# Patient Record
Sex: Male | Born: 1955 | Race: White | Hispanic: No | Marital: Married | Smoking: Former smoker
Health system: Southern US, Community
[De-identification: ages and names within clinical notes are randomized; demographics above are authoritative.]

## PROBLEM LIST (undated history)

## (undated) DIAGNOSIS — I2510 Atherosclerotic heart disease of native coronary artery without angina pectoris: Secondary | ICD-10-CM

## (undated) DIAGNOSIS — R569 Unspecified convulsions: Secondary | ICD-10-CM

## (undated) DIAGNOSIS — I509 Heart failure, unspecified: Secondary | ICD-10-CM

## (undated) HISTORY — DX: Unspecified convulsions: R56.9

## (undated) HISTORY — PX: ELBOW FRACTURE SURGERY: SHX616

## (undated) HISTORY — PX: OTHER SURGICAL HISTORY: SHX169

---
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
## 1997-11-04 ENCOUNTER — Emergency Department (HOSPITAL_COMMUNITY)
Admission: EM | Admit: 1997-11-04 | Discharge: 1997-11-04 | Payer: PRIVATE HEALTH INSURANCE | Admitting: Emergency Medicine

## 2004-01-19 DIAGNOSIS — I219 Acute myocardial infarction, unspecified: Secondary | ICD-10-CM

## 2004-01-19 HISTORY — DX: Acute myocardial infarction, unspecified: I21.9

## 2004-01-19 HISTORY — PX: CORONARY STENT INTERVENTION: CATH118234

## 2004-05-18 ENCOUNTER — Encounter: Payer: Self-pay | Admitting: Emergency Medicine

## 2004-05-18 ENCOUNTER — Inpatient Hospital Stay (HOSPITAL_COMMUNITY): Admission: EM | Admit: 2004-05-18 | Discharge: 2004-05-22 | Payer: Self-pay | Admitting: Cardiology

## 2004-05-18 ENCOUNTER — Ambulatory Visit: Admission: RE | Admit: 2004-05-18 | Discharge: 2004-05-18 | Payer: Self-pay | Admitting: Cardiology

## 2015-01-19 DIAGNOSIS — G40109 Localization-related (focal) (partial) symptomatic epilepsy and epileptic syndromes with simple partial seizures, not intractable, without status epilepticus: Secondary | ICD-10-CM

## 2015-01-19 HISTORY — DX: Localization-related (focal) (partial) symptomatic epilepsy and epileptic syndromes with simple partial seizures, not intractable, without status epilepticus: G40.109

## 2015-05-05 ENCOUNTER — Telehealth: Payer: Self-pay | Admitting: Diagnostic Neuroimaging

## 2015-05-05 NOTE — Telephone Encounter (Signed)
Dr. Ronnald Ramp called in requesting new consult for patient; 2-3 months memory loss and tremors. I agreed to see patient this week (Friday) or next week. Dr. Ronnald Ramp will send in referral.  Penni Bombard, MD [id number], 0000000 PM Certified in Neurology, Neurophysiology and Neuroimaging  [facility] [address], [place], [place] [address] [phone]

## 2015-05-13 ENCOUNTER — Encounter: Payer: Self-pay | Admitting: *Deleted

## 2015-05-14 ENCOUNTER — Encounter: Payer: Self-pay | Admitting: Diagnostic Neuroimaging

## 2015-05-14 ENCOUNTER — Ambulatory Visit (INDEPENDENT_AMBULATORY_CARE_PROVIDER_SITE_OTHER): Payer: No Typology Code available for payment source | Admitting: Diagnostic Neuroimaging

## 2015-05-14 VITALS — BP 146/94 | HR 78 | Resp 16 | Ht 72.0 in | Wt 201.0 lb

## 2015-05-14 DIAGNOSIS — R413 Other amnesia: Secondary | ICD-10-CM

## 2015-05-14 DIAGNOSIS — R404 Transient alteration of awareness: Secondary | ICD-10-CM | POA: Diagnosis not present

## 2015-05-14 DIAGNOSIS — G250 Essential tremor: Secondary | ICD-10-CM

## 2015-05-14 NOTE — Patient Instructions (Signed)
Thank you for coming to see [place] at [facility]. I hope we have been able to provide you high quality care today.  You may receive a patient satisfaction survey over the next few weeks. We would appreciate your feedback and comments so that we may continue to improve ourselves and the health of our patients.  - I will check MRI brain and EEG  - No driving until further testing and clearance  - Please maintain seizure precautions. Do not participate in activities where a loss of awareness could harm you or someone else. No swimming alone, no tub bathing, no hot tubs, no driving, no operating motorized vehicles (cars, ATVs, motocycles, etc), lawnmowers or power tools. No standing at heights, such as rooftops, ladders or stairs. Avoid hot objects such as stoves, heaters, open fires. Wear a helmet when riding a bicycle, scooter, skateboard, etc. and avoid areas of traffic. Set your water heater to 120 degrees or less.    ~~~~~~~~~~~~~~~~~~~~~~~~~~~~~~~~~~~~~~~~~~~~~~~~~~~~~~~~~~~~~~~~~  DR. PENUMALLI'S GUIDE TO HAPPY AND HEALTHY LIVING These are some of my general health and wellness recommendations. Some of them may apply to you better than others. Please use common sense as you try these suggestions and feel free to ask me any questions.   ACTIVITY/FITNESS Mental, social, emotional and physical stimulation are very important for brain and body health. Try learning a new activity (arts, music, language, sports, games).  Keep moving your body to the best of your abilities. You can do this at home, inside or outside, the park, community center, gym or anywhere you like. Consider a physical therapist or personal trainer to get started. Consider the app Sworkit. Fitness trackers such as smart-watches, smart-phones or Fitbits can help as well.   NUTRITION Eat more plants: colorful vegetables, nuts, seeds and berries.  Eat less sugar, salt, preservatives and processed  foods.  Avoid toxins such as cigarettes and alcohol.  Drink water when you are thirsty. Warm water with a slice of lemon is an excellent morning drink to start the day.  Consider these websites for more information The Nutrition Source (https://www.henry-hernandez.biz/) Precision Nutrition (WindowBlog.ch)   RELAXATION Consider practicing mindfulness meditation or other relaxation techniques such as deep breathing, prayer, yoga, tai chi, massage. See website mindful.org or the apps Headspace or Calm to help get started.   SLEEP Try to get at least 7-8+ hours sleep per day. Regular exercise and reduced caffeine will help you sleep better. Practice good sleep hygeine techniques. See website sleep.org for more information.   PLANNING Prepare estate planning, living will, healthcare POA documents. Sometimes this is best planned with the help of an attorney. Theconversationproject.org and agingwithdignity.org are excellent resources.
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
## 2015-05-28 ENCOUNTER — Ambulatory Visit
Admission: RE | Admit: 2015-05-28 | Discharge: 2015-05-28 | Disposition: A | Discharge status: Home / Self Care | Payer: Self-pay | Source: Ambulatory Visit | Attending: Diagnostic Neuroimaging | Admitting: Diagnostic Neuroimaging

## 2015-05-28 DIAGNOSIS — R413 Other amnesia: Secondary | ICD-10-CM | POA: Diagnosis not present

## 2015-05-28 DIAGNOSIS — G250 Essential tremor: Secondary | ICD-10-CM | POA: Diagnosis not present

## 2015-05-28 DIAGNOSIS — R404 Transient alteration of awareness: Secondary | ICD-10-CM

## 2015-05-28 MED ORDER — GADOBENATE DIMEGLUMINE 529 MG/ML IV SOLN
19.0000 mL | Freq: Once | INTRAVENOUS | Status: AC | PRN
  Administered 2015-05-28: 19 mL via INTRAVENOUS

## 2015-06-10 ENCOUNTER — Other Ambulatory Visit: Payer: No Typology Code available for payment source

## 2015-06-11 ENCOUNTER — Encounter: Payer: Self-pay | Admitting: Diagnostic Neuroimaging

## 2015-06-12 ENCOUNTER — Telehealth: Payer: Self-pay | Admitting: Diagnostic Neuroimaging

## 2015-06-12 NOTE — Telephone Encounter (Signed)
Pt was scheduled to have his EEG on 5/23 but the machine was down.  The first available appt is not until 6/19.  Pt has a f/u appt with you 5/31.  His wife wants to know if the order for the EEG needs to be sent to another facility or if the 6/19 appt is okay.  Also do they need to r/s the 5/31 appt until after the EEG has been completed.  The patient had another seizure yesterday per his wife and she is concerned.  Please advise.

## 2015-06-12 NOTE — Telephone Encounter (Signed)
We have machine now. Ask lorraine if it can be done here sooner. -VRP

## 2015-06-13 NOTE — Telephone Encounter (Signed)
EEG scheduled for 06/20/15.  VM reminder left for patient.

## 2015-06-18 ENCOUNTER — Ambulatory Visit: Payer: No Typology Code available for payment source | Admitting: Diagnostic Neuroimaging

## 2015-06-20 ENCOUNTER — Ambulatory Visit (INDEPENDENT_AMBULATORY_CARE_PROVIDER_SITE_OTHER): Payer: No Typology Code available for payment source | Admitting: Diagnostic Neuroimaging

## 2015-06-20 DIAGNOSIS — R410 Disorientation, unspecified: Secondary | ICD-10-CM | POA: Diagnosis not present

## 2015-06-20 DIAGNOSIS — R404 Transient alteration of awareness: Secondary | ICD-10-CM

## 2015-06-20 DIAGNOSIS — R413 Other amnesia: Secondary | ICD-10-CM

## 2015-06-20 DIAGNOSIS — G250 Essential tremor: Secondary | ICD-10-CM

## 2015-06-25 ENCOUNTER — Encounter: Payer: Self-pay | Admitting: Diagnostic Neuroimaging

## 2015-06-25 ENCOUNTER — Ambulatory Visit (INDEPENDENT_AMBULATORY_CARE_PROVIDER_SITE_OTHER): Payer: No Typology Code available for payment source | Admitting: Diagnostic Neuroimaging

## 2015-06-25 VITALS — BP 146/91 | HR 109 | Ht 72.0 in | Wt 192.2 lb

## 2015-06-25 DIAGNOSIS — G40109 Localization-related (focal) (partial) symptomatic epilepsy and epileptic syndromes with simple partial seizures, not intractable, without status epilepticus: Secondary | ICD-10-CM

## 2015-06-25 DIAGNOSIS — G40209 Localization-related (focal) (partial) symptomatic epilepsy and epileptic syndromes with complex partial seizures, not intractable, without status epilepticus: Secondary | ICD-10-CM

## 2015-06-25 MED ORDER — LEVETIRACETAM 500 MG PO TABS: 500.0000 mg | ORAL_TABLET | Freq: Two times a day (BID) | ORAL | Status: DC

## 2015-06-25 NOTE — Procedures (Signed)
   [facility]  EEG (ELECTROENCEPHALOGRAM) REPORT   STUDY DATE: 06/20/15 PATIENT NAME: Tony Saunders DOB: December 26, 1955 MRN: [id number]  ORDERING CLINICIAN: Andrey Spearman, MD   TECHNOLOGIST: Laretta Alstrom  TECHNIQUE: Electroencephalogram was recorded utilizing standard 10-20 system of lead placement and reformatted into average and bipolar montages.  RECORDING TIME: 30 minutes  ACTIVATION: hyperventilation  CLINICAL INFORMATION: 60 year old male with memory lapses, tremors, staring spells.  FINDINGS: Background rhythms of 9-10 hertz and 30-40 microvolts. Intermittent right temporal sharp waves (F8, T4, T6) with phase reversals at F8 are noted. No other focal, lateralizing, epileptiform activity or seizures are seen. Patient recorded in the awake and drowsy state. EKG channel shows regular rhythm of 90-100 beats per minute.   IMPRESSION:  Abnormal EEG in the awake and drowsy states demonstrating: - Intermittent right temporal sharp waves (F8, T4, T6) with phase reversals at F8 are noted. These are potentially epileptiform discharges and can be associated with increased tendency for seizures.       INTERPRETING PHYSICIAN:  Penni Bombard, MD Certified in Neurology, Neurophysiology and Neuroimaging  [facility] [address], [place], [place] [address] [phone]
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
## 2015-06-25 NOTE — Patient Instructions (Signed)
-   start levetiracetam 500mg  twice a day - no driving until event free x 6 months

## 2015-07-07 ENCOUNTER — Other Ambulatory Visit: Payer: No Typology Code available for payment source

## 2015-07-15 ENCOUNTER — Telehealth: Payer: Self-pay | Admitting: Diagnostic Neuroimaging

## 2015-07-15 NOTE — Telephone Encounter (Signed)
Error

## 2015-07-15 NOTE — Telephone Encounter (Signed)
Per Dr Leta Baptist, spoke with wife and informed her that patient should increase Keppra to 100 mg twice a day. She stated they are driving to ED, but stated her husband didn't want to go. Inquired as to type of seizures he had. She stated "they are exactly the same as the others." Advised her that he may increase medication and not go to ED unless seizures continue. Also informed her Dr Leta Baptist wants to see him in FU within a week. She stated that he was "much happier with that plan". She stated they were going back home, not to ED and he would take Keppra 1000 mg twice a day, every day. She stated he does not need refill at this time. Informed her this RN will call her tomorrow with his FU appointment. She stated if he continued to have seizures, she would take him to the ED. She verbalized understanding, agreement, appreciation of call.

## 2015-07-15 NOTE — Telephone Encounter (Addendum)
Wife [name] [phone] called to advise husband had seizure Sunday, yesterday and 2 seizures today. Has been taking Keppra for 3 weeks now. Skyped nurse Leilani Able., advised spouse per nurse, to proceed to ED. Wife will have Son take patient to [facility], she is at work and can't leave right now.

## 2015-07-16 ENCOUNTER — Telehealth: Payer: Self-pay | Admitting: *Deleted

## 2015-07-16 MED ORDER — LEVETIRACETAM 500 MG PO TABS: 1000.0000 mg | ORAL_TABLET | Freq: Two times a day (BID) | ORAL | Status: DC

## 2015-07-16 NOTE — Telephone Encounter (Signed)
error 

## 2015-07-16 NOTE — Telephone Encounter (Signed)
Spoke with patient's wife and gave her follow up appointment date/time on 07/23/15. She requested refill on medication due to Dr Leta Baptist increasing his dose yesterday evening. Confirmed pharmacy and advised her it will be sent. She verbalized understanding, appreciation of all above conversation.

## 2015-07-23 ENCOUNTER — Ambulatory Visit (INDEPENDENT_AMBULATORY_CARE_PROVIDER_SITE_OTHER): Payer: No Typology Code available for payment source | Admitting: Diagnostic Neuroimaging

## 2015-07-23 ENCOUNTER — Encounter: Payer: Self-pay | Admitting: Diagnostic Neuroimaging

## 2015-07-23 VITALS — BP 143/88 | HR 100 | Ht 72.0 in | Wt 187.0 lb

## 2015-07-23 DIAGNOSIS — G40209 Localization-related (focal) (partial) symptomatic epilepsy and epileptic syndromes with complex partial seizures, not intractable, without status epilepticus: Secondary | ICD-10-CM

## 2015-07-23 DIAGNOSIS — R413 Other amnesia: Secondary | ICD-10-CM | POA: Diagnosis not present

## 2015-07-23 DIAGNOSIS — G40109 Localization-related (focal) (partial) symptomatic epilepsy and epileptic syndromes with simple partial seizures, not intractable, without status epilepticus: Secondary | ICD-10-CM

## 2015-07-23 DIAGNOSIS — G250 Essential tremor: Secondary | ICD-10-CM

## 2015-07-23 MED ORDER — LEVETIRACETAM 750 MG PO TABS: 1500.0000 mg | ORAL_TABLET | Freq: Two times a day (BID) | ORAL | Status: DC
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
## 2015-07-23 NOTE — Patient Instructions (Signed)
-   increase levetiracetam to 1500mg  twice a day

## 2015-07-25 ENCOUNTER — Encounter: Payer: Self-pay | Admitting: Diagnostic Neuroimaging

## 2015-07-28 ENCOUNTER — Telehealth: Payer: Self-pay | Admitting: *Deleted

## 2015-07-28 NOTE — Telephone Encounter (Signed)
Continue current dose of LEV. -VRP

## 2015-07-28 NOTE — Telephone Encounter (Signed)
Spoke with patient's wife re: my chart message, pt had 2 seizures, the same as the ones before,  on 07/25/15 just 2 days after increasing levetiracetam to 1500 mg twice daily, per Dr Leta Baptist. She stated to her knowledge he has not had any more seizures since 07/25/15. Advised that it may have been too soon after increasing medication for his blood level to reach therapeutic dose, however this RN stated she will notify Dr Leta Baptist. Wife stated Dr Leta Baptist discussed another seizure medication, and she also questioned if neurontin would be helpful. She stated a friend of hers who has seizures suggested neurontin. She stated if her husband has 2 seizures in a day she will contact office again. This RN did notify her Dr Leta Baptist will be out of office x 2 weeks as of 07/31/15, however their will be provider to care for her husband's medication needs if necessary.  She verbalized understanding, appreciation.

## 2015-07-28 NOTE — Telephone Encounter (Signed)
Spoke with wife and informed her that Dr Leta Baptist stated for her husband to continue current dose of LEV. She verbalized understanding, appreciation for call back.

## 2015-09-26 ENCOUNTER — Encounter: Payer: Self-pay | Admitting: Diagnostic Neuroimaging

## 2015-09-26 ENCOUNTER — Ambulatory Visit (INDEPENDENT_AMBULATORY_CARE_PROVIDER_SITE_OTHER): Payer: No Typology Code available for payment source | Admitting: Diagnostic Neuroimaging

## 2015-09-26 VITALS — BP 133/88 | HR 96

## 2015-09-26 DIAGNOSIS — R413 Other amnesia: Secondary | ICD-10-CM

## 2015-09-26 DIAGNOSIS — F411 Generalized anxiety disorder: Secondary | ICD-10-CM | POA: Diagnosis not present

## 2015-09-26 DIAGNOSIS — G40209 Localization-related (focal) (partial) symptomatic epilepsy and epileptic syndromes with complex partial seizures, not intractable, without status epilepticus: Secondary | ICD-10-CM

## 2015-09-26 DIAGNOSIS — G250 Essential tremor: Secondary | ICD-10-CM | POA: Diagnosis not present

## 2015-09-26 DIAGNOSIS — G40109 Localization-related (focal) (partial) symptomatic epilepsy and epileptic syndromes with simple partial seizures, not intractable, without status epilepticus: Secondary | ICD-10-CM

## 2015-09-26 MED ORDER — LAMOTRIGINE 25 MG PO TABS: ORAL_TABLET | ORAL | 4 refills | Status: DC

## 2015-09-26 MED ORDER — LEVETIRACETAM 750 MG PO TABS: 1500.0000 mg | ORAL_TABLET | Freq: Two times a day (BID) | ORAL | 12 refills | Status: DC
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
## 2015-09-26 NOTE — Patient Instructions (Addendum)
-   add lamotrigine 25mg  twice a day; gradually increase to 100mg  twice a day  - continue LEV 1500mg  twice a day for seizure control; may need to reduce in future if mood is not improving

## 2015-11-12 ENCOUNTER — Telehealth: Payer: Self-pay | Admitting: Diagnostic Neuroimaging

## 2015-11-12 NOTE — Telephone Encounter (Signed)
Wife called to request note for jury duty, states husband is not supposed to drive, she drives him everywhere and has been called for jury duty October 30th. Please call to advise [phone].

## 2015-11-12 NOTE — Telephone Encounter (Signed)
Spoke with wife and advised her to contact the courthouse for proper documentation needed to release patient from jury duty. She stated she would look into it, call back if needed. She verbalized understanding, appreciation for call.

## 2015-12-22 ENCOUNTER — Telehealth: Payer: Self-pay | Admitting: Diagnostic Neuroimaging

## 2015-12-22 NOTE — Telephone Encounter (Signed)
Patient's wife is calling. The patient has an appointment on 12-26-15 and is wondering if lab work will be done that day. If lab work is not done the patient would like to cancel. Please call and discuss.

## 2015-12-22 NOTE — Telephone Encounter (Signed)
Spoke with wife who stated patient has just recently increased lamotrogine to 100 mg twice a day. She stated his mood changes often; he says things to her he would have never said in the past.  She stated Dr Leta Baptist advised them that this new medication will require regular labs to monitor. She is asking if Dr Leta Baptist is going to order labs. Otherwise she wanted to know if she can just communicate with him via My Chart. This RN advised that Dr Leta Baptist will want to see patient regardless of labs because he ordered a new medication, and patient is having worsening symptoms.  Wife stated they would come in; this RN stated she would route this note to Dr Leta Baptist as Juluis Rainier. She verbalized understanding, appreciation.

## 2015-12-26 ENCOUNTER — Encounter: Payer: Self-pay | Admitting: Diagnostic Neuroimaging

## 2015-12-26 ENCOUNTER — Ambulatory Visit (INDEPENDENT_AMBULATORY_CARE_PROVIDER_SITE_OTHER): Payer: No Typology Code available for payment source | Admitting: Diagnostic Neuroimaging

## 2015-12-26 VITALS — BP 132/84 | HR 90 | Wt 166.2 lb

## 2015-12-26 DIAGNOSIS — G40909 Epilepsy, unspecified, not intractable, without status epilepticus: Secondary | ICD-10-CM

## 2015-12-26 DIAGNOSIS — G250 Essential tremor: Secondary | ICD-10-CM | POA: Diagnosis not present

## 2015-12-26 DIAGNOSIS — F329 Major depressive disorder, single episode, unspecified: Secondary | ICD-10-CM | POA: Diagnosis not present

## 2015-12-26 DIAGNOSIS — F32A Depression, unspecified: Secondary | ICD-10-CM

## 2015-12-26 DIAGNOSIS — F411 Generalized anxiety disorder: Secondary | ICD-10-CM

## 2015-12-26 MED ORDER — LEVETIRACETAM 750 MG PO TABS: 750.0000 mg | ORAL_TABLET | Freq: Two times a day (BID) | ORAL | 12 refills | Status: DC

## 2015-12-26 MED ORDER — LAMOTRIGINE 100 MG PO TABS: 100.0000 mg | ORAL_TABLET | Freq: Two times a day (BID) | ORAL | 12 refills | Status: DC

## 2015-12-26 NOTE — Patient Instructions (Signed)
-   check lab testing today  - reduce levetiracetam to 750mg  twice a day (due to mood changes)  - continue lamotrigine 100mg  twice a day  - consider psychiatry/psychology evaluation for depression and mood swings  - no driving until event free x 6 months (last seizure ~11/26/15)
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
## 2015-12-30 LAB — CBC WITH DIFFERENTIAL/PLATELET
BASOS ABS: 0 10*3/uL (ref 0.0–0.2)
Basos: 0 %
EOS (ABSOLUTE): 0.1 10*3/uL (ref 0.0–0.4)
Eos: 1 %
HEMATOCRIT: 45.2 % (ref 37.5–51.0)
Hemoglobin: 15.4 g/dL (ref 13.0–17.7)
Immature Grans (Abs): 0 10*3/uL (ref 0.0–0.1)
Immature Granulocytes: 0 %
LYMPHS ABS: 1.5 10*3/uL (ref 0.7–3.1)
Lymphs: 23 %
MCH: 30.9 pg (ref 26.6–33.0)
MCHC: 34.1 g/dL (ref 31.5–35.7)
MCV: 91 fL (ref 79–97)
MONOS ABS: 0.6 10*3/uL (ref 0.1–0.9)
Monocytes: 9 %
Neutrophils Absolute: 4.3 10*3/uL (ref 1.4–7.0)
Neutrophils: 67 %
PLATELETS: 215 10*3/uL (ref 150–379)
RBC: 4.99 x10E6/uL (ref 4.14–5.80)
RDW: 13.2 % (ref 12.3–15.4)
WBC: 6.4 10*3/uL (ref 3.4–10.8)

## 2015-12-30 LAB — COMPREHENSIVE METABOLIC PANEL
ALK PHOS: 98 IU/L (ref 39–117)
ALT: 23 IU/L (ref 0–44)
AST: 30 IU/L (ref 0–40)
Albumin/Globulin Ratio: 2.1 (ref 1.2–2.2)
Albumin: 4.5 g/dL (ref 3.5–5.5)
BUN/Creatinine Ratio: 10 (ref 9–20)
BUN: 9 mg/dL (ref 6–24)
Bilirubin Total: 0.5 mg/dL (ref 0.0–1.2)
CO2: 24 mmol/L (ref 18–29)
CREATININE: 0.9 mg/dL (ref 0.76–1.27)
Calcium: 9.2 mg/dL (ref 8.7–10.2)
Chloride: 103 mmol/L (ref 96–106)
GFR calc Af Amer: 108 mL/min/{1.73_m2} (ref >59)
GFR calc non Af Amer: 93 mL/min/{1.73_m2} (ref >59)
GLOBULIN, TOTAL: 2.1 g/dL (ref 1.5–4.5)
GLUCOSE: 90 mg/dL (ref 65–99)
Potassium: 5.4 mmol/L — ABNORMAL HIGH (ref 3.5–5.2)
SODIUM: 140 mmol/L (ref 134–144)
Total Protein: 6.6 g/dL (ref 6.0–8.5)

## 2015-12-30 LAB — LAMOTRIGINE LEVEL: LAMOTRIGINE LVL: 6.4 ug/mL (ref 2.0–20.0)

## 2016-01-05 ENCOUNTER — Telehealth: Payer: Self-pay | Admitting: *Deleted

## 2016-01-05 NOTE — Telephone Encounter (Signed)
Per Dr Leta Baptist, LVM informing patient his lab results are unremarkable. Dr Leta Baptist will continue with his current treatment plan. Reviewed Dr Gladstone Lighter plan per last office visit note. Left number for any questions.

## 2016-03-26 ENCOUNTER — Ambulatory Visit: Payer: No Typology Code available for payment source | Admitting: Diagnostic Neuroimaging

## 2016-03-30 ENCOUNTER — Ambulatory Visit (INDEPENDENT_AMBULATORY_CARE_PROVIDER_SITE_OTHER): Payer: No Typology Code available for payment source | Admitting: Diagnostic Neuroimaging

## 2016-03-30 ENCOUNTER — Encounter: Payer: Self-pay | Admitting: Diagnostic Neuroimaging

## 2016-03-30 VITALS — BP 141/86 | HR 75 | Ht 72.0 in | Wt 166.6 lb

## 2016-03-30 DIAGNOSIS — F411 Generalized anxiety disorder: Secondary | ICD-10-CM

## 2016-03-30 DIAGNOSIS — R413 Other amnesia: Secondary | ICD-10-CM | POA: Diagnosis not present

## 2016-03-30 DIAGNOSIS — G250 Essential tremor: Secondary | ICD-10-CM | POA: Diagnosis not present

## 2016-03-30 DIAGNOSIS — G40909 Epilepsy, unspecified, not intractable, without status epilepticus: Secondary | ICD-10-CM

## 2016-03-30 MED ORDER — LEVETIRACETAM 750 MG PO TABS: 750.0000 mg | ORAL_TABLET | Freq: Two times a day (BID) | ORAL | 4 refills | Status: DC

## 2016-03-30 MED ORDER — LAMOTRIGINE 100 MG PO TABS: 100.0000 mg | ORAL_TABLET | Freq: Two times a day (BID) | ORAL | 4 refills | Status: DC

## 2016-03-30 NOTE — Patient Instructions (Addendum)
-   continue lamotrigine 100mg  twice a day  - continue levetiracetam 750mg  twice a day  - in future may consider trial of essential tremor medication; would consider propranolol (which can help with BP and tremor)  - no driving until event free x 6 months (last seizure ~11/26/15)  - maintain seizure precautions. Do not participate in activities where a loss of awareness could harm you or someone else. No swimming alone, no tub bathing, no hot tubs, no driving, no operating motorized vehicles (cars, ATVs, motocycles, etc), lawnmowers or power tools. No standing at heights, such as rooftops, ladders or stairs. Avoid hot objects such as stoves, heaters, open fires. Wear a helmet when riding a bicycle, scooter, skateboard, etc. and avoid areas of traffic. Set your water heater to 120 degrees or less.
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
## 2016-10-04 ENCOUNTER — Encounter: Payer: Self-pay | Admitting: Diagnostic Neuroimaging

## 2016-10-04 ENCOUNTER — Ambulatory Visit (INDEPENDENT_AMBULATORY_CARE_PROVIDER_SITE_OTHER): Payer: No Typology Code available for payment source | Admitting: Diagnostic Neuroimaging

## 2016-10-04 VITALS — BP 117/76 | HR 80 | Ht 72.0 in | Wt 160.4 lb

## 2016-10-04 DIAGNOSIS — F411 Generalized anxiety disorder: Secondary | ICD-10-CM

## 2016-10-04 DIAGNOSIS — F32A Depression, unspecified: Secondary | ICD-10-CM

## 2016-10-04 DIAGNOSIS — F329 Major depressive disorder, single episode, unspecified: Secondary | ICD-10-CM | POA: Diagnosis not present

## 2016-10-04 DIAGNOSIS — G40909 Epilepsy, unspecified, not intractable, without status epilepticus: Secondary | ICD-10-CM | POA: Diagnosis not present

## 2016-10-04 MED ORDER — LEVETIRACETAM 750 MG PO TABS: 750.0000 mg | ORAL_TABLET | Freq: Two times a day (BID) | ORAL | 4 refills | Status: DC

## 2016-10-04 MED ORDER — LAMOTRIGINE 100 MG PO TABS: 100.0000 mg | ORAL_TABLET | Freq: Two times a day (BID) | ORAL | 4 refills | Status: DC

## 2016-10-04 MED ORDER — SERTRALINE HCL 25 MG PO TABS: 25.0000 mg | ORAL_TABLET | Freq: Every day | ORAL | 12 refills | Status: DC
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
## 2016-10-04 NOTE — Patient Instructions (Signed)
-   start sertraline 25mg  daily for mood stabilization  - continue levetiracetam and lamotrigine  - start exercise program (2-3 times per week; 30-60 minutes)

## 2017-02-22 ENCOUNTER — Encounter: Payer: Self-pay | Admitting: Diagnostic Neuroimaging

## 2017-02-28 ENCOUNTER — Telehealth: Payer: Self-pay | Admitting: Diagnostic Neuroimaging

## 2017-02-28 MED ORDER — SERTRALINE HCL 25 MG PO TABS: 25.0000 mg | ORAL_TABLET | Freq: Every day | ORAL | 1 refills | Status: DC

## 2017-02-28 NOTE — Addendum Note (Signed)
Addended by: Minna Antis on: 02/28/2017 09:18 AM   Modules accepted: Orders

## 2017-02-28 NOTE — Telephone Encounter (Signed)
Wife requested 90 day supply of Sertraline. Refill sent for 90 days with 1 additional refill. Patient has FU in July.

## 2017-02-28 NOTE — Telephone Encounter (Signed)
Patient's wife requesting 3 month supply of sertraline (ZOLOFT) 25 MG tablet called to [facility] [address].

## 2017-07-04 ENCOUNTER — Ambulatory Visit: Payer: No Typology Code available for payment source | Admitting: Diagnostic Neuroimaging

## 2017-08-15 ENCOUNTER — Ambulatory Visit: Payer: No Typology Code available for payment source | Admitting: Diagnostic Neuroimaging

## 2017-10-12 ENCOUNTER — Other Ambulatory Visit: Payer: Self-pay | Admitting: Diagnostic Neuroimaging

## 2017-10-12 NOTE — Telephone Encounter (Signed)
Refilled x 3 months with note to pharmacy: patient must schedule Follow up. Last seen 10/05/16.

## 2017-12-11 ENCOUNTER — Other Ambulatory Visit: Payer: Self-pay | Admitting: Diagnostic Neuroimaging

## 2018-01-29 ENCOUNTER — Other Ambulatory Visit: Payer: Self-pay | Admitting: Diagnostic Neuroimaging

## 2018-02-03 ENCOUNTER — Other Ambulatory Visit: Payer: Self-pay | Admitting: Diagnostic Neuroimaging

## 2018-02-03 ENCOUNTER — Telehealth: Payer: Self-pay | Admitting: Diagnostic Neuroimaging

## 2018-02-03 NOTE — Telephone Encounter (Signed)
Pt. not seen since 10/04/16. Did not come to sched. f/u in July 2019.No pending appt.  I explained to wife that he needs to be seen before meds can be r/f. Appt. scheduled and she will check with pt's pcp for r/f of meds until then/fim

## 2018-02-03 NOTE — Telephone Encounter (Signed)
Pts wife Tony Saunders (on [place]) states that [facility] on [address] has been trying to get in touch with [place] regarding pts sertraline (ZOLOFT) 25 MG tablet. Wife states that the pharmacy is waiting to hear from [place] to be able to refill this medication. Please advise.

## 2018-03-04 ENCOUNTER — Other Ambulatory Visit: Payer: Self-pay | Admitting: Diagnostic Neuroimaging

## 2018-05-15 ENCOUNTER — Telehealth: Payer: Self-pay | Admitting: *Deleted

## 2018-05-15 NOTE — Telephone Encounter (Signed)
Called patient, spoke with wife who took phone to patient. I advised him due to current COVID 19 pandemic, our office is severely reducing in person visits in order to minimize the risk to our patients and healthcare providers. We recommend to convert your appointment to a video visit. We'll take all precautions to reduce any security or privacy concerns. This will be treated like an office visit, and we will file with your insurance. He consented to video visit and moving appt sooner. Pt's email is [email or web address]. He then gave phone to wife. She understands that the [employer] webex software must be downloaded and operational on the device pt plans to use for the visit.  I advised her to call for any problems, questions prior to video visit. We updated  EMR. She then stated he has been out of his seizure medications for about 1 month.  She is unaware of any seizure activity but stated he "wouldn't tell her anyway".  She stated it was refused when he last requested a refill because he hadn't been seen in > 1 year. I apologized to her, advised her they can be refilled. She  verbalized understanding, appreciation.

## 2018-05-16 MED ORDER — LEVETIRACETAM 750 MG PO TABS: 750.0000 mg | ORAL_TABLET | Freq: Two times a day (BID) | ORAL | 3 refills | Status: DC

## 2018-05-16 MED ORDER — SERTRALINE HCL 25 MG PO TABS: 25.0000 mg | ORAL_TABLET | Freq: Every day | ORAL | 3 refills | Status: DC

## 2018-05-16 MED ORDER — LAMOTRIGINE 100 MG PO TABS: 100.0000 mg | ORAL_TABLET | Freq: Two times a day (BID) | ORAL | 1 refills | Status: DC

## 2018-05-16 NOTE — Telephone Encounter (Signed)
Patient's medications: levetiracetam, Sertraline, lamotrigine refilled per Dr Gladstone Lighter verbal order. Patient has video visit on Wed to follow  Up.

## 2018-05-16 NOTE — Telephone Encounter (Signed)
Spoke with wife and informed her Dr Leta Baptist is transitioning to easier web site for video visit. She agreed, advised her she will get a new e mail with instructions. She  verbalized understanding, appreciation. New e mail sent.

## 2018-05-17 ENCOUNTER — Other Ambulatory Visit: Payer: Self-pay

## 2018-05-17 ENCOUNTER — Encounter: Payer: Self-pay | Admitting: Diagnostic Neuroimaging

## 2018-05-17 ENCOUNTER — Ambulatory Visit (INDEPENDENT_AMBULATORY_CARE_PROVIDER_SITE_OTHER): Payer: No Typology Code available for payment source | Admitting: Diagnostic Neuroimaging

## 2018-05-17 DIAGNOSIS — F411 Generalized anxiety disorder: Secondary | ICD-10-CM

## 2018-05-17 DIAGNOSIS — F32A Depression, unspecified: Secondary | ICD-10-CM

## 2018-05-17 DIAGNOSIS — G40909 Epilepsy, unspecified, not intractable, without status epilepticus: Secondary | ICD-10-CM | POA: Diagnosis not present

## 2018-05-17 DIAGNOSIS — F329 Major depressive disorder, single episode, unspecified: Secondary | ICD-10-CM

## 2018-05-17 MED ORDER — LEVETIRACETAM 750 MG PO TABS: 750.0000 mg | ORAL_TABLET | Freq: Two times a day (BID) | ORAL | 3 refills | Status: DC

## 2018-05-17 MED ORDER — SERTRALINE HCL 25 MG PO TABS: 25.0000 mg | ORAL_TABLET | Freq: Every day | ORAL | 3 refills | Status: DC

## 2018-05-17 MED ORDER — LAMOTRIGINE 100 MG PO TABS: 100.0000 mg | ORAL_TABLET | Freq: Two times a day (BID) | ORAL | 3 refills | Status: DC

## 2018-05-17 NOTE — Progress Notes (Signed)
    Virtual Visit via Video Note  I connected with Myrle Sheng on 05/17/18 at 11:00 AM EDT by a video enabled telemedicine application and verified that I am speaking with the correct person using two identifiers.   I discussed the limitations of evaluation and management by telemedicine and the availability of in person appointments. The patient expressed understanding and agreed to proceed.  Patient is at their home. I am at the office.    History of Present Illness:  - patient is doing well - no seizures; tolerating meds - tremor is improved - mood is stable    Observations/Objective:  - awake and alert - face symm - no dysarthria - no tremor    Assessment and Plan:  SEIZURE DISORDER - continue lamotrigine 100mg  twice a day - continue levetiracetam 750mg  twice a day - check CBC, CMP with PCP annually  AGITATION / MOOD DISTURBANCE - continue sertraline 25mg  daily  ESSENTIAL TREMOR - in future may consider trial of essential tremor medication; would consider propranolol (which can help with BP and tremor)   Follow Up Instructions:  - Return in about 1 year (around 05/17/2019).    I discussed the assessment and treatment plan with the patient. The patient was provided an opportunity to ask questions and all were answered. The patient agreed with the plan and demonstrated an understanding of the instructions.   The patient was advised to call back or seek an in-person evaluation if the symptoms worsen or if the condition fails to improve as anticipated.  I provided 15 minutes of non-face-to-face time during this encounter.   Penni Bombard, MD 9/93/5701, 77:93 AM Certified in Neurology, Neurophysiology and Neuroimaging  [facility] [address] [place], [place] [address] [phone]

## 2018-06-07 ENCOUNTER — Ambulatory Visit: Payer: Self-pay | Admitting: Diagnostic Neuroimaging

## 2019-03-27 ENCOUNTER — Emergency Department (HOSPITAL_COMMUNITY): Payer: PRIVATE HEALTH INSURANCE

## 2019-03-27 ENCOUNTER — Encounter (HOSPITAL_COMMUNITY)
Admission: EM | Disposition: A | Discharge status: Home / Self Care | Payer: Self-pay | Source: Home / Self Care | Attending: Cardiology

## 2019-03-27 ENCOUNTER — Other Ambulatory Visit (HOSPITAL_COMMUNITY): Payer: PRIVATE HEALTH INSURANCE

## 2019-03-27 ENCOUNTER — Encounter (HOSPITAL_COMMUNITY): Payer: Self-pay | Admitting: Emergency Medicine

## 2019-03-27 ENCOUNTER — Other Ambulatory Visit: Payer: Self-pay

## 2019-03-27 ENCOUNTER — Inpatient Hospital Stay (HOSPITAL_COMMUNITY)
Admission: EM | Admit: 2019-03-27 | Discharge: 2019-03-31 | DRG: 246 | Disposition: A | Discharge status: Home / Self Care | Payer: PRIVATE HEALTH INSURANCE | Attending: Cardiology | Admitting: Cardiology

## 2019-03-27 DIAGNOSIS — D649 Anemia, unspecified: Secondary | ICD-10-CM | POA: Diagnosis present

## 2019-03-27 DIAGNOSIS — I2119 ST elevation (STEMI) myocardial infarction involving other coronary artery of inferior wall: Secondary | ICD-10-CM

## 2019-03-27 DIAGNOSIS — G40909 Epilepsy, unspecified, not intractable, without status epilepticus: Secondary | ICD-10-CM | POA: Diagnosis present

## 2019-03-27 DIAGNOSIS — Z79899 Other long term (current) drug therapy: Secondary | ICD-10-CM

## 2019-03-27 DIAGNOSIS — Z8249 Family history of ischemic heart disease and other diseases of the circulatory system: Secondary | ICD-10-CM | POA: Diagnosis not present

## 2019-03-27 DIAGNOSIS — I219 Acute myocardial infarction, unspecified: Secondary | ICD-10-CM | POA: Diagnosis present

## 2019-03-27 DIAGNOSIS — I2510 Atherosclerotic heart disease of native coronary artery without angina pectoris: Secondary | ICD-10-CM

## 2019-03-27 DIAGNOSIS — R21 Rash and other nonspecific skin eruption: Secondary | ICD-10-CM | POA: Diagnosis present

## 2019-03-27 DIAGNOSIS — I2111 ST elevation (STEMI) myocardial infarction involving right coronary artery: Secondary | ICD-10-CM

## 2019-03-27 DIAGNOSIS — E785 Hyperlipidemia, unspecified: Secondary | ICD-10-CM | POA: Diagnosis present

## 2019-03-27 DIAGNOSIS — I25110 Atherosclerotic heart disease of native coronary artery with unstable angina pectoris: Secondary | ICD-10-CM | POA: Diagnosis present

## 2019-03-27 DIAGNOSIS — I213 ST elevation (STEMI) myocardial infarction of unspecified site: Secondary | ICD-10-CM

## 2019-03-27 DIAGNOSIS — I5042 Chronic combined systolic (congestive) and diastolic (congestive) heart failure: Secondary | ICD-10-CM

## 2019-03-27 DIAGNOSIS — I5023 Acute on chronic systolic (congestive) heart failure: Secondary | ICD-10-CM | POA: Diagnosis not present

## 2019-03-27 DIAGNOSIS — R000 Tachycardia, unspecified: Secondary | ICD-10-CM | POA: Diagnosis not present

## 2019-03-27 DIAGNOSIS — I2584 Coronary atherosclerosis due to calcified coronary lesion: Secondary | ICD-10-CM | POA: Diagnosis present

## 2019-03-27 DIAGNOSIS — I959 Hypotension, unspecified: Secondary | ICD-10-CM | POA: Diagnosis not present

## 2019-03-27 DIAGNOSIS — T82855A Stenosis of coronary artery stent, initial encounter: Secondary | ICD-10-CM | POA: Diagnosis present

## 2019-03-27 DIAGNOSIS — I255 Ischemic cardiomyopathy: Secondary | ICD-10-CM | POA: Diagnosis present

## 2019-03-27 DIAGNOSIS — R7401 Elevation of levels of liver transaminase levels: Secondary | ICD-10-CM | POA: Diagnosis present

## 2019-03-27 DIAGNOSIS — I480 Paroxysmal atrial fibrillation: Secondary | ICD-10-CM | POA: Diagnosis not present

## 2019-03-27 DIAGNOSIS — Z20822 Contact with and (suspected) exposure to covid-19: Secondary | ICD-10-CM | POA: Diagnosis present

## 2019-03-27 DIAGNOSIS — Z9861 Coronary angioplasty status: Secondary | ICD-10-CM

## 2019-03-27 DIAGNOSIS — Z955 Presence of coronary angioplasty implant and graft: Secondary | ICD-10-CM

## 2019-03-27 DIAGNOSIS — I340 Nonrheumatic mitral (valve) insufficiency: Secondary | ICD-10-CM | POA: Diagnosis present

## 2019-03-27 HISTORY — DX: Atherosclerotic heart disease of native coronary artery without angina pectoris: Z98.61

## 2019-03-27 HISTORY — DX: Atherosclerotic heart disease of native coronary artery without angina pectoris: I25.10

## 2019-03-27 HISTORY — DX: Chronic combined systolic (congestive) and diastolic (congestive) heart failure: I50.42

## 2019-03-27 HISTORY — PX: CORONARY STENT INTERVENTION: CATH118234

## 2019-03-27 HISTORY — PX: CORONARY/GRAFT ACUTE MI REVASCULARIZATION: CATH118305

## 2019-03-27 HISTORY — DX: ST elevation (STEMI) myocardial infarction involving other coronary artery of inferior wall: I21.19

## 2019-03-27 HISTORY — PX: LEFT HEART CATH AND CORONARY ANGIOGRAPHY: CATH118249

## 2019-03-27 LAB — LIPID PANEL
Cholesterol: 145 mg/dL (ref 0–200)
HDL: 26 mg/dL — ABNORMAL LOW (ref >40)
LDL Cholesterol: 92 mg/dL (ref 0–99)
Total CHOL/HDL Ratio: 5.6 RATIO
Triglycerides: 134 mg/dL (ref <150)
VLDL: 27 mg/dL (ref 0–40)

## 2019-03-27 LAB — CBC WITH DIFFERENTIAL/PLATELET
Abs Immature Granulocytes: 0.1 10*3/uL — ABNORMAL HIGH (ref 0.00–0.07)
Basophils Absolute: 0 10*3/uL (ref 0.0–0.1)
Basophils Relative: 0 %
Eosinophils Absolute: 0 10*3/uL (ref 0.0–0.5)
Eosinophils Relative: 0 %
HCT: 39.8 % (ref 39.0–52.0)
Hemoglobin: 12.7 g/dL — ABNORMAL LOW (ref 13.0–17.0)
Immature Granulocytes: 1 %
Lymphocytes Relative: 15 %
Lymphs Abs: 1.5 10*3/uL (ref 0.7–4.0)
MCH: 28.1 pg (ref 26.0–34.0)
MCHC: 31.9 g/dL (ref 30.0–36.0)
MCV: 88.1 fL (ref 80.0–100.0)
Monocytes Absolute: 0.9 10*3/uL (ref 0.1–1.0)
Monocytes Relative: 9 %
Neutro Abs: 7.5 10*3/uL (ref 1.7–7.7)
Neutrophils Relative %: 75 %
Platelets: 340 10*3/uL (ref 150–400)
RBC: 4.52 MIL/uL (ref 4.22–5.81)
RDW: 13.7 % (ref 11.5–15.5)
WBC: 10 10*3/uL (ref 4.0–10.5)
nRBC: 0 % (ref 0.0–0.2)

## 2019-03-27 LAB — POCT I-STAT, CHEM 8
BUN: 14 mg/dL (ref 8–23)
Calcium, Ion: 1.13 mmol/L — ABNORMAL LOW (ref 1.15–1.40)
Chloride: 104 mmol/L (ref 98–111)
Creatinine, Ser: 0.8 mg/dL (ref 0.61–1.24)
Glucose, Bld: 121 mg/dL — ABNORMAL HIGH (ref 70–99)
HCT: 33 % — ABNORMAL LOW (ref 39.0–52.0)
Hemoglobin: 11.2 g/dL — ABNORMAL LOW (ref 13.0–17.0)
Potassium: 3.9 mmol/L (ref 3.5–5.1)
Sodium: 137 mmol/L (ref 135–145)
TCO2: 23 mmol/L (ref 22–32)

## 2019-03-27 LAB — PROTIME-INR
INR: 1.1 (ref 0.8–1.2)
Prothrombin Time: 14.4 seconds (ref 11.4–15.2)

## 2019-03-27 LAB — RESPIRATORY PANEL BY RT PCR (FLU A&B, COVID)
Influenza A by PCR: NEGATIVE
Influenza B by PCR: NEGATIVE
SARS Coronavirus 2 by RT PCR: NEGATIVE

## 2019-03-27 LAB — COMPREHENSIVE METABOLIC PANEL
ALT: 99 U/L — ABNORMAL HIGH (ref 0–44)
AST: 133 U/L — ABNORMAL HIGH (ref 15–41)
Albumin: 3 g/dL — ABNORMAL LOW (ref 3.5–5.0)
Alkaline Phosphatase: 81 U/L (ref 38–126)
Anion gap: 14 (ref 5–15)
BUN: 13 mg/dL (ref 8–23)
CO2: 22 mmol/L (ref 22–32)
Calcium: 8.9 mg/dL (ref 8.9–10.3)
Chloride: 101 mmol/L (ref 98–111)
Creatinine, Ser: 1.12 mg/dL (ref 0.61–1.24)
GFR calc Af Amer: >60 mL/min (ref >60)
GFR calc non Af Amer: >60 mL/min (ref >60)
Glucose, Bld: 127 mg/dL — ABNORMAL HIGH (ref 70–99)
Potassium: 4.4 mmol/L (ref 3.5–5.1)
Sodium: 137 mmol/L (ref 135–145)
Total Bilirubin: 0.5 mg/dL (ref 0.3–1.2)
Total Protein: 6.1 g/dL — ABNORMAL LOW (ref 6.5–8.1)

## 2019-03-27 LAB — TROPONIN I (HIGH SENSITIVITY)
Troponin I (High Sensitivity): 1560 ng/L (ref <18)
Troponin I (High Sensitivity): >27000 ng/L (ref <18)
Troponin I (High Sensitivity): >27000 ng/L (ref <18)
Troponin I (High Sensitivity): >27000 ng/L (ref <18)

## 2019-03-27 LAB — MRSA PCR SCREENING: MRSA by PCR: NEGATIVE

## 2019-03-27 LAB — HEMOGLOBIN A1C
Hgb A1c MFr Bld: 5.3 % (ref 4.8–5.6)
Mean Plasma Glucose: 105.41 mg/dL

## 2019-03-27 LAB — APTT: aPTT: 30 seconds (ref 24–36)

## 2019-03-27 LAB — POCT ACTIVATED CLOTTING TIME
Activated Clotting Time: 219 seconds
Activated Clotting Time: 296 seconds

## 2019-03-27 LAB — POC SARS CORONAVIRUS 2 AG: SARS Coronavirus 2 Ag: NEGATIVE

## 2019-03-27 SURGERY — LEFT HEART CATH AND CORONARY ANGIOGRAPHY
Anesthesia: LOCAL

## 2019-03-27 MED ORDER — MIDAZOLAM HCL 2 MG/2ML IJ SOLN
INTRAMUSCULAR | Status: DC | PRN
  Administered 2019-03-27: 2 mg via INTRAVENOUS

## 2019-03-27 MED ORDER — SODIUM CHLORIDE 0.9% FLUSH: 3.0000 mL | INTRAVENOUS | Status: DC | PRN

## 2019-03-27 MED ORDER — CHLORHEXIDINE GLUCONATE CLOTH 2 % EX PADS
6.0000 | MEDICATED_PAD | Freq: Every day | CUTANEOUS | Status: DC
  Administered 2019-03-28: 6 via TOPICAL

## 2019-03-27 MED ORDER — TICAGRELOR 90 MG PO TABS
ORAL_TABLET | ORAL | Status: AC
  Filled 2019-03-27: qty 1

## 2019-03-27 MED ORDER — HEPARIN SODIUM (PORCINE) 5000 UNIT/ML IJ SOLN: 60.0000 [IU]/kg | Freq: Once | INTRAMUSCULAR | Status: DC

## 2019-03-27 MED ORDER — TIROFIBAN HCL IN NACL 5-0.9 MG/100ML-% IV SOLN
INTRAVENOUS | Status: AC | PRN
  Administered 2019-03-27: 0.15 ug/kg/min via INTRAVENOUS

## 2019-03-27 MED ORDER — SODIUM CHLORIDE 0.9 % IV SOLN
INTRAVENOUS | Status: AC | PRN
  Administered 2019-03-27: 10 mL/h via INTRAVENOUS

## 2019-03-27 MED ORDER — ASPIRIN 81 MG PO CHEW
81.0000 mg | CHEWABLE_TABLET | Freq: Every day | ORAL | Status: DC
  Administered 2019-03-28 – 2019-03-31 (×4): 81 mg via ORAL
  Filled 2019-03-27 (×4): qty 1

## 2019-03-27 MED ORDER — HYDRALAZINE HCL 20 MG/ML IJ SOLN: 10.0000 mg | INTRAMUSCULAR | Status: AC | PRN

## 2019-03-27 MED ORDER — ONDANSETRON HCL 4 MG/2ML IJ SOLN: 4.0000 mg | Freq: Four times a day (QID) | INTRAMUSCULAR | Status: DC | PRN

## 2019-03-27 MED ORDER — IOHEXOL 350 MG/ML SOLN
INTRAVENOUS | Status: AC
  Filled 2019-03-27: qty 1

## 2019-03-27 MED ORDER — HEPARIN (PORCINE) IN NACL 1000-0.9 UT/500ML-% IV SOLN
INTRAVENOUS | Status: DC | PRN
  Administered 2019-03-27: 500 mL

## 2019-03-27 MED ORDER — SODIUM CHLORIDE 0.9% FLUSH
3.0000 mL | Freq: Two times a day (BID) | INTRAVENOUS | Status: DC
  Administered 2019-03-27 – 2019-03-30 (×5): 3 mL via INTRAVENOUS

## 2019-03-27 MED ORDER — ACETAMINOPHEN 325 MG PO TABS
650.0000 mg | ORAL_TABLET | ORAL | Status: DC | PRN
  Administered 2019-03-30: 650 mg via ORAL
  Filled 2019-03-27: qty 2

## 2019-03-27 MED ORDER — TICAGRELOR 90 MG PO TABS
ORAL_TABLET | ORAL | Status: DC | PRN
  Administered 2019-03-27: 180 mg via ORAL

## 2019-03-27 MED ORDER — HEPARIN (PORCINE) 25000 UT/250ML-% IV SOLN
1750.0000 [IU]/h | INTRAVENOUS | Status: DC
  Administered 2019-03-27: 1200 [IU]/h via INTRAVENOUS
  Filled 2019-03-27: qty 250

## 2019-03-27 MED ORDER — HEPARIN SODIUM (PORCINE) 5000 UNIT/ML IJ SOLN: 4000.0000 [IU] | Freq: Once | INTRAMUSCULAR | Status: DC

## 2019-03-27 MED ORDER — TICAGRELOR 90 MG PO TABS
ORAL_TABLET | ORAL | Status: AC
  Filled 2019-03-27: qty 2

## 2019-03-27 MED ORDER — TIROFIBAN (AGGRASTAT) BOLUS VIA INFUSION
INTRAVENOUS | Status: DC | PRN
  Administered 2019-03-27: 2155 ug via INTRAVENOUS

## 2019-03-27 MED ORDER — SODIUM CHLORIDE 0.9 % IV SOLN: INTRAVENOUS | Status: AC

## 2019-03-27 MED ORDER — SODIUM CHLORIDE 0.9 % IV SOLN
INTRAVENOUS | Status: AC | PRN
  Administered 2019-03-27: 200 mL/h via INTRAVENOUS

## 2019-03-27 MED ORDER — LAMOTRIGINE 100 MG PO TABS
100.0000 mg | ORAL_TABLET | Freq: Two times a day (BID) | ORAL | Status: DC
  Administered 2019-03-27 – 2019-03-31 (×8): 100 mg via ORAL
  Filled 2019-03-27 (×12): qty 1

## 2019-03-27 MED ORDER — MORPHINE SULFATE (PF) 4 MG/ML IV SOLN
4.0000 mg | Freq: Once | INTRAVENOUS | Status: AC
  Administered 2019-03-27: 4 mg via INTRAVENOUS
  Filled 2019-03-27: qty 1

## 2019-03-27 MED ORDER — HEPARIN SODIUM (PORCINE) 1000 UNIT/ML IJ SOLN
INTRAMUSCULAR | Status: DC | PRN
  Administered 2019-03-27 (×2): 4000 [IU] via INTRAVENOUS

## 2019-03-27 MED ORDER — LIDOCAINE HCL (PF) 1 % IJ SOLN
INTRAMUSCULAR | Status: AC
  Filled 2019-03-27: qty 30

## 2019-03-27 MED ORDER — HEPARIN SODIUM (PORCINE) 1000 UNIT/ML IJ SOLN
INTRAMUSCULAR | Status: AC
  Filled 2019-03-27: qty 1

## 2019-03-27 MED ORDER — ATORVASTATIN CALCIUM 80 MG PO TABS
80.0000 mg | ORAL_TABLET | Freq: Every day | ORAL | Status: DC
  Administered 2019-03-27: 80 mg via ORAL
  Filled 2019-03-27: qty 1

## 2019-03-27 MED ORDER — SODIUM CHLORIDE 0.9 % IV SOLN: INTRAVENOUS | Status: DC

## 2019-03-27 MED ORDER — HEPARIN (PORCINE) IN NACL 1000-0.9 UT/500ML-% IV SOLN
INTRAVENOUS | Status: AC
  Filled 2019-03-27: qty 1000

## 2019-03-27 MED ORDER — ASPIRIN 81 MG PO CHEW
324.0000 mg | CHEWABLE_TABLET | Freq: Once | ORAL | Status: AC
  Administered 2019-03-27: 243 mg via ORAL
  Filled 2019-03-27: qty 4

## 2019-03-27 MED ORDER — LIDOCAINE HCL (PF) 1 % IJ SOLN
INTRAMUSCULAR | Status: DC | PRN
  Administered 2019-03-27: 2 mL

## 2019-03-27 MED ORDER — LEVETIRACETAM 750 MG PO TABS
750.0000 mg | ORAL_TABLET | Freq: Two times a day (BID) | ORAL | Status: DC
  Administered 2019-03-27 – 2019-03-31 (×9): 750 mg via ORAL
  Filled 2019-03-27: qty 3
  Filled 2019-03-27 (×2): qty 1
  Filled 2019-03-27 (×2): qty 3
  Filled 2019-03-27 (×4): qty 1
  Filled 2019-03-27: qty 3
  Filled 2019-03-27: qty 1
  Filled 2019-03-27 (×2): qty 3
  Filled 2019-03-27: qty 1

## 2019-03-27 MED ORDER — TIROFIBAN HCL IN NACL 5-0.9 MG/100ML-% IV SOLN
INTRAVENOUS | Status: AC
  Filled 2019-03-27: qty 100

## 2019-03-27 MED ORDER — SODIUM CHLORIDE 0.9 % IV SOLN: 250.0000 mL | INTRAVENOUS | Status: DC | PRN

## 2019-03-27 MED ORDER — NITROGLYCERIN 1 MG/10 ML FOR IR/CATH LAB
INTRA_ARTERIAL | Status: AC
  Filled 2019-03-27: qty 10

## 2019-03-27 MED ORDER — MORPHINE SULFATE (PF) 2 MG/ML IV SOLN: 1.0000 mg | INTRAVENOUS | Status: DC | PRN

## 2019-03-27 MED ORDER — MIDAZOLAM HCL 2 MG/2ML IJ SOLN
INTRAMUSCULAR | Status: AC
  Filled 2019-03-27: qty 2

## 2019-03-27 MED ORDER — IOHEXOL 350 MG/ML SOLN
INTRAVENOUS | Status: DC | PRN
  Administered 2019-03-27: 195 mL

## 2019-03-27 MED ORDER — HEPARIN SODIUM (PORCINE) 1000 UNIT/ML IJ SOLN
4000.0000 [IU] | Freq: Once | INTRAMUSCULAR | Status: DC
  Filled 2019-03-27: qty 4

## 2019-03-27 MED ORDER — LABETALOL HCL 5 MG/ML IV SOLN: 10.0000 mg | INTRAVENOUS | Status: AC | PRN

## 2019-03-27 MED ORDER — SERTRALINE HCL 50 MG PO TABS
25.0000 mg | ORAL_TABLET | Freq: Every day | ORAL | Status: DC
  Administered 2019-03-27 – 2019-03-31 (×5): 25 mg via ORAL
  Filled 2019-03-27 (×6): qty 1

## 2019-03-27 MED ORDER — HEPARIN SODIUM (PORCINE) 5000 UNIT/ML IJ SOLN
INTRAMUSCULAR | Status: AC
  Administered 2019-03-27: 4000 [IU]
  Filled 2019-03-27: qty 1

## 2019-03-27 MED ORDER — TICAGRELOR 90 MG PO TABS
90.0000 mg | ORAL_TABLET | Freq: Two times a day (BID) | ORAL | Status: DC
  Administered 2019-03-27 – 2019-03-31 (×8): 90 mg via ORAL
  Filled 2019-03-27 (×8): qty 1

## 2019-03-27 SURGICAL SUPPLY — 24 items
BALLN SAPPHIRE 2.0X20 (BALLOONS) ×2
BALLN SAPPHIRE 2.5X12 (BALLOONS) ×2
BALLN SAPPHIRE ~~LOC~~ 3.5X15 (BALLOONS) ×1 IMPLANT
BALLOON SAPPHIRE 2.0X20 (BALLOONS) IMPLANT
BALLOON SAPPHIRE 2.5X12 (BALLOONS) IMPLANT
CATH INFINITI 5FR ANG PIGTAIL (CATHETERS) ×1 IMPLANT
CATH OPTITORQUE TIG 4.0 5F (CATHETERS) ×1 IMPLANT
CATH VISTA GUIDE 6FR JR4 (CATHETERS) ×1 IMPLANT
DEVICE RAD COMP TR BAND LRG (VASCULAR PRODUCTS) ×1 IMPLANT
GLIDESHEATH SLEND SS 6F .021 (SHEATH) ×1 IMPLANT
GUIDEWIRE INQWIRE 1.5J.035X260 (WIRE) IMPLANT
INQWIRE 1.5J .035X260CM (WIRE) ×2
KIT ENCORE 26 ADVANTAGE (KITS) ×1 IMPLANT
KIT HEART LEFT (KITS) ×2 IMPLANT
PACK CARDIAC CATHETERIZATION (CUSTOM PROCEDURE TRAY) ×2 IMPLANT
STENT SYNERGY XD 3.0X38 (Permanent Stent) IMPLANT
STENT SYNERGY XD 3.50X20 (Permanent Stent) IMPLANT
SYNERGY XD 3.0X38 (Permanent Stent) ×2 IMPLANT
SYNERGY XD 3.50X20 (Permanent Stent) ×2 IMPLANT
SYR MEDRAD MARK 7 150ML (SYRINGE) ×2 IMPLANT
TRANSDUCER W/STOPCOCK (MISCELLANEOUS) ×2 IMPLANT
TUBING CIL FLEX 10 FLL-RA (TUBING) ×2 IMPLANT
WIRE ASAHI PROWATER 180CM (WIRE) ×1 IMPLANT
WIRE HI TORQ BMW 190CM (WIRE) ×1 IMPLANT

## 2019-03-27 NOTE — Progress Notes (Signed)
Benefits check submitted for [facility]. 

## 2019-03-27 NOTE — ED Triage Notes (Signed)
Per pt, he was woken early this morning w/ left sided non-radiating chest pressure.  Hx of 2 stents.  Called code stemi in triage

## 2019-03-27 NOTE — TOC Benefit Eligibility Note (Signed)
Transition of Care [facility]) Benefit Eligibility Note    Patient Details  Name: Jaizon Graffeo MRN: [id number] Date of Birth: Sep 12, 1955      Covered?: No        Spoke with Person/Company/Phone Number:: Dominica Severin / [employer]/ [phone]           Additional Notes: Patient doesn't have coverage he has a discount card with [employer] who helps with the drug cost and he has used [facility] in the past Per [facility] Phone Number: 03/27/2019, 12:57 PM

## 2019-03-27 NOTE — ED Notes (Signed)
Pt's wife in waiting room- gave her all pt's belongings. Pt taken to the cath lab
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
## 2019-03-27 NOTE — Brief Op Note (Signed)
03/27/2019  9:41 AM  PATIENT:  Tony Saunders  64 y.o. male presented with inferior STEMI.  He has known stents in the RCA  PRE-OPERATIVE DIAGNOSIS: Inferior STEMI  POST-OPERATIVE DIAGNOSIS:    CULPRIT LESION: 1% thrombotic distal RCA distal to previous stents with 40 to 60% stenosis leading up to it from stents.  There is post PTCA noted 70% lesions in the RP AV.  Successful DES PCI of distal RCA into PDA with stent overlapping back to previous stents: Synergy DES 3.0 mm x 38 mm distal, overlapped proximally with a Synergy DES 3.5 mm x 20 mm (postdilated in taper fashion from 3.6 to 3.2 mm)  Otherwise multivessel disease with 80% tapered to 60% proximal to mid LAD at takeoff of major diagonal branch and septal perforator.  80% proximal and 70% mid circumflex involving essentially trifurcation of OM 1, OM 2 and AV groove circumflex.  Severely reduced LVEF of roughly 30 to 35% with global hypokinesis-anterior as well as inferior (will need echo).  Normal EDP.  Borderline hypotension, but stable.   PROCEDURE:  Procedure(s): LEFT HEART CATH AND CORONARY ANGIOGRAPHY (N/A) CORONARY STENT INTERVENTION (N/A) Coronary/Graft Acute MI Revascularization (N/A)  SURGEON:  Surgeon(s) and Role:    * Leonie Man, MD - Primary  PHYSICIAN ASSISTANT:   ASSISTANTS: none   ANESTHESIA:   IV Versed and fentanyl.  SQ lidocaine  EBL: Less than 20 mL  PROCEDURE: 6 French right radial sheath, take 4.0 catheter for diagnostic left and right coronary cineangiography; following PCI, angled pigtail for LV gram.  PCI: 6 [place] JR4 guide, Prowater wire for PDA and BMW wire for RPA V  2.5 mm balloon for occlusion site,  2.0 mm balloon PTCA of RPA V  Overlapping Synergy DES stents: 3 mm x 38 mm from proximal PDA to distal RCA, 3,5 mm x 20 mm mid to distal overlapping from previous stents to new stent -> taper post dilation from 3.6 to 3.2 mm (2.5 mm [place] balloon)  MEDICATIONS USED: Oral Brilinta 100  mg, 500 mL NS bolus, IV Aggrastat bolus and drip, will continue drip until current bag complete. -Contrast-195 mL  COUNTS:  YES  TR band placed with radial sheath pulled  DICTATION: .Note written in EPIC   PLAN OF CARE: Admit to inpatient  -CCU.  Run Aggrastat for roughly 2 hours until current bottle completed --Restart IV heparin 8 hours after sheath removal.  Will need 2D echocardiogram checked  Likely will require diuresis based on reduced EF  Will need to consider staged PCI of LAD and Circumflex-tentatively scheduled for Thursday with Dr. Tamala Julian  Current blood pressure too low to initiate GDMT with beta-blocker, ACE/ARB or spironolactone.  DAPT for minimum 1 year  High-dose high intensity statin-start beta-blocker when able  Restart antiseizure medications  PATIENT DISPOSITION:  PACU - hemodynamically stable.     Glenetta Hew, M.D., M.S. Interventional Cardiologist   Pager # [phone] Phone # [phone] [address] [place], [place] [address]

## 2019-03-27 NOTE — Progress Notes (Signed)
ANTICOAGULATION CONSULT NOTE - Initial Consult  Pharmacy Consult for heparin Indication: chest pain/ACS  No Known Allergies  Patient Measurements: Height: 6' (182.9 cm) Weight: 190 lb (86.2 kg) IBW/kg (Calculated) : 77.6 Heparin Dosing Weight: 86kg  Vital Signs: Temp: 98.6 F (37 C) (03/09 0718) Temp Source: Oral (03/09 0718) BP: 107/73 (03/09 0908) Pulse Rate: 98 (03/09 0908)  Labs: Recent Labs    03/27/19 0706  HGB 12.7*  HCT 39.8  PLT 340  APTT 30  LABPROT 14.4  INR 1.1  CREATININE 1.12  TROPONINIHS 1,560*    Estimated Creatinine Clearance: 75.1 mL/min (by C-G formula based on SCr of 1.12 mg/dL).   Medical History: Past Medical History:  Diagnosis Date  . Seizures ([place])    most recent today, 07/23/15   Assessment: 64 year old male presents to [facility] as code stemi. Patient now s/p PCI to RCA, residual left sided disease noted and planned PCI in 48-72 hours. Orders to finish current bag of tirofiban and restart heparin tonight.   CBC and BMET wnl, no bleeding complications noted during cath.   Goal of Therapy:  Heparin level 0.3-0.7 units/ml Monitor platelets by anticoagulation protocol: Yes   Plan:  Start heparin infusion at 1200 units/hr Check anti-Xa level in 6 hours and daily while on heparin Continue to monitor H&H and platelets  Erin Hearing PharmD., BCPS Clinical Pharmacist 03/27/2019 10:31 AM
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
## 2019-03-28 ENCOUNTER — Encounter (HOSPITAL_COMMUNITY): Payer: Self-pay | Admitting: Cardiology

## 2019-03-28 ENCOUNTER — Inpatient Hospital Stay (HOSPITAL_COMMUNITY): Payer: PRIVATE HEALTH INSURANCE

## 2019-03-28 DIAGNOSIS — I2119 ST elevation (STEMI) myocardial infarction involving other coronary artery of inferior wall: Principal | ICD-10-CM

## 2019-03-28 DIAGNOSIS — I340 Nonrheumatic mitral (valve) insufficiency: Secondary | ICD-10-CM

## 2019-03-28 HISTORY — PX: TRANSTHORACIC ECHOCARDIOGRAM: SHX275

## 2019-03-28 LAB — CBC
HCT: 32.7 % — ABNORMAL LOW (ref 39.0–52.0)
HCT: 35.5 % — ABNORMAL LOW (ref 39.0–52.0)
Hemoglobin: 10.6 g/dL — ABNORMAL LOW (ref 13.0–17.0)
Hemoglobin: 11.5 g/dL — ABNORMAL LOW (ref 13.0–17.0)
MCH: 28.1 pg (ref 26.0–34.0)
MCH: 28.4 pg (ref 26.0–34.0)
MCHC: 32.4 g/dL (ref 30.0–36.0)
MCHC: 32.4 g/dL (ref 30.0–36.0)
MCV: 86.7 fL (ref 80.0–100.0)
MCV: 87.7 fL (ref 80.0–100.0)
Platelets: 275 10*3/uL (ref 150–400)
Platelets: 285 10*3/uL (ref 150–400)
RBC: 3.77 MIL/uL — ABNORMAL LOW (ref 4.22–5.81)
RBC: 4.05 MIL/uL — ABNORMAL LOW (ref 4.22–5.81)
RDW: 13.6 % (ref 11.5–15.5)
RDW: 13.8 % (ref 11.5–15.5)
WBC: 7.8 10*3/uL (ref 4.0–10.5)
WBC: 9.8 10*3/uL (ref 4.0–10.5)
nRBC: 0 % (ref 0.0–0.2)
nRBC: 0 % (ref 0.0–0.2)

## 2019-03-28 LAB — BASIC METABOLIC PANEL
Anion gap: 9 (ref 5–15)
BUN: 12 mg/dL (ref 8–23)
CO2: 23 mmol/L (ref 22–32)
Calcium: 8.5 mg/dL — ABNORMAL LOW (ref 8.9–10.3)
Chloride: 106 mmol/L (ref 98–111)
Creatinine, Ser: 0.91 mg/dL (ref 0.61–1.24)
GFR calc Af Amer: >60 mL/min (ref >60)
GFR calc non Af Amer: >60 mL/min (ref >60)
Glucose, Bld: 104 mg/dL — ABNORMAL HIGH (ref 70–99)
Potassium: 5 mmol/L (ref 3.5–5.1)
Sodium: 138 mmol/L (ref 135–145)

## 2019-03-28 LAB — LIPID PANEL
Cholesterol: 124 mg/dL (ref 0–200)
HDL: 22 mg/dL — ABNORMAL LOW (ref >40)
LDL Cholesterol: 77 mg/dL (ref 0–99)
Total CHOL/HDL Ratio: 5.6 RATIO
Triglycerides: 125 mg/dL (ref <150)
VLDL: 25 mg/dL (ref 0–40)

## 2019-03-28 LAB — ECHOCARDIOGRAM COMPLETE
Height: 72 in
Weight: 3040 oz

## 2019-03-28 LAB — HEPATIC FUNCTION PANEL
ALT: 95 U/L — ABNORMAL HIGH (ref 0–44)
AST: 238 U/L — ABNORMAL HIGH (ref 15–41)
Albumin: 2.6 g/dL — ABNORMAL LOW (ref 3.5–5.0)
Alkaline Phosphatase: 69 U/L (ref 38–126)
Bilirubin, Direct: 0.2 mg/dL (ref 0.0–0.2)
Indirect Bilirubin: 0.4 mg/dL (ref 0.3–0.9)
Total Bilirubin: 0.6 mg/dL (ref 0.3–1.2)
Total Protein: 5.6 g/dL — ABNORMAL LOW (ref 6.5–8.1)

## 2019-03-28 LAB — HEPARIN LEVEL (UNFRACTIONATED)
Heparin Unfractionated: 0.1 IU/mL — ABNORMAL LOW (ref 0.30–0.70)
Heparin Unfractionated: 0.15 IU/mL — ABNORMAL LOW (ref 0.30–0.70)
Heparin Unfractionated: 0.18 IU/mL — ABNORMAL LOW (ref 0.30–0.70)

## 2019-03-28 LAB — GLUCOSE, CAPILLARY: Glucose-Capillary: 103 mg/dL — ABNORMAL HIGH (ref 70–99)

## 2019-03-28 LAB — TSH: TSH: 3.297 u[IU]/mL (ref 0.350–4.500)

## 2019-03-28 MED ORDER — HEPARIN BOLUS VIA INFUSION
2000.0000 [IU] | Freq: Once | INTRAVENOUS | Status: DC
  Filled 2019-03-28: qty 2000

## 2019-03-28 MED ORDER — SODIUM CHLORIDE 0.9 % IV SOLN: INTRAVENOUS | Status: DC

## 2019-03-28 MED ORDER — ATORVASTATIN CALCIUM 40 MG PO TABS
40.0000 mg | ORAL_TABLET | Freq: Every day | ORAL | Status: DC
  Administered 2019-03-28 – 2019-03-30 (×3): 40 mg via ORAL
  Filled 2019-03-28 (×3): qty 1

## 2019-03-28 MED ORDER — CARVEDILOL 3.125 MG PO TABS
3.1250 mg | ORAL_TABLET | Freq: Two times a day (BID) | ORAL | Status: DC
  Administered 2019-03-28 (×2): 3.125 mg via ORAL
  Filled 2019-03-28 (×2): qty 1

## 2019-03-28 MED ORDER — HEPARIN (PORCINE) 25000 UT/250ML-% IV SOLN
1800.0000 [IU]/h | INTRAVENOUS | Status: DC
  Administered 2019-03-28 (×2): 1600 [IU]/h via INTRAVENOUS
  Filled 2019-03-28 (×2): qty 250

## 2019-03-28 MED FILL — Nitroglycerin IV Soln 100 MCG/ML in D5W: INTRA_ARTERIAL | Qty: 10 | Status: AC

## 2019-03-28 NOTE — Plan of Care (Signed)
  Problem: Education: Goal: Knowledge of General Education information will improve Description: Including pain rating scale, medication(s)/side effects and non-pharmacologic comfort measures Outcome: Progressing   Problem: Health Behavior/Discharge Planning: Goal: Ability to manage health-related needs will improve Outcome: Progressing   Problem: Clinical Measurements: Goal: Ability to maintain clinical measurements within normal limits will improve Outcome: Progressing Goal: Will remain free from infection Outcome: Progressing Goal: Diagnostic test results will improve Outcome: Progressing Goal: Respiratory complications will improve Outcome: Progressing Goal: Cardiovascular complication will be avoided Outcome: Completed/Met

## 2019-03-28 NOTE — Progress Notes (Signed)
[place] for heparin Indication: chest pain/ACS   Assessment: 64 year old male presents to [facility] as code stemi. Patient now s/p PCI to RCA, residual left sided disease noted and planned PCI in 48-72 hours.  Heparin level still below goal this morning (0.15) on 1450 units/hr. No bleeding issues noted. Hgb slowly trending down but no overt bleeding noted.  Goal of Therapy:  Heparin level 0.3-0.7 units/ml Monitor platelets by anticoagulation protocol: Yes   Plan:  Increase heparin to 1750 units/hr Check heparin level 6 hours after rate change Thanks for allowing pharmacy to be a part of this patient's care.  Erin Hearing PharmD., BCPS Clinical Pharmacist 03/28/2019 8:31 AM

## 2019-03-28 NOTE — Progress Notes (Signed)
  Echocardiogram 2D Echocardiogram has been performed.  Tony Saunders 03/28/2019, 11:06 AM

## 2019-03-28 NOTE — Progress Notes (Addendum)
    Called by RN with patient noted to be in new onset afib. Review of telemetry shows several bursts of afib with elevated rates in the 130s, with underlying SR. Currently ST with rates in the 100s. Noted heparin was stopped this morning with mild drop in H/H. Will resume IV heparin for now and trend H/H this afternoon. Blood pressures remains soft, therefore unable to increase coreg at this time. If recurrence of afib and maintains would plan to start amiodarone.   SignedReino Bellis, NP-C 03/28/2019, 3:18 PM Pager: [phone]

## 2019-03-28 NOTE — Progress Notes (Signed)
CARDIAC REHAB PHASE I   PRE:  Rate/Rhythm: 103 ST    BP: sitting 100/69    SaO2: 98 RA  MODE:  Ambulation: 290 ft   POST:  Rate/Rhythm: 138 ? afib    BP: sitting 116/70     SaO2: 100  RA  Pt hadn't been up yet therefore got to recliner initially. Tolerated well. Discussed MI, stent, Brilinta, and restrictions with pt and wife. Receptive. After discussion, pt wanted to walk. Steady, no c/o but did have increased rate to 130s, ? Afib. Upon sitting back in recliner, HR decreased but no longer has p waves. Appears to be in Afib. RN aware. Pt denies any c/o, feels well. Sts he has been fatigued and feeling poorly for 1 month but can tell a difference walking, less tired. [place], ACSM 03/28/2019 2:44 PM

## 2019-03-28 NOTE — Progress Notes (Signed)
[place] for heparin Indication: chest pain/ACS   Assessment: 64 year old male presents to [facility] as code stemi. Patient now s/p PCI to RCA, residual left sided disease noted and planned PCI in 48-72 hours. Orders to finish current bag of tirofiban and restart heparin tonight.   CBC and BMET wnl, no bleeding complications noted during cath.  Heparin level 0.1 units/ml  Goal of Therapy:  Heparin level 0.3-0.7 units/ml Monitor platelets by anticoagulation protocol: Yes   Plan:  Increase heparin to 1450 units/hr Check heparin level 6 hours after rate change Thanks for allowing pharmacy to be a part of this patient's care.  Excell Seltzer, PharmD Clinical Pharmacist  03/28/2019 12:18 AM

## 2019-03-28 NOTE — Progress Notes (Addendum)
Progress Note  Patient Name: Tony Saunders Date of Encounter: 03/28/2019  Primary Cardiologist: New, Dr. Ellyn Hack  Subjective   Recurrent chest pain  Inpatient Medications    Scheduled Meds: . aspirin  81 mg Oral Daily  . atorvastatin  80 mg Oral q1800  . Chlorhexidine Gluconate Cloth  6 each Topical Daily  . lamoTRIgine  100 mg Oral BID  . levETIRAcetam  750 mg Oral BID  . sertraline  25 mg Oral Daily  . sodium chloride flush  3 mL Intravenous Q12H  . ticagrelor  90 mg Oral BID   Continuous Infusions: . sodium chloride 20 mL/hr at 03/27/19 0733  . sodium chloride    . heparin 1,450 Units/hr (03/28/19 0017)   PRN Meds: sodium chloride, acetaminophen, morphine injection, ondansetron (ZOFRAN) IV, sodium chloride flush   Vital Signs    Vitals:   03/28/19 0500 03/28/19 0600 03/28/19 0700 03/28/19 0800  BP: 100/67 98/69 112/75 99/64  Pulse: 100 (!) 102 (!) 104 (!) 107  Resp: 20 (!) 27 19 (!) 25  Temp:      TempSrc:      SpO2: 96% 95% 100% 98%  Weight:      Height:        Intake/Output Summary (Last 24 hours) at 03/28/2019 0812 Last data filed at 03/28/2019 0600 Gross per 24 hour  Intake 781.8 ml  Output 805 ml  Net -23.2 ml    I/O since admission: -23  Filed Weights   03/27/19 0657  Weight: 86.2 kg    Telemetry    Sinus Tachycardia at 105 - Personally Reviewed  ECG    03/28/2019 ECG (independently read by me): Sinus tachycardia at 101; residual 1 mm ST elevation 2 3 and F with resolution anterolaterally.  QTc interval 456 ms  3/9 2021 ECG (independently read by me): Sinus tachycardia at 103, 1 mm inferolateral ST elevation; QTc interval 49 ms  Physical Exam   BP 99/64   Pulse (!) 107   Temp 99.1 F (37.3 C)   Resp (!) 25   Ht 6' (1.829 m)   Wt 86.2 kg   SpO2 98%   BMI 25.77 kg/m  General: Alert, oriented, no distress.  Skin: normal turgor, no rashes, warm and dry HEENT: Normocephalic, atraumatic. Pupils equal round and reactive to light;  sclera anicteric; extraocular muscles intact;  Nose without nasal septal hypertrophy Mouth/Parynx benign;  Neck: No JVD, no carotid bruits; normal carotid upstroke Lungs: clear to ausculatation and percussion; no wheezing or rales Chest wall: without tenderness to palpitation Heart: PMI not displaced, RRR, s1 s2 normal, 1/6 systolic murmur, no diastolic murmur, no rubs, gallops, thrills, or heaves Abdomen: soft, nontender; no hepatosplenomehaly, BS+; abdominal aorta nontender and not dilated by palpation. Back: no CVA tenderness Pulses 2+; right radial cath site stable Musculoskeletal: full range of motion, normal strength, no joint deformities Extremities: no clubbing cyanosis or edema, Homan's sign negative  Neurologic: grossly nonfocal; Cranial nerves grossly wnl Psychologic: Normal mood and affect   Labs    Chemistry Recent Labs  Lab 03/27/19 0706 03/27/19 0801 03/27/19 2252  NA 137 137 138  K 4.4 3.9 5.0  CL 101 104 106  CO2 22  --  23  GLUCOSE 127* 121* 104*  BUN 13 14 12   CREATININE 1.12 0.80 0.91  CALCIUM 8.9  --  8.5*  PROT 6.1*  --  5.6*  ALBUMIN 3.0*  --  2.6*  AST 133*  --  238*  ALT 99*  --  95*  ALKPHOS 81  --  69  BILITOT 0.5  --  0.6  GFRNONAA >60  --  >60  GFRAA >60  --  >60  ANIONGAP 14  --  9     Hematology Recent Labs  Lab 03/27/19 0706 03/27/19 0801 03/27/19 2252  WBC 10.0  --  7.8  RBC 4.52  --  3.77*  HGB 12.7* 11.2* 10.6*  HCT 39.8 33.0* 32.7*  MCV 88.1  --  86.7  MCH 28.1  --  28.1  MCHC 31.9  --  32.4  RDW 13.7  --  13.6  PLT 340  --  275          Cardiac EnzymesNo results for input(s): TROPONINI in the last 168 hours. No results for input(s): TROPIPOC in the last 168 hours.   BNPNo results for input(s): BNP, PROBNP in the last 168 hours.   DDimer No results for input(s): DDIMER in the last 168 hours.   Lipid Panel     Component Value Date/Time   CHOL 124 03/27/2019 2252   TRIG 125 03/27/2019 2252   HDL 22 (L)  03/27/2019 2252   CHOLHDL 5.6 03/27/2019 2252   VLDL 25 03/27/2019 2252   LDLCALC 77 03/27/2019 2252     Radiology    CARDIAC CATHETERIZATION  Result Date: 03/27/2019  Prox RCA to Mid RCA stented segment is 15% stenosed.  CULPRIT LESION: Mid RCA to Dist RCA lesion is 100% stenosed with 80% stenosed side branch in RPDA. RPDA lesion is 60% stenosed.  A drug-eluting stent was successfully placed crossing the lesion and across the RPA V into the proximal PDA using a SYNERGY XD 3.0X38. This is postdilated from 3.6 to 3.2 mm.  A second drug-eluting stent was successfully placed overlapping the new stent and the old stent, using a SYNERGY XD 3.50X20. The entire segment was then postdilated to 3.7 mm.  Post intervention, there is a 0% residual stenosis in the entire stented segment.  -  RPAV lesion is 80% stenosed with 80% stenosed side branch in 2nd RPL.  Balloon angioplasty was performed from the ostium of the RPA V into the RPL 2, using a BALLOON SAPPHIRE 2.0X20.  Post intervention, there is a 0% residual stenosis from ostial PAV up to RPL 2.  Post intervention, the RPL 2 side branch was reduced to 10% residual stenosis.  ===============  Prox LAD to Mid LAD lesion is 85% stenosed. Mid LAD lesion is 65% stenosed with 0% stenosed side branch in 1st Sept.  Prox Cx lesion is 80% stenosed. Prox Cx to Mid Cx lesion is 70% stenosed.  ---- Hemodynamics------  There is severe left ventricular systolic dysfunction. The left ventricular ejection fraction is 25-35% by visual estimate.  LV end diastolic pressure is moderately elevated.  There is mild (2+) mitral regurgitation.  RPDA lesion is 60% stenosed.  SUMMARY  CULPRIT LESION: 100% thrombotic distal RCA distal to previous stents with 40 to 60% stenosis leading up to it from stents.  There is post PTCA noted 80% lesions in the RP AV. ? Successful DES PCI of distal RCA into PDA with stent overlapping back to previous stents: Synergy DES 3.0 mm x 38 mm  distal, overlapped proximally with a Synergy DES 3.5 mm x 20 mm (postdilated in taper fashion from 3.6 to 3.2 mm)  Otherwise multivessel disease with 80% tapered to 60% proximal to mid LAD at takeoff of major diagonal branch and septal perforator.  80% proximal and 70% mid  circumflex involving essentially trifurcation of OM 1, OM 2 and AV groove circumflex.  Severely reduced LVEF of roughly 30 to 35% with global hypokinesis-anterior as well as inferior (will need echo).  Normal EDP.  Borderline hypotension, but stable. RECOMMENDATIONS  Admit to inpatient  -CCU.  Run Aggrastat for roughly 2 hours until current bottle completed  Restart IV heparin 8 hours after sheath removal.  Will need 2D echocardiogram checked  Likely will require diuresis based on reduced EF  Will need to consider staged PCI of LAD and Circumflex-tentatively scheduled for Thursday with Dr. Tamala Julian  Current blood pressure too low to initiate GDMT with beta-blocker, ACE/ARB or spironolactone.  DAPT for minimum 1 year  High-dose high intensity statin-start beta-blocker when able  Restart antiseizure medications Glenetta Hew, M.D., M.S. Interventional Cardiologist [place]. [address] [place], [place] [address]  DG Chest Port 1 View  Result Date: 03/27/2019 CLINICAL DATA:  Chest pain EXAM: PORTABLE CHEST 1 VIEW COMPARISON:  05/18/2004 FINDINGS: Artifact from EKG leads. Interstitial coarsening, question emphysema. Normal heart size and mediastinal contours. There is no edema, consolidation, effusion, or pneumothorax. IMPRESSION: No evidence of acute disease. Electronically Signed   By: Monte Fantasia M.D.   On: 03/27/2019 07:50    Cardiac Studies    Prox RCA to Mid RCA stented segment is 15% stenosed.  CULPRIT LESION: Mid RCA to Dist RCA lesion is 100% stenosed with 80% stenosed side branch in RPDA. RPDA lesion is 60% stenosed.  A drug-eluting stent was successfully placed crossing the lesion and across the RPA V into the  proximal PDA using a SYNERGY XD 3.0X38. This is postdilated from 3.6 to 3.2 mm.  A second drug-eluting stent was successfully placed overlapping the new stent and the old stent, using a SYNERGY XD 3.50X20. The entire segment was then postdilated to 3.7 mm.  Post intervention, there is a 0% residual stenosis in the entire stented segment.  -  RPAV lesion is 80% stenosed with 80% stenosed side branch in 2nd RPL.  Balloon angioplasty was performed from the ostium of the RPA V into the RPL 2, using a BALLOON SAPPHIRE 2.0X20.  Post intervention, there is a 0% residual stenosis from ostial PAV up to RPL 2.  Post intervention, the RPL 2 side branch was reduced to 10% residual stenosis.  ===============  Prox LAD to Mid LAD lesion is 85% stenosed. Mid LAD lesion is 65% stenosed with 0% stenosed side branch in 1st Sept.  Prox Cx lesion is 80% stenosed. Prox Cx to Mid Cx lesion is 70% stenosed.  ---- Hemodynamics------  There is severe left ventricular systolic dysfunction. The left ventricular ejection fraction is 25-35% by visual estimate.  LV end diastolic pressure is moderately elevated.  There is mild (2+) mitral regurgitation.  RPDA lesion is 60% stenosed.   SUMMARY  CULPRIT LESION: 100% thrombotic distal RCA distal to previous stents with 40 to 60% stenosis leading up to it from stents. There is post PTCA noted 80% lesions in the RP AV. ? Successful DES PCI of distal RCA into PDA with stent overlapping back to previous stents: Synergy DES 3.0 mm x 38 mm distal, overlapped proximally with a Synergy DES 3.5 mm x 20 mm (postdilated in taper fashion from 3.6 to 3.2 mm)  Otherwise multivessel disease with 80% tapered to 60% proximal to mid LAD at takeoff of major diagonal branch and septal perforator.  80% proximal and 70% mid circumflex involving essentially trifurcation of OM 1, OM 2 and AV  groove circumflex.  Severely reduced LVEF of roughly 30 to 35% with global  hypokinesis-anterior as well as inferior (will need echo).  Normal EDP. Borderline hypotension, but stable.   RECOMMENDATIONS  Admit to inpatient-CCU. Run Aggrastat for roughly 2 hours until current bottle completed  Restart IV heparin 8 hours after sheath removal.  Will need 2D echocardiogram checked  Likely will require diuresis based on reduced EF  Will need to consider staged PCI of LAD and Circumflex-tentatively scheduled for Thursday with Dr. Tamala Julian  Current blood pressure too low to initiate GDMT with beta-blocker, ACE/ARB or spironolactone.  DAPT for minimum 1 year  High-dose high intensity statin-start beta-blocker when able  Restart antiseizure medications     Patient Profile     Tony Saunders is a 64 y.o. male with history of epilepsy/seizure disorder and distant history of CAD with stents in the RCA who presents with sudden onset chest pain at 3 AM on 03/27/2019.  EKG upon arrival to ER at roughly 050 indicated inferior STEMI.  Code STEMI was called at 7:23 AM.  Assessment & Plan    1. Day 1 s/p Inferior STEMI: Patient history of prior RCA stenting.  Presented yesterday being awakened from sleep at approximately 3 or 4 AM with severe chest pain and diaphoresis.  Intragraphic studies personally reviewed.  Patient underwent successful stenting x2 of his distal RCA extending into the ostium of the PDA.  On DAPT with aspirin/Brilinta.  He was not started on beta-blocker therapy due to low blood pressure yesterday.  BP this morning approximately [id number] systolic.  We reinitiate IV fluids at 75 cc an hour and with resting tachycardia consider low-dose carvedilol 3.125 mg twice a day.  2.  Concomitant CAD: Tentatively scheduled for staged PCI to LAD and left circumflex tomorrow.  3.  Transaminase elevation with AST/ ALT 238/95.  May be secondary to MI.  However will reduce atorvastatin to 40 mg for now with plans to increase LFTs improved.  4.  Hyperlipidemia: Target   LDL less than 70; on atorvastatin.  5.  Post procedure anemia: Hb 12.7->10.6/ HCT 39.8 - >32.7.  Will DC heparin; CBC later today.  6.  History of seizures on Lamictal and Keppra  7.  Consider evaluation for obstructive sleep apnea since patient was awakened from sleep in the early morning most likely during REM sleep raising concern for significant REM sleep-related oxygen desaturation potentially in the setting of sleep apnea.  Signed, Troy Sine, MD, [facility] 03/28/2019, 8:12 AM

## 2019-03-28 NOTE — Progress Notes (Signed)
ANTICOAGULATION CONSULT NOTE   Pharmacy Consult for Heparin Indication: atrial fibrillation  No Known Allergies  Patient Measurements: Height: 6' (182.9 cm) Weight: 190 lb (86.2 kg) IBW/kg (Calculated) : 77.6 Heparin Dosing Weight: 86.2 kg   Vital Signs: Temp: 98.7 F (37.1 C) (03/10 2000) Temp Source: Oral (03/10 2000) BP: 105/72 (03/10 2100) Pulse Rate: 106 (03/10 2100)  Labs: Recent Labs    03/27/19 0706 03/27/19 0706 03/27/19 0801 03/27/19 0801 03/27/19 1008 03/27/19 1315 03/27/19 1448 03/27/19 2252 03/28/19 0714 03/28/19 1443 03/28/19 2214  HGB 12.7*   < > 11.2*   < >  --   --   --  10.6*  --  11.5*  --   HCT 39.8   < > 33.0*  --   --   --   --  32.7*  --  35.5*  --   PLT 340  --   --   --   --   --   --  275  --  285  --   APTT 30  --   --   --   --   --   --   --   --   --   --   LABPROT 14.4  --   --   --   --   --   --   --   --   --   --   INR 1.1  --   --   --   --   --   --   --   --   --   --   HEPARINUNFRC  --   --   --   --   --   --   --  0.10* 0.15*  --  0.18*  CREATININE 1.12  --  0.80  --   --   --   --  0.91  --   --   --   TROPONINIHS 1,560*   < >  --   --  >27,000* >27,000* >27,000*  --   --   --   --    < > = values in this interval not displayed.    Estimated Creatinine Clearance: 92.4 mL/min (by C-G formula based on SCr of 0.91 mg/dL).   Medical History: Past Medical History:  Diagnosis Date  . Seizures ([place])    most recent today, 07/23/15    Assessment: 65 year old male presented on 03/27/2019 to [facility] as Code STEMI. Patient now s/p PCI to RCA, residual left sided disease noted and planned staged PCI tomorrow of LAD and left circumflex. Patient previously on heparin for ACS now after discussion with Reino Bellis noted patient to be in Afib and desired to resume heparin.  Hgb 10.6. Plt 275. No reported bleeding.   3/10 PM update:  Heparin level sub-therapeutic No issues per RN  Goal of Therapy:  Heparin level 0.3-0.7  units/ml Monitor platelets by anticoagulation protocol: Yes   Plan:  Inc heparin to 1800 units/hr Re-check heparin level in 8 hours   Narda Bonds, PharmD, [place] Pharmacist Phone: [phone]

## 2019-03-28 NOTE — Progress Notes (Signed)
ANTICOAGULATION CONSULT NOTE - Initial Consult  Pharmacy Consult for Heparin Indication: atrial fibrillation  No Known Allergies  Patient Measurements: Height: 6' (182.9 cm) Weight: 190 lb (86.2 kg) IBW/kg (Calculated) : 77.6 Heparin Dosing Weight: 86.2 kg   Vital Signs: Temp: 99.3 F (37.4 C) (03/10 0834) Temp Source: Oral (03/10 0834) BP: 110/73 (03/10 1400) Pulse Rate: 105 (03/10 1400)  Labs: Recent Labs    03/27/19 0706 03/27/19 0706 03/27/19 0801 03/27/19 0801 03/27/19 1008 03/27/19 1315 03/27/19 1448 03/27/19 2252 03/28/19 0714 03/28/19 1443  HGB 12.7*   < > 11.2*   < >  --   --   --  10.6*  --  11.5*  HCT 39.8   < > 33.0*  --   --   --   --  32.7*  --  35.5*  PLT 340  --   --   --   --   --   --  275  --  285  APTT 30  --   --   --   --   --   --   --   --   --   LABPROT 14.4  --   --   --   --   --   --   --   --   --   INR 1.1  --   --   --   --   --   --   --   --   --   HEPARINUNFRC  --   --   --   --   --   --   --  0.10* 0.15*  --   CREATININE 1.12  --  0.80  --   --   --   --  0.91  --   --   TROPONINIHS 1,560*   < >  --   --  >27,000* >27,000* >27,000*  --   --   --    < > = values in this interval not displayed.    Estimated Creatinine Clearance: 92.4 mL/min (by C-G formula based on SCr of 0.91 mg/dL).   Medical History: Past Medical History:  Diagnosis Date  . Seizures ([place])    most recent today, 07/23/15    Assessment: 64 year old male presented on 03/27/2019 to [facility] as Code STEMI. Patient now s/p PCI to RCA, residual left sided disease noted and planned staged PCI tomorrow of LAD and left circumflex. Patient previously on heparin for ACS now after discussion with Reino Bellis noted patient to be in Afib and desired to resume heparin.  Hgb 10.6. Plt 275. No reported bleeding.   Goal of Therapy:  Heparin level 0.3-0.7 units/ml Monitor platelets by anticoagulation protocol: Yes   Plan:  Start heparin at 1600 units/hr  Check heparin level  2200 Monitor heparin level, CBC, and S/S of bleeding daily Follow up plans after cardiac cath   Cristela Felt, PharmD PGY1 Pharmacy Resident [employer]: [phone]   03/28/2019,3:21 PM

## 2019-03-29 ENCOUNTER — Inpatient Hospital Stay (HOSPITAL_COMMUNITY)
Admission: EM | Disposition: A | Discharge status: Home / Self Care | Payer: PRIVATE HEALTH INSURANCE | Source: Home / Self Care | Attending: Cardiology

## 2019-03-29 ENCOUNTER — Ambulatory Visit (HOSPITAL_COMMUNITY)
Admission: RE | Admit: 2019-03-29 | Payer: No Typology Code available for payment source | Source: Ambulatory Visit | Admitting: Interventional Cardiology

## 2019-03-29 DIAGNOSIS — I480 Paroxysmal atrial fibrillation: Secondary | ICD-10-CM

## 2019-03-29 HISTORY — PX: RIGHT HEART CATH: CATH118263

## 2019-03-29 HISTORY — PX: CORONARY STENT INTERVENTION: CATH118234

## 2019-03-29 LAB — POCT I-STAT EG7
Acid-base deficit: 3 mmol/L — ABNORMAL HIGH (ref 0.0–2.0)
Acid-base deficit: 5 mmol/L — ABNORMAL HIGH (ref 0.0–2.0)
Acid-base deficit: 2 mmol/L (ref 0.0–2.0)
Bicarbonate: 19.2 mmol/L — ABNORMAL LOW (ref 20.0–28.0)
Bicarbonate: 21.9 mmol/L (ref 20.0–28.0)
Bicarbonate: 22 mmol/L (ref 20.0–28.0)
Calcium, Ion: 1.11 mmol/L — ABNORMAL LOW (ref 1.15–1.40)
Calcium, Ion: 1.19 mmol/L (ref 1.15–1.40)
Calcium, Ion: 1.16 mmol/L (ref 1.15–1.40)
HCT: 29 % — ABNORMAL LOW (ref 39.0–52.0)
HCT: 29 % — ABNORMAL LOW (ref 39.0–52.0)
HCT: 29 % — ABNORMAL LOW (ref 39.0–52.0)
Hemoglobin: 9.9 g/dL — ABNORMAL LOW (ref 13.0–17.0)
Hemoglobin: 9.9 g/dL — ABNORMAL LOW (ref 13.0–17.0)
Hemoglobin: 9.9 g/dL — ABNORMAL LOW (ref 13.0–17.0)
O2 Saturation: 63 %
O2 Saturation: 98 %
O2 Saturation: 66 %
Potassium: 3.8 mmol/L (ref 3.5–5.1)
Potassium: 4.1 mmol/L (ref 3.5–5.1)
Potassium: 4.1 mmol/L (ref 3.5–5.1)
Sodium: 125 mmol/L — ABNORMAL LOW (ref 135–145)
Sodium: 134 mmol/L — ABNORMAL LOW (ref 135–145)
Sodium: 135 mmol/L (ref 135–145)
TCO2: 20 mmol/L — ABNORMAL LOW (ref 22–32)
TCO2: 23 mmol/L (ref 22–32)
TCO2: 23 mmol/L (ref 22–32)
pCO2, Ven: 33.2 mmHg — ABNORMAL LOW (ref 44.0–60.0)
pCO2, Ven: 35.5 mmHg — ABNORMAL LOW (ref 44.0–60.0)
pCO2, Ven: 34.1 mmHg — ABNORMAL LOW (ref 44.0–60.0)
pH, Ven: 7.37 (ref 7.250–7.430)
pH, Ven: 7.399 (ref 7.250–7.430)
pH, Ven: 7.418 (ref 7.250–7.430)
pO2, Ven: 116 mmHg — ABNORMAL HIGH (ref 32.0–45.0)
pO2, Ven: 32 mmHg (ref 32.0–45.0)
pO2, Ven: 33 mmHg (ref 32.0–45.0)

## 2019-03-29 LAB — COMPREHENSIVE METABOLIC PANEL
ALT: 80 U/L — ABNORMAL HIGH (ref 0–44)
AST: 118 U/L — ABNORMAL HIGH (ref 15–41)
Albumin: 2.3 g/dL — ABNORMAL LOW (ref 3.5–5.0)
Alkaline Phosphatase: 66 U/L (ref 38–126)
Anion gap: 9 (ref 5–15)
BUN: 11 mg/dL (ref 8–23)
CO2: 21 mmol/L — ABNORMAL LOW (ref 22–32)
Calcium: 8.2 mg/dL — ABNORMAL LOW (ref 8.9–10.3)
Chloride: 105 mmol/L (ref 98–111)
Creatinine, Ser: 1 mg/dL (ref 0.61–1.24)
GFR calc Af Amer: >60 mL/min (ref >60)
GFR calc non Af Amer: >60 mL/min (ref >60)
Glucose, Bld: 100 mg/dL — ABNORMAL HIGH (ref 70–99)
Potassium: 4.1 mmol/L (ref 3.5–5.1)
Sodium: 135 mmol/L (ref 135–145)
Total Bilirubin: 0.5 mg/dL (ref 0.3–1.2)
Total Protein: 4.9 g/dL — ABNORMAL LOW (ref 6.5–8.1)

## 2019-03-29 LAB — POCT ACTIVATED CLOTTING TIME
Activated Clotting Time: 279 seconds
Activated Clotting Time: 307 seconds
Activated Clotting Time: 290 seconds
Activated Clotting Time: 296 seconds

## 2019-03-29 LAB — CBC
HCT: 31.6 % — ABNORMAL LOW (ref 39.0–52.0)
Hemoglobin: 10.2 g/dL — ABNORMAL LOW (ref 13.0–17.0)
MCH: 27.8 pg (ref 26.0–34.0)
MCHC: 32.3 g/dL (ref 30.0–36.0)
MCV: 86.1 fL (ref 80.0–100.0)
Platelets: 255 10*3/uL (ref 150–400)
RBC: 3.67 MIL/uL — ABNORMAL LOW (ref 4.22–5.81)
RDW: 13.9 % (ref 11.5–15.5)
WBC: 8.6 10*3/uL (ref 4.0–10.5)
nRBC: 0 % (ref 0.0–0.2)

## 2019-03-29 LAB — HEPARIN LEVEL (UNFRACTIONATED): Heparin Unfractionated: 0.34 IU/mL (ref 0.30–0.70)

## 2019-03-29 SURGERY — CORONARY STENT INTERVENTION
Anesthesia: LOCAL

## 2019-03-29 MED ORDER — LIDOCAINE HCL (PF) 1 % IJ SOLN
INTRAMUSCULAR | Status: DC | PRN
  Administered 2019-03-29 (×2): 2 mL

## 2019-03-29 MED ORDER — SODIUM CHLORIDE 0.9 % IV SOLN: 250.0000 mL | INTRAVENOUS | Status: DC | PRN

## 2019-03-29 MED ORDER — SODIUM CHLORIDE 0.9% FLUSH
3.0000 mL | Freq: Two times a day (BID) | INTRAVENOUS | Status: DC
  Administered 2019-03-29 – 2019-03-30 (×2): 3 mL via INTRAVENOUS

## 2019-03-29 MED ORDER — IOHEXOL 350 MG/ML SOLN
INTRAVENOUS | Status: DC | PRN
  Administered 2019-03-29: 11:00:00 210 mL

## 2019-03-29 MED ORDER — IOHEXOL 350 MG/ML SOLN
INTRAVENOUS | Status: AC
  Filled 2019-03-29: qty 1

## 2019-03-29 MED ORDER — HEPARIN (PORCINE) IN NACL 1000-0.9 UT/500ML-% IV SOLN
INTRAVENOUS | Status: AC
  Filled 2019-03-29: qty 1000

## 2019-03-29 MED ORDER — SODIUM CHLORIDE 0.9 % WEIGHT BASED INFUSION: 3.0000 mL/kg/h | INTRAVENOUS | Status: DC

## 2019-03-29 MED ORDER — SODIUM CHLORIDE 0.9 % WEIGHT BASED INFUSION: 1.0000 mL/kg/h | INTRAVENOUS | Status: DC

## 2019-03-29 MED ORDER — OXYCODONE HCL 5 MG PO TABS: 5.0000 mg | ORAL_TABLET | ORAL | Status: DC | PRN

## 2019-03-29 MED ORDER — MIDAZOLAM HCL 2 MG/2ML IJ SOLN
INTRAMUSCULAR | Status: DC | PRN
  Administered 2019-03-29: 1 mg via INTRAVENOUS

## 2019-03-29 MED ORDER — CARVEDILOL 3.125 MG PO TABS
3.1250 mg | ORAL_TABLET | Freq: Two times a day (BID) | ORAL | Status: DC
  Administered 2019-03-29 – 2019-03-30 (×2): 3.125 mg via ORAL
  Filled 2019-03-29 (×2): qty 1

## 2019-03-29 MED ORDER — VERAPAMIL HCL 2.5 MG/ML IV SOLN
INTRAVENOUS | Status: DC | PRN
  Administered 2019-03-29: 10:00:00 10 mL via INTRA_ARTERIAL

## 2019-03-29 MED ORDER — ASPIRIN 81 MG PO CHEW: 81.0000 mg | CHEWABLE_TABLET | Freq: Every day | ORAL | Status: DC

## 2019-03-29 MED ORDER — TICAGRELOR 90 MG PO TABS: 90.0000 mg | ORAL_TABLET | Freq: Two times a day (BID) | ORAL | Status: DC

## 2019-03-29 MED ORDER — SODIUM CHLORIDE 0.9% FLUSH: 3.0000 mL | Freq: Two times a day (BID) | INTRAVENOUS | Status: DC

## 2019-03-29 MED ORDER — CARVEDILOL 6.25 MG PO TABS
6.2500 mg | ORAL_TABLET | Freq: Two times a day (BID) | ORAL | Status: DC
  Administered 2019-03-29: 6.25 mg via ORAL
  Filled 2019-03-29: qty 1

## 2019-03-29 MED ORDER — HEPARIN (PORCINE) IN NACL 1000-0.9 UT/500ML-% IV SOLN
INTRAVENOUS | Status: AC
  Filled 2019-03-29: qty 500

## 2019-03-29 MED ORDER — SODIUM CHLORIDE 0.9% FLUSH: 3.0000 mL | INTRAVENOUS | Status: DC | PRN

## 2019-03-29 MED ORDER — SODIUM CHLORIDE 0.9% FLUSH
3.0000 mL | Freq: Two times a day (BID) | INTRAVENOUS | Status: DC
  Administered 2019-03-30: 3 mL via INTRAVENOUS

## 2019-03-29 MED ORDER — FENTANYL CITRATE (PF) 100 MCG/2ML IJ SOLN
INTRAMUSCULAR | Status: DC | PRN
  Administered 2019-03-29 (×2): 25 ug via INTRAVENOUS

## 2019-03-29 MED ORDER — NITROGLYCERIN 1 MG/10 ML FOR IR/CATH LAB
INTRA_ARTERIAL | Status: AC
  Filled 2019-03-29: qty 10

## 2019-03-29 MED ORDER — ACETAMINOPHEN 325 MG PO TABS: 650.0000 mg | ORAL_TABLET | ORAL | Status: DC | PRN

## 2019-03-29 MED ORDER — HEPARIN SODIUM (PORCINE) 1000 UNIT/ML IJ SOLN
INTRAMUSCULAR | Status: DC | PRN
  Administered 2019-03-29: 3000 [IU] via INTRAVENOUS
  Administered 2019-03-29: 2500 [IU] via INTRAVENOUS
  Administered 2019-03-29: 10000 [IU] via INTRAVENOUS
  Administered 2019-03-29: 2500 [IU] via INTRAVENOUS

## 2019-03-29 MED ORDER — HEPARIN (PORCINE) IN NACL 1000-0.9 UT/500ML-% IV SOLN
INTRAVENOUS | Status: DC | PRN
  Administered 2019-03-29 (×3): 500 mL

## 2019-03-29 MED ORDER — ASPIRIN 81 MG PO CHEW: 81.0000 mg | CHEWABLE_TABLET | ORAL | Status: DC

## 2019-03-29 MED ORDER — MIDAZOLAM HCL 2 MG/2ML IJ SOLN
INTRAMUSCULAR | Status: AC
  Filled 2019-03-29: qty 2

## 2019-03-29 MED ORDER — VERAPAMIL HCL 2.5 MG/ML IV SOLN
INTRAVENOUS | Status: AC
  Filled 2019-03-29: qty 2

## 2019-03-29 MED ORDER — HYDRALAZINE HCL 20 MG/ML IJ SOLN: 10.0000 mg | INTRAMUSCULAR | Status: AC | PRN

## 2019-03-29 MED ORDER — HEPARIN SODIUM (PORCINE) 5000 UNIT/ML IJ SOLN
5000.0000 [IU] | Freq: Three times a day (TID) | INTRAMUSCULAR | Status: DC
  Administered 2019-03-29 – 2019-03-31 (×5): 5000 [IU] via SUBCUTANEOUS
  Filled 2019-03-29 (×5): qty 1

## 2019-03-29 MED ORDER — FENTANYL CITRATE (PF) 100 MCG/2ML IJ SOLN
INTRAMUSCULAR | Status: AC
  Filled 2019-03-29: qty 2

## 2019-03-29 MED ORDER — HEPARIN SODIUM (PORCINE) 1000 UNIT/ML IJ SOLN
INTRAMUSCULAR | Status: AC
  Filled 2019-03-29: qty 1

## 2019-03-29 MED ORDER — NOREPINEPHRINE 4 MG/250ML-% IV SOLN
INTRAVENOUS | Status: AC
  Filled 2019-03-29: qty 250

## 2019-03-29 MED ORDER — LIDOCAINE HCL (PF) 1 % IJ SOLN
INTRAMUSCULAR | Status: AC
  Filled 2019-03-29: qty 30

## 2019-03-29 MED ORDER — NITROGLYCERIN 1 MG/10 ML FOR IR/CATH LAB
INTRA_ARTERIAL | Status: DC | PRN
  Administered 2019-03-29: 100 ug via INTRACORONARY

## 2019-03-29 MED ORDER — SODIUM CHLORIDE 0.9 % IV SOLN: INTRAVENOUS | Status: DC

## 2019-03-29 MED ORDER — ONDANSETRON HCL 4 MG/2ML IJ SOLN: 4.0000 mg | Freq: Four times a day (QID) | INTRAMUSCULAR | Status: DC | PRN

## 2019-03-29 MED ORDER — SODIUM CHLORIDE 0.9 % IV SOLN: INTRAVENOUS | Status: AC

## 2019-03-29 MED ORDER — LABETALOL HCL 5 MG/ML IV SOLN: 10.0000 mg | INTRAVENOUS | Status: AC | PRN

## 2019-03-29 SURGICAL SUPPLY — 27 items
BALLN SAPPHIRE 2.0X12 (BALLOONS) ×2
BALLN SAPPHIRE 2.5X12 (BALLOONS) ×2
BALLN ~~LOC~~ EMERGE MR 3.25X8 (BALLOONS) ×2
BALLOON SAPPHIRE 2.0X12 (BALLOONS) IMPLANT
BALLOON SAPPHIRE 2.5X12 (BALLOONS) IMPLANT
BALLOON ~~LOC~~ EMERGE MR 3.25X8 (BALLOONS) IMPLANT
CATH BALLN WEDGE 5F 110CM (CATHETERS) ×1 IMPLANT
CATH INFINITI JR4 5F (CATHETERS) ×1 IMPLANT
CATH VISTA GUIDE 6FR XBLAD3.5 (CATHETERS) ×1 IMPLANT
DEVICE RAD COMP TR BAND LRG (VASCULAR PRODUCTS) ×1 IMPLANT
ELECT DEFIB PAD ADLT CADENCE (PAD) ×1 IMPLANT
GLIDESHEATH SLEND A-KIT 6F 22G (SHEATH) ×2 IMPLANT
GUIDEWIRE .025 260CM (WIRE) ×1 IMPLANT
GUIDEWIRE INQWIRE 1.5J.035X260 (WIRE) IMPLANT
INQWIRE 1.5J .035X260CM (WIRE) ×2
KIT ENCORE 26 ADVANTAGE (KITS) ×1 IMPLANT
KIT HEART LEFT (KITS) ×2 IMPLANT
PACK CARDIAC CATHETERIZATION (CUSTOM PROCEDURE TRAY) ×2 IMPLANT
SHEATH GLIDE SLENDER 4/5FR (SHEATH) ×1 IMPLANT
STENT RESOLUTE ONYX 2.5X22 (Permanent Stent) ×1 IMPLANT
STENT RESOLUTE ONYX 2.75X15 (Permanent Stent) ×1 IMPLANT
STENT SYNERGY XD 2.75X12 (Permanent Stent) IMPLANT
SYNERGY XD 2.75X12 (Permanent Stent) ×2 IMPLANT
TRANSDUCER W/STOPCOCK (MISCELLANEOUS) ×2 IMPLANT
TUBING CIL FLEX 10 FLL-RA (TUBING) ×2 IMPLANT
WIRE ASAHI PROWATER 180CM (WIRE) ×2 IMPLANT
WIRE MICROINTRODUCER 60CM (WIRE) ×1 IMPLANT

## 2019-03-29 NOTE — Progress Notes (Signed)
Tony Saunders for Heparin Indication: atrial fibrillation  No Known Allergies  Patient Measurements: Height: 6' (182.9 cm) Weight: 195 lb 8 oz (88.7 kg) IBW/kg (Calculated) : 77.6 Heparin Dosing Weight: 86.2 kg   Vital Signs: Temp: 98.7 F (37.1 C) (03/11 0356) Temp Source: Oral (03/11 0356) BP: 105/69 (03/11 0700) Pulse Rate: 100 (03/11 0700)  Labs: Recent Labs    03/27/19 0706 03/27/19 0706 03/27/19 0801 03/27/19 1008 03/27/19 1315 03/27/19 1448 03/27/19 2252 03/27/19 2252 03/28/19 0714 03/28/19 1443 03/28/19 2214 03/29/19 0251  HGB 12.7*   < > 11.2*  --   --   --  10.6*   < >  --  11.5*  --  10.2*  HCT 39.8   < > 33.0*  --   --   --  32.7*  --   --  35.5*  --  31.6*  PLT 340   < >  --   --   --   --  275  --   --  285  --  255  APTT 30  --   --   --   --   --   --   --   --   --   --   --   LABPROT 14.4  --   --   --   --   --   --   --   --   --   --   --   INR 1.1  --   --   --   --   --   --   --   --   --   --   --   HEPARINUNFRC  --   --   --   --   --   --  0.10*  --  0.15*  --  0.18*  --   CREATININE 1.12   < > 0.80  --   --   --  0.91  --   --   --   --  1.00  TROPONINIHS 1,560*   < >  --  >27,000* >27,000* >27,000*  --   --   --   --   --   --    < > = values in this interval not displayed.    Estimated Creatinine Clearance: 84.1 mL/min (by C-G formula based on SCr of 1 mg/dL).   Medical History: Past Medical History:  Diagnosis Date  . Seizures ([place])    most recent today, 07/23/15    Assessment: 64 year old male presented on 03/27/2019 to [facility] as Code STEMI. Patient now s/p PCI to RCA, residual left sided disease noted and planned staged PCI tomorrow of LAD and left circumflex.   Heparin at goal, no bleeding issues noted. Cath today  Goal of Therapy:  Heparin level 0.3-0.7 units/ml Monitor platelets by anticoagulation protocol: Yes   Plan:  Heparin stopped post cath   Erin Hearing PharmD., BCPS Clinical  Pharmacist 03/29/2019 7:41 AM

## 2019-03-29 NOTE — H&P (View-Only) (Signed)
Progress Note  Patient Name: Tony Saunders Date of Encounter: 03/29/2019  Primary Cardiologist: New, Dr. Ellyn Hack  Subjective   No recurrent chest pain  Inpatient Medications    Scheduled Meds: . aspirin  81 mg Oral Daily  . atorvastatin  40 mg Oral q1800  . carvedilol  3.125 mg Oral BID WC  . Chlorhexidine Gluconate Cloth  6 each Topical Daily  . lamoTRIgine  100 mg Oral BID  . levETIRAcetam  750 mg Oral BID  . sertraline  25 mg Oral Daily  . sodium chloride flush  3 mL Intravenous Q12H  . sodium chloride flush  3 mL Intravenous Q12H  . ticagrelor  90 mg Oral BID   Continuous Infusions: . sodium chloride 20 mL/hr at 03/27/19 0733  . sodium chloride    . sodium chloride 75 mL/hr at 03/29/19 0600  . sodium chloride    . sodium chloride     Followed by  . sodium chloride    . heparin 1,800 Units/hr (03/29/19 0600)   PRN Meds: sodium chloride, sodium chloride, acetaminophen, morphine injection, ondansetron (ZOFRAN) IV, sodium chloride flush, sodium chloride flush   Vital Signs    Vitals:   03/29/19 0400 03/29/19 0500 03/29/19 0600 03/29/19 0700  BP: 106/77 101/64 107/69 105/69  Pulse: 96 (!) 105 (!) 107 100  Resp: (!) 29 (!) 26 (!) 31 (!) 29  Temp:      TempSrc:      SpO2: 96% 95% 95% 96%  Weight:   88.7 kg   Height:        Intake/Output Summary (Last 24 hours) at 03/29/2019 0757 Last data filed at 03/29/2019 0600 Gross per 24 hour  Intake 2269.07 ml  Output 700 ml  Net 1569.07 ml    I/O since admission: -23  Filed Weights   03/27/19 0657 03/29/19 0600  Weight: 86.2 kg 88.7 kg    Telemetry    Had AF yesterday; currently sinus rhythm - Personally Reviewed  ECG    03/29/2019 ECG (independently read by me): NSR 99; resolution of residual STE inferiorly with T wave inversion 2,3,aVF.  03/28/2019 ECG (independently read by me): Sinus tachycardia at 101; residual 1 mm ST elevation 2 3 and F with resolution anterolaterally.  QTc interval 456 ms  3/9  2021 ECG (independently read by me): Sinus tachycardia at 103, 1 mm inferolateral ST elevation; QTc interval 49 ms  Physical Exam    BP 105/69   Pulse 100   Temp 98.7 F (37.1 C) (Oral)   Resp (!) 29   Ht 6' (1.829 m)   Wt 88.7 kg   SpO2 96%   BMI 26.51 kg/m  General: Alert, oriented, no distress.  Skin: normal turgor, no rashes, warm and dry HEENT: Normocephalic, atraumatic. Pupils equal round and reactive to light; sclera anicteric; extraocular muscles intact;  Nose without nasal septal hypertrophy Mouth/Parynx benign;  Neck: No JVD, no carotid bruits; normal carotid upstroke Lungs: clear to ausculatation and percussion; no wheezing or rales Chest wall: without tenderness to palpitation Heart: PMI not displaced, RRR, s1 s2 normal, 1/6 systolic murmur, no diastolic murmur, no rubs, gallops, thrills, or heaves Abdomen: soft, nontender; no hepatosplenomehaly, BS+; abdominal aorta nontender and not dilated by palpation. Back: no CVA tenderness Pulses 2+ Musculoskeletal: full range of motion, normal strength, no joint deformities Extremities: no clubbing cyanosis or edema, Homan's sign negative  Neurologic: grossly nonfocal; Cranial nerves grossly wnl Psychologic: Normal mood and affect   Labs  Chemistry Recent Labs  Lab 03/27/19 0706 03/27/19 0706 03/27/19 0801 03/27/19 2252 03/29/19 0251  NA 137   < > 137 138 135  K 4.4   < > 3.9 5.0 4.1  CL 101   < > 104 106 105  CO2 22  --   --  23 21*  GLUCOSE 127*   < > 121* 104* 100*  BUN 13   < > 14 12 11   CREATININE 1.12   < > 0.80 0.91 1.00  CALCIUM 8.9  --   --  8.5* 8.2*  PROT 6.1*  --   --  5.6* 4.9*  ALBUMIN 3.0*  --   --  2.6* 2.3*  AST 133*  --   --  238* 118*  ALT 99*  --   --  95* 80*  ALKPHOS 81  --   --  69 66  BILITOT 0.5  --   --  0.6 0.5  GFRNONAA >60  --   --  >60 >60  GFRAA >60  --   --  >60 >60  ANIONGAP 14  --   --  9 9   < > = values in this interval not displayed.     Hematology Recent Labs    Lab 03/27/19 2252 03/28/19 1443 03/29/19 0251  WBC 7.8 9.8 8.6  RBC 3.77* 4.05* 3.67*  HGB 10.6* 11.5* 10.2*  HCT 32.7* 35.5* 31.6*  MCV 86.7 87.7 86.1  MCH 28.1 28.4 27.8  MCHC 32.4 32.4 32.3  RDW 13.6 13.8 13.9  PLT 275 285 255          Cardiac EnzymesNo results for input(s): TROPONINI in the last 168 hours. No results for input(s): TROPIPOC in the last 168 hours.   BNPNo results for input(s): BNP, PROBNP in the last 168 hours.   DDimer No results for input(s): DDIMER in the last 168 hours.   Lipid Panel     Component Value Date/Time   CHOL 124 03/27/2019 2252   TRIG 125 03/27/2019 2252   HDL 22 (L) 03/27/2019 2252   CHOLHDL 5.6 03/27/2019 2252   VLDL 25 03/27/2019 2252   LDLCALC 77 03/27/2019 2252     Radiology    ECHOCARDIOGRAM COMPLETE  Result Date: 03/28/2019    ECHOCARDIOGRAM REPORT   Patient Name:   Tony Saunders Date of Exam: 03/28/2019 Medical Rec #:  [id number]     Height:       72.0 in Accession #:    [id number]    Weight:       190.0 lb Date of Birth:  19-Apr-1955     BSA:          2.085 m Patient Age:    64 years      BP:           112/75 mmHg Patient Gender: M             HR:           104 bpm. Exam Location:  Inpatient Procedure: 2D Echo, Cardiac Doppler and Color Doppler Indications:    R07.89 Other chest pain  History:        Patient has no prior history of Echocardiogram examinations. CAD                 and Acute MI; Risk Factors:Dyslipidemia.  Sonographer:    Roseanna Rainbow RDCS Referring Phys: [id number] [facility]  Sonographer Comments: Technically difficult study due to poor echo windows. IMPRESSIONS  1. Left ventricular ejection fraction, by estimation, is 30 to 35%. The left ventricle has moderately decreased function. The left ventricle demonstrates regional wall motion abnormalities (see scoring diagram/findings for description). There is moderate left ventricular hypertrophy. Left ventricular diastolic parameters are consistent with Grade I diastolic  dysfunction (impaired relaxation). Elevated left ventricular end-diastolic pressure. There is severe hypokinesis of the left ventricular, entire inferior wall, inferoseptal wall, apical segment and anteroapical.  2. Right ventricular systolic function is normal. The right ventricular size is normal.  3. There is likely ischemic tethering of the posterior leaflet leading to the eccentric (posteriorly directed) MR jet. The mitral valve is abnormal. Moderate to severe mitral valve regurgitation.  4. The aortic valve is tricuspid. Aortic valve regurgitation is not visualized.  5. The inferior vena cava is dilated in size with <50% respiratory variability, suggesting right atrial pressure of 15 mmHg. FINDINGS  Left Ventricle: Left ventricular ejection fraction, by estimation, is 30 to 35%. The left ventricle has moderately decreased function. The left ventricle demonstrates regional wall motion abnormalities. Severe hypokinesis of the left ventricular, entire  inferior wall, inferoseptal wall, apical segment and anteroapical. The left ventricular internal cavity size was small. There is moderate left ventricular hypertrophy. Left ventricular diastolic parameters are consistent with Grade I diastolic dysfunction (impaired relaxation). Elevated left ventricular end-diastolic pressure. Right Ventricle: The right ventricular size is normal. No increase in right ventricular wall thickness. Right ventricular systolic function is normal. Left Atrium: Left atrial size was normal in size. Right Atrium: Right atrial size was normal in size. Pericardium: There is no evidence of pericardial effusion. Mitral Valve: There is likely ischemic tethering of the posterior leaflet leading to the eccentric (posteriorly directed) MR jet. The mitral valve is abnormal. There is mild thickening of the mitral valve leaflet(s). Mildly decreased mobility of the mitral valve leaflets. Moderate to severe mitral valve regurgitation, with  posteriorly-directed jet. Tricuspid Valve: The tricuspid valve is grossly normal. Tricuspid valve regurgitation is trivial. Aortic Valve: The aortic valve is tricuspid. Aortic valve regurgitation is not visualized. Pulmonic Valve: The pulmonic valve was grossly normal. Pulmonic valve regurgitation is not visualized. Aorta: The aortic root, ascending aorta, aortic arch and descending aorta are all structurally normal, with no evidence of dilitation or obstruction. Venous: The inferior vena cava is dilated in size with less than 50% respiratory variability, suggesting right atrial pressure of 15 mmHg. IAS/Shunts: No atrial level shunt detected by color flow Doppler.  LEFT VENTRICLE PLAX 2D LVIDd:         5.23 cm      Diastology LVIDs:         4.47 cm      LV e' lateral:   9.46 cm/s LV PW:         1.30 cm      LV E/e' lateral: 12.6 LV IVS:        1.30 cm      LV e' medial:    8.16 cm/s LVOT diam:     2.10 cm      LV E/e' medial:  14.6 LV SV:         52 LV SV Index:   25 LVOT Area:     3.46 cm  LV Volumes (MOD) LV vol d, MOD A2C: 165.0 ml LV vol d, MOD A4C: 157.0 ml LV vol s, MOD A2C: 117.0 ml LV vol s, MOD A4C: 99.8 ml LV SV MOD A2C:     48.0 ml LV SV MOD A4C:  157.0 ml LV SV MOD BP:      55.1 ml RIGHT VENTRICLE            IVC RV S prime:     8.86 cm/s  IVC diam: 3.23 cm TAPSE (M-mode): 2.2 cm LEFT ATRIUM             Index       RIGHT ATRIUM           Index LA diam:        3.80 cm 1.82 cm/m  RA Area:     13.80 cm LA Vol (A2C):   53.0 ml 25.42 ml/m RA Volume:   27.20 ml  13.05 ml/m LA Vol (A4C):   51.0 ml 24.46 ml/m LA Biplane Vol: 56.0 ml 26.86 ml/m  AORTIC VALVE LVOT Vmax:   82.00 cm/s LVOT Vmean:  58.700 cm/s LVOT VTI:    0.149 m  AORTA Ao Root diam: 3.30 cm Ao Asc diam:  3.20 cm MITRAL VALVE MV Area (PHT): 5.66 cm      SHUNTS MV Decel Time: 134 msec      Systemic VTI:  0.15 m MR Peak grad:    86.3 mmHg   Systemic Diam: 2.10 cm MR Mean grad:    47.5 mmHg MR Vmax:         464.50 cm/s MR Vmean:        302.5  cm/s MR PISA:         1.79 cm MR PISA Eff ROA: 13 mm MR PISA Radius:  0.53 cm MV E velocity: 119.00 cm/s Lyman Bishop MD Electronically signed by Lyman Bishop MD Signature Date/Time: 03/28/2019/12:25:35 PM    Final     Cardiac Studies    Prox RCA to Mid RCA stented segment is 15% stenosed.  CULPRIT LESION: Mid RCA to Dist RCA lesion is 100% stenosed with 80% stenosed side branch in RPDA. RPDA lesion is 60% stenosed.  A drug-eluting stent was successfully placed crossing the lesion and across the RPA V into the proximal PDA using a SYNERGY XD 3.0X38. This is postdilated from 3.6 to 3.2 mm.  A second drug-eluting stent was successfully placed overlapping the new stent and the old stent, using a SYNERGY XD 3.50X20. The entire segment was then postdilated to 3.7 mm.  Post intervention, there is a 0% residual stenosis in the entire stented segment.  -  RPAV lesion is 80% stenosed with 80% stenosed side branch in 2nd RPL.  Balloon angioplasty was performed from the ostium of the RPA V into the RPL 2, using a BALLOON SAPPHIRE 2.0X20.  Post intervention, there is a 0% residual stenosis from ostial PAV up to RPL 2.  Post intervention, the RPL 2 side branch was reduced to 10% residual stenosis.  ===============  Prox LAD to Mid LAD lesion is 85% stenosed. Mid LAD lesion is 65% stenosed with 0% stenosed side branch in 1st Sept.  Prox Cx lesion is 80% stenosed. Prox Cx to Mid Cx lesion is 70% stenosed.  ---- Hemodynamics------  There is severe left ventricular systolic dysfunction. The left ventricular ejection fraction is 25-35% by visual estimate.  LV end diastolic pressure is moderately elevated.  There is mild (2+) mitral regurgitation.  RPDA lesion is 60% stenosed.   SUMMARY  CULPRIT LESION: 100% thrombotic distal RCA distal to previous stents with 40 to 60% stenosis leading up to it from stents. There is post PTCA noted 80% lesions in the RP AV. ? Successful DES PCI of  distal RCA into PDA with stent overlapping back to previous stents: Synergy DES 3.0 mm x 38 mm distal, overlapped proximally with a Synergy DES 3.5 mm x 20 mm (postdilated in taper fashion from 3.6 to 3.2 mm)  Otherwise multivessel disease with 80% tapered to 60% proximal to mid LAD at takeoff of major diagonal branch and septal perforator.  80% proximal and 70% mid circumflex involving essentially trifurcation of OM 1, OM 2 and AV groove circumflex.  Severely reduced LVEF of roughly 30 to 35% with global hypokinesis-anterior as well as inferior (will need echo).  Normal EDP. Borderline hypotension, but stable.   RECOMMENDATIONS  Admit to inpatient-CCU. Run Aggrastat for roughly 2 hours until current bottle completed  Restart IV heparin 8 hours after sheath removal.  Will need 2D echocardiogram checked  Likely will require diuresis based on reduced EF  Will need to consider staged PCI of LAD and Circumflex-tentatively scheduled for Thursday with Dr. Tamala Julian  Current blood pressure too low to initiate GDMT with beta-blocker, ACE/ARB or spironolactone.  DAPT for minimum 1 year  High-dose high intensity statin-start beta-blocker when able  Restart antiseizure medications   ECHO 03/28/2019 IMPRESSIONS  1. Left ventricular ejection fraction, by estimation, is 30 to 35%. The  left ventricle has moderately decreased function. The left ventricle  demonstrates regional wall motion abnormalities (see scoring  diagram/findings for description). There is  moderate left ventricular hypertrophy. Left ventricular diastolic  parameters are consistent with Grade I diastolic dysfunction (impaired  relaxation). Elevated left ventricular end-diastolic pressure. There is  severe hypokinesis of the left ventricular,  entire inferior wall, inferoseptal wall, apical segment and anteroapical.  2. Right ventricular systolic function is normal. The right ventricular  size is normal.  3.  There is likely ischemic tethering of the posterior leaflet leading to  the eccentric (posteriorly directed) MR jet. The mitral valve is abnormal.  Moderate to severe mitral valve regurgitation.  4. The aortic valve is tricuspid. Aortic valve regurgitation is not  visualized.  5. The inferior vena cava is dilated in size with <50% respiratory  variability, suggesting right atrial pressure of 15 mmHg.   Patient Profile     Tony Saunders is a 64 y.o. male with history of epilepsy/seizure disorder and distant history of CAD with stents in the RCA who presents with sudden onset chest pain at 3 AM on 03/27/2019.  EKG upon arrival to ER at roughly 050 indicated inferior STEMI.  Code STEMI was called at 7:23 AM.  Assessment & Plan    1. Day 2 s/p Inferior STEMI: Patient history of prior RCA stenting.  Presented after being awakened from sleep at approximately 3 or 4 AM with severe chest pain and diaphoresis.  Angiographic studies personally reviewed.  Patient underwent successful stenting x2 of his distal RCA extending into the ostium of the PDA.  On DAPT with aspirin/Brilinta.  He was not started on beta-blocker therapy initiallydue to low blood pressure yesterday. Carvedilol started yesterday. On DAPT.  2.  Concomitant CAD: Tentatively scheduled for staged PCI to LAD and left circumflex today with Dr. Tamala Julian.  3. PAF: went into AF 3/10 in afternoon.  Currently sinus tachycardia at 100; will increase coreg as BP allows to 6.25 mg bid. Heparin resumed with AF; hold when called to cath lab today.  4. Ischemic cardiomyopathy: EF 30 -35%; GR I DD; moderately severe MR;  Post procedure GDMT as BP allows  5.  Transaminase elevation with AST/ ALT 238/95.  May be secondary  to MI.   Atorvastatin reduced to 40 mg. AST/ALT improved today at 118/80  6.  Hyperlipidemia: Target  LDL less than 70; on atorvastatin.  7.  Post procedure anemia: Hb 12.7->10.6/ HCT 39.8 - >32.7.  Heparin was dc'd yesterday but  re-started when he went into AF,  8.  History of seizures on Lamictal and Keppra  9.  Consider evaluation for obstructive sleep apnea since patient was awakened from sleep in the early morning most likely during REM sleep raising concern for significant REM sleep-related oxygen desaturation potentially in the setting of sleep apnea.  Signed, Troy Sine, MD, [facility] 03/29/2019, 7:57 AM

## 2019-03-29 NOTE — Interval H&P Note (Signed)
History and Physical Interval Note:  03/29/2019 9:29 AM  The patient presented with inferior STEMI on 03/27/2019.  Since that time he has had episodes of atrial fibrillation and heparin has been resumed.  Echocardiography after the STEMI demonstrated an EF of 30 to 35% and moderate to severe mitral regurgitation.  He has residual severe LAD disease and diffuse disease in the circumflex.  He was set up to have staged PCI of the LAD and circumflex.  Discussion with Dr. Claiborne Billings this morning including concern about mitral regurgitation and reduced LV function led to the decision to proceed with PCI.  The mitral regurgitation is possibly ischemia related and should improve with revascularization.  If it does not, he may need to have mitral valve repair possibly with MitraClip in the future if appropriate.  In discussion with the patient this morning, he is aware that PCI on top of his substrate is a higher risk procedure.  He prefers PCI over surgery as most people would.  We will plan to proceed with right radial.  Femoral access would be used if support is needed during the procedure.  I will measure LVEDP initially.   President Scurti  has presented today for surgery, with the diagnosis of cad.  The various methods of treatment have been discussed with the patient and family. After consideration of risks, benefits and other options for treatment, the patient has consented to  Procedure(s): CORONARY STENT INTERVENTION (N/A) as a surgical intervention.  The patient's history has been reviewed, patient examined, no change in status, stable for surgery.  I have reviewed the patient's chart and labs.  Questions were answered to the patient's satisfaction.     Belva Crome III

## 2019-03-29 NOTE — Progress Notes (Addendum)
Progress Note  Patient Name: Tony Saunders Date of Encounter: 03/29/2019  Primary Cardiologist: New, Dr. Ellyn Hack  Subjective   No recurrent chest pain  Inpatient Medications    Scheduled Meds: . aspirin  81 mg Oral Daily  . atorvastatin  40 mg Oral q1800  . carvedilol  3.125 mg Oral BID WC  . Chlorhexidine Gluconate Cloth  6 each Topical Daily  . lamoTRIgine  100 mg Oral BID  . levETIRAcetam  750 mg Oral BID  . sertraline  25 mg Oral Daily  . sodium chloride flush  3 mL Intravenous Q12H  . sodium chloride flush  3 mL Intravenous Q12H  . ticagrelor  90 mg Oral BID   Continuous Infusions: . sodium chloride 20 mL/hr at 03/27/19 0733  . sodium chloride    . sodium chloride 75 mL/hr at 03/29/19 0600  . sodium chloride    . sodium chloride     Followed by  . sodium chloride    . heparin 1,800 Units/hr (03/29/19 0600)   PRN Meds: sodium chloride, sodium chloride, acetaminophen, morphine injection, ondansetron (ZOFRAN) IV, sodium chloride flush, sodium chloride flush   Vital Signs    Vitals:   03/29/19 0400 03/29/19 0500 03/29/19 0600 03/29/19 0700  BP: 106/77 101/64 107/69 105/69  Pulse: 96 (!) 105 (!) 107 100  Resp: (!) 29 (!) 26 (!) 31 (!) 29  Temp:      TempSrc:      SpO2: 96% 95% 95% 96%  Weight:   88.7 kg   Height:        Intake/Output Summary (Last 24 hours) at 03/29/2019 0757 Last data filed at 03/29/2019 0600 Gross per 24 hour  Intake 2269.07 ml  Output 700 ml  Net 1569.07 ml    I/O since admission: -23  Filed Weights   03/27/19 0657 03/29/19 0600  Weight: 86.2 kg 88.7 kg    Telemetry    Had AF yesterday; currently sinus rhythm - Personally Reviewed  ECG    03/29/2019 ECG (independently read by me): NSR 99; resolution of residual STE inferiorly with T wave inversion 2,3,aVF.  03/28/2019 ECG (independently read by me): Sinus tachycardia at 101; residual 1 mm ST elevation 2 3 and F with resolution anterolaterally.  QTc interval 456 ms  3/9  2021 ECG (independently read by me): Sinus tachycardia at 103, 1 mm inferolateral ST elevation; QTc interval 49 ms  Physical Exam    BP 105/69   Pulse 100   Temp 98.7 F (37.1 C) (Oral)   Resp (!) 29   Ht 6' (1.829 m)   Wt 88.7 kg   SpO2 96%   BMI 26.51 kg/m  General: Alert, oriented, no distress.  Skin: normal turgor, no rashes, warm and dry HEENT: Normocephalic, atraumatic. Pupils equal round and reactive to light; sclera anicteric; extraocular muscles intact;  Nose without nasal septal hypertrophy Mouth/Parynx benign;  Neck: No JVD, no carotid bruits; normal carotid upstroke Lungs: clear to ausculatation and percussion; no wheezing or rales Chest wall: without tenderness to palpitation Heart: PMI not displaced, RRR, s1 s2 normal, 1/6 systolic murmur, no diastolic murmur, no rubs, gallops, thrills, or heaves Abdomen: soft, nontender; no hepatosplenomehaly, BS+; abdominal aorta nontender and not dilated by palpation. Back: no CVA tenderness Pulses 2+ Musculoskeletal: full range of motion, normal strength, no joint deformities Extremities: no clubbing cyanosis or edema, Homan's sign negative  Neurologic: grossly nonfocal; Cranial nerves grossly wnl Psychologic: Normal mood and affect   Labs  Chemistry Recent Labs  Lab 03/27/19 0706 03/27/19 0706 03/27/19 0801 03/27/19 2252 03/29/19 0251  NA 137   < > 137 138 135  K 4.4   < > 3.9 5.0 4.1  CL 101   < > 104 106 105  CO2 22  --   --  23 21*  GLUCOSE 127*   < > 121* 104* 100*  BUN 13   < > 14 12 11   CREATININE 1.12   < > 0.80 0.91 1.00  CALCIUM 8.9  --   --  8.5* 8.2*  PROT 6.1*  --   --  5.6* 4.9*  ALBUMIN 3.0*  --   --  2.6* 2.3*  AST 133*  --   --  238* 118*  ALT 99*  --   --  95* 80*  ALKPHOS 81  --   --  69 66  BILITOT 0.5  --   --  0.6 0.5  GFRNONAA >60  --   --  >60 >60  GFRAA >60  --   --  >60 >60  ANIONGAP 14  --   --  9 9   < > = values in this interval not displayed.     Hematology Recent Labs    Lab 03/27/19 2252 03/28/19 1443 03/29/19 0251  WBC 7.8 9.8 8.6  RBC 3.77* 4.05* 3.67*  HGB 10.6* 11.5* 10.2*  HCT 32.7* 35.5* 31.6*  MCV 86.7 87.7 86.1  MCH 28.1 28.4 27.8  MCHC 32.4 32.4 32.3  RDW 13.6 13.8 13.9  PLT 275 285 255          Cardiac EnzymesNo results for input(s): TROPONINI in the last 168 hours. No results for input(s): TROPIPOC in the last 168 hours.   BNPNo results for input(s): BNP, PROBNP in the last 168 hours.   DDimer No results for input(s): DDIMER in the last 168 hours.   Lipid Panel     Component Value Date/Time   CHOL 124 03/27/2019 2252   TRIG 125 03/27/2019 2252   HDL 22 (L) 03/27/2019 2252   CHOLHDL 5.6 03/27/2019 2252   VLDL 25 03/27/2019 2252   LDLCALC 77 03/27/2019 2252     Radiology    ECHOCARDIOGRAM COMPLETE  Result Date: 03/28/2019    ECHOCARDIOGRAM REPORT   Patient Name:   Tony Saunders Date of Exam: 03/28/2019 Medical Rec #:  [id number]     Height:       72.0 in Accession #:    [id number]    Weight:       190.0 lb Date of Birth:  1955/06/29     BSA:          2.085 m Patient Age:    64 years      BP:           112/75 mmHg Patient Gender: M             HR:           104 bpm. Exam Location:  Inpatient Procedure: 2D Echo, Cardiac Doppler and Color Doppler Indications:    R07.89 Other chest pain  History:        Patient has no prior history of Echocardiogram examinations. CAD                 and Acute MI; Risk Factors:Dyslipidemia.  Sonographer:    Roseanna Rainbow RDCS Referring Phys: [phone] [facility]  Sonographer Comments: Technically difficult study due to poor echo windows. IMPRESSIONS  1. Left ventricular ejection fraction, by estimation, is 30 to 35%. The left ventricle has moderately decreased function. The left ventricle demonstrates regional wall motion abnormalities (see scoring diagram/findings for description). There is moderate left ventricular hypertrophy. Left ventricular diastolic parameters are consistent with Grade I diastolic  dysfunction (impaired relaxation). Elevated left ventricular end-diastolic pressure. There is severe hypokinesis of the left ventricular, entire inferior wall, inferoseptal wall, apical segment and anteroapical.  2. Right ventricular systolic function is normal. The right ventricular size is normal.  3. There is likely ischemic tethering of the posterior leaflet leading to the eccentric (posteriorly directed) MR jet. The mitral valve is abnormal. Moderate to severe mitral valve regurgitation.  4. The aortic valve is tricuspid. Aortic valve regurgitation is not visualized.  5. The inferior vena cava is dilated in size with <50% respiratory variability, suggesting right atrial pressure of 15 mmHg. FINDINGS  Left Ventricle: Left ventricular ejection fraction, by estimation, is 30 to 35%. The left ventricle has moderately decreased function. The left ventricle demonstrates regional wall motion abnormalities. Severe hypokinesis of the left ventricular, entire  inferior wall, inferoseptal wall, apical segment and anteroapical. The left ventricular internal cavity size was small. There is moderate left ventricular hypertrophy. Left ventricular diastolic parameters are consistent with Grade I diastolic dysfunction (impaired relaxation). Elevated left ventricular end-diastolic pressure. Right Ventricle: The right ventricular size is normal. No increase in right ventricular wall thickness. Right ventricular systolic function is normal. Left Atrium: Left atrial size was normal in size. Right Atrium: Right atrial size was normal in size. Pericardium: There is no evidence of pericardial effusion. Mitral Valve: There is likely ischemic tethering of the posterior leaflet leading to the eccentric (posteriorly directed) MR jet. The mitral valve is abnormal. There is mild thickening of the mitral valve leaflet(s). Mildly decreased mobility of the mitral valve leaflets. Moderate to severe mitral valve regurgitation, with  posteriorly-directed jet. Tricuspid Valve: The tricuspid valve is grossly normal. Tricuspid valve regurgitation is trivial. Aortic Valve: The aortic valve is tricuspid. Aortic valve regurgitation is not visualized. Pulmonic Valve: The pulmonic valve was grossly normal. Pulmonic valve regurgitation is not visualized. Aorta: The aortic root, ascending aorta, aortic arch and descending aorta are all structurally normal, with no evidence of dilitation or obstruction. Venous: The inferior vena cava is dilated in size with less than 50% respiratory variability, suggesting right atrial pressure of 15 mmHg. IAS/Shunts: No atrial level shunt detected by color flow Doppler.  LEFT VENTRICLE PLAX 2D LVIDd:         5.23 cm      Diastology LVIDs:         4.47 cm      LV e' lateral:   9.46 cm/s LV PW:         1.30 cm      LV E/e' lateral: 12.6 LV IVS:        1.30 cm      LV e' medial:    8.16 cm/s LVOT diam:     2.10 cm      LV E/e' medial:  14.6 LV SV:         52 LV SV Index:   25 LVOT Area:     3.46 cm  LV Volumes (MOD) LV vol d, MOD A2C: 165.0 ml LV vol d, MOD A4C: 157.0 ml LV vol s, MOD A2C: 117.0 ml LV vol s, MOD A4C: 99.8 ml LV SV MOD A2C:     48.0 ml LV SV MOD A4C:  157.0 ml LV SV MOD BP:      55.1 ml RIGHT VENTRICLE            IVC RV S prime:     8.86 cm/s  IVC diam: 3.23 cm TAPSE (M-mode): 2.2 cm LEFT ATRIUM             Index       RIGHT ATRIUM           Index LA diam:        3.80 cm 1.82 cm/m  RA Area:     13.80 cm LA Vol (A2C):   53.0 ml 25.42 ml/m RA Volume:   27.20 ml  13.05 ml/m LA Vol (A4C):   51.0 ml 24.46 ml/m LA Biplane Vol: 56.0 ml 26.86 ml/m  AORTIC VALVE LVOT Vmax:   82.00 cm/s LVOT Vmean:  58.700 cm/s LVOT VTI:    0.149 m  AORTA Ao Root diam: 3.30 cm Ao Asc diam:  3.20 cm MITRAL VALVE MV Area (PHT): 5.66 cm      SHUNTS MV Decel Time: 134 msec      Systemic VTI:  0.15 m MR Peak grad:    86.3 mmHg   Systemic Diam: 2.10 cm MR Mean grad:    47.5 mmHg MR Vmax:         464.50 cm/s MR Vmean:        302.5  cm/s MR PISA:         1.79 cm MR PISA Eff ROA: 13 mm MR PISA Radius:  0.53 cm MV E velocity: 119.00 cm/s Lyman Bishop MD Electronically signed by Lyman Bishop MD Signature Date/Time: 03/28/2019/12:25:35 PM    Final     Cardiac Studies    Prox RCA to Mid RCA stented segment is 15% stenosed.  CULPRIT LESION: Mid RCA to Dist RCA lesion is 100% stenosed with 80% stenosed side branch in RPDA. RPDA lesion is 60% stenosed.  A drug-eluting stent was successfully placed crossing the lesion and across the RPA V into the proximal PDA using a SYNERGY XD 3.0X38. This is postdilated from 3.6 to 3.2 mm.  A second drug-eluting stent was successfully placed overlapping the new stent and the old stent, using a SYNERGY XD 3.50X20. The entire segment was then postdilated to 3.7 mm.  Post intervention, there is a 0% residual stenosis in the entire stented segment.  -  RPAV lesion is 80% stenosed with 80% stenosed side branch in 2nd RPL.  Balloon angioplasty was performed from the ostium of the RPA V into the RPL 2, using a BALLOON SAPPHIRE 2.0X20.  Post intervention, there is a 0% residual stenosis from ostial PAV up to RPL 2.  Post intervention, the RPL 2 side branch was reduced to 10% residual stenosis.  ===============  Prox LAD to Mid LAD lesion is 85% stenosed. Mid LAD lesion is 65% stenosed with 0% stenosed side branch in 1st Sept.  Prox Cx lesion is 80% stenosed. Prox Cx to Mid Cx lesion is 70% stenosed.  ---- Hemodynamics------  There is severe left ventricular systolic dysfunction. The left ventricular ejection fraction is 25-35% by visual estimate.  LV end diastolic pressure is moderately elevated.  There is mild (2+) mitral regurgitation.  RPDA lesion is 60% stenosed.   SUMMARY  CULPRIT LESION: 100% thrombotic distal RCA distal to previous stents with 40 to 60% stenosis leading up to it from stents. There is post PTCA noted 80% lesions in the RP AV. ? Successful DES PCI of  distal RCA into PDA with stent overlapping back to previous stents: Synergy DES 3.0 mm x 38 mm distal, overlapped proximally with a Synergy DES 3.5 mm x 20 mm (postdilated in taper fashion from 3.6 to 3.2 mm)  Otherwise multivessel disease with 80% tapered to 60% proximal to mid LAD at takeoff of major diagonal branch and septal perforator.  80% proximal and 70% mid circumflex involving essentially trifurcation of OM 1, OM 2 and AV groove circumflex.  Severely reduced LVEF of roughly 30 to 35% with global hypokinesis-anterior as well as inferior (will need echo).  Normal EDP. Borderline hypotension, but stable.   RECOMMENDATIONS  Admit to inpatient-CCU. Run Aggrastat for roughly 2 hours until current bottle completed  Restart IV heparin 8 hours after sheath removal.  Will need 2D echocardiogram checked  Likely will require diuresis based on reduced EF  Will need to consider staged PCI of LAD and Circumflex-tentatively scheduled for Thursday with Dr. Tamala Julian  Current blood pressure too low to initiate GDMT with beta-blocker, ACE/ARB or spironolactone.  DAPT for minimum 1 year  High-dose high intensity statin-start beta-blocker when able  Restart antiseizure medications   ECHO 03/28/2019 IMPRESSIONS  1. Left ventricular ejection fraction, by estimation, is 30 to 35%. The  left ventricle has moderately decreased function. The left ventricle  demonstrates regional wall motion abnormalities (see scoring  diagram/findings for description). There is  moderate left ventricular hypertrophy. Left ventricular diastolic  parameters are consistent with Grade I diastolic dysfunction (impaired  relaxation). Elevated left ventricular end-diastolic pressure. There is  severe hypokinesis of the left ventricular,  entire inferior wall, inferoseptal wall, apical segment and anteroapical.  2. Right ventricular systolic function is normal. The right ventricular  size is normal.  3.  There is likely ischemic tethering of the posterior leaflet leading to  the eccentric (posteriorly directed) MR jet. The mitral valve is abnormal.  Moderate to severe mitral valve regurgitation.  4. The aortic valve is tricuspid. Aortic valve regurgitation is not  visualized.  5. The inferior vena cava is dilated in size with <50% respiratory  variability, suggesting right atrial pressure of 15 mmHg.   Patient Profile     Tony Saunders is a 64 y.o. male with history of epilepsy/seizure disorder and distant history of CAD with stents in the RCA who presents with sudden onset chest pain at 3 AM on 03/27/2019.  EKG upon arrival to ER at roughly 050 indicated inferior STEMI.  Code STEMI was called at 7:23 AM.  Assessment & Plan    1. Day 2 s/p Inferior STEMI: Patient history of prior RCA stenting.  Presented after being awakened from sleep at approximately 3 or 4 AM with severe chest pain and diaphoresis.  Angiographic studies personally reviewed.  Patient underwent successful stenting x2 of his distal RCA extending into the ostium of the PDA.  On DAPT with aspirin/Brilinta.  He was not started on beta-blocker therapy initiallydue to low blood pressure yesterday. Carvedilol started yesterday. On DAPT.  2.  Concomitant CAD: Tentatively scheduled for staged PCI to LAD and left circumflex today with Dr. Tamala Julian.  3. PAF: went into AF 3/10 in afternoon.  Currently sinus tachycardia at 100; will increase coreg as BP allows to 6.25 mg bid. Heparin resumed with AF; hold when called to cath lab today.  4. Ischemic cardiomyopathy: EF 30 -35%; GR I DD; moderately severe MR;  Post procedure GDMT as BP allows  5.  Transaminase elevation with AST/ ALT 238/95.  May be secondary  to MI.   Atorvastatin reduced to 40 mg. AST/ALT improved today at 118/80  6.  Hyperlipidemia: Target  LDL less than 70; on atorvastatin.  7.  Post procedure anemia: Hb 12.7->10.6/ HCT 39.8 - >32.7.  Heparin was dc'd yesterday but  re-started when he went into AF,  8.  History of seizures on Lamictal and Keppra  9.  Consider evaluation for obstructive sleep apnea since patient was awakened from sleep in the early morning most likely during REM sleep raising concern for significant REM sleep-related oxygen desaturation potentially in the setting of sleep apnea.  Signed, Troy Sine, MD, [facility] 03/29/2019, 7:57 AM

## 2019-03-29 NOTE — Progress Notes (Signed)
CARDIAC REHAB PHASE I   PRE:  Rate/Rhythm: 102 STG    BP: sitting 105/69    SaO2: 97 RA  MODE:  Ambulation: 370 ft   POST:  Rate/Rhythm: 115 ST with PACs    BP: sitting 112/76     SaO2: 97 RA  Ambulated unit at slow pace (my request to avoid tachy/afib). No major c/o but does st he feels fatigue after walk, like "I ran a mile". VSS. Will f/u in am for the rest of education. [place], ACSM 03/29/2019 8:37 AM

## 2019-03-29 NOTE — Interval H&P Note (Signed)
Cath Lab Visit (complete for each Cath Lab visit)  Clinical Evaluation Leading to the Procedure:   ACS: No.  Non-ACS:    Anginal Classification: CCS III  Anti-ischemic medical therapy: Minimal Therapy (1 class of medications)  Non-Invasive Test Results: No non-invasive testing performed  Prior CABG: No previous CABG      History and Physical Interval Note:  03/29/2019 9:28 AM  Myrle Sheng  has presented today for surgery, with the diagnosis of cad.  The various methods of treatment have been discussed with the patient and family. After consideration of risks, benefits and other options for treatment, the patient has consented to  Procedure(s): CORONARY STENT INTERVENTION (N/A) as a surgical intervention.  The patient's history has been reviewed, patient examined, no change in status, stable for surgery.  I have reviewed the patient's chart and labs.  Questions were answered to the patient's satisfaction.     Belva Crome III

## 2019-03-29 NOTE — CV Procedure (Signed)
   Multivessel PCI, LAD and diffuse circumflex.  Right radial approach via real-time vascular ultrasound for access.  Hypotension and elevated LVEDP lead right heart cath via right antecubital vein using real-time vascular ultrasound.  Mixed venous O2 saturation was 63%.  Cardiac output was felt to be adequate.  V wave to 35 mmHg.  After consultation with Dr. Claiborne Billings and Dr. Haroldine Laws decided to proceed with double vessel PCI and no hemodynamic support.  An 6 [place] XB LAD 3.5 cm guide catheter was used to advance a wire down the LAD.  Predilatation was performed with a 2.5 x 12 mm balloon.  A 12 mm x 2.75 Synergy was positioned proximal to the distal branch point and deployed at 14 atm x 2.  Proximal postdilatation using a 3.25 x 8 mm balloon to 14 atm was performed x1.  Final angiographic result was felt to be very acceptable.  TIMI grade III flow was noted.  The more distal bifurcation was not disrupted.  Next the circumflex was wired and predilated with a 2.0 balloon.  2 separate stents were placed.  A 22 x 2.5 mm Onyx was positioned distally and extended proximally to the distal border of a small fusiform region of ectasia and deployed at 12 atm.  Further deployment to 14 atm was performed.  TIMI grade III flow was noted.  A second stent was placed in a nonoverlapping fashion in the proximal to mid circumflex using a 2.75 x 15 mm Onyx deployed at 14 atm x 2.  Proximal postdilatation was performed with an 8 mm x 3.25 mm balloon to 12 atm.  A nice angiographic result was obtained.  In both LAD and circumflex territory, balloon inflations led to quick development of hypotension with systolic pressures dropping less than 60 mmHg in absence of chest pain or complaints.  Balloon inflations were kept brief.

## 2019-03-30 LAB — CBC
HCT: 32.5 % — ABNORMAL LOW (ref 39.0–52.0)
Hemoglobin: 10.6 g/dL — ABNORMAL LOW (ref 13.0–17.0)
MCH: 28 pg (ref 26.0–34.0)
MCHC: 32.6 g/dL (ref 30.0–36.0)
MCV: 86 fL (ref 80.0–100.0)
Platelets: 281 10*3/uL (ref 150–400)
RBC: 3.78 MIL/uL — ABNORMAL LOW (ref 4.22–5.81)
RDW: 14.1 % (ref 11.5–15.5)
WBC: 8.9 10*3/uL (ref 4.0–10.5)
nRBC: 0 % (ref 0.0–0.2)

## 2019-03-30 LAB — BASIC METABOLIC PANEL
Anion gap: 11 (ref 5–15)
BUN: 11 mg/dL (ref 8–23)
CO2: 21 mmol/L — ABNORMAL LOW (ref 22–32)
Calcium: 8.4 mg/dL — ABNORMAL LOW (ref 8.9–10.3)
Chloride: 103 mmol/L (ref 98–111)
Creatinine, Ser: 1.11 mg/dL (ref 0.61–1.24)
GFR calc Af Amer: >60 mL/min (ref >60)
GFR calc non Af Amer: >60 mL/min (ref >60)
Glucose, Bld: 89 mg/dL (ref 70–99)
Potassium: 4.3 mmol/L (ref 3.5–5.1)
Sodium: 135 mmol/L (ref 135–145)

## 2019-03-30 MED ORDER — CARVEDILOL 3.125 MG PO TABS
3.1250 mg | ORAL_TABLET | Freq: Once | ORAL | Status: AC
  Administered 2019-03-30: 3.125 mg via ORAL
  Filled 2019-03-30: qty 1

## 2019-03-30 MED ORDER — LOSARTAN POTASSIUM 25 MG PO TABS
12.5000 mg | ORAL_TABLET | Freq: Every day | ORAL | Status: DC
  Administered 2019-03-30 – 2019-03-31 (×2): 12.5 mg via ORAL
  Filled 2019-03-30 (×2): qty 1

## 2019-03-30 MED ORDER — CARVEDILOL 6.25 MG PO TABS
6.2500 mg | ORAL_TABLET | Freq: Two times a day (BID) | ORAL | Status: DC
  Administered 2019-03-30 – 2019-03-31 (×2): 6.25 mg via ORAL
  Filled 2019-03-30 (×2): qty 1

## 2019-03-30 MED FILL — Norepinephrine-Dextrose IV Solution 4 MG/250ML-5%: INTRAVENOUS | Qty: 250 | Status: AC

## 2019-03-30 NOTE — Progress Notes (Signed)
CARDIAC REHAB PHASE I   PRE:  Rate/Rhythm: 100 ST with PVCs    BP: sitting 103/67    SaO2:   MODE:  Ambulation: 600 ft   POST:  Rate/Rhythm: 110 ST    BP: sitting 105/67     SaO2: 99 RA  Tolerated well. No major c/o, just feels tired after walking. HR and BP stable. Encouraged more walking with staff or wife. [place], ACSM 03/30/2019 1:27 PM

## 2019-03-30 NOTE — Progress Notes (Signed)
Pt tired right now and declined ambulation. Sts he walked well this am with RN. Ed completed with pt and wife. Very receptive. Discussed MI, stents, Brilinta, restrictions, daily wts, low sodium and HH diet, exercise, NTG and CRPII. Gave HF booklet and other materials. Will refer to [place]. Interested in virtual and [place]. Pt is interested in participating in Virtual Cardiac and Pulmonary Rehab. Pt advised that Virtual Cardiac and Pulmonary Rehab is provided at no cost to the patient.  Checklist:  1. Pt has smart device  ie smartphone and/or ipad for downloading an app  Yes 2. Reliable internet/wifi service    Yes 3. Understands how to use their smartphone and navigate within an app.  Yes Pt verbalized understanding and is in agreement.  I will f/u after lunch for ambulation and to watch rhythm. [place], ACSM 11:21 AM 03/30/2019

## 2019-03-30 NOTE — Plan of Care (Signed)
  Problem: Education: Goal: Knowledge of General Education information will improve Description: Including pain rating scale, medication(s)/side effects and non-pharmacologic comfort measures Outcome: Progressing   Problem: Health Behavior/Discharge Planning: Goal: Ability to manage health-related needs will improve Outcome: Progressing   Problem: Clinical Measurements: Goal: Ability to maintain clinical measurements within normal limits will improve Outcome: Progressing Goal: Will remain free from infection Outcome: Progressing Goal: Diagnostic test results will improve Outcome: Progressing Goal: Respiratory complications will improve Outcome: Progressing   Problem: Activity: Goal: Risk for activity intolerance will decrease Outcome: Progressing   Problem: Nutrition: Goal: Adequate nutrition will be maintained Outcome: Progressing   Problem: Coping: Goal: Level of anxiety will decrease Outcome: Progressing   Problem: Elimination: Goal: Will not experience complications related to bowel motility Outcome: Progressing Goal: Will not experience complications related to urinary retention Outcome: Progressing   Problem: Safety: Goal: Ability to remain free from injury will improve Outcome: Progressing   Problem: Skin Integrity: Goal: Risk for impaired skin integrity will decrease Outcome: Progressing   Problem: Education: Goal: Understanding of CV disease, CV risk reduction, and recovery process will improve Outcome: Progressing Goal: Individualized Educational Video(s) Outcome: Progressing   Problem: Activity: Goal: Ability to return to baseline activity level will improve Outcome: Progressing   Problem: Cardiovascular: Goal: Ability to achieve and maintain adequate cardiovascular perfusion will improve Outcome: Progressing Goal: Vascular access site(s) Level 0-1 will be maintained Outcome: Progressing   Problem: Health Behavior/Discharge Planning: Goal:  Ability to safely manage health-related needs after discharge will improve Outcome: Progressing   Problem: Health Behavior/Discharge Planning: Goal: Ability to safely manage health-related needs after discharge will improve Outcome: Progressing

## 2019-03-30 NOTE — Progress Notes (Signed)
Progress Note  Patient Name: Tony Saunders Date of Encounter: 03/30/2019  Primary Cardiologist: New, Dr. Ellyn Hack  Subjective   Feels much improved following two-vessel complex coronary intervention yesterday.  No recurrent chest pain  Inpatient Medications    Scheduled Meds: . aspirin  81 mg Oral Daily  . atorvastatin  40 mg Oral q1800  . carvedilol  3.125 mg Oral BID WC  . Chlorhexidine Gluconate Cloth  6 each Topical Daily  . heparin  5,000 Units Subcutaneous Q8H  . lamoTRIgine  100 mg Oral BID  . levETIRAcetam  750 mg Oral BID  . sertraline  25 mg Oral Daily  . sodium chloride flush  3 mL Intravenous Q12H  . sodium chloride flush  3 mL Intravenous Q12H  . sodium chloride flush  3 mL Intravenous Q12H  . ticagrelor  90 mg Oral BID   Continuous Infusions: . sodium chloride 20 mL/hr at 03/27/19 0733  . sodium chloride    . sodium chloride 75 mL/hr at 03/29/19 0853  . sodium chloride     PRN Meds: sodium chloride, sodium chloride, acetaminophen, morphine injection, ondansetron (ZOFRAN) IV, oxyCODONE, sodium chloride flush, sodium chloride flush   Vital Signs    Vitals:   03/30/19 0500 03/30/19 0600 03/30/19 0700 03/30/19 0804  BP: 104/65 120/74 110/75 120/67  Pulse: 100 (!) 106 (!) 107 (!) 104  Resp: (!) 27 (!) 22 20   Temp:      TempSrc:      SpO2: 94% 97% 91%   Weight:  88.7 kg    Height:        Intake/Output Summary (Last 24 hours) at 03/30/2019 0808 Last data filed at 03/30/2019 0700 Gross per 24 hour  Intake 356.9 ml  Output 950 ml  Net -593.1 ml    I/O since admission: +1002  Filed Weights   03/27/19 0657 03/29/19 0600 03/30/19 0600  Weight: 86.2 kg 88.7 kg 88.7 kg    Telemetry    No recurrent AF, maintaining sinus - Personally Reviewed  ECG    03/30/2019 ECG (independently read by me): Sinus rhythm at 100 bpm, small inferior Q waves.  Mild T wave inversion inferiorly.  03/29/2019 ECG (independently read by me): NSR 99; resolution of  residual STE inferiorly with T wave inversion 2,3,aVF.  03/28/2019 ECG (independently read by me): Sinus tachycardia at 101; residual 1 mm ST elevation 2 3 and F with resolution anterolaterally.  QTc interval 456 ms  3/9 2021 ECG (independently read by me): Sinus tachycardia at 103, 1 mm inferolateral ST elevation; QTc interval 49 ms  Physical Exam   BP 120/67   Pulse (!) 104   Temp 98 F (36.7 C) (Oral)   Resp 20   Ht 6' (1.829 m)   Wt 88.7 kg   SpO2 91%   BMI 26.53 kg/m  General: Alert, oriented, no distress.  Skin: normal turgor, no rashes, warm and dry; multiple tattoos HEENT: Normocephalic, atraumatic. Pupils equal round and reactive to light; sclera anicteric; extraocular muscles intact;  Nose without nasal septal hypertrophy Mouth/Parynx benign;  Neck: No JVD, no carotid bruits; normal carotid upstroke Lungs: clear to ausculatation and percussion; no wheezing or rales Chest wall: without tenderness to palpitation Heart: PMI not displaced, RRR, s1 s2 normal, 1/6 systolic murmur, no diastolic murmur, no rubs, gallops, thrills, or heaves Abdomen: soft, nontender; no hepatosplenomehaly, BS+; abdominal aorta nontender and not dilated by palpation. Back: no CVA tenderness Pulses 2+ right radial cath site stable Musculoskeletal: full  range of motion, normal strength, no joint deformities Extremities: no clubbing cyanosis or edema, Homan's sign negative  Neurologic: grossly nonfocal; Cranial nerves grossly wnl Psychologic: Normal mood and affect    Labs    Chemistry Recent Labs  Lab 03/27/19 0706 03/27/19 0801 03/27/19 2252 03/27/19 2252 03/29/19 0251 03/29/19 1005 03/29/19 1008 03/29/19 1012 03/30/19 0305  NA 137   < > 138   < > 135   < > 134* 135 135  K 4.4   < > 5.0   < > 4.1   < > 4.1 4.1 4.3  CL 101   < > 106  --  105  --   --   --  103  CO2 22   < > 23  --  21*  --   --   --  21*  GLUCOSE 127*   < > 104*  --  100*  --   --   --  89  BUN 13   < > 12  --  11   --   --   --  11  CREATININE 1.12   < > 0.91  --  1.00  --   --   --  1.11  CALCIUM 8.9   < > 8.5*  --  8.2*  --   --   --  8.4*  PROT 6.1*  --  5.6*  --  4.9*  --   --   --   --   ALBUMIN 3.0*  --  2.6*  --  2.3*  --   --   --   --   AST 133*  --  238*  --  118*  --   --   --   --   ALT 99*  --  95*  --  80*  --   --   --   --   ALKPHOS 81  --  69  --  66  --   --   --   --   BILITOT 0.5  --  0.6  --  0.5  --   --   --   --   GFRNONAA >60   < > >60  --  >60  --   --   --  >60  GFRAA >60   < > >60  --  >60  --   --   --  >60  ANIONGAP 14   < > 9  --  9  --   --   --  11   < > = values in this interval not displayed.     Hematology Recent Labs  Lab 03/28/19 1443 03/28/19 1443 03/29/19 0251 03/29/19 1005 03/29/19 1008 03/29/19 1012 03/30/19 0305  WBC 9.8  --  8.6  --   --   --  8.9  RBC 4.05*  --  3.67*  --   --   --  3.78*  HGB 11.5*   < > 10.2*   < > 9.9* 9.9* 10.6*  HCT 35.5*   < > 31.6*   < > 29.0* 29.0* 32.5*  MCV 87.7  --  86.1  --   --   --  86.0  MCH 28.4  --  27.8  --   --   --  28.0  MCHC 32.4  --  32.3  --   --   --  32.6  RDW 13.8  --  13.9  --   --   --  14.1  PLT 285  --  255  --   --   --  281   < > = values in this interval not displayed.          Cardiac EnzymesNo results for input(s): TROPONINI in the last 168 hours. No results for input(s): TROPIPOC in the last 168 hours.   BNPNo results for input(s): BNP, PROBNP in the last 168 hours.   DDimer No results for input(s): DDIMER in the last 168 hours.   Lipid Panel     Component Value Date/Time   CHOL 124 03/27/2019 2252   TRIG 125 03/27/2019 2252   HDL 22 (L) 03/27/2019 2252   CHOLHDL 5.6 03/27/2019 2252   VLDL 25 03/27/2019 2252   LDLCALC 77 03/27/2019 2252     Radiology    CARDIAC CATHETERIZATION  Result Date: 03/29/2019  A stent was successfully placed.  A stent was successfully placed.  A stent was successfully placed.  A stent was successfully placed.   Hypotension with elevated  filling pressures.  Mildly elevated pulmonary artery pressures.  Mixed venous O2 saturation 66%.  Cardiac output 5.8 L/min.  Mean capillary wedge pressure 20 mmHg with V wave to 30 mmHg.  LVEDP 23 mmHg.  Successful LAD stent using a 12 mm 2.5 Synergy postdilated to 3.25 mm proximally and reducing the 85% stenosis to 0% with TIMI grade III flow.  Successful stenting of the circumflex using 2 nonoverlapping stents.  2.5 x 22 mm Onyx placed mid to distal extending into a large third obtuse marginal branch resulting in reduction of 70% stenosis to 0% with TIMI grade III flow.  The proximal lesion was stented using a second 15 x 2.75 mm Onyx deployed at 14 atm and not requiring postdilatation, reducing an 85% stenosis to 0% with TIMI grade III flow. RECOMMENDATIONS:  Aspirin and Brilinta for at least 12 months.  Would recommend de-escalation of aspirin to Brilinta low-dose or Plavix at the end of 12 months.  Consideration of MitraClip or surgical alternative therapy for the mitral valve if significant regurgitation remains after revascularization in several months.  Aggressive risk factor modification.  We will leave to the primary team the question of systemic anticoagulation given an episode of atrial fibrillation which occurred last night.   ECHOCARDIOGRAM COMPLETE  Result Date: 03/28/2019    ECHOCARDIOGRAM REPORT   Patient Name:   LADARRELL CORNWALL Date of Exam: 03/28/2019 Medical Rec #:  [id number]     Height:       72.0 in Accession #:    [id number]    Weight:       190.0 lb Date of Birth:  11-28-1955     BSA:          2.085 m Patient Age:    64 years      BP:           112/75 mmHg Patient Gender: M             HR:           104 bpm. Exam Location:  Inpatient Procedure: 2D Echo, Cardiac Doppler and Color Doppler Indications:    R07.89 Other chest pain  History:        Patient has no prior history of Echocardiogram examinations. CAD                 and Acute MI; Risk Factors:Dyslipidemia.  Sonographer:     [place] Referring Phys: [place]  Sonographer Comments: Technically difficult study due to poor echo windows. IMPRESSIONS  1. Left ventricular ejection fraction, by estimation, is 30 to 35%. The left ventricle has moderately decreased function. The left ventricle demonstrates regional wall motion abnormalities (see scoring diagram/findings for description). There is moderate left ventricular hypertrophy. Left ventricular diastolic parameters are consistent with Grade I diastolic dysfunction (impaired relaxation). Elevated left ventricular end-diastolic pressure. There is severe hypokinesis of the left ventricular, entire inferior wall, inferoseptal wall, apical segment and anteroapical.  2. Right ventricular systolic function is normal. The right ventricular size is normal.  3. There is likely ischemic tethering of the posterior leaflet leading to the eccentric (posteriorly directed) MR jet. The mitral valve is abnormal. Moderate to severe mitral valve regurgitation.  4. The aortic valve is tricuspid. Aortic valve regurgitation is not visualized.  5. The inferior vena cava is dilated in size with <50% respiratory variability, suggesting right atrial pressure of 15 mmHg. FINDINGS  Left Ventricle: Left ventricular ejection fraction, by estimation, is 30 to 35%. The left ventricle has moderately decreased function. The left ventricle demonstrates regional wall motion abnormalities. Severe hypokinesis of the left ventricular, entire  inferior wall, inferoseptal wall, apical segment and anteroapical. The left ventricular internal cavity size was small. There is moderate left ventricular hypertrophy. Left ventricular diastolic parameters are consistent with Grade I diastolic dysfunction (impaired relaxation). Elevated left ventricular end-diastolic pressure. Right Ventricle: The right ventricular size is normal. No increase in right ventricular wall thickness. Right ventricular systolic function is  normal. Left Atrium: Left atrial size was normal in size. Right Atrium: Right atrial size was normal in size. Pericardium: There is no evidence of pericardial effusion. Mitral Valve: There is likely ischemic tethering of the posterior leaflet leading to the eccentric (posteriorly directed) MR jet. The mitral valve is abnormal. There is mild thickening of the mitral valve leaflet(s). Mildly decreased mobility of the mitral valve leaflets. Moderate to severe mitral valve regurgitation, with posteriorly-directed jet. Tricuspid Valve: The tricuspid valve is grossly normal. Tricuspid valve regurgitation is trivial. Aortic Valve: The aortic valve is tricuspid. Aortic valve regurgitation is not visualized. Pulmonic Valve: The pulmonic valve was grossly normal. Pulmonic valve regurgitation is not visualized. Aorta: The aortic root, ascending aorta, aortic arch and descending aorta are all structurally normal, with no evidence of dilitation or obstruction. Venous: The inferior vena cava is dilated in size with less than 50% respiratory variability, suggesting right atrial pressure of 15 mmHg. IAS/Shunts: No atrial level shunt detected by color flow Doppler.  LEFT VENTRICLE PLAX 2D LVIDd:         5.23 cm      Diastology LVIDs:         4.47 cm      LV e' lateral:   9.46 cm/s LV PW:         1.30 cm      LV E/e' lateral: 12.6 LV IVS:        1.30 cm      LV e' medial:    8.16 cm/s LVOT diam:     2.10 cm      LV E/e' medial:  14.6 LV SV:         52 LV SV Index:   25 LVOT Area:     3.46 cm  LV Volumes (MOD) LV vol d, MOD A2C: 165.0 ml LV vol d, MOD A4C: 157.0 ml LV vol s, MOD A2C: 117.0 ml LV vol s, MOD A4C: 99.8 ml LV SV MOD A2C:  48.0 ml LV SV MOD A4C:     157.0 ml LV SV MOD BP:      55.1 ml RIGHT VENTRICLE            IVC RV S prime:     8.86 cm/s  IVC diam: 3.23 cm TAPSE (M-mode): 2.2 cm LEFT ATRIUM             Index       RIGHT ATRIUM           Index LA diam:        3.80 cm 1.82 cm/m  RA Area:     13.80 cm LA Vol (A2C):    53.0 ml 25.42 ml/m RA Volume:   27.20 ml  13.05 ml/m LA Vol (A4C):   51.0 ml 24.46 ml/m LA Biplane Vol: 56.0 ml 26.86 ml/m  AORTIC VALVE LVOT Vmax:   82.00 cm/s LVOT Vmean:  58.700 cm/s LVOT VTI:    0.149 m  AORTA Ao Root diam: 3.30 cm Ao Asc diam:  3.20 cm MITRAL VALVE MV Area (PHT): 5.66 cm      SHUNTS MV Decel Time: 134 msec      Systemic VTI:  0.15 m MR Peak grad:    86.3 mmHg   Systemic Diam: 2.10 cm MR Mean grad:    47.5 mmHg MR Vmax:         464.50 cm/s MR Vmean:        302.5 cm/s MR PISA:         1.79 cm MR PISA Eff ROA: 13 mm MR PISA Radius:  0.53 cm MV E velocity: 119.00 cm/s Lyman Bishop MD Electronically signed by Lyman Bishop MD Signature Date/Time: 03/28/2019/12:25:35 PM    Final     Cardiac Studies    Prox RCA to Mid RCA stented segment is 15% stenosed.  CULPRIT LESION: Mid RCA to Dist RCA lesion is 100% stenosed with 80% stenosed side branch in RPDA. RPDA lesion is 60% stenosed.  A drug-eluting stent was successfully placed crossing the lesion and across the RPA V into the proximal PDA using a SYNERGY XD 3.0X38. This is postdilated from 3.6 to 3.2 mm.  A second drug-eluting stent was successfully placed overlapping the new stent and the old stent, using a SYNERGY XD 3.50X20. The entire segment was then postdilated to 3.7 mm.  Post intervention, there is a 0% residual stenosis in the entire stented segment.  -  RPAV lesion is 80% stenosed with 80% stenosed side branch in 2nd RPL.  Balloon angioplasty was performed from the ostium of the RPA V into the RPL 2, using a BALLOON SAPPHIRE 2.0X20.  Post intervention, there is a 0% residual stenosis from ostial PAV up to RPL 2.  Post intervention, the RPL 2 side branch was reduced to 10% residual stenosis.  ===============  Prox LAD to Mid LAD lesion is 85% stenosed. Mid LAD lesion is 65% stenosed with 0% stenosed side branch in 1st Sept.  Prox Cx lesion is 80% stenosed. Prox Cx to Mid Cx lesion is 70% stenosed.  ----  Hemodynamics------  There is severe left ventricular systolic dysfunction. The left ventricular ejection fraction is 25-35% by visual estimate.  LV end diastolic pressure is moderately elevated.  There is mild (2+) mitral regurgitation.  RPDA lesion is 60% stenosed.   SUMMARY  CULPRIT LESION: 100% thrombotic distal RCA distal to previous stents with 40 to 60% stenosis leading up to it from stents. There is post PTCA noted  80% lesions in the RP AV. ? Successful DES PCI of distal RCA into PDA with stent overlapping back to previous stents: Synergy DES 3.0 mm x 38 mm distal, overlapped proximally with a Synergy DES 3.5 mm x 20 mm (postdilated in taper fashion from 3.6 to 3.2 mm)  Otherwise multivessel disease with 80% tapered to 60% proximal to mid LAD at takeoff of major diagonal branch and septal perforator.  80% proximal and 70% mid circumflex involving essentially trifurcation of OM 1, OM 2 and AV groove circumflex.  Severely reduced LVEF of roughly 30 to 35% with global hypokinesis-anterior as well as inferior (will need echo).  Normal EDP. Borderline hypotension, but stable.   RECOMMENDATIONS  Admit to inpatient-CCU. Run Aggrastat for roughly 2 hours until current bottle completed  Restart IV heparin 8 hours after sheath removal.  Will need 2D echocardiogram checked  Likely will require diuresis based on reduced EF  Will need to consider staged PCI of LAD and Circumflex-tentatively scheduled for Thursday with Dr. Tamala Julian  Current blood pressure too low to initiate GDMT with beta-blocker, ACE/ARB or spironolactone.  DAPT for minimum 1 year  High-dose high intensity statin-start beta-blocker when able  Restart antiseizure medications   ECHO 03/28/2019 IMPRESSIONS  1. Left ventricular ejection fraction, by estimation, is 30 to 35%. The  left ventricle has moderately decreased function. The left ventricle  demonstrates regional wall motion abnormalities (see  scoring  diagram/findings for description). There is  moderate left ventricular hypertrophy. Left ventricular diastolic  parameters are consistent with Grade I diastolic dysfunction (impaired  relaxation). Elevated left ventricular end-diastolic pressure. There is  severe hypokinesis of the left ventricular,  entire inferior wall, inferoseptal wall, apical segment and anteroapical.  2. Right ventricular systolic function is normal. The right ventricular  size is normal.  3. There is likely ischemic tethering of the posterior leaflet leading to  the eccentric (posteriorly directed) MR jet. The mitral valve is abnormal.  Moderate to severe mitral valve regurgitation.  4. The aortic valve is tricuspid. Aortic valve regurgitation is not  visualized.  5. The inferior vena cava is dilated in size with <50% respiratory  variability, suggesting right atrial pressure of 15 mmHg.     Right heart cath and two-vessel PCI March 29, 2019  A stent was successfully placed.  A stent was successfully placed.  A stent was successfully placed.  A stent was successfully placed.    Hypotension with elevated filling pressures.  Mildly elevated pulmonary artery pressures.  Mixed venous O2 saturation 66%.  Cardiac output 5.8 L/min.  Mean capillary wedge pressure 20 mmHg with V wave to 30 mmHg.  LVEDP 23 mmHg.  Successful LAD stent using a 12 mm 2.5 Synergy postdilated to 3.25 mm proximally and reducing the 85% stenosis to 0% with TIMI grade III flow.  Successful stenting of the circumflex using 2 nonoverlapping stents.  2.5 x 22 mm Onyx placed mid to distal extending into a large third obtuse marginal branch resulting in reduction of 70% stenosis to 0% with TIMI grade III flow.  The proximal lesion was stented using a second 15 x 2.75 mm Onyx deployed at 14 atm and not requiring postdilatation, reducing an 85% stenosis to 0% with TIMI grade III flow.  RECOMMENDATIONS:   Aspirin and  Brilinta for at least 12 months.  Would recommend de-escalation of aspirin to Brilinta low-dose or Plavix at the end of 12 months.  Consideration of MitraClip or surgical alternative therapy for the mitral valve if  significant regurgitation remains after revascularization in several months.  Aggressive risk factor modification.  We will leave to the primary team the question of systemic anticoagulation given an episode of atrial fibrillation which occurred last night.  Intervention     Patient Profile     Yadriel Kerrigan is a 64 y.o. male with history of epilepsy/seizure disorder and distant history of CAD with stents in the RCA who presents with sudden onset chest pain at 3 AM on 03/27/2019.  EKG upon arrival to ER at roughly 050 indicated inferior STEMI.  Code STEMI was called at 7:23 AM.  Assessment & Plan    1. Day 3 s/p Inferior STEMI: Patient history of prior RCA stenting.  Presented after being awakened from sleep at approximately 3 or 4 AM with severe chest pain and diaphoresis.  Angiographic studies personally reviewed.  Patient underwent successful stenting x2 of his distal RCA extending into the ostium of the PDA.  On DAPT with aspirin/Brilinta.  He was not started on beta-blocker therapy initially due to low blood pressure initially with subsequent initiation of low-dose carvedilol.  2.  Concomitant CAD: Underwent complex two-vessel PCI to the LAD and proximal and mid left circumflex vessels yesterday.  Right heart cath done prior due to the patient's low blood pressure and concern for potential borderline cardiogenic shock.  Mixed venous O2 saturation 63%.  V wave increased to 35 mmHg.  Successful stenting.  3. PAF: went into AF 3/10 in afternoon.  Patient has been started on heparin with his AF.  However this most likely occurred in the setting of post infarction as well as potential concomitant ischemia involving the LAD and circumflex due to significant stenoses.  At present  continue with aspirin/Brilinta and we will not initiate triple drug therapy with anticoagulation.  However patient developed recurrent AF will need to institute anticoagulation and transition Brilinta to Plavix.  4. Ischemic cardiomyopathy: EF 30 -35%; GR I DD; moderately severe MR; following initial culprit artery infarct intervention, done also in the setting of concomitant high-grade disease in the LAD and circumflex.  We will need to redo echo Doppler study following successful revascularization of the LAD and circumflex.  Suspect the moderately severe MR may also have been contributed by papillary muscle dysfunction in the setting of multivessel ischemia.  Consider repeat echo over the next 2 to 4 weeks to allow time for previous "stunned myocardium" to improve and following concomitant CAD intervention.  BP today improved at 244 systolic.  Will initiate very low-dose losartan 12.5 mg.  Recommend transition to [facility] as outpatient.  Resting pulse is still approximately 100.  Since blood pressure is stable will titrate carvedilol to 6.25 mg twice daily today  5.  Transaminase elevation with AST/ ALT:  238/95.  May be secondary to MI.   Atorvastatin reduced to 40 mg.  AST/ALT continues to improve.  We will recheck LFTs in a.m.  Ultimate plan is to reinstitute atorvastatin 80 mg if LFTs normalize with ideal LDL in the 50s or below if  possible.  6.  Hyperlipidemia: Target  LDL less than 70; on atorvastatin.  7.  Post procedure anemia: Hb 12.7->10.6/ HCT 39.8 - >32.7.  Follow-up laboratory today hemoglobin 10.6 and hematocrit 32.5.  8.  History of seizures on Lamictal and Keppra  9.  Consider evaluation for obstructive sleep apnea since patient was awakened from sleep in the early morning most likely during REM sleep raising concern for significant REM sleep-related oxygen desaturation potentially in the setting  of sleep apnea.  We will transfer out of CVICU today up to cardiac telemetry.  Cardiac  rehab.  Possible DC tomorrow if remains stable.  Signed, Troy Sine, MD, [facility] 03/30/2019, 8:08 AM

## 2019-03-31 ENCOUNTER — Other Ambulatory Visit: Payer: Self-pay | Admitting: Physician Assistant

## 2019-03-31 DIAGNOSIS — I340 Nonrheumatic mitral (valve) insufficiency: Secondary | ICD-10-CM

## 2019-03-31 DIAGNOSIS — Z79899 Other long term (current) drug therapy: Secondary | ICD-10-CM

## 2019-03-31 DIAGNOSIS — I2510 Atherosclerotic heart disease of native coronary artery without angina pectoris: Secondary | ICD-10-CM

## 2019-03-31 DIAGNOSIS — R7401 Elevation of levels of liver transaminase levels: Secondary | ICD-10-CM

## 2019-03-31 HISTORY — DX: Nonrheumatic mitral (valve) insufficiency: I34.0

## 2019-03-31 LAB — COMPREHENSIVE METABOLIC PANEL
ALT: 91 U/L — ABNORMAL HIGH (ref 0–44)
AST: 129 U/L — ABNORMAL HIGH (ref 15–41)
Albumin: 2.3 g/dL — ABNORMAL LOW (ref 3.5–5.0)
Alkaline Phosphatase: 66 U/L (ref 38–126)
Anion gap: 13 (ref 5–15)
BUN: 13 mg/dL (ref 8–23)
CO2: 18 mmol/L — ABNORMAL LOW (ref 22–32)
Calcium: 8.3 mg/dL — ABNORMAL LOW (ref 8.9–10.3)
Chloride: 103 mmol/L (ref 98–111)
Creatinine, Ser: 1.08 mg/dL (ref 0.61–1.24)
GFR calc Af Amer: >60 mL/min (ref >60)
GFR calc non Af Amer: >60 mL/min (ref >60)
Glucose, Bld: 95 mg/dL (ref 70–99)
Potassium: 4.5 mmol/L (ref 3.5–5.1)
Sodium: 134 mmol/L — ABNORMAL LOW (ref 135–145)
Total Bilirubin: 0.6 mg/dL (ref 0.3–1.2)
Total Protein: 4.9 g/dL — ABNORMAL LOW (ref 6.5–8.1)

## 2019-03-31 LAB — CBC
HCT: 32 % — ABNORMAL LOW (ref 39.0–52.0)
Hemoglobin: 10.4 g/dL — ABNORMAL LOW (ref 13.0–17.0)
MCH: 27.7 pg (ref 26.0–34.0)
MCHC: 32.5 g/dL (ref 30.0–36.0)
MCV: 85.3 fL (ref 80.0–100.0)
Platelets: 250 10*3/uL (ref 150–400)
RBC: 3.75 MIL/uL — ABNORMAL LOW (ref 4.22–5.81)
RDW: 14.1 % (ref 11.5–15.5)
WBC: 8.7 10*3/uL (ref 4.0–10.5)
nRBC: 0 % (ref 0.0–0.2)

## 2019-03-31 MED ORDER — LOSARTAN POTASSIUM 25 MG PO TABS: 12.5000 mg | ORAL_TABLET | Freq: Every day | ORAL | 2 refills | Status: DC

## 2019-03-31 MED ORDER — ATORVASTATIN CALCIUM 40 MG PO TABS: 40.0000 mg | ORAL_TABLET | Freq: Every day | ORAL | 3 refills | Status: DC

## 2019-03-31 MED ORDER — CARVEDILOL 6.25 MG PO TABS: 6.2500 mg | ORAL_TABLET | Freq: Two times a day (BID) | ORAL | 11 refills | Status: DC

## 2019-03-31 MED ORDER — TICAGRELOR 90 MG PO TABS: 90.0000 mg | ORAL_TABLET | Freq: Two times a day (BID) | ORAL | 3 refills | Status: DC

## 2019-03-31 MED ORDER — TICAGRELOR 90 MG PO TABS: 90.0000 mg | ORAL_TABLET | Freq: Two times a day (BID) | ORAL | 0 refills | Status: DC

## 2019-03-31 NOTE — Progress Notes (Signed)
Patient resting comfortably during shift report. Denies complaints.  

## 2019-03-31 NOTE — Progress Notes (Signed)
IV access removed, CCMD notified of pt discharge, wife at bedside for discharge eduation. Pt escorted off 3E via wheelchair.

## 2019-03-31 NOTE — Discharge Summary (Addendum)
Discharge Summary    Patient ID: Tony Saunders MRN: [id number]; DOB: [birth year]  Admit date: 03/27/2019 Discharge date: 03/31/2019  Primary Care Provider: Kristie Cowman, MD  Primary Cardiologist: Glenetta Hew, MD  Primary Electrophysiologist:  None   Discharge Diagnoses    Principal Problem:   Acute ST elevation myocardial infarction (STEMI) of inferior wall [facility]) Active Problems:   Coronary artery disease involving native coronary artery of native heart with unstable angina pectoris [facility])   Seizure disorder ([place])   Hyperlipidemia with target LDL less than 70   Acute myocardial infarction ([place])   Moderate to severe mitral regurgitation   Elevated transaminase level    Diagnostic Studies/Procedures    Cath 03/27/2019  Prox RCA to Mid RCA stented segment is 15% stenosed.  CULPRIT LESION: Mid RCA to Dist RCA lesion is 100% stenosed with 80% stenosed side branch in RPDA. RPDA lesion is 60% stenosed.  A drug-eluting stent was successfully placed crossing the lesion and across the RPA V into the proximal PDA using a SYNERGY XD 3.0X38. This is postdilated from 3.6 to 3.2 mm.  A second drug-eluting stent was successfully placed overlapping the new stent and the old stent, using a SYNERGY XD 3.50X20. The entire segment was then postdilated to 3.7 mm.  Post intervention, there is a 0% residual stenosis in the entire stented segment.  -  RPAV lesion is 80% stenosed with 80% stenosed side branch in 2nd RPL.  Balloon angioplasty was performed from the ostium of the RPA V into the RPL 2, using a BALLOON SAPPHIRE 2.0X20.  Post intervention, there is a 0% residual stenosis from ostial PAV up to RPL 2.  Post intervention, the RPL 2 side branch was reduced to 10% residual stenosis.  ===============  Prox LAD to Mid LAD lesion is 85% stenosed. Mid LAD lesion is 65% stenosed with 0% stenosed side branch in 1st Sept.  Prox Cx lesion is 80% stenosed. Prox Cx to Mid Cx lesion is 70%  stenosed.  ---- Hemodynamics------  There is severe left ventricular systolic dysfunction. The left ventricular ejection fraction is 25-35% by visual estimate.  LV end diastolic pressure is moderately elevated.  There is mild (2+) mitral regurgitation.  RPDA lesion is 60% stenosed.   SUMMARY  CULPRIT LESION: 100% thrombotic distal RCA distal to previous stents with 40 to 60% stenosis leading up to it from stents. There is post PTCA noted 80% lesions in the RP AV. ? Successful DES PCI of distal RCA into PDA with stent overlapping back to previous stents: Synergy DES 3.0 mm x 38 mm distal, overlapped proximally with a Synergy DES 3.5 mm x 20 mm (postdilated in taper fashion from 3.6 to 3.2 mm)  Otherwise multivessel disease with 80% tapered to 60% proximal to mid LAD at takeoff of major diagonal branch and septal perforator.  80% proximal and 70% mid circumflex involving essentially trifurcation of OM 1, OM 2 and AV groove circumflex.  Severely reduced LVEF of roughly 30 to 35% with global hypokinesis-anterior as well as inferior (will need echo).  Normal EDP. Borderline hypotension, but stable.   RECOMMENDATIONS  Admit to inpatient-CCU. Run Aggrastat for roughly 2 hours until current bottle completed  Restart IV heparin 8 hours after sheath removal.  Will need 2D echocardiogram checked  Likely will require diuresis based on reduced EF  Will need to consider staged PCI of LAD and Circumflex-tentatively scheduled for Thursday with Dr. Tamala Julian  Current blood pressure too low to initiate GDMT with beta-blocker, ACE/ARB  or spironolactone.  DAPT for minimum 1 year  High-dose high intensity statin-start beta-blocker when able  Restart antiseizure medications    Echo 03/28/2019 1. Left ventricular ejection fraction, by estimation, is 30 to 35%. The  left ventricle has moderately decreased function. The left ventricle  demonstrates regional wall motion abnormalities  (see scoring  diagram/findings for description). There is  moderate left ventricular hypertrophy. Left ventricular diastolic  parameters are consistent with Grade I diastolic dysfunction (impaired  relaxation). Elevated left ventricular end-diastolic pressure. There is  severe hypokinesis of the left ventricular,  entire inferior wall, inferoseptal wall, apical segment and anteroapical.  2. Right ventricular systolic function is normal. The right ventricular  size is normal.  3. There is likely ischemic tethering of the posterior leaflet leading to  the eccentric (posteriorly directed) MR jet. The mitral valve is abnormal.  Moderate to severe mitral valve regurgitation.  4. The aortic valve is tricuspid. Aortic valve regurgitation is not  visualized.  5. The inferior vena cava is dilated in size with <50% respiratory  variability, suggesting right atrial pressure of 15 mmHg.    Cath 03/29/2019  A stent was successfully placed.  A stent was successfully placed.  A stent was successfully placed.  A stent was successfully placed.    Hypotension with elevated filling pressures.  Mildly elevated pulmonary artery pressures.  Mixed venous O2 saturation 66%.  Cardiac output 5.8 L/min.  Mean capillary wedge pressure 20 mmHg with V wave to 30 mmHg.  LVEDP 23 mmHg.  Successful LAD stent using a 12 mm 2.5 Synergy postdilated to 3.25 mm proximally and reducing the 85% stenosis to 0% with TIMI grade III flow.  Successful stenting of the circumflex using 2 nonoverlapping stents.  2.5 x 22 mm Onyx placed mid to distal extending into a large third obtuse marginal branch resulting in reduction of 70% stenosis to 0% with TIMI grade III flow.  The proximal lesion was stented using a second 15 x 2.75 mm Onyx deployed at 14 atm and not requiring postdilatation, reducing an 85% stenosis to 0% with TIMI grade III flow.  RECOMMENDATIONS:   Aspirin and Brilinta for at least 12 months.  Would  recommend de-escalation of aspirin to Brilinta low-dose or Plavix at the end of 12 months.  Consideration of MitraClip or surgical alternative therapy for the mitral valve if significant regurgitation remains after revascularization in several months.  Aggressive risk factor modification.  We will leave to the primary team the question of systemic anticoagulation given an episode of atrial fibrillation which occurred last night.  _____________   History of Present Illness     Tony Saunders is a 64 y.o. male with history of epilepsy/seizure disorder and distant history of CAD with stents in the RCA who presents with sudden onset chest pain at 3 AM.  EKG upon arrival to ER at roughly 050 indicated inferior STEMI.  Code STEMI was called at 7:23 AM.   Mr. Hubbert has been feeling somewhat poor for the last week or so with fatigue coughing some congestion.  He has been evaluated with both flu and Covid tests just yesterday.  He has had mild chills but no real fevers.  Has had nausea, but otherwise no real dyspnea, PND orthopnea. This morning at roughly 3 AM he awoke with substernal chest pressure and tightness that was 8 out of 10 and then eased off a little bit.  It was not to the same extent that it had back in 2006, however  when it did not go away, he chose to come to the ER at roughly 6:40 AM.  Upon arrival to ER he was still noted to be in 6-8/10 chest pain, was pale and diaphoretic.  Code STEMI was called.  He was seen in the ER after Covid swabbing and labs checked.  Was brought directly to cardiac catheterization lab once the room was available (patient is already on the table had to be removed.)  He has noted his heart rate is fast, but denies any rapid heartbeat/palpitations.  No syncope or near syncope.  No TIA or amaurosis fugax.  Hospital Course     Consultants: N/A   Patient was taken to the Cath Lab emergently for inferior STEMI.  Cardiac catheterization showed 100% thrombotic  distal RCA lesion distal to the previous stent, there is also a 80% RPDA lesion, these lesions were treated with Synergy 3.0 x 38 mm DES overlapped proximally with a Synergy 3.5 x 20 mm DES.  Patient also has a 80% RPDA V lesion treated with balloon angioplasty, proximal LAD had 85% lesion, 65% mid LAD lesion, 80% proximal left circumflex lesion, severe LV dysfunction with EF 25 to 35% with mild 2+ MR.  Postprocedure, high-sensitivity troponin trended up to greater than 27,000.  Echocardiogram obtained on 02/28/2019 showed EF 30 to 35%, moderate LVH, grade 1 DD, severe hypokinesis of the left ventricular inferior wall, inferoseptal wall, apical segment and anteroapical wall, there was what appears to be ischemic tethering of the posterior leaflet of mitral valve with moderate to severe MR.  Patient returned to the Cath Lab on 03/29/2019 for staged intervention by Dr. Tamala Julian.  He underwent 1 drug-eluting stent to LAD, 2 non-overlapping stent to left circumflex and 1 more drug-eluting stent to OM 3.  During the hospitalization, patient went into new onset of atrial fibrillation in the afternoon of 03/28/2019.  He quickly self converted to sinus rhythm.  Since atrial fibrillation was felt to be related to ischemic heart disease, he has not been placed on systemic anticoagulation therapy.  He also had elevated transaminases which was felt to be statin could relate to the MI.  Lipitor was reduced to 40 mg daily.  In the future, once liver function normalized, may consider increase Lipitor back up to 80 mg daily.  As for his moderate to severe MR which likely related to ischemia, the plan is to repeat echocardiogram in 4 to 6 weeks, if still severe, may need to consider mitraclip or sugery.  Patient was seen in the morning of 03/31/2019 at which time he was doing well without any exertional chest pain or shortness of breath.  He is deemed stable for discharge from cardiology perspective.  Emphasis has been placed on compliance  with dual antiplatelet therapy.  He will need a CBC and CMP in 3 to 5 days.  TOC visit will be arranged in 1 to 2 weeks.  Repeat echocardiogram in 4 to 6 weeks.  During the hospitalization, patient also was awakened from sleep in the early morning most likely during REM sleep, this raises concern for significant REM sleep related oxygen desaturation potentially in the setting of sleep apnea.  Additional testing will be considered as outpatient. During this admission, coreg and losartan were added given LV dysfunction, HF regimen should continue to be uptitrated if possible as outpatient.  Did the patient have an acute coronary syndrome (MI, NSTEMI, STEMI, etc) this admission?:  Yes  AHA/ACC Clinical Performance & Quality Measures: 2. Aspirin prescribed? - Yes 3. ADP Receptor Inhibitor (Plavix/Clopidogrel, Brilinta/Ticagrelor or Effient/Prasugrel) prescribed (includes medically managed patients)? - Yes 4. Beta Blocker prescribed? - Yes 5. High Intensity Statin (Lipitor 40-1m or Crestor 20-416m prescribed? - Yes 6. EF assessed during THIS hospitalization? - Yes 7. For EF <40%, was ACEI/ARB prescribed? - Yes 8. For EF <40%, Aldosterone Antagonist (Spironolactone or Eplerenone) prescribed? - No - Reason:  BP did not tolerate 9. Cardiac Rehab Phase II ordered (Included Medically managed Patients)? - Yes   _____________  Discharge Vitals Blood pressure 110/75, pulse 100, temperature 98.5 F (36.9 C), temperature source Oral, resp. rate 20, height 6' (1.829 m), weight 87.6 kg, SpO2 98 %.  Filed Weights   03/29/19 0600 03/30/19 0600 03/31/19 0432  Weight: 88.7 kg 88.7 kg 87.6 kg    Labs & Radiologic Studies    CBC Recent Labs    03/30/19 0305 03/31/19 0305  WBC 8.9 8.7  HGB 10.6* 10.4*  HCT 32.5* 32.0*  MCV 86.0 85.3  PLT 281 25408 Basic Metabolic Panel Recent Labs    03/30/19 0305 03/31/19 0305  NA 135 134*  K 4.3 4.5  CL 103 103  CO2 21* 18*    GLUCOSE 89 95  BUN 11 13  CREATININE 1.11 1.08  CALCIUM 8.4* 8.3*   Liver Function Tests Recent Labs    03/29/19 0251 03/31/19 0305  AST 118* 129*  ALT 80* 91*  ALKPHOS 66 66  BILITOT 0.5 0.6  PROT 4.9* 4.9*  ALBUMIN 2.3* 2.3*   No results for input(s): LIPASE, AMYLASE in the last 72 hours. High Sensitivity Troponin:   Recent Labs  Lab 03/27/19 0706 03/27/19 1008 03/27/19 1315 03/27/19 1448  TROPONINIHS 1,560* >27,000* >27,000* >27,000*    BNP Invalid input(s): POCBNP D-Dimer No results for input(s): DDIMER in the last 72 hours. Hemoglobin A1C No results for input(s): HGBA1C in the last 72 hours. Fasting Lipid Panel No results for input(s): CHOL, HDL, LDLCALC, TRIG, CHOLHDL, LDLDIRECT in the last 72 hours. Thyroid Function Tests No results for input(s): TSH, T4TOTAL, T3FREE, THYROIDAB in the last 72 hours.  Invalid input(s): FREET3 _____________  CARDIAC CATHETERIZATION  Result Date: 03/29/2019  A stent was successfully placed.  A stent was successfully placed.  A stent was successfully placed.  A stent was successfully placed.   Hypotension with elevated filling pressures.  Mildly elevated pulmonary artery pressures.  Mixed venous O2 saturation 66%.  Cardiac output 5.8 L/min.  Mean capillary wedge pressure 20 mmHg with V wave to 30 mmHg.  LVEDP 23 mmHg.  Successful LAD stent using a 12 mm 2.5 Synergy postdilated to 3.25 mm proximally and reducing the 85% stenosis to 0% with TIMI grade III flow.  Successful stenting of the circumflex using 2 nonoverlapping stents.  2.5 x 22 mm Onyx placed mid to distal extending into a large third obtuse marginal branch resulting in reduction of 70% stenosis to 0% with TIMI grade III flow.  The proximal lesion was stented using a second 15 x 2.75 mm Onyx deployed at 14 atm and not requiring postdilatation, reducing an 85% stenosis to 0% with TIMI grade III flow. RECOMMENDATIONS:  Aspirin and Brilinta for at least 12 months.   Would recommend de-escalation of aspirin to Brilinta low-dose or Plavix at the end of 12 months.  Consideration of MitraClip or surgical alternative therapy for the mitral valve if significant regurgitation remains after revascularization in several months.  Aggressive risk factor  modification.  We will leave to the primary team the question of systemic anticoagulation given an episode of atrial fibrillation which occurred last night.   CARDIAC CATHETERIZATION  Result Date: 03/27/2019  Prox RCA to Mid RCA stented segment is 15% stenosed.  CULPRIT LESION: Mid RCA to Dist RCA lesion is 100% stenosed with 80% stenosed side branch in RPDA. RPDA lesion is 60% stenosed.  A drug-eluting stent was successfully placed crossing the lesion and across the RPA V into the proximal PDA using a SYNERGY XD 3.0X38. This is postdilated from 3.6 to 3.2 mm.  A second drug-eluting stent was successfully placed overlapping the new stent and the old stent, using a SYNERGY XD 3.50X20. The entire segment was then postdilated to 3.7 mm.  Post intervention, there is a 0% residual stenosis in the entire stented segment.  -  RPAV lesion is 80% stenosed with 80% stenosed side branch in 2nd RPL.  Balloon angioplasty was performed from the ostium of the RPA V into the RPL 2, using a BALLOON SAPPHIRE 2.0X20.  Post intervention, there is a 0% residual stenosis from ostial PAV up to RPL 2.  Post intervention, the RPL 2 side branch was reduced to 10% residual stenosis.  ===============  Prox LAD to Mid LAD lesion is 85% stenosed. Mid LAD lesion is 65% stenosed with 0% stenosed side branch in 1st Sept.  Prox Cx lesion is 80% stenosed. Prox Cx to Mid Cx lesion is 70% stenosed.  ---- Hemodynamics------  There is severe left ventricular systolic dysfunction. The left ventricular ejection fraction is 25-35% by visual estimate.  LV end diastolic pressure is moderately elevated.  There is mild (2+) mitral regurgitation.  RPDA lesion is  60% stenosed.  SUMMARY  CULPRIT LESION: 100% thrombotic distal RCA distal to previous stents with 40 to 60% stenosis leading up to it from stents.  There is post PTCA noted 80% lesions in the RP AV. ? Successful DES PCI of distal RCA into PDA with stent overlapping back to previous stents: Synergy DES 3.0 mm x 38 mm distal, overlapped proximally with a Synergy DES 3.5 mm x 20 mm (postdilated in taper fashion from 3.6 to 3.2 mm)  Otherwise multivessel disease with 80% tapered to 60% proximal to mid LAD at takeoff of major diagonal branch and septal perforator.  80% proximal and 70% mid circumflex involving essentially trifurcation of OM 1, OM 2 and AV groove circumflex.  Severely reduced LVEF of roughly 30 to 35% with global hypokinesis-anterior as well as inferior (will need echo).  Normal EDP.  Borderline hypotension, but stable. RECOMMENDATIONS  Admit to inpatient  -CCU.  Run Aggrastat for roughly 2 hours until current bottle completed  Restart IV heparin 8 hours after sheath removal.  Will need 2D echocardiogram checked  Likely will require diuresis based on reduced EF  Will need to consider staged PCI of LAD and Circumflex-tentatively scheduled for Thursday with Dr. Tamala Julian  Current blood pressure too low to initiate GDMT with beta-blocker, ACE/ARB or spironolactone.  DAPT for minimum 1 year  High-dose high intensity statin-start beta-blocker when able  Restart antiseizure medications Glenetta Hew, M.D., M.S. Interventional Cardiologist [address] [place], [place] [address]  DG Chest Port 1 View  Result Date: 03/27/2019 CLINICAL DATA:  Chest pain EXAM: PORTABLE CHEST 1 VIEW COMPARISON:  05/18/2004 FINDINGS: Artifact from EKG leads. Interstitial coarsening, question emphysema. Normal heart size and mediastinal contours. There is no edema, consolidation, effusion, or pneumothorax. IMPRESSION: No evidence of acute disease.  Electronically Signed   By: Monte Fantasia M.D.   On:  03/27/2019 07:50   ECHOCARDIOGRAM COMPLETE  Result Date: 03/28/2019    ECHOCARDIOGRAM REPORT   Patient Name:   AMIRR ACHORD Date of Exam: 03/28/2019 Medical Rec #:  [id number]     Height:       72.0 in Accession #:    [id number]    Weight:       190.0 lb Date of Birth:  [birth year]     BSA:          2.085 m Patient Age:    32 years      BP:           112/75 mmHg Patient Gender: M             HR:           104 bpm. Exam Location:  Inpatient Procedure: 2D Echo, Cardiac Doppler and Color Doppler Indications:    R07.89 Other chest pain  History:        Patient has no prior history of Echocardiogram examinations. CAD                 and Acute MI; Risk Factors:Dyslipidemia.  Sonographer:    Roseanna Rainbow RDCS Referring Phys: [phone] [facility] B [facility]ERT[facility]  Sonographer Comments: Technically difficult study due to poor echo windows. IMPRESSIONS  1. Left ventricular ejection fraction, by estimation, is 30 to 35%. The left ventricle has moderately decreased function. The left ventricle demonstrates regional wall motion abnormalities (see scoring diagram/findings for description). There is moderate left ventricular hypertrophy. Left ventricular diastolic parameters are consistent with Grade I diastolic dysfunction (impaired relaxation). Elevated left ventricular end-diastolic pressure. There is severe hypokinesis of the left ventricular, entire inferior wall, inferoseptal wall, apical segment and anteroapical.  2. Right ventricular systolic function is normal. The right ventricular size is normal.  3. There is likely ischemic tethering of the posterior leaflet leading to the eccentric (posteriorly directed) MR jet. The mitral valve is abnormal. Moderate to severe mitral valve regurgitation.  4. The aortic valve is tricuspid. Aortic valve regurgitation is not visualized.  5. The inferior vena cava is dilated in size with <50% respiratory variability, suggesting right atrial pressure of 15 mmHg. FINDINGS  Left Ventricle: Left ventricular  ejection fraction, by estimation, is 30 to 35%. The left ventricle has moderately decreased function. The left ventricle demonstrates regional wall motion abnormalities. Severe hypokinesis of the left ventricular, entire  inferior wall, inferoseptal wall, apical segment and anteroapical. The left ventricular internal cavity size was small. There is moderate left ventricular hypertrophy. Left ventricular diastolic parameters are consistent with Grade I diastolic dysfunction (impaired relaxation). Elevated left ventricular end-diastolic pressure. Right Ventricle: The right ventricular size is normal. No increase in right ventricular wall thickness. Right ventricular systolic function is normal. Left Atrium: Left atrial size was normal in size. Right Atrium: Right atrial size was normal in size. Pericardium: There is no evidence of pericardial effusion. Mitral Valve: There is likely ischemic tethering of the posterior leaflet leading to the eccentric (posteriorly directed) MR jet. The mitral valve is abnormal. There is mild thickening of the mitral valve leaflet(s). Mildly decreased mobility of the mitral valve leaflets. Moderate to severe mitral valve regurgitation, with posteriorly-directed jet. Tricuspid Valve: The tricuspid valve is grossly normal. Tricuspid valve regurgitation is trivial. Aortic Valve: The aortic valve is tricuspid. Aortic valve regurgitation is not visualized. Pulmonic Valve: The pulmonic valve was grossly normal. Pulmonic valve regurgitation is not  visualized. Aorta: The aortic root, ascending aorta, aortic arch and descending aorta are all structurally normal, with no evidence of dilitation or obstruction. Venous: The inferior vena cava is dilated in size with less than 50% respiratory variability, suggesting right atrial pressure of 15 mmHg. IAS/Shunts: No atrial level shunt detected by color flow Doppler.  LEFT VENTRICLE PLAX 2D LVIDd:         5.23 cm      Diastology LVIDs:         4.47 cm       LV e' lateral:   9.46 cm/s LV PW:         1.30 cm      LV E/e' lateral: 12.6 LV IVS:        1.30 cm      LV e' medial:    8.16 cm/s LVOT diam:     2.10 cm      LV E/e' medial:  14.6 LV SV:         52 LV SV Index:   25 LVOT Area:     3.46 cm  LV Volumes (MOD) LV vol d, MOD A2C: 165.0 ml LV vol d, MOD A4C: 157.0 ml LV vol s, MOD A2C: 117.0 ml LV vol s, MOD A4C: 99.8 ml LV SV MOD A2C:     48.0 ml LV SV MOD A4C:     157.0 ml LV SV MOD BP:      55.1 ml RIGHT VENTRICLE            IVC RV S prime:     8.86 cm/s  IVC diam: 3.23 cm TAPSE (M-mode): 2.2 cm LEFT ATRIUM             Index       RIGHT ATRIUM           Index LA diam:        3.80 cm 1.82 cm/m  RA Area:     13.80 cm LA Vol (A2C):   53.0 ml 25.42 ml/m RA Volume:   27.20 ml  13.05 ml/m LA Vol (A4C):   51.0 ml 24.46 ml/m LA Biplane Vol: 56.0 ml 26.86 ml/m  AORTIC VALVE LVOT Vmax:   82.00 cm/s LVOT Vmean:  58.700 cm/s LVOT VTI:    0.149 m  AORTA Ao Root diam: 3.30 cm Ao Asc diam:  3.20 cm MITRAL VALVE MV Area (PHT): 5.66 cm      SHUNTS MV Decel Time: 134 msec      Systemic VTI:  0.15 m MR Peak grad:    86.3 mmHg   Systemic Diam: 2.10 cm MR Mean grad:    47.5 mmHg MR Vmax:         464.50 cm/s MR Vmean:        302.5 cm/s MR PISA:         1.79 cm MR PISA Eff ROA: 13 mm MR PISA Radius:  0.53 cm MV E velocity: 119.00 cm/s Lyman Bishop MD Electronically signed by Lyman Bishop MD Signature Date/Time: 03/28/2019/12:25:35 PM    Final    Disposition   Pt is being discharged home today in good condition.  Follow-up Plans & Appointments    Follow-up Information    [facility] Follow up.   Specialty: Cardiology Why: obtain lab work in 5 days after discharge, no appointment needed. Come to the front desk, tell the reception you are there for labwork. Contact information: [address] [place]  [phone]       Leonie Man, MD Follow up.   Specialty: Cardiology Why: cardiology scheduler will  contact you for 1-2 followup, please call us if you do not hear from our scheduler in 3 business days Contact information: [place] [address] [phone]        [facility] [address] [facility] Follow up.   Specialty: Cardiology Why: scheduler will contact you to arrange 4-6 weeks outpatient echocardiogram to re-evaluate the mitral valve leakage. This will be done in our [address] office Contact information: [address] [place]       Kristie Cowman, MD. Schedule an appointment as soon as possible for a visit.   Specialty: Family Medicine Contact information: [place] [address] [phone]          Discharge Instructions    Amb Referral to Cardiac Rehabilitation   Complete by: As directed    Diagnosis:  Coronary Stents STEMI PTCA     After initial evaluation and assessments completed: Virtual Based Care may be provided alone or in conjunction with Phase 2 Cardiac Rehab based on patient barriers.: Yes   Diet - low sodium heart healthy   Complete by: As directed    Discharge instructions   Complete by: As directed    No driving for 48 hours. No lifting over 5 lbs for 1 week. No sexual activity for 1 week. Keep procedure site clean & dry. If you notice increased pain, swelling, bleeding or pus, call/return!  You may shower, but no soaking baths/hot tubs/pools for 1 week.   Increase activity slowly   Complete by: As directed       Discharge Medications   Allergies as of 03/31/2019   No Known Allergies     Medication List    TAKE these medications   aspirin EC 81 MG tablet Take 81 mg by mouth daily.   atorvastatin 40 MG tablet Commonly known as: LIPITOR Take 1 tablet (40 mg total) by mouth daily at 6 PM.   carvedilol 6.25 MG tablet Commonly known as: COREG Take 1 tablet (6.25 mg total) by mouth 2 (two) times daily with a meal.   lamoTRIgine 100 MG  tablet Commonly known as: LAMICTAL Take 1 tablet (100 mg total) by mouth 2 (two) times daily.   levETIRAcetam 750 MG tablet Commonly known as: KEPPRA Take 1 tablet (750 mg total) by mouth 2 (two) times daily.   losartan 25 MG tablet Commonly known as: COZAAR Take 0.5 tablets (12.5 mg total) by mouth daily. Start taking on: April 01, 2019   sertraline 25 MG tablet Commonly known as: ZOLOFT Take 1 tablet (25 mg total) by mouth daily.   ticagrelor 90 MG Tabs tablet Commonly known as: BRILINTA Take 1 tablet (90 mg total) by mouth 2 (two) times daily.          Outstanding Labs/Studies   CBC and CMP in 5 days Echocardiogram in 4-6 weeks  Duration of Discharge Encounter   Greater than 30 minutes including physician time.  Hilbert Corrigan, [place] 03/31/2019, 11:02 AM

## 2019-03-31 NOTE — Care Management (Signed)
Patient provided with Brilinta cards and verbalized understanding for use.

## 2019-03-31 NOTE — Progress Notes (Signed)
Progress Note  Patient Name: Tony Saunders Date of Encounter: 03/31/2019  Primary Cardiologist: [facility], Dr. Ellyn Hack  Subjective   Feels well ready to go home.  Inpatient Medications    Scheduled Meds:  aspirin  81 mg Oral Daily   atorvastatin  40 mg Oral q1800   carvedilol  6.25 mg Oral BID WC   Chlorhexidine Gluconate Cloth  6 each Topical Daily   heparin  5,000 Units Subcutaneous Q8H   lamoTRIgine  100 mg Oral BID   levETIRAcetam  750 mg Oral BID   losartan  12.5 mg Oral Daily   sertraline  25 mg Oral Daily   sodium chloride flush  3 mL Intravenous Q12H   sodium chloride flush  3 mL Intravenous Q12H   sodium chloride flush  3 mL Intravenous Q12H   ticagrelor  90 mg Oral BID   Continuous Infusions:  sodium chloride 20 mL/hr at 03/27/19 0733   sodium chloride     sodium chloride 75 mL/hr at 03/29/19 0853   sodium chloride     PRN Meds: sodium chloride, sodium chloride, acetaminophen, morphine injection, ondansetron (ZOFRAN) IV, oxyCODONE, sodium chloride flush, sodium chloride flush   Vital Signs    Vitals:   03/30/19 0950 03/30/19 1022 03/30/19 1950 03/31/19 0432  BP: 110/63 103/68 (!) 101/58 (!) 102/50  Pulse: 99 97 (!) 107 96  Resp:  _0 Temp:  98.3 F (36.8 C) 100.3 F (37.9 C) 98.5 F (36.9 C)  TempSrc:  Oral Oral Oral  SpO2:  97% 93% 98%  Weight:    87.6 kg  Height:        Intake/Output Summary (Last 24 hours) at 03/31/2019 0921 Last data filed at 03/31/2019 0715 Gross per 24 hour  Intake 720 ml  Output 300 ml  Net 420 ml    I/O since admission: +1002  Filed Weights   03/29/19 0600 03/30/19 0600 03/31/19 0432  Weight: 88.7 kg 88.7 kg 87.6 kg    Telemetry    Irregular heart rhythm reviewed - Frequent PACs with sinus rhythm - Personally Reviewed  ECG  SR, inferior infarct - personally reviewed  Physical Exam   BP (!) 102/50 (BP Location: Left Arm)    Pulse 96    Temp 98.5 F (36.9 C) (Oral)    Resp 20    Ht 6'  (1.829 m)    Wt 87.6 kg Comment: scale a   SpO2 98%    BMI 26.19 kg/m  Constitutional: No acute distress Eyes: sclera non-icteric, normal conjunctiva and lids ENMT: normal dentition, moist mucous membranes Cardiovascular: regular rhythm, normal rate, no murmurs. S1 and S2 normal. Radial pulses normal bilaterally, right radial cath site x 2 c/d/i. No jugular venous distention.  Respiratory: clear to auscultation bilaterally GI : normal bowel sounds, soft and nontender. No distention.   MSK: extremities warm, well perfused. No edema.  NEURO: grossly nonfocal exam, moves all extremities. PSYCH: alert and oriented x 3, normal mood and affect.   Labs    Chemistry Recent Labs  Lab 03/27/19 2252 03/27/19 2252 03/29/19 0251 03/29/19 1005 03/29/19 1012 03/30/19 0305 03/31/19 0305  NA 138   < > 135   < > 135 135 134*  K 5.0   < > 4.1   < > 4.1 4.3 4.5  CL 106   < > 105  --   --  103 103  CO2 23   < > 21*  --   --  21*  18*  GLUCOSE 104*   < > 100*  --   --  89 95  BUN 12   < > 11  --   --  11 13  CREATININE 0.91   < > 1.00  --   --  1.11 1.08  CALCIUM 8.5*   < > 8.2*  --   --  8.4* 8.3*  PROT 5.6*  --  4.9*  --   --   --  4.9*  ALBUMIN 2.6*  --  2.3*  --   --   --  2.3*  AST 238*  --  118*  --   --   --  129*  ALT 95*  --  80*  --   --   --  91*  ALKPHOS 69  --  66  --   --   --  66  BILITOT 0.6  --  0.5  --   --   --  0.6  GFRNONAA >60   < > >60  --   --  >60 >60  GFRAA >60   < > >60  --   --  >60 >60  ANIONGAP 9   < > 9  --   --  11 13   < > = values in this interval not displayed.     Hematology Recent Labs  Lab 03/29/19 0251 03/29/19 1005 03/29/19 1012 03/30/19 0305 03/31/19 0305  WBC 8.6  --   --  8.9 8.7  RBC 3.67*  --   --  3.78* 3.75*  HGB 10.2*   < > 9.9* 10.6* 10.4*  HCT 31.6*   < > 29.0* 32.5* 32.0*  MCV 86.1  --   --  86.0 85.3  MCH 27.8  --   --  28.0 27.7  MCHC 32.3  --   --  32.6 32.5  RDW 13.9  --   --  14.1 14.1  PLT 255  --   --  281 250   < > =  values in this interval not displayed.          Cardiac EnzymesNo results for input(s): TROPONINI in the last 168 hours. No results for input(s): TROPIPOC in the last 168 hours.   BNPNo results for input(s): BNP, PROBNP in the last 168 hours.   DDimer No results for input(s): DDIMER in the last 168 hours.   Lipid Panel     Component Value Date/Time   CHOL 124 03/27/2019 2252   TRIG 125 03/27/2019 2252   HDL 22 (L) 03/27/2019 2252   CHOLHDL 5.6 03/27/2019 2252   VLDL 25 03/27/2019 2252   LDLCALC 77 03/27/2019 2252     Radiology    CARDIAC CATHETERIZATION  Result Date: 03/29/2019  A stent was successfully placed.  A stent was successfully placed.  A stent was successfully placed.  A stent was successfully placed.   Hypotension with elevated filling pressures.  Mildly elevated pulmonary artery pressures.  Mixed venous O2 saturation 66%.  Cardiac output 5.8 L/min.  Mean capillary wedge pressure 20 mmHg with V wave to 30 mmHg.  LVEDP 23 mmHg.  Successful LAD stent using a 12 mm 2.5 Synergy postdilated to 3.25 mm proximally and reducing the 85% stenosis to 0% with TIMI grade III flow.  Successful stenting of the circumflex using 2 nonoverlapping stents.  2.5 x 22 mm Onyx placed mid to distal extending into a large third obtuse marginal branch resulting in reduction of 70% stenosis to 0%  with TIMI grade III flow.  The proximal lesion was stented using a second 15 x 2.75 mm Onyx deployed at 14 atm and not requiring postdilatation, reducing an 85% stenosis to 0% with TIMI grade III flow. RECOMMENDATIONS:  Aspirin and Brilinta for at least 12 months.  Would recommend de-escalation of aspirin to Brilinta low-dose or Plavix at the end of 12 months.  Consideration of MitraClip or surgical alternative therapy for the mitral valve if significant regurgitation remains after revascularization in several months.  Aggressive risk factor modification.  We will leave to the primary team the  question of systemic anticoagulation given an episode of atrial fibrillation which occurred last night.    Cardiac Studies    Prox RCA to Mid RCA stented segment is 15% stenosed.  CULPRIT LESION: Mid RCA to Dist RCA lesion is 100% stenosed with 80% stenosed side branch in RPDA. RPDA lesion is 60% stenosed.  A drug-eluting stent was successfully placed crossing the lesion and across the RPA V into the proximal PDA using a SYNERGY XD 3.0X38. This is postdilated from 3.6 to 3.2 mm.  A second drug-eluting stent was successfully placed overlapping the new stent and the old stent, using a SYNERGY XD 3.50X20. The entire segment was then postdilated to 3.7 mm.  Post intervention, there is a 0% residual stenosis in the entire stented segment.  -  RPAV lesion is 80% stenosed with 80% stenosed side branch in 2nd RPL.  Balloon angioplasty was performed from the ostium of the RPA V into the RPL 2, using a BALLOON SAPPHIRE 2.0X20.  Post intervention, there is a 0% residual stenosis from ostial PAV up to RPL 2.  Post intervention, the RPL 2 side branch was reduced to 10% residual stenosis.  ===============  Prox LAD to Mid LAD lesion is 85% stenosed. Mid LAD lesion is 65% stenosed with 0% stenosed side branch in 1st Sept.  Prox Cx lesion is 80% stenosed. Prox Cx to Mid Cx lesion is 70% stenosed.  ---- Hemodynamics------  There is severe left ventricular systolic dysfunction. The left ventricular ejection fraction is 25-35% by visual estimate.  LV end diastolic pressure is moderately elevated.  There is mild (2+) mitral regurgitation.  RPDA lesion is 60% stenosed.   SUMMARY  CULPRIT LESION: 100% thrombotic distal RCA distal to previous stents with 40 to 60% stenosis leading up to it from stents. There is post PTCA noted 80% lesions in the RP AV. ? Successful DES PCI of distal RCA into PDA with stent overlapping back to previous stents: Synergy DES 3.0 mm x 38 mm distal, overlapped  proximally with a Synergy DES 3.5 mm x 20 mm (postdilated in taper fashion from 3.6 to 3.2 mm)  Otherwise multivessel disease with 80% tapered to 60% proximal to mid LAD at takeoff of major diagonal branch and septal perforator.  80% proximal and 70% mid circumflex involving essentially trifurcation of OM 1, OM 2 and AV groove circumflex.  Severely reduced LVEF of roughly 30 to 35% with global hypokinesis-anterior as well as inferior (will need echo).  Normal EDP. Borderline hypotension, but stable.   RECOMMENDATIONS  Admit to inpatient-CCU. Run Aggrastat for roughly 2 hours until current bottle completed  Restart IV heparin 8 hours after sheath removal.  Will need 2D echocardiogram checked  Likely will require diuresis based on reduced EF  Will need to consider staged PCI of LAD and Circumflex-tentatively scheduled for Thursday with Dr. Tamala Julian  Current blood pressure too low to initiate GDMT with  beta-blocker, ACE/ARB or spironolactone.  DAPT for minimum 1 year  High-dose high intensity statin-start beta-blocker when able  Restart antiseizure medications   ECHO 03/28/2019 IMPRESSIONS  1. Left ventricular ejection fraction, by estimation, is 30 to 35%. The  left ventricle has moderately decreased function. The left ventricle  demonstrates regional wall motion abnormalities (see scoring  diagram/findings for description). There is  moderate left ventricular hypertrophy. Left ventricular diastolic  parameters are consistent with Grade I diastolic dysfunction (impaired  relaxation). Elevated left ventricular end-diastolic pressure. There is  severe hypokinesis of the left ventricular,  entire inferior wall, inferoseptal wall, apical segment and anteroapical.  2. Right ventricular systolic function is normal. The right ventricular  size is normal.  3. There is likely ischemic tethering of the posterior leaflet leading to  the eccentric (posteriorly directed) MR jet.  The mitral valve is abnormal.  Moderate to severe mitral valve regurgitation.  4. The aortic valve is tricuspid. Aortic valve regurgitation is not  visualized.  5. The inferior vena cava is dilated in size with <50% respiratory  variability, suggesting right atrial pressure of 15 mmHg.     Right heart cath and two-vessel PCI March 29, 2019  A stent was successfully placed.  A stent was successfully placed.  A stent was successfully placed.  A stent was successfully placed.    Hypotension with elevated filling pressures.  Mildly elevated pulmonary artery pressures.  Mixed venous O2 saturation 66%.  Cardiac output 5.8 L/min.  Mean capillary wedge pressure 20 mmHg with V wave to 30 mmHg.  LVEDP 23 mmHg.  Successful LAD stent using a 12 mm 2.5 Synergy postdilated to 3.25 mm proximally and reducing the 85% stenosis to 0% with TIMI grade III flow.  Successful stenting of the circumflex using 2 nonoverlapping stents.  2.5 x 22 mm Onyx placed mid to distal extending into a large third obtuse marginal branch resulting in reduction of 70% stenosis to 0% with TIMI grade III flow.  The proximal lesion was stented using a second 15 x 2.75 mm Onyx deployed at 14 atm and not requiring postdilatation, reducing an 85% stenosis to 0% with TIMI grade III flow.  RECOMMENDATIONS:   Aspirin and Brilinta for at least 12 months.  Would recommend de-escalation of aspirin to Brilinta low-dose or Plavix at the end of 12 months.  Consideration of MitraClip or surgical alternative therapy for the mitral valve if significant regurgitation remains after revascularization in several months.  Aggressive risk factor modification.  We will leave to the primary team the question of systemic anticoagulation given an episode of atrial fibrillation which occurred last night.  Intervention     Patient Profile     Tony Saunders is a 64 y.o. male with history of epilepsy/seizure disorder and distant  history of CAD with stents in the RCA who presents with sudden onset chest pain at 3 AM on 03/27/2019.  EKG upon arrival to ER at roughly 050 indicated inferior STEMI.  Code STEMI was called at 7:23 AM.  Assessment & Plan    1. s/p Inferior STEMI: Patient history of prior RCA stenting.  Presented after being awakened from sleep at approximately 3 or 4 AM with severe chest pain and diaphoresis.  Patient underwent successful stenting x2 of his distal RCA extending into the ostium of the PDA.  On DAPT with aspirin/Brilinta.  -continue carvedilol 6.25 mg BID -continue asa 81 mg daily, brilinta 90 mg BID, atorva 40 mg daily.   2.  Concomitant CAD:  Underwent complex two-vessel PCI to the LAD and proximal and mid left circumflex vessels. Successful stenting.  3. PAF: went into AF 3/10 in afternoon.  Patient has been started on heparin with his AF.  However this most likely occurred in the setting of post infarction as well as potential concomitant ischemia involving the LAD and circumflex due to significant stenoses.  No recurrence of afib on telemetry, telemetry strips documenting irregular rhythm personally reviewed, appears to be sinus rhythm with frequent PACs in bigeminy.   4. Ischemic cardiomyopathy: EF 30 -35%; GR I DD; moderately severe MR; following initial culprit artery infarct intervention, done also in the setting of concomitant high-grade disease in the LAD and circumflex.  - continue low-dose losartan 12.5 mg.  Recommend transition to [facility] as outpatient.  - continue carvedilol to 6.25 mg twice daily. - repeat echo in 4-6 weeks to reassess moderate-severe MR which may have been contributed to by multivessel CAD.  5.  Transaminase elevation with AST/ ALT:  238/95 peak.  May be secondary to MI.   Atorvastatin reduced to 40 mg.  AST/ALT continues to improve, 129 and 91 today respectively. - needs repeat CMP in 3-5 days after hospital discharge, if LFTs normalize consider increasing  atoravastatin to 80 mg daily.   6.  Hyperlipidemia: Target  LDL less than 70; on atorvastatin.  7.  Post procedure anemia: Hb and HCT remain low but stable, 10.4 and 32. Recheck CBC in 3-5 days.   8.  History of seizures on Lamictal and Keppra  9.  Consider evaluation for obstructive sleep apnea since patient was awakened from sleep in the early morning most likely during REM sleep raising concern for significant REM sleep-related oxygen desaturation potentially in the setting of sleep apnea.  Will plan for hospital dismissal today.   Follow up recommendations: CBC, CMP 3-5 days TOC follow up in cardiology 1-2 weeks.  Repeat echo 4-6 weeks.    Medications and plan reviewed in detail with patient, answered all questions to the best of my ability.   Total time of encounter: 60 minutes total time of encounter, including 35 minutes spent in face-to-face patient care. This time includes coordination of care and counseling regarding above mentioned problem list. Remainder of non-face-to-face time involved reviewing chart documents/testing relevant to the patient encounter and documentation in the medical record.  Cherlynn Kaiser, MD [place]  9:34 AM 03/31/19

## 2019-04-05 ENCOUNTER — Telehealth (HOSPITAL_COMMUNITY): Payer: Self-pay

## 2019-04-05 ENCOUNTER — Other Ambulatory Visit: Payer: Self-pay

## 2019-04-05 DIAGNOSIS — Z79899 Other long term (current) drug therapy: Secondary | ICD-10-CM

## 2019-04-05 DIAGNOSIS — I2510 Atherosclerotic heart disease of native coronary artery without angina pectoris: Secondary | ICD-10-CM

## 2019-04-05 LAB — COMPREHENSIVE METABOLIC PANEL
ALT: 102 IU/L — ABNORMAL HIGH (ref 0–44)
AST: 103 IU/L — ABNORMAL HIGH (ref 0–40)
Albumin/Globulin Ratio: 1.4 (ref 1.2–2.2)
Albumin: 3.3 g/dL — ABNORMAL LOW (ref 3.8–4.8)
Alkaline Phosphatase: 102 IU/L (ref 39–117)
BUN/Creatinine Ratio: 9 — ABNORMAL LOW (ref 10–24)
BUN: 11 mg/dL (ref 8–27)
Bilirubin Total: 0.6 mg/dL (ref 0.0–1.2)
CO2: 22 mmol/L (ref 20–29)
Calcium: 8.8 mg/dL (ref 8.6–10.2)
Chloride: 99 mmol/L (ref 96–106)
Creatinine, Ser: 1.16 mg/dL (ref 0.76–1.27)
GFR calc Af Amer: 78 mL/min/{1.73_m2} (ref >59)
GFR calc non Af Amer: 67 mL/min/{1.73_m2} (ref >59)
Globulin, Total: 2.3 g/dL (ref 1.5–4.5)
Glucose: 108 mg/dL — ABNORMAL HIGH (ref 65–99)
Potassium: 4.5 mmol/L (ref 3.5–5.2)
Sodium: 136 mmol/L (ref 134–144)
Total Protein: 5.6 g/dL — ABNORMAL LOW (ref 6.0–8.5)

## 2019-04-05 LAB — CBC
Hematocrit: 35.9 % — ABNORMAL LOW (ref 37.5–51.0)
Hemoglobin: 11.9 g/dL — ABNORMAL LOW (ref 13.0–17.7)
MCH: 27.3 pg (ref 26.6–33.0)
MCHC: 33.1 g/dL (ref 31.5–35.7)
MCV: 82 fL (ref 79–97)
Platelets: 369 10*3/uL (ref 150–450)
RBC: 4.36 x10E6/uL (ref 4.14–5.80)
RDW: 13.7 % (ref 11.6–15.4)
WBC: 9.5 10*3/uL (ref 3.4–10.8)

## 2019-04-05 NOTE — Telephone Encounter (Signed)
Pt insurance is active and benefits verified through [employer]. Co-pay $35.00, DED $6,000.00/$454.06 met, out of pocket $0.00/$0.00 met, co-insurance 0%. No pre-authorization required. Mandi/[employer], 04/05/19 @ 901AM, REF#[id number]  Will contact patient to see if he is interested in the Cardiac Rehab Program. If interested, patient will need to complete follow up appt. Once completed, patient will be contacted for scheduling upon review by the RN Navigator. 

## 2019-04-05 NOTE — Telephone Encounter (Signed)
Called patient to see if he is interested in the Cardiac Rehab Program. Patient expressed interest. Explained scheduling process and went over insurance, patient verbalized understanding. Will contact patient for scheduling once f/u has been completed.  °

## 2019-04-11 ENCOUNTER — Telehealth: Payer: Self-pay | Admitting: Cardiology

## 2019-04-11 DIAGNOSIS — R7989 Other specified abnormal findings of blood chemistry: Secondary | ICD-10-CM

## 2019-04-11 NOTE — Telephone Encounter (Signed)
Patient's wife calling in to get lab work done. She states that he went to his PCP to get it done but they no longer accept his insurance. Per discharge instructions,   "Follow up with [facility] (Cardiology); obtain lab work in 5 days after discharge, no appointment needed. Come to the front desk, tell the reception you are there for labwork."   Please contact patient to make sure lab orders are in, I did not see any in the active request. They are willing to come to the office to get them done this week before his appt with Kerin Ransom on 04/16/19.

## 2019-04-11 NOTE — Telephone Encounter (Signed)
Spoke with patients wife and order placed  Has appointment with new PCP but not until June Will keep follow up with Lurena Joiner K PA Monday as scheduled

## 2019-04-12 LAB — HEPATIC FUNCTION PANEL
ALT: 52 IU/L — ABNORMAL HIGH (ref 0–44)
AST: 42 IU/L — ABNORMAL HIGH (ref 0–40)
Albumin: 3.7 g/dL — ABNORMAL LOW (ref 3.8–4.8)
Alkaline Phosphatase: 103 IU/L (ref 39–117)
Bilirubin Total: 0.6 mg/dL (ref 0.0–1.2)
Bilirubin, Direct: 0.23 mg/dL (ref 0.00–0.40)
Total Protein: 5.7 g/dL — ABNORMAL LOW (ref 6.0–8.5)

## 2019-04-16 ENCOUNTER — Encounter: Payer: Self-pay | Admitting: Cardiology

## 2019-04-16 ENCOUNTER — Telehealth: Payer: Self-pay | Admitting: Cardiology

## 2019-04-16 ENCOUNTER — Other Ambulatory Visit: Payer: Self-pay

## 2019-04-16 ENCOUNTER — Ambulatory Visit (INDEPENDENT_AMBULATORY_CARE_PROVIDER_SITE_OTHER): Payer: PRIVATE HEALTH INSURANCE | Admitting: Cardiology

## 2019-04-16 VITALS — BP 134/78 | HR 97 | Ht 72.0 in | Wt 189.2 lb

## 2019-04-16 DIAGNOSIS — G40909 Epilepsy, unspecified, not intractable, without status epilepticus: Secondary | ICD-10-CM

## 2019-04-16 DIAGNOSIS — G40109 Localization-related (focal) (partial) symptomatic epilepsy and epileptic syndromes with simple partial seizures, not intractable, without status epilepticus: Secondary | ICD-10-CM

## 2019-04-16 DIAGNOSIS — E785 Hyperlipidemia, unspecified: Secondary | ICD-10-CM

## 2019-04-16 DIAGNOSIS — I255 Ischemic cardiomyopathy: Secondary | ICD-10-CM | POA: Insufficient documentation

## 2019-04-16 DIAGNOSIS — I2510 Atherosclerotic heart disease of native coronary artery without angina pectoris: Secondary | ICD-10-CM

## 2019-04-16 DIAGNOSIS — I480 Paroxysmal atrial fibrillation: Secondary | ICD-10-CM | POA: Diagnosis not present

## 2019-04-16 DIAGNOSIS — I340 Nonrheumatic mitral (valve) insufficiency: Secondary | ICD-10-CM

## 2019-04-16 DIAGNOSIS — R7401 Elevation of levels of liver transaminase levels: Secondary | ICD-10-CM

## 2019-04-16 DIAGNOSIS — I2119 ST elevation (STEMI) myocardial infarction involving other coronary artery of inferior wall: Secondary | ICD-10-CM

## 2019-04-16 DIAGNOSIS — Z9861 Coronary angioplasty status: Secondary | ICD-10-CM

## 2019-04-16 HISTORY — DX: Paroxysmal atrial fibrillation: I48.0

## 2019-04-16 MED ORDER — ROSUVASTATIN CALCIUM 5 MG PO TABS: 5.0000 mg | ORAL_TABLET | Freq: Every day | ORAL | 6 refills | Status: AC

## 2019-04-16 MED ORDER — APIXABAN 5 MG PO TABS: 5.0000 mg | ORAL_TABLET | Freq: Two times a day (BID) | ORAL | 6 refills | Status: DC

## 2019-04-16 MED ORDER — PANTOPRAZOLE SODIUM 40 MG PO TBEC: 40.0000 mg | DELAYED_RELEASE_TABLET | Freq: Every day | ORAL | 0 refills | Status: DC

## 2019-04-16 MED ORDER — NITROGLYCERIN 0.4 MG SL SUBL: 0.4000 mg | SUBLINGUAL_TABLET | SUBLINGUAL | 4 refills | Status: DC | PRN

## 2019-04-16 NOTE — Telephone Encounter (Signed)
New Message:    Wife wants to know if she can in with pt for his appt today at 11:15 with Lurena Joiner? Pt have short term memory problems and can not remember.

## 2019-04-16 NOTE — Telephone Encounter (Signed)
Pt c/o medication issue:  1. Name of Medication: apixaban (ELIQUIS) 5 MG TABS tablet  2. How are you currently taking this medication (dosage and times per day)? n/a  3. Are you having a reaction (difficulty breathing--STAT)? no  4. What is your medication issue? Wife states that this medication is too expensive ($500/month) she would like to know what they can do to get a cheaper price or possibly a generic brand.

## 2019-04-16 NOTE — Assessment & Plan Note (Signed)
Remote pRCA PCI-stenting Acute MI 03/27/2019 treated with urgent m-d RCA PCI and stent (x 2) followed by staged PCI DES to CFX (x3) and LAD (x2) on 03/29/2019
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
## 2019-04-16 NOTE — Assessment & Plan Note (Signed)
Repeat echo 4-6 weeks post MI

## 2019-04-16 NOTE — Telephone Encounter (Signed)
Spoke with wife .  She was concerned about medication. She researched on online about medication. And very apprehensive about her husband taking  Medication   RN explained to  Wife the difference with  Medication and taking aspirin.  wife will contact insurance to see what is on formulary- RN informed wife there are saving cards for both Xarelto and Eliquis  . The office will be glad to have her pick the cards or go online to  website and download coupon.   wife will call tomorrow  With more information - what medication needs to be  Prescribed.

## 2019-04-16 NOTE — Assessment & Plan Note (Signed)
?   Secondary to MI- ? Secondary to under reported ETOH use LFTs have improved and his LDL was only 77.  I added Crestor 5 mg daily today.

## 2019-04-16 NOTE — Patient Instructions (Signed)
Medication Instructions:   START Crestor (Rosuvastatin) 5 mg daily.  START Eliquis (Apixaban) 5 mg twice daily.  Nitroglycerin 0.4 mg has been sent to your pharmacy. Take 1 tablet every five minutes as needed for chest pain. Maximum 3 doses.  IN 4 WEEKS:  STOP Aspirin  Continue on Brilinta and Eliquis  *If you need a refill on your cardiac medications before your next appointment, please call your pharmacy*   Lab Work: Your physician recommends that you return for lab work today: CBC, CMET, TSH  If you have labs (blood work) drawn today and your tests are completely normal, you will receive your results only by: Marland Kitchen MyChart Message (if you have MyChart) OR . A paper copy in the mail If you have any lab test that is abnormal or we need to change your treatment, we will call you to review the results.   Testing/Procedures: Your physician has requested that you have an echocardiogram in 4 weeks. Echocardiography is a painless test that uses sound waves to create images of your heart. It provides your doctor with information about the size and shape of your heart and how well your heart's chambers and valves are working. This procedure takes approximately one hour. There are no restrictions for this procedure.   Follow-Up: At [facility], you and your health needs are our priority.  As part of our continuing mission to provide you with exceptional heart care, we have created designated Provider Care Teams.  These Care Teams include your primary Cardiologist (physician) and Advanced Practice Providers (APPs -  Physician Assistants and Nurse Practitioners) who all work together to provide you with the care you need, when you need it.  We recommend signing up for the patient portal called "MyChart".  Sign up information is provided on this After Visit Summary.  MyChart is used to connect with patients for Virtual Visits (Telemedicine).  Patients are able to view lab/test results, encounter  notes, upcoming appointments, etc.  Non-urgent messages can be sent to your provider as well.   To learn more about what you can do with MyChart, go to [email or web address].    Your next appointment:   6 week(s)  The format for your next appointment:   In Person  Provider:   Glenetta Hew, MD   Other Instructions Please call our office if your symptoms worsen. Our office number is [phone].

## 2019-04-16 NOTE — Assessment & Plan Note (Signed)
EF 30-35% at time of his MI- repeat echo in 4-6 weeks

## 2019-04-16 NOTE — Assessment & Plan Note (Signed)
H/O seizure disorder on Keppra and Lamictal

## 2019-04-16 NOTE — Assessment & Plan Note (Signed)
Brief- post MI- recurrent today in the office with symptoms of fatigue

## 2019-04-16 NOTE — Assessment & Plan Note (Signed)
Elevated LFTs at the time of his MI- discharged on Lipitor 40mg - consider increasing to full dose if his LFTs normalize in 6-8 weeks.

## 2019-04-16 NOTE — Telephone Encounter (Signed)
Contacted wife- advised okay to come due to memory issues and concerns due to his epilepsy.  Patient wife verbalized understanding.

## 2019-04-16 NOTE — Assessment & Plan Note (Signed)
Pt presented 03/27/2019 with an acute inferior MI- Cath 3/9 showed thrombotic occlusion of mid-distal RCA with a patent previously placed pRCA stent. He also had concomitant high garde CFX and LAD disease with an EF of 30-35%

## 2019-04-16 NOTE — Telephone Encounter (Signed)
Called pharmacy pt INSUR $489, CASH PRICE IS [phone]. So this means that we cannot do a PA for this. Can we rx something else?

## 2019-04-16 NOTE — Telephone Encounter (Signed)
2 points: Let us see what his insurance cost is for Xarelto (or Pradaxa)-> we could switch to 1 of those.  Otherwise the other option would be warfarin. If he does stay on either Eliquis, Xarelto or Pradaxa, I would like to switch him from Brilinta at the end of the month to Plavix 75 mg daily with first dose being 300 mg.  Have CC'd Pharm D team.   Glenetta Hew, MD

## 2019-04-17 ENCOUNTER — Telehealth: Payer: Self-pay | Admitting: Cardiology

## 2019-04-17 ENCOUNTER — Telehealth (HOSPITAL_COMMUNITY): Payer: Self-pay

## 2019-04-17 LAB — COMPREHENSIVE METABOLIC PANEL
ALT: 34 IU/L (ref 0–44)
AST: 42 IU/L — ABNORMAL HIGH (ref 0–40)
Albumin/Globulin Ratio: 1.5 (ref 1.2–2.2)
Albumin: 3.5 g/dL — ABNORMAL LOW (ref 3.8–4.8)
Alkaline Phosphatase: 95 IU/L (ref 39–117)
BUN/Creatinine Ratio: 14 (ref 10–24)
BUN: 14 mg/dL (ref 8–27)
Bilirubin Total: 0.8 mg/dL (ref 0.0–1.2)
CO2: 20 mmol/L (ref 20–29)
Calcium: 9 mg/dL (ref 8.6–10.2)
Chloride: 95 mmol/L — ABNORMAL LOW (ref 96–106)
Creatinine, Ser: 1.02 mg/dL (ref 0.76–1.27)
GFR calc Af Amer: 91 mL/min/{1.73_m2} (ref >59)
GFR calc non Af Amer: 78 mL/min/{1.73_m2} (ref >59)
Globulin, Total: 2.4 g/dL (ref 1.5–4.5)
Glucose: 87 mg/dL (ref 65–99)
Potassium: 4.8 mmol/L (ref 3.5–5.2)
Sodium: 134 mmol/L (ref 134–144)
Total Protein: 5.9 g/dL — ABNORMAL LOW (ref 6.0–8.5)

## 2019-04-17 LAB — CBC
Hematocrit: 35.5 % — ABNORMAL LOW (ref 37.5–51.0)
Hemoglobin: 11.6 g/dL — ABNORMAL LOW (ref 13.0–17.7)
MCH: 26.8 pg (ref 26.6–33.0)
MCHC: 32.7 g/dL (ref 31.5–35.7)
MCV: 82 fL (ref 79–97)
Platelets: 311 10*3/uL (ref 150–450)
RBC: 4.33 x10E6/uL (ref 4.14–5.80)
RDW: 14.7 % (ref 11.6–15.4)
WBC: 17.1 10*3/uL — ABNORMAL HIGH (ref 3.4–10.8)

## 2019-04-17 LAB — TSH: TSH: 2.88 u[IU]/mL (ref 0.450–4.500)

## 2019-04-17 MED ORDER — RIVAROXABAN 20 MG PO TABS: 20.0000 mg | ORAL_TABLET | Freq: Every day | ORAL | 11 refills | Status: DC

## 2019-04-17 NOTE — Telephone Encounter (Signed)
SPOKE TO WIFE . SHE states she has not had time to check on medication. She states called early that patient is not doing well today - needing assistance to basic things when last week he was able tod this things. She states he is fatigue and lethargic  She states it takes him  A few minutes to respond to a question which is new . "I think his medication is not agreeing with him"  She states she was informed to take patient to ER for evaluation  From the office earlier today . She states she is at work.but she is leaving now.  RN  Agreed with  Earlier instruction. Informed wife to take patient to ER today and not tomorrow as patient requested   Wife said "I will deal will this later." RN agreed .

## 2019-04-17 NOTE — Telephone Encounter (Signed)
Spoke to patient's wife she stated she was returning a call to Trixie Dredge RN.Advised I will send message to her.

## 2019-04-17 NOTE — Telephone Encounter (Signed)
Spoke  With wife. She wanted to let RN know when she went home to check on patient he was able to get around without difficult. Paper wife he was abe to follow commands ,  Smile and talk without problems  - he went to bathroom by himself as well as fixed him something to eat.  wife states she will not be taking him to th ER at present time. RN informed wife if patient develop any symptoms like this in the future please have patient evaluated at  ER. Wife verbalized understanding.   RN asked if the patient has started Eliquis at all since leaving the hospital . She states know since pharmacy did not have medication available.  Wife states that both she and patient would prefer to switch to Xarelto from Eliquis . Wife states she has  researched on the Internet - the company is being sued.   RN informed wife that will discuss and review with L. Kilroy PA about switching medication and change prescription. RN informed wife the patient needs to on this medication or something similar. Asked if she maybe able to pick medication samples from office today .  Wife states she is  Unable for today , but  will be able to pick up samples and saving card tomorrow.   RN discussed with Lurena Joiner.  okay'd per Lurena Joiner to discontinue Eliquis and switch to Xarelto 20 mg one table a day.  instruction and samples left for wife to pick up.  Wife verbalized understanding

## 2019-04-17 NOTE — Telephone Encounter (Signed)
Called pt to see if he was interested in the cardiac rehab program, pt wife stated that pt was placed on new meds and that he is "zombie like" he is spaced out and she is having to help him go to the bathroom and different things and she stated that she will be calling his doctor to see if they can adjust his medications. Advised pt wife to give [place] a call back when he is ready. She understood.

## 2019-04-17 NOTE — Telephone Encounter (Signed)
Pt c/o medication issue:  1. Name of Medication: rosuvastatin (CRESTOR) 5 MG tablet  2. How are you currently taking this medication (dosage and times per day)? As directed  3. Are you having a reaction (difficulty breathing--STAT)? yes  4. What is your medication issue? Patient's wife states that her husband took this new medication for the first time yesterday. She states that today he looks "borderline zombie". She says that he can barely move, he cant get off the couch and he has a glaze in his eyes. Please advise.

## 2019-04-17 NOTE — Telephone Encounter (Signed)
New Messages  I have the patient's wife returning phone call from Trixie Dredge.   Please call back

## 2019-04-17 NOTE — Telephone Encounter (Signed)
Spoke with patient's spouse. She reports patient took crestor last night and today he is "zombie like." Patient is unable to get up off of his couch or walk around. Patient has delayed responses and a funny look on his face. Patient has had some nausea/vomiting and a decreased appetite for the last few days. No vital signs available. Spouse advised to take patient to the nearest emergency room for evaluation. Spouse verbalized understanding.

## 2019-04-25 ENCOUNTER — Telehealth: Payer: Self-pay | Admitting: Cardiology

## 2019-04-25 NOTE — Telephone Encounter (Signed)
New message  Patient's wife wants to know what the patient can take for hiccups. Please advise.

## 2019-04-26 NOTE — Telephone Encounter (Signed)
Pts wife advised and will call his PCP.

## 2019-04-26 NOTE — Telephone Encounter (Signed)
I would defer to his PCP for how to manage hiccups.  Has been a longtime since I have tried to treat those.  Glenetta Hew, MD

## 2019-05-01 ENCOUNTER — Other Ambulatory Visit (HOSPITAL_COMMUNITY): Payer: PRIVATE HEALTH INSURANCE

## 2019-05-12 ENCOUNTER — Other Ambulatory Visit: Payer: Self-pay | Admitting: Cardiology

## 2019-05-14 ENCOUNTER — Ambulatory Visit (HOSPITAL_COMMUNITY): Payer: PRIVATE HEALTH INSURANCE | Attending: Internal Medicine

## 2019-05-14 ENCOUNTER — Telehealth: Payer: Self-pay | Admitting: Cardiology

## 2019-05-14 ENCOUNTER — Other Ambulatory Visit: Payer: Self-pay

## 2019-05-14 DIAGNOSIS — I255 Ischemic cardiomyopathy: Secondary | ICD-10-CM

## 2019-05-14 DIAGNOSIS — I340 Nonrheumatic mitral (valve) insufficiency: Secondary | ICD-10-CM | POA: Diagnosis not present

## 2019-05-14 HISTORY — PX: TRANSTHORACIC ECHOCARDIOGRAM: SHX275

## 2019-05-14 MED ORDER — CARVEDILOL 6.25 MG PO TABS: 3.1250 mg | ORAL_TABLET | Freq: Two times a day (BID) | ORAL | 11 refills | Status: DC

## 2019-05-14 MED ORDER — LOSARTAN POTASSIUM 25 MG PO TABS: 12.5000 mg | ORAL_TABLET | Freq: Every day | ORAL | 2 refills | Status: DC

## 2019-05-14 NOTE — Telephone Encounter (Signed)
Spoke with patients wife regarding blood pressures Today during his Echo patients SBP was 91, DOD said no changes SBP has been running in the 90's since out of the hospital He does get dizzy when he stand, has no energy, and gets confused/lethargic. He does have short term memory loss secondary to his seizure disorder.  Difficulty catching his breath at times.  Meds:  Carvedilol 6.25 mg twice a day Brilinta 90 mg twice a day Losartan 25 mg 1/2 tablet daily  Will forward to Dr Ellyn Hack for review

## 2019-05-14 NOTE — Telephone Encounter (Signed)
I would be in favor of him cutting his carvedilol dose to 1/2 tablet twice daily and for now hold losartan.  I should be seeing him back after his echo when we can reassess.  Glenetta Hew, MD

## 2019-05-14 NOTE — Telephone Encounter (Signed)
Spoke to patient's wife . Instruction given for medication change. Medication list changed as well. Patient had echo done today  F/U appointment 06/07/19  Patient's wife verbalized understanding.

## 2019-05-14 NOTE — Progress Notes (Signed)
Patient ID: Tony Saunders, male   DOB: 1955-02-12, 64 y.o.   MRN: [id number]  Consulted Dr. Johnsie Cancel (DOD), of patient's blood pressure reading 92/66. Per Dr. Johnsie Cancel, instruct patient to follow-up with Kerin Ransom, patient ok to be discharged.

## 2019-05-14 NOTE — Telephone Encounter (Signed)
Pt c/o BP issue: STAT if pt c/o blurred vision, one-sided weakness or slurred speech  1. What are your last 5 BP readings?  Patient's wife states she does not have any readings.  2. Are you having any other symptoms (ex. Dizziness, headache, blurred vision, passed out)? Dizziness 3. What is your BP issue? Patient's wife states the patient's BP has been extremely low and she is calling to inquire about whether or not the patient needs to schedule an appointment.   STAT if patient feels like he/she is going to faint   1) Are you dizzy now? Not sure. Patient's wife states she is not with the patient right now. 2) Do you feel faint or have you passed out? Yes  3) Do you have any other symptoms? Np  4) Have you checked your HR and BP (record if available)? No BP/HR readings available.

## 2019-05-18 ENCOUNTER — Telehealth: Payer: Self-pay | Admitting: Cardiology

## 2019-05-18 NOTE — Telephone Encounter (Signed)
I called the patient to discuss his echo results.  His wife asked to take the information as the patient has had some memory issues. I explained that his echo findings had not changed.  Dr Ellyn Hack has reviewed with Dr Haroldine Laws and I told them to expect a call from the CHF clinic for an appointment with Dr Haroldine Laws for further evaluation.   Kerin Ransom PA-C 05/18/2019 11:04 AM

## 2019-05-20 ENCOUNTER — Telehealth: Payer: Self-pay | Admitting: Student

## 2019-05-20 NOTE — Telephone Encounter (Signed)
    The patient's wife called the after-hours line reporting he has been experiencing worsening swelling along his left leg. I did get her to check for pitting edema and she reports this did not leave an indention. He does have baseline dyspnea on exertion but this has overall been stable. No recent orthopnea or PND. They do not have scales at the house.  I recommended that they obtain scales and continue to follow daily weights. He has been consuming a significant amount of fluids and I recommended limiting this to less than 2 L daily. He is following a low-sodium diet. Given his issues with hypotension and dizziness, I recommended they try to utilize compression stockings initially and to elevate his lower extremities as much as possible. He may require diuretic therapy in the future given his reduced EF.  She voiced understanding of this and plans to purchase scales and compression stockings along with following weights. Will forward to Dr. Ellyn Hack as an Juluis Rainier.   Signed, Erma Heritage, PA-C 05/20/2019, 12:28 PM Pager: [phone]

## 2019-05-20 NOTE — Telephone Encounter (Signed)
We are trying to get this gentleman into the heart failure clinic.  He looks like he is in the be pretty difficult to manage ischemic cardiomyopathy patient.  Will need close follow-up. Would be nice if he can be seen this week.  Glenetta Hew, MD

## 2019-05-22 ENCOUNTER — Inpatient Hospital Stay (HOSPITAL_COMMUNITY)
Admission: EM | Admit: 2019-05-22 | Discharge: 2019-05-25 | DRG: 823 | Disposition: A | Discharge status: Home / Self Care | Payer: No Typology Code available for payment source | Attending: Internal Medicine | Admitting: Internal Medicine

## 2019-05-22 ENCOUNTER — Emergency Department (HOSPITAL_COMMUNITY): Payer: No Typology Code available for payment source

## 2019-05-22 ENCOUNTER — Encounter (HOSPITAL_COMMUNITY): Payer: Self-pay | Admitting: Emergency Medicine

## 2019-05-22 ENCOUNTER — Other Ambulatory Visit: Payer: Self-pay

## 2019-05-22 DIAGNOSIS — I5033 Acute on chronic diastolic (congestive) heart failure: Secondary | ICD-10-CM | POA: Diagnosis not present

## 2019-05-22 DIAGNOSIS — I483 Typical atrial flutter: Secondary | ICD-10-CM

## 2019-05-22 DIAGNOSIS — R7401 Elevation of levels of liver transaminase levels: Secondary | ICD-10-CM | POA: Diagnosis present

## 2019-05-22 DIAGNOSIS — M7989 Other specified soft tissue disorders: Secondary | ICD-10-CM

## 2019-05-22 DIAGNOSIS — R54 Age-related physical debility: Secondary | ICD-10-CM | POA: Diagnosis present

## 2019-05-22 DIAGNOSIS — I4891 Unspecified atrial fibrillation: Secondary | ICD-10-CM | POA: Diagnosis not present

## 2019-05-22 DIAGNOSIS — I255 Ischemic cardiomyopathy: Secondary | ICD-10-CM | POA: Diagnosis present

## 2019-05-22 DIAGNOSIS — I700 Atherosclerosis of aorta: Secondary | ICD-10-CM | POA: Diagnosis present

## 2019-05-22 DIAGNOSIS — I4892 Unspecified atrial flutter: Secondary | ICD-10-CM | POA: Diagnosis not present

## 2019-05-22 DIAGNOSIS — I480 Paroxysmal atrial fibrillation: Secondary | ICD-10-CM | POA: Diagnosis present

## 2019-05-22 DIAGNOSIS — R1900 Intra-abdominal and pelvic swelling, mass and lump, unspecified site: Secondary | ICD-10-CM

## 2019-05-22 DIAGNOSIS — G40909 Epilepsy, unspecified, not intractable, without status epilepticus: Secondary | ICD-10-CM | POA: Diagnosis present

## 2019-05-22 DIAGNOSIS — J189 Pneumonia, unspecified organism: Secondary | ICD-10-CM | POA: Diagnosis present

## 2019-05-22 DIAGNOSIS — C8590 Non-Hodgkin lymphoma, unspecified, unspecified site: Secondary | ICD-10-CM

## 2019-05-22 DIAGNOSIS — Z20822 Contact with and (suspected) exposure to covid-19: Secondary | ICD-10-CM | POA: Diagnosis present

## 2019-05-22 DIAGNOSIS — F1010 Alcohol abuse, uncomplicated: Secondary | ICD-10-CM | POA: Diagnosis present

## 2019-05-22 DIAGNOSIS — M79609 Pain in unspecified limb: Secondary | ICD-10-CM

## 2019-05-22 DIAGNOSIS — R591 Generalized enlarged lymph nodes: Secondary | ICD-10-CM | POA: Diagnosis not present

## 2019-05-22 DIAGNOSIS — I5042 Chronic combined systolic (congestive) and diastolic (congestive) heart failure: Secondary | ICD-10-CM | POA: Diagnosis present

## 2019-05-22 DIAGNOSIS — Z8249 Family history of ischemic heart disease and other diseases of the circulatory system: Secondary | ICD-10-CM

## 2019-05-22 DIAGNOSIS — I509 Heart failure, unspecified: Secondary | ICD-10-CM

## 2019-05-22 DIAGNOSIS — Z87891 Personal history of nicotine dependence: Secondary | ICD-10-CM

## 2019-05-22 DIAGNOSIS — E785 Hyperlipidemia, unspecified: Secondary | ICD-10-CM | POA: Diagnosis present

## 2019-05-22 DIAGNOSIS — D649 Anemia, unspecified: Secondary | ICD-10-CM | POA: Diagnosis present

## 2019-05-22 DIAGNOSIS — I2510 Atherosclerotic heart disease of native coronary artery without angina pectoris: Secondary | ICD-10-CM

## 2019-05-22 DIAGNOSIS — I252 Old myocardial infarction: Secondary | ICD-10-CM | POA: Diagnosis not present

## 2019-05-22 DIAGNOSIS — Z955 Presence of coronary angioplasty implant and graft: Secondary | ICD-10-CM

## 2019-05-22 DIAGNOSIS — R42 Dizziness and giddiness: Secondary | ICD-10-CM | POA: Diagnosis present

## 2019-05-22 DIAGNOSIS — I340 Nonrheumatic mitral (valve) insufficiency: Secondary | ICD-10-CM | POA: Diagnosis present

## 2019-05-22 DIAGNOSIS — Z9181 History of falling: Secondary | ICD-10-CM | POA: Diagnosis not present

## 2019-05-22 DIAGNOSIS — R600 Localized edema: Secondary | ICD-10-CM

## 2019-05-22 DIAGNOSIS — Z7982 Long term (current) use of aspirin: Secondary | ICD-10-CM

## 2019-05-22 DIAGNOSIS — M898X8 Other specified disorders of bone, other site: Secondary | ICD-10-CM | POA: Diagnosis present

## 2019-05-22 DIAGNOSIS — Z7901 Long term (current) use of anticoagulants: Secondary | ICD-10-CM | POA: Diagnosis not present

## 2019-05-22 DIAGNOSIS — M25552 Pain in left hip: Secondary | ICD-10-CM | POA: Diagnosis present

## 2019-05-22 DIAGNOSIS — Z9861 Coronary angioplasty status: Secondary | ICD-10-CM

## 2019-05-22 DIAGNOSIS — Z79899 Other long term (current) drug therapy: Secondary | ICD-10-CM

## 2019-05-22 DIAGNOSIS — C8593 Non-Hodgkin lymphoma, unspecified, intra-abdominal lymph nodes: Secondary | ICD-10-CM | POA: Diagnosis present

## 2019-05-22 DIAGNOSIS — G40109 Localization-related (focal) (partial) symptomatic epilepsy and epileptic syndromes with simple partial seizures, not intractable, without status epilepticus: Secondary | ICD-10-CM

## 2019-05-22 DIAGNOSIS — I823 Embolism and thrombosis of renal vein: Secondary | ICD-10-CM | POA: Diagnosis present

## 2019-05-22 DIAGNOSIS — Z7902 Long term (current) use of antithrombotics/antiplatelets: Secondary | ICD-10-CM

## 2019-05-22 LAB — CBC
HCT: 36.4 % — ABNORMAL LOW (ref 39.0–52.0)
Hemoglobin: 10.9 g/dL — ABNORMAL LOW (ref 13.0–17.0)
MCH: 25.3 pg — ABNORMAL LOW (ref 26.0–34.0)
MCHC: 29.9 g/dL — ABNORMAL LOW (ref 30.0–36.0)
MCV: 84.5 fL (ref 80.0–100.0)
Platelets: 426 10*3/uL — ABNORMAL HIGH (ref 150–400)
RBC: 4.31 MIL/uL (ref 4.22–5.81)
RDW: 17.9 % — ABNORMAL HIGH (ref 11.5–15.5)
WBC: 18.2 10*3/uL — ABNORMAL HIGH (ref 4.0–10.5)
nRBC: 0 % (ref 0.0–0.2)

## 2019-05-22 LAB — BASIC METABOLIC PANEL
Anion gap: 14 (ref 5–15)
BUN: 15 mg/dL (ref 8–23)
CO2: 23 mmol/L (ref 22–32)
Calcium: 8.5 mg/dL — ABNORMAL LOW (ref 8.9–10.3)
Chloride: 98 mmol/L (ref 98–111)
Creatinine, Ser: 1.15 mg/dL (ref 0.61–1.24)
GFR calc Af Amer: >60 mL/min (ref >60)
GFR calc non Af Amer: >60 mL/min (ref >60)
Glucose, Bld: 100 mg/dL — ABNORMAL HIGH (ref 70–99)
Potassium: 4.2 mmol/L (ref 3.5–5.1)
Sodium: 135 mmol/L (ref 135–145)

## 2019-05-22 LAB — RESPIRATORY PANEL BY RT PCR (FLU A&B, COVID)
Influenza A by PCR: NEGATIVE
Influenza B by PCR: NEGATIVE
SARS Coronavirus 2 by RT PCR: NEGATIVE

## 2019-05-22 LAB — TROPONIN I (HIGH SENSITIVITY): Troponin I (High Sensitivity): 13 ng/L (ref <18)

## 2019-05-22 MED ORDER — FUROSEMIDE 10 MG/ML IJ SOLN
40.0000 mg | INTRAMUSCULAR | Status: AC
  Administered 2019-05-22: 40 mg via INTRAVENOUS
  Filled 2019-05-22: qty 4

## 2019-05-22 MED ORDER — SODIUM CHLORIDE 0.9% FLUSH
3.0000 mL | Freq: Once | INTRAVENOUS | Status: AC
  Administered 2019-05-22: 3 mL via INTRAVENOUS

## 2019-05-22 MED ORDER — IOHEXOL 300 MG/ML  SOLN
100.0000 mL | Freq: Once | INTRAMUSCULAR | Status: AC | PRN
  Administered 2019-05-22: 100 mL via INTRAVENOUS

## 2019-05-22 MED ORDER — TICAGRELOR 90 MG PO TABS
90.0000 mg | ORAL_TABLET | Freq: Two times a day (BID) | ORAL | Status: DC
  Administered 2019-05-22 – 2019-05-25 (×4): 90 mg via ORAL
  Filled 2019-05-22 (×5): qty 1

## 2019-05-22 MED ORDER — ETOMIDATE 2 MG/ML IV SOLN
12.0000 mg | Freq: Once | INTRAVENOUS | Status: AC
  Administered 2019-05-22: 12 mg via INTRAVENOUS
  Filled 2019-05-22: qty 10

## 2019-05-22 MED ORDER — RIVAROXABAN 20 MG PO TABS
20.0000 mg | ORAL_TABLET | Freq: Every day | ORAL | Status: DC
  Administered 2019-05-22: 20 mg via ORAL
  Filled 2019-05-22 (×2): qty 1
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
## 2019-05-22 NOTE — Telephone Encounter (Signed)
Spoke with pt's wife who report since speaking with Bernerd Pho, PA on 5/2, pt's swelling has worsened. She report left leg is 3 times it's normal size and swelling is on the left side of abdomen. She report pt's usually have baseline DOE but can tell it's worsened.   Nurse consulted with Dr. Ellyn Hack who recommend pt seek emergent. Wife verbalized understanding.
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
## 2019-05-22 NOTE — ED Notes (Signed)
Admitting MD at bedside.

## 2019-05-22 NOTE — Sedation Documentation (Signed)
Patient denies pain and is resting comfortably.  

## 2019-05-22 NOTE — ED Notes (Signed)
Cards at bedside

## 2019-05-22 NOTE — ED Triage Notes (Signed)
Pt arrives to ED with wife with c/o of left leg swelling starts in thigh and into lower leg. Pt noted to have heart rate in 150's in triage. Pt reports exertional sob

## 2019-05-22 NOTE — Telephone Encounter (Signed)
Patient's wife calling back stating the patient's thigh is now swelling. She states his thigh is 3 times it's normal size. She also states the left side of his stomach is also swollen. She says his ankle and foot are still swollen, but they get better when he elevates his leg. She states he also has an injured hip and she is concerned the fluid is making it difficult to heal. She states he is not SOB and does not have any other symptoms. She states she does not know if he has gained any weight.

## 2019-05-22 NOTE — Sedation Documentation (Signed)
Shock delivered at this time by MD Little.

## 2019-05-22 NOTE — Progress Notes (Signed)
Left lower extremity venous duplex complete.  Preliminary results given to Dr. Rex Kras @ 17:30. Please see CV proc tab for preliminary results. Lita Mains- RDMS, RVT 5:49 PM  05/22/2019

## 2019-05-22 NOTE — ED Notes (Signed)
Vascular at bedside

## 2019-05-22 NOTE — ED Notes (Signed)
Consent completed at bedside for procedural/deep sedation and electrical cardioversion performed by MD Little.

## 2019-05-22 NOTE — Sedation Documentation (Signed)
EKG completed. Pt in sinus tach.

## 2019-05-22 NOTE — ED Notes (Signed)
Pt transported to CT ?

## 2019-05-23 ENCOUNTER — Encounter (HOSPITAL_COMMUNITY): Payer: Self-pay | Admitting: Internal Medicine

## 2019-05-23 ENCOUNTER — Other Ambulatory Visit: Payer: Self-pay

## 2019-05-23 DIAGNOSIS — I4891 Unspecified atrial fibrillation: Secondary | ICD-10-CM | POA: Diagnosis present

## 2019-05-23 DIAGNOSIS — I4892 Unspecified atrial flutter: Secondary | ICD-10-CM

## 2019-05-23 DIAGNOSIS — M7989 Other specified soft tissue disorders: Secondary | ICD-10-CM | POA: Insufficient documentation

## 2019-05-23 DIAGNOSIS — R591 Generalized enlarged lymph nodes: Secondary | ICD-10-CM | POA: Diagnosis not present

## 2019-05-23 LAB — HEPATIC FUNCTION PANEL
ALT: 23 U/L (ref 0–44)
AST: 29 U/L (ref 15–41)
Albumin: 2 g/dL — ABNORMAL LOW (ref 3.5–5.0)
Alkaline Phosphatase: 66 U/L (ref 38–126)
Bilirubin, Direct: 0.3 mg/dL — ABNORMAL HIGH (ref 0.0–0.2)
Indirect Bilirubin: 0.3 mg/dL (ref 0.3–0.9)
Total Bilirubin: 0.6 mg/dL (ref 0.3–1.2)
Total Protein: 4.6 g/dL — ABNORMAL LOW (ref 6.5–8.1)

## 2019-05-23 LAB — CBC
HCT: 32 % — ABNORMAL LOW (ref 39.0–52.0)
Hemoglobin: 9.7 g/dL — ABNORMAL LOW (ref 13.0–17.0)
MCH: 25.4 pg — ABNORMAL LOW (ref 26.0–34.0)
MCHC: 30.3 g/dL (ref 30.0–36.0)
MCV: 83.8 fL (ref 80.0–100.0)
Platelets: 308 10*3/uL (ref 150–400)
RBC: 3.82 MIL/uL — ABNORMAL LOW (ref 4.22–5.81)
RDW: 18 % — ABNORMAL HIGH (ref 11.5–15.5)
WBC: 13.6 10*3/uL — ABNORMAL HIGH (ref 4.0–10.5)
nRBC: 0 % (ref 0.0–0.2)

## 2019-05-23 LAB — BASIC METABOLIC PANEL
Anion gap: 14 (ref 5–15)
BUN: 15 mg/dL (ref 8–23)
CO2: 23 mmol/L (ref 22–32)
Calcium: 8 mg/dL — ABNORMAL LOW (ref 8.9–10.3)
Chloride: 98 mmol/L (ref 98–111)
Creatinine, Ser: 1.2 mg/dL (ref 0.61–1.24)
GFR calc Af Amer: >60 mL/min (ref >60)
GFR calc non Af Amer: >60 mL/min (ref >60)
Glucose, Bld: 89 mg/dL (ref 70–99)
Potassium: 3.7 mmol/L (ref 3.5–5.1)
Sodium: 135 mmol/L (ref 135–145)

## 2019-05-23 LAB — HEPARIN LEVEL (UNFRACTIONATED): Heparin Unfractionated: >2.2 IU/mL — ABNORMAL HIGH (ref 0.30–0.70)

## 2019-05-23 LAB — HIV ANTIBODY (ROUTINE TESTING W REFLEX): HIV Screen 4th Generation wRfx: NONREACTIVE

## 2019-05-23 LAB — APTT: aPTT: 37 seconds — ABNORMAL HIGH (ref 24–36)

## 2019-05-23 MED ORDER — ENSURE ENLIVE PO LIQD
237.0000 mL | Freq: Two times a day (BID) | ORAL | Status: DC
  Administered 2019-05-23 – 2019-05-24 (×3): 237 mL via ORAL

## 2019-05-23 MED ORDER — SODIUM CHLORIDE 0.9 % IV SOLN
2.0000 g | INTRAVENOUS | Status: DC
  Administered 2019-05-23 – 2019-05-25 (×3): 2 g via INTRAVENOUS
  Filled 2019-05-23 (×2): qty 20
  Filled 2019-05-23: qty 2

## 2019-05-23 MED ORDER — SERTRALINE HCL 50 MG PO TABS
25.0000 mg | ORAL_TABLET | Freq: Every day | ORAL | Status: DC
  Administered 2019-05-23 – 2019-05-25 (×3): 25 mg via ORAL
  Filled 2019-05-23 (×3): qty 1

## 2019-05-23 MED ORDER — SODIUM CHLORIDE 0.9 % IV SOLN
500.0000 mg | INTRAVENOUS | Status: DC
  Administered 2019-05-23 – 2019-05-25 (×2): 500 mg via INTRAVENOUS
  Filled 2019-05-23 (×2): qty 500

## 2019-05-23 MED ORDER — CARVEDILOL 3.125 MG PO TABS
3.1250 mg | ORAL_TABLET | Freq: Two times a day (BID) | ORAL | Status: DC
  Administered 2019-05-23 – 2019-05-25 (×5): 3.125 mg via ORAL
  Filled 2019-05-23 (×6): qty 1

## 2019-05-23 MED ORDER — LEVETIRACETAM 750 MG PO TABS
750.0000 mg | ORAL_TABLET | Freq: Two times a day (BID) | ORAL | Status: DC
  Administered 2019-05-23 – 2019-05-25 (×5): 750 mg via ORAL
  Filled 2019-05-23 (×5): qty 1

## 2019-05-23 MED ORDER — HEPARIN (PORCINE) 25000 UT/250ML-% IV SOLN
1600.0000 [IU]/h | INTRAVENOUS | Status: DC
  Administered 2019-05-23: 1600 [IU]/h via INTRAVENOUS
  Filled 2019-05-23: qty 250

## 2019-05-23 MED ORDER — ROSUVASTATIN CALCIUM 5 MG PO TABS
5.0000 mg | ORAL_TABLET | Freq: Every day | ORAL | Status: DC
  Administered 2019-05-23 – 2019-05-24 (×2): 5 mg via ORAL
  Filled 2019-05-23 (×2): qty 1

## 2019-05-23 MED ORDER — LAMOTRIGINE 100 MG PO TABS
100.0000 mg | ORAL_TABLET | Freq: Two times a day (BID) | ORAL | Status: DC
  Administered 2019-05-23 – 2019-05-25 (×5): 100 mg via ORAL
  Filled 2019-05-23 (×6): qty 1

## 2019-05-23 MED ORDER — ONDANSETRON HCL 4 MG PO TABS: 4.0000 mg | ORAL_TABLET | Freq: Four times a day (QID) | ORAL | Status: DC | PRN

## 2019-05-23 MED ORDER — NITROGLYCERIN 0.4 MG SL SUBL: 0.4000 mg | SUBLINGUAL_TABLET | SUBLINGUAL | Status: DC | PRN

## 2019-05-23 MED ORDER — ONDANSETRON HCL 4 MG/2ML IJ SOLN: 4.0000 mg | Freq: Four times a day (QID) | INTRAMUSCULAR | Status: DC | PRN

## 2019-05-23 NOTE — Progress Notes (Signed)
   05/23/19 1935  Vitals  Temp 98.3 F (36.8 C)  Temp Source Oral  BP 104/64  MAP (mmHg) 77  BP Location Left Arm  BP Method Automatic  Patient Position (if appropriate) Lying  Pulse Rate (!) 107  Pulse Rate Source Monitor  Resp 20  Oxygen Therapy  SpO2 100 %  O2 Device Room Air  Glasgow Coma Scale  Eye Opening 4  Best Verbal Response (NON-intubated) 5  Modified Verbal Response (INTUBATED) 5  Best Motor Response 6  Glasgow Coma Scale Score (!) 20  MEWS Score  MEWS Temp 0  MEWS Systolic 0  MEWS Pulse 1  MEWS RR 0  MEWS LOC 0  MEWS Score 1  MEWS Score Color Green  Provider Notification  Provider Name/Title Dr.Matthews Benjamine Mola  Date Provider Notified 05/23/19  Time Provider Notified 1945  Notification Type Page  Notification Reason Other (Comment) (triggered mews yellow now is green.)  Response No new orders  Date of Provider Response  (no new orders noted chronic for patient low bp in the 90's)  Time of Provider Response 1946 (no further orders noted.)  Note  Observations  (rapid response not notified due to vs have normalized. )

## 2019-05-23 NOTE — Plan of Care (Signed)
  Problem: Education: Goal: Knowledge of General Education information will improve Description: Including pain rating scale, medication(s)/side effects and non-pharmacologic comfort measures Outcome: Progressing   Problem: Education: Goal: Ability to demonstrate management of disease process will improve Outcome: Progressing Goal: Ability to verbalize understanding of medication therapies will improve Outcome: Progressing Goal: Individualized Educational Video(s) Outcome: Progressing   Problem: Activity: Goal: Capacity to carry out activities will improve Outcome: Progressing   Problem: Cardiac: Goal: Ability to achieve and maintain adequate cardiopulmonary perfusion will improve Outcome: Progressing   Problem: Education: Goal: Knowledge of disease or condition will improve Outcome: Progressing Goal: Understanding of medication regimen will improve Outcome: Progressing Goal: Individualized Educational Video(s) Outcome: Progressing   Problem: Activity: Goal: Ability to tolerate increased activity will improve Outcome: Progressing   Problem: Cardiac: Goal: Ability to achieve and maintain adequate cardiopulmonary perfusion will improve Outcome: Progressing   Problem: Health Behavior/Discharge Planning: Goal: Ability to safely manage health-related needs after discharge will improve Outcome: Progressing   Problem: Education: Goal: Expressions of having a comfortable level of knowledge regarding the disease process will increase Outcome: Progressing   Problem: Coping: Goal: Ability to adjust to condition or change in health will improve Outcome: Progressing Goal: Ability to identify appropriate support needs will improve Outcome: Progressing   Problem: Health Behavior/Discharge Planning: Goal: Compliance with prescribed medication regimen will improve Outcome: Progressing   Problem: Medication: Goal: Risk for medication side effects will decrease Outcome:  Progressing   Problem: Clinical Measurements: Goal: Complications related to the disease process, condition or treatment will be avoided or minimized Outcome: Progressing Goal: Diagnostic test results will improve Outcome: Progressing   Problem: Safety: Goal: Verbalization of understanding the information provided will improve Outcome: Progressing   Problem: Self-Concept: Goal: Level of anxiety will decrease Outcome: Progressing Goal: Ability to verbalize feelings about condition will improve Outcome: Progressing

## 2019-05-23 NOTE — Progress Notes (Signed)
Mews score normal now has turned green. Had 2 previous yellow scores due to soft bp and heart rate. MD informed no further orders noted as patient has chronic SBP in the 90's. No new orders noted Dr.Matthews aware.

## 2019-05-23 NOTE — Progress Notes (Signed)
ANTICOAGULATION CONSULT NOTE - Initial Consult  Pharmacy Consult for heparin Indication: atrial fibrillation  No Known Allergies  Patient Measurements: Height: 6' (182.9 cm) Weight: 85 kg (187 lb 6.3 oz) IBW/kg (Calculated) : 77.6  Vital Signs: Temp: 98.2 F (36.8 C) (05/04 1601) Temp Source: Oral (05/04 1601) BP: 114/73 (05/04 2300) Pulse Rate: 115 (05/04 2300)  Labs: Recent Labs    05/22/19 1603  HGB 10.9*  HCT 36.4*  PLT 426*  CREATININE 1.15  TROPONINIHS 13    Estimated Creatinine Clearance: 73.1 mL/min (by C-G formula based on SCr of 1.15 mg/dL).   Medical History: Past Medical History:  Diagnosis Date  . CHF (congestive heart failure) ([place])   . Coronary artery disease   . Seizures ([place])    most recent today, 07/23/15    Assessment: 64yo male c/o LLE swelling, no evidence of DVT on Doppler, found to be in Afib/flutter w/ RVR >> cardioverted in ED, to transition from Xarelto to heparin during admission; last dose of Xarelto given 5/4 2139 in ED.  Goal of Therapy:  Heparin level 0.3-0.7 units/ml aPTT 66-102 seconds Monitor platelets by anticoagulation protocol: Yes   Plan:  On 5/5 at 2130 will start heparin gtt at 1600 units/hr and monitor heparin levels, aPTT (while anti-Xa affected by Xarelto), and CBC; baseline coag labs in the am prior to starting infusion.  Wynona Neat, PharmD, BCPS  05/23/2019,12:20 AM

## 2019-05-23 NOTE — Progress Notes (Signed)
Progress Note  Patient Name: Tony Saunders Date of Encounter: 05/23/2019  Primary Cardiologist: Glenetta Hew, MD   Subjective   Feeling OK.  No chest pain or shortness of breath.  Inpatient Medications    Scheduled Meds: . carvedilol  3.125 mg Oral BID WC  . lamoTRIgine  100 mg Oral BID  . levETIRAcetam  750 mg Oral BID  . rosuvastatin  5 mg Oral QHS  . sertraline  25 mg Oral Daily  . ticagrelor  90 mg Oral BID   Continuous Infusions: . azithromycin Stopped (05/23/19 0651)  . cefTRIAXone (ROCEPHIN)  IV Stopped (05/23/19 0548)  . heparin     PRN Meds: nitroGLYCERIN, ondansetron **OR** ondansetron (ZOFRAN) IV   Vital Signs    Vitals:   05/23/19 0500 05/23/19 0600 05/23/19 0800 05/23/19 0900  BP: 104/65 104/68 106/65 107/66  Pulse: (!) 102 (!) 104 (!) 102 100  Resp: (!) 29 (!) 34 (!) 23 (!) 25  Temp: 98.3 F (36.8 C)     TempSrc:      SpO2: 95% 95% 98% 96%  Weight:      Height:        Intake/Output Summary (Last 24 hours) at 05/23/2019 0935 Last data filed at 05/23/2019 [id number] Gross per 24 hour  Intake 350.34 ml  Output --  Net 350.34 ml   Last 3 Weights 05/22/2019 04/16/2019 03/31/2019  Weight (lbs) 187 lb 6.3 oz 189 lb 3.2 oz 193 lb 1.6 oz  Weight (kg) 85 kg 85.821 kg 87.59 kg      Telemetry    Sinus rhythm/sinus tachycardia - Personally Reviewed  ECG    05/23/19: Sinus rhythm.  Rate 71 bpm.  LVH.   - Personally Reviewed  Physical Exam   VS:  BP 107/66   Pulse 100   Temp 98.3 F (36.8 C)   Resp (!) 25   Ht 6' (1.829 m)   Wt 85 kg   SpO2 96%   BMI 25.41 kg/m  , BMI Body mass index is 25.41 kg/m. GENERAL:  Well appearing HEENT: Pupils equal round and reactive, fundi not visualized, oral mucosa unremarkable NECK:  No jugular venous distention, waveform within normal limits, carotid upstroke brisk and symmetric, no bruits LUNGS:  Clear to auscultation bilaterally HEART:  RRR.  PMI not displaced or sustained,S1 and S2 within normal limits, no S3, no  S4, no clicks, no rubs, no murmurs ABD:  Flat, positive bowel sounds normal in frequency in pitch, no bruits, no rebound, no guarding, no midline pulsatile mass, no hepatomegaly, no splenomegaly EXT:  2 plus pulses throughout, +L LE edema, no cyanosis no clubbing SKIN:  No rashes no nodules NEURO:  Cranial nerves II through XII grossly intact, motor grossly intact throughout [facility]:  Cognitively intact, oriented to person place and time   Labs    High Sensitivity Troponin:   Recent Labs  Lab 05/22/19 1603  TROPONINIHS 13      Chemistry Recent Labs  Lab 05/22/19 1603 05/23/19 0418  NA 135 135  K 4.2 3.7  CL 98 98  CO2 23 23  GLUCOSE 100* 89  BUN 15 15  CREATININE 1.15 1.20  CALCIUM 8.5* 8.0*  PROT  --  4.6*  ALBUMIN  --  2.0*  AST  --  29  ALT  --  23  ALKPHOS  --  66  BILITOT  --  0.6  GFRNONAA >60 >60  GFRAA >60 >60  ANIONGAP 14 14     Hematology  Recent Labs  Lab 05/22/19 1603 05/23/19 0418  WBC 18.2* 13.6*  RBC 4.31 3.82*  HGB 10.9* 9.7*  HCT 36.4* 32.0*  MCV 84.5 83.8  MCH 25.3* 25.4*  MCHC 29.9* 30.3  RDW 17.9* 18.0*  PLT 426* 308    BNPNo results for input(s): BNP, PROBNP in the last 168 hours.   DDimer No results for input(s): DDIMER in the last 168 hours.   Radiology    CT Abdomen Pelvis W Contrast  Result Date: 05/22/2019 CLINICAL DATA:  Left groin adenopathy and left lower extremity swelling. EXAM: CT ABDOMEN AND PELVIS WITH CONTRAST TECHNIQUE: Multidetector CT imaging of the abdomen and pelvis was performed using the standard protocol following bolus administration of intravenous contrast. CONTRAST:  143mL OMNIPAQUE IOHEXOL 300 MG/ML  SOLN COMPARISON:  CT scan 05/20/2004 FINDINGS: Lower chest: Moderate-sized bilateral pleural effusions with overlying atelectasis. Coronary artery calcifications are noted. Hepatobiliary: No focal hepatic lesions or intrahepatic biliary dilatation. Small calcified gallstones are noted the gallbladder. No intra or  extrahepatic biliary dilatation. Pancreas: No mass or inflammation. No ductal dilatation. Spleen: Spleen is mildly enlarged and there are splenic lesions. Adrenals/Urinary Tract: The adrenal glands are unremarkable. The right kidney is infiltrated with tumor. 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 The right renal artery is patent but the right renal vein appears to be at least partially occluded. The left kidney is unremarkable. The bladder is grossly normal. Stomach/Bowel: The stomach, duodenum, small bowel and colon are grossly normal without oral contrast. No acute inflammatory changes, mass lesions or obstructive findings. The terminal ileum is normal. The appendix is normal. Vascular/Lymphatic: Moderate age advanced atherosclerotic calcifications involving the aorta iliac arteries. The major venous structures are patent. The right renal vein appears to be at least partially occluded. The portal and splenic veins are patent. Extensive retroperitoneal lymphadenopathy up lifting the aorta. Nodal mass on image 27/3 measures 5.8 x 4.2 cm. Bulk E pelvic adenopathy with cystic/degenerative type changes involving the left obturator lymph nodes. There is also extensive left inguinal adenopathy measuring approximately 8.7 x 7.2 cm. There is also right inguinal adenopathy. Reproductive: The prostate gland and seminal vesicles are unremarkable. Other: Mild presacral edema and small amount of free pelvic fluid. Musculoskeletal: Findings suspicious for infiltrating bone disease mainly involving the right iliac bone and the left pubic bone. IMPRESSION: 1. 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 Extensive retroperitoneal, pelvic and inguinal lymphadenopathy along with involvement of the spleen and near complete infiltration of the right kidney. 2. Probable occlusions/infiltration of the right renal vein. 3. Findings suspicious for infiltrating bone disease mainly involving the right iliac bone and the  left pubic bone. 4. Moderate-sized bilateral pleural effusions with overlying atelectasis. 5. Cholelithiasis. 6. Age advanced atherosclerotic calcifications involving the aorta and iliac arteries. Aortic Atherosclerosis (ICD10-I70.0). Electronically Signed   By: Marijo Sanes M.D.   On: 05/22/2019 21:01   DG Chest Portable 1 View  Result Date: 05/22/2019 CLINICAL DATA:  Atrial fibrillation, short of breath, lower extremity edema EXAM: PORTABLE CHEST 1 VIEW COMPARISON:  03/27/2019 FINDINGS: Single frontal view of the chest demonstrates external defibrillator pad overlying left hilum. Cardiac silhouette is enlarged but stable. Bibasilar veiling opacities, right greater than left, consistent with airspace disease and effusions. No pneumothorax. IMPRESSION: 1. Findings consistent with pulmonary edema, with bibasilar airspace disease and effusions. Electronically Signed   By: Randa Ngo M.D.   On: 05/22/2019 17:50   DG Hip Port Unilat W or Wo Pelvis 1 View Left  Result  Date: 05/22/2019 CLINICAL DATA:  AFib, shortness of breath EXAM: DG HIP (WITH OR WITHOUT PELVIS) 1V PORT LEFT COMPARISON:  None. FINDINGS: No displaced fracture or dislocation of the left hip or included pelvis. There is moderate joint space narrowing and osteophytosis. Nonobstructive pattern of overlying bowel gas. IMPRESSION: No displaced fracture or dislocation of the left hip. There is moderate joint space narrowing and osteophytosis. Electronically Signed   By: Eddie Candle M.D.   On: 05/22/2019 17:51   VAS [place] LOWER EXTREMITY VENOUS (DVT) (MC and WL 7a-7p)  Result Date: 05/22/2019  Lower Venous DVTStudy Indications: Pain, Swelling, and extreme unilateral swelling.  Limitations: Poor ultrasound/tissue interface. Performing Technologist: Antonieta Pert RDMS, RVT  Examination Guidelines: A complete evaluation includes B-mode imaging, spectral Doppler, color Doppler, and power Doppler as needed of all accessible portions of each vessel.  Bilateral testing is considered an integral part of a complete examination. Limited examinations for reoccurring indications may be performed as noted. The reflux portion of the exam is performed with the patient in reverse Trendelenburg.  +-----+---------------+---------+-----------+----------+--------------+ RIGHTCompressibilityPhasicitySpontaneityPropertiesThrombus Aging +-----+---------------+---------+-----------+----------+--------------+ CFV  Full           Yes      Yes                                 +-----+---------------+---------+-----------+----------+--------------+   +---------+---------------+---------+-----------+----------+-------------------+ LEFT     CompressibilityPhasicitySpontaneityPropertiesThrombus Aging      +---------+---------------+---------+-----------+----------+-------------------+ CFV                     Yes      Yes                  unable to compress                                                        due to adjecent                                                           enlarged lymph                                                            nodes               +---------+---------------+---------+-----------+----------+-------------------+ SFJ                     Yes      Yes                                      +---------+---------------+---------+-----------+----------+-------------------+ FV Prox  Full                                                             +---------+---------------+---------+-----------+----------+-------------------+  FV Mid   Full                                                             +---------+---------------+---------+-----------+----------+-------------------+ FV DistalFull                                                             +---------+---------------+---------+-----------+----------+-------------------+ PFV      Full                                                              +---------+---------------+---------+-----------+----------+-------------------+ POP      Full           Yes      Yes                                      +---------+---------------+---------+-----------+----------+-------------------+ PTV      Full                                                             +---------+---------------+---------+-----------+----------+-------------------+ PERO     Full                                                             +---------+---------------+---------+-----------+----------+-------------------+ GSV                     Yes      Yes                                      +---------+---------------+---------+-----------+----------+-------------------+     Summary: RIGHT: - No evidence of deep vein thrombosis in the lower extremity. No indirect evidence of obstruction proximal to the inguinal ligament. - prominent lymph nodes noted - Ultrasound characteristics of enlarged lymph nodes are noted in the groin.  LEFT: - There is no evidence of deep vein thrombosis in the lower extremity. However, portions of this examination were limited- see technologist comments above.  - multiple enlarged lymph nodes in left groin, largest measuring 5cm. - Ultrasound characteristics of enlarged lymph nodes noted in the groin.  *See table(s) above for measurements and observations.    Preliminary     Cardiac Studies   Echo 05/14/19:  1. Compared to echo in March 2021, no significant change in LVEF or MR.  Pt is now in atrial fibrillation.  2.  LVEF is severely depressed with severe hypokinesis/akinesis of the  base/mid inferior, inferosepal, infer walls and apical walls; hypokinesis  elsewhere . Left ventricular ejection fraction, by estimation, is 30%. The  left ventricle has severely  decreased function. The left ventricular internal cavity size was mildly  dilated. There is mild left ventricular hypertrophy. Left  ventricular  diastolic parameters are indeterminate.  3. Right ventricular systolic function is normal. The right ventricular  size is normal. There is normal pulmonary artery systolic pressure.  4. Left atrial size was mildly dilated.  5. MR is directed posteriorly into LA, likely due to tethering of  posterior leaflet. Severe mitral valve regurgitation.  6. The aortic valve is tricuspid. Aortic valve regurgitation is not  visualized. Mild aortic valve sclerosis is present, with no evidence of  aortic valve stenosis.  7. The inferior vena cava is normal in size with greater than 50%  respiratory variability, suggesting right atrial pressure of 3 mmHg.   Patient Profile     64 y.o. male with CAD status post recent PCI (03/2019 left circumflex and RCA), COPD, chronic systolic and diastolic heart failure (LVEF 30 to 35%) moderate to severe mitral regurgitation, and paroxysmal atrial fibrillation here with left lower extremity edema.  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  Assessment & Plan    # Paroxysmal atrial flutter: Patient was asymptomatic on admission.  He was noted to be in atrial flutter with rapid ventricular response.  He underwent cardioversion in the ED and is now maintaining sinus rhythm.  Continue carvedilol.  His Xarelto is on hold and he is currently on heparin.  He will likely need a biopsy.  Given that he underwent cardioversion he really should not be off anticoagulation for at least a month.  Certainly, risk/benefit will have to be reviewed if this needs to be held for hemostasis after biopsy.  #CAD: # Hyperlipidemia:  Mr. Koen has extensive stenting of the left circumflex into OM 3.  He also has an LAD stent.  These were both done just 2 months ago.  Continue ticagrelor, rosuvastatin, and beta-blocker.  # Severe MR: # Chronic systolic and diastolic heart failure:  Volume status stable.   LLE edema likely obstructive.  # Transaminitis: Resolved.  Unclear if it was due to his acute MI or alcohol abuse.  Currently denies alcohol use.  # Lymphoma: CT scan this admission was consistent with a high-grade/aggressive lymphoma with extensive retroperitoneal, pelvic, and inguinal lymphadenopathy.  There is also involvement of the spleen and near complete infiltration of the right kidney.  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  Suspicious for infiltrating disease of the right iliac and left pubic bones.  This is a very difficult situation.  Antiplatelets must be continued due to concern for stent thrombosis.  If anticoagulation must be held for biopsy that is OK but not ideal given his recent DCCV.        For questions or updates, please contact [place] Please consult [email or web address] for contact info under        Signed, Skeet Latch, MD  05/23/2019, 9:35 AM
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
## 2019-05-23 NOTE — Consult Note (Addendum)
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                                      +---------+---------------+---------+-----------+----------+-------------------+ PTV      Full                                                             +---------+---------------+---------+-----------+----------+-------------------+ PERO     Full                                                             +---------+---------------+---------+-----------+----------+-------------------+ GSV                     Yes      Yes                                      +---------+---------------+---------+-----------+----------+-------------------+     Summary: RIGHT: - No evidence of deep vein thrombosis in the lower extremity. No indirect evidence of obstruction proximal to the inguinal ligament. - prominent lymph nodes noted - Ultrasound characteristics of enlarged lymph nodes are noted in the groin.  LEFT: - There is no evidence of deep vein thrombosis in the lower extremity.  However, portions of this examination were limited- see technologist comments above.  - multiple enlarged lymph nodes in left groin, largest measuring 5cm. - Ultrasound characteristics of enlarged lymph nodes noted in the groin.  *See table(s) above for measurements and observations. Electronically signed by Deitra Mayo MD on 05/23/2019 at 11:03:50 AM.    Final      CT Abdomen Pelvis W Contrast  Result Date: 05/22/2019 CLINICAL DATA:  Left groin adenopathy and left lower extremity swelling. EXAM: CT ABDOMEN AND PELVIS WITH CONTRAST TECHNIQUE: Multidetector CT imaging of the abdomen and pelvis was performed using the standard protocol following bolus administration of intravenous contrast. CONTRAST:  140m OMNIPAQUE IOHEXOL 300 MG/ML  SOLN COMPARISON:  CT scan 05/20/2004 FINDINGS: Lower chest: Moderate-sized bilateral pleural effusions with overlying atelectasis. Coronary artery calcifications are noted. Hepatobiliary: No focal hepatic lesions or intrahepatic  biliary dilatation. Small calcified gallstones are noted the gallbladder. No intra or extrahepatic biliary dilatation. Pancreas: No mass or inflammation. No ductal dilatation. Spleen: Spleen is mildly enlarged and there are splenic lesions. Adrenals/Urinary Tract: The adrenal glands are unremarkable. The right kidney is infiltrated with tumor. 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 The right renal artery is patent but the right renal vein appears to be at least partially occluded. The left kidney is unremarkable. The bladder is grossly normal. Stomach/Bowel: The stomach, duodenum, small bowel and colon are grossly normal without oral contrast. No acute inflammatory changes, mass lesions or obstructive findings. The terminal ileum is normal. The appendix is normal. Vascular/Lymphatic: Moderate age advanced atherosclerotic calcifications involving the aorta iliac arteries. The major venous structures are patent. The right renal vein appears to be at  least partially occluded. The portal and splenic veins are patent. Extensive retroperitoneal lymphadenopathy up lifting the aorta. Nodal mass on image 27/3 measures 5.8 x 4.2 cm. Bulk E pelvic adenopathy with cystic/degenerative type changes involving the left obturator lymph nodes. There is also extensive left inguinal adenopathy measuring approximately 8.7 x 7.2 cm. There is also right inguinal adenopathy. Reproductive: The prostate gland and seminal vesicles are unremarkable. Other: Mild presacral edema and small amount of free pelvic fluid. Musculoskeletal: Findings suspicious for infiltrating bone disease mainly involving the right iliac bone and the left pubic bone. IMPRESSION: 1. 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 Extensive retroperitoneal, pelvic and inguinal lymphadenopathy along with involvement of the spleen and near complete infiltration of the right kidney. 2. Probable occlusions/infiltration of the right renal vein. 3. Findings suspicious for infiltrating bone disease mainly involving the right iliac bone and the left pubic bone. 4. Moderate-sized bilateral pleural effusions with overlying atelectasis. 5. Cholelithiasis. 6. Age advanced atherosclerotic calcifications involving the aorta and iliac arteries. Aortic Atherosclerosis (ICD10-I70.0). Electronically Signed   By: Marijo Sanes M.D.   On: 05/22/2019 21:01   DG Chest Portable 1 View  Result Date: 05/22/2019 CLINICAL DATA:  Atrial fibrillation, short of breath, lower extremity edema EXAM: PORTABLE CHEST 1 VIEW COMPARISON:  03/27/2019 FINDINGS: Single frontal view of the chest demonstrates external defibrillator pad overlying left hilum. Cardiac silhouette is enlarged but stable. Bibasilar veiling opacities, right greater than left, consistent with airspace disease and effusions. No pneumothorax. IMPRESSION: 1. Findings consistent with pulmonary edema, with bibasilar airspace disease and effusions. Electronically  Signed   By: Randa Ngo M.D.   On: 05/22/2019 17:50   ECHOCARDIOGRAM COMPLETE  Result Date: 05/14/2019    ECHOCARDIOGRAM REPORT   Patient Name:   ROLLYN SCIALDONE Date of Exam: 05/14/2019 Medical Rec #:  [id number]     Height:       72.0 in Accession #:    [id number]    Weight:       189.2 lb Date of Birth:  [birth year]     BSA:          2.081 m Patient Age:    64 years      BP:           92/66 mmHg Patient Gender: M             HR:           106 bpm. Exam Location:  [facility] Procedure: 2D Echo, Cardiac Doppler and Color Doppler Indications:    I25.5 Ischemic cardiomyopathy; I05.9 Mitral valve disorder  History:        Patient has prior history of Echocardiogram examinations, most  recent 03/28/2019. CAD and Previous Myocardial Infarction, COPD,                 Arrythmias:Atrial Fibrillation; Risk Factors:Dyslipidemia.                 Ischemic cardiomyopathy. Seizure disorder.  Sonographer:    Diamond Nickel RCS Referring Phys: [place]  Sonographer Comments: Notified Dr. Johnsie Cancel (DOD) of the patient's blood pressure reading 92/66. Per Dr. Johnsie Cancel, patient ok to be discharged. Instructed patient to follow-up with Kerin Ransom. IMPRESSIONS  1. Compared to echo in March 2021, no significant change in LVEF or MR. Pt is now in atrial fibrillation.  2. LVEF is severely depressed with severe hypokinesis/akinesis of the base/mid inferior, inferosepal, infer walls and apical walls; hypokinesis elsewhere . Left ventricular ejection fraction, by estimation, is 30%. The left ventricle has severely decreased function. The left ventricular internal cavity size was mildly dilated. There is mild left ventricular hypertrophy. Left ventricular diastolic parameters are indeterminate.  3. Right ventricular systolic function is normal. The right ventricular size is normal. There is normal pulmonary artery systolic pressure.  4. Left atrial size was mildly dilated.  5. MR is directed posteriorly into LA,  likely due to tethering of posterior leaflet. Severe mitral valve regurgitation.  6. The aortic valve is tricuspid. Aortic valve regurgitation is not visualized. Mild aortic valve sclerosis is present, with no evidence of aortic valve stenosis.  7. The inferior vena cava is normal in size with greater than 50% respiratory variability, suggesting right atrial pressure of 3 mmHg. FINDINGS  Left Ventricle: LVEF is severely depressed with severe hypokinesis/akinesis of the base/mid inferior, inferosepal, infer walls and apical walls; hypokinesis elsewhere. Left ventricular ejection fraction, by estimation, is 30%%. The left ventricle has severely decreased function. The left ventricle demonstrates regional wall motion abnormalities. The left ventricular internal cavity size was mildly dilated. There is mild left ventricular hypertrophy. Left ventricular diastolic parameters are indeterminate. Right Ventricle: The right ventricular size is normal. Right vetricular wall thickness was not assessed. Right ventricular systolic function is normal. There is normal pulmonary artery systolic pressure. The tricuspid regurgitant velocity is 2.41 m/s, and with an assumed right atrial pressure of 10 mmHg, the estimated right ventricular systolic pressure is 81.4 mmHg. Left Atrium: Left atrial size was mildly dilated. Right Atrium: Right atrial size was normal in size. Pericardium: Trivial pericardial effusion is present. Mitral Valve: MR is directed posteriorly into LA, likely due to tethering of posterior leaflet. The mitral valve is grossly normal. Severe mitral valve regurgitation, with posteriorly-directed jet. Tricuspid Valve: The tricuspid valve is normal in structure. Tricuspid valve regurgitation is mild. Aortic Valve: The aortic valve is tricuspid. Aortic valve regurgitation is not visualized. Mild aortic valve sclerosis is present, with no evidence of aortic valve stenosis. Pulmonic Valve: The pulmonic valve was normal in  structure. Pulmonic valve regurgitation is not visualized. Aorta: The aortic root and ascending aorta are structurally normal, with no evidence of dilitation. Venous: The inferior vena cava is normal in size with greater than 50% respiratory variability, suggesting right atrial pressure of 3 mmHg. IAS/Shunts: No atrial level shunt detected by color flow Doppler.  LEFT VENTRICLE PLAX 2D LVIDd:         5.50 cm LVIDs:         4.80 cm LV PW:         1.10 cm LV IVS:        1.20 cm LVOT diam:  2.35 cm LV SV:         75 LV SV Index:   36 LVOT Area:     4.34 cm  RIGHT VENTRICLE RV Basal diam:  2.10 cm TAPSE (M-mode): 1.7 cm RVSP:           26.2 mmHg LEFT ATRIUM             Index       RIGHT ATRIUM           Index LA diam:        4.60 cm 2.21 cm/m  RA Pressure: 3.00 mmHg LA Vol (A2C):   75.8 ml 36.43 ml/m RA Area:     17.90 cm LA Vol (A4C):   61.7 ml 29.65 ml/m RA Volume:   44.40 ml  21.34 ml/m LA Biplane Vol: 75.0 ml 36.04 ml/m  AORTIC VALVE LVOT Vmax:   98.93 cm/s LVOT Vmean:  62.767 cm/s LVOT VTI:    0.172 m  AORTA Ao Root diam: 3.10 cm MR Peak grad:    81.6 mmHg   TRICUSPID VALVE MR Mean grad:    51.8 mmHg   TR Peak grad:   23.2 mmHg MR Vmax:         451.80 cm/s TR Vmax:        241.00 cm/s MR Vmean:        336.4 cm/s  Estimated RAP:  3.00 mmHg MR PISA:         3.08 cm    RVSP:           26.2 mmHg MR PISA Eff ROA: 19 mm MR PISA Radius:  0.70 cm     SHUNTS                              Systemic VTI:  0.17 m                              Systemic Diam: 2.35 cm Dorris Carnes MD Electronically signed by Dorris Carnes MD Signature Date/Time: 05/14/2019/3:13:00 PM    Final    DG Hip Port Unilat W or Wo Pelvis 1 View Left  Result Date: 05/22/2019 CLINICAL DATA:  AFib, shortness of breath EXAM: DG HIP (WITH OR WITHOUT PELVIS) 1V PORT LEFT COMPARISON:  None. FINDINGS: No displaced fracture or dislocation of the left hip or included pelvis. There is moderate joint space narrowing and osteophytosis. Nonobstructive pattern of  overlying bowel gas. IMPRESSION: No displaced fracture or dislocation of the left hip. There is moderate joint space narrowing and osteophytosis. Electronically Signed   By: Eddie Candle M.D.   On: 05/22/2019 17:51   VAS [place] LOWER EXTREMITY VENOUS (DVT) ([facility] and [facility] 7a-7p)  Result Date: 05/23/2019  Lower Venous DVTStudy Indications: Pain, Swelling, and extreme unilateral swelling.  Limitations: Poor ultrasound/tissue interface. Performing Technologist: Antonieta Pert RDMS, RVT  Examination Guidelines: A complete evaluation includes B-mode imaging, spectral Doppler, color Doppler, and power Doppler as needed of all accessible portions of each vessel. Bilateral testing is considered an integral part of a complete examination. Limited examinations for reoccurring indications may be performed as noted. The reflux portion of the exam is performed with the patient in reverse Trendelenburg.  +-----+---------------+---------+-----------+----------+--------------+ RIGHTCompressibilityPhasicitySpontaneityPropertiesThrombus Aging +-----+---------------+---------+-----------+----------+--------------+ CFV  Full           Yes      Yes                                 +-----+---------------+---------+-----------+----------+--------------+   +---------+---------------+---------+-----------+----------+-------------------+  LEFT     CompressibilityPhasicitySpontaneityPropertiesThrombus Aging      +---------+---------------+---------+-----------+----------+-------------------+ CFV                     Yes      Yes                  unable to compress                                                        due to adjecent                                                           enlarged lymph                                                            nodes               +---------+---------------+---------+-----------+----------+-------------------+ SFJ                     Yes      Yes                                       +---------+---------------+---------+-----------+----------+-------------------+ FV Prox  Full                                                             +---------+---------------+---------+-----------+----------+-------------------+ FV Mid   Full                                                             +---------+---------------+---------+-----------+----------+-------------------+ FV DistalFull                                                             +---------+---------------+---------+-----------+----------+-------------------+ PFV      Full                                                             +---------+---------------+---------+-----------+----------+-------------------+ POP      Full  Yes      Yes                                      +---------+---------------+---------+-----------+----------+-------------------+ PTV      Full                                                             +---------+---------------+---------+-----------+----------+-------------------+ PERO     Full                                                             +---------+---------------+---------+-----------+----------+-------------------+ GSV                     Yes      Yes                                      +---------+---------------+---------+-----------+----------+-------------------+     Summary: RIGHT: - No evidence of deep vein thrombosis in the lower extremity. No indirect evidence of obstruction proximal to the inguinal ligament. - prominent lymph nodes noted - Ultrasound characteristics of enlarged lymph nodes are noted in the groin.  LEFT: - There is no evidence of deep vein thrombosis in the lower extremity. However, portions of this examination were limited- see technologist comments above.  - multiple enlarged lymph nodes in left groin, largest measuring 5cm. - Ultrasound characteristics of  enlarged lymph nodes noted in the groin.  *See table(s) above for measurements and observations. Electronically signed by Deitra Mayo MD on 05/23/2019 at 11:03:50 AM.    Final    Assessment and Plan: This is a 64 year old male with: 1.  Widespread lymphadenopathy concerning for lymphoma.  Exam and CT findings were discussed with the patient and his wife.  We discussed that findings are highly concerning for lymphoma.  We discussed that we need a biopsy to confirm the diagnosis.  Recommend CT-guided biopsy of one of his inguinal lymph nodes by IR to obtain tissue.  Once we have this information, we can discuss the diagnosis and treatment options.  We will also need to fully stage him including imaging of his chest and bone marrow biopsy.  He will be seen by Dr. Alen Blew to determine if he needs a CT of the chest versus an outpatient PET scan and the timing of bone marrow biopsy.  2.  Atrial flutter with RVR status post cardioversion.  Now in sinus rhythm.  He is on carvedilol and Xarelto will be held and he will be transitioned to heparin for procedures.  Continue management per cardiology.  3. Anemia.  Likely due to an underlying malignancy.  Hemoglobin has been stable for the past month.  Continue to monitor.  Would consider transfusion only if hemoglobin is less than 7.  4. Leukocytosis.  Reactive versus early pneumonia.  Monitor.  Antibiotics per hospitalist.  5.  Left lower extremity edema: likely due to obstruction from lymphadenopathy.  Will obtain biopsy and  further work-up for possible malignancy as noted above.  6.  CAD status post recent stenting following acute MI in March 2021: Management per cardiology  7.  Ischemic cardiomyopathy: Most recent LVEF is 30% on echocardiogram from 05/14/2019.  Management per cardiology.  8.  History of epilepsy: Maintained on Lamictal and Keppra.  He is followed by neurology as an outpatient.  No recent seizures reported.   Thank you for this  referral.   Mikey Bussing, DNP, AGPCNP-BC, AOCNP  Patient seen and examined personally.  Please see note above.  64 year old man with history of coronary disease presented with lower extremity edema and developed atrial flutter and required cardioversion.  CT scan of the abdomen and pelvis showed extensive lymphadenopathy and bone disease.  On exam today he was awake alert appeared without any distress.  Overall frail.  Heart was regular rate without any murmurs.  Lungs are clear to auscultation.  Abdomen is soft with pronounced left lower extremity edema.  Laboratory data and CT scan were personally reviewed and discussed with patient today.  His white cell count was elevated 18.2 with a hemoglobin of 10.9  Assessment and recommendation:  His imaging study as well as presentation is consistent with advanced malignancy although the primary is unknown at this time.  The differential diagnosis was discussed which could represents lymphoma versus metastatic malignancy from a solid tumor.  Management recommendation would include proceeding with a tissue biopsy at this time.  The risks of holding anticoagulation was discussed from a cardiac standpoint but he understands that without assessments of his malignancy and obtaining tissue biopsy, the risk of progressing from this cancer is just as lethal as cardiac event.  After obtaining tissue biopsy will arrange follow-up in outpatient for further management.  PET CT scan will likely be done at this time.  Bone marrow biopsy needed will be assessed at a later date.  All his questions were answered today to his satisfaction.  Prognosis as well as treatment options were reviewed today in detail but but remain hypothetical till we know the diagnosis.  He understands he has a serious diagnosis with a life-threatening condition likely require intensive treatment and possibly chemotherapy.  80  minutes were dedicated to this encounter.  More than 50% of the  time was dedicated to face-to-face discussion to include review of his CT scan, laboratory data, discussing differential diagnosis and management options as well as prognosis.

## 2019-05-23 NOTE — Progress Notes (Signed)
64 year old male with a history of CAD admitted this morning with A. fib RVR and left lower extremity edema. No DVT seen by ultrasound. 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 Extensive retroperitoneal, pelvic and inguinal lymphadenopathy along with involvement of the spleen and near complete infiltration of the right kidney. Probable occlusions/infiltration of the right renal vein.  Findings suspicious for infiltrating bone disease mainly involving the right iliac bone and the left pubic bone. Moderate-sized bilateral pleural effusions with overlying atelectasis. Cholelithiasis. Age advanced atherosclerotic calcifications involving the aorta and iliac arteries. Seen by cardiology and oncology Consulted IR for CT-guided biopsy of the lymph node
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
## 2019-05-24 ENCOUNTER — Inpatient Hospital Stay (HOSPITAL_COMMUNITY): Payer: No Typology Code available for payment source

## 2019-05-24 ENCOUNTER — Encounter (HOSPITAL_COMMUNITY): Payer: Self-pay | Admitting: Internal Medicine

## 2019-05-24 ENCOUNTER — Other Ambulatory Visit: Payer: Self-pay

## 2019-05-24 DIAGNOSIS — I4891 Unspecified atrial fibrillation: Secondary | ICD-10-CM | POA: Diagnosis not present

## 2019-05-24 DIAGNOSIS — R591 Generalized enlarged lymph nodes: Secondary | ICD-10-CM | POA: Diagnosis not present

## 2019-05-24 LAB — CBC
HCT: 32.2 % — ABNORMAL LOW (ref 39.0–52.0)
Hemoglobin: 9.6 g/dL — ABNORMAL LOW (ref 13.0–17.0)
MCH: 24.9 pg — ABNORMAL LOW (ref 26.0–34.0)
MCHC: 29.8 g/dL — ABNORMAL LOW (ref 30.0–36.0)
MCV: 83.6 fL (ref 80.0–100.0)
Platelets: 282 10*3/uL (ref 150–400)
RBC: 3.85 MIL/uL — ABNORMAL LOW (ref 4.22–5.81)
RDW: 17.8 % — ABNORMAL HIGH (ref 11.5–15.5)
WBC: 11.5 10*3/uL — ABNORMAL HIGH (ref 4.0–10.5)
nRBC: 0 % (ref 0.0–0.2)

## 2019-05-24 LAB — APTT: aPTT: 88 seconds — ABNORMAL HIGH (ref 24–36)

## 2019-05-24 MED ORDER — FENTANYL CITRATE (PF) 100 MCG/2ML IJ SOLN
INTRAMUSCULAR | Status: AC
  Filled 2019-05-24: qty 2

## 2019-05-24 MED ORDER — LIDOCAINE HCL (PF) 1 % IJ SOLN
INTRAMUSCULAR | Status: AC
  Filled 2019-05-24: qty 30

## 2019-05-24 MED ORDER — RIVAROXABAN 20 MG PO TABS: 20.0000 mg | ORAL_TABLET | Freq: Every day | ORAL | Status: DC

## 2019-05-24 MED ORDER — FENTANYL CITRATE (PF) 100 MCG/2ML IJ SOLN
INTRAMUSCULAR | Status: AC | PRN
  Administered 2019-05-24: 50 ug via INTRAVENOUS

## 2019-05-24 MED ORDER — HEPARIN (PORCINE) 25000 UT/250ML-% IV SOLN
1600.0000 [IU]/h | INTRAVENOUS | Status: DC
  Administered 2019-05-24: 1600 [IU]/h via INTRAVENOUS

## 2019-05-24 MED ORDER — MIDAZOLAM HCL 2 MG/2ML IJ SOLN
INTRAMUSCULAR | Status: AC
  Filled 2019-05-24: qty 2

## 2019-05-24 MED ORDER — ADULT MULTIVITAMIN W/MINERALS CH
1.0000 | ORAL_TABLET | Freq: Every day | ORAL | Status: DC
  Administered 2019-05-24 – 2019-05-25 (×2): 1 via ORAL
  Filled 2019-05-24 (×2): qty 1

## 2019-05-24 MED ORDER — MIDAZOLAM HCL 2 MG/2ML IJ SOLN
INTRAMUSCULAR | Status: AC | PRN
  Administered 2019-05-24: 1 mg via INTRAVENOUS

## 2019-05-24 MED ORDER — RIVAROXABAN 20 MG PO TABS
20.0000 mg | ORAL_TABLET | Freq: Every day | ORAL | Status: DC
  Administered 2019-05-24: 20 mg via ORAL
  Filled 2019-05-24: qty 1

## 2019-05-24 NOTE — Progress Notes (Addendum)
Pt given Brilinta at 1700 after it was on hold this morning. Pharmacy called, instructed to not give this evenings dose. Will continue with next dose in the morning.

## 2019-05-24 NOTE — Progress Notes (Signed)
   05/24/19 1642  Assess: MEWS Score  BP 114/68  Pulse Rate (!) 114  Assess: MEWS Score  MEWS Temp 0  MEWS Systolic 0  MEWS Pulse 2  MEWS RR 0  MEWS LOC 0  MEWS Score 2  MEWS Score Color Yellow  Assess: if the MEWS score is Yellow or Red  Were vital signs taken at a resting state? Yes  Early Detection of Sepsis Score *See Row Information* Low  MEWS guidelines implemented *See Row Information* No, other (Comment)  Treat  MEWS Interventions  (chronic tachycardia MD did not change tx plan)  Take Vital Signs  Increase Vital Sign Frequency  Yellow: Q 2hr X 2 then Q 4hr X 2, if remains yellow, continue Q 4hrs  Escalate  MEWS: Escalate Yellow: discuss with charge nurse/RN and consider discussing with provider and RRT  Notify: Charge Nurse/RN  Name of Charge Nurse/RN Notified Eun,Rn.  Date Charge Nurse/RN Notified 05/24/19  Time Charge Nurse/RN Notified 1700  Notify: Provider  Provider Name/Title Dr.Matthews,E.  Date Provider Notified 05/24/19  Time Provider Notified 1711  Notification Type Call  Notification Reason Other (Comment) (yellow mews/tachycardia chronic)  Response No new orders  Date of Provider Response 05/24/19  Time of Provider Response 1711  Notify: Rapid Response  Name of Rapid Response RN Notified  (not done tx plan has not changed.)  Document  Patient Outcome Other (Comment) (patient stable with this heart rate.)  Progress note created (see row info)  (no new orders patient is stable.)  Patient is stable no distress has chronic tachycardia is much improved from his admission heart rate MD states will continue to follow regimen laid out via cardiology. NO new orders noted.

## 2019-05-24 NOTE — Progress Notes (Signed)
[place] for Heparin Indication: atrial fibrillation  No Known Allergies  Patient Measurements: Height: 6' (182.9 cm) Weight: 80.3 kg (177 lb 0.5 oz) IBW/kg (Calculated) : 77.6  Vital Signs: Temp: 98.5 F (36.9 C) (05/06 1056) Temp Source: Oral (05/06 1056) BP: 108/74 (05/06 1056) Pulse Rate: 108 (05/06 1056)  Labs: Recent Labs    05/22/19 1603 05/22/19 1603 05/23/19 0418 05/24/19 0419  HGB 10.9*   < > 9.7* 9.6*  HCT 36.4*  --  32.0* 32.2*  PLT 426*  --  308 282  APTT  --   --  37* 88*  HEPARINUNFRC  --   --  >2.20*  --   CREATININE 1.15  --  1.20  --   TROPONINIHS 13  --   --   --    < > = values in this interval not displayed.    Estimated Creatinine Clearance: 70.1 mL/min (by C-G formula based on SCr of 1.2 mg/dL).   Medical History: Past Medical History:  Diagnosis Date  . CHF (congestive heart failure) ([place])   . Coronary artery disease   . Myocardial infarction ([place])   . Seizures ([place])    most recent today, 07/23/15    Assessment: 89 yoM on Xarelto PTA for hx AFib admitted with AFib RVR. Pt started on IV heparin with need for biopsy. Heparin held this am for procedure, per IR ok to resume 3h post (~1400).   Goal of Therapy:  Heparin level 0.3-0.7 units/ml aPTT 66-102 seconds Monitor platelets by anticoagulation protocol: Yes   Plan:  -Resume heparin 1600 units/h no bolus at 1400 -Recheck aPTT in 6h   Arrie Senate, PharmD, BCPS Clinical Pharmacist [phone] Please check AMION for all [place] numbers 05/24/2019

## 2019-05-24 NOTE — Progress Notes (Signed)
Progress Note  Patient Name: Tony Saunders Date of Encounter: 05/24/2019  Primary Cardiologist: Glenetta Hew, MD   Subjective   Feeling OK.  No chest pain or shortness of breath. Biopsy wasn't as bad as he imagined.   Inpatient Medications    Scheduled Meds: . carvedilol  3.125 mg Oral BID WC  . feeding supplement (ENSURE ENLIVE)  237 mL Oral BID BM  . fentaNYL      . lamoTRIgine  100 mg Oral BID  . levETIRAcetam  750 mg Oral BID  . lidocaine (PF)      . midazolam      . rosuvastatin  5 mg Oral QHS  . sertraline  25 mg Oral Daily  . ticagrelor  90 mg Oral BID   Continuous Infusions: . azithromycin Stopped (05/23/19 0651)  . cefTRIAXone (ROCEPHIN)  IV 2 g (05/24/19 1129)  . heparin     PRN Meds: nitroGLYCERIN, ondansetron **OR** ondansetron (ZOFRAN) IV   Vital Signs    Vitals:   05/24/19 1005 05/24/19 1028 05/24/19 1056 05/24/19 1143  BP: 111/68 112/69 108/74 98/78  Pulse: (!) 110 (!) 109 (!) 108 (!) 109  Resp: (!) 24 18 16 18   Temp:  98.2 F (36.8 C) 98.5 F (36.9 C) 98.3 F (36.8 C)  TempSrc:  Oral Oral Oral  SpO2: 96% 97% 99% 99%  Weight:      Height:        Intake/Output Summary (Last 24 hours) at 05/24/2019 1229 Last data filed at 05/23/2019 2034 Gross per 24 hour  Intake 1340 ml  Output 200 ml  Net 1140 ml   Last 3 Weights 05/24/2019 05/22/2019 04/16/2019  Weight (lbs) 177 lb 0.5 oz 187 lb 6.3 oz 189 lb 3.2 oz  Weight (kg) 80.3 kg 85 kg 85.821 kg      Telemetry    Sinus rhythm/sinus tachycardia - Personally Reviewed  ECG    05/23/19: Sinus rhythm.  Rate 71 bpm.  LVH.   - Personally Reviewed  Physical Exam   VS:  BP 98/78 (BP Location: Right Arm)   Pulse (!) 109   Temp 98.3 F (36.8 C) (Oral)   Resp 18   Ht 6' (1.829 m)   Wt 80.3 kg   SpO2 99%   BMI 24.01 kg/m  , BMI Body mass index is 24.01 kg/m. GENERAL:  Well appearing HEENT: Pupils equal round and reactive, fundi not visualized, oral mucosa unremarkable NECK:  No jugular venous  distention, waveform within normal limits, carotid upstroke brisk and symmetric, no bruits LUNGS:  Clear to auscultation bilaterally HEART:  Tachycardic.  Regular rhythm.  PMI not displaced or sustained,S1 and S2 within normal limits, no S3, no S4, no clicks, no rubs, no murmurs ABD:  Flat, positive bowel sounds normal in frequency in pitch, no bruits, no rebound, no guarding, no midline pulsatile mass, no hepatomegaly, no splenomegaly EXT:  2 plus pulses throughout, +L LE edema, no cyanosis no clubbing SKIN:  No rashes no nodules NEURO:  Cranial nerves II through XII grossly intact, motor grossly intact throughout PSYCH:  Cognitively intact, oriented to person place and time   Labs    High Sensitivity Troponin:   Recent Labs  Lab 05/22/19 1603  TROPONINIHS 13      Chemistry Recent Labs  Lab 05/22/19 1603 05/23/19 0418  NA 135 135  K 4.2 3.7  CL 98 98  CO2 23 23  GLUCOSE 100* 89  BUN 15 15  CREATININE 1.15 1.20  CALCIUM 8.5* 8.0*  PROT  --  4.6*  ALBUMIN  --  2.0*  AST  --  29  ALT  --  23  ALKPHOS  --  66  BILITOT  --  0.6  GFRNONAA >60 >60  GFRAA >60 >60  ANIONGAP 14 14     Hematology Recent Labs  Lab 05/22/19 1603 05/23/19 0418 05/24/19 0419  WBC 18.2* 13.6* 11.5*  RBC 4.31 3.82* 3.85*  HGB 10.9* 9.7* 9.6*  HCT 36.4* 32.0* 32.2*  MCV 84.5 83.8 83.6  MCH 25.3* 25.4* 24.9*  MCHC 29.9* 30.3 29.8*  RDW 17.9* 18.0* 17.8*  PLT 426* 308 282    BNPNo results for input(s): BNP, PROBNP in the last 168 hours.   DDimer No results for input(s): DDIMER in the last 168 hours.   Radiology    CT Abdomen Pelvis W Contrast  Result Date: 05/22/2019 CLINICAL DATA:  Left groin adenopathy and left lower extremity swelling. EXAM: CT ABDOMEN AND PELVIS WITH CONTRAST TECHNIQUE: Multidetector CT imaging of the abdomen and pelvis was performed using the standard protocol following bolus administration of intravenous contrast. CONTRAST:  175mL OMNIPAQUE IOHEXOL 300 MG/ML   SOLN COMPARISON:  CT scan 05/20/2004 FINDINGS: Lower chest: Moderate-sized bilateral pleural effusions with overlying atelectasis. Coronary artery calcifications are noted. Hepatobiliary: No focal hepatic lesions or intrahepatic biliary dilatation. Small calcified gallstones are noted the gallbladder. No intra or extrahepatic biliary dilatation. Pancreas: No mass or inflammation. No ductal dilatation. Spleen: Spleen is mildly enlarged and there are splenic lesions. Adrenals/Urinary Tract: The adrenal glands are unremarkable. The right kidney is infiltrated with tumor. 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 The right renal artery is patent but the right renal vein appears to be at least partially occluded. The left kidney is unremarkable. The bladder is grossly normal. Stomach/Bowel: The stomach, duodenum, small bowel and colon are grossly normal without oral contrast. No acute inflammatory changes, mass lesions or obstructive findings. The terminal ileum is normal. The appendix is normal. Vascular/Lymphatic: Moderate age advanced atherosclerotic calcifications involving the aorta iliac arteries. The major venous structures are patent. The right renal vein appears to be at least partially occluded. The portal and splenic veins are patent. Extensive retroperitoneal lymphadenopathy up lifting the aorta. Nodal mass on image 27/3 measures 5.8 x 4.2 cm. Bulk E pelvic adenopathy with cystic/degenerative type changes involving the left obturator lymph nodes. There is also extensive left inguinal adenopathy measuring approximately 8.7 x 7.2 cm. There is also right inguinal adenopathy. Reproductive: The prostate gland and seminal vesicles are unremarkable. Other: Mild presacral edema and small amount of free pelvic fluid. Musculoskeletal: Findings suspicious for infiltrating bone disease mainly involving the right iliac bone and the left pubic bone. IMPRESSION: 1. 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 Extensive retroperitoneal, pelvic and inguinal lymphadenopathy along with involvement of the spleen and near complete infiltration of the right kidney. 2. Probable occlusions/infiltration of the right renal vein. 3. Findings suspicious for infiltrating bone disease mainly involving the right iliac bone and the left pubic bone. 4. Moderate-sized bilateral pleural effusions with overlying atelectasis. 5. Cholelithiasis. 6. Age advanced atherosclerotic calcifications involving the aorta and iliac arteries. Aortic Atherosclerosis (ICD10-I70.0). Electronically Signed   By: Marijo Sanes M.D.   On: 05/22/2019 21:01   DG Chest Portable 1 View  Result Date: 05/22/2019 CLINICAL DATA:  Atrial fibrillation, short of breath, lower extremity edema EXAM: PORTABLE CHEST 1 VIEW COMPARISON:  03/27/2019 FINDINGS: Single frontal view of the chest demonstrates external defibrillator  pad overlying left hilum. Cardiac silhouette is enlarged but stable. Bibasilar veiling opacities, right greater than left, consistent with airspace disease and effusions. No pneumothorax. IMPRESSION: 1. Findings consistent with pulmonary edema, with bibasilar airspace disease and effusions. Electronically Signed   By: Randa Ngo M.D.   On: 05/22/2019 17:50   DG Hip Port Unilat W or Wo Pelvis 1 View Left  Result Date: 05/22/2019 CLINICAL DATA:  AFib, shortness of breath EXAM: DG HIP (WITH OR WITHOUT PELVIS) 1V PORT LEFT COMPARISON:  None. FINDINGS: No displaced fracture or dislocation of the left hip or included pelvis. There is moderate joint space narrowing and osteophytosis. Nonobstructive pattern of overlying bowel gas. IMPRESSION: No displaced fracture or dislocation of the left hip. There is moderate joint space narrowing and osteophytosis. Electronically Signed   By: Eddie Candle M.D.   On: 05/22/2019 17:51   VAS [place] LOWER EXTREMITY VENOUS (DVT) (MC and WL 7a-7p)  Result Date: 05/23/2019  Lower Venous DVTStudy Indications: Pain,  Swelling, and extreme unilateral swelling.  Limitations: Poor ultrasound/tissue interface. Performing Technologist: Antonieta Pert RDMS, RVT  Examination Guidelines: A complete evaluation includes B-mode imaging, spectral Doppler, color Doppler, and power Doppler as needed of all accessible portions of each vessel. Bilateral testing is considered an integral part of a complete examination. Limited examinations for reoccurring indications may be performed as noted. The reflux portion of the exam is performed with the patient in reverse Trendelenburg.  +-----+---------------+---------+-----------+----------+--------------+ RIGHTCompressibilityPhasicitySpontaneityPropertiesThrombus Aging +-----+---------------+---------+-----------+----------+--------------+ CFV  Full           Yes      Yes                                 +-----+---------------+---------+-----------+----------+--------------+   +---------+---------------+---------+-----------+----------+-------------------+ LEFT     CompressibilityPhasicitySpontaneityPropertiesThrombus Aging      +---------+---------------+---------+-----------+----------+-------------------+ CFV                     Yes      Yes                  unable to compress                                                        due to adjecent                                                           enlarged lymph                                                            nodes               +---------+---------------+---------+-----------+----------+-------------------+ SFJ                     Yes      Yes                                      +---------+---------------+---------+-----------+----------+-------------------+  FV Prox  Full                                                             +---------+---------------+---------+-----------+----------+-------------------+ FV Mid   Full                                                              +---------+---------------+---------+-----------+----------+-------------------+ FV DistalFull                                                             +---------+---------------+---------+-----------+----------+-------------------+ PFV      Full                                                             +---------+---------------+---------+-----------+----------+-------------------+ POP      Full           Yes      Yes                                      +---------+---------------+---------+-----------+----------+-------------------+ PTV      Full                                                             +---------+---------------+---------+-----------+----------+-------------------+ PERO     Full                                                             +---------+---------------+---------+-----------+----------+-------------------+ GSV                     Yes      Yes                                      +---------+---------------+---------+-----------+----------+-------------------+     Summary: RIGHT: - No evidence of deep vein thrombosis in the lower extremity. No indirect evidence of obstruction proximal to the inguinal ligament. - prominent lymph nodes noted - Ultrasound characteristics of enlarged lymph nodes are noted in the groin.  LEFT: - There is no evidence of deep vein thrombosis in the lower extremity. However, portions of this examination were limited- see technologist comments above.  - multiple enlarged  lymph nodes in left groin, largest measuring 5cm. - Ultrasound characteristics of enlarged lymph nodes noted in the groin.  *See table(s) above for measurements and observations. Electronically signed by Deitra Mayo MD on 05/23/2019 at 11:03:50 AM.    Final     Cardiac Studies   Echo 05/14/19:  1. Compared to echo in March 2021, no significant change in LVEF or MR.  Pt is now in atrial fibrillation.  2. LVEF  is severely depressed with severe hypokinesis/akinesis of the  base/mid inferior, inferosepal, infer walls and apical walls; hypokinesis  elsewhere . Left ventricular ejection fraction, by estimation, is 30%. The  left ventricle has severely  decreased function. The left ventricular internal cavity size was mildly  dilated. There is mild left ventricular hypertrophy. Left ventricular  diastolic parameters are indeterminate.  3. Right ventricular systolic function is normal. The right ventricular  size is normal. There is normal pulmonary artery systolic pressure.  4. Left atrial size was mildly dilated.  5. MR is directed posteriorly into LA, likely due to tethering of  posterior leaflet. Severe mitral valve regurgitation.  6. The aortic valve is tricuspid. Aortic valve regurgitation is not  visualized. Mild aortic valve sclerosis is present, with no evidence of  aortic valve stenosis.  7. The inferior vena cava is normal in size with greater than 50%  respiratory variability, suggesting right atrial pressure of 3 mmHg.   Patient Profile     64 y.o. male with CAD status post recent PCI (03/2019 left circumflex and RCA), COPD, chronic systolic and diastolic heart failure (LVEF 30 to 35%) moderate to severe mitral regurgitation, and paroxysmal atrial fibrillation here with left lower extremity edema.  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  Assessment & Plan    # Paroxysmal atrial flutter: Patient was asymptomatic on admission.  He was noted to be in atrial flutter with rapid ventricular response.  He underwent cardioversion in the ED and is now maintaining sinus rhythm.  Continue carvedilol.  His Xarelto is on hold and he is currently on heparin.  Resume when able.  #CAD: # Hyperlipidemia:  Mr. Crumpley has extensive stenting of the left circumflex into OM 3.  He also has an LAD stent.  These were both done just  2 months ago.  Continue ticagrelor, rosuvastatin, and beta-blocker.  # Severe MR: # Chronic systolic and diastolic heart failure:  Volume status stable.  LLE edema likely obstructive.  # Transaminitis: Resolved.  Unclear if it was due to his acute MI or alcohol abuse.  Currently denies alcohol use.  # Lymphoma: CT scan this admission was consistent with a high-grade/aggressive lymphoma with extensive retroperitoneal, pelvic, and inguinal lymphadenopathy.  There is also involvement of the spleen and near complete infiltration of the right kidney.  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  Suspicious for infiltrating disease of the right iliac and left pubic bones.  S/p biopsy  [facility] will sign off.   Medication Recommendations:  Resume Xarelto when stable post procedure Other recommendations (labs, testing, etc):  none Follow up as an outpatient:  We will arrange         For questions or updates, please contact [place] Please consult [email or web address] for contact info under        Signed, Skeet Latch, MD  05/24/2019, 12:29 PM

## 2019-05-24 NOTE — Progress Notes (Signed)
Initial Nutrition Assessment  DOCUMENTATION CODES:   Not applicable  INTERVENTION:   -Ensure Enlive po BID, each supplement provides 350 kcal and 20 grams of protein -Magic cup TID with meals, each supplement provides 290 kcal and 9 grams of protein -MVI with minerals daily  NUTRITION DIAGNOSIS:   Increased nutrient needs related to cancer and cancer related treatments as evidenced by estimated needs.  GOAL:   Patient will meet greater than or equal to 90% of their needs  MONITOR:   PO intake, Supplement acceptance, Labs, Weight trends, Skin, I & O's  REASON FOR ASSESSMENT:   Malnutrition Screening Tool    ASSESSMENT:   Tony Saunders is a 64 y.o. male with history of CAD status post recent acute MI status post stenting, ischemic cardiomyopathy last EF measured was 30% recently atrial fibrillation seizure disorder severe mitral regurgitation and recently found to have elevated LFTs presents to the ER because of worsening swelling of the left lower extremity over the last couple of days.  Patient states he was having some pain around the left lower extremity over the last few days the swelling suddenly appeared and started worsening over the last couple of days.  Denies any fever chills.  No chest pain or shortness of breath.  Patient states due to the swelling he has found it difficult to walk.  No muscle weakness.  No incontinence of urine or bowel.  Pt admitted with a-fib with RVR s/p cardioversion and intra-abdomina lymphadenopathy (concern for aggressive lymphoma).   Reviewed I/O's: +1.1 L x 24 hours and +1.5 L since admission  UOP: 200 ml x 24 hours  Pt receiving nursing care at time of visit. Also attempted to see pt earlier this AM, however, downstairs for lymph node biopsy. Unable to obtain further nutrition-related history or perform nutrition-focused physical exam at this time.   Pt advanced to regular diet s/p biopsy. Pt with fair intake. Noted meal completion  documented at 45%.  Reviewed wt hx; noted pt has experienced a 8.3# wt loss over the past 2 months. Unsure of accuracy of weight, which have fluctuated since admission (85 kg since 80 kg).   Pt at high risk for malnutrition, however, unable to identify at this time. Pt would greatly benefit from addition of oral nutrition supplements.   Labs reviewed.   Diet Order:   Diet Order            Diet regular Room service appropriate? Yes; Fluid consistency: Thin  Diet effective now              EDUCATION NEEDS:   No education needs have been identified at this time  Skin:  Skin Assessment: Reviewed RN Assessment  Last BM:  05/22/19  Height:   Ht Readings from Last 1 Encounters:  05/22/19 6' (1.829 m)    Weight:   Wt Readings from Last 1 Encounters:  05/24/19 80.3 kg    Ideal Body Weight:  80.9 kg  BMI:  Body mass index is 24.01 kg/m.  Estimated Nutritional Needs:   Kcal:  2200-2400  Protein:  120-135 grams  Fluid:  > 2.2 L    Loistine Chance, RD, LDN, [place] Registered Dietitian II Certified Diabetes Care and Education Specialist Please refer to [facility] for RD and/or RD on-call/weekend/after hours pager

## 2019-05-24 NOTE — Progress Notes (Signed)
Patient is off floor having biopsy procedure done this time noted he triggered Mews while off the floor due to Pulse rate and resp rate will defer to other procedural area.

## 2019-05-24 NOTE — Progress Notes (Signed)
   05/24/19 0023  Assess: MEWS Score  Temp 98.5 F (36.9 C)  BP 95/65  Pulse Rate (!) 102  Resp 20  SpO2 96 %  O2 Device Room Air  Assess: MEWS Score  MEWS Temp 0  MEWS Systolic 1  MEWS Pulse 1  MEWS RR 0  MEWS LOC 0  MEWS Score 2  MEWS Score Color Yellow  Assess: if the MEWS score is Yellow or Red  Were vital signs taken at a resting state? Yes  Focused Assessment Documented focused assessment  Early Detection of Sepsis Score *See Row Information* Medium  MEWS guidelines implemented *See Row Information* No, previously yellow, continue vital signs every 4 hours  Notify: Charge Nurse/RN  Name of Charge Nurse/RN Notified Chais Fehringer  Date Charge Nurse/RN Notified 05/24/19  Time Charge Nurse/RN Notified 0023  Notify: Provider  Provider Name/Title opyd md  Date Provider Notified 05/24/19  Time Provider Notified [phone]  Notification Type Page  Notification Reason Other (Comment)  Response No new orders  Document  Progress note created (see row info) Yes    Pt BP chronically low. HR has been in the low 100s since admission. MD paged and no new orders given.

## 2019-05-24 NOTE — Progress Notes (Signed)
[place] for Heparin Indication: atrial fibrillation  No Known Allergies  Patient Measurements: Height: 6' (182.9 cm) Weight: 80.3 kg (177 lb 0.5 oz) IBW/kg (Calculated) : 77.6  Vital Signs: Temp: 98.4 F (36.9 C) (05/06 0433) Temp Source: Oral (05/06 0433) BP: 105/58 (05/06 0433) Pulse Rate: 103 (05/06 0433)  Labs: Recent Labs    05/22/19 1603 05/22/19 1603 05/23/19 0418 05/24/19 0419  HGB 10.9*   < > 9.7* 9.6*  HCT 36.4*  --  32.0* 32.2*  PLT 426*  --  308 282  APTT  --   --  37* 88*  HEPARINUNFRC  --   --  >2.20*  --   CREATININE 1.15  --  1.20  --   TROPONINIHS 13  --   --   --    < > = values in this interval not displayed.    Estimated Creatinine Clearance: 70.1 mL/min (by C-G formula based on SCr of 1.2 mg/dL).   Medical History: Past Medical History:  Diagnosis Date  . CHF (congestive heart failure) ([place])   . Coronary artery disease   . Myocardial infarction ([place])   . Seizures ([place])    most recent today, 07/23/15    Assessment: 64yo male c/o LLE swelling, no evidence of DVT on Doppler, found to be in Afib/flutter w/ RVR >> cardioverted in ED, to transition from Xarelto to heparin during admission; last dose of Xarelto given 5/4 2139 in ED.  5/6 AM update:  APTT therapeutic   Goal of Therapy:  Heparin level 0.3-0.7 units/ml aPTT 66-102 seconds Monitor platelets by anticoagulation protocol: Yes   Plan:  Cont heparin at 1600 units/hr Confirmatory aPTT at [place], PharmD, [place] Pharmacist Phone: [phone]

## 2019-05-24 NOTE — Progress Notes (Signed)
   05/24/19 1711  Assess: MEWS Score  Temp 97.9 F (36.6 C)  BP 114/85  Pulse Rate (!) 120  Resp 20  SpO2 97 %  O2 Device Room Air  Assess: MEWS Score  MEWS Temp 0  MEWS Systolic 0  MEWS Pulse 2  MEWS RR 0  MEWS LOC 0  MEWS Score 2  MEWS Score Color Yellow  Assess: if the MEWS score is Yellow or Red  Were vital signs taken at a resting state? Yes  Early Detection of Sepsis Score *See Row Information* Low  MEWS guidelines implemented *See Row Information* No, other (Comment)  Treat  MEWS Interventions Other (Comment) (chronic tachycardia)  Take Vital Signs  Increase Vital Sign Frequency  Yellow: Q 2hr X 2 then Q 4hr X 2, if remains yellow, continue Q 4hrs  Escalate  MEWS: Escalate Yellow: discuss with charge nurse/RN and consider discussing with provider and RRT  Notify: Charge Nurse/RN  Name of Charge Nurse/RN Notified Eun,Rn. (no further change in treatment plan.)  Date Charge Nurse/RN Notified 05/24/19  Time Charge Nurse/RN Notified 58  Notify: Provider  Provider Name/Title Dr.Matthews,E   Date Provider Notified 05/24/19  Time Provider Notified 1711  Notification Type Call  Notification Reason Other (Comment) (yellow mews triggered due tachycardia)  Response No new orders  Date of Provider Response 05/24/19  Time of Provider Response 1711  Notify: Rapid Response  Name of Rapid Response RN Notified  (not done chronic condition MD aware.)  Document  Patient Outcome Other (Comment) (continue current treatment plan.)  Progress note created (see row info) Yes  No new orders noted from MD continue current treatment plan.

## 2019-05-24 NOTE — Progress Notes (Addendum)
[place] for Heparin >> Rivaroxaban Indication: atrial fibrillation  No Known Allergies  Patient Measurements: Height: 6' (182.9 cm) Weight: 80.3 kg (177 lb 0.5 oz) IBW/kg (Calculated) : 77.6  Vital Signs: Temp: 97.9 F (36.6 C) (05/06 1711) Temp Source: Oral (05/06 1711) BP: 114/85 (05/06 1711) Pulse Rate: 120 (05/06 1711)  Labs: Recent Labs    05/22/19 1603 05/22/19 1603 05/23/19 0418 05/24/19 0419  HGB 10.9*   < > 9.7* 9.6*  HCT 36.4*  --  32.0* 32.2*  PLT 426*  --  308 282  APTT  --   --  37* 88*  HEPARINUNFRC  --   --  >2.20*  --   CREATININE 1.15  --  1.20  --   TROPONINIHS 13  --   --   --    < > = values in this interval not displayed.    Estimated Creatinine Clearance: 70.1 mL/min (by C-G formula based on SCr of 1.2 mg/dL).   Medical History: Past Medical History:  Diagnosis Date  . CHF (congestive heart failure) ([place])   . Coronary artery disease   . Myocardial infarction ([place])   . Seizures ([place])    most recent today, 07/23/15    Assessment: 64 yr old male on Xarelto PTA for hx AFib was admitted with Afib with RVR. Pt was started on IV heparin, with need for biopsy.  Heparin was held this am for procedure; per IR ok to resume 3h post (~1400). IV heparin was resumed at 1600 units/hr at ~1600 this afternoon. Pharmacy is now consulted to transition pt from IV heparin back to Xarelto (per Dr. Laurence Ferrari of IR, okay to start Xarelto this evening). H/H 9.6/32.2, platelets 282, TBW CrCl ~72 ml/min. Per RN, no bleeding issues observed after the biopsy.  Goal of Therapy:  Heparin level 0.3-0.7 units/ml aPTT 66-102 seconds Monitor platelets by anticoagulation protocol: Yes   Plan:  Stop heparin infusion this evening, and start Xarelto 20 mg po daily (with supper) at the same time Monitor CBC Monitor for signs/symptoms of bleeding  Gillermina Hu, PharmD, BCPS, [facility] Clinical Pharmacist 05/24/2019

## 2019-05-24 NOTE — Progress Notes (Signed)
Patient with heart rate chronic tachycardia MD reports heart rate much improved then from when he arrived heart rate prior was 150-170's no new orders noted as patient is stable and Mews did not remain yellow.   05/24/19 1143  Assess: MEWS Score  Temp 98.3 F (36.8 C)  BP 98/78  Pulse Rate (!) 109  Resp 18  SpO2 99 %  O2 Device Room Air  Assess: MEWS Score  MEWS Temp 0  MEWS Systolic 1  MEWS Pulse 1  MEWS RR 0  MEWS LOC 0  MEWS Score 2  MEWS Score Color Yellow  Assess: if the MEWS score is Yellow or Red  Were vital signs taken at a resting state? Yes  Focused Assessment  (no chronic tachycardia MD aware.)  Early Detection of Sepsis Score *See Row Information* Low  MEWS guidelines implemented *See Row Information* No, other (Comment)  Treat  MEWS Interventions  (chronic tachycardia per MD hr much improved)  Take Vital Signs  Increase Vital Sign Frequency   (has not remained yellow not implemented.)  Escalate  MEWS: Escalate Yellow: discuss with charge nurse/RN and consider discussing with provider and RRT  Notify: Charge Nurse/RN  Name of Charge Nurse/RN Notified Eun  Date Charge Nurse/RN Notified 05/24/19  Time Charge Nurse/RN Notified 1200  Notify: Provider  Provider Name/Title Matthews,E MD  Date Provider Notified 05/24/19  Time Provider Notified 1200  Notification Type Page  Notification Reason Other (Comment) (patient heart rate chronic tachycardia)  Response No new orders  Date of Provider Response 05/24/19  Time of Provider Response 1200  Notify: Rapid Response  Name of Rapid Response RN Notified  (not done patient with chronic tachycardia.)  Document  Patient Outcome Other (Comment) (stable with tachycardia)  Progress note created (see row info)  (Patient admitted with Heart rate 150-170 MD states improved)

## 2019-05-24 NOTE — Progress Notes (Signed)
Patient back to room biopsy has been done await results.

## 2019-05-24 NOTE — Progress Notes (Addendum)
PROGRESS NOTE    Tony Saunders  [id number] DOB: Nov 13, 1955 DOA: 05/22/2019 PCP: [name], [name], MD   Chief Complaint  Patient presents with  . Tachycardia  . Leg Swelling    Brief Narrative: HPI per Dr. Hal Hope on 05/23/2019.   Tony Saunders is a 64 y.o. male with history of CAD status post recent acute MI status post stenting, ischemic cardiomyopathy last EF measured was 30% recently atrial fibrillation seizure disorder severe mitral regurgitation and recently found to have elevated LFTs presents to the ER because of worsening swelling of the left lower extremity over the last couple of days.  Patient states he was having some pain around the left lower extremity over the last few days the swelling suddenly appeared and started worsening over the last couple of days.  Denies any fever chills.  No chest pain or shortness of breath.  Patient states due to the swelling he has found it difficult to walk.  No muscle weakness.  No incontinence of urine or bowel.  ED Course: In the ER patient is found to be in atrial flutter with RVR and cardiology was consulted and patient had cardioversion done.  Since patient had left lower extremity swelling patient underwent Dopplers of the lower extremity which was negative for DVT but did show lymphadenopathy concerning.  CT abdomen pelvis was done which shows preoperative high-grade lymphoma and patient is admitted for further management.  Patient lab work showed leukocytosis of 18.2 with chest pressure and pulmonary when possible infiltrates.  High-sensitivity troponin was 13.  CBC shows hemoglobin of 10.9 dated 4/26 Covid test was negative.  05/24/2019-patient was seen by cardiology and oncology and is now status post inguinal lymph node biopsy on 05/24/2019.  Assessment & Plan:   Principal Problem:   Lymphadenopathy Active Problems:   Temporal lobe epilepsy (HCC)   CAD- S/P PCI   Moderate to severe mitral regurgitation   PAF (paroxysmal  atrial fibrillation) (HCC)   Ischemic cardiomyopathy   Atrial flutter with rapid ventricular response (HCC)   Atrial fibrillation with RVR (HCC)    #1 A. fib RVR status post cardioversion in ER.  he has been started on heparin and Xarelto being held for the biopsy.?  Restart Xarelto after the biopsy Blood pressure soft monitor closely on Coreg  #2? 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 Extensive retroperitoneal, pelvic and inguinal lymphadenopathy along with involvement of the spleen and near complete infiltration of the right kidney.  Probable occlusions/infiltration of the right renal vein. Findings suspicious for infiltrating bone disease mainly involving the right iliac bone and the left pubic bone. Moderate-sized bilateral pleural effusions with overlying atelectasis Cholelithiasis.  Status post inguinal lymph node biopsy 05/24/2019.  #3 left lower extremity swelling likely from intra-abdominal lymphadenopathy causing obstruction.  #4 history of CAD status post recent stenting and recent MI on Brilinta, Coreg and statins  #5 ischemic cardiomyopathy ejection fraction 30% echo on 05/14/2019.  On Coreg  #6 community-acquired pneumonia patient with productive cough shortness of breath with tachycardia leukocytosis and chest x-ray consistent with pneumonia.  #7 chronic anemia hemoglobin 9.6 stable  #8 history of epilepsy on Lamictal and Keppra  #9 severe mitral regurgitation follow-up with cardiology as an outpatient for further work-up.   DVT prophylaxis: IV heparin  code Status: Full code Family Communication: Discussed with patient Disposition: Inpatient status.  Patient being worked up for [place].  He has widespread intra-abdominal lymphadenopathy consistent with likely lymphoma which is new status post  biopsy of the lymph node 05/24/2019.  Followed by cardiology for A. fib RVR and oncology awaiting biopsy  results.  Dispo: The patient is from: Home              Anticipated d/c is to: Unknown              Anticipated d/c date is: More than 3 days              Patient currently is not medically stable to d/c.   Consultants:   Cardiology and oncology  Procedures: inguinal  lymph node biopsy 05/24/2019 Antimicrobials: none  Subjective:  Patient resting in bed anxious to drink something has been n.p.o. for the biopsy Objective: Vitals:   05/24/19 0955 05/24/19 1005 05/24/19 1028 05/24/19 1056  BP: 110/72 111/68 112/69 108/74  Pulse: (!) 107 (!) 110 (!) 109 (!) 108  Resp: (!) 26 (!) 24 18 16   Temp:   98.2 F (36.8 C) 98.5 F (36.9 C)  TempSrc:   Oral Oral  SpO2: 100% 96% 97% 99%  Weight:      Height:        Intake/Output Summary (Last 24 hours) at 05/24/2019 1115 Last data filed at 05/23/2019 2034 Gross per 24 hour  Intake 1340 ml  Output 200 ml  Net 1140 ml   Filed Weights   05/22/19 1645 05/24/19 0325  Weight: 85 kg 80.3 kg    Examination:  General exam: Appears calm and comfortable  Respiratory system: Clear to auscultation. Respiratory effort normal. Cardiovascular system: S1 & S2 heard, RRR. No JVD, murmurs, rubs, gallops or clicks. No pedal edema. Gastrointestinal system: Abdomen is nondistended, soft and nontender. No organomegaly or masses felt. Normal bowel sounds heard. Central nervous system: Alert and oriented. No focal neurological deficits. Extremities: Symmetric 5 x 5 power. Skin: No rashes, lesions or ulcers Psychiatry: Judgement and insight appear normal. Mood & affect appropriate.     Data Reviewed: I have personally reviewed following labs and imaging studies  CBC: Recent Labs  Lab 05/22/19 1603 05/23/19 0418 05/24/19 0419  WBC 18.2* 13.6* 11.5*  HGB 10.9* 9.7* 9.6*  HCT 36.4* 32.0* 32.2*  MCV 84.5 83.8 83.6  PLT 426* 308 [id number]    Basic Metabolic Panel: Recent Labs  Lab 05/22/19 1603 05/23/19 0418  NA 135 135  K 4.2 3.7  CL 98 98  CO2  23 23  GLUCOSE 100* 89  BUN 15 15  CREATININE 1.15 1.20  CALCIUM 8.5* 8.0*    GFR: Estimated Creatinine Clearance: 70.1 mL/min (by C-G formula based on SCr of 1.2 mg/dL).  Liver Function Tests: Recent Labs  Lab 05/23/19 0418  AST 29  ALT 23  ALKPHOS 66  BILITOT 0.6  PROT 4.6*  ALBUMIN 2.0*    CBG: No results for input(s): GLUCAP in the last 168 hours.   Recent Results (from the past 240 hour(s))  Respiratory Panel by RT PCR (Flu A&B, Covid) - Nasopharyngeal Swab     Status: None   Collection Time: 05/22/19  4:51 PM   Specimen: Nasopharyngeal Swab  Result Value Ref Range Status   SARS Coronavirus 2 by RT PCR NEGATIVE NEGATIVE Final    Comment: (NOTE) SARS-CoV-2 target nucleic acids are NOT DETECTED. The SARS-CoV-2 RNA is generally detectable in upper respiratoy specimens during the acute phase of infection. The lowest concentration of SARS-CoV-2 viral copies this assay can detect is 131 copies/mL. A negative result does not preclude SARS-Cov-2 infection and should not be used  as the sole basis for treatment or other patient management decisions. A negative result may occur with  improper specimen collection/handling, submission of specimen other than nasopharyngeal swab, presence of viral mutation(s) within the areas targeted by this assay, and inadequate number of viral copies (<131 copies/mL). A negative result must be combined with clinical observations, patient history, and epidemiological information. The expected result is Negative. Fact Sheet for Patients:  PinkCheek.be Fact Sheet for Healthcare Providers:  GravelBags.it This test is not yet ap proved or cleared by the [place] FDA and  has been authorized for detection and/or diagnosis of SARS-CoV-2 by FDA under an Emergency Use Authorization (EUA). This EUA will remain  in effect (meaning this test can be used) for the duration of the COVID-19  declaration under Section 564(b)(1) of the Act, 21 U.S.C. section 360bbb-3(b)(1), unless the authorization is terminated or revoked sooner.    Influenza A by PCR NEGATIVE NEGATIVE Final   Influenza B by PCR NEGATIVE NEGATIVE Final    Comment: (NOTE) The Xpert Xpress SARS-CoV-2/FLU/RSV assay is intended as an aid in  the diagnosis of influenza from Nasopharyngeal swab specimens and  should not be used as a sole basis for treatment. Nasal washings and  aspirates are unacceptable for Xpert Xpress SARS-CoV-2/FLU/RSV  testing. Fact Sheet for Patients: PinkCheek.be Fact Sheet for Healthcare Providers: GravelBags.it This test is not yet approved or cleared by the [place] FDA and  has been authorized for detection and/or diagnosis of SARS-CoV-2 by  FDA under an Emergency Use Authorization (EUA). This EUA will remain  in effect (meaning this test can be used) for the duration of the  Covid-19 declaration under Section 564(b)(1) of the Act, 21  U.S.C. section 360bbb-3(b)(1), unless the authorization is  terminated or revoked. Performed at [facility], [place] [address], [place],  [address]          Radiology Studies: CT Abdomen Pelvis W Contrast  Result Date: 05/22/2019 CLINICAL DATA:  Left groin adenopathy and left lower extremity swelling. EXAM: CT ABDOMEN AND PELVIS WITH CONTRAST TECHNIQUE: Multidetector CT imaging of the abdomen and pelvis was performed using the standard protocol following bolus administration of intravenous contrast. CONTRAST:  15mL OMNIPAQUE IOHEXOL 300 MG/ML  SOLN COMPARISON:  CT scan 05/20/2004 FINDINGS: Lower chest: Moderate-sized bilateral pleural effusions with overlying atelectasis. Coronary artery calcifications are noted. Hepatobiliary: No focal hepatic lesions or intrahepatic biliary dilatation. Small calcified gallstones are noted the gallbladder. No intra or extrahepatic biliary  dilatation. Pancreas: No mass or inflammation. No ductal dilatation. Spleen: Spleen is mildly enlarged and there are splenic lesions. Adrenals/Urinary Tract: The adrenal glands are unremarkable. The right kidney is infiltrated with tumor. 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 The right renal artery is patent but the right renal vein appears to be at least partially occluded. The left kidney is unremarkable. The bladder is grossly normal. Stomach/Bowel: The stomach, duodenum, small bowel and colon are grossly normal without oral contrast. No acute inflammatory changes, mass lesions or obstructive findings. The terminal ileum is normal. The appendix is normal. Vascular/Lymphatic: Moderate age advanced atherosclerotic calcifications involving the aorta iliac arteries. The major venous structures are patent. The right renal vein appears to be at least partially occluded. The portal and splenic veins are patent. Extensive retroperitoneal lymphadenopathy up lifting the aorta. Nodal mass on image 27/3 measures 5.8 x 4.2 cm. Bulk E pelvic adenopathy with cystic/degenerative type changes involving the left obturator lymph nodes. There is also extensive left inguinal adenopathy  measuring approximately 8.7 x 7.2 cm. There is also right inguinal adenopathy. Reproductive: The prostate gland and seminal vesicles are unremarkable. Other: Mild presacral edema and small amount of free pelvic fluid. Musculoskeletal: Findings suspicious for infiltrating bone disease mainly involving the right iliac bone and the left pubic bone. IMPRESSION: 1. 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 Extensive retroperitoneal, pelvic and inguinal lymphadenopathy along with involvement of the spleen and near complete infiltration of the right kidney. 2. Probable occlusions/infiltration of the right renal vein. 3. Findings suspicious for infiltrating bone disease mainly involving the right iliac bone and the left pubic bone. 4.  Moderate-sized bilateral pleural effusions with overlying atelectasis. 5. Cholelithiasis. 6. Age advanced atherosclerotic calcifications involving the aorta and iliac arteries. Aortic Atherosclerosis (ICD10-I70.0). Electronically Signed   By: Marijo Sanes M.D.   On: 05/22/2019 21:01   DG Chest Portable 1 View  Result Date: 05/22/2019 CLINICAL DATA:  Atrial fibrillation, short of breath, lower extremity edema EXAM: PORTABLE CHEST 1 VIEW COMPARISON:  03/27/2019 FINDINGS: Single frontal view of the chest demonstrates external defibrillator pad overlying left hilum. Cardiac silhouette is enlarged but stable. Bibasilar veiling opacities, right greater than left, consistent with airspace disease and effusions. No pneumothorax. IMPRESSION: 1. Findings consistent with pulmonary edema, with bibasilar airspace disease and effusions. Electronically Signed   By: Randa Ngo M.D.   On: 05/22/2019 17:50   DG Hip Port Unilat W or Wo Pelvis 1 View Left  Result Date: 05/22/2019 CLINICAL DATA:  AFib, shortness of breath EXAM: DG HIP (WITH OR WITHOUT PELVIS) 1V PORT LEFT COMPARISON:  None. FINDINGS: No displaced fracture or dislocation of the left hip or included pelvis. There is moderate joint space narrowing and osteophytosis. Nonobstructive pattern of overlying bowel gas. IMPRESSION: No displaced fracture or dislocation of the left hip. There is moderate joint space narrowing and osteophytosis. Electronically Signed   By: Eddie Candle M.D.   On: 05/22/2019 17:51   VAS [place] LOWER EXTREMITY VENOUS (DVT) ([facility] and [facility] 7a-7p)  Result Date: 05/23/2019  Lower Venous DVTStudy Indications: Pain, Swelling, and extreme unilateral swelling.  Limitations: Poor ultrasound/tissue interface. Performing Technologist: Antonieta Pert RDMS, RVT  Examination Guidelines: A complete evaluation includes B-mode imaging, spectral Doppler, color Doppler, and power Doppler as needed of all accessible portions of each vessel. Bilateral testing is  considered an integral part of a complete examination. Limited examinations for reoccurring indications may be performed as noted. The reflux portion of the exam is performed with the patient in reverse Trendelenburg.  +-----+---------------+---------+-----------+----------+--------------+ RIGHTCompressibilityPhasicitySpontaneityPropertiesThrombus Aging +-----+---------------+---------+-----------+----------+--------------+ CFV  Full           Yes      Yes                                 +-----+---------------+---------+-----------+----------+--------------+   +---------+---------------+---------+-----------+----------+-------------------+ LEFT     CompressibilityPhasicitySpontaneityPropertiesThrombus Aging      +---------+---------------+---------+-----------+----------+-------------------+ CFV                     Yes      Yes                  unable to compress  due to adjecent                                                           enlarged lymph                                                            nodes               +---------+---------------+---------+-----------+----------+-------------------+ SFJ                     Yes      Yes                                      +---------+---------------+---------+-----------+----------+-------------------+ FV Prox  Full                                                             +---------+---------------+---------+-----------+----------+-------------------+ FV Mid   Full                                                             +---------+---------------+---------+-----------+----------+-------------------+ FV DistalFull                                                             +---------+---------------+---------+-----------+----------+-------------------+ PFV      Full                                                              +---------+---------------+---------+-----------+----------+-------------------+ POP      Full           Yes      Yes                                      +---------+---------------+---------+-----------+----------+-------------------+ PTV      Full                                                             +---------+---------------+---------+-----------+----------+-------------------+ PERO  Full                                                             +---------+---------------+---------+-----------+----------+-------------------+ GSV                     Yes      Yes                                      +---------+---------------+---------+-----------+----------+-------------------+     Summary: RIGHT: - No evidence of deep vein thrombosis in the lower extremity. No indirect evidence of obstruction proximal to the inguinal ligament. - prominent lymph nodes noted - Ultrasound characteristics of enlarged lymph nodes are noted in the groin.  LEFT: - There is no evidence of deep vein thrombosis in the lower extremity. However, portions of this examination were limited- see technologist comments above.  - multiple enlarged lymph nodes in left groin, largest measuring 5cm. - Ultrasound characteristics of enlarged lymph nodes noted in the groin.  *See table(s) above for measurements and observations. Electronically signed by Deitra Mayo MD on 05/23/2019 at 11:03:50 AM.    Final         Scheduled Meds: . carvedilol  3.125 mg Oral BID WC  . feeding supplement (ENSURE ENLIVE)  237 mL Oral BID BM  . fentaNYL      . lamoTRIgine  100 mg Oral BID  . levETIRAcetam  750 mg Oral BID  . lidocaine (PF)      . midazolam      . rosuvastatin  5 mg Oral QHS  . sertraline  25 mg Oral Daily  . ticagrelor  90 mg Oral BID   Continuous Infusions: . azithromycin Stopped (05/23/19 0651)  . cefTRIAXone (ROCEPHIN)  IV Stopped (05/23/19 0548)  . heparin 1,600 Units/hr  (05/23/19 2139)     LOS: 2 days     Georgette Shell, MD [facility]   To contact the attending provider between 7A-7P or the covering provider during after hours 7P-7A, please log into the web site [email or web address] and access using universal [place] password for that web site. If you do not have the password, please call the hospital operator.  05/24/2019, 11:15 AM

## 2019-05-24 NOTE — Procedures (Signed)
Interventional Radiology Procedure Note  Procedure: US guided core biopsy of LEFT inguinal nodal mass.   Complications: None  Estimated Blood Loss: None  Recommendations: - Bedrest x 1 hr - Path sent  Signed,  Criselda Peaches, MD

## 2019-05-25 ENCOUNTER — Telehealth: Payer: Self-pay | Admitting: Oncology

## 2019-05-25 LAB — CBC
HCT: 28.9 % — ABNORMAL LOW (ref 39.0–52.0)
Hemoglobin: 8.9 g/dL — ABNORMAL LOW (ref 13.0–17.0)
MCH: 25.9 pg — ABNORMAL LOW (ref 26.0–34.0)
MCHC: 30.8 g/dL (ref 30.0–36.0)
MCV: 84.3 fL (ref 80.0–100.0)
Platelets: 263 10*3/uL (ref 150–400)
RBC: 3.43 MIL/uL — ABNORMAL LOW (ref 4.22–5.81)
RDW: 17.9 % — ABNORMAL HIGH (ref 11.5–15.5)
WBC: 11.5 10*3/uL — ABNORMAL HIGH (ref 4.0–10.5)
nRBC: 0 % (ref 0.0–0.2)

## 2019-05-25 MED ORDER — AMOXICILLIN-POT CLAVULANATE 875-125 MG PO TABS
1.0000 | ORAL_TABLET | Freq: Two times a day (BID) | ORAL | 0 refills | Status: AC
Start: 2019-05-25 — End: 2019-05-29

## 2019-05-25 MED ORDER — ONDANSETRON HCL 4 MG PO TABS: 4.0000 mg | ORAL_TABLET | Freq: Four times a day (QID) | ORAL | 0 refills | Status: AC | PRN

## 2019-05-25 NOTE — Plan of Care (Signed)
  Problem: Education: Goal: Knowledge of General Education information will improve Description: Including pain rating scale, medication(s)/side effects and non-pharmacologic comfort measures Outcome: Adequate for Discharge   Problem: Education: Goal: Ability to demonstrate management of disease process will improve Outcome: Adequate for Discharge Goal: Ability to verbalize understanding of medication therapies will improve Outcome: Adequate for Discharge Goal: Individualized Educational Video(s) Outcome: Adequate for Discharge   Problem: Activity: Goal: Capacity to carry out activities will improve Outcome: Adequate for Discharge   Problem: Cardiac: Goal: Ability to achieve and maintain adequate cardiopulmonary perfusion will improve Outcome: Adequate for Discharge   Problem: Education: Goal: Knowledge of disease or condition will improve Outcome: Adequate for Discharge Goal: Understanding of medication regimen will improve Outcome: Adequate for Discharge Goal: Individualized Educational Video(s) Outcome: Adequate for Discharge   Problem: Activity: Goal: Ability to tolerate increased activity will improve Outcome: Adequate for Discharge   Problem: Cardiac: Goal: Ability to achieve and maintain adequate cardiopulmonary perfusion will improve Outcome: Adequate for Discharge   Problem: Health Behavior/Discharge Planning: Goal: Ability to safely manage health-related needs after discharge will improve Outcome: Adequate for Discharge   Problem: Education: Goal: Expressions of having a comfortable level of knowledge regarding the disease process will increase Outcome: Adequate for Discharge   Problem: Coping: Goal: Ability to adjust to condition or change in health will improve Outcome: Adequate for Discharge Goal: Ability to identify appropriate support needs will improve Outcome: Adequate for Discharge   Problem: Health Behavior/Discharge Planning: Goal: Compliance  with prescribed medication regimen will improve Outcome: Adequate for Discharge   Problem: Medication: Goal: Risk for medication side effects will decrease Outcome: Adequate for Discharge   Problem: Clinical Measurements: Goal: Complications related to the disease process, condition or treatment will be avoided or minimized Outcome: Adequate for Discharge Goal: Diagnostic test results will improve Outcome: Adequate for Discharge   Problem: Safety: Goal: Verbalization of understanding the information provided will improve Outcome: Adequate for Discharge   Problem: Self-Concept: Goal: Level of anxiety will decrease Outcome: Adequate for Discharge Goal: Ability to verbalize feelings about condition will improve Outcome: Adequate for Discharge

## 2019-05-25 NOTE — Telephone Encounter (Signed)
Scheduled appt per 5/7 sch message - unable to reach pt . Left message with apt date and time

## 2019-05-25 NOTE — Progress Notes (Signed)
Events noted overnight.  Biopsy performed without any complications and results are currently pending.  No bleeding noted post biopsy.  I recommended outpatient follow-up if he is ready to be discharged and clinically stable.  Follow-up next week to discuss the results of the biopsy at the cancer center.  Will be scheduled on May 12 at 11:30 AM.  Patient will be notified with his appointment.

## 2019-05-25 NOTE — Discharge Summary (Signed)
Physician Discharge Summary  Tony Saunders [id number] DOB: 09/13/1955 DOA: 05/22/2019  PCP: Tony Saunders, Tony Halsted, MD  Admit date: 05/22/2019 Discharge date: 05/25/2019  Admitted From: Home Disposition: Home  Recommendations for Outpatient Follow-up:  1. Follow up with PCP in 1-2 weeks 2. Please obtain BMP/CBC in one week 3. Please follow up with Dr. Alen Blew next week appointment has already been made.  His office will call and confirm with the patient. 4. Follow-up with cardiologist 5. Please note that upon discharge I have stopped his Cozaar due to soft blood pressure.  Home Health none Equipment/Devices none  Discharge Condition stable and improved  CODE STATUS: Full code Diet recommendation: Cardiac diet  Brief/Interim Summary:HPI per Dr. Hal Saunders on 05/23/2019.   Tony Gibbonsis a 64 y.o.malewithhistory of CAD status post recent acute MI status post stenting, ischemic cardiomyopathy last EF measured was 30% recently atrial fibrillation seizure disorder severe mitral regurgitation and recently found to have elevated LFTs presents to the ER because of worsening swelling of the left lower extremity over the last couple of days. Patient states he was having some pain around the left lower extremity over the last few days the swelling suddenly appeared and started worsening over the last couple of days. Denies any fever chills. No chest pain or shortness of breath. Patient states due to the swelling he has found it difficult to walk. No muscle weakness. No incontinence of urine or bowel.  ED Course:In the ER patient is found to be in atrial flutter with RVR and cardiology was consulted and patient had cardioversion done. Since patient had left lower extremity swelling patient underwent Dopplers of the lower extremity which was negative for DVT but did show lymphadenopathy concerning. CT abdomen pelvis was done which shows preoperative high-grade lymphoma and patient is  admitted for further management. Patient lab work showed leukocytosis of 18.2 with chest pressure and pulmonary when possible infiltrates. High-sensitivity troponin was 13. CBC shows hemoglobin of 10.9 dated 4/26 Covid test was negative.   Discharge Diagnoses:  Principal Problem:   Lymphadenopathy Active Problems:   Temporal lobe epilepsy (HCC)   CAD- S/P PCI   Moderate to severe mitral regurgitation   PAF (paroxysmal atrial fibrillation) (HCC)   Ischemic cardiomyopathy   Atrial flutter with rapid ventricular response (HCC)   Atrial fibrillation with RVR ([place])  #1 A. fib RVR status post cardioversion in ER.  Now in sinus rhythm.  Patient was seen in consultation by cardiology.  They recommended to continue Coreg  and Xarelto.    #2? 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 Extensive retroperitoneal, pelvic and inguinal lymphadenopathy along with involvement of the spleen and near complete infiltration of the right kidney.  Probable occlusions/infiltration of the right renal vein. Findings suspicious for infiltrating bone disease mainly involving the right iliac bone and the left pubic bone. Moderate-sized bilateral pleural effusions with overlying atelectasis Cholelithiasis.  Status post inguinal lymph node biopsy 05/24/2019.  He will follow-up with Dr. Alen Blew next week.  #3 left lower extremity swelling likely from intra-abdominal lymphadenopathy causing obstruction.  #4 history of CAD status post recent stenting and recent MI on Brilinta, Coreg and  Crestor.  #5 ischemic cardiomyopathy ejection fraction 30% echo on 05/14/2019.  On Coreg.  He is not on any diuretics.  #6 community-acquired pneumonia-though his chest x-ray was officially read as pulmonary edema he had leukocytosis with productive cough which was treated with antibiotics.  He will be discharged on Augmentin for 4 more days.  His white count improved and  normalized prior to discharge.  He was not given diuretics during the hospital stay.  He also had very soft blood pressure.    #7 chronic anemia hemoglobin 9.6 stable  #8 history of epilepsy on Lamictal and Keppra  #9 severe mitral regurgitation follow-up with cardiology as an outpatient for further work-up.   Discharge Instructions  Discharge Instructions    Call MD for:  difficulty breathing, headache or visual disturbances   Complete by: As directed    Call MD for:  severe uncontrolled pain   Complete by: As directed    Diet - low sodium heart healthy   Complete by: As directed    Increase activity slowly   Complete by: As directed      Allergies as of 05/25/2019   No Known Allergies     Medication List    STOP taking these medications   aspirin EC 81 MG tablet   atorvastatin 40 MG tablet Commonly known as: LIPITOR   losartan 25 MG tablet Commonly known as: COZAAR   naproxen sodium 220 MG tablet Commonly known as: ALEVE     TAKE these medications   amoxicillin-clavulanate 875-125 MG tablet Commonly known as: Augmentin Take 1 tablet by mouth 2 (two) times daily for 4 days.   carvedilol 6.25 MG tablet Commonly known as: COREG Take 0.5 tablets (3.125 mg total) by mouth 2 (two) times daily with a meal.   lamoTRIgine 100 MG tablet Commonly known as: LAMICTAL Take 1 tablet (100 mg total) by mouth 2 (two) times daily.   levETIRAcetam 750 MG tablet Commonly known as: KEPPRA Take 1 tablet (750 mg total) by mouth 2 (two) times daily.   nitroGLYCERIN 0.4 MG SL tablet Commonly known as: NITROSTAT Place 1 tablet (0.4 mg total) under the tongue every 5 (five) minutes as needed for chest pain.   ondansetron 4 MG tablet Commonly known as: ZOFRAN Take 1 tablet (4 mg total) by mouth every 6 (six) hours as needed for nausea.   pantoprazole 40 MG tablet Commonly known as: PROTONIX Take 1 tablet (40 mg total) by mouth daily. What changed:   when to take  this  reasons to take this   rivaroxaban 20 MG Tabs tablet Commonly known as: Xarelto Take 1 tablet (20 mg total) by mouth daily with supper. What changed: when to take this   rosuvastatin 5 MG tablet Commonly known as: CRESTOR Take 1 tablet (5 mg total) by mouth daily. What changed: when to take this   sertraline 25 MG tablet Commonly known as: ZOLOFT Take 1 tablet (25 mg total) by mouth daily.   ticagrelor 90 MG Tabs tablet Commonly known as: BRILINTA Take 1 tablet (90 mg total) by mouth 2 (two) times daily.      Follow-up Information    Leonie Man, MD Follow up on 06/07/2019.   Specialty: Cardiology Why: @ 11AM  Contact information: [address] [place] [address] [phone]        Tony Saunders, Tony Halsted, MD Follow up.   Specialty: Internal Medicine Contact information: [place] [address] [phone]        Wyatt Portela, MD Follow up.   Specialty: Oncology Contact information: [address] [place] [address] [phone]          No Known Allergies  Consultations: Cardiology, oncology and interventional radiology Procedures/Studies: CT Abdomen Pelvis W Contrast  Result Date: 05/22/2019 CLINICAL DATA:  Left groin adenopathy and left lower extremity swelling. EXAM: CT ABDOMEN AND PELVIS WITH CONTRAST TECHNIQUE: Multidetector CT imaging of the abdomen and pelvis was performed using the standard protocol following bolus administration of intravenous contrast. CONTRAST:  115mL OMNIPAQUE IOHEXOL 300 MG/ML  SOLN COMPARISON:  CT scan 05/20/2004 FINDINGS: Lower chest: Moderate-sized bilateral pleural effusions with overlying atelectasis. Coronary artery calcifications are noted. Hepatobiliary: No focal hepatic lesions or intrahepatic biliary dilatation. Small calcified gallstones are noted the gallbladder. No intra or extrahepatic biliary dilatation. Pancreas: No mass or inflammation. No  ductal dilatation. Spleen: Spleen is mildly enlarged and there are splenic lesions. Adrenals/Urinary Tract: The adrenal glands are unremarkable. The right kidney is infiltrated with tumor. 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 The right renal artery is patent but the right renal vein appears to be at least partially occluded. The left kidney is unremarkable. The bladder is grossly normal. Stomach/Bowel: The stomach, duodenum, small bowel and colon are grossly normal without oral contrast. No acute inflammatory changes, mass lesions or obstructive findings. The terminal ileum is normal. The appendix is normal. Vascular/Lymphatic: Moderate age advanced atherosclerotic calcifications involving the aorta iliac arteries. The major venous structures are patent. The right renal vein appears to be at least partially occluded. The portal and splenic veins are patent. Extensive retroperitoneal lymphadenopathy up lifting the aorta. Nodal mass on image 27/3 measures 5.8 x 4.2 cm. Bulk E pelvic adenopathy with cystic/degenerative type changes involving the left obturator lymph nodes. There is also extensive left inguinal adenopathy measuring approximately 8.7 x 7.2 cm. There is also right inguinal adenopathy. Reproductive: The prostate gland and seminal vesicles are unremarkable. Other: Mild presacral edema and small amount of free pelvic fluid. Musculoskeletal: Findings suspicious for infiltrating bone disease mainly involving the right iliac bone and the left pubic bone. IMPRESSION: 1. 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 Extensive retroperitoneal, pelvic and inguinal lymphadenopathy along with involvement of the spleen and near complete infiltration of the right kidney. 2. Probable occlusions/infiltration of the right renal vein. 3. Findings suspicious for infiltrating bone disease mainly involving the right iliac bone and the left pubic bone. 4. Moderate-sized bilateral pleural effusions with  overlying atelectasis. 5. Cholelithiasis. 6. Age advanced atherosclerotic calcifications involving the aorta and iliac arteries. Aortic Atherosclerosis (ICD10-I70.0). Electronically Signed   By: Marijo Sanes M.D.   On: 05/22/2019 21:01   DG Chest Portable 1 View  Result Date: 05/22/2019 CLINICAL DATA:  Atrial fibrillation, short of breath, lower extremity edema EXAM: PORTABLE CHEST 1 VIEW COMPARISON:  03/27/2019 FINDINGS: Single frontal view of the chest demonstrates external defibrillator pad overlying left hilum. Cardiac silhouette is enlarged but stable. Bibasilar veiling opacities, right greater than left, consistent with airspace disease and effusions. No pneumothorax. IMPRESSION: 1. Findings consistent with pulmonary edema, with bibasilar airspace disease and effusions. Electronically Signed   By: Randa Ngo M.D.   On: 05/22/2019 17:50   ECHOCARDIOGRAM COMPLETE  Result Date: 05/14/2019    ECHOCARDIOGRAM REPORT   Patient Name:   Tony Saunders Date of Exam: 05/14/2019 Medical Rec #:  [id number]     Height:       72.0 in Accession #:    [id number]    Weight:       189.2 lb Date of Birth:  05-24-1955     BSA:          2.081 m Patient Age:    64 years      BP:  92/66 mmHg Patient Gender: M             HR:           106 bpm. Exam Location:  [facility] Procedure: 2D Echo, Cardiac Doppler and Color Doppler Indications:    I25.5 Ischemic cardiomyopathy; I05.9 Mitral valve disorder  History:        Patient has prior history of Echocardiogram examinations, most                 recent 03/28/2019. CAD and Previous Myocardial Infarction, COPD,                 Arrythmias:Atrial Fibrillation; Risk Factors:Dyslipidemia.                 Ischemic cardiomyopathy. Seizure disorder.  Sonographer:    Diamond Nickel RCS Referring Phys: [place]  Sonographer Comments: Notified Dr. Johnsie Cancel (DOD) of the patient's blood pressure reading 92/66. Per Dr. Johnsie Cancel, patient ok to be discharged. Instructed patient to  follow-up with Kerin Ransom. IMPRESSIONS  1. Compared to echo in March 2021, no significant change in LVEF or MR. Pt is now in atrial fibrillation.  2. LVEF is severely depressed with severe hypokinesis/akinesis of the base/mid inferior, inferosepal, infer walls and apical walls; hypokinesis elsewhere . Left ventricular ejection fraction, by estimation, is 30%. The left ventricle has severely decreased function. The left ventricular internal cavity size was mildly dilated. There is mild left ventricular hypertrophy. Left ventricular diastolic parameters are indeterminate.  3. Right ventricular systolic function is normal. The right ventricular size is normal. There is normal pulmonary artery systolic pressure.  4. Left atrial size was mildly dilated.  5. MR is directed posteriorly into LA, likely due to tethering of posterior leaflet. Severe mitral valve regurgitation.  6. The aortic valve is tricuspid. Aortic valve regurgitation is not visualized. Mild aortic valve sclerosis is present, with no evidence of aortic valve stenosis.  7. The inferior vena cava is normal in size with greater than 50% respiratory variability, suggesting right atrial pressure of 3 mmHg. FINDINGS  Left Ventricle: LVEF is severely depressed with severe hypokinesis/akinesis of the base/mid inferior, inferosepal, infer walls and apical walls; hypokinesis elsewhere. Left ventricular ejection fraction, by estimation, is 30%%. The left ventricle has severely decreased function. The left ventricle demonstrates regional wall motion abnormalities. The left ventricular internal cavity size was mildly dilated. There is mild left ventricular hypertrophy. Left ventricular diastolic parameters are indeterminate. Right Ventricle: The right ventricular size is normal. Right vetricular wall thickness was not assessed. Right ventricular systolic function is normal. There is normal pulmonary artery systolic pressure. The tricuspid regurgitant velocity is 2.41  m/s, and with an assumed right atrial pressure of 10 mmHg, the estimated right ventricular systolic pressure is [id number] mmHg. Left Atrium: Left atrial size was mildly dilated. Right Atrium: Right atrial size was normal in size. Pericardium: Trivial pericardial effusion is present. Mitral Valve: MR is directed posteriorly into LA, likely due to tethering of posterior leaflet. The mitral valve is grossly normal. Severe mitral valve regurgitation, with posteriorly-directed jet. Tricuspid Valve: The tricuspid valve is normal in structure. Tricuspid valve regurgitation is mild. Aortic Valve: The aortic valve is tricuspid. Aortic valve regurgitation is not visualized. Mild aortic valve sclerosis is present, with no evidence of aortic valve stenosis. Pulmonic Valve: The pulmonic valve was normal in structure. Pulmonic valve regurgitation is not visualized. Aorta: The aortic root and ascending aorta are structurally normal, with no evidence of dilitation. Venous:  The inferior vena cava is normal in size with greater than 50% respiratory variability, suggesting right atrial pressure of 3 mmHg. IAS/Shunts: No atrial level shunt detected by color flow Doppler.  LEFT VENTRICLE PLAX 2D LVIDd:         5.50 cm LVIDs:         4.80 cm LV PW:         1.10 cm LV IVS:        1.20 cm LVOT diam:     2.35 cm LV SV:         75 LV SV Index:   36 LVOT Area:     4.34 cm  RIGHT VENTRICLE RV Basal diam:  2.10 cm TAPSE (M-mode): 1.7 cm RVSP:           26.2 mmHg LEFT ATRIUM             Index       RIGHT ATRIUM           Index LA diam:        4.60 cm 2.21 cm/m  RA Pressure: 3.00 mmHg LA Vol (A2C):   75.8 ml 36.43 ml/m RA Area:     17.90 cm LA Vol (A4C):   61.7 ml 29.65 ml/m RA Volume:   44.40 ml  21.34 ml/m LA Biplane Vol: 75.0 ml 36.04 ml/m  AORTIC VALVE LVOT Vmax:   98.93 cm/s LVOT Vmean:  62.767 cm/s LVOT VTI:    0.172 m  AORTA Ao Root diam: 3.10 cm MR Peak grad:    81.6 mmHg   TRICUSPID VALVE MR Mean grad:    51.8 mmHg   TR Peak grad:    23.2 mmHg MR Vmax:         451.80 cm/s TR Vmax:        241.00 cm/s MR Vmean:        336.4 cm/s  Estimated RAP:  3.00 mmHg MR PISA:         3.08 cm    RVSP:           26.2 mmHg MR PISA Eff ROA: 19 mm MR PISA Radius:  0.70 cm     SHUNTS                              Systemic VTI:  0.17 m                              Systemic Diam: 2.35 cm Dorris Carnes MD Electronically signed by Dorris Carnes MD Signature Date/Time: 05/14/2019/3:13:00 PM    Final    [place] CORE BIOPSY (LYMPH NODES)  Result Date: 05/24/2019 INDICATION: 64 year old male with multifocal lymphadenopathy and infiltrative lesion within the right kidney. Findings are concerning for lymphoma. He presents for ultrasound-guided biopsy of a left superficial inguinal lymph node. EXAM: ULTRASOUND OF THE LYMPH NODES MEDICATIONS: None. ANESTHESIA/SEDATION: Moderate (conscious) sedation was employed during this procedure. A total of Versed 1 mg and Fentanyl 50 mcg was administered intravenously. Moderate Sedation Time: 12 minutes. The patient's level of consciousness and vital signs were monitored continuously by radiology nursing throughout the procedure under my direct supervision. FLUOROSCOPY TIME:  None. COMPLICATIONS: None immediate. PROCEDURE: Informed written consent was obtained from the patient after a thorough discussion of the procedural risks, benefits and alternatives. All questions were addressed. A timeout was performed prior to the initiation of the  procedure. Ultrasound was used to interrogate the left inguinal region. Multifocal enlarged hypoechoic lymph nodes are identified. A suitable skin entry site was selected and marked. The skin was sterilely prepped and draped in the standard fashion using chlorhexidine skin prep. Local anesthesia was attained by infiltration with 1% lidocaine. A small dermatotomy was made. Under real-time ultrasound guidance, multiple 16 gauge core biopsies were obtained using the Bard automated biopsy device. Specimens were  placed in saline and delivered to pathology for further analysis. Post biopsy imaging demonstrates no evidence of immediate complication. IMPRESSION: Successful ultrasound-guided core biopsy of left superficial inguinal lymphadenopathy. Signed, Criselda Peaches, MD, [facility] Electronically Signed   By: Jacqulynn Cadet M.D.   On: 05/24/2019 14:44   DG Hip Port Unilat W or Wo Pelvis 1 View Left  Result Date: 05/22/2019 CLINICAL DATA:  AFib, shortness of breath EXAM: DG HIP (WITH OR WITHOUT PELVIS) 1V PORT LEFT COMPARISON:  None. FINDINGS: No displaced fracture or dislocation of the left hip or included pelvis. There is moderate joint space narrowing and osteophytosis. Nonobstructive pattern of overlying bowel gas. IMPRESSION: No displaced fracture or dislocation of the left hip. There is moderate joint space narrowing and osteophytosis. Electronically Signed   By: Eddie Candle M.D.   On: 05/22/2019 17:51   VAS [place] LOWER EXTREMITY VENOUS (DVT) (MC and WL 7a-7p)  Result Date: 05/23/2019  Lower Venous DVTStudy Indications: Pain, Swelling, and extreme unilateral swelling.  Limitations: Poor ultrasound/tissue interface. Performing Technologist: Antonieta Pert RDMS, RVT  Examination Guidelines: A complete evaluation includes B-mode imaging, spectral Doppler, color Doppler, and power Doppler as needed of all accessible portions of each vessel. Bilateral testing is considered an integral part of a complete examination. Limited examinations for reoccurring indications may be performed as noted. The reflux portion of the exam is performed with the patient in reverse Trendelenburg.  +-----+---------------+---------+-----------+----------+--------------+ RIGHTCompressibilityPhasicitySpontaneityPropertiesThrombus Aging +-----+---------------+---------+-----------+----------+--------------+ CFV  Full           Yes      Yes                                  +-----+---------------+---------+-----------+----------+--------------+   +---------+---------------+---------+-----------+----------+-------------------+ LEFT     CompressibilityPhasicitySpontaneityPropertiesThrombus Aging      +---------+---------------+---------+-----------+----------+-------------------+ CFV                     Yes      Yes                  unable to compress                                                        due to adjecent                                                           enlarged lymph  nodes               +---------+---------------+---------+-----------+----------+-------------------+ SFJ                     Yes      Yes                                      +---------+---------------+---------+-----------+----------+-------------------+ FV Prox  Full                                                             +---------+---------------+---------+-----------+----------+-------------------+ FV Mid   Full                                                             +---------+---------------+---------+-----------+----------+-------------------+ FV DistalFull                                                             +---------+---------------+---------+-----------+----------+-------------------+ PFV      Full                                                             +---------+---------------+---------+-----------+----------+-------------------+ POP      Full           Yes      Yes                                      +---------+---------------+---------+-----------+----------+-------------------+ PTV      Full                                                             +---------+---------------+---------+-----------+----------+-------------------+ PERO     Full                                                              +---------+---------------+---------+-----------+----------+-------------------+ GSV                     Yes      Yes                                      +---------+---------------+---------+-----------+----------+-------------------+  Summary: RIGHT: - No evidence of deep vein thrombosis in the lower extremity. No indirect evidence of obstruction proximal to the inguinal ligament. - prominent lymph nodes noted - Ultrasound characteristics of enlarged lymph nodes are noted in the groin.  LEFT: - There is no evidence of deep vein thrombosis in the lower extremity. However, portions of this examination were limited- see technologist comments above.  - multiple enlarged lymph nodes in left groin, largest measuring 5cm. - Ultrasound characteristics of enlarged lymph nodes noted in the groin.  *See table(s) above for measurements and observations. Electronically signed by Deitra Mayo MD on 05/23/2019 at 11:03:50 AM.    Final     (Echo, Carotid, EGD, Colonoscopy, ERCP)    Subjective: Patient awake alert anxious to go home on room air saturating normal continues to have a productive cough  Discharge Exam: Vitals:   05/25/19 0742 05/25/19 0749  BP: 107/70 101/69  Pulse: (!) 119 (!) 118  Resp: (!) 21 20  Temp: 98.1 F (36.7 C) 99.2 F (37.3 C)  SpO2: 99% 93%   Vitals:   05/25/19 0041 05/25/19 0414 05/25/19 0742 05/25/19 0749  BP:  105/68 107/70 101/69  Pulse:  (!) 109 (!) 119 (!) 118  Resp:  20 (!) 21 20  Temp:  98.4 F (36.9 C) 98.1 F (36.7 C) 99.2 F (37.3 C)  TempSrc:  Oral Oral Oral  SpO2:  97% 99% 93%  Weight: 82.7 kg     Height:        General: Pt is alert, awake, not in acute distress Cardiovascular: RRR, S1/S2 +, no rubs, no gallops Respiratory: Coarse breath sounds bilaterally bilaterally, no wheezing, no rhonchi Abdominal: Soft, NT, ND, bowel sounds + Extremities: no edema, no cyanosis    The results of significant diagnostics from this hospitalization  (including imaging, microbiology, ancillary and laboratory) are listed below for reference.     Microbiology: Recent Results (from the past 240 hour(s))  Respiratory Panel by RT PCR (Flu A&B, Covid) - Nasopharyngeal Swab     Status: None   Collection Time: 05/22/19  4:51 PM   Specimen: Nasopharyngeal Swab  Result Value Ref Range Status   SARS Coronavirus 2 by RT PCR NEGATIVE NEGATIVE Final    Comment: (NOTE) SARS-CoV-2 target nucleic acids are NOT DETECTED. The SARS-CoV-2 RNA is generally detectable in upper respiratoy specimens during the acute phase of infection. The lowest concentration of SARS-CoV-2 viral copies this assay can detect is 131 copies/mL. A negative result does not preclude SARS-Cov-2 infection and should not be used as the sole basis for treatment or other patient management decisions. A negative result may occur with  improper specimen collection/handling, submission of specimen other than nasopharyngeal swab, presence of viral mutation(s) within the areas targeted by this assay, and inadequate number of viral copies (<131 copies/mL). A negative result must be combined with clinical observations, patient history, and epidemiological information. The expected result is Negative. Fact Sheet for Patients:  PinkCheek.be Fact Sheet for Healthcare Providers:  GravelBags.it This test is not yet ap proved or cleared by the [place] FDA and  has been authorized for detection and/or diagnosis of SARS-CoV-2 by FDA under an Emergency Use Authorization (EUA). This EUA will remain  in effect (meaning this test can be used) for the duration of the COVID-19 declaration under Section 564(b)(1) of the Act, 21 U.S.C. section 360bbb-3(b)(1), unless the authorization is terminated or revoked sooner.    Influenza A by PCR NEGATIVE NEGATIVE Final   Influenza  B by PCR NEGATIVE NEGATIVE Final    Comment: (NOTE) The Xpert  Xpress SARS-CoV-2/FLU/RSV assay is intended as an aid in  the diagnosis of influenza from Nasopharyngeal swab specimens and  should not be used as a sole basis for treatment. Nasal washings and  aspirates are unacceptable for Xpert Xpress SARS-CoV-2/FLU/RSV  testing. Fact Sheet for Patients: PinkCheek.be Fact Sheet for Healthcare Providers: GravelBags.it This test is not yet approved or cleared by the [place] FDA and  has been authorized for detection and/or diagnosis of SARS-CoV-2 by  FDA under an Emergency Use Authorization (EUA). This EUA will remain  in effect (meaning this test can be used) for the duration of the  Covid-19 declaration under Section 564(b)(1) of the Act, 21  U.S.C. section 360bbb-3(b)(1), unless the authorization is  terminated or revoked. Performed at [facility], [place] [address], [place], [place] [address]      Labs: BNP (last 3 results) No results for input(s): BNP in the last 8760 hours. Basic Metabolic Panel: Recent Labs  Lab 05/22/19 1603 05/23/19 0418  NA 135 135  K 4.2 3.7  CL 98 98  CO2 23 23  GLUCOSE 100* 89  BUN 15 15  CREATININE 1.15 1.20  CALCIUM 8.5* 8.0*   Liver Function Tests: Recent Labs  Lab 05/23/19 0418  AST 29  ALT 23  ALKPHOS 66  BILITOT 0.6  PROT 4.6*  ALBUMIN 2.0*   No results for input(s): LIPASE, AMYLASE in the last 168 hours. No results for input(s): AMMONIA in the last 168 hours. CBC: Recent Labs  Lab 05/22/19 1603 05/23/19 0418 05/24/19 0419 05/25/19 0530  WBC 18.2* 13.6* 11.5* 11.5*  HGB 10.9* 9.7* 9.6* 8.9*  HCT 36.4* 32.0* 32.2* 28.9*  MCV 84.5 83.8 83.6 84.3  PLT 426* 308 282 263   Cardiac Enzymes: No results for input(s): CKTOTAL, CKMB, CKMBINDEX, TROPONINI in the last 168 hours. BNP: Invalid input(s): POCBNP CBG: No results for input(s): GLUCAP in the last 168 hours. D-Dimer No results for input(s): DDIMER in the last 72  hours. Hgb A1c No results for input(s): HGBA1C in the last 72 hours. Lipid Profile No results for input(s): CHOL, HDL, LDLCALC, TRIG, CHOLHDL, LDLDIRECT in the last 72 hours. Thyroid function studies No results for input(s): TSH, T4TOTAL, T3FREE, THYROIDAB in the last 72 hours.  Invalid input(s): FREET3 Anemia work up No results for input(s): VITAMINB12, FOLATE, FERRITIN, TIBC, IRON, RETICCTPCT in the last 72 hours. Urinalysis No results found for: COLORURINE, APPEARANCEUR, [place], [place], [place], [place], [place], [place], PROTEINUR, UROBILINOGEN, NITRITE, LEUKOCYTESUR Sepsis Labs Invalid input(s): PROCALCITONIN,  WBC,  LACTICIDVEN Microbiology Recent Results (from the past 240 hour(s))  Respiratory Panel by RT PCR (Flu A&B, Covid) - Nasopharyngeal Swab     Status: None   Collection Time: 05/22/19  4:51 PM   Specimen: Nasopharyngeal Swab  Result Value Ref Range Status   SARS Coronavirus 2 by RT PCR NEGATIVE NEGATIVE Final    Comment: (NOTE) SARS-CoV-2 target nucleic acids are NOT DETECTED. The SARS-CoV-2 RNA is generally detectable in upper respiratoy specimens during the acute phase of infection. The lowest concentration of SARS-CoV-2 viral copies this assay can detect is 131 copies/mL. A negative result does not preclude SARS-Cov-2 infection and should not be used as the sole basis for treatment or other patient management decisions. A negative result may occur with  improper specimen collection/handling, submission of specimen other than nasopharyngeal swab, presence of viral mutation(s) within the areas targeted by this assay, and  inadequate number of viral copies (<131 copies/mL). A negative result must be combined with clinical observations, patient history, and epidemiological information. The expected result is Negative. Fact Sheet for Patients:  PinkCheek.be Fact Sheet for Healthcare Providers:   GravelBags.it This test is not yet ap proved or cleared by the [place] FDA and  has been authorized for detection and/or diagnosis of SARS-CoV-2 by FDA under an Emergency Use Authorization (EUA). This EUA will remain  in effect (meaning this test can be used) for the duration of the COVID-19 declaration under Section 564(b)(1) of the Act, 21 U.S.C. section 360bbb-3(b)(1), unless the authorization is terminated or revoked sooner.    Influenza A by PCR NEGATIVE NEGATIVE Final   Influenza B by PCR NEGATIVE NEGATIVE Final    Comment: (NOTE) The Xpert Xpress SARS-CoV-2/FLU/RSV assay is intended as an aid in  the diagnosis of influenza from Nasopharyngeal swab specimens and  should not be used as a sole basis for treatment. Nasal washings and  aspirates are unacceptable for Xpert Xpress SARS-CoV-2/FLU/RSV  testing. Fact Sheet for Patients: PinkCheek.be Fact Sheet for Healthcare Providers: GravelBags.it This test is not yet approved or cleared by the [place] FDA and  has been authorized for detection and/or diagnosis of SARS-CoV-2 by  FDA under an Emergency Use Authorization (EUA). This EUA will remain  in effect (meaning this test can be used) for the duration of the  Covid-19 declaration under Section 564(b)(1) of the Act, 21  U.S.C. section 360bbb-3(b)(1), unless the authorization is  terminated or revoked. Performed at [facility], [place] [address], [place], [place] [address]      Time coordinating discharge: 39 minutes  SIGNED:   Georgette Shell, MD  [facility] 05/25/2019, 9:43 AM Pager   If 7PM-7AM, please contact night-coverage [email or web address] Password [email or web address]

## 2019-05-28 LAB — SURGICAL PATHOLOGY

## 2019-05-29 ENCOUNTER — Other Ambulatory Visit: Payer: Self-pay

## 2019-05-29 ENCOUNTER — Ambulatory Visit (HOSPITAL_COMMUNITY)
Admission: RE | Admit: 2019-05-29 | Discharge: 2019-05-29 | Disposition: A | Discharge status: Home / Self Care | Payer: No Typology Code available for payment source | Source: Ambulatory Visit | Attending: Internal Medicine | Admitting: Internal Medicine

## 2019-05-29 ENCOUNTER — Encounter (HOSPITAL_COMMUNITY): Payer: Self-pay | Admitting: Internal Medicine

## 2019-05-29 VITALS — BP 102/60 | HR 111

## 2019-05-29 DIAGNOSIS — I480 Paroxysmal atrial fibrillation: Secondary | ICD-10-CM | POA: Diagnosis not present

## 2019-05-29 DIAGNOSIS — Z7901 Long term (current) use of anticoagulants: Secondary | ICD-10-CM | POA: Insufficient documentation

## 2019-05-29 DIAGNOSIS — I2510 Atherosclerotic heart disease of native coronary artery without angina pectoris: Secondary | ICD-10-CM | POA: Diagnosis not present

## 2019-05-29 DIAGNOSIS — Z8249 Family history of ischemic heart disease and other diseases of the circulatory system: Secondary | ICD-10-CM | POA: Insufficient documentation

## 2019-05-29 DIAGNOSIS — G40909 Epilepsy, unspecified, not intractable, without status epilepticus: Secondary | ICD-10-CM | POA: Diagnosis not present

## 2019-05-29 DIAGNOSIS — M79606 Pain in leg, unspecified: Secondary | ICD-10-CM | POA: Insufficient documentation

## 2019-05-29 DIAGNOSIS — Z955 Presence of coronary angioplasty implant and graft: Secondary | ICD-10-CM | POA: Insufficient documentation

## 2019-05-29 DIAGNOSIS — C8593 Non-Hodgkin lymphoma, unspecified, intra-abdominal lymph nodes: Secondary | ICD-10-CM

## 2019-05-29 DIAGNOSIS — J449 Chronic obstructive pulmonary disease, unspecified: Secondary | ICD-10-CM | POA: Diagnosis not present

## 2019-05-29 DIAGNOSIS — R590 Localized enlarged lymph nodes: Secondary | ICD-10-CM | POA: Diagnosis not present

## 2019-05-29 DIAGNOSIS — R531 Weakness: Secondary | ICD-10-CM | POA: Diagnosis present

## 2019-05-29 DIAGNOSIS — R634 Abnormal weight loss: Secondary | ICD-10-CM | POA: Diagnosis not present

## 2019-05-29 DIAGNOSIS — I340 Nonrheumatic mitral (valve) insufficiency: Secondary | ICD-10-CM | POA: Diagnosis not present

## 2019-05-29 DIAGNOSIS — I255 Ischemic cardiomyopathy: Secondary | ICD-10-CM | POA: Insufficient documentation

## 2019-05-29 DIAGNOSIS — I4892 Unspecified atrial flutter: Secondary | ICD-10-CM | POA: Insufficient documentation

## 2019-05-29 DIAGNOSIS — R748 Abnormal levels of other serum enzymes: Secondary | ICD-10-CM | POA: Diagnosis not present

## 2019-05-29 DIAGNOSIS — Z87891 Personal history of nicotine dependence: Secondary | ICD-10-CM | POA: Diagnosis not present

## 2019-05-29 DIAGNOSIS — I252 Old myocardial infarction: Secondary | ICD-10-CM | POA: Insufficient documentation

## 2019-05-29 DIAGNOSIS — Z79899 Other long term (current) drug therapy: Secondary | ICD-10-CM | POA: Diagnosis not present

## 2019-05-29 DIAGNOSIS — Z7902 Long term (current) use of antithrombotics/antiplatelets: Secondary | ICD-10-CM | POA: Diagnosis not present

## 2019-05-29 DIAGNOSIS — I5022 Chronic systolic (congestive) heart failure: Secondary | ICD-10-CM | POA: Diagnosis not present

## 2019-05-29 LAB — SURGICAL PATHOLOGY

## 2019-05-29 MED ORDER — FUROSEMIDE 40 MG PO TABS
40.0000 mg | ORAL_TABLET | Freq: Every day | ORAL | 3 refills | Status: DC | PRN
Start: 2019-05-29 — End: 2019-06-30

## 2019-05-29 MED ORDER — POTASSIUM CHLORIDE CRYS ER 20 MEQ PO TBCR
20.0000 meq | EXTENDED_RELEASE_TABLET | Freq: Every day | ORAL | 3 refills | Status: DC | PRN
Start: 2019-05-29 — End: 2019-07-05

## 2019-05-29 MED ORDER — MIDODRINE HCL 2.5 MG PO TABS: 2.5000 mg | ORAL_TABLET | Freq: Three times a day (TID) | ORAL | 3 refills | Status: DC

## 2019-05-29 MED ORDER — AMIODARONE HCL 200 MG PO TABS
200.0000 mg | ORAL_TABLET | Freq: Two times a day (BID) | ORAL | 3 refills | Status: DC
Start: 2019-05-29 — End: 2019-06-30

## 2019-05-29 NOTE — Patient Instructions (Signed)
Start Furosemide 40 mg AS NEEDED  Take Potassium 20 meq WHEN YOU TAKE FUROSEMIDE  Start Midodrine 2.5 mg Three times a day   Start Amiodarone 200 mg Twice daily   Your physician recommends that you schedule a follow-up appointment in: 4 weeks  If you have any questions or concerns before your next appointment please send [place] a message through [place] or call our office at [phone].  At the [facility], you and your health needs are our priority. As part of our continuing mission to provide you with exceptional heart care, we have created designated Provider Care Teams. These Care Teams include your primary Cardiologist (physician) and Advanced Practice Providers (APPs- Physician Assistants and Nurse Practitioners) who all work together to provide you with the care you need, when you need it.   You may see any of the following providers on your designated Care Team at your next follow up: Marland Kitchen Dr Glori Bickers . Dr Loralie Champagne . Darrick Grinder, NP . Lyda Jester, PA . Audry Riles, PharmD   Please be sure to bring in all your medications bottles to every appointment.

## 2019-05-29 NOTE — Progress Notes (Signed)
ADVANCED HF CLINIC CONSULT NOTE  Referring Physician: Primary Care: Primary Cardiologist:  HPI:  Tony Saunders is a 64 y.o. male with a hx of CAD, COPD, seizure disorder, ischemic cardiomyopathy/chronic systolic heart failure and paroxysmal atrial fibrillation who is being seen today for the evaluation of HF at the request of Dr. Ellyn Hack.   Patient has history of CAD s/p RCA PCI x2 in 2006.  Most recently patient admitted 03/2019 with acute inferior STEMI.Catheterization revealed patent proximal stents in the RCA with distal occlusion of the RCA which was felt to be the culprit vessel. He also had high-grade stenosis in the circumflex and LAD. His EF was 30 to 35% with moderate to severe MR. Patient underwent urgent intervention to the RCA with multiple stents. He essentially now has a full metal jacket in the RCA. He was brought back on 03/29/2019 for staged intervention of the circumflex which received 3 stents and the LAD which received a stent. His hospital course was complicated by PAF which was brief and converted spontaneously. He did had elevated LFTs in the hospital. There is a history of some alcohol use although he says he is not drinking anymore. Because of his elevated LFTs he was sent home on low-dose Lipitor, this was later stopped altogether 1 follow-up LFTs were still elevated. LFTs have subsequently normalized.   Patient was seen by Kerin Ransom April 16, 2019 for hospital follow-up.  He was noted in atrial fibrillation and anticoagulation started with Eliquis later changed to Xarelto.  He has been compliant with his anticoagulation. He was hypotensive and subsequently required discontinuation of losartan and reduction of Coreg.  Most recent echocardiogram May 14, 2019 showed persistent low EF at 35-40% (read as 30%) with severe MR   He was readmitted 5/4-05/25/19 for LLE pain and swelling. On arrival to ER was in atrial flutter with RVR and was cardioverted.   Since  patient had left lower extremity swelling patient underwent Dopplers of the lower extremity which was negative for DVT but did show lymphadenopathy concerning. CT abdomen pelvis was done which showed high-grade/aggressive lymphoma with extensive retroperitoneal, pelvic, and inguinal lymphadenopathy.  There is also involvement of the spleen and near complete infiltration of the right kidney.  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  Suspicious for infiltrating disease of the right iliac and left pubic bones.   He is here with his family for further evaluation. Feels very weak. Unable to stand due to leg pain/weakness. Has episodes where he feels like he can't catch his breath but no PND or orthopnea. Swelling only in left leg and scrotum. Losing weight. Denies palpitations. No bleeding on Xarelto and Brilinta.  SBP in 90s.   Review of Systems: [y] = yes, [ ]  = no   General: Weight gain [ ] ; Weight loss [ y]; Anorexia [ y]; Fatigue Blue.Reese ]; Fever [ ] ; Chills [ ] ; Weakness Blue.Reese ]  Cardiac: Chest pain/pressure [ ] ; Resting SOB Blue.Reese ]; Exertional SOB [ y]; Orthopnea [ ] ; Pedal Edema [ y]; Palpitations [ ] ; Syncope [ ] ; Presyncope [ ] ; Paroxysmal nocturnal dyspnea[ ]   Pulmonary: Cough [ ] ; Wheezing[ ] ; Hemoptysis[ ] ; Sputum [ ] ; Snoring [ ]   GI: Vomiting[ ] ; Dysphagia[ ] ; Melena[ ] ; Hematochezia [ ] ; Heartburn[ ] ; Abdominal pain [ ] ; Constipation [ ] ; Diarrhea [ ] ; BRBPR [ ]   GU: Hematuria[ ] ; Dysuria [ ] ; Nocturia[ ]   Vascular: Pain in legs with walking Blue.Reese ]; Pain in feet with lying flat [ ] ;  Non-healing sores [ ] ; Stroke [ ] ; TIA [ ] ; Slurred speech [ ] ;  Neuro: Headaches[ ] ; Vertigo[ ] ; Seizures[ ] ; Paresthesias[ ] ;Blurred vision [ ] ; Diplopia [ ] ; Vision changes [ ]   Ortho/Skin: Arthritis Blue.Reese ]; Joint pain [ y]; Muscle pain [ ] ; Joint swelling [ ] ; Back Pain [ ] ; Rash [ ]   Psych: Depression[y ]; Anxiety[ ]   Heme: Bleeding problems [ ] ; Clotting disorders [ ] ; Anemia [ ]   Endocrine: Diabetes [ ] ; Thyroid  dysfunction[ ]    Past Medical History:  Diagnosis Date  . CHF (congestive heart failure) ([place])   . Coronary artery disease   . Myocardial infarction ([place])   . Seizures ([place])    most recent today, 07/23/15    Current Outpatient Medications  Medication Sig Dispense Refill  . amoxicillin-clavulanate (AUGMENTIN) 875-125 MG tablet Take 1 tablet by mouth 2 (two) times daily for 4 days. 8 tablet 0  . carvedilol (COREG) 6.25 MG tablet Take 0.5 tablets (3.125 mg total) by mouth 2 (two) times daily with a meal. 60 tablet 11  . lamoTRIgine (LAMICTAL) 100 MG tablet Take 1 tablet (100 mg total) by mouth 2 (two) times daily. 180 tablet 3  . levETIRAcetam (KEPPRA) 750 MG tablet Take 1 tablet (750 mg total) by mouth 2 (two) times daily. 180 tablet 3  . ondansetron (ZOFRAN) 4 MG tablet Take 1 tablet (4 mg total) by mouth every 6 (six) hours as needed for nausea. 20 tablet 0  . pantoprazole (PROTONIX) 40 MG tablet Take 1 tablet (40 mg total) by mouth daily. (Patient taking differently: Take 40 mg by mouth daily as needed (for severe nausea). ) 30 tablet 3  . rivaroxaban (XARELTO) 20 MG TABS tablet Take 1 tablet (20 mg total) by mouth daily with supper. (Patient taking differently: Take 20 mg by mouth daily at 8 pm. ) 30 tablet 11  . rosuvastatin (CRESTOR) 5 MG tablet Take 1 tablet (5 mg total) by mouth daily. (Patient taking differently: Take 5 mg by mouth at bedtime. ) 30 tablet 6  . sertraline (ZOLOFT) 25 MG tablet Take 1 tablet (25 mg total) by mouth daily. 90 tablet 3  . ticagrelor (BRILINTA) 90 MG TABS tablet Take 1 tablet (90 mg total) by mouth 2 (two) times daily. 180 tablet 3  . nitroGLYCERIN (NITROSTAT) 0.4 MG SL tablet Place 1 tablet (0.4 mg total) under the tongue every 5 (five) minutes as needed for chest pain. (Patient not taking: Reported on 05/29/2019) 25 tablet 4   No current facility-administered medications for this encounter.    No Known Allergies    Social History   Socioeconomic  History  . Marital status: Married    Spouse name: [name]  . Number of children: 3  . Years of education: 58, [employer]  . 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 Smoking status: Former Smoker    Types: Cigarettes    Quit date: 01/22/2018    Years since quitting: 1.3  . Smokeless tobacco: Never Used  . Tobacco comment: 20  Substance and Sexual Activity  . Alcohol use: Yes    Alcohol/week: 0.0 standard drinks    Comment: 2-5 beers daily  . Drug use: No  . Sexual activity: Yes  Other Topics Concern  . Not on file  Social History Narrative   Lives with wife   Right-handed   Caffeine: 3-4 cups per day   Social Determinants  of Health   Financial Resource Strain:   . Difficulty of Paying Living Expenses:   Food Insecurity:   . Worried About Charity fundraiser in the Last Year:   . Arboriculturist in the Last Year:   Transportation Needs:   . Film/video editor (Medical):   Marland Kitchen Lack of Transportation (Non-Medical):   Physical Activity:   . Days of Exercise per Week:   . Minutes of Exercise per Session:   Stress:   . Feeling of Stress :   Social Connections:   . Frequency of Communication with Friends and Family:   . Frequency of Social Gatherings with Friends and Family:   . Attends Religious Services:   . Active Member of Clubs or Organizations:   . Attends Archivist Meetings:   Marland Kitchen Marital Status:   Intimate Partner Violence:   . Fear of Current or Ex-Partner:   . Emotionally Abused:   Marland Kitchen Physically Abused:   . Sexually Abused:       Family History  Problem Relation Age of Onset  . Heart disease Father   . Healthy Mother   . Heart disease Paternal Grandfather     Vitals:   05/29/19 1141  BP: 102/60  Pulse: (!) 111  SpO2: 98%    PHYSICAL EXAM: General:  Weak and cachetic appearing. No respiratory difficulty HEENT: normal Neck: supple. JVP 6-7 Carotids 2+ bilat; no bruits.  No lymphadenopathy or thryomegaly appreciated. Cor: PMI nondisplaced. Irreg tachy 2/6 MR. Lungs: clear Abdomen: soft, nontender, +distended. No hepatosplenomegaly. No bruits or masses. Good bowel sounds. Extremities: no cyanosis, clubbing, rash, 3-4+ lymphedema in left leg up to pelvis. + scrotal edema. No edema in RLE Neuro: alert & oriented x 3, cranial nerves grossly intact. moves all 4 extremities w/o difficulty. Affect pleasant.  ECG: AF 108 Personally reviewed    ASSESSMENT & PLAN:  1. Atrial flutter/fibrillation  with rapid ventricular rate - failed DC-CV earlier this month - now with recurrent AF with RVR despite low-dose b-blockade. BP too low to effectively increase b-blocker - add amio 200 bid - currently on Xarelto and Brilinta. At high risk of bleeding. Ideally would switch to Eliquis and Plavix. Will d/w interventional team to see if they prefer Brilinta due to the amount of stents that were recently placed  2. CAD s/p recent MI with multiple stents in 3/21 (RCA x 2, LCX x 3, LAD x 1) - No anginal symptoms currently - antiplatelet discussion as above. Off statin for now with elevated LFTs. Can restart in future  3.  Ischemic cardiomyopathy/chronic systolic heart failure - Most recent echocardiogram May 14, 2019 read as EF at 30% (I suspect closer to 35-40%0 with severe ischemic MR - Not on guideline directed medical therapy secondary to soft blood pressure - volume status ok. Will give him lasix to use as needed - started on midodrine for BP support  - Continue low-dose carvedilol as tolerated  4.  Severe mitral regurgitation -Will need further evaluation. May be possible MitraClip candidate depending on lymphoma course  5.  Recently diagnosed high-grade lymphoma - extensive retroperitoneal, pelvic, and inguinal lymphadenopathy.  There is also involvement of the spleen and near complete infiltration of the right kidney.  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   Suspicious for infiltrating disease of the right iliac and left pubic bones.  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 Alen Blew tomorrow  - this is major issue currently and driving most of his symptoms. I spoke with Dr. Alen Blew today and we will touch base after he sees him tomorrow to help coordinate his treatment plan. I think his heart disease is very treatable and would not let this get in the way of his lymphoma treatmenet  6.  Seizure disorder -Per admitting team  7.  Elevated liver enzyme -resolved  Glori Bickers, MD  1:40 PM

## 2019-05-30 ENCOUNTER — Telehealth: Payer: Self-pay

## 2019-05-30 ENCOUNTER — Other Ambulatory Visit: Payer: Self-pay

## 2019-05-30 ENCOUNTER — Telehealth: Payer: Self-pay | Admitting: Oncology

## 2019-05-30 ENCOUNTER — Inpatient Hospital Stay: Payer: No Typology Code available for payment source | Attending: Oncology | Admitting: Oncology

## 2019-05-30 VITALS — BP 87/62 | HR 103 | Temp 98.2°F | Resp 20

## 2019-05-30 DIAGNOSIS — C8299 Follicular lymphoma, unspecified, extranodal and solid organ sites: Secondary | ICD-10-CM | POA: Diagnosis not present

## 2019-05-30 DIAGNOSIS — R590 Localized enlarged lymph nodes: Secondary | ICD-10-CM | POA: Diagnosis not present

## 2019-05-30 DIAGNOSIS — I4891 Unspecified atrial fibrillation: Secondary | ICD-10-CM | POA: Diagnosis not present

## 2019-05-30 DIAGNOSIS — Z5112 Encounter for antineoplastic immunotherapy: Secondary | ICD-10-CM | POA: Insufficient documentation

## 2019-05-30 DIAGNOSIS — I509 Heart failure, unspecified: Secondary | ICD-10-CM | POA: Insufficient documentation

## 2019-05-30 DIAGNOSIS — E883 Tumor lysis syndrome: Secondary | ICD-10-CM | POA: Diagnosis not present

## 2019-05-30 DIAGNOSIS — Z79899 Other long term (current) drug therapy: Secondary | ICD-10-CM | POA: Insufficient documentation

## 2019-05-30 DIAGNOSIS — K8020 Calculus of gallbladder without cholecystitis without obstruction: Secondary | ICD-10-CM | POA: Diagnosis not present

## 2019-05-30 DIAGNOSIS — Z5111 Encounter for antineoplastic chemotherapy: Secondary | ICD-10-CM | POA: Insufficient documentation

## 2019-05-30 DIAGNOSIS — R5381 Other malaise: Secondary | ICD-10-CM | POA: Diagnosis not present

## 2019-05-30 DIAGNOSIS — Z7901 Long term (current) use of anticoagulants: Secondary | ICD-10-CM | POA: Insufficient documentation

## 2019-05-30 DIAGNOSIS — C8330 Diffuse large B-cell lymphoma, unspecified site: Secondary | ICD-10-CM | POA: Insufficient documentation

## 2019-05-30 DIAGNOSIS — J90 Pleural effusion, not elsewhere classified: Secondary | ICD-10-CM | POA: Insufficient documentation

## 2019-05-30 MED ORDER — PROCHLORPERAZINE MALEATE 10 MG PO TABS: 10.0000 mg | ORAL_TABLET | Freq: Four times a day (QID) | ORAL | 0 refills | Status: AC | PRN

## 2019-05-30 MED ORDER — LIDOCAINE-PRILOCAINE 2.5-2.5 % EX CREA
1.0000 | TOPICAL_CREAM | CUTANEOUS | 0 refills | Status: AC | PRN
Start: 2019-05-30 — End: ?

## 2019-05-30 MED ORDER — PREDNISONE 50 MG PO TABS: ORAL_TABLET | ORAL | 1 refills | Status: AC

## 2019-05-30 NOTE — Telephone Encounter (Signed)
Spoke to patient's wife and made her aware that patient is scheduled for PICC line placement on Tuesday 06/05/19 at 8:00 am. She verbalized understanding.

## 2019-05-30 NOTE — Progress Notes (Signed)
START ON PATHWAY REGIMEN - Lymphoma and CLL     A cycle is every 21 days:     Prednisone      Rituximab-xxxx      Cyclophosphamide      Doxorubicin      Vincristine   **Always confirm dose/schedule in your pharmacy ordering system**  Patient Characteristics: Diffuse Large B-Cell Lymphoma or Follicular Lymphoma, Grade 3B, First Line, Stage III and IV Disease Type: Not Applicable Disease Type: Diffuse Large B-Cell Lymphoma Disease Type: Not Applicable Line of therapy: First Line [place] Stage: IV Intent of Therapy: Curative Intent, Discussed with Patient 

## 2019-05-30 NOTE — Progress Notes (Signed)
Hematology and Oncology Follow Up Visit  Tony Saunders [id number] Mar 09, 1955 64 y.o. 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  He presented diffuse lymphadenopathy and lower extremity edema.   Prior Therapy:  He is status post ultrasound guided core biopsy of the left inguinal nodal mass completed on May 24, 2019.   Current therapy: Under evaluation to start therapy.  Interim History: [name] presents today for a follow-up from his recent hospitalization.  He is a pleasant 64 year old man with coronary artery disease hospitalized on May 4 of 2021 after presenting with pain in his lower swe extremity lling and and found to have atrial flutter with rapid ventricular response.  He did not have a deep vein thrombosis but CT scan of the abdomen and pelvis showed diffuse adenopathy with involvement and spleen indicating high-grade lymphoma.  Core biopsy obtained on May 6 of 2021 which confirmed the presence of diffuse large cell lymphoma.  He was evaluated by Dr. Haroldine Laws on May 29, 2019 for his heart failure and atrial fibrillation.  He was started on amiodarone and remains on Xarelto.   Clinically, he reports overall quite fatigue and debilitation with mobility.  Continues to have left leg edema as well as scrotal edema.  Denies any nausea, vomiting or abdominal pain.  Denies any chest pain or palpitation.  He denies any difficulty breathing.  He is able to swallow and eat reasonably well.lost some weight.      Medications: I have reviewed the patient's current medications.  Current Outpatient Medications  Medication Sig Dispense Refill  . amiodarone (PACERONE) 200 MG tablet Take 1 tablet (200 mg total) by mouth 2 (two) times daily. 60 tablet 3  . carvedilol (COREG) 6.25 MG tablet Take 0.5 tablets (3.125 mg total) by mouth 2 (two) times daily with a meal. 60  tablet 11  . furosemide (LASIX) 40 MG tablet Take 1 tablet (40 mg total) by mouth daily as needed for fluid. 30 tablet 3  . lamoTRIgine (LAMICTAL) 100 MG tablet Take 1 tablet (100 mg total) by mouth 2 (two) times daily. 180 tablet 3  . levETIRAcetam (KEPPRA) 750 MG tablet Take 1 tablet (750 mg total) by mouth 2 (two) times daily. 180 tablet 3  . midodrine (PROAMATINE) 2.5 MG tablet Take 1 tablet (2.5 mg total) by mouth 3 (three) times daily with meals. 90 tablet 3  . nitroGLYCERIN (NITROSTAT) 0.4 MG SL tablet Place 1 tablet (0.4 mg total) under the tongue every 5 (five) minutes as needed for chest pain. (Patient not taking: Reported on 05/29/2019) 25 tablet 4  . ondansetron (ZOFRAN) 4 MG tablet Take 1 tablet (4 mg total) by mouth every 6 (six) hours as needed for nausea. 20 tablet 0  . pantoprazole (PROTONIX) 40 MG tablet Take 1 tablet (40 mg total) by mouth daily. (Patient taking differently: Take 40 mg by mouth daily as needed (for severe nausea). ) 30 tablet 3  . potassium chloride SA (KLOR-CON) 20 MEQ tablet Take 1 tablet (20 mEq total) by mouth daily as needed (when you take Furosemide). 30 tablet 3  . rivaroxaban (XARELTO) 20 MG TABS tablet Take 1 tablet (20 mg total) by mouth daily with supper. (Patient taking differently: Take 20 mg by mouth daily at 8 pm. ) 30 tablet 11  . rosuvastatin (CRESTOR) 5 MG tablet Take 1 tablet (5 mg total) by mouth daily. (Patient taking differently: Take  5 mg by mouth at bedtime. ) 30 tablet 6  . sertraline (ZOLOFT) 25 MG tablet Take 1 tablet (25 mg total) by mouth daily. 90 tablet 3  . ticagrelor (BRILINTA) 90 MG TABS tablet Take 1 tablet (90 mg total) by mouth 2 (two) times daily. 180 tablet 3   No current facility-administered medications for this visit.     Allergies: No Known Allergies    Physical Exam: Blood pressure (!) 87/62, pulse (!) 103, temperature 98.2 F (36.8 C), temperature source Temporal, resp. rate 20, SpO2 98 %.   ECOG:  2 General  appearance: alert and cooperative Head: Normocephalic, without obvious abnormality Oropharynx: No oral thrush or ulcers. Eyes: No scleral icterus.  Pupils are equal and round reactive to light. Lymph nodes: Cervical, supraclavicular, and axillary nodes normal. Heart: irregular.  Lower extremity edema 2+ on the left. Lung:chest clear, no wheezing, rales, normal symmetric air entry Abdomin: soft, non-tender, without masses or organomegaly. Neurological: No motor, sensory deficits.  Intact deep tendon reflexes. Skin: No rashes or lesions.  No ecchymosis or petechiae. Musculoskeletal: No joint deformity or effusion.    Lab Results: Lab Results  Component Value Date   WBC 11.5 (H) 05/25/2019   HGB 8.9 (L) 05/25/2019   HCT 28.9 (L) 05/25/2019   MCV 84.3 05/25/2019   PLT 263 05/25/2019     Chemistry      Component Value Date/Time   NA 135 05/23/2019 0418   NA 134 04/16/2019 1215   K 3.7 05/23/2019 0418   CL 98 05/23/2019 0418   CO2 23 05/23/2019 0418   BUN 15 05/23/2019 0418   BUN 14 04/16/2019 1215   CREATININE 1.20 05/23/2019 0418      Component Value Date/Time   CALCIUM 8.0 (L) 05/23/2019 0418   ALKPHOS 66 05/23/2019 0418   AST 29 05/23/2019 0418   ALT 23 05/23/2019 0418   BILITOT 0.6 05/23/2019 0418   BILITOT 0.8 04/16/2019 1215      IMPRESSION: 1. 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 Extensive retroperitoneal, pelvic and inguinal lymphadenopathy along with involvement of the spleen and near complete infiltration of the right kidney. 2. Probable occlusions/infiltration of the right renal vein. 3. Findings suspicious for infiltrating bone disease mainly involving the right iliac bone and the left pubic bone. 4. Moderate-sized bilateral pleural effusions with overlying atelectasis. 5. Cholelithiasis. 6. Age advanced atherosclerotic    Impression and Plan:   64 year old with:  1.  Diffuse large cell lymphoma presented with  extensive left inguinal adenopathy measuring 8.7 x 7.2 cm in addition to involvement of the spleen and infiltration of the right kidney.  This was a biopsy proven to confirm the diagnosis on May 24, 2019.  The natural course of this disease was reviewed today with the patient and his wife.  His daughter was also available via FaceTime.  This represents a aggressive form of lymphoma that will require aggressive treatment and attempt to treat his disease that is potentially curable.  This will require multi agent systemic chemotherapy utilizing Adriamycin, cyclophosphamide, vincristine, prednisone and rituximab (CHOP-R).   Complication associated with discharge include nausea, vomiting, myelosuppression, neutropenia, neutropenic sepsis and infusion related complications rituximab.  Complication associated with prednisone clinic hyperglycemia palpitation and Zaidi.  Associated complication with Adriamycin clinic cardiac complications were reviewed specifically.  Cardiomyopathy and worsening arrhythmia could be a possibility.  After discussion today he is agreeable to proceed.  He understands that although this disease is curable it would require aggressive measures and  potential complication are high.  He understands he might not achieve remission or cure from his disease and might succumb to this disease or complications related to it.  I will arrange for education class in the immediate future to start chemotherapy as soon as possible.  2.  IV access: Risks and benefits of a Port-A-Cath insertion versus a PICC line were reviewed.  To expedite his chemotherapy infusion and to not to hold his anticoagulation will proceed with a PICC line insertion in the immediate future.  3.  Antiemetics: Zofran is available to him and I will add Compazine as well.  4.  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  We will start on allopurinol prophylactically.  5.  Cardiac considerations: Does  have coronary disease and currently in A. fib with cardiomyopathy.  This was discussed with Dr. Haroldine Laws and felt that anthracycline would be reasonable with his EF likely above 40% time.  6.  Left lower extremity edema: Related to her left inguinal adenopathy.  I will make an urgent referral for radiation for possible palliative radiation to chemotherapy has commenced and taken full effect.  7.  Prognosis: This disease is treatable and potentially curable.  He has a guarded prognosis because of his underlying other comorbid condition as well as the impact of the cancer on his body at this time.  Aggressive measures are warranted however and he is ready to proceed.  8.  Follow-up: Will be in the immediate future to start therapy.   40  minutes were dedicated to this visit. The time was spent on reviewing laboratory data, imaging studies, discussing treatment options, discussing differential diagnosis and answering questions regarding future plan.    Zola Button, MD 5/12/202111:39 AM

## 2019-05-30 NOTE — Telephone Encounter (Signed)
Scheduled appt per 5/12 sch message - pt wife is aware of appt date and time

## 2019-05-31 NOTE — Progress Notes (Signed)
Pharmacist Chemotherapy Monitoring - Initial Assessment    Anticipated start date: 06/06/2019   Regimen:  . Are orders appropriate based on the patient's diagnosis, regimen, and cycle? Yes . Does the plan date match the patient's scheduled date? Yes . Is the sequencing of drugs appropriate? Yes . Are the premedications appropriate for the patient's regimen? Yes . Prior Authorization for treatment is: Not Started o If applicable, is the correct biosimilar selected based on the patient's insurance? yes  Organ Function and Labs: Marland Kitchen Are dose adjustments needed based on the patient's renal function, hepatic function, or hematologic function? Yes . Are appropriate labs ordered prior to the start of patient's treatment? Yes . Other organ system assessment, if indicated: doxorubicin liposomal: Echo/ MUGA . The following baseline labs, if indicated, have been ordered: N/A  Dose Assessment: . Are the drug doses appropriate? Yes . Are the following correct: o Drug concentrations Yes o IV fluid compatible with drug Yes o Administration routes Yes o Timing of therapy Yes . If applicable, does the patient have documented access for treatment and/or plans for port-a-cath placement? yes . If applicable, have lifetime cumulative doses been properly documented and assessed? no Lifetime Dose Tracking  No doses have been documented on this patient for the following tracked chemicals: Doxorubicin, Epirubicin, Idarubicin, Daunorubicin, Mitoxantrone, Bleomycin, Oxaliplatin, Carboplatin, Liposomal Doxorubicin  o   Toxicity Monitoring/Prevention: . The patient has the following take home antiemetics prescribed: Ondansetron . The patient has the following take home medications prescribed: N/A . Medication allergies and previous infusion related reactions, if applicable, have been reviewed and addressed. No . The patient's current medication list has been assessed for drug-drug interactions with their  chemotherapy regimen. no significant drug-drug interactions were identified on review.  Order Review: . Are the treatment plan orders signed? Yes . Is the patient scheduled to see a provider prior to their treatment? Yes  I verify that I have reviewed each item in the above checklist and answered each question accordingly.  Nishant,Radford Pease D 05/31/2019 11:36 AM

## 2019-06-01 ENCOUNTER — Inpatient Hospital Stay: Payer: No Typology Code available for payment source

## 2019-06-01 ENCOUNTER — Other Ambulatory Visit: Payer: Self-pay | Admitting: Oncology

## 2019-06-01 ENCOUNTER — Encounter: Payer: Self-pay | Admitting: Oncology

## 2019-06-01 ENCOUNTER — Other Ambulatory Visit: Payer: Self-pay

## 2019-06-01 MED ORDER — ALLOPURINOL 300 MG PO TABS
300.0000 mg | ORAL_TABLET | Freq: Every day | ORAL | 0 refills | Status: DC
Start: 2019-06-01 — End: 2019-07-27

## 2019-06-01 NOTE — Progress Notes (Signed)
Met with patient/family member to introduce myself as Arboriculturist and to offer available resources.  Discussed one-time $1000 [employer] grant to assist with personal expenses while going through treatment.  Gave my card if interested in applying and for any additional financial questions or concerns. Based on maximum gross income, she states they do not qualify.

## 2019-06-05 ENCOUNTER — Other Ambulatory Visit: Payer: Self-pay

## 2019-06-05 ENCOUNTER — Other Ambulatory Visit: Payer: Self-pay | Admitting: Oncology

## 2019-06-05 ENCOUNTER — Ambulatory Visit (HOSPITAL_COMMUNITY)
Admission: RE | Admit: 2019-06-05 | Discharge: 2019-06-05 | Disposition: A | Discharge status: Home / Self Care | Payer: No Typology Code available for payment source | Source: Ambulatory Visit | Attending: Oncology | Admitting: Oncology

## 2019-06-05 DIAGNOSIS — C8299 Follicular lymphoma, unspecified, extranodal and solid organ sites: Secondary | ICD-10-CM | POA: Diagnosis not present

## 2019-06-05 HISTORY — PX: IR FLUORO GUIDE CV LINE RIGHT: IMG2283

## 2019-06-05 MED ORDER — LIDOCAINE HCL 1 % IJ SOLN
INTRAMUSCULAR | Status: AC
  Filled 2019-06-05: qty 20

## 2019-06-05 MED ORDER — HEPARIN SOD (PORK) LOCK FLUSH 100 UNIT/ML IV SOLN
INTRAVENOUS | Status: AC
  Filled 2019-06-05: qty 5

## 2019-06-05 NOTE — Procedures (Signed)
Pre procedural Diagnosis: Poor venous access Post Procedural Diagnosis: Same  Successful placement of right brachial vein approach 40 cm dual lumen PICC line with tip at the superior caval-atrial junction.    EBL: None No immediate post procedural complication.  The PICC line is ready for immediate use.  Jay Zyheir Daft, MD Pager #: [phone]   

## 2019-06-06 ENCOUNTER — Inpatient Hospital Stay: Payer: No Typology Code available for payment source

## 2019-06-06 ENCOUNTER — Other Ambulatory Visit: Payer: Self-pay

## 2019-06-06 VITALS — BP 84/63 | HR 94 | Temp 97.7°F | Resp 19

## 2019-06-06 DIAGNOSIS — Z95828 Presence of other vascular implants and grafts: Secondary | ICD-10-CM

## 2019-06-06 DIAGNOSIS — E883 Tumor lysis syndrome: Secondary | ICD-10-CM | POA: Diagnosis not present

## 2019-06-06 DIAGNOSIS — K8020 Calculus of gallbladder without cholecystitis without obstruction: Secondary | ICD-10-CM | POA: Diagnosis not present

## 2019-06-06 DIAGNOSIS — Z79899 Other long term (current) drug therapy: Secondary | ICD-10-CM | POA: Diagnosis not present

## 2019-06-06 DIAGNOSIS — C8330 Diffuse large B-cell lymphoma, unspecified site: Secondary | ICD-10-CM | POA: Diagnosis present

## 2019-06-06 DIAGNOSIS — Z5112 Encounter for antineoplastic immunotherapy: Secondary | ICD-10-CM | POA: Diagnosis not present

## 2019-06-06 DIAGNOSIS — Z5111 Encounter for antineoplastic chemotherapy: Secondary | ICD-10-CM | POA: Diagnosis not present

## 2019-06-06 DIAGNOSIS — J90 Pleural effusion, not elsewhere classified: Secondary | ICD-10-CM | POA: Diagnosis not present

## 2019-06-06 DIAGNOSIS — C8299 Follicular lymphoma, unspecified, extranodal and solid organ sites: Secondary | ICD-10-CM

## 2019-06-06 DIAGNOSIS — I4891 Unspecified atrial fibrillation: Secondary | ICD-10-CM | POA: Diagnosis not present

## 2019-06-06 DIAGNOSIS — R5381 Other malaise: Secondary | ICD-10-CM | POA: Diagnosis not present

## 2019-06-06 DIAGNOSIS — I509 Heart failure, unspecified: Secondary | ICD-10-CM | POA: Diagnosis not present

## 2019-06-06 DIAGNOSIS — Z7901 Long term (current) use of anticoagulants: Secondary | ICD-10-CM | POA: Diagnosis not present

## 2019-06-06 DIAGNOSIS — R590 Localized enlarged lymph nodes: Secondary | ICD-10-CM | POA: Diagnosis not present

## 2019-06-06 LAB — CBC WITH DIFFERENTIAL (CANCER CENTER ONLY)
Abs Immature Granulocytes: 0.35 10*3/uL — ABNORMAL HIGH (ref 0.00–0.07)
Basophils Absolute: 0.1 10*3/uL (ref 0.0–0.1)
Basophils Relative: 1 %
Eosinophils Absolute: 0.2 10*3/uL (ref 0.0–0.5)
Eosinophils Relative: 1 %
HCT: 34.1 % — ABNORMAL LOW (ref 39.0–52.0)
Hemoglobin: 10.3 g/dL — ABNORMAL LOW (ref 13.0–17.0)
Immature Granulocytes: 2 %
Lymphocytes Relative: 7 %
Lymphs Abs: 1.1 10*3/uL (ref 0.7–4.0)
MCH: 24.8 pg — ABNORMAL LOW (ref 26.0–34.0)
MCHC: 30.2 g/dL (ref 30.0–36.0)
MCV: 82 fL (ref 80.0–100.0)
Monocytes Absolute: 0.9 10*3/uL (ref 0.1–1.0)
Monocytes Relative: 6 %
Neutro Abs: 12.7 10*3/uL — ABNORMAL HIGH (ref 1.7–7.7)
Neutrophils Relative %: 83 %
Platelet Count: 389 10*3/uL (ref 150–400)
RBC: 4.16 MIL/uL — ABNORMAL LOW (ref 4.22–5.81)
RDW: 18.1 % — ABNORMAL HIGH (ref 11.5–15.5)
WBC Count: 15.3 10*3/uL — ABNORMAL HIGH (ref 4.0–10.5)
nRBC: 0 % (ref 0.0–0.2)

## 2019-06-06 LAB — HEPATITIS B SURFACE ANTIGEN: Hepatitis B Surface Ag: NONREACTIVE

## 2019-06-06 LAB — CMP (CANCER CENTER ONLY)
ALT: 15 U/L (ref 0–44)
AST: 26 U/L (ref 15–41)
Albumin: 1.9 g/dL — ABNORMAL LOW (ref 3.5–5.0)
Alkaline Phosphatase: 86 U/L (ref 38–126)
Anion gap: 12 (ref 5–15)
BUN: 19 mg/dL (ref 8–23)
CO2: 26 mmol/L (ref 22–32)
Calcium: 8.3 mg/dL — ABNORMAL LOW (ref 8.9–10.3)
Chloride: 97 mmol/L — ABNORMAL LOW (ref 98–111)
Creatinine: 1.18 mg/dL (ref 0.61–1.24)
GFR, Est AFR Am: >60 mL/min (ref >60)
GFR, Estimated: >60 mL/min (ref >60)
Glucose, Bld: 96 mg/dL (ref 70–99)
Potassium: 4.6 mmol/L (ref 3.5–5.1)
Sodium: 135 mmol/L (ref 135–145)
Total Bilirubin: 0.6 mg/dL (ref 0.3–1.2)
Total Protein: 4.9 g/dL — ABNORMAL LOW (ref 6.5–8.1)

## 2019-06-06 LAB — HEPATITIS B CORE ANTIBODY, TOTAL: Hep B Core Total Ab: NONREACTIVE

## 2019-06-06 MED ORDER — ACETAMINOPHEN 325 MG PO TABS
ORAL_TABLET | ORAL | Status: AC
  Filled 2019-06-06: qty 2

## 2019-06-06 MED ORDER — SODIUM CHLORIDE 0.9% FLUSH
3.0000 mL | INTRAVENOUS | Status: DC | PRN
  Administered 2019-06-06: 3 mL
  Filled 2019-06-06: qty 10

## 2019-06-06 MED ORDER — SODIUM CHLORIDE 0.9 % IV SOLN
375.0000 mg/m2 | Freq: Once | INTRAVENOUS | Status: AC
  Administered 2019-06-06: 800 mg via INTRAVENOUS
  Filled 2019-06-06: qty 50

## 2019-06-06 MED ORDER — ACETAMINOPHEN 325 MG PO TABS
650.0000 mg | ORAL_TABLET | Freq: Once | ORAL | Status: AC
  Administered 2019-06-06: 650 mg via ORAL

## 2019-06-06 MED ORDER — VINCRISTINE SULFATE CHEMO INJECTION 1 MG/ML
2.0000 mg | Freq: Once | INTRAVENOUS | Status: AC
  Administered 2019-06-06: 2 mg via INTRAVENOUS
  Filled 2019-06-06: qty 2

## 2019-06-06 MED ORDER — DIPHENHYDRAMINE HCL 25 MG PO CAPS
ORAL_CAPSULE | ORAL | Status: AC
  Filled 2019-06-06: qty 2

## 2019-06-06 MED ORDER — SODIUM CHLORIDE 0.9 % IV SOLN
750.0000 mg/m2 | Freq: Once | INTRAVENOUS | Status: AC
  Administered 2019-06-06: 1540 mg via INTRAVENOUS
  Filled 2019-06-06: qty 77

## 2019-06-06 MED ORDER — PALONOSETRON HCL INJECTION 0.25 MG/5ML
0.2500 mg | Freq: Once | INTRAVENOUS | Status: AC
  Administered 2019-06-06: 0.25 mg via INTRAVENOUS

## 2019-06-06 MED ORDER — SODIUM CHLORIDE 0.9 % IV SOLN
10.0000 mg | Freq: Once | INTRAVENOUS | Status: AC
  Administered 2019-06-06: 10 mg via INTRAVENOUS
  Filled 2019-06-06: qty 10

## 2019-06-06 MED ORDER — SODIUM CHLORIDE 0.9 % IV SOLN
150.0000 mg | Freq: Once | INTRAVENOUS | Status: AC
  Administered 2019-06-06: 150 mg via INTRAVENOUS
  Filled 2019-06-06: qty 150

## 2019-06-06 MED ORDER — DIPHENHYDRAMINE HCL 25 MG PO CAPS
50.0000 mg | ORAL_CAPSULE | Freq: Once | ORAL | Status: AC
  Administered 2019-06-06: 50 mg via ORAL

## 2019-06-06 MED ORDER — SODIUM CHLORIDE 0.9 % IV SOLN
Freq: Once | INTRAVENOUS | Status: AC
  Filled 2019-06-06: qty 250

## 2019-06-06 MED ORDER — DOXORUBICIN HCL CHEMO IV INJECTION 2 MG/ML
50.0000 mg/m2 | Freq: Once | INTRAVENOUS | Status: AC
  Administered 2019-06-06: 102 mg via INTRAVENOUS
  Filled 2019-06-06: qty 51

## 2019-06-06 MED ORDER — HEPARIN SOD (PORK) LOCK FLUSH 100 UNIT/ML IV SOLN
250.0000 [IU] | Freq: Once | INTRAVENOUS | Status: AC | PRN
  Administered 2019-06-06: 250 [IU]
  Filled 2019-06-06: qty 5

## 2019-06-06 MED ORDER — SODIUM CHLORIDE 0.9% FLUSH
10.0000 mL | INTRAVENOUS | Status: DC | PRN
  Administered 2019-06-06: 10 mL via INTRAVENOUS
  Filled 2019-06-06: qty 10

## 2019-06-06 MED ORDER — PALONOSETRON HCL INJECTION 0.25 MG/5ML
INTRAVENOUS | Status: AC
  Filled 2019-06-06: qty 5

## 2019-06-06 NOTE — Progress Notes (Signed)
Dr. Alen Blew ok'd proceeding w/ Ruxience w/ Hep B panel pending.  He clarified he would like full dose Adriamycin given communication w/ cardiology.  Discussed w/ Dr. Alen Blew possible interaction w/ Amiodarone & Cytoxan & he will monitor closely for pulmonary sxs (e.g. nonproductive cough, dyspnea, pleuritic pain, hemoptysis, wheezing, malaise)   Kennith Center, Pharm.D., CPP 06/06/2019@9 :48 AM

## 2019-06-06 NOTE — Patient Instructions (Signed)
[facility] Discharge Instructions for Patients Receiving Chemotherapy  Today you received the following chemotherapy agents: Adriamycin, Vincristine, Cytoxan, Rituxan  To help prevent nausea and vomiting after your treatment, we encourage you to take your nausea medication as directed.   If you develop nausea and vomiting that is not controlled by your nausea medication, call the clinic.   BELOW ARE SYMPTOMS THAT SHOULD BE REPORTED IMMEDIATELY:  *FEVER GREATER THAN 100.5 F  *CHILLS WITH OR WITHOUT FEVER  NAUSEA AND VOMITING THAT IS NOT CONTROLLED WITH YOUR NAUSEA MEDICATION  *UNUSUAL SHORTNESS OF BREATH  *UNUSUAL BRUISING OR BLEEDING  TENDERNESS IN MOUTH AND THROAT WITH OR WITHOUT PRESENCE OF ULCERS  *URINARY PROBLEMS  *BOWEL PROBLEMS  UNUSUAL RASH Items with * indicate a potential emergency and should be followed up as soon as possible.  Feel free to call the clinic should you have any questions or concerns. The clinic phone number is [phone].  Please show the [place] at check-in to the Emergency Department and triage nurse.  Doxorubicin injection What is this medicine? DOXORUBICIN (dox oh ROO bi sin) is a chemotherapy drug. It is used to treat many kinds of cancer like leukemia, lymphoma, neuroblastoma, sarcoma, and Wilms' tumor. It is also used to treat bladder cancer, breast cancer, lung cancer, ovarian cancer, stomach cancer, and thyroid cancer. This medicine may be used for other purposes; ask your health care provider or pharmacist if you have questions. COMMON BRAND NAME(S): Adriamycin, Adriamycin PFS, Adriamycin RDF, Rubex What should I tell my health care provider before I take this medicine? They need to know if you have any of these conditions:  heart disease  history of low blood counts caused by a medicine  liver disease  recent or ongoing radiation therapy  an unusual or allergic reaction to doxorubicin, other  chemotherapy agents, other medicines, foods, dyes, or preservatives  pregnant or trying to get pregnant  breast-feeding How should I use this medicine? This drug is given as an infusion into a vein. It is administered in a hospital or clinic by a specially trained health care professional. If you have pain, swelling, burning or any unusual feeling around the site of your injection, tell your health care professional right away. Talk to your pediatrician regarding the use of this medicine in children. Special care may be needed. Overdosage: If you think you have taken too much of this medicine contact a poison control center or emergency room at once. NOTE: This medicine is only for you. Do not share this medicine with others. What if I miss a dose? It is important not to miss your dose. Call your doctor or health care professional if you are unable to keep an appointment. What may interact with this medicine? This medicine may interact with the following medications:  6-mercaptopurine  paclitaxel  phenytoin  St. John's Wort  trastuzumab  verapamil This list may not describe all possible interactions. Give your health care provider a list of all the medicines, herbs, non-prescription drugs, or dietary supplements you use. Also tell them if you smoke, drink alcohol, or use illegal drugs. Some items may interact with your medicine. What should I watch for while using this medicine? This drug may make you feel generally unwell. This is not uncommon, as chemotherapy can affect healthy cells as well as cancer cells. Report any side effects. Continue your course of treatment even though you feel ill unless your doctor tells you to stop. There is a maximum  amount of this medicine you should receive throughout your life. The amount depends on the medical condition being treated and your overall health. Your doctor will watch how much of this medicine you receive in your lifetime. Tell your doctor  if you have taken this medicine before. You may need blood work done while you are taking this medicine. Your urine may turn red for a few days after your dose. This is not blood. If your urine is dark or brown, call your doctor. In some cases, you may be given additional medicines to help with side effects. Follow all directions for their use. Call your doctor or health care professional for advice if you get a fever, chills or sore throat, or other symptoms of a cold or flu. Do not treat yourself. This drug decreases your body's ability to fight infections. Try to avoid being around people who are sick. This medicine may increase your risk to bruise or bleed. Call your doctor or health care professional if you notice any unusual bleeding. Talk to your doctor about your risk of cancer. You may be more at risk for certain types of cancers if you take this medicine. Do not become pregnant while taking this medicine or for 6 months after stopping it. Women should inform their doctor if they wish to become pregnant or think they might be pregnant. Men should not father a child while taking this medicine and for 6 months after stopping it. There is a potential for serious side effects to an unborn child. Talk to your health care professional or pharmacist for more information. Do not breast-feed an infant while taking this medicine. This medicine has caused ovarian failure in some women and reduced sperm counts in some men This medicine may interfere with the ability to have a child. Talk with your doctor or health care professional if you are concerned about your fertility. This medicine may cause a decrease in Co-Enzyme Q-10. You should make sure that you get enough Co-Enzyme Q-10 while you are taking this medicine. Discuss the foods you eat and the vitamins you take with your health care professional. What side effects may I notice from receiving this medicine? Side effects that you should report to your  doctor or health care professional as soon as possible:  allergic reactions like skin rash, itching or hives, swelling of the face, lips, or tongue  breathing problems  chest pain  fast or irregular heartbeat  low blood counts - this medicine may decrease the number of white blood cells, red blood cells and platelets. You may be at increased risk for infections and bleeding.  pain, redness, or irritation at site where injected  signs of infection - fever or chills, cough, sore throat, pain or difficulty passing urine  signs of decreased platelets or bleeding - bruising, pinpoint red spots on the skin, black, tarry stools, blood in the urine  swelling of the ankles, feet, hands  tiredness  weakness Side effects that usually do not require medical attention (report to your doctor or health care professional if they continue or are bothersome):  diarrhea  hair loss  mouth sores  nail discoloration or damage  nausea  red colored urine  vomiting This list may not describe all possible side effects. Call your doctor for medical advice about side effects. You may report side effects to FDA at 1-800-FDA-1088. Where should I keep my medicine? This drug is given in a hospital or clinic and will not be stored at  home. NOTE: This sheet is a summary. It may not cover all possible information. If you have questions about this medicine, talk to your doctor, pharmacist, or health care provider.  2020 Elsevier/Gold Standard (2016-08-18 11:01:26)  Vincristine injection What is this medicine? VINCRISTINE (vin KRIS teen) is a chemotherapy drug. It slows the growth of cancer cells. This medicine is used to treat many types of cancer like Hodgkin's disease, leukemia, non-Hodgkin's lymphoma, neuroblastoma (brain cancer), rhabdomyosarcoma, and Wilms' tumor. This medicine may be used for other purposes; ask your health care provider or pharmacist if you have questions. COMMON BRAND NAME(S):  Oncovin, Vincasar PFS What should I tell my health care provider before I take this medicine? They need to know if you have any of these conditions:  blood disorders  gout  infection (especially chickenpox, cold sores, or herpes)  kidney disease  liver disease  lung disease  nervous system disease like Charcot-Marie-Tooth (CMT)  recent or ongoing radiation therapy  an unusual or allergic reaction to vincristine, other chemotherapy agents, other medicines, foods, dyes, or preservatives  pregnant or trying to get pregnant  breast-feeding How should I use this medicine? This drug is given as an infusion into a vein. It is administered in a hospital or clinic by a specially trained health care professional. If you have pain, swelling, burning, or any unusual feeling around the site of your injection, tell your health care professional right away. Talk to your pediatrician regarding the use of this medicine in children. While this drug may be prescribed for selected conditions, precautions do apply. Overdosage: If you think you have taken too much of this medicine contact a poison control center or emergency room at once. NOTE: This medicine is only for you. Do not share this medicine with others. What if I miss a dose? It is important not to miss your dose. Call your doctor or health care professional if you are unable to keep an appointment. What may interact with this medicine? Do not take this medicine with any of the following medications:  itraconazole  mibefradil  voriconazole This medicine may also interact with the following medications:  cyclosporine  erythromycin  fluconazole  ketoconazole  medicines for HIV like delavirdine, efavirenz, nevirapine  medicines for seizures like ethotoin, fosphenotoin, phenytoin  medicines to increase blood counts like filgrastim, pegfilgrastim, sargramostim  other chemotherapy drugs like cisplatin, L-asparaginase,  methotrexate, mitomycin, paclitaxel  pegaspargase  vaccines  zalcitabine, ddC Talk to your doctor or health care professional before taking any of these medicines:  acetaminophen  aspirin  ibuprofen  ketoprofen  naproxen This list may not describe all possible interactions. Give your health care provider a list of all the medicines, herbs, non-prescription drugs, or dietary supplements you use. Also tell them if you smoke, drink alcohol, or use illegal drugs. Some items may interact with your medicine. What should I watch for while using this medicine? This drug may make you feel generally unwell. This is not uncommon, as chemotherapy can affect healthy cells as well as cancer cells. Report any side effects. Continue your course of treatment even though you feel ill unless your doctor tells you to stop. You may need blood work done while you are taking this medicine. This medicine will cause constipation. Try to have a bowel movement at least every 2 to 3 days. If you do not have a bowel movement for 3 days, call your doctor or health care professional. In some cases, you may be given additional medicines to  help with side effects. Follow all directions for their use. Do not become pregnant while taking this medicine. Women should inform their doctor if they wish to become pregnant or think they might be pregnant. There is a potential for serious side effects to an unborn child. Talk to your health care professional or pharmacist for more information. Do not breast-feed an infant while taking this medicine. This medicine may make it more difficult to get pregnant or to father a child. Talk to your healthcare professional if you are concerned about your fertility. What side effects may I notice from receiving this medicine? Side effects that you should report to your doctor or health care professional as soon as possible:  allergic reactions like skin rash, itching or hives, swelling of  the face, lips, or tongue  breathing problems  confusion or changes in emotions or moods  constipation  cough  mouth sores  muscle weakness  nausea and vomiting  pain, swelling, redness or irritation at the injection site  pain, tingling, numbness in the hands or feet  problems with balance, talking, walking  seizures  stomach pain  trouble passing urine or change in the amount of urine Side effects that usually do not require medical attention (report to your doctor or health care professional if they continue or are bothersome):  diarrhea  hair loss  jaw pain  loss of appetite This list may not describe all possible side effects. Call your doctor for medical advice about side effects. You may report side effects to FDA at 1-800-FDA-1088. Where should I keep my medicine? This drug is given in a hospital or clinic and will not be stored at home. NOTE: This sheet is a summary. It may not cover all possible information. If you have questions about this medicine, talk to your doctor, pharmacist, or health care provider.  2020 Elsevier/Gold Standard (2017-10-07 08:55:02)  Cyclophosphamide Injection What is this medicine? CYCLOPHOSPHAMIDE (sye kloe FOSS fa mide) is a chemotherapy drug. It slows the growth of cancer cells. This medicine is used to treat many types of cancer like lymphoma, myeloma, leukemia, breast cancer, and ovarian cancer, to name a few. This medicine may be used for other purposes; ask your health care provider or pharmacist if you have questions. COMMON BRAND NAME(S): Cytoxan, Neosar What should I tell my health care provider before I take this medicine? They need to know if you have any of these conditions:  heart disease  history of irregular heartbeat  infection  kidney disease  liver disease  low blood counts, like white cells, platelets, or red blood cells  on hemodialysis  recent or ongoing radiation therapy  scarring or  thickening of the lungs  trouble passing urine  an unusual or allergic reaction to cyclophosphamide, other medicines, foods, dyes, or preservatives  pregnant or trying to get pregnant  breast-feeding How should I use this medicine? This drug is usually given as an injection into a vein or muscle or by infusion into a vein. It is administered in a hospital or clinic by a specially trained health care professional. Talk to your pediatrician regarding the use of this medicine in children. Special care may be needed. Overdosage: If you think you have taken too much of this medicine contact a poison control center or emergency room at once. NOTE: This medicine is only for you. Do not share this medicine with others. What if I miss a dose? It is important not to miss your dose. Call your doctor  or health care professional if you are unable to keep an appointment. What may interact with this medicine?  amphotericin B  azathioprine  certain antivirals for HIV or hepatitis  certain medicines for blood pressure, heart disease, irregular heart beat  certain medicines that treat or prevent blood clots like warfarin  certain other medicines for cancer  cyclosporine  etanercept  indomethacin  medicines that relax muscles for surgery  medicines to increase blood counts  metronidazole This list may not describe all possible interactions. Give your health care provider a list of all the medicines, herbs, non-prescription drugs, or dietary supplements you use. Also tell them if you smoke, drink alcohol, or use illegal drugs. Some items may interact with your medicine. What should I watch for while using this medicine? Your condition will be monitored carefully while you are receiving this medicine. You may need blood work done while you are taking this medicine. Drink water or other fluids as directed. Urinate often, even at night. Some products may contain alcohol. Ask your health care  professional if this medicine contains alcohol. Be sure to tell all health care professionals you are taking this medicine. Certain medicines, like metronidazole and disulfiram, can cause an unpleasant reaction when taken with alcohol. The reaction includes flushing, headache, nausea, vomiting, sweating, and increased thirst. The reaction can last from 30 minutes to several hours. Do not become pregnant while taking this medicine or for 1 year after stopping it. Women should inform their health care professional if they wish to become pregnant or think they might be pregnant. Men should not father a child while taking this medicine and for 4 months after stopping it. There is potential for serious side effects to an unborn child. Talk to your health care professional for more information. Do not breast-feed an infant while taking this medicine or for 1 week after stopping it. This medicine has caused ovarian failure in some women. This medicine may make it more difficult to get pregnant. Talk to your health care professional if you are concerned about your fertility. This medicine has caused decreased sperm counts in some men. This may make it more difficult to father a child. Talk to your health care professional if you are concerned about your fertility. Call your health care professional for advice if you get a fever, chills, or sore throat, or other symptoms of a cold or flu. Do not treat yourself. This medicine decreases your body's ability to fight infections. Try to avoid being around people who are sick. Avoid taking medicines that contain aspirin, acetaminophen, ibuprofen, naproxen, or ketoprofen unless instructed by your health care professional. These medicines may hide a fever. Talk to your health care professional about your risk of cancer. You may be more at risk for certain types of cancer if you take this medicine. If you are going to need surgery or other procedure, tell your health care  professional that you are using this medicine. Be careful brushing or flossing your teeth or using a toothpick because you may get an infection or bleed more easily. If you have any dental work done, tell your dentist you are receiving this medicine. What side effects may I notice from receiving this medicine? Side effects that you should report to your doctor or health care professional as soon as possible:  allergic reactions like skin rash, itching or hives, swelling of the face, lips, or tongue  breathing problems  nausea, vomiting  signs and symptoms of bleeding such as bloody  or black, tarry stools; red or dark brown urine; spitting up blood or brown material that looks like coffee grounds; red spots on the skin; unusual bruising or bleeding from the eyes, gums, or nose  signs and symptoms of heart failure like fast, irregular heartbeat, sudden weight gain; swelling of the ankles, feet, hands  signs and symptoms of infection like fever; chills; cough; sore throat; pain or trouble passing urine  signs and symptoms of kidney injury like trouble passing urine or change in the amount of urine  signs and symptoms of liver injury like dark yellow or brown urine; general ill feeling or flu-like symptoms; light-colored stools; loss of appetite; nausea; right upper belly pain; unusually weak or tired; yellowing of the eyes or skin Side effects that usually do not require medical attention (report to your doctor or health care professional if they continue or are bothersome):  confusion  decreased hearing  diarrhea  facial flushing  hair loss  headache  loss of appetite  missed menstrual periods  signs and symptoms of low red blood cells or anemia such as unusually weak or tired; feeling faint or lightheaded; falls  skin discoloration This list may not describe all possible side effects. Call your doctor for medical advice about side effects. You may report side effects to FDA at  1-800-FDA-1088. Where should I keep my medicine? This drug is given in a hospital or clinic and will not be stored at home. NOTE: This sheet is a summary. It may not cover all possible information. If you have questions about this medicine, talk to your doctor, pharmacist, or health care provider.  2020 Elsevier/Gold Standard (2018-10-09 09:53:29)  Rituximab injection What is this medicine? RITUXIMAB (ri TUX i mab) is a monoclonal antibody. It is used to treat certain types of cancer like non-Hodgkin lymphoma and chronic lymphocytic leukemia. It is also used to treat rheumatoid arthritis, granulomatosis with polyangiitis (or Wegener's granulomatosis), microscopic polyangiitis, and pemphigus vulgaris. This medicine may be used for other purposes; ask your health care provider or pharmacist if you have questions. COMMON BRAND NAME(S): Rituxan, RUXIENCE What should I tell my health care provider before I take this medicine? They need to know if you have any of these conditions:  heart disease  infection (especially a virus infection such as hepatitis B, chickenpox, cold sores, or herpes)  immune system problems  irregular heartbeat  kidney disease  low blood counts, like low white cell, platelet, or red cell counts  lung or breathing disease, like asthma  recently received or scheduled to receive a vaccine  an unusual or allergic reaction to rituximab, other medicines, foods, dyes, or preservatives  pregnant or trying to get pregnant  breast-feeding How should I use this medicine? This medicine is for infusion into a vein. It is administered in a hospital or clinic by a specially trained health care professional. A special MedGuide will be given to you by the pharmacist with each prescription and refill. Be sure to read this information carefully each time. Talk to your pediatrician regarding the use of this medicine in children. This medicine is not approved for use in  children. Overdosage: If you think you have taken too much of this medicine contact a poison control center or emergency room at once. NOTE: This medicine is only for you. Do not share this medicine with others. What if I miss a dose? It is important not to miss a dose. Call your doctor or health care professional if you are  unable to keep an appointment. What may interact with this medicine?  cisplatin  live virus vaccines This list may not describe all possible interactions. Give your health care provider a list of all the medicines, herbs, non-prescription drugs, or dietary supplements you use. Also tell them if you smoke, drink alcohol, or use illegal drugs. Some items may interact with your medicine. What should I watch for while using this medicine? Your condition will be monitored carefully while you are receiving this medicine. You may need blood work done while you are taking this medicine. This medicine can cause serious allergic reactions. To reduce your risk you may need to take medicine before treatment with this medicine. Take your medicine as directed. In some patients, this medicine may cause a serious brain infection that may cause death. If you have any problems seeing, thinking, speaking, walking, or standing, tell your healthcare professional right away. If you cannot reach your healthcare professional, urgently seek other source of medical care. Call your doctor or health care professional for advice if you get a fever, chills or sore throat, or other symptoms of a cold or flu. Do not treat yourself. This drug decreases your body's ability to fight infections. Try to avoid being around people who are sick. Do not become pregnant while taking this medicine or for at least 12 months after stopping it. Women should inform their doctor if they wish to become pregnant or think they might be pregnant. There is a potential for serious side effects to an unborn child. Talk to your health  care professional or pharmacist for more information. Do not breast-feed an infant while taking this medicine or for at least 6 months after stopping it. What side effects may I notice from receiving this medicine? Side effects that you should report to your doctor or health care professional as soon as possible:  allergic reactions like skin rash, itching or hives; swelling of the face, lips, or tongue  breathing problems  chest pain  changes in vision  diarrhea  headache with fever, neck stiffness, sensitivity to light, nausea, or confusion  fast, irregular heartbeat  loss of memory  low blood counts - this medicine may decrease the number of white blood cells, red blood cells and platelets. You may be at increased risk for infections and bleeding.  mouth sores  problems with balance, talking, or walking  redness, blistering, peeling or loosening of the skin, including inside the mouth  signs of infection - fever or chills, cough, sore throat, pain or difficulty passing urine  signs and symptoms of kidney injury like trouble passing urine or change in the amount of urine  signs and symptoms of liver injury like dark yellow or brown urine; general ill feeling or flu-like symptoms; light-colored stools; loss of appetite; nausea; right upper belly pain; unusually weak or tired; yellowing of the eyes or skin  signs and symptoms of low blood pressure like dizziness; feeling faint or lightheaded, falls; unusually weak or tired  stomach pain  swelling of the ankles, feet, hands  unusual bleeding or bruising  vomiting Side effects that usually do not require medical attention (report to your doctor or health care professional if they continue or are bothersome):  headache  joint pain  muscle cramps or muscle pain  nausea  tiredness This list may not describe all possible side effects. Call your doctor for medical advice about side effects. You may report side effects  to FDA at 1-800-FDA-1088. Where should I keep  my medicine? This drug is given in a hospital or clinic and will not be stored at home. NOTE: This sheet is a summary. It may not cover all possible information. If you have questions about this medicine, talk to your doctor, pharmacist, or health care provider.  2020 [employer] Standard (2018-02-15 22:01:36)

## 2019-06-07 ENCOUNTER — Telehealth: Payer: Self-pay | Admitting: *Deleted

## 2019-06-07 ENCOUNTER — Encounter: Payer: Self-pay | Admitting: Cardiology

## 2019-06-07 ENCOUNTER — Ambulatory Visit (INDEPENDENT_AMBULATORY_CARE_PROVIDER_SITE_OTHER): Payer: No Typology Code available for payment source | Admitting: Cardiology

## 2019-06-07 VITALS — BP 92/54 | HR 98 | Ht 72.0 in

## 2019-06-07 DIAGNOSIS — I2119 ST elevation (STEMI) myocardial infarction involving other coronary artery of inferior wall: Secondary | ICD-10-CM | POA: Diagnosis not present

## 2019-06-07 DIAGNOSIS — I480 Paroxysmal atrial fibrillation: Secondary | ICD-10-CM

## 2019-06-07 DIAGNOSIS — I2510 Atherosclerotic heart disease of native coronary artery without angina pectoris: Secondary | ICD-10-CM

## 2019-06-07 DIAGNOSIS — I5042 Chronic combined systolic (congestive) and diastolic (congestive) heart failure: Secondary | ICD-10-CM | POA: Diagnosis not present

## 2019-06-07 DIAGNOSIS — E785 Hyperlipidemia, unspecified: Secondary | ICD-10-CM

## 2019-06-07 DIAGNOSIS — I255 Ischemic cardiomyopathy: Secondary | ICD-10-CM

## 2019-06-07 DIAGNOSIS — C8335 Diffuse large B-cell lymphoma, lymph nodes of inguinal region and lower limb: Secondary | ICD-10-CM

## 2019-06-07 DIAGNOSIS — Z9861 Coronary angioplasty status: Secondary | ICD-10-CM

## 2019-06-07 DIAGNOSIS — I340 Nonrheumatic mitral (valve) insufficiency: Secondary | ICD-10-CM

## 2019-06-07 MED ORDER — CLOPIDOGREL BISULFATE 75 MG PO TABS
75.0000 mg | ORAL_TABLET | Freq: Every day | ORAL | 3 refills | Status: DC
Start: 2019-06-07 — End: 2019-09-09

## 2019-06-07 NOTE — Progress Notes (Signed)
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 He did not have a deep vein thrombosis but CT scan of the abdomen and pelvis showed diffuse adenopathy with involvement and spleen indicating high-grade lymphoma. Core biopsy obtained on May 6 of 2021 which confirmed the presence of diffuse large cell lymphoma  Biopsies revealed:  05/24/2019 FINAL MICROSCOPIC DIAGNOSIS:  A. LYMPH NODE, LEFT INGUINAL, BIOPSY:  - Non-Hodgkin B cell lymphoma, favor large B-cell lymphoma  - See comment  COMMENT:  The biopsy material consists of multiple cores of nodal tissue composed  of medium to large lymphocytes with irregular nuclear contours. By  immunohistochemistry, the large cells are positive for CD20, CD10, BCL6,  BCL2 and Mum-1 but negative for CD5 and CD30. There is no significant  plasma cell population by CD138 or kappa and lambda by in situ  hybridization. CD21 highlights patchy, retained follicular dendritic  meshworks. The proliferative rate by Ki-67 is up to 30% in some areas.  Flow cytometry performed on the sample identified a kappa restricted,  CD10 positive B-cell population that comprised 65% of all lymphocytes  (See [id number]).  Overall, the findings are consistent with a non-Hodgkin B-cell lymphoma  and a large B-cell lymphoma is favored. A high-grade/large B-cell  lymphoma FISH panel is pending and will be reported in an addendum  Past/Anticipated interventions by medical oncology, if any:  Under care of Dr. Zola Button 05/30/2019 This will require multi agent systemic chemotherapy utilizing Adriamycin, cyclophosphamide, vincristine, prednisone and rituximab (CHOP-R)--received first cycle 06/06/2019  Weight changes, if any, over the past 6  months: Losing weight without trying, but swelling in his left leg is so prominent that his body weight hasn't fluctuated much.   Recurrent fevers, or drenching night sweats, if any: None  SAFETY ISSUES:  Prior radiation? No  Pacemaker/ICD? No  Possible current pregnancy? N/A  Is the patient on methotrexate? No  Current Complaints / other details:  Patient's wife reports that the swelling in patient's leg is so cumbersome that patient is not able to bare weight/ambulate easily. He denies any pain, but wife reports that swollen area is very sensitive/tender to palpation. Clinically, overall patient reports feeling quite fatigued. He was evaluated by Dr. Haroldine Laws on May 29, 2019 for his heart failure and atrial fibrillation.  He was started on amiodarone and remains on Xarelto. Per wife patient is to finish Brilinta prescription and then switch to Plavix.

## 2019-06-07 NOTE — Progress Notes (Signed)
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 next appointment:   Virtual Visit   Provider:   Glenetta Hew, MD       Studies Ordered:   Orders Placed This Encounter  Procedures  . EKG 12-Lead     Tony Saunders, M.D., M.S. Interventional Cardiologist   Pager # [phone] Phone # [phone] [address] [place], [place] [address]   Thank you for choosing [facility] at [facility]!!

## 2019-06-07 NOTE — Patient Instructions (Addendum)
Medication Instructions:   CLOPIDOGREL 75 MG DAILY  COMPLETE YOUR BOTTLE OF [place], ONCE COMPLETED -START TAKING CLOPIDOGREL 75 MG - THE FIRST DAY TAKE 4 TABLETS ( EQUAL 300 MG ) , THEREAFTER TAKE 75 MG - ONE TABLET DAILY     IF YOUR BLOOD PRESSURE IS LESS THAN [id number]  SYSTOLIC ( TOP NUMBER) . DO NOT TAKE YOUR CARVEDILOL THAT DAY . IT IS OKAY IF YOU DO NOT TAKE IF BLOOD PRESSURE IS ELOW THE ABOVE NUMBER.  *If you need a refill on your cardiac medications before your next appointment, please call your pharmacy*   Lab Work: NOT NEEDED  Testing/Procedures: NOT NEEDED   Follow-Up: At [facility], you and your health needs are our priority.  As part of our continuing mission to provide you with exceptional heart care, we have created designated Provider Care Teams.  These Care Teams include your primary Cardiologist (physician) and Advanced Practice Providers (APPs -  Physician Assistants and Nurse Practitioners) who all work together to provide you with the care you need, when you need it.    Your next appointment:   6 month(s) NOV 2021  The format for your next appointment:   Virtual Visit   Provider:   Glenetta Hew, MD

## 2019-06-08 ENCOUNTER — Inpatient Hospital Stay: Payer: No Typology Code available for payment source

## 2019-06-08 ENCOUNTER — Ambulatory Visit
Admission: RE | Admit: 2019-06-08 | Discharge: 2019-06-08 | Disposition: A | Discharge status: Home / Self Care | Payer: No Typology Code available for payment source | Source: Ambulatory Visit | Attending: Radiation Oncology | Admitting: Radiation Oncology

## 2019-06-08 ENCOUNTER — Encounter: Payer: Self-pay | Admitting: Radiation Oncology

## 2019-06-08 ENCOUNTER — Telehealth: Payer: Self-pay | Admitting: *Deleted

## 2019-06-08 ENCOUNTER — Other Ambulatory Visit: Payer: Self-pay

## 2019-06-08 VITALS — BP 114/64 | HR 89 | Temp 97.9°F | Resp 18

## 2019-06-08 DIAGNOSIS — C8335 Diffuse large B-cell lymphoma, lymph nodes of inguinal region and lower limb: Secondary | ICD-10-CM

## 2019-06-08 DIAGNOSIS — Z5112 Encounter for antineoplastic immunotherapy: Secondary | ICD-10-CM | POA: Diagnosis not present

## 2019-06-08 DIAGNOSIS — C8299 Follicular lymphoma, unspecified, extranodal and solid organ sites: Secondary | ICD-10-CM

## 2019-06-08 MED ORDER — PEGFILGRASTIM-JMDB 6 MG/0.6ML ~~LOC~~ SOSY
6.0000 mg | PREFILLED_SYRINGE | Freq: Once | SUBCUTANEOUS | Status: AC
  Administered 2019-06-08: 6 mg via SUBCUTANEOUS

## 2019-06-08 MED ORDER — PEGFILGRASTIM-JMDB 6 MG/0.6ML ~~LOC~~ SOSY
PREFILLED_SYRINGE | SUBCUTANEOUS | Status: AC
  Filled 2019-06-08: qty 0.6

## 2019-06-08 NOTE — Telephone Encounter (Signed)
Patient wife called after hours line. Patient wife reports s husband needs a new PCP and has a 1st appt with Dr. Deniece Ree in June 23. 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 His movement is limited and he just started chemo. He is too weak to move and needs the handicap tag. She needs help getting orders for home care.

## 2019-06-08 NOTE — Telephone Encounter (Signed)
We can do handicap tag

## 2019-06-08 NOTE — Telephone Encounter (Signed)
Spoke with wife and handicap placard is ready to pick up.  A virtual appointment scheduled.  Wife will confirmed date and time.

## 2019-06-08 NOTE — Telephone Encounter (Signed)
Would you like an appointment before his new patient appointment?

## 2019-06-08 NOTE — Patient Instructions (Signed)

## 2019-06-11 ENCOUNTER — Inpatient Hospital Stay: Payer: No Typology Code available for payment source

## 2019-06-11 ENCOUNTER — Other Ambulatory Visit: Payer: Self-pay | Admitting: Radiation Oncology

## 2019-06-11 ENCOUNTER — Encounter: Payer: Self-pay | Admitting: Radiation Oncology

## 2019-06-11 ENCOUNTER — Telehealth: Payer: Self-pay

## 2019-06-11 ENCOUNTER — Other Ambulatory Visit: Payer: Self-pay

## 2019-06-11 DIAGNOSIS — C8335 Diffuse large B-cell lymphoma, lymph nodes of inguinal region and lower limb: Secondary | ICD-10-CM

## 2019-06-11 DIAGNOSIS — Z5112 Encounter for antineoplastic immunotherapy: Secondary | ICD-10-CM | POA: Diagnosis not present

## 2019-06-11 DIAGNOSIS — C8299 Follicular lymphoma, unspecified, extranodal and solid organ sites: Secondary | ICD-10-CM

## 2019-06-11 HISTORY — DX: Diffuse large b-cell lymphoma, lymph nodes of inguinal region and lower limb: C83.35

## 2019-06-11 MED ORDER — HEPARIN SOD (PORK) LOCK FLUSH 100 UNIT/ML IV SOLN
500.0000 [IU] | Freq: Once | INTRAVENOUS | Status: AC | PRN
  Administered 2019-06-11: 500 [IU]
  Filled 2019-06-11: qty 5

## 2019-06-11 MED ORDER — SODIUM CHLORIDE 0.9% FLUSH
10.0000 mL | INTRAVENOUS | Status: DC | PRN
  Administered 2019-06-11: 10 mL
  Filled 2019-06-11: qty 10

## 2019-06-11 NOTE — Progress Notes (Signed)
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                                                             +---------+---------------+---------+-----------+----------+-------------------+ PFV      Full                                                             +---------+---------------+---------+-----------+----------+-------------------+ POP      Full           Yes      Yes                                      +---------+---------------+---------+-----------+----------+-------------------+ PTV      Full                                                             +---------+---------------+---------+-----------+----------+-------------------+ PERO     Full                                                             +---------+---------------+---------+-----------+----------+-------------------+  GSV                     Yes      Yes                                      +---------+---------------+---------+-----------+----------+-------------------+      Summary: RIGHT: - No evidence of deep vein thrombosis in the lower extremity. No indirect evidence of obstruction proximal to the inguinal ligament. - prominent lymph nodes noted - Ultrasound characteristics of enlarged lymph nodes are noted in the groin.  LEFT: - There is no evidence of deep vein thrombosis in the lower extremity. However, portions of this examination were limited- see technologist comments above.  - multiple enlarged lymph nodes in left groin, largest measuring 5cm. - Ultrasound characteristics of enlarged lymph nodes noted in the groin.  *See table(s) above for measurements and observations. Electronically signed by Deitra Mayo MD on 05/23/2019 at 11:03:50 AM.    Final       IMPRESSION/PLAN: This is a very pleasant 64 year old gentleman with stage IV non-Hodgkin's lymphoma.  He is most symptomatic in his left leg, which demonstrates significant lymphedema related to the left inguinal bulky adenopathy.  He started chemotherapy this week.  I had a discussion with the patient, his wife, and later with Dr. Alen Blew.  Dr. Alen Blew and I concur that it would be reasonable for the patient to have at least a week of space from his first infusion of chemotherapy to determine if he has a rapid response to systemic therapy and subsequent improvement in his symptoms.  If he does not show a satisfactory response or improvement in his symptoms, we can simulate him for radiation at the end of next week.  I shared these impressions with the patient and his wife.  They concur with this plan.  I will ask our schedulers to arrange a CT simulation to take place on May 28 and we will start treatment soon thereafter if needed.    The patient and his wife understand that radiotherapy will be palliative with the goal of shrinking down the inguinal mass on the left and restoring lymphatic flow in his leg.  All questions were answered and the risks benefits and side effects of radiation therapy were discussed in  detail.  A consent form will be signed if and when he comes in for treatment planning.  I look forward to participating in his care, if needed.  This encounter was provided by telemedicine platform MyChart video.  The patient has given verbal consent for this type of encounter and has been advised to only accept a meeting of this type in a secure network environment. On date of service, in total, I spent 50 minutes on this encounter. The attendants for this meeting include Eppie Gibson  and Jairo Ben.  During the encounter, Eppie Gibson was located at [facility].  Aws Shere was located at home.    __________________________________________   Eppie Gibson, MD

## 2019-06-11 NOTE — Telephone Encounter (Signed)
Pt. Came in today for port flush and asked to speak with Dr. Alen Blew about his bilateral leg edema. Pt. Stated he also has a rash near his groin. Asked Pt if he wanted to see Sandi Mealy PA today because Dr. Alen Blew was gone for the day. Pt. Declined and stated he would like to speak with Dr. Alen Blew. In basket sent to Dr. Alen Blew

## 2019-06-12 ENCOUNTER — Telehealth (INDEPENDENT_AMBULATORY_CARE_PROVIDER_SITE_OTHER): Payer: No Typology Code available for payment source | Admitting: Internal Medicine

## 2019-06-12 ENCOUNTER — Encounter: Payer: Self-pay | Admitting: Internal Medicine

## 2019-06-12 VITALS — Wt 170.0 lb

## 2019-06-12 DIAGNOSIS — I480 Paroxysmal atrial fibrillation: Secondary | ICD-10-CM | POA: Diagnosis not present

## 2019-06-12 DIAGNOSIS — I340 Nonrheumatic mitral (valve) insufficiency: Secondary | ICD-10-CM | POA: Diagnosis not present

## 2019-06-12 DIAGNOSIS — G40909 Epilepsy, unspecified, not intractable, without status epilepticus: Secondary | ICD-10-CM

## 2019-06-12 DIAGNOSIS — I255 Ischemic cardiomyopathy: Secondary | ICD-10-CM

## 2019-06-12 DIAGNOSIS — C8335 Diffuse large B-cell lymphoma, lymph nodes of inguinal region and lower limb: Secondary | ICD-10-CM
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
## 2019-06-13 ENCOUNTER — Other Ambulatory Visit: Payer: Self-pay

## 2019-06-13 ENCOUNTER — Encounter (HOSPITAL_COMMUNITY): Payer: Self-pay

## 2019-06-13 ENCOUNTER — Encounter: Payer: Self-pay | Admitting: Cardiology

## 2019-06-13 ENCOUNTER — Inpatient Hospital Stay: Payer: No Typology Code available for payment source

## 2019-06-13 DIAGNOSIS — D61818 Other pancytopenia: Secondary | ICD-10-CM | POA: Diagnosis not present

## 2019-06-13 DIAGNOSIS — I5023 Acute on chronic systolic (congestive) heart failure: Secondary | ICD-10-CM | POA: Insufficient documentation

## 2019-06-13 DIAGNOSIS — D61810 Antineoplastic chemotherapy induced pancytopenia: Secondary | ICD-10-CM | POA: Diagnosis not present

## 2019-06-13 DIAGNOSIS — Z95828 Presence of other vascular implants and grafts: Secondary | ICD-10-CM

## 2019-06-13 MED ORDER — HEPARIN SOD (PORK) LOCK FLUSH 100 UNIT/ML IV SOLN
500.0000 [IU] | Freq: Once | INTRAVENOUS | Status: AC
  Administered 2019-06-13: 250 [IU] via INTRAVENOUS
  Filled 2019-06-13: qty 5

## 2019-06-13 MED ORDER — SODIUM CHLORIDE 0.9% FLUSH
10.0000 mL | INTRAVENOUS | Status: DC | PRN
  Administered 2019-06-13: 10 mL via INTRAVENOUS
  Filled 2019-06-13: qty 10

## 2019-06-13 NOTE — Assessment & Plan Note (Signed)
Despite having significantly reduced EF, he has unilateral swelling which is probably more related to edema.  Really hard to tell if his dyspnea is related to deconditioning, fatigue or from true HF.  Seems relatively euvolemic from a PND/orthopnea standpoint.   Not currently on standing diuretic.  He is using Lasix as needed. On carvedilol 3.125 mg twice daily with hold parameters. Actually has midodrine tablets for hypotension.

## 2019-06-13 NOTE — Assessment & Plan Note (Signed)
Initially had brief episode of A. fib post MI, recurred and now seems to be relatively persistent. Is on Xarelto.  Dr. Haroldine Laws started amiodarone.  Likely plan would be to allow for full loading with amiodarone and consider cardioversion.  Continue Xarelto, but will convert from Brilinta to clopidogrel to avoid excess bleeding risk.

## 2019-06-13 NOTE — Assessment & Plan Note (Signed)
I suspect elevated LFTs were probably related to his MI and lymphoma.  No signs of hepatic disease to begin with.  He is now on low-dose rosuvastatin.  Lipid levels look pretty good at the time of his MI.  We will need to reassess in 3 to 4 months.  I suspect that he now will have pretty significant malnutrition with aggressive weight loss in the setting of cancer.

## 2019-06-13 NOTE — Assessment & Plan Note (Signed)
He took a pretty significant hit with existing LAD and circumflex disease in the setting of occluded distal RCA.  Had extensive PCI PTCA but now has almost akinesis in the inferior wall. Follow-up echocardiogram was probably a little bit too early post MI to know for sure how much will recover.  Concerning feature is likely ischemic MR.  Was referred to Dr. Haroldine Laws from [facility] to assist with management given the complicated issue with his ongoing lymphoma care, A. fib and ischemic MR.

## 2019-06-13 NOTE — Assessment & Plan Note (Signed)
This is somewhat concerning especially with the A. fib and reduced EF.  May eventually need to consider mitral clip.  Plan for now is to monitor during his XRT-CHOP-R chemotherapy for lymphoma.  Low threshold to consider referral for mitral clip, but would like to see how he progresses following treatment which would allow [place] to then potentially titrate heart failure medications.  Being followed by Dr. Haroldine Laws.

## 2019-06-13 NOTE — Assessment & Plan Note (Addendum)
EF estimated 30 to 40% by echo with severe MR.  Unfortunate not able to titrate medications further.  We had to back down on carvedilol up to 3.125 mg twice daily and he is using midodrine for hypotension. Unable to tolerate low-dose losartan which had to be stopped because of hypotension.  Obviously not able to use spironolactone.  Seems euvolemic and therefore not on diuretic. Would like to better restore sinus rhythm-is on amiodarone. Also complicated by ischemic MR -> continue to follow along during treatment of lymphoma.  May require mitral clip.  Would defer acute management to Dr. Haroldine Laws in the [place].  Plan will be to coordinate treatment of A. fib, CHF (he is euvolemic) and ischemic MR.

## 2019-06-13 NOTE — Assessment & Plan Note (Signed)
Currently undergoing CHOP-R chemotherapy and plans for radiation.  Clearly there is concern with Adriamycin and reduced EF, however baseline EF thought to be more consistent with 40% per Dr. Haroldine Laws.  Will need close monitoring.

## 2019-06-13 NOTE — Assessment & Plan Note (Signed)
Now has multiple stents in the RCA as well as 1 in the LAD into the circumflex.  Despite having significant stents, I am concerned about increased risk of bleeding with Xarelto and Brilinta.  We will convert from Brilinta to Plavix and continue lifelong Plavix.  He was started on low-dose rosuvastatin by Dr. Haroldine Laws.  Lipids actually did not look all that bad at the time of his MI.  We will need to reassess in the next 3 to 4 months.  I suspect that with all of his weight loss, lipid levels will be relatively low.

## 2019-06-15 ENCOUNTER — Telehealth: Payer: Self-pay | Admitting: Emergency Medicine

## 2019-06-15 ENCOUNTER — Inpatient Hospital Stay: Payer: No Typology Code available for payment source

## 2019-06-15 ENCOUNTER — Emergency Department (HOSPITAL_COMMUNITY): Payer: No Typology Code available for payment source

## 2019-06-15 ENCOUNTER — Inpatient Hospital Stay (HOSPITAL_COMMUNITY)
Admission: EM | Admit: 2019-06-15 | Discharge: 2019-06-30 | DRG: 808 | Disposition: A | Discharge status: Home / Self Care | Payer: No Typology Code available for payment source | Attending: Internal Medicine | Admitting: Internal Medicine

## 2019-06-15 ENCOUNTER — Other Ambulatory Visit: Payer: Self-pay

## 2019-06-15 ENCOUNTER — Inpatient Hospital Stay (HOSPITAL_COMMUNITY): Payer: No Typology Code available for payment source

## 2019-06-15 ENCOUNTER — Ambulatory Visit
Admission: RE | Admit: 2019-06-15 | Discharge: 2019-06-15 | Disposition: A | Discharge status: Home / Self Care | Payer: No Typology Code available for payment source | Source: Ambulatory Visit | Attending: Radiation Oncology | Admitting: Radiation Oncology

## 2019-06-15 ENCOUNTER — Inpatient Hospital Stay (HOSPITAL_BASED_OUTPATIENT_CLINIC_OR_DEPARTMENT_OTHER): Payer: No Typology Code available for payment source | Admitting: Medical

## 2019-06-15 DIAGNOSIS — R64 Cachexia: Secondary | ICD-10-CM | POA: Diagnosis present

## 2019-06-15 DIAGNOSIS — I472 Ventricular tachycardia: Secondary | ICD-10-CM | POA: Diagnosis not present

## 2019-06-15 DIAGNOSIS — Z8249 Family history of ischemic heart disease and other diseases of the circulatory system: Secondary | ICD-10-CM | POA: Diagnosis not present

## 2019-06-15 DIAGNOSIS — Z5189 Encounter for other specified aftercare: Secondary | ICD-10-CM | POA: Diagnosis not present

## 2019-06-15 DIAGNOSIS — K567 Ileus, unspecified: Secondary | ICD-10-CM | POA: Diagnosis present

## 2019-06-15 DIAGNOSIS — I340 Nonrheumatic mitral (valve) insufficiency: Secondary | ICD-10-CM | POA: Diagnosis not present

## 2019-06-15 DIAGNOSIS — Z7901 Long term (current) use of anticoagulants: Secondary | ICD-10-CM

## 2019-06-15 DIAGNOSIS — D61818 Other pancytopenia: Secondary | ICD-10-CM | POA: Diagnosis present

## 2019-06-15 DIAGNOSIS — D630 Anemia in neoplastic disease: Secondary | ICD-10-CM | POA: Diagnosis present

## 2019-06-15 DIAGNOSIS — R140 Abdominal distension (gaseous): Secondary | ICD-10-CM

## 2019-06-15 DIAGNOSIS — I2510 Atherosclerotic heart disease of native coronary artery without angina pectoris: Secondary | ICD-10-CM | POA: Diagnosis present

## 2019-06-15 DIAGNOSIS — Z87891 Personal history of nicotine dependence: Secondary | ICD-10-CM

## 2019-06-15 DIAGNOSIS — K521 Toxic gastroenteritis and colitis: Secondary | ICD-10-CM | POA: Diagnosis present

## 2019-06-15 DIAGNOSIS — I252 Old myocardial infarction: Secondary | ICD-10-CM | POA: Diagnosis not present

## 2019-06-15 DIAGNOSIS — Z993 Dependence on wheelchair: Secondary | ICD-10-CM

## 2019-06-15 DIAGNOSIS — D61810 Antineoplastic chemotherapy induced pancytopenia: Principal | ICD-10-CM | POA: Diagnosis present

## 2019-06-15 DIAGNOSIS — E43 Unspecified severe protein-calorie malnutrition: Secondary | ICD-10-CM | POA: Diagnosis present

## 2019-06-15 DIAGNOSIS — D649 Anemia, unspecified: Secondary | ICD-10-CM

## 2019-06-15 DIAGNOSIS — I255 Ischemic cardiomyopathy: Secondary | ICD-10-CM | POA: Diagnosis present

## 2019-06-15 DIAGNOSIS — X58XXXA Exposure to other specified factors, initial encounter: Secondary | ICD-10-CM | POA: Diagnosis present

## 2019-06-15 DIAGNOSIS — I5043 Acute on chronic combined systolic (congestive) and diastolic (congestive) heart failure: Secondary | ICD-10-CM | POA: Diagnosis present

## 2019-06-15 DIAGNOSIS — Y929 Unspecified place or not applicable: Secondary | ICD-10-CM | POA: Diagnosis not present

## 2019-06-15 DIAGNOSIS — E785 Hyperlipidemia, unspecified: Secondary | ICD-10-CM | POA: Diagnosis present

## 2019-06-15 DIAGNOSIS — T451X5A Adverse effect of antineoplastic and immunosuppressive drugs, initial encounter: Secondary | ICD-10-CM | POA: Diagnosis present

## 2019-06-15 DIAGNOSIS — G40109 Localization-related (focal) (partial) symptomatic epilepsy and epileptic syndromes with simple partial seizures, not intractable, without status epilepticus: Secondary | ICD-10-CM

## 2019-06-15 DIAGNOSIS — I959 Hypotension, unspecified: Secondary | ICD-10-CM | POA: Diagnosis not present

## 2019-06-15 DIAGNOSIS — G40909 Epilepsy, unspecified, not intractable, without status epilepticus: Secondary | ICD-10-CM | POA: Diagnosis present

## 2019-06-15 DIAGNOSIS — Z955 Presence of coronary angioplasty implant and graft: Secondary | ICD-10-CM

## 2019-06-15 DIAGNOSIS — I5042 Chronic combined systolic (congestive) and diastolic (congestive) heart failure: Secondary | ICD-10-CM

## 2019-06-15 DIAGNOSIS — S80212A Abrasion, left knee, initial encounter: Secondary | ICD-10-CM | POA: Diagnosis present

## 2019-06-15 DIAGNOSIS — E860 Dehydration: Secondary | ICD-10-CM

## 2019-06-15 DIAGNOSIS — I080 Rheumatic disorders of both mitral and aortic valves: Secondary | ICD-10-CM | POA: Diagnosis present

## 2019-06-15 DIAGNOSIS — Z6824 Body mass index (BMI) 24.0-24.9, adult: Secondary | ICD-10-CM

## 2019-06-15 DIAGNOSIS — Z20822 Contact with and (suspected) exposure to covid-19: Secondary | ICD-10-CM | POA: Diagnosis present

## 2019-06-15 DIAGNOSIS — Z79899 Other long term (current) drug therapy: Secondary | ICD-10-CM | POA: Diagnosis not present

## 2019-06-15 DIAGNOSIS — J90 Pleural effusion, not elsewhere classified: Secondary | ICD-10-CM | POA: Diagnosis not present

## 2019-06-15 DIAGNOSIS — R5381 Other malaise: Secondary | ICD-10-CM | POA: Diagnosis not present

## 2019-06-15 DIAGNOSIS — C8330 Diffuse large B-cell lymphoma, unspecified site: Secondary | ICD-10-CM | POA: Diagnosis present

## 2019-06-15 DIAGNOSIS — C8335 Diffuse large B-cell lymphoma, lymph nodes of inguinal region and lower limb: Secondary | ICD-10-CM | POA: Diagnosis present

## 2019-06-15 DIAGNOSIS — E876 Hypokalemia: Secondary | ICD-10-CM | POA: Diagnosis present

## 2019-06-15 DIAGNOSIS — Z7982 Long term (current) use of aspirin: Secondary | ICD-10-CM

## 2019-06-15 DIAGNOSIS — I480 Paroxysmal atrial fibrillation: Secondary | ICD-10-CM | POA: Diagnosis present

## 2019-06-15 DIAGNOSIS — I441 Atrioventricular block, second degree: Secondary | ICD-10-CM | POA: Diagnosis present

## 2019-06-15 DIAGNOSIS — I950 Idiopathic hypotension: Secondary | ICD-10-CM

## 2019-06-15 DIAGNOSIS — Z5111 Encounter for antineoplastic chemotherapy: Secondary | ICD-10-CM | POA: Diagnosis present

## 2019-06-15 DIAGNOSIS — B370 Candidal stomatitis: Secondary | ICD-10-CM | POA: Diagnosis present

## 2019-06-15 DIAGNOSIS — L8990 Pressure ulcer of unspecified site, unspecified stage: Secondary | ICD-10-CM | POA: Insufficient documentation

## 2019-06-15 DIAGNOSIS — D701 Agranulocytosis secondary to cancer chemotherapy: Secondary | ICD-10-CM

## 2019-06-15 DIAGNOSIS — C8299 Follicular lymphoma, unspecified, extranodal and solid organ sites: Secondary | ICD-10-CM

## 2019-06-15 DIAGNOSIS — R42 Dizziness and giddiness: Secondary | ICD-10-CM | POA: Diagnosis not present

## 2019-06-15 DIAGNOSIS — R0600 Dyspnea, unspecified: Secondary | ICD-10-CM

## 2019-06-15 DIAGNOSIS — I509 Heart failure, unspecified: Secondary | ICD-10-CM | POA: Diagnosis not present

## 2019-06-15 DIAGNOSIS — I4891 Unspecified atrial fibrillation: Secondary | ICD-10-CM | POA: Diagnosis not present

## 2019-06-15 DIAGNOSIS — I361 Nonrheumatic tricuspid (valve) insufficiency: Secondary | ICD-10-CM | POA: Diagnosis not present

## 2019-06-15 DIAGNOSIS — I5023 Acute on chronic systolic (congestive) heart failure: Secondary | ICD-10-CM

## 2019-06-15 DIAGNOSIS — I4892 Unspecified atrial flutter: Secondary | ICD-10-CM | POA: Diagnosis present

## 2019-06-15 DIAGNOSIS — M79605 Pain in left leg: Secondary | ICD-10-CM | POA: Diagnosis not present

## 2019-06-15 DIAGNOSIS — R590 Localized enlarged lymph nodes: Secondary | ICD-10-CM | POA: Diagnosis not present

## 2019-06-15 DIAGNOSIS — E883 Tumor lysis syndrome: Secondary | ICD-10-CM | POA: Diagnosis not present

## 2019-06-15 DIAGNOSIS — K8020 Calculus of gallbladder without cholecystitis without obstruction: Secondary | ICD-10-CM | POA: Diagnosis not present

## 2019-06-15 DIAGNOSIS — L89151 Pressure ulcer of sacral region, stage 1: Secondary | ICD-10-CM | POA: Diagnosis present

## 2019-06-15 DIAGNOSIS — J449 Chronic obstructive pulmonary disease, unspecified: Secondary | ICD-10-CM | POA: Diagnosis present

## 2019-06-15 DIAGNOSIS — D72829 Elevated white blood cell count, unspecified: Secondary | ICD-10-CM | POA: Diagnosis present

## 2019-06-15 DIAGNOSIS — Z7902 Long term (current) use of antithrombotics/antiplatelets: Secondary | ICD-10-CM

## 2019-06-15 LAB — CBC WITH DIFFERENTIAL (CANCER CENTER ONLY)
Abs Immature Granulocytes: 0 10*3/uL (ref 0.00–0.07)
Basophils Absolute: 0 10*3/uL (ref 0.0–0.1)
Basophils Relative: 0 %
Eosinophils Absolute: 0 10*3/uL (ref 0.0–0.5)
Eosinophils Relative: 17 %
HCT: 22 % — ABNORMAL LOW (ref 39.0–52.0)
Hemoglobin: 6.9 g/dL — CL (ref 13.0–17.0)
Immature Granulocytes: 0 %
Lymphocytes Relative: 58 %
Lymphs Abs: 0.1 10*3/uL — ABNORMAL LOW (ref 0.7–4.0)
MCH: 25.4 pg — ABNORMAL LOW (ref 26.0–34.0)
MCHC: 31.4 g/dL (ref 30.0–36.0)
MCV: 80.9 fL (ref 80.0–100.0)
Monocytes Absolute: 0 10*3/uL — ABNORMAL LOW (ref 0.1–1.0)
Monocytes Relative: 8 %
Neutro Abs: 0 10*3/uL — CL (ref 1.7–7.7)
Neutrophils Relative %: 17 %
Platelet Count: 44 10*3/uL — ABNORMAL LOW (ref 150–400)
RBC: 2.72 MIL/uL — ABNORMAL LOW (ref 4.22–5.81)
RDW: 17.5 % — ABNORMAL HIGH (ref 11.5–15.5)
WBC Count: 0.1 10*3/uL — CL (ref 4.0–10.5)
nRBC: 0 % (ref 0.0–0.2)

## 2019-06-15 LAB — CMP (CANCER CENTER ONLY)
ALT: 23 U/L (ref 0–44)
AST: 18 U/L (ref 15–41)
Albumin: 2.3 g/dL — ABNORMAL LOW (ref 3.5–5.0)
Alkaline Phosphatase: 67 U/L (ref 38–126)
Anion gap: 10 (ref 5–15)
BUN: 13 mg/dL (ref 8–23)
CO2: 24 mmol/L (ref 22–32)
Calcium: 7.9 mg/dL — ABNORMAL LOW (ref 8.9–10.3)
Chloride: 103 mmol/L (ref 98–111)
Creatinine: 0.8 mg/dL (ref 0.61–1.24)
GFR, Est AFR Am: >60 mL/min (ref >60)
GFR, Estimated: >60 mL/min (ref >60)
Glucose, Bld: 91 mg/dL (ref 70–99)
Potassium: 3.1 mmol/L — ABNORMAL LOW (ref 3.5–5.1)
Sodium: 137 mmol/L (ref 135–145)
Total Bilirubin: 0.8 mg/dL (ref 0.3–1.2)
Total Protein: 4.5 g/dL — ABNORMAL LOW (ref 6.5–8.1)

## 2019-06-15 LAB — SARS CORONAVIRUS 2 BY RT PCR (HOSPITAL ORDER, PERFORMED IN ~~LOC~~ HOSPITAL LAB): SARS Coronavirus 2: NEGATIVE

## 2019-06-15 LAB — PREPARE RBC (CROSSMATCH)

## 2019-06-15 LAB — PROTIME-INR
INR: 1.9 — ABNORMAL HIGH (ref 0.8–1.2)
Prothrombin Time: 21.1 seconds — ABNORMAL HIGH (ref 11.4–15.2)

## 2019-06-15 LAB — APTT: aPTT: 48 seconds — ABNORMAL HIGH (ref 24–36)

## 2019-06-15 MED ORDER — SODIUM CHLORIDE 0.9% FLUSH
3.0000 mL | Freq: Two times a day (BID) | INTRAVENOUS | Status: DC
  Administered 2019-06-16 – 2019-06-30 (×18): 3 mL via INTRAVENOUS

## 2019-06-15 MED ORDER — SODIUM CHLORIDE 0.9 % IV SOLN: 250.0000 mL | INTRAVENOUS | Status: DC | PRN

## 2019-06-15 MED ORDER — SODIUM CHLORIDE 0.9 % IV SOLN
10.0000 mL/h | Freq: Once | INTRAVENOUS | Status: AC
  Administered 2019-06-15: 10 mL/h via INTRAVENOUS

## 2019-06-15 MED ORDER — NITROGLYCERIN 0.4 MG SL SUBL: 0.4000 mg | SUBLINGUAL_TABLET | SUBLINGUAL | Status: DC | PRN

## 2019-06-15 MED ORDER — FUROSEMIDE 40 MG PO TABS: 40.0000 mg | ORAL_TABLET | Freq: Every day | ORAL | Status: DC | PRN

## 2019-06-15 MED ORDER — MIDODRINE HCL 5 MG PO TABS
2.5000 mg | ORAL_TABLET | Freq: Three times a day (TID) | ORAL | Status: DC
  Administered 2019-06-16 – 2019-06-30 (×43): 2.5 mg via ORAL
  Filled 2019-06-15 (×44): qty 1

## 2019-06-15 MED ORDER — TICAGRELOR 90 MG PO TABS
90.0000 mg | ORAL_TABLET | Freq: Two times a day (BID) | ORAL | Status: DC
  Administered 2019-06-15 – 2019-06-30 (×30): 90 mg via ORAL
  Filled 2019-06-15 (×31): qty 1

## 2019-06-15 MED ORDER — ALLOPURINOL 100 MG PO TABS
300.0000 mg | ORAL_TABLET | Freq: Every day | ORAL | Status: DC
  Administered 2019-06-15 – 2019-06-30 (×16): 300 mg via ORAL
  Filled 2019-06-15 (×5): qty 3
  Filled 2019-06-15: qty 1
  Filled 2019-06-15: qty 3
  Filled 2019-06-15: qty 1
  Filled 2019-06-15 (×2): qty 3
  Filled 2019-06-15: qty 1
  Filled 2019-06-15 (×2): qty 3
  Filled 2019-06-15: qty 1
  Filled 2019-06-15 (×2): qty 3

## 2019-06-15 MED ORDER — RIVAROXABAN 20 MG PO TABS
20.0000 mg | ORAL_TABLET | Freq: Every day | ORAL | Status: DC
  Administered 2019-06-15 – 2019-06-30 (×16): 20 mg via ORAL
  Filled 2019-06-15 (×16): qty 1

## 2019-06-15 MED ORDER — CARVEDILOL 3.125 MG PO TABS
3.1250 mg | ORAL_TABLET | Freq: Two times a day (BID) | ORAL | Status: DC
  Administered 2019-06-16 – 2019-06-18 (×3): 3.125 mg via ORAL
  Filled 2019-06-15 (×7): qty 1

## 2019-06-15 MED ORDER — ROSUVASTATIN CALCIUM 10 MG PO TABS
5.0000 mg | ORAL_TABLET | Freq: Every day | ORAL | Status: DC
  Administered 2019-06-15 – 2019-06-29 (×15): 5 mg via ORAL
  Filled 2019-06-15 (×15): qty 1

## 2019-06-15 MED ORDER — FUROSEMIDE 10 MG/ML IJ SOLN
20.0000 mg | Freq: Once | INTRAMUSCULAR | Status: AC
  Administered 2019-06-16: 20 mg via INTRAVENOUS
  Filled 2019-06-15: qty 2

## 2019-06-15 MED ORDER — ONDANSETRON HCL 4 MG PO TABS: 4.0000 mg | ORAL_TABLET | Freq: Four times a day (QID) | ORAL | Status: DC | PRN

## 2019-06-15 MED ORDER — SERTRALINE HCL 50 MG PO TABS
25.0000 mg | ORAL_TABLET | Freq: Every day | ORAL | Status: DC
  Administered 2019-06-16 – 2019-06-23 (×8): 25 mg via ORAL
  Filled 2019-06-15 (×8): qty 1

## 2019-06-15 MED ORDER — PANTOPRAZOLE SODIUM 40 MG PO TBEC: 40.0000 mg | DELAYED_RELEASE_TABLET | Freq: Every day | ORAL | Status: DC | PRN

## 2019-06-15 MED ORDER — AMIODARONE HCL 200 MG PO TABS
200.0000 mg | ORAL_TABLET | Freq: Two times a day (BID) | ORAL | Status: DC
  Administered 2019-06-16 – 2019-06-18 (×6): 200 mg via ORAL
  Filled 2019-06-15 (×6): qty 1

## 2019-06-15 MED ORDER — LEVETIRACETAM 500 MG PO TABS
750.0000 mg | ORAL_TABLET | Freq: Two times a day (BID) | ORAL | Status: DC
  Administered 2019-06-15 – 2019-06-30 (×30): 750 mg via ORAL
  Filled 2019-06-15 (×31): qty 1

## 2019-06-15 MED ORDER — ACETAMINOPHEN 325 MG PO TABS
650.0000 mg | ORAL_TABLET | Freq: Four times a day (QID) | ORAL | Status: DC | PRN
  Administered 2019-06-17 (×2): 650 mg via ORAL
  Filled 2019-06-15 (×2): qty 2

## 2019-06-15 MED ORDER — LAMOTRIGINE 100 MG PO TABS
100.0000 mg | ORAL_TABLET | Freq: Two times a day (BID) | ORAL | Status: DC
  Administered 2019-06-15 – 2019-06-30 (×30): 100 mg via ORAL
  Filled 2019-06-15 (×30): qty 1

## 2019-06-15 MED ORDER — ONDANSETRON HCL 4 MG/2ML IJ SOLN: 4.0000 mg | Freq: Four times a day (QID) | INTRAMUSCULAR | Status: DC | PRN

## 2019-06-15 MED ORDER — ACETAMINOPHEN 650 MG RE SUPP: 650.0000 mg | Freq: Four times a day (QID) | RECTAL | Status: DC | PRN

## 2019-06-15 MED ORDER — SODIUM CHLORIDE 0.9% FLUSH
3.0000 mL | Freq: Two times a day (BID) | INTRAVENOUS | Status: DC
  Administered 2019-06-18 – 2019-06-30 (×15): 3 mL via INTRAVENOUS

## 2019-06-15 MED ORDER — SODIUM CHLORIDE 0.9% FLUSH: 3.0000 mL | INTRAVENOUS | Status: DC | PRN
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
## 2019-06-15 NOTE — ED Notes (Signed)
Per oncology-states patient's WBC .0-low hgb-needs admission

## 2019-06-15 NOTE — Telephone Encounter (Signed)
Critical lab received 1413: WBC 0.1, Hgb 6.9, and ANC 0.0 PA Lucianne Lei made aware.

## 2019-06-15 NOTE — Progress Notes (Signed)
Per [place] Client: hospitalist will not direct admit patient d/t hypotension and neutropenia.   Called ED and spoke with charge nurse Erline Levine who said they would have a bed available for patient in about 10 min. Will escort patient and wife to ED and provide bedside report.

## 2019-06-15 NOTE — ED Triage Notes (Signed)
Pt from [facility]-  Per CC RN- Pt here for radiation simulation, scheduled to start next week, lab work = WBC 0.1, hgB 6.9, absolute neutrophil = 0.0.   Hx of afib, chf, b-cell lymphoma in inguinal region.   AOx4, non ambulatory d/t inguinal swelling wife at bedside.    1st cycle of rchop started 5/19. Growth stimulator injection 5/21.    Double lumen PICC in right arm. Flushed this AM.   CT showed RLQ bowel distension. Denies constipation.

## 2019-06-15 NOTE — H&P (Addendum)
History and Physical  Tony Saunders [id number] DOB: 1955/07/23 DOA: 06/15/2019  PCP: [name], [name], MD   Chief Complaint: Weakness  HPI:  64 year old man, diagnosed with diffuse B-cell lymphoma May 2021, started chemotherapy 5/19, presented for an outpatient port flush and radiation radiation simulation, found to be hypotensive and pancytopenic and referred for admission.  Patient reports at least one episode of diarrhea per day since chemotherapy started.  No nausea or vomiting.  Appetite has been quite poor for some time now, food does not taste good, he has been losing weight for some time now.  Bilateral lower extremity edema left greater than right has been attributed primarily to compression from lymphoma rather than CHF.  Generalized weakness since heart attack in March, essentially wheelchair-bound and unable to walk.  Has continued to try to drink fluids although this is following off in the last few days.  Presented to clinic today for routine port flush and radiation simulation.  Does report her blood pressure has been low as of late which is confirmed by review of the record.  Chart review: . Blood pressure 92/54 May 20, 87/67 5/12  ED Course: Plans made for type and cross PRBC  Review of Systems:  No fever, new visual changes, rash, new muscle aches, chest pain, shortness of breath, dysuria, nausea or vomiting.  No chest pain or shortness of breath.  Positive for sore throat.  Some bleeding from knee after a fall.  PMH . Three-vessel CAD . Systolic and diastolic CHF . Diffuse large B-cell lymphoma . PAF . Remainder reviewed in Epic  [place] . Heart catheterization stent placement . Remainder reviewed in Epic  Family history includes: . Father with heart disease . Remainder reviewed in [place] History . Former smoker, drinks several drinks per day  Allergies . None  Meds include: Current Meds  Medication Sig  . allopurinol  (ZYLOPRIM) 300 MG tablet Take 1 tablet (300 mg total) by mouth daily.  Marland Kitchen amiodarone (PACERONE) 200 MG tablet Take 1 tablet (200 mg total) by mouth 2 (two) times daily.  . carvedilol (COREG) 6.25 MG tablet Take 0.5 tablets (3.125 mg total) by mouth 2 (two) times daily with a meal.  . furosemide (LASIX) 40 MG tablet Take 1 tablet (40 mg total) by mouth daily as needed for fluid.  Marland Kitchen lamoTRIgine (LAMICTAL) 100 MG tablet Take 1 tablet (100 mg total) by mouth 2 (two) times daily.  Marland Kitchen levETIRAcetam (KEPPRA) 750 MG tablet Take 1 tablet (750 mg total) by mouth 2 (two) times daily.  Marland Kitchen lidocaine-prilocaine (EMLA) cream Apply 1 application topically as needed.  . midodrine (PROAMATINE) 2.5 MG tablet Take 1 tablet (2.5 mg total) by mouth 3 (three) times daily with meals.  . nitroGLYCERIN (NITROSTAT) 0.4 MG SL tablet Place 1 tablet (0.4 mg total) under the tongue every 5 (five) minutes as needed for chest pain.  Marland Kitchen ondansetron (ZOFRAN) 4 MG tablet Take 1 tablet (4 mg total) by mouth every 6 (six) hours as needed for nausea.  . pantoprazole (PROTONIX) 40 MG tablet Take 1 tablet (40 mg total) by mouth daily. (Patient taking differently: Take 40 mg by mouth daily as needed (for severe nausea). )  . potassium chloride SA (KLOR-CON) 20 MEQ tablet Take 1 tablet (20 mEq total) by mouth daily as needed (when you take Furosemide).  . prochlorperazine (COMPAZINE) 10 MG tablet Take 1 tablet (10 mg total) by mouth every 6 (six) hours as needed for nausea or vomiting.  Marland Kitchen  rivaroxaban (XARELTO) 20 MG TABS tablet Take 1 tablet (20 mg total) by mouth daily with supper. (Patient taking differently: Take 20 mg by mouth daily at 8 pm. )  . rosuvastatin (CRESTOR) 5 MG tablet Take 1 tablet (5 mg total) by mouth daily. (Patient taking differently: Take 5 mg by mouth at bedtime. )  . sertraline (ZOLOFT) 25 MG tablet Take 1 tablet (25 mg total) by mouth daily.  . ticagrelor (BRILINTA) 90 MG TABS tablet Take 90 mg by mouth 2 (two) times  daily. Finish prescription and then switch to Plavix    Physicial Exam   Vitals:  . 8.1, 20, 82, 95/58, 100% on room air  Constitutional:   . Appears calm and comfortable, cachectic, weak Eyes:  . pupils and irises appear normal . Normal lids ENMT:  . grossly normal hearing  . Lips appear normal . Few red spots on soft palate, no definite petechiae seen. Neck:  . neck appears normal Respiratory:  . CTA bilaterally, no w/r/r.  . Respiratory effort appears normal.  Cardiovascular:  . RRR, no m/r/g . 2+ left greater than right LE extremity edema   Abdomen:  . Soft, nontender, mildly distended . No hernias noted Musculoskeletal:  . Digits/nails BUE: no clubbing, cyanosis, petechiae, infection .  RUE, LUE, RLE, LLE   o strength generally weak and tone normal, no atrophy, no abnormal movements o No tenderness Skin:  . No rashes, lesions, ulcers seen . palpation of skin: no induration or nodules Psychiatric:  . Mental status o Mood, affect appropriate . judgment and insight appear intact   I have personally reviewed following labs and imaging studies  Labs:  Marland Kitchen Potassium 3.1, remainder CMP unremarkable . WBC 8.1, hemoglobin 6.9, platelets 44  Imaging studies:   Mild to moderate right-sided pleural effusion  Medical tests:     Significant Hospital Events   . 5/28 admitted for pancytopenia   Consults:  . Oncology   Procedures:  .   Significant Diagnostic Tests:  Marland Kitchen    Micro Data:  .    Antimicrobials:  .   ASSESSMENT/PLAN  Pancytopenia secondary to chemotherapy, symptomatic anemia. --Transfuse PRBCs, administer Lasix in between units. --No petechiae seen, follow-up platelets daily.  No indication for platelet transfusion at this point.  No bleeding noted. --Oncology consultation placed Dr. Burr Medico for recommendations in regard to pancytopenia especially leukopenia  Diffuse B-cell lymphoma --Per oncology  Borderline hypotension over the last several  weeks --Systolic blood pressure in the 90s.  Not a new finding.  No evidence of sepsis or even infection.  Expect will improve somewhat with administration of blood. --Hold Coreg for SBP less than 100  Diarrhea --Not voluminous, probably related to chemotherapy.  No signs or symptoms to suggest c diff.  Ischemic cardiomyopathy, combined systolic, diastolic CHF, LVEF [id number], severe mitral regurgitation, PAF. --Lungs sound clear although he does have a small pleural effusion on chest x-ray.  No respiratory symptoms.  Bilateral lower extremity edema probably more related to compression phenomenon from lymphoma than CHF. --Continue carvedilol, Brilinta, Xarelto, benefit outweighs risk.  No evidence of bleeding. Appropriate home as above  Seizure disorder --Asymptomatic.  Continue Lamictal and Keppra.  DVT prophylaxis: Xarelto Code Status: Full per patient Family Communication: wife at bedside Consults called: oncology    Time spent: 55 minutes  Murray Hodgkins, MD  [facility] Direct contact: see [email or web address]  7PM-7AM contact night coverage as below   1. Check the care team in [facility] and look for a)  attending/consulting [facility] provider listed and b) the [facility] team listed 2. Log into [email or web address] and use [facility]'s universal password to access. If you do not have the password, please contact the hospital operator. 3. Locate the [facility] provider you are looking for under [facility] and page to a number that you can be directly reached. 4. If you still have difficulty reaching the provider, please page the [facility] (Director on Call) for the Hospitalists listed on amion for assistance.  Severity of Illness: The appropriate patient status for this patient is INPATIENT. Inpatient status is judged to be reasonable and necessary in order to provide the required intensity of service to ensure the patient's safety. The patient's presenting symptoms, physical exam findings, and initial  radiographic and laboratory data in the context of their chronic comorbidities is felt to place them at high risk for further clinical deterioration. Furthermore, it is not anticipated that the patient will be medically stable for discharge from the hospital within 2 midnights of admission. The following factors support the patient status of inpatient.   " The patient's presenting symptoms include generalized weakness. " The worrisome physical exam findings include bilateral lower extremity edema, borderline hypotension. " The initial radiographic and laboratory data are worrisome because of severe pancytopenia. " The chronic co-morbidities include diffuse B-cell lymphoma on chemotherapy, combined systolic and diastolic CHF, ischemic cardiomyopathy.   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

## 2019-06-15 NOTE — Progress Notes (Signed)
Has armband been applied?  Yes.    Does patient have an allergy to IV contrast dye?: No.   Has patient ever received premedication for IV contrast dye?: No.   Does patient take metformin?: No.  Date of lab work: Jun 15, 2019 BUN: 13 CR: 0.80  IV site: upper arm right PICC, condition patent and no redness  Has IV site been added to flowsheet?  Yes.    BP (!) 85/67   Pulse 95   Temp 97.9 F (36.6 C)   Resp 17   Ht 6' (1.829 m)   SpO2 98%   BMI 23.06 kg/m

## 2019-06-15 NOTE — Progress Notes (Signed)
Pt arrived today for PICC line flush. Patients wife was concerned he is not eating enough and is getting week. Drew labs and made app for symptoms management clinic.

## 2019-06-15 NOTE — Progress Notes (Signed)
The patient presented for a port flush and labs today.  His CBC returned with a WBC of 0.1, hemoglobin 6.9, and ANC of 0.  He was seen next in radiation oncology for his simulation appointment.  His blood pressure there was 85/67.  The staff of radiation oncology was directed to take the patient to the emergency room for evaluation and management.  They expressed understanding and agreement with this and are planning to take the patient to the emergency room as soon as his simulation study is completed.  Sandi Mealy, MHS, PA-C Physician Assistant

## 2019-06-15 NOTE — Progress Notes (Signed)
Morell Brace   DOB:19-Jul-1955   [id number]   [id number]  Oncology follow up note   Subjective: Patient is known to our service, under my partner Dr. Hazeline Junker care for his recently diagnosed diffuse large B-cell lymphoma.  He started chemotherapy R-CHOP on Jun 06, 2019, and [place] on Jun 08, 2019.  He came in to our cancer center for lab, and radiation oncology simulation.  He was found to be pancytopenic, and hypotensive.  Patient was sent to ED for further evaluation.  He denies fever or chills, has been very fatigued lately.  He has loose bowel movements once daily, denies hematochezia.  No other bleeding.   Objective:  Vitals:   06/15/19 1750 06/15/19 1816  BP: (!) 95/58 90/60  Pulse:  91  Resp: 20 16  Temp:    SpO2: 100% 93%    Body mass index is 23.06 kg/m. No intake or output data in the 24 hours ending 06/15/19 1823   Sclerae unicteric  Oropharynx clear  Lungs clear -- no rales or rhonchi  Heart regular rate and rhythm  Abdomen benign  MSK no focal spinal tenderness, (+) significant left lower extremity edema, with mild bleeding below left knee from recent fall.  Neuro nonfocal   CBG (last 3)  No results for input(s): GLUCAP in the last 72 hours.   Labs:  Lab Results  Component Value Date   WBC 0.1 (LL) 06/15/2019   HGB 6.9 (LL) 06/15/2019   HCT 22.0 (L) 06/15/2019   MCV 80.9 06/15/2019   PLT 44 (L) 06/15/2019   NEUTROABS 0.0 (LL) 06/15/2019    Urine Studies No results for input(s): UHGB, CRYS in the last 72 hours.  Invalid input(s): UACOL, UAPR, USPG, UPH, UTP, UGL, UKET, UBIL, UNIT, UROB, ULEU, UEPI, UWBC, URBC, UBAC, CAST, UCOM, BILUA  Basic Metabolic Panel: Recent Labs  Lab 06/15/19 1400  NA 137  K 3.1*  CL 103  CO2 24  GLUCOSE 91  BUN 13  CREATININE 0.80  CALCIUM 7.9*   GFR Estimated Creatinine Clearance: 104.4 mL/min (by C-G formula based on SCr of 0.8 mg/dL). Liver Function Tests: Recent Labs  Lab 06/15/19 1400  AST 18   ALT 23  ALKPHOS 67  BILITOT 0.8  PROT 4.5*  ALBUMIN 2.3*   No results for input(s): LIPASE, AMYLASE in the last 168 hours. No results for input(s): AMMONIA in the last 168 hours. Coagulation profile Recent Labs  Lab 06/15/19 1630  INR 1.9*    CBC: Recent Labs  Lab 06/15/19 1400  WBC 0.1*  NEUTROABS 0.0*  HGB 6.9*  HCT 22.0*  MCV 80.9  PLT 44*   Cardiac Enzymes: No results for input(s): CKTOTAL, CKMB, CKMBINDEX, TROPONINI in the last 168 hours. BNP: Invalid input(s): POCBNP CBG: No results for input(s): GLUCAP in the last 168 hours. D-Dimer No results for input(s): DDIMER in the last 72 hours. Hgb A1c No results for input(s): HGBA1C in the last 72 hours. Lipid Profile No results for input(s): CHOL, HDL, LDLCALC, TRIG, CHOLHDL, LDLDIRECT in the last 72 hours. Thyroid function studies No results for input(s): TSH, T4TOTAL, T3FREE, THYROIDAB in the last 72 hours.  Invalid input(s): FREET3 Anemia work up No results for input(s): VITAMINB12, FOLATE, FERRITIN, TIBC, IRON, RETICCTPCT in the last 72 hours. Microbiology Recent Results (from the past 240 hour(s))  SARS Coronavirus 2 by RT PCR (hospital order, performed in [facility] hospital lab) Nasopharyngeal Nasopharyngeal Swab     Status: None   Collection Time: 06/15/19  4:47 PM   Specimen: Nasopharyngeal Swab  Result Value Ref Range Status   SARS Coronavirus 2 NEGATIVE NEGATIVE Final    Comment: (NOTE) SARS-CoV-2 target nucleic acids are NOT DETECTED. The SARS-CoV-2 RNA is generally detectable in upper and lower respiratory specimens during the acute phase of infection. The lowest concentration of SARS-CoV-2 viral copies this assay can detect is 250 copies / mL. A negative result does not preclude SARS-CoV-2 infection and should not be used as the sole basis for treatment or other patient management decisions.  A negative result may occur with improper specimen collection / handling, submission of specimen  other than nasopharyngeal swab, presence of viral mutation(s) within the areas targeted by this assay, and inadequate number of viral copies (<250 copies / mL). A negative result must be combined with clinical observations, patient history, and epidemiological information. Fact Sheet for Patients:   StrictlyIdeas.no Fact Sheet for Healthcare Providers: BankingDealers.co.za This test is not yet approved or cleared  by the [place] FDA and has been authorized for detection and/or diagnosis of SARS-CoV-2 by FDA under an Emergency Use Authorization (EUA).  This EUA will remain in effect (meaning this test can be used) for the duration of the COVID-19 declaration under Section 564(b)(1) of the Act, 21 U.S.C. section 360bbb-3(b)(1), unless the authorization is terminated or revoked sooner. Performed at [facility] [address], [place], [place] [address]       Studies:  DG Chest Port 1 View  Result Date: 06/15/2019 CLINICAL DATA:  Shortness of breath. Weakness. Status post chemotherapy and radiation therapy for diffuse B cell lymphoma. History of CHF. EXAM: PORTABLE CHEST 1 VIEW COMPARISON:  05/22/2019 FINDINGS: Cardiac silhouette near the upper limit of normal in size with an interval mild decrease in size. Small to moderate-sized right pleural effusion, mildly increased. Clear left lung. No left pleural fluid. Unremarkable bones. IMPRESSION: Small to moderate-sized right pleural effusion, mildly increased. Electronically Signed   By: Claudie Revering M.D.   On: 06/15/2019 17:44    Assessment: 64 y.o. male with recently diagnosed diffuse large B-cell lymphoma, received first cycle chemotherapy R-CHOP on Jun 06, 2019  1.  Severe pancytopenia secondary to chemotherapy 2.  Left lower extremity edema from inguinal adenopathy 3.  Borderline hypotension, improved 4.  Coronary artery disease, CHF, severe mitral  regurgitation 5.  Seizure disorder    Plan:  -I agree with blood transfusion, he will receive 3 units.  Please repeat CBC in the morning -No need G-CSF, he has received Udenyca on May 21.  But he is at very high risk for infection due to the severe neutropenia, low threshold for ID work-up and antibiotics if there is any clinical concern for infection. -His hypotension was probably related to hydration, and blood pressure medication, proved now.  No clinical suspicion for sepsis -Consider platelet transfusion if platelet less than 15K  -if he is clinically stable, blood counts improved in the next few days, OK to discharge over the weekend -Please call us over the weekend if needed.   Truitt Merle, MD 06/15/2019  6:23 PM

## 2019-06-16 DIAGNOSIS — E876 Hypokalemia: Secondary | ICD-10-CM

## 2019-06-16 DIAGNOSIS — B370 Candidal stomatitis: Secondary | ICD-10-CM

## 2019-06-16 DIAGNOSIS — K567 Ileus, unspecified: Secondary | ICD-10-CM

## 2019-06-16 DIAGNOSIS — L8990 Pressure ulcer of unspecified site, unspecified stage: Secondary | ICD-10-CM | POA: Insufficient documentation

## 2019-06-16 LAB — BASIC METABOLIC PANEL
Anion gap: 9 (ref 5–15)
Anion gap: 8 (ref 5–15)
BUN: 14 mg/dL (ref 8–23)
BUN: 15 mg/dL (ref 8–23)
CO2: 25 mmol/L (ref 22–32)
CO2: 25 mmol/L (ref 22–32)
Calcium: 7.5 mg/dL — ABNORMAL LOW (ref 8.9–10.3)
Calcium: 7.4 mg/dL — ABNORMAL LOW (ref 8.9–10.3)
Chloride: 99 mmol/L (ref 98–111)
Chloride: 99 mmol/L (ref 98–111)
Creatinine, Ser: 0.99 mg/dL (ref 0.61–1.24)
Creatinine, Ser: 0.88 mg/dL (ref 0.61–1.24)
GFR calc Af Amer: >60 mL/min (ref >60)
GFR calc Af Amer: >60 mL/min (ref >60)
GFR calc non Af Amer: >60 mL/min (ref >60)
GFR calc non Af Amer: >60 mL/min (ref >60)
Glucose, Bld: 79 mg/dL (ref 70–99)
Glucose, Bld: 82 mg/dL (ref 70–99)
Potassium: 2.3 mmol/L — CL (ref 3.5–5.1)
Potassium: 2.8 mmol/L — ABNORMAL LOW (ref 3.5–5.1)
Sodium: 133 mmol/L — ABNORMAL LOW (ref 135–145)
Sodium: 132 mmol/L — ABNORMAL LOW (ref 135–145)

## 2019-06-16 LAB — CBC WITH DIFFERENTIAL/PLATELET
Abs Immature Granulocytes: 0 10*3/uL (ref 0.00–0.07)
Basophils Absolute: 0 10*3/uL (ref 0.0–0.1)
Basophils Relative: 0 %
Eosinophils Absolute: 0 10*3/uL (ref 0.0–0.5)
Eosinophils Relative: 6 %
HCT: 25.3 % — ABNORMAL LOW (ref 39.0–52.0)
Hemoglobin: 8.1 g/dL — ABNORMAL LOW (ref 13.0–17.0)
Immature Granulocytes: 0 %
Lymphocytes Relative: 44 %
Lymphs Abs: 0.1 10*3/uL — ABNORMAL LOW (ref 0.7–4.0)
MCH: 25.9 pg — ABNORMAL LOW (ref 26.0–34.0)
MCHC: 32 g/dL (ref 30.0–36.0)
MCV: 80.8 fL (ref 80.0–100.0)
Monocytes Absolute: 0 10*3/uL — ABNORMAL LOW (ref 0.1–1.0)
Monocytes Relative: 25 %
Neutro Abs: 0 10*3/uL — ABNORMAL LOW (ref 1.7–7.7)
Neutrophils Relative %: 25 %
Platelets: 36 10*3/uL — ABNORMAL LOW (ref 150–400)
RBC: 3.13 MIL/uL — ABNORMAL LOW (ref 4.22–5.81)
RDW: 16.6 % — ABNORMAL HIGH (ref 11.5–15.5)
WBC: 0.2 10*3/uL — CL (ref 4.0–10.5)
nRBC: 0 % (ref 0.0–0.2)

## 2019-06-16 LAB — ABO/RH: ABO/RH(D): O POS

## 2019-06-16 LAB — MAGNESIUM: Magnesium: 1.6 mg/dL — ABNORMAL LOW (ref 1.7–2.4)

## 2019-06-16 LAB — URINALYSIS, ROUTINE W REFLEX MICROSCOPIC
Bacteria, UA: NONE SEEN
Bilirubin Urine: NEGATIVE
Glucose, UA: NEGATIVE mg/dL
Hgb urine dipstick: NEGATIVE
Ketones, ur: 5 mg/dL — AB
Leukocytes,Ua: NEGATIVE
Nitrite: NEGATIVE
Protein, ur: 30 mg/dL — AB
Specific Gravity, Urine: 1.019 (ref 1.005–1.030)
pH: 6 (ref 5.0–8.0)

## 2019-06-16 MED ORDER — OCUVITE-LUTEIN PO CAPS: 1.0000 | ORAL_CAPSULE | Freq: Every day | ORAL | Status: DC

## 2019-06-16 MED ORDER — SODIUM CHLORIDE 0.9% FLUSH
10.0000 mL | INTRAVENOUS | Status: DC | PRN
  Administered 2019-06-26: 10 mL

## 2019-06-16 MED ORDER — POTASSIUM CHLORIDE 10 MEQ/100ML IV SOLN: 10.0000 meq | INTRAVENOUS | Status: DC

## 2019-06-16 MED ORDER — ENSURE ENLIVE PO LIQD
237.0000 mL | Freq: Two times a day (BID) | ORAL | Status: DC
  Administered 2019-06-17 – 2019-06-30 (×13): 237 mL via ORAL

## 2019-06-16 MED ORDER — POTASSIUM CHLORIDE 10 MEQ/100ML IV SOLN
10.0000 meq | INTRAVENOUS | Status: AC
  Administered 2019-06-16 – 2019-06-17 (×4): 10 meq via INTRAVENOUS
  Filled 2019-06-16 (×4): qty 100

## 2019-06-16 MED ORDER — MAGNESIUM SULFATE 2 GM/50ML IV SOLN
2.0000 g | Freq: Once | INTRAVENOUS | Status: AC
  Administered 2019-06-16: 2 g via INTRAVENOUS
  Filled 2019-06-16: qty 50

## 2019-06-16 MED ORDER — SODIUM CHLORIDE 0.9 % IV SOLN: INTRAVENOUS | Status: AC

## 2019-06-16 MED ORDER — CHLORHEXIDINE GLUCONATE CLOTH 2 % EX PADS
6.0000 | MEDICATED_PAD | Freq: Every day | CUTANEOUS | Status: DC
  Administered 2019-06-16 – 2019-06-21 (×6): 6 via TOPICAL

## 2019-06-16 MED ORDER — UNJURY CHICKEN SOUP POWDER
1.0000 | Freq: Two times a day (BID) | ORAL | Status: DC
  Administered 2019-06-16 – 2019-06-17 (×2): 1 via ORAL
  Filled 2019-06-16 (×10): qty 27

## 2019-06-16 MED ORDER — FLUCONAZOLE IN SODIUM CHLORIDE 200-0.9 MG/100ML-% IV SOLN
200.0000 mg | Freq: Once | INTRAVENOUS | Status: AC
  Administered 2019-06-16: 200 mg via INTRAVENOUS
  Filled 2019-06-16: qty 100

## 2019-06-16 MED ORDER — POTASSIUM CHLORIDE CRYS ER 20 MEQ PO TBCR
40.0000 meq | EXTENDED_RELEASE_TABLET | Freq: Once | ORAL | Status: AC
  Administered 2019-06-16: 40 meq via ORAL
  Filled 2019-06-16: qty 2

## 2019-06-16 MED ORDER — FLUCONAZOLE 100 MG PO TABS
100.0000 mg | ORAL_TABLET | Freq: Every day | ORAL | Status: DC
  Administered 2019-06-17 – 2019-06-24 (×8): 100 mg via ORAL
  Filled 2019-06-16 (×8): qty 1

## 2019-06-16 MED ORDER — POTASSIUM CHLORIDE 10 MEQ/100ML IV SOLN
10.0000 meq | INTRAVENOUS | Status: AC
  Administered 2019-06-16 (×4): 10 meq via INTRAVENOUS
  Filled 2019-06-16 (×4): qty 100

## 2019-06-16 MED ORDER — BOOST / RESOURCE BREEZE PO LIQD CUSTOM
1.0000 | Freq: Every day | ORAL | Status: DC
  Administered 2019-06-17 – 2019-06-30 (×5): 1 via ORAL

## 2019-06-16 MED ORDER — NYSTATIN 100000 UNIT/ML MT SUSP
5.0000 mL | Freq: Four times a day (QID) | OROMUCOSAL | Status: DC
  Administered 2019-06-16 – 2019-06-25 (×32): 500000 [IU] via ORAL
  Filled 2019-06-16 (×35): qty 5

## 2019-06-16 NOTE — Plan of Care (Signed)
  Problem: Clinical Measurements: Goal: Diagnostic test results will improve Outcome: Progressing   Problem: Coping: Goal: Level of anxiety will decrease Outcome: Progressing   

## 2019-06-16 NOTE — Progress Notes (Signed)
Pharmacy Antibiotic Note  Tony Saunders is a 64 y.o. male with B cell lymphoma on chemotherapy admitted on 06/15/2019 with weakness and pancytopenia.  Pharmacy has been consulted for fluconazole dosing for esophageal candidiasis.  Plan:  Fluconazole 200mg  IV x 1, then 100mg  PO daily (can change to IV if patient cannot swallow.  Recommended duration is 7-14 days  No further dose adjustments needed, Pharmacy will sign off  Nystatin swish and swallow QID per MD  Height: 6' (182.9 cm) Weight: 77.1 kg (170 lb) IBW/kg (Calculated) : 77.6  Temp (24hrs), Avg:98.4 F (36.9 C), Min:97.9 F (36.6 C), Max:99.2 F (37.3 C)  Recent Labs  Lab 06/15/19 1400  WBC 0.1*  CREATININE 0.80    Estimated Creatinine Clearance: 104.4 mL/min (by C-G formula based on SCr of 0.8 mg/dL).    No Known Allergies  Antimicrobials this admission: 5/29 Fluconazole >> 5/29 Nystatin >>  Dose adjustments this admission:  Microbiology results: None this admit  Thank you for allowing pharmacy to be a part of this patient's care.  Peggyann Juba, PharmD, BCPS Pharmacy: [phone] 06/16/2019 9:16 AM

## 2019-06-16 NOTE — Progress Notes (Signed)
Initial Nutrition Assessment  DOCUMENTATION CODES:   Not applicable  INTERVENTION:  Boost Breeze po daily with breakfast, each supplement provides 250 kcal and 9 grams of protein Ensure Enlive po BID, each supplement provides 350 kcal and 20 grams of protein Unjury chicken soup po BID, each packet provides 100 kcal and 21 grams of protein daily MVI with minerals daily  Monitor magnesium, potassium, and phosphorus daily for at least 3 days, MD to replete as needed, as pt is at high risk for refeeding syndrome given ongoing poor po intake, hypokalemia, hypomagnesemia noted per labs.    NUTRITION DIAGNOSIS:   Increased nutrient needs related to cancer and cancer related treatments as evidenced by estimated needs.    GOAL:   Patient will meet greater than or equal to 90% of their needs   MONITOR:   PO intake, Supplement acceptance, Labs, I & O's, Weight trends, Skin  REASON FOR ASSESSMENT:   Malnutrition Screening Tool    ASSESSMENT:  RD working remotely.  64 year old male referred for admission from outpatient clinic for hypotension and pancytopenia. Patient reports episodes of diarrhea, poor appetite, and weight loss since starting chemotherapy on 5/19 for newly diagnosed B-cell lymphoma. 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  Patient is on a regular diet, no documented intake for review at this time. Will continue to monitor po intakes and provide nutrition supplements to aid with meeting needs. Hypokalemia, hypomagnesemia noted, patient is at high risk for refeeding, monitor magnesium, potassium, and phosphorus daily for at least 3 days, MD to replete as needed.  Current wt 169.62 lb Per history, on 03/31/19 pt weighed 192.72 lb, on 04/16/19 he weighed 188.76 lb, on 05/25/19 pt weighed 181.94 lb. This indicates a 23 lb (12%) wt  loss over the past 3.5 months and 12.32 lb (6.8%) wt loss in 2.5 weeks which is significant. Patient noted with +4 BLE edema per RN assessment, expect wt loss is greater given fluid status. Given wt trends, history of present illness, and presence of sacral pressure injury present on admission, highly suspect malnutrition however unable to identify at this time.   Per notes: -Hgb improved s/p 2 PRBC -transfuse platelet if drop below 15 -oropharyngeal candidiasis, start diflucan, nystatin -aggressive repletion, check K/Mg/P in am -abdominal xray with colonic ileus; supportive care  I/Os: -1135 ml since admit UOP: 1800 ml since admit Medications reviewed and include: Keppra IVF: NaCl   Labs: Na 133 (L), K 2.3 (L), Mg 1.6 (L), WBC 0.2 (L), Hgb 8.1 (L)  NUTRITION - FOCUSED PHYSICAL EXAM: Unable to complete at this time, RD working remotely.    Diet Order:   Diet Order            Diet regular Room service appropriate? Yes; Fluid consistency: Thin  Diet effective now              EDUCATION NEEDS:   No education needs have been identified at this time  Skin:  Skin Assessment: Skin Integrity Issues: Skin Integrity Issues:: Stage I Stage I: sacrum  Last BM:  5/29 type 7  Height:   Ht Readings from Last 1 Encounters:  06/15/19 6' (1.829 m)    Weight:   Wt Readings from Last 1 Encounters:  06/15/19 77.1 kg   BMI:  Body mass index is 23.06 kg/m.  Estimated Nutritional Needs:   Kcal:  [id number]  Protein:  120-135  Fluid:  >/= 2.3 L/day   Lajuan Lines, RD, LDN Clinical Nutrition After Hours/Weekend Pager # in [place]

## 2019-06-16 NOTE — Progress Notes (Signed)
PROGRESS NOTE  Tony Saunders [id number] DOB: Oct 19, 1955 DOA: 06/15/2019 PCP: Erline Hau, MD  Brief History   64 year old man, diagnosed with diffuse B-cell lymphoma May 2021, started chemotherapy 5/19, presented for an outpatient port flush and radiation radiation simulation, found to be hypotensive and pancytopenic and referred for admission.  A & P  Pancytopenia secondary to chemotherapy, symptomatic anemia. --Hemoglobin improved appropriately with transfusion with 2 units PRBC  --Platelets slightly lower but no evidence of bleeding other than oozing from superficial left knee wound. --Seen by oncology, no need for G-CSF.  Received Udenyca 5/21. --High risk for infection given severe neutropenia.  If develop any signs or symptoms of infection, initiate work-up and empiric antibiotics. --Consider platelet transfusion if platelets drop below 15.  Oropharyngeal candidiasis secondary to chemotherapy --Impairing ability to eat.  Start Diflucan and nystatin.  Profound hypokalemia, hypomagnesemia --Replete aggressively.  Secondary to poor oral intake. --BMP this evening to assess response and need for further potassium. --Check potassium, magnesium and phosphorus in a.m.  Colonic ileus --Supportive care.  Replete potassium.  Diffuse B-cell lymphoma --Follow-up with oncology as an outpatient  Borderline hypotension over the last several weeks --Systolic blood pressure in the 90-100s.  Not a new finding.  No evidence of sepsis or even infection.  Somewhat improved with blood transfusion. --Hold Coreg for SBP less than 100 --Continue to monitor.  Diarrhea --None today.  Probably related to chemotherapy.    No signs or symptoms to suggest c diff.  Ischemic cardiomyopathy, combined systolic, diastolic CHF, LVEF [id number], severe mitral regurgitation, PAF. --Bilateral lower extremity edema -- more related to compression phenomenon from lymphoma than  CHF. --Continue carvedilol, Brilinta, Xarelto, benefit outweighs risk.  No evidence of bleeding other than oozing from superficial abrasion left knee.  Seizure disorder --Asymptomatic.    Continue Lamictal and Keppra.  Disposition Plan:  Discussion: Continue supportive care for pancytopenia, follow CBC closely, treat oropharyngeal candidiasis, hypokalemia and hypomagnesemia, supportive care for ileus.  Status is: Inpatient  Remains inpatient appropriate because:Persistent severe electrolyte disturbances   Dispo: The patient is from: Home              Anticipated d/c is to: Home              Anticipated d/c date is: 2 days              Patient currently is not medically stable to d/c.  DVT prophylaxis: rivaroxaban Code Status: Full Family Communication: none present    Murray Hodgkins, MD  [facility] Direct contact: see [email or web address] (further directions at bottom of note if needed) 7PM-7AM contact night coverage as at bottom of note 06/16/2019, 2:27 PM  LOS: 1 day   Significant Hospital Events    5/28 admitted for pancytopenia  Consults:   Oncology   Procedures:  .   Significant Diagnostic Tests:  Marland Kitchen    Micro Data:  .    Antimicrobials:  .   Interval History/Subjective  Feels okay today.  Main complaint is sore throat.  No abdominal pain.  No diarrhea today.  Objective   Vitals:  Vitals:   06/16/19 0558 06/16/19 0916  BP: (!) 94/53 (!) 101/57  Pulse: 100 96  Resp: (!) 22   Temp: 99.2 F (37.3 C)   SpO2: 94%     Exam:  Constitutional.  Appears calm, comfortable, weak, malnourished. ENT.  Hearing grossly normal.  Lips appear unremarkable.  There is now white exudate on the soft palate and  tongue. Respiratory.  Clear to auscultation bilaterally.  No wheezes, rales or rhonchi.  Normal respiratory effort. Cardiovascular.  Regular rate and rhythm.  No murmur, rub or gallop.  Slightly improved bilateral lower extremity edema left greater than right,  2+. Abdomen.  Soft, distended, nontender. Psychiatric.  Grossly normal mood and affect.  Speech fluent and appropriate.  I have personally reviewed the following:   Today's Data  . Potassium 3.3, sodium 133, magnesium 1.6 . WBC 8.2, hemoglobin 8.8 status post 2 units PRBC, platelets slightly low at 36 . Abdominal x-ray showed ileus  Scheduled Meds: . allopurinol  300 mg Oral Daily  . amiodarone  200 mg Oral BID  . carvedilol  3.125 mg Oral BID  . Chlorhexidine Gluconate Cloth  6 each Topical Daily  . [START ON 06/17/2019] fluconazole  100 mg Oral Daily  . lamoTRIgine  100 mg Oral BID  . levETIRAcetam  750 mg Oral BID  . midodrine  2.5 mg Oral TID WC  . nystatin  5 mL Oral QID  . rivaroxaban  20 mg Oral Q supper  . rosuvastatin  5 mg Oral QHS  . sertraline  25 mg Oral Daily  . sodium chloride flush  3 mL Intravenous Q12H  . sodium chloride flush  3 mL Intravenous Q12H  . ticagrelor  90 mg Oral BID   Continuous Infusions: . sodium chloride    . sodium chloride 50 mL/hr at 06/16/19 1020  . 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 Attending or Consulting provider 7A - 7P or covering provider during after hours [place], for this patient?  1. Check the care team in [facility] and look for a) attending/consulting [facility] provider listed and b) the [facility] team listed 2. Log into [email or web address] and use [place]'s universal password to access. If you do not have the password, please contact the  hospital operator. 3. Locate the [facility] provider you are looking for under [facility] and page to a number that you can be directly reached. 4. If you still have difficulty reaching the provider, please page the [facility] (Director on Call) for the Hospitalists listed on amion for assistance.

## 2019-06-16 NOTE — Progress Notes (Signed)
CRITICAL VALUE ALERT  Critical Value:  WBC 0.2, Potassium 2.3  Date & Time Notied: [id number]  06/16/19 via page  Provider Notified: Sarajane Jews  Orders Received/Actions taken: pending

## 2019-06-17 LAB — BASIC METABOLIC PANEL
Anion gap: 8 (ref 5–15)
Anion gap: 10 (ref 5–15)
BUN: 15 mg/dL (ref 8–23)
BUN: 15 mg/dL (ref 8–23)
CO2: 24 mmol/L (ref 22–32)
CO2: 24 mmol/L (ref 22–32)
Calcium: 7.3 mg/dL — ABNORMAL LOW (ref 8.9–10.3)
Calcium: 7.2 mg/dL — ABNORMAL LOW (ref 8.9–10.3)
Chloride: 103 mmol/L (ref 98–111)
Chloride: 102 mmol/L (ref 98–111)
Creatinine, Ser: 0.88 mg/dL (ref 0.61–1.24)
Creatinine, Ser: 0.89 mg/dL (ref 0.61–1.24)
GFR calc Af Amer: >60 mL/min (ref >60)
GFR calc Af Amer: >60 mL/min (ref >60)
GFR calc non Af Amer: >60 mL/min (ref >60)
GFR calc non Af Amer: >60 mL/min (ref >60)
Glucose, Bld: 79 mg/dL (ref 70–99)
Glucose, Bld: 110 mg/dL — ABNORMAL HIGH (ref 70–99)
Potassium: 3 mmol/L — ABNORMAL LOW (ref 3.5–5.1)
Potassium: 2.8 mmol/L — ABNORMAL LOW (ref 3.5–5.1)
Sodium: 135 mmol/L (ref 135–145)
Sodium: 136 mmol/L (ref 135–145)

## 2019-06-17 LAB — TYPE AND SCREEN
ABO/RH(D): O POS
Antibody Screen: NEGATIVE
Unit division: 0
Unit division: 0

## 2019-06-17 LAB — CBC WITH DIFFERENTIAL/PLATELET
Abs Immature Granulocytes: 0 10*3/uL (ref 0.00–0.07)
Basophils Absolute: 0 10*3/uL (ref 0.0–0.1)
Basophils Relative: 2 %
Eosinophils Absolute: 0 10*3/uL (ref 0.0–0.5)
Eosinophils Relative: 2 %
HCT: 25.8 % — ABNORMAL LOW (ref 39.0–52.0)
Hemoglobin: 8.2 g/dL — ABNORMAL LOW (ref 13.0–17.0)
Immature Granulocytes: 0 %
Lymphocytes Relative: 18 %
Lymphs Abs: 0.1 10*3/uL — ABNORMAL LOW (ref 0.7–4.0)
MCH: 26.1 pg (ref 26.0–34.0)
MCHC: 31.8 g/dL (ref 30.0–36.0)
MCV: 82.2 fL (ref 80.0–100.0)
Monocytes Absolute: 0.1 10*3/uL (ref 0.1–1.0)
Monocytes Relative: 11 %
Neutro Abs: 0.3 10*3/uL — ABNORMAL LOW (ref 1.7–7.7)
Neutrophils Relative %: 67 %
Platelets: 39 10*3/uL — ABNORMAL LOW (ref 150–400)
RBC: 3.14 MIL/uL — ABNORMAL LOW (ref 4.22–5.81)
RDW: 17.1 % — ABNORMAL HIGH (ref 11.5–15.5)
WBC: 0.4 10*3/uL — CL (ref 4.0–10.5)
nRBC: 0 % (ref 0.0–0.2)

## 2019-06-17 LAB — BPAM RBC
Blood Product Expiration Date: 202106302359
Blood Product Expiration Date: 202106302359
ISSUE DATE / TIME: 202105282236
ISSUE DATE / TIME: 202105290152
Unit Type and Rh: 5100
Unit Type and Rh: 5100

## 2019-06-17 LAB — MAGNESIUM: Magnesium: 1.9 mg/dL (ref 1.7–2.4)

## 2019-06-17 LAB — PHOSPHORUS: Phosphorus: 2.1 mg/dL — ABNORMAL LOW (ref 2.5–4.6)

## 2019-06-17 MED ORDER — FUROSEMIDE 10 MG/ML IJ SOLN
20.0000 mg | Freq: Two times a day (BID) | INTRAMUSCULAR | Status: AC
  Administered 2019-06-17: 20 mg via INTRAVENOUS
  Filled 2019-06-17: qty 2

## 2019-06-17 MED ORDER — SODIUM CHLORIDE 0.9 % IV BOLUS
500.0000 mL | Freq: Once | INTRAVENOUS | Status: AC
  Administered 2019-06-17: 500 mL via INTRAVENOUS

## 2019-06-17 MED ORDER — MORPHINE SULFATE (PF) 2 MG/ML IV SOLN
1.0000 mg | INTRAVENOUS | Status: DC | PRN
  Administered 2019-06-17 – 2019-06-18 (×5): 1 mg via INTRAVENOUS
  Filled 2019-06-17 (×6): qty 1

## 2019-06-17 MED ORDER — POTASSIUM CHLORIDE 10 MEQ/100ML IV SOLN
10.0000 meq | INTRAVENOUS | Status: AC
  Administered 2019-06-17 – 2019-06-18 (×6): 10 meq via INTRAVENOUS
  Filled 2019-06-17 (×6): qty 100

## 2019-06-17 MED ORDER — MAGNESIUM SULFATE 2 GM/50ML IV SOLN
2.0000 g | Freq: Once | INTRAVENOUS | Status: AC
  Administered 2019-06-17: 2 g via INTRAVENOUS
  Filled 2019-06-17: qty 50

## 2019-06-17 MED ORDER — POTASSIUM CHLORIDE 10 MEQ/100ML IV SOLN
10.0000 meq | INTRAVENOUS | Status: AC
  Administered 2019-06-17 (×4): 10 meq via INTRAVENOUS
  Filled 2019-06-17 (×4): qty 100

## 2019-06-17 MED ORDER — CALCIUM CARBONATE ANTACID 500 MG PO CHEW
1.0000 | CHEWABLE_TABLET | Freq: Three times a day (TID) | ORAL | Status: DC
  Administered 2019-06-17 – 2019-06-28 (×16): 200 mg via ORAL
  Filled 2019-06-17 (×27): qty 1

## 2019-06-17 MED ORDER — POTASSIUM PHOSPHATES 15 MMOLE/5ML IV SOLN
20.0000 mmol | Freq: Once | INTRAVENOUS | Status: AC
  Administered 2019-06-17: 20 mmol via INTRAVENOUS
  Filled 2019-06-17: qty 6.67

## 2019-06-17 NOTE — Progress Notes (Signed)
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  A & P  Pancytopenia secondary to chemotherapy, symptomatic anemia. Hemoglobin improved appropriately with transfusion with 2 units PRBC  --Hemoglobin stable today. --WBC improved today. Seen by oncology 5/28, no need for G-CSF.  Received Udenyca 5/21. --Platelets slightly improved.   --No evidence of infection at this time.  If develops fever or other signs of infection, initiate work-up and start empiric antibiotics. --Consider platelet transfusion if platelets drop below 15. --Continue carvedilol, Brilinta, Xarelto, benefit outweighs risk.  No evidence of bleeding other than oozing from superficial abrasion left knee.  Oropharyngeal candidiasis secondary to chemotherapy --Appears somewhat improved although still quite symptomatic.  Continue Diflucan, nystatin.  Hypokalemia, hypophosphatemia --Secondary to poor oral intake as an outpatient.  Replete aggressively, repeat BMP, magnesium and phosphorus in the morning.  Monitor for refeeding syndrome.  Colonic ileus --Asymptomatic yesterday when there was some right-sided pain.  Abdominal exam benign..  Aggressively replete potassium. --Repeat BMP this evening to assess for further need of potassium. --Goal potassium greater than 4, magnesium greater than 2  Diffuse B-cell lymphoma --Follow-up with oncology as an outpatient  Borderline hypotension over the last several weeks --Systolic blood pressure in the 90-100s.  Not a new finding.   --There remains no evidence of infection.  Blood pressure improved status post PRBC transfusion. --Hold Coreg for SBP less than  100 --Continue to monitor.  Diarrhea secondary to chemotherapy No signs or symptoms to suggest c diff.  Ischemic cardiomyopathy, combined systolic, diastolic CHF, LVEF [id number], severe mitral regurgitation, PAF. --Bilateral lower extremity edema in past thought more related to compressive phenomenon from lymphoma rather than CHF. --Seems to be worse now down, will trial Lasix.  Seizure disorder --Continue.    Continue Lamictal and Keppra.  Disposition Plan:  Discussion: We will continue monitoring of pancytopenia, aggressive repletion of electrolytes, monitoring of ileus, treatment for oropharyngeal candidiasis.  Status is: Inpatient  Remains inpatient appropriate because:Persistent severe electrolyte disturbances   Dispo: The patient is from: Home              Anticipated d/c is to: Home              Anticipated d/c date is: 2 days              Patient currently is not medically stable to d/c.  DVT prophylaxis: rivaroxaban Code Status: Full Family Communication: Updated wife at bedside  Murray Hodgkins, MD  [facility] Direct contact: see [email or web address] (further directions at bottom of note if needed) 7PM-7AM contact night coverage as at bottom of note 06/17/2019, 4:55 PM  LOS: 2 days   Significant Hospital Events    5/28 admitted for pancytopenia  Consults:   Oncology   Procedures:  .   Significant Diagnostic Tests:  Marland Kitchen    Micro Data:  .    Antimicrobials:  .   Interval History/Subjective  Throat feels a little bit better, has been able to tolerate a little bit of liquids.  Started to have some right-sided abdominal pain.  Continues to have some diarrhea.  Objective   Vitals:  Vitals:   06/17/19 0526 06/17/19 1512  BP: 108/65 100/63  Pulse: (!) 109 98  Resp: 18  20  Temp: 99.4 F (37.4 C) 98.3 F (36.8 C)  SpO2: 96% 98%    Exam:  Constitutional.  Appears calm and comfortable.  Appears chronically ill. ENT.  Hearing grossly normal.  Tongue  erythematous.  No exudate seen today. Respiratory.  Clear to auscultation bilaterally.  No wheezes, rales or rhonchi.  Normal respiratory effort. Cardiovascular.  Regular rate and rhythm.  No murmur, rub or gallop.  2-3+ bilateral lower extremity edema. Abdomen.  Soft, mild right sided abdominal pain without significant rebound or guarding.  Distended.  Tympanic to percussion. Skin.  Some bruising right dorsum of the foot.  Two bulla developing on the left dorsum of foot. Psychiatric.  Grossly normal mood and affect.  Speech fluent and appropriate.  I have personally reviewed the following:   Today's Data  . Potassium 3.0, remainder BMP unremarkable.  Phosphorus 2.1.  Magnesium 1.9. . WBC of 2.4.  Hemoglobin stable at 8.2.  Platelets stable at 39.  Scheduled Meds: . allopurinol  300 mg Oral Daily  . amiodarone  200 mg Oral BID  . carvedilol  3.125 mg Oral BID  . Chlorhexidine Gluconate Cloth  6 each Topical Daily  . feeding supplement  1 Container Oral Q breakfast  . feeding supplement (ENSURE ENLIVE)  237 mL Oral BID BM  . fluconazole  100 mg Oral Daily  . furosemide  20 mg Intravenous Q12H  . lamoTRIgine  100 mg Oral BID  . levETIRAcetam  750 mg Oral BID  . midodrine  2.5 mg Oral TID WC  . nystatin  5 mL Oral QID  . protein supplement  1 packet Oral BID  . rivaroxaban  20 mg Oral Q supper  . rosuvastatin  5 mg Oral QHS  . sertraline  25 mg Oral Daily  . sodium chloride flush  3 mL Intravenous Q12H  . sodium chloride flush  3 mL Intravenous Q12H  . ticagrelor  90 mg Oral BID   Continuous Infusions: . 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  1. Check the care team in [facility] and look for a) attending/consulting [facility] provider listed and b) the [facility] team listed 2. Log into [email or web address] and use 's universal password to access. If you do not have the password, please contact the hospital operator. 3. Locate the [facility] provider you are looking for under [facility] and page to a number that you can be directly reached. 4. If you still have difficulty reaching the provider, please page the [facility] (Director on Call) for the Hospitalists listed on amion for assistance.

## 2019-06-17 NOTE — Progress Notes (Signed)
CRITICAL VALUE ALERT  Critical Value:  WBC 0.4  Date & Time Notified:  06/17/19 [id number]  Provider Notified: Jonny Ruiz, NP  Orders Received/Actions taken:

## 2019-06-18 ENCOUNTER — Inpatient Hospital Stay (HOSPITAL_COMMUNITY): Payer: No Typology Code available for payment source

## 2019-06-18 DIAGNOSIS — I340 Nonrheumatic mitral (valve) insufficiency: Secondary | ICD-10-CM

## 2019-06-18 DIAGNOSIS — I5043 Acute on chronic combined systolic (congestive) and diastolic (congestive) heart failure: Secondary | ICD-10-CM

## 2019-06-18 DIAGNOSIS — I4892 Unspecified atrial flutter: Secondary | ICD-10-CM

## 2019-06-18 DIAGNOSIS — I4891 Unspecified atrial fibrillation: Secondary | ICD-10-CM

## 2019-06-18 LAB — CBC WITH DIFFERENTIAL/PLATELET
Abs Immature Granulocytes: 0.11 10*3/uL — ABNORMAL HIGH (ref 0.00–0.07)
Basophils Absolute: 0 10*3/uL (ref 0.0–0.1)
Basophils Relative: 1 %
Eosinophils Absolute: 0 10*3/uL (ref 0.0–0.5)
Eosinophils Relative: 1 %
HCT: 26.1 % — ABNORMAL LOW (ref 39.0–52.0)
Hemoglobin: 8.2 g/dL — ABNORMAL LOW (ref 13.0–17.0)
Immature Granulocytes: 8 %
Lymphocytes Relative: 8 %
Lymphs Abs: 0.1 10*3/uL — ABNORMAL LOW (ref 0.7–4.0)
MCH: 26.1 pg (ref 26.0–34.0)
MCHC: 31.4 g/dL (ref 30.0–36.0)
MCV: 83.1 fL (ref 80.0–100.0)
Monocytes Absolute: 0.2 10*3/uL (ref 0.1–1.0)
Monocytes Relative: 11 %
Neutro Abs: 1 10*3/uL — ABNORMAL LOW (ref 1.7–7.7)
Neutrophils Relative %: 71 %
Platelets: 54 10*3/uL — ABNORMAL LOW (ref 150–400)
RBC: 3.14 MIL/uL — ABNORMAL LOW (ref 4.22–5.81)
RDW: 17.2 % — ABNORMAL HIGH (ref 11.5–15.5)
WBC: 1.4 10*3/uL — CL (ref 4.0–10.5)
nRBC: 0 % (ref 0.0–0.2)

## 2019-06-18 LAB — BASIC METABOLIC PANEL
Anion gap: 8 (ref 5–15)
BUN: 14 mg/dL (ref 8–23)
CO2: 24 mmol/L (ref 22–32)
Calcium: 7.4 mg/dL — ABNORMAL LOW (ref 8.9–10.3)
Chloride: 102 mmol/L (ref 98–111)
Creatinine, Ser: 0.91 mg/dL (ref 0.61–1.24)
GFR calc Af Amer: >60 mL/min (ref >60)
GFR calc non Af Amer: >60 mL/min (ref >60)
Glucose, Bld: 97 mg/dL (ref 70–99)
Potassium: 3.4 mmol/L — ABNORMAL LOW (ref 3.5–5.1)
Sodium: 134 mmol/L — ABNORMAL LOW (ref 135–145)

## 2019-06-18 LAB — MRSA PCR SCREENING: MRSA by PCR: NEGATIVE

## 2019-06-18 LAB — MAGNESIUM: Magnesium: 2 mg/dL (ref 1.7–2.4)

## 2019-06-18 LAB — PHOSPHORUS: Phosphorus: 1.8 mg/dL — ABNORMAL LOW (ref 2.5–4.6)

## 2019-06-18 MED ORDER — POTASSIUM CHLORIDE CRYS ER 20 MEQ PO TBCR
40.0000 meq | EXTENDED_RELEASE_TABLET | Freq: Two times a day (BID) | ORAL | Status: DC
  Administered 2019-06-18 – 2019-06-22 (×9): 40 meq via ORAL
  Filled 2019-06-18 (×9): qty 2

## 2019-06-18 MED ORDER — AMIODARONE HCL IN DEXTROSE 360-4.14 MG/200ML-% IV SOLN
30.0000 mg/h | INTRAVENOUS | Status: DC
  Administered 2019-06-18 – 2019-06-22 (×7): 30 mg/h via INTRAVENOUS
  Filled 2019-06-18 (×7): qty 200

## 2019-06-18 MED ORDER — POTASSIUM CHLORIDE CRYS ER 20 MEQ PO TBCR
40.0000 meq | EXTENDED_RELEASE_TABLET | Freq: Once | ORAL | Status: AC
  Administered 2019-06-18: 40 meq via ORAL
  Filled 2019-06-18: qty 2

## 2019-06-18 MED ORDER — K PHOS MONO-SOD PHOS DI & MONO 155-852-130 MG PO TABS
250.0000 mg | ORAL_TABLET | Freq: Three times a day (TID) | ORAL | Status: DC
  Administered 2019-06-18 – 2019-06-19 (×5): 250 mg via ORAL
  Filled 2019-06-18 (×6): qty 1

## 2019-06-18 MED ORDER — MIRTAZAPINE 15 MG PO TBDP
15.0000 mg | ORAL_TABLET | Freq: Every day | ORAL | Status: DC
  Administered 2019-06-18 – 2019-06-29 (×12): 15 mg via ORAL
  Filled 2019-06-18 (×14): qty 1

## 2019-06-18 MED ORDER — AMIODARONE LOAD VIA INFUSION
150.0000 mg | Freq: Once | INTRAVENOUS | Status: AC
  Administered 2019-06-18: 150 mg via INTRAVENOUS
  Filled 2019-06-18: qty 83.34

## 2019-06-18 MED ORDER — ORAL CARE MOUTH RINSE
15.0000 mL | Freq: Two times a day (BID) | OROMUCOSAL | Status: DC
  Administered 2019-06-18 – 2019-06-30 (×21): 15 mL via OROMUCOSAL

## 2019-06-18 MED ORDER — METOPROLOL TARTRATE 5 MG/5ML IV SOLN: 2.5000 mg | INTRAVENOUS | Status: DC | PRN

## 2019-06-18 MED ORDER — FUROSEMIDE 10 MG/ML IJ SOLN
40.0000 mg | Freq: Two times a day (BID) | INTRAMUSCULAR | Status: DC
  Administered 2019-06-18 – 2019-06-19 (×2): 40 mg via INTRAVENOUS
  Filled 2019-06-18 (×2): qty 4

## 2019-06-18 MED ORDER — AMIODARONE HCL IN DEXTROSE 360-4.14 MG/200ML-% IV SOLN
60.0000 mg/h | INTRAVENOUS | Status: AC
  Administered 2019-06-18 (×2): 60 mg/h via INTRAVENOUS
  Filled 2019-06-18 (×2): qty 200

## 2019-06-18 MED ORDER — POTASSIUM PHOSPHATES 15 MMOLE/5ML IV SOLN
30.0000 mmol | Freq: Once | INTRAVENOUS | Status: AC
  Administered 2019-06-18: 30 mmol via INTRAVENOUS
  Filled 2019-06-18: qty 10

## 2019-06-18 NOTE — Progress Notes (Signed)
   06/18/19 1432  Assess: MEWS Score  Temp 98 F (36.7 C)  BP 102/79  Pulse Rate (!) 141  Resp 19  SpO2 98 %  O2 Device Room Air  Assess: MEWS Score  MEWS Temp 0  MEWS Systolic 0  MEWS Pulse 3  MEWS RR 0  MEWS LOC 0  MEWS Score 3  MEWS Score Color Yellow  Assess: if the MEWS score is Yellow or Red  Were vital signs taken at a resting state? Yes  Focused Assessment Documented focused assessment  Early Detection of Sepsis Score *See Row Information* Medium  MEWS guidelines implemented *See Row Information* No, previously yellow, continue vital signs every 4 hours  Treat  MEWS Interventions Other (Comment) (notified MD via page of elevated HR )  Notify: Provider  Provider Name/Title Pokhrel  Date Provider Notified 06/18/19  Time Provider Notified 1430  Notification Type Page  Notification Reason Other (Comment) (HR sustaining in 140s)
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
## 2019-06-18 NOTE — Progress Notes (Signed)
PROGRESS NOTE  Tony Saunders [id number] DOB: 1955/03/27 DOA: 06/15/2019 PCP: [name], [name], MD   LOS: 3 days   Brief narrative: As per HPI and previous provider,  64 year old male with diffuse B-cell lymphomaMay2021, started chemotherapy 5/19, presented for an outpatient port flushandradiation radiation simulation, found to be hypotensive and pancytopenic and was admitted to hospital for further evaluation and treatment.  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   Status post 3 units of packed RBC.  No need for G-CSF.  Oncology on board.  Hemoglobin stable today.  WBC of 1.4 from 0.5.--Platelets improved at 54 from 39.  No evidence of infection at this time.  On Coreg, Brilinta and Xarelto.  No evidence of active bleeding but mild petechiae and bruise on the leg.  On clear liquids.  Oropharyngeal candidiasis secondary to chemotherapy   Continue Diflucan, nystatin.  On clears.  Hypokalemia, hypophosphatemia Potassium at 3.4.  Magnesium 2.0.  Phosphorus is 1.8.  We will continue to replenish phosphate.  Secondary to poor oral intake.  Watch closely for refeeding syndrome.  Received potassium supplement potassium phosphate yesterday.  Repeat potassium phosphate supplements today.  Add Remeron for improving appetite  Colonic ileus Continue to replenish electrolytes.  Continue hydration. Goal potassium greater than 4, magnesium greater than 2.  Replacing K-Phos 30  mmol today.  Diffuse B-cell lymphoma Patient will need continued outpatient follow-up.  Patient was started on R-CHOP on Jun 06, 2019.  Plan for outpatient chemotherapy after discharge.  Borderline hypotension --On midodrine.  No evidence of infection.  Coreg with holding parameters.    Diarrhea secondary to chemotherapy No signs or symptoms to suggest c diff.  No diarrhea today.  Ischemic cardiomyopathy, combined systolic, diastolic CHF, LVEF [id number], severe mitral regurgitation, PAF. Received 1 dose of Lasix yesterday.  Will closely monitor electrolytes.  Seizure disorder --Continue.   Continue Lamictal and Keppra  VTE Prophylaxis: Xarelto  Code Status: Full code  Family Communication: Patient's spouse and daughters at bedside.  Status is: Inpatient  Remains inpatient appropriate because:Persistent severe electrolyte disturbances and Unsafe d/c plan   Dispo: The patient is from: Home              Anticipated d/c is to: Home              Anticipated d/c date is: 2 days              Patient currently is not medically stable to d/c.   Consultants:  Oncology  Procedures:  PRBC transfusion  Antibiotics/antifungals:  . Fluconazole  Anti-infectives (From admission, onward)   Start     Dose/Rate Route Frequency Ordered Stop   06/17/19 1000  fluconazole (DIFLUCAN) tablet 100 mg     100 mg Oral Daily 06/16/19 0915     06/16/19 1000  fluconazole (DIFLUCAN) IVPB 200 mg     200 mg 100 mL/hr over 60 Minutes Intravenous  Once 06/16/19 0912 06/16/19 1121       Subjective: Today, patient was seen and examined at bedside.  Complians  of pain over the lower abdominal area.  Has diminished appetite.  Complains of mild dry cough.  Objective: Vitals:   06/18/19 0314 06/18/19 0610  BP: (!) 83/56 (!) 85/60  Pulse: 90 (!) 105  Resp:  19  Temp:  97.9 F (36.6 C)  SpO2:  96%    Intake/Output Summary (Last 24 hours) at 06/18/2019 0754 Last data filed at 06/18/2019  0200 Gross per 24 hour  Intake 2261.59 ml  Output 275 ml  Net 1986.59 ml   Filed Weights   06/15/19 1559  Weight: 77.1 kg   Body mass index is 23.06 kg/m.   Physical Exam: GENERAL: Patient is alert awake and oriented. Not in obvious distress.  Chronically ill, thinly built HENT: Mild pallor noted pupils equally reactive to light. Oral mucosa is moist NECK: is supple, no gross swelling noted. CHEST  Diminished breath sounds bilaterally. CVS: S1 and S2 heard, no murmur. Regular rate and rhythm.  ABDOMEN: Soft, mildly tender over the lower abdomen, bowel sounds are present. EXTREMITIES: Bilateral lower extremity edema eating with some blisters. CNS: Cranial nerves are intact. No focal motor deficits. SKIN: warm and dry without rashes.  Right dorsal foot bruising and bulla on the left dorsum of the foot.  Data Review: I have personally reviewed the following laboratory data and studies,  CBC: Recent Labs  Lab 06/15/19 1400 06/16/19 0817 06/17/19 0300 06/18/19 0330  WBC 0.1* 0.2* 0.4* 1.4*  NEUTROABS 0.0* 0.0* 0.3* 1.0*  HGB 6.9* 8.1* 8.2* 8.2*  HCT 22.0* 25.3* 25.8* 26.1*  MCV 80.9 80.8 82.2 83.1  PLT 44* 36* 39* 54*   Basic Metabolic Panel: Recent Labs  Lab 06/16/19 0817 06/16/19 1053 06/16/19 1717 06/17/19 0300 06/17/19 1706 06/18/19 0330  NA 133*  --  132* 135 136 134*  K 2.3*  --  2.8* 3.0* 2.8* 3.4*  CL 99  --  99 103 102 102  CO2 25  --  25 24 24 24   GLUCOSE 79  --  82 79 110* 97  BUN 14  --  15 15 15 14   CREATININE 0.99  --  0.88 0.88 0.89 0.91  CALCIUM 7.5*  --  7.4* 7.3* 7.2* 7.4*  MG  --  1.6*  --  1.9  --  2.0  PHOS  --   --   --  2.1*  --  1.8*   Liver Function Tests: Recent Labs  Lab 06/15/19 1400  AST 18  ALT 23  ALKPHOS 67  BILITOT 0.8  PROT 4.5*  ALBUMIN 2.3*   No results for input(s): LIPASE, AMYLASE in the last 168 hours. No results for input(s): AMMONIA in the last 168 hours. Cardiac Enzymes: No results for input(s): CKTOTAL,  CKMB, CKMBINDEX, TROPONINI in the last 168 hours. BNP (last 3 results) No results for input(s): BNP in the last 8760 hours.  ProBNP (last 3 results) No results for input(s): PROBNP in the last 8760 hours.  CBG: No results for input(s): GLUCAP in the last 168 hours. Recent Results (from the past 240 hour(s))  SARS Coronavirus 2 by RT PCR (hospital order, performed in [facility] hospital lab) Nasopharyngeal Nasopharyngeal Swab     Status: None   Collection Time: 06/15/19  4:47 PM   Specimen: Nasopharyngeal Swab  Result Value Ref Range Status   SARS Coronavirus 2 NEGATIVE NEGATIVE Final    Comment: (NOTE) SARS-CoV-2 target nucleic acids are NOT DETECTED. The SARS-CoV-2 RNA is generally detectable in upper and lower respiratory specimens during  the acute phase of infection. The lowest concentration of SARS-CoV-2 viral copies this assay can detect is 250 copies / mL. A negative result does not preclude SARS-CoV-2 infection and should not be used as the sole basis for treatment or other patient management decisions.  A negative result may occur with improper specimen collection / handling, submission of specimen other than nasopharyngeal swab, presence of viral mutation(s) within the areas targeted by this assay, and inadequate number of viral copies (<250 copies / mL). A negative result must be combined with clinical observations, patient history, and epidemiological information. Fact Sheet for Patients:   StrictlyIdeas.no Fact Sheet for Healthcare Providers: BankingDealers.co.za This test is not yet approved or cleared  by the [place] FDA and has been authorized for detection and/or diagnosis of SARS-CoV-2 by FDA under an Emergency Use Authorization (EUA).  This EUA will remain in effect (meaning this test can be used) for the duration of the COVID-19 declaration under Section 564(b)(1) of the Act, 21 U.S.C. section 360bbb-3(b)(1),  unless the authorization is terminated or revoked sooner. Performed at [facility] [place] [address], [place], [place] [address]      Studies: No results found.    Flora Lipps, MD  [facility] 06/18/2019

## 2019-06-18 NOTE — Progress Notes (Signed)
   06/18/19 1507  Assess: MEWS Score  Temp (!) 97.4 F (36.3 C)  BP (!) 117/91  Pulse Rate (!) 140  Resp (!) 26  SpO2 96 %  O2 Device Room Air  Assess: MEWS Score  MEWS Temp 0  MEWS Systolic 0  MEWS Pulse 3  MEWS RR 2  MEWS LOC 0  MEWS Score 5  MEWS Score Color Red  Assess: if the MEWS score is Yellow or Red  Were vital signs taken at a resting state? Yes  Focused Assessment Documented focused assessment  Early Detection of Sepsis Score *See Row Information* Medium  MEWS guidelines implemented *See Row Information* Yes  Treat  MEWS Interventions Escalated (See documentation below)  Take Vital Signs  Increase Vital Sign Frequency  Red: Q 1hr X 4 then Q 4hr X 4, if remains red, continue Q 4hrs  Escalate  MEWS: Escalate Red: discuss with charge nurse/RN and provider, consider discussing with RRT  Notify: Charge Nurse/RN  Name of Charge Nurse/RN Notified Megan RN   Date Charge Nurse/RN Notified 06/18/19  Time Charge Nurse/RN Notified 24  Notify: Provider  Provider Name/Title Pokhrel  Date Provider Notified 06/18/19  Time Provider Notified 1520  Notification Type  (on unit to see patient )  Notification Reason Change in status;Other (Comment) (HR sustaining at 141)  Response See new orders

## 2019-06-18 NOTE — Progress Notes (Addendum)
Report given to [facility] RN in ICU, patient aware of transfer, notified pt's wife [name] of room number

## 2019-06-18 NOTE — Progress Notes (Signed)
CRITICAL VALUE ALERT  Critical Value:  WBC 1.4  Date & Time Notied:  06/18/19, [id number]  Provider Notified: X. Blount  Orders Received/Actions taken: waiting on order

## 2019-06-18 NOTE — Significant Event (Addendum)
Nursing staff reported that the patient had tachycardia with increased respiratory rate.  Attended the patient at bedside.  Patient denies any chest pain, dizziness lightheadedness but has mild dyspnea with coarse breathing.  Telemetry monitor showed sinus tachycardia.  EKG was obtained stat which showed sinus tachycardia at 140bpm.  Patient does have history of paroxysmal atrial fibrillation with significant cardiomyopathy from recent ischemia.  Follows up with Dr. Ellyn Hack cardiology as outpatient.  Patient is already on amiodarone, Coreg for atrial fibrillation.  He is also on midodrine due to borderline hypotension.  He was a started on amiodarone by Dr. Haroldine Laws heart failure team.    Due to dyspnea and congestion today, chest x-ray was obtained which showed increasing vascular congestion and enlarging pleural effusion.  Patient had received 1 dose of IV Lasix yesterday.  His electrolytes are impaired so will be getting more potassium and phosphate supplements today.  Spoke with the patient's wife at bedside the difficulty of diuresing with low electrolytes and borderline hypotension.  We will transfer the patient to stepdown unit for closer monitoring due to elevated MEW score.  Will consult cardiology.  Spoke with Dr. Debara Pickett cardiology.  Recommended changing amiodarone p.o. to IV amiodarone.  Will give 1 dose of IV Lasix.  We will continue midodrine for now.  Patient has multiple comorbidities and significant cardiac issues and I have requested the cardiology team to follow the patient today.

## 2019-06-18 NOTE — Progress Notes (Signed)
   06/17/19 2115  Vitals  Temp 97.8 F (36.6 C)  Temp Source Oral  BP (!) 88/55  MAP (mmHg) 65  BP Location Left Arm  BP Method Automatic  Patient Position (if appropriate) Lying  Pulse Rate 91  Pulse Rate Source Monitor  Resp 20  Oxygen Therapy  SpO2 97 %  O2 Device Room Air  Pain Assessment  Pain Scale 0-10  Pain Score 7  Pain Type Acute pain  Pain Location Flank  Pain Orientation Right  Pain Descriptors / Indicators Sharp  Pain Frequency Intermittent  Pain Onset On-going  Patients Stated Pain Goal 3  Pain Intervention(s) Medication (See eMAR)  MEWS Score  MEWS Temp 0  MEWS Systolic 1  MEWS Pulse 0  MEWS RR 0  MEWS LOC 0  MEWS Score 1  MEWS Score Color Green  Provider Notification  Provider Name/Title Jeannette Corpus  Date Provider Notified 06/17/19  Time Provider Notified 2118  Notification Type Page  Notification Reason  (low BP)

## 2019-06-19 ENCOUNTER — Inpatient Hospital Stay: Payer: No Typology Code available for payment source

## 2019-06-19 ENCOUNTER — Ambulatory Visit: Payer: No Typology Code available for payment source | Admitting: Radiation Oncology

## 2019-06-19 ENCOUNTER — Encounter (HOSPITAL_COMMUNITY): Payer: Self-pay | Admitting: Family Medicine

## 2019-06-19 ENCOUNTER — Inpatient Hospital Stay: Payer: Self-pay

## 2019-06-19 LAB — BASIC METABOLIC PANEL
Anion gap: 11 (ref 5–15)
BUN: 17 mg/dL (ref 8–23)
CO2: 24 mmol/L (ref 22–32)
Calcium: 7.7 mg/dL — ABNORMAL LOW (ref 8.9–10.3)
Chloride: 100 mmol/L (ref 98–111)
Creatinine, Ser: 0.98 mg/dL (ref 0.61–1.24)
GFR calc Af Amer: >60 mL/min (ref >60)
GFR calc non Af Amer: >60 mL/min (ref >60)
Glucose, Bld: 123 mg/dL — ABNORMAL HIGH (ref 70–99)
Potassium: 4.3 mmol/L (ref 3.5–5.1)
Sodium: 135 mmol/L (ref 135–145)

## 2019-06-19 LAB — CBC WITH DIFFERENTIAL/PLATELET
Abs Immature Granulocytes: 0.05 10*3/uL (ref 0.00–0.07)
Basophils Absolute: 0 10*3/uL (ref 0.0–0.1)
Basophils Relative: 1 %
Eosinophils Absolute: 0 10*3/uL (ref 0.0–0.5)
Eosinophils Relative: 0 %
HCT: 29.1 % — ABNORMAL LOW (ref 39.0–52.0)
Hemoglobin: 9 g/dL — ABNORMAL LOW (ref 13.0–17.0)
Immature Granulocytes: 1 %
Lymphocytes Relative: 7 %
Lymphs Abs: 0.2 10*3/uL — ABNORMAL LOW (ref 0.7–4.0)
MCH: 25.8 pg — ABNORMAL LOW (ref 26.0–34.0)
MCHC: 30.9 g/dL (ref 30.0–36.0)
MCV: 83.4 fL (ref 80.0–100.0)
Monocytes Absolute: 0.4 10*3/uL (ref 0.1–1.0)
Monocytes Relative: 11 %
Neutro Abs: 2.8 10*3/uL (ref 1.7–7.7)
Neutrophils Relative %: 80 %
Platelets: 99 10*3/uL — ABNORMAL LOW (ref 150–400)
RBC: 3.49 MIL/uL — ABNORMAL LOW (ref 4.22–5.81)
RDW: 17.6 % — ABNORMAL HIGH (ref 11.5–15.5)
WBC: 3.5 10*3/uL — ABNORMAL LOW (ref 4.0–10.5)
nRBC: 0 % (ref 0.0–0.2)

## 2019-06-19 LAB — MAGNESIUM: Magnesium: 1.9 mg/dL (ref 1.7–2.4)

## 2019-06-19 LAB — COOXEMETRY PANEL
Carboxyhemoglobin: 1.5 % (ref 0.5–1.5)
Methemoglobin: 0.6 % (ref 0.0–1.5)
O2 Saturation: 81.7 %
Total hemoglobin: 15.5 g/dL (ref 12.0–16.0)

## 2019-06-19 LAB — PHOSPHORUS: Phosphorus: 3.6 mg/dL (ref 2.5–4.6)

## 2019-06-19 LAB — LACTIC ACID, PLASMA: Lactic Acid, Venous: 1.8 mmol/L (ref 0.5–1.9)

## 2019-06-19 MED ORDER — METOPROLOL TARTRATE 25 MG PO TABS
25.0000 mg | ORAL_TABLET | Freq: Four times a day (QID) | ORAL | Status: DC
  Administered 2019-06-19: 25 mg via ORAL
  Filled 2019-06-19: qty 1

## 2019-06-19 MED ORDER — METOPROLOL SUCCINATE ER 25 MG PO TB24: 12.5000 mg | ORAL_TABLET | Freq: Every day | ORAL | Status: DC

## 2019-06-19 MED ORDER — FUROSEMIDE 10 MG/ML IJ SOLN: 40.0000 mg | Freq: Two times a day (BID) | INTRAMUSCULAR | Status: DC

## 2019-06-19 MED ORDER — METOPROLOL TARTRATE 12.5 MG HALF TABLET
12.5000 mg | ORAL_TABLET | Freq: Four times a day (QID) | ORAL | Status: DC
  Administered 2019-06-19 (×2): 12.5 mg via ORAL
  Filled 2019-06-19 (×2): qty 1

## 2019-06-19 NOTE — Progress Notes (Signed)
PROGRESS NOTE  Tony Saunders [id number] DOB: November 27, 1955 DOA: 06/15/2019 PCP: Tony Saunders, Tony Halsted, MD   LOS: 4 days   Brief narrative: As per HPI and previous provider,  64 year old male with diffuse B-cell lymphomaMay2021, started chemotherapy 5/19, presented for an outpatient port flushandradiation radiation simulation, found to be hypotensive and pancytopenic and was admitted to hospital for further evaluation and treatment.  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  Has been started on Lasix IV twice daily as per cardiology.  Spoke with cardiology.  Recommend PICC line placement for monitoring of right sided heart pressure and mixed venous oxygen saturation.  Cardiology recommend transfer to [facility] for further evaluation including collaboration with heart failure team but patient will likely need xrt as well. Spoke with cardiology about it.  On amiodarone drip and metoprolol.  Pancytopenia secondary to chemotherapy, symptomatic anemia.   Status post 3 units of packed RBC.  No need for G-CSF.  Oncology on board.  WBC, hemoglobin and platelets have improved.  H No evidence of infection at this time.  On Brilinta and Xarelto.  No evidence of active bleeding but mild petechiae and bruise on the leg.  Will need to closely monitor.  Oropharyngeal candidiasis secondary to chemotherapy   Continue Diflucan, nystatin.  On  clears.  Will advance to regular consistency possible due to impaired appetite..  Hypokalemia, hypophosphatemia Improved with aggressive replacement.  Check BMP phosphorus in a.m.  Colonic ileus Improved electrolytes.  Advance to regular diet.  Check lites in a.m.  Diffuse B-cell lymphoma Patient will need continued outpatient follow-up.  Patient was started on R-CHOP on Jun 06, 2019.  Plan for outpatient chemotherapy after discharge.  Borderline hypotension --On midodrine.  No evidence of infection.  He has been discontinued and started on metoprolol.  Diarrhea secondary to chemotherapy No signs or symptoms to suggest c diff.  No diarrhea reported.   Seizure disorder --Continue.   Continue Lamictal and Keppra  VTE Prophylaxis: Xarelto  Code Status: Full code  Family Communication: Spoke with the patient's wife [name] on the phone twice today and updated her about the clinical condition of the patient.  Status is: Inpatient  Remains inpatient appropriate because: cardiology evaluation and further treatment including advanced heart failure treatment.   Dispo: The patient is from: Home              Anticipated d/c is to: Home with home health              Anticipated d/c date is: 2 to 3 days.              Patient currently is not medically stable to d/c.   Consultants:  Oncology  Cardiology  Procedures:  PRBC transfusion  Antibiotics/antifungals:  . Fluconazole  Anti-infectives (From admission, onward)   Start     Dose/Rate Route Frequency Ordered Stop   06/17/19 1000  fluconazole (DIFLUCAN) tablet 100 mg     100 mg Oral Daily 06/16/19 0915  06/16/19 1000  fluconazole (DIFLUCAN) IVPB 200 mg     200 mg 100 mL/hr over 60 Minutes Intravenous  Once 06/16/19 0912 06/16/19 1121     Subjective: Today, patient was seen and examined at bedside.  Feels okay.  Had few bowel movements.  No chest pain.  Has a mild dry cough.  Denies any dizziness,  lightheadedness headache.  Objective: Vitals:   06/19/19 1045 06/19/19 1100  BP:  106/70  Pulse: (!) 120 (!) 119  Resp: (!) 29 (!) 26  Temp:    SpO2: 96% 97%    Intake/Output Summary (Last 24 hours) at 06/19/2019 1214 Last data filed at 06/19/2019 0600 Gross per 24 hour  Intake 860.24 ml  Output --  Net 860.24 ml   Filed Weights   06/15/19 1559 06/18/19 1700  Weight: 77.1 kg 83 kg   Body mass index is 24.82 kg/m.   Physical Exam: GENERAL: Patient is alert awake and oriented. Not in obvious distress.  Chronically ill, thinly built HENT: Mild pallor noted, pupils equally reactive to light. Oral mucosa is moist NECK: is supple, no gross swelling noted. CHEST  Diminished breath sounds bilaterally.  No obvious crackles noted. CVS: S1 and S2 heard, systolic murmur noted.  Irregular rhythm. ABDOMEN: Soft, mildly tender over the lower abdomen, bowel sounds are present. EXTREMITIES: Bilateral lower extremity edema 3+, some blisters. CNS: Cranial nerves are intact. No focal motor deficits. SKIN: warm and dry without rashes.  Right dorsal foot bruising and bulla on the left dorsum of the foot.  Petechiae noted.  Data Review: I have personally reviewed the following laboratory data and studies,  CBC: Recent Labs  Lab 06/15/19 1400 06/16/19 0817 06/17/19 0300 06/18/19 0330 06/19/19 0528  WBC 0.1* 0.2* 0.4* 1.4* 3.5*  NEUTROABS 0.0* 0.0* 0.3* 1.0* 2.8  HGB 6.9* 8.1* 8.2* 8.2* 9.0*  HCT 22.0* 25.3* 25.8* 26.1* 29.1*  MCV 80.9 80.8 82.2 83.1 83.4  PLT 44* 36* 39* 54* 99*   Basic Metabolic Panel: Recent Labs  Lab 06/16/19 0817 06/16/19 1053 06/16/19 1717 06/17/19 0300 06/17/19 1706 06/18/19 0330 06/19/19 0528  NA   < >  --  132* 135 136 134* 135  K   < >  --  2.8* 3.0* 2.8* 3.4* 4.3  CL   < >  --  99 103 102 102 100  CO2   < >  --  25 24 24 24 24   GLUCOSE   < >  --  82 79 110* 97 123*  BUN   < >  --  15 15 15 14 17   CREATININE   < >  --  0.88 0.88 0.89 0.91 0.98   CALCIUM   < >  --  7.4* 7.3* 7.2* 7.4* 7.7*  MG  --  1.6*  --  1.9  --  2.0 1.9  PHOS  --   --   --  2.1*  --  1.8* 3.6   < > = values in this interval not displayed.   Liver Function Tests: Recent Labs  Lab 06/15/19 1400  AST 18  ALT 23  ALKPHOS 67  BILITOT 0.8  PROT 4.5*  ALBUMIN 2.3*   No results for input(s): LIPASE, AMYLASE in the last 168 hours. No results for input(s): AMMONIA in the last 168 hours. Cardiac Enzymes: No results for input(s): CKTOTAL, CKMB, CKMBINDEX, TROPONINI in the last 168 hours. BNP (last 3 results) No results for input(s): BNP in the last 8760 hours.  ProBNP (  last 3 results) No results for input(s): PROBNP in the last 8760 hours.  CBG: No results for input(s): GLUCAP in the last 168 hours. Recent Results (from the past 240 hour(s))  SARS Coronavirus 2 by RT PCR (hospital order, performed in [facility] hospital lab) Nasopharyngeal Nasopharyngeal Swab     Status: None   Collection Time: 06/15/19  4:47 PM   Specimen: Nasopharyngeal Swab  Result Value Ref Range Status   SARS Coronavirus 2 NEGATIVE NEGATIVE Final    Comment: (NOTE) SARS-CoV-2 target nucleic acids are NOT DETECTED. The SARS-CoV-2 RNA is generally detectable in upper and lower respiratory specimens during the acute phase of infection. The lowest concentration of SARS-CoV-2 viral copies this assay can detect is 250 copies / mL. A negative result does not preclude SARS-CoV-2 infection and should not be used as the sole basis for treatment or other patient management decisions.  A negative result may occur with improper specimen collection / handling, submission of specimen other than nasopharyngeal swab, presence of viral mutation(s) within the areas targeted by this assay, and inadequate number of viral copies (<250 copies / mL). A negative result must be combined with clinical observations, patient history, and epidemiological information. Fact Sheet for Patients:    StrictlyIdeas.no Fact Sheet for Healthcare Providers: BankingDealers.co.za This test is not yet approved or cleared  by the [place] FDA and has been authorized for detection and/or diagnosis of SARS-CoV-2 by FDA under an Emergency Use Authorization (EUA).  This EUA will remain in effect (meaning this test can be used) for the duration of the COVID-19 declaration under Section 564(b)(1) of the Act, 21 U.S.C. section 360bbb-3(b)(1), unless the authorization is terminated or revoked sooner. Performed at [facility], [place] [address], [place], [place] [address]   MRSA PCR Screening     Status: None   Collection Time: 06/18/19  5:43 PM   Specimen: Nasal Mucosa; Nasopharyngeal  Result Value Ref Range Status   MRSA by PCR NEGATIVE NEGATIVE Final    Comment:        The GeneXpert MRSA Assay (FDA approved for NASAL specimens only), is one component of a comprehensive MRSA colonization surveillance program. It is not intended to diagnose MRSA infection nor to guide or monitor treatment for MRSA infections. Performed at [facility], [place] [address], [place], [place] [address]      Studies: DG CHEST PORT 1 VIEW  Result Date: 06/18/2019 CLINICAL DATA:  Dyspnea.  Chest congestion. EXAM: PORTABLE CHEST 1 VIEW COMPARISON:  06/15/2019 05/22/2019 FINDINGS: The patient has now developed bilateral perihilar interstitial pulmonary edema. Persistent large right pleural effusion. Increased left pleural effusion with atelectasis at the left lung base. Heart size and pulmonary vascularity are normal. PICC line in good position, unchanged just above the cavoatrial junction. IMPRESSION: 1. New bilateral perihilar interstitial pulmonary edema. 2. Persistent large right pleural effusion. 3. Increased left effusion and atelectasis. Electronically Signed   By: Lorriane Shire M.D.   On: 06/18/2019 16:14      Flora Lipps, MD  [facility] 06/19/2019

## 2019-06-19 NOTE — Progress Notes (Signed)
Spoke with patient's nurse Caryl Comes in ICU and was updated on patient's elevated HR, initiation of amiodarone drip, and labile BP. Updated Dr. Isidore Moos who stated it was fine to hold off starting radiation today. Plan to check on patient tomorrow to see if he is stable enough to start treatment then. [place] stated she would update patient's wife on plan.

## 2019-06-19 NOTE — Progress Notes (Addendum)
Progress Note  Patient Name: Tony Saunders Date of Encounter: 06/19/2019  Primary Cardiologist: Glenetta Hew, MD   Subjective   BP soft overnight but stable. MAP looks good. Patient unaware that he is in atrial flutter. No palpitations, lightheadedness, dizziness. No chest pain or shortness of breath. Lower extremity swelling stable.  Inpatient Medications    Scheduled Meds:  allopurinol  300 mg Oral Daily   calcium carbonate  1 tablet Oral TID   carvedilol  3.125 mg Oral BID   Chlorhexidine Gluconate Cloth  6 each Topical Daily   feeding supplement  1 Container Oral Q breakfast   feeding supplement (ENSURE ENLIVE)  237 mL Oral BID BM   fluconazole  100 mg Oral Daily   furosemide  40 mg Intravenous Q12H   lamoTRIgine  100 mg Oral BID   levETIRAcetam  750 mg Oral BID   mouth rinse  15 mL Mouth Rinse BID   midodrine  2.5 mg Oral TID WC   mirtazapine  15 mg Oral QHS   nystatin  5 mL Oral QID   phosphorus  250 mg Oral TID   potassium chloride  40 mEq Oral BID   protein supplement  1 packet Oral BID   rivaroxaban  20 mg Oral Q supper   rosuvastatin  5 mg Oral QHS   sertraline  25 mg Oral Daily   sodium chloride flush  3 mL Intravenous Q12H   sodium chloride flush  3 mL Intravenous Q12H   ticagrelor  90 mg Oral BID   Continuous Infusions:  sodium chloride     amiodarone 30 mg/hr (06/19/19 0600)   PRN Meds: sodium chloride, acetaminophen **OR** acetaminophen, metoprolol tartrate, morphine injection, nitroGLYCERIN, ondansetron **OR** ondansetron (ZOFRAN) IV, pantoprazole, sodium chloride flush, sodium chloride flush   Vital Signs    Vitals:   06/19/19 0400 06/19/19 0432 06/19/19 0534 06/19/19 0614  BP: (!) 81/63 (!) 88/62 (!) 92/59 (!) 82/64  Pulse: (!) 113 (!) 113 (!) 110 (!) 113  Resp: (!) 30 (!) 30 (!) 23 (!) 26  Temp:      TempSrc:      SpO2: 97% 98% 97% 96%  Weight:      Height:        Intake/Output Summary (Last 24 hours) at  06/19/2019 0718 Last data filed at 06/19/2019 0600 Gross per 24 hour  Intake 1100.24 ml  Output 350 ml  Net 750.24 ml   Last 3 Weights 06/18/2019 06/15/2019 06/12/2019  Weight (lbs) 182 lb 15.7 oz 170 lb 170 lb  Weight (kg) 83 kg 77.111 kg 77.111 kg      Telemetry    Atrial flutter with rates in the 110's to 120's. - Personally Reviewed  ECG    No new ECG tracing today. - Personally Reviewed  Physical Exam   GEN: No acute distress.   Neck: No JVD. Cardiac: Tachycardic with mostly regular rhythm. Systolic murmur noted with radiation to left axilla.  Respiratory: Coarse breath sounds bilaterally. Scattered wheezes.  GI: Soft, non-distended, and non-tender. MS: 3+ pitting edema of bilateral lower extremities (left > right). No deformity. Skin: Warm and dry. Petechiae noted.  Neuro:  No focal deficits.  Psych: Normal affect. Responds appropriately.  Labs    High Sensitivity Troponin:   Recent Labs  Lab 05/22/19 1603  TROPONINIHS 13      Chemistry Recent Labs  Lab 06/15/19 1400 06/16/19 0817 06/17/19 1706 06/18/19 0330 06/19/19 0528  NA 137   < >  136 134* 135  K 3.1*   < > 2.8* 3.4* 4.3  CL 103   < > 102 102 100  CO2 24   < > 24 24 24   GLUCOSE 91   < > 110* 97 123*  BUN 13   < > 15 14 17   CREATININE 0.80   < > 0.89 0.91 0.98  CALCIUM 7.9*   < > 7.2* 7.4* 7.7*  PROT 4.5*  --   --   --   --   ALBUMIN 2.3*  --   --   --   --   AST 18  --   --   --   --   ALT 23  --   --   --   --   ALKPHOS 67  --   --   --   --   BILITOT 0.8  --   --   --   --   GFRNONAA >60   < > >60 >60 >60  GFRAA >60   < > >60 >60 >60  ANIONGAP 10   < > 10 8 11    < > = values in this interval not displayed.     Hematology Recent Labs  Lab 06/17/19 0300 06/18/19 0330 06/19/19 0528  WBC 0.4* 1.4* 3.5*  RBC 3.14* 3.14* 3.49*  HGB 8.2* 8.2* 9.0*  HCT 25.8* 26.1* 29.1*  MCV 82.2 83.1 83.4  MCH 26.1 26.1 25.8*  MCHC 31.8 31.4 30.9  RDW 17.1* 17.2* 17.6*  PLT 39* 54* 99*    BNPNo  results for input(s): BNP, PROBNP in the last 168 hours.   DDimer No results for input(s): DDIMER in the last 168 hours.   Radiology    DG CHEST PORT 1 VIEW  Result Date: 06/18/2019 CLINICAL DATA:  Dyspnea.  Chest congestion. EXAM: PORTABLE CHEST 1 VIEW COMPARISON:  06/15/2019 05/22/2019 FINDINGS: The patient has now developed bilateral perihilar interstitial pulmonary edema. Persistent large right pleural effusion. Increased left pleural effusion with atelectasis at the left lung base. Heart size and pulmonary vascularity are normal. PICC line in good position, unchanged just above the cavoatrial junction. IMPRESSION: 1. New bilateral perihilar interstitial pulmonary edema. 2. Persistent large right pleural effusion. 3. Increased left effusion and atelectasis. Electronically Signed   By: Lorriane Shire M.D.   On: 06/18/2019 16:14    Cardiac Studies   Echocardiogram 05/14/2019: Impressions: 1. Compared to echo in March 2021, no significant change in LVEF or MR.  Pt is now in atrial fibrillation.  2. LVEF is severely depressed with severe hypokinesis/akinesis of the  base/mid inferior, inferosepal, infer walls and apical walls; hypokinesis  elsewhere . Left ventricular ejection fraction, by estimation, is 30%. The  left ventricle has severely  decreased function. The left ventricular internal cavity size was mildly  dilated. There is mild left ventricular hypertrophy. Left ventricular  diastolic parameters are indeterminate.  3. Right ventricular systolic function is normal. The right ventricular  size is normal. There is normal pulmonary artery systolic pressure.  4. Left atrial size was mildly dilated.  5. MR is directed posteriorly into LA, likely due to tethering of  posterior leaflet. Severe mitral valve regurgitation.  6. The aortic valve is tricuspid. Aortic valve regurgitation is not  visualized. Mild aortic valve sclerosis is present, with no evidence of  aortic valve  stenosis.  7. The inferior vena cava is normal in size with greater than 50%  respiratory variability, suggesting right atrial pressure of 3 mmHg.  Patient Profile     64 y.o. male with a history of CAD with recent STEMI in 03/2019 s/p DES x2 to RCA and DES to RPDA and then staged PCI with DES to LAD, DES x2 to CX, and DES to OM3; chronic systolic CHF/ ischemic cardiomyopathy with EF of 30% on Echo in 04/2019; severe mitral regurgitation, paroxsymal atrial fibrillation/flutter on Amiodarone and Xarelto; COPD; seizure disorder; and diffuse large B-cell lymphoma. Patient admitted on 06/15/2019 for hypotension and pancytopnenia secondary to chemotherapy. Cardiology consulted on 06/18/2019 for further evaluation of atrial fibrillation with RVR at the request of Dr. Louanne Belton.   Assessment & Plan    Atrial Fibrillation/Flutter with RVR - Remains in atrial flutter with rates in the 110's to 120's. - In the setting of pancytopenia secondary to chemotherapy. However, patient also has severe MR which may make this difficult to control. - Continue IV Amiodarone. - Discontinue Coreg due to hypotension. Will switch to metoprolol 12.5 mg qid and titrate as BP tolerates. - Continue chronic anticoagulation with Xarelto 20mg  daily.  Acute on Chronic Combined CHF - Likely being exacerbation by atrial fibrillation with RVR.  - Recent Echo on 05/14/2019 showed LVEF of 30% with multiple wall motion abnormalities as stated above. - Patient on IV Lasix. Only 350 mL of urinary output documented in the last 24 hours. Net positive 3 L. No documented weight for today but weight yesterday up 12 lbs from admission (unsure if this is accurate).  - Continue current dose of IV Lasix. - Coreg transitioned to Toprol-XL as above. - Consider digoxin if unable to tolerate metoprolol due to low BP - No ACEi/ARB/ARNI right now due to hypotension. - Monitor daily weights, strict I/O's, and renal function.  CAD  - s/p STEMI in  03/2019 treated with DES x2 to RCA and DES to RPDA and then staged PCI with DES to LAD, DES x2 to CX, and DES to OM3. - No angina.  - Continue Brilinta and statin. Continue beta-blocker if BP will allow.  Severe Mitral Regurgitation - Echo from 04/2018 showed severe MR directed posteriorly into left atrium. Likely due to tethering of posterior leaflet. - Unclear whether he would be a MitraClip candidate with his lymphoma.  Hypotensive - Systolic BP's in the 99991111 overnight but MAP looked good. - May need to increase Midodrine.   Electrolyte Abnormalities - Potassium as low as 2.3 on 5/29. Supplemented and stable at 4.3 today.  - Magnesium as low as 1.6. Supplemented and 1.9 today. - Phosphorous as low as 1.8. Improved to 3.6 today. - Continue to monitor.   Recently Diagnosed High-Grade Lymphoma - With extensive retroperitoneal, pelvic, and inguinal lymphadenopathy. There is also involvement of the spleen and near complete infiltration of the right kidney. 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 Suspicious for infiltrating disease of the right iliac and left pubic bones.  - On Chemo. - Management per Oncology and primary team.  Pancytopenia - S/p 3 units of PRBCs. - WBC, Hgb, and Plts all look better today. - Management per primary team.  Otherwise, per primary team. - Oropharyngeal candidiasis - Colonic ileus - Diarrhea - Seizure disorder    For questions or updates, please contact [facility] Please consult [email or web address] for contact info under        Signed, Darreld Mclean, PA-C  06/19/2019, 7:18 AM    Patient seen and examined.  Agree with above documentation.  On exam, patient is alert and oriented, tachycardia and irregular, 2/6 systolic murmur,  lungs with expiratory wheezing, 2+ BLE LE edema, + JVD.  Telemetry personally reviewed, and shows atrial flutter with rates 110-120s.  For his atrial fibrillation/flutter, would continue IV amiodarone.  Can continue  Xarelto for anticoagulation.  Will switch from coreg to lopressor for rate control.  Can start 12.5 mg qid and uptitrate as BP tolerates.  Can consider digoxin if no improvement, as suspect BP will likely not tolerate much beta-blocker.  Appears volume overloaded, would continue IV lasix.  He likely warrants another attempt at cardioversion since has been loaded with amiodarone since 5/11.  Will need diuresis to try and get more euvolemic prior to attempting another cardioversion.  Recommend checking CVP/Co-ox from PICC line and checking lactate.   Donato Heinz, MD

## 2019-06-19 NOTE — TOC Initial Note (Signed)
Transition of Care [facility]) - Initial/Assessment Note    Patient Details  Name: Tony Saunders MRN: [id number] Date of Birth: 03-Apr-1955  [facility] of [facility]) CM/SW Contact:    Leeroy Cha, RN Phone Number: 06/19/2019, 9:31 AM  Clinical Narrative:                 From home Has pcp and cardiologist Admitted with a,fib iv amiodarone Wbc-3.5  Expected Discharge Plan: Home/Self Care Barriers to Discharge: Continued Medical Work up   Patient Goals and CMS Choice Patient states their goals for this hospitalization and ongoing recovery are:: to go home CMS Medicare.gov Compare Post Acute Care list provided to:: Patient    Expected Discharge Plan and Services Expected Discharge Plan: Home/Self Care   Discharge Planning Services: CM Consult   Living arrangements for the past 2 months: Single Family Home                                      Prior Living Arrangements/Services Living arrangements for the past 2 months: Single Family Home Lives with:: Spouse Patient language and need for interpreter reviewed:: No        Need for Family Participation in Patient Care: Yes (Comment) Care giver support system in place?: Yes (comment)   Criminal Activity/Legal Involvement Pertinent to Current Situation/Hospitalization: No - Comment as needed  Activities of Daily Living Home Assistive Devices/Equipment: Environmental consultant (specify type), Wheelchair, Dentures (specify type)(full set dentures) ADL Screening (condition at time of admission) Patient's cognitive ability adequate to safely complete daily activities?: Yes Is the patient deaf or have difficulty hearing?: No Does the patient have difficulty seeing, even when wearing glasses/contacts?: No Does the patient have difficulty concentrating, remembering, or making decisions?: No Patient able to express need for assistance with ADLs?: Yes Does the patient have difficulty dressing or bathing?: Yes Independently performs  ADLs?: No Communication: Independent Dressing (OT): Needs assistance Is this a change from baseline?: Pre-admission baseline Grooming: Needs assistance Is this a change from baseline?: Pre-admission baseline Feeding: Independent Bathing: Needs assistance Is this a change from baseline?: Pre-admission baseline Toileting: Needs assistance Is this a change from baseline?: Pre-admission baseline In/Out Bed: Needs assistance Is this a change from baseline?: Pre-admission baseline Walks in Home: Needs assistance Is this a change from baseline?: Pre-admission baseline Does the patient have difficulty walking or climbing stairs?: Yes Weakness of Legs: Both Weakness of Arms/Hands: Both  Permission Sought/Granted                  Emotional Assessment Appearance:: Appears stated age Attitude/Demeanor/Rapport: Engaged Affect (typically observed): Calm Orientation: : Oriented to Self, Oriented to Place, Oriented to  Time, Oriented to Situation Alcohol / Substance Use: Not Applicable Psych Involvement: No (comment)  Admission diagnosis:  Pancytopenia ([place]) [D61.818] Symptomatic anemia [D64.9] Patient Active Problem List   Diagnosis Date Noted  . Pressure injury of skin 06/16/2019  . Oropharyngeal candidiasis 06/16/2019  . Hypokalemia 06/16/2019  . Ileus ([place]) 06/16/2019  . Pancytopenia ([place]) 06/15/2019  . Chronic combined systolic and diastolic heart failure ([place]) 06/13/2019  . 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 Atrial fibrillation with RVR ([place]) 05/23/2019  . Left leg swelling   . PAF (persistent-paroxysmal atrial fibrillation) ([place]) 04/16/2019  . Ischemic cardiomyopathy 04/16/2019  . Severe mitral regurgitation  by prior echocardiogram-likely ischemic with tethered posterior leaflet 03/31/2019  . Elevated transaminase level 03/31/2019  . 3vessel CAD- S/P  PCI 03/27/2019  . Acute ST elevation myocardial infarction (STEMI) of inferior wall ([place]) 03/27/2019  . Hyperlipidemia with target LDL less than 70 03/27/2019  . 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

## 2019-06-19 NOTE — Progress Notes (Signed)
Spoke with M. Sharlet Salina NP RE: pt hypotension. She stated OK for MAP of 55 or greater with goal MAP of 65. Held Coreg. NP aware.

## 2019-06-20 ENCOUNTER — Ambulatory Visit
Admission: RE | Admit: 2019-06-20 | Discharge: 2019-06-20 | Disposition: A | Discharge status: Home / Self Care | Payer: No Typology Code available for payment source | Source: Ambulatory Visit | Attending: Radiation Oncology | Admitting: Radiation Oncology

## 2019-06-20 LAB — COMPREHENSIVE METABOLIC PANEL
ALT: 29 U/L (ref 0–44)
AST: 24 U/L (ref 15–41)
Albumin: 2 g/dL — ABNORMAL LOW (ref 3.5–5.0)
Alkaline Phosphatase: 62 U/L (ref 38–126)
Anion gap: 11 (ref 5–15)
BUN: 20 mg/dL (ref 8–23)
CO2: 24 mmol/L (ref 22–32)
Calcium: 7.4 mg/dL — ABNORMAL LOW (ref 8.9–10.3)
Chloride: 102 mmol/L (ref 98–111)
Creatinine, Ser: 0.98 mg/dL (ref 0.61–1.24)
GFR calc Af Amer: >60 mL/min (ref >60)
GFR calc non Af Amer: >60 mL/min (ref >60)
Glucose, Bld: 107 mg/dL — ABNORMAL HIGH (ref 70–99)
Potassium: 3.7 mmol/L (ref 3.5–5.1)
Sodium: 137 mmol/L (ref 135–145)
Total Bilirubin: 0.8 mg/dL (ref 0.3–1.2)
Total Protein: 4.5 g/dL — ABNORMAL LOW (ref 6.5–8.1)

## 2019-06-20 LAB — MAGNESIUM: Magnesium: 1.7 mg/dL (ref 1.7–2.4)

## 2019-06-20 LAB — COOXEMETRY PANEL
Carboxyhemoglobin: 0.9 % (ref 0.5–1.5)
Methemoglobin: 0.7 % (ref 0.0–1.5)
O2 Saturation: 89.6 %
Total hemoglobin: 9.6 g/dL — ABNORMAL LOW (ref 12.0–16.0)

## 2019-06-20 LAB — PHOSPHORUS: Phosphorus: 3.6 mg/dL (ref 2.5–4.6)

## 2019-06-20 MED ORDER — FUROSEMIDE 10 MG/ML IJ SOLN
80.0000 mg | Freq: Two times a day (BID) | INTRAMUSCULAR | Status: DC
  Administered 2019-06-20: 80 mg via INTRAVENOUS
  Filled 2019-06-20: qty 8

## 2019-06-20 MED ORDER — MAGNESIUM OXIDE 400 (241.3 MG) MG PO TABS
400.0000 mg | ORAL_TABLET | Freq: Every day | ORAL | Status: DC
  Administered 2019-06-20 – 2019-06-22 (×3): 400 mg via ORAL
  Filled 2019-06-20 (×3): qty 1

## 2019-06-20 MED ORDER — METOPROLOL TARTRATE 12.5 MG HALF TABLET
12.5000 mg | ORAL_TABLET | Freq: Four times a day (QID) | ORAL | Status: DC
  Administered 2019-06-20 – 2019-06-21 (×3): 12.5 mg via ORAL
  Filled 2019-06-20 (×4): qty 1

## 2019-06-20 NOTE — Progress Notes (Addendum)
PROGRESS NOTE  Tony Saunders [id number] DOB: Nov 04, 1955 DOA: 06/15/2019 PCP: [name], [name], MD   LOS: 5 days   Brief narrative: As per HPI and previous provider,  64 year old male with diffuse B-cell lymphomaMay2021, started chemotherapy 5/19, presented for an outpatient port flushandradiation radiation simulation, found to be hypotensive and pancytopenic and was admitted to hospital for further evaluation and treatment.  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  Currently on hold..  Continue on amiodarone drip and metoprolol.  Follow cardiology recommendation.  Patient remains in atrial flutter.  Plan is to DC cardioversion tomorrow as per cardiology.  On Xarelto and Brilinta.  Pancytopenia secondary to chemotherapy, symptomatic anemia.   Status post 3 units of packed RBC.  improved at this time.  On Brilinta and Xarelto.  No evidence of active bleeding but mild petechiae and bruise on the leg.  Will need to closely monitor.  Oropharyngeal candidiasis secondary to chemotherapy On Diflucan, nystatin.  On clears.  Will advance to regular consistency possible due to impaired appetite..  Hypokalemia, hypophosphatemia Improved.  Magnesium of 1.7 phosphorus 3.6  and potassium of 3.7 today..  Colonic ileus Improved..  N.p.o.  Diffuse B-cell lymphoma Patient will need continued outpatient follow-up.  Patient was started on R-CHOP on Jun 06, 2019.  Plan for outpatient chemotherapy after discharge.  Borderline hypotension --On midodrine.  No evidence of infection.  On metoprolol, dose was decreased today.  Seizure disorder --Continue.   Continue Lamictal and Keppra  Severe protein calorie malnutrition. Present on admission. Nutrition services on board. Continue boost, ensure and magic cup  VTE Prophylaxis: Xarelto  Code Status: Full code  Family Communication: None today.  Spoke with the patient's wife on the phone yesterday.  Status is: Inpatient  Remains inpatient appropriate because: cardiology evaluation and further treatment including need for further cardio version, on IV antiarrhythmics, stepdown unit care, patient with multiple comorbidities including lymphoma needing radiation.   Dispo: The patient is from: Home              Anticipated d/c is to: Home with home health              Anticipated d/c date is: 2 to 3 days.              Patient currently is not medically stable to d/c.   Consultants:  Oncology  Cardiology  Procedures:  PRBC transfusion  Antibiotics/antifungals:  . Fluconazole  Anti-infectives (From admission, onward)   Start     Dose/Rate Route Frequency Ordered Stop   06/17/19 1000  fluconazole (DIFLUCAN) tablet 100 mg     100 mg Oral Daily 06/16/19 0915     06/16/19 1000  fluconazole (DIFLUCAN) IVPB 200 mg     200 mg 100 mL/hr over 60 Minutes Intravenous  Once 06/16/19 0912 06/16/19 1121     Subjective: Today,  patient was seen and examined at bedside.  Patient denies any dizziness, chest pain, shortness of breath, fever or chills.  Has mild cough.   Objective: Vitals:   06/20/19 0900 06/20/19 1200  BP: (!) 88/61   Pulse: (!) 55   Resp: (!) 24   Temp:  (!) 97 F (36.1 C)  SpO2: 100%      Intake/Output Summary (Last 24 hours) at 06/20/2019 1310 Last data filed at 06/20/2019 0700 Gross per 24 hour  Intake 785.06 ml  Output 1600 ml  Net -814.94 ml   Filed Weights   06/15/19 1559 06/18/19 1700  Weight: 77.1 kg 83 kg   Body mass index is 24.82 kg/m.   Physical Exam: GENERAL: Patient is alert awake and oriented. Not in obvious distress.  Chronically ill, thinly built HENT: Mild pallor noted, pupils equally reactive to light. Oral mucosa is moist NECK: is supple, no gross swelling noted. CHEST  Diminished breath sounds bilaterally.  No obvious crackles noted. CVS: S1 and S2 heard, systolic murmur noted.  Irregular rhythm. ABDOMEN: Soft, mildly tender over the lower abdomen, bowel sounds are present. EXTREMITIES: Bilateral lower extremity edema 3+, some blisters. CNS: Cranial nerves are intact. No focal motor deficits. SKIN: warm and dry without rashes.  Right dorsal foot bruising and bulla on the left dorsum of the foot.  Petechiae noted.  Data Review: I have personally reviewed the following laboratory data and studies,  CBC: Recent Labs  Lab 06/15/19 1400 06/16/19 0817 06/17/19 0300 06/18/19 0330 06/19/19 0528  WBC 0.1* 0.2* 0.4* 1.4* 3.5*  NEUTROABS 0.0* 0.0* 0.3* 1.0* 2.8  HGB 6.9* 8.1* 8.2* 8.2* 9.0*  HCT 22.0* 25.3* 25.8* 26.1* 29.1*  MCV 80.9 80.8 82.2 83.1 83.4  PLT 44* 36* 39* 54* 99*   Basic Metabolic Panel: Recent Labs  Lab 06/16/19 1053 06/16/19 1717 06/17/19 0300 06/17/19 1706 06/18/19 0330 06/19/19 0528 06/20/19 0500  NA  --    < > 135 136 134* 135 137  K  --    < > 3.0* 2.8* 3.4* 4.3 3.7  CL  --    < > 103 102 102 100 102  CO2  --    < > 24 24 24 24 24   GLUCOSE  --    < > 79 110* 97 123* 107*  BUN  --    < > 15 15 14 17 20   CREATININE  --    < > 0.88 0.89 0.91 0.98 0.98  CALCIUM  --    < > 7.3* 7.2* 7.4* 7.7* 7.4*  MG 1.6*  --  1.9  --  2.0 1.9 1.7  PHOS  --   --  2.1*  --  1.8* 3.6 3.6   < > = values in this interval not  displayed.   Liver Function Tests: Recent Labs  Lab 06/15/19 1400 06/20/19 0500  AST 18 24  ALT 23 29  ALKPHOS 67 62  BILITOT 0.8 0.8  PROT 4.5* 4.5*  ALBUMIN 2.3* 2.0*   No results for input(s): LIPASE, AMYLASE in the last 168 hours. No results for input(s): AMMONIA in the last 168 hours. Cardiac Enzymes: No results for input(s): CKTOTAL, CKMB, CKMBINDEX, TROPONINI in the last 168 hours. BNP (last 3 results) No results for input(s): BNP in the last 8760 hours.  ProBNP (last 3 results) No results for input(s): PROBNP in the last 8760 hours.  CBG: No results for input(s): GLUCAP in the last 168 hours. Recent Results (from the past  240 hour(s))  SARS Coronavirus 2 by RT PCR (hospital order, performed in [facility] hospital lab) Nasopharyngeal Nasopharyngeal Swab     Status: None   Collection Time: 06/15/19  4:47 PM   Specimen: Nasopharyngeal Swab  Result Value Ref Range Status   SARS Coronavirus 2 NEGATIVE NEGATIVE Final    Comment: (NOTE) SARS-CoV-2 target nucleic acids are NOT DETECTED. The SARS-CoV-2 RNA is generally detectable in upper and lower respiratory specimens during the acute phase of infection. The lowest concentration of SARS-CoV-2 viral copies this assay can detect is 250 copies / mL. A negative result does not preclude SARS-CoV-2 infection and should not be used as the sole basis for treatment or other patient management decisions.  A negative result may occur with improper specimen collection / handling, submission of specimen other than nasopharyngeal swab, presence of viral mutation(s) within the areas targeted by this assay, and inadequate number of viral copies (<250 copies / mL). A negative result must be combined with clinical observations, patient history, and epidemiological information. Fact Sheet for Patients:   StrictlyIdeas.no Fact Sheet for Healthcare Providers: BankingDealers.co.za This test  is not yet approved or cleared  by the [place] FDA and has been authorized for detection and/or diagnosis of SARS-CoV-2 by FDA under an Emergency Use Authorization (EUA).  This EUA will remain in effect (meaning this test can be used) for the duration of the COVID-19 declaration under Section 564(b)(1) of the Act, 21 U.S.C. section 360bbb-3(b)(1), unless the authorization is terminated or revoked sooner. Performed at [facility], [place] [address], [place], [place] [address]   MRSA PCR Screening     Status: None   Collection Time: 06/18/19  5:43 PM   Specimen: Nasal Mucosa; Nasopharyngeal  Result Value Ref Range Status   MRSA by PCR NEGATIVE NEGATIVE Final    Comment:        The GeneXpert MRSA Assay (FDA approved for NASAL specimens only), is one component of a comprehensive MRSA colonization surveillance program. It is not intended to diagnose MRSA infection nor to guide or monitor treatment for MRSA infections. Performed at [facility], [place] [address], [place], [place] [address]      Studies: DG CHEST PORT 1 VIEW  Result Date: 06/18/2019 CLINICAL DATA:  Dyspnea.  Chest congestion. EXAM: PORTABLE CHEST 1 VIEW COMPARISON:  06/15/2019 05/22/2019 FINDINGS: The patient has now developed bilateral perihilar interstitial pulmonary edema. Persistent large right pleural effusion. Increased left pleural effusion with atelectasis at the left lung base. Heart size and pulmonary vascularity are normal. PICC line in good position, unchanged just above the cavoatrial junction. IMPRESSION: 1. New bilateral perihilar interstitial pulmonary edema. 2. Persistent large right pleural effusion. 3. Increased left effusion and atelectasis. Electronically Signed   By: Lorriane Shire M.D.   On: 06/18/2019 16:14   [place] EKG SITE RITE  Result Date: 06/19/2019 If Site Rite image not attached, placement could not be confirmed due to current cardiac  rhythm.     Flora Lipps, MD  [facility] 06/20/2019

## 2019-06-20 NOTE — Progress Notes (Addendum)
Progress Note  Patient Name: Tony Saunders Date of Encounter: 06/20/2019  Primary Cardiologist: Glenetta Hew, MD   Subjective   No awareness of arrhythmia, denies being light-headed or dizzy, even w/ low BP No chest pain, breathing ok  Inpatient Medications    Scheduled Meds: . allopurinol  300 mg Oral Daily  . calcium carbonate  1 tablet Oral TID  . Chlorhexidine Gluconate Cloth  6 each Topical Daily  . feeding supplement  1 Container Oral Q breakfast  . feeding supplement (ENSURE ENLIVE)  237 mL Oral BID BM  . fluconazole  100 mg Oral Daily  . furosemide  80 mg Intravenous BID  . lamoTRIgine  100 mg Oral BID  . levETIRAcetam  750 mg Oral BID  . mouth rinse  15 mL Mouth Rinse BID  . metoprolol tartrate  25 mg Oral Q6H  . midodrine  2.5 mg Oral TID WC  . mirtazapine  15 mg Oral QHS  . nystatin  5 mL Oral QID  . potassium chloride  40 mEq Oral BID  . protein supplement  1 packet Oral BID  . rivaroxaban  20 mg Oral Q supper  . rosuvastatin  5 mg Oral QHS  . sertraline  25 mg Oral Daily  . sodium chloride flush  3 mL Intravenous Q12H  . sodium chloride flush  3 mL Intravenous Q12H  . ticagrelor  90 mg Oral BID   Continuous Infusions: . sodium chloride    . amiodarone 30 mg/hr (06/20/19 0710)   PRN Meds: sodium chloride, acetaminophen **OR** acetaminophen, metoprolol tartrate, morphine injection, nitroGLYCERIN, ondansetron **OR** ondansetron (ZOFRAN) IV, pantoprazole, sodium chloride flush, sodium chloride flush   Vital Signs    CVP: 06/01 was 4;  06/02 was 14 at 5 am  Vitals:   06/20/19 0500 06/20/19 0600 06/20/19 0800 06/20/19 0900  BP: 97/64 91/70 101/69 (!) 88/61  Pulse: (!) 109 (!) 106 (!) 110 (!) 55  Resp: (!) 23 (!) 24 (!) 22 (!) 24  Temp:      TempSrc:      SpO2: 96% 98% 98% 100%  Weight:      Height:        Intake/Output Summary (Last 24 hours) at 06/20/2019 0935 Last data filed at 06/20/2019 0700 Gross per 24 hour  Intake 785.06 ml  Output  1600 ml  Net -814.94 ml   Last 3 Weights 06/18/2019 06/15/2019 06/12/2019  Weight (lbs) 182 lb 15.7 oz 170 lb 170 lb  Weight (kg) 83 kg 77.111 kg 77.111 kg      Telemetry    Atrial flutter, HR approx 100, no sig ectopy - Personally Reviewed  ECG    No new ECG tracing today. - Personally Reviewed  Physical Exam   GEN: No acute distress. Chronically-ill appearing  Neck: Only mild JVD seen, despite elevated CVP Cardiac: Irreg R&R, soft murmur, no rubs, or gallops.  Respiratory: diminished to auscultation bilaterally with rales in the bases. GI: Soft, nontender, non-distended  MS: 2+ LE edema; No deformity. Very weak Neuro:  Nonfocal  Psych: Normal affect   Labs    High Sensitivity Troponin:   Recent Labs  Lab 05/22/19 1603  TROPONINIHS 13      Chemistry Recent Labs  Lab 06/15/19 1400 06/16/19 0817 06/18/19 0330 06/19/19 0528 06/20/19 0500  NA 137   < > 134* 135 137  K 3.1*   < > 3.4* 4.3 3.7  CL 103   < > 102 100 102  CO2 24   < > 24 24 24   GLUCOSE 91   < > 97 123* 107*  BUN 13   < > 14 17 20   CREATININE 0.80   < > 0.91 0.98 0.98  CALCIUM 7.9*   < > 7.4* 7.7* 7.4*  PROT 4.5*  --   --   --  4.5*  ALBUMIN 2.3*  --   --   --  2.0*  AST 18  --   --   --  24  ALT 23  --   --   --  29  ALKPHOS 67  --   --   --  62  BILITOT 0.8  --   --   --  0.8  GFRNONAA >60   < > >60 >60 >60  GFRAA >60   < > >60 >60 >60  ANIONGAP 10   < > 8 11 11    < > = values in this interval not displayed.     Hematology Recent Labs  Lab 06/17/19 0300 06/18/19 0330 06/19/19 0528  WBC 0.4* 1.4* 3.5*  RBC 3.14* 3.14* 3.49*  HGB 8.2* 8.2* 9.0*  HCT 25.8* 26.1* 29.1*  MCV 82.2 83.1 83.4  MCH 26.1 26.1 25.8*  MCHC 31.8 31.4 30.9  RDW 17.1* 17.2* 17.6*  PLT 39* 54* 99*    BNPNo results for input(s): BNP, PROBNP in the last 168 hours.   Lactic Acid, Venous    Component Value Date/Time   LATICACIDVEN 1.8 06/19/2019 1503   Magnesium  Date Value Ref Range Status  06/20/2019 1.7  1.7 - 2.4 mg/dL Final    Comment:    Performed at [facility], [place] [address] [place], [place] [address]  06/19/2019 1.9 1.7 - 2.4 mg/dL Final    Comment:    Performed at [facility], [place] [address], [place], [place] [address]  06/18/2019 2.0 1.7 - 2.4 mg/dL Final    Comment:    Performed at [facility], [place] [address], [place], [place] [address]   Phosphorus  Date Value Ref Range Status  06/20/2019 3.6 2.5 - 4.6 mg/dL Final    Comment:    Performed at [facility], [place] [address], [place], [place] [address]      Radiology    DG CHEST PORT 1 VIEW  Result Date: 06/18/2019 CLINICAL DATA:  Dyspnea.  Chest congestion. EXAM: PORTABLE CHEST 1 VIEW COMPARISON:  06/15/2019 05/22/2019 FINDINGS: The patient has now developed bilateral perihilar interstitial pulmonary edema. Persistent large right pleural effusion. Increased left pleural effusion with atelectasis at the left lung base. Heart size and pulmonary vascularity are normal. PICC line in good position, unchanged just above the cavoatrial junction. IMPRESSION: 1. New bilateral perihilar interstitial pulmonary edema. 2. Persistent large right pleural effusion. 3. Increased left effusion and atelectasis. Electronically Signed   By: Lorriane Shire M.D.   On: 06/18/2019 16:14   [place] EKG SITE RITE  Result Date: 06/19/2019 If Site Rite image not attached, placement could not be confirmed due to current cardiac rhythm.   Cardiac Studies   Echocardiogram 05/14/2019: Impressions: 1. Compared to echo in March 2021, no significant change in LVEF or MR.  Pt is now in atrial fibrillation.  2. LVEF is severely depressed with severe hypokinesis/akinesis of the  base/mid inferior, inferosepal, infer walls and apical walls; hypokinesis  elsewhere . Left ventricular ejection fraction, by estimation, is 30%. The  left ventricle has severely  decreased function.  The left  ventricular internal cavity size was mildly  dilated. There is mild left ventricular hypertrophy. Left ventricular  diastolic parameters are indeterminate.  3. Right ventricular systolic function is normal. The right ventricular  size is normal. There is normal pulmonary artery systolic pressure.  4. Left atrial size was mildly dilated.  5. MR is directed posteriorly into LA, likely due to tethering of  posterior leaflet. Severe mitral valve regurgitation.  6. The aortic valve is tricuspid. Aortic valve regurgitation is not  visualized. Mild aortic valve sclerosis is present, with no evidence of  aortic valve stenosis.  7. The inferior vena cava is normal in size with greater than 50%  respiratory variability, suggesting right atrial pressure of 3 mmHg.   Patient Profile     64 y.o. male with a history of CAD with recent STEMI in 03/2019 s/p DES x2 to RCA and DES to RPDA and then staged PCI with DES to LAD, DES x2 to CX, and DES to OM3; chronic systolic CHF/ ischemic cardiomyopathy with EF of 30% on Echo in 04/2019; severe mitral regurgitation, paroxsymal atrial fibrillation/flutter on Amiodarone and Xarelto; COPD; seizure disorder; and diffuse large B-cell lymphoma. Patient admitted on 06/15/2019 for hypotension and pancytopnenia secondary to chemotherapy. Cardiology consulted on 06/18/2019 for further evaluation of atrial fibrillation with RVR at the request of Dr. Louanne Belton.   Assessment & Plan    Atrial Fibrillation/Flutter with RVR - continues in flutter, rate generally controlled - metoprolol 25 mg q 6 h held this am 2nd low BP, will decrease dose and liberalize parameters - on IV amio - on Xarelto 20 mg qd - will plan DCCV tomorrow  Acute on Chronic Combined CHF - EF still 30% by echo this admit - I/O net +2.6 L since admit, but UOP 1600 cc yesterday w/ Lasix 40 mg x 1 dose - now on Lasix 80 mg, BP dropped this am after dose, follow - need daily wts - continue to follow CVP -  continue metoprolol as above - BP will not tolerate ACE/ARB/ARNI - CVP 6 today, will hold PM IV lasix dose and plan to restart PO lasix tomorrow  CAD, s/p STEMI in 03/2019 treated with DES x2 to RCA and DES to RPDA and then staged PCI with DES to LAD, DES x2 to CX, and DES to OM3. - on Brilinta, no ASA 2nd Xarelto - on BB and statin  Severe Mitral Regurgitation - see echo results above, is likely due to tethering of posterior leaflet - once he recovers, consider Mitraclip eval  Hypotensive - SBP 80s-100s, on midodrine 2.5 mg tid - MAP ok, asymptomatic - discuss w/ MD if increasing midodrine would allow more consistent BB therapy  Electrolyte Abnormalities - K+ nadir 2.3 on 05/29, rec'd IV/PO supplement - now on KCl 40 meq bid - K+ 3.7 today - Mg nadir 1.6, s/p IV supp but trending down, start Mag-ox 400 mg qd - Phos also low, s/p supp and improved  Recently Diagnosed High-Grade Lymphoma With extensive retroperitoneal, pelvic, and inguinal lymphadenopathy. There is also involvement of the spleen and near complete infiltration of the right kidney. 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 Suspicious for infiltrating disease of the right iliac and left pubic bones.  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  Agree with above documentation.  On exam, patient is alert and oriented, irregular rate and tachycardic, no murmurs, lungs CTAB, no JVD, 2+ BLE edema.  CVP improved to 6 today.  Continues to have LE edema (likely from lymphoma) but otherwise appears euvolemic.  Will hold PM IV lasix and plan to start PO lasix tomorrow.  He remains in AFL with rate 100-130s.  Has not been able to tolerate metoprolol due to soft BP.   Will continue IV amiodarone.  I think he would benefit from another attempt at cardioversion as he has been loaded with amiodarone since 5/11.  I spoke with his wife who manages his medications, and she confirms no missed Xarelto doses in last 3 weeks.  DCCV would commit him to 1 month of uninterrupted anticoagulation.  I discussed with his hematologist Dr Alen Blew as he had thrombocytopenia on admission but recovered quickly.  Dr Alen Blew said there is no contraindication to proceeding with anticoagulation as drop in platelet count with this chemo regimen is usually transient and not associated with bleeding.  Will plan for DCCV tomorrow.  Donato Heinz, MD

## 2019-06-20 NOTE — Progress Notes (Signed)
   Asked to schedule DCCV.  No room on the schedule for tomorrow, he is on for 8 am Friday, June 4th at [facility] lab.   Orders written.  Rosaria Ferries, PA-C 06/20/2019 12:34 PM

## 2019-06-21 ENCOUNTER — Ambulatory Visit
Admission: RE | Admit: 2019-06-21 | Discharge: 2019-06-21 | Disposition: A | Discharge status: Home / Self Care | Payer: No Typology Code available for payment source | Source: Ambulatory Visit | Attending: Radiation Oncology | Admitting: Radiation Oncology

## 2019-06-21 DIAGNOSIS — I4892 Unspecified atrial flutter: Secondary | ICD-10-CM

## 2019-06-21 DIAGNOSIS — E43 Unspecified severe protein-calorie malnutrition: Secondary | ICD-10-CM | POA: Insufficient documentation

## 2019-06-21 LAB — CBC
HCT: 30 % — ABNORMAL LOW (ref 39.0–52.0)
Hemoglobin: 9 g/dL — ABNORMAL LOW (ref 13.0–17.0)
MCH: 25.5 pg — ABNORMAL LOW (ref 26.0–34.0)
MCHC: 30 g/dL (ref 30.0–36.0)
MCV: 85 fL (ref 80.0–100.0)
Platelets: 158 10*3/uL (ref 150–400)
RBC: 3.53 MIL/uL — ABNORMAL LOW (ref 4.22–5.81)
RDW: 17.8 % — ABNORMAL HIGH (ref 11.5–15.5)
WBC: 4 10*3/uL (ref 4.0–10.5)
nRBC: 0 % (ref 0.0–0.2)

## 2019-06-21 LAB — COMPREHENSIVE METABOLIC PANEL
ALT: 32 U/L (ref 0–44)
AST: 36 U/L (ref 15–41)
Albumin: 2 g/dL — ABNORMAL LOW (ref 3.5–5.0)
Alkaline Phosphatase: 66 U/L (ref 38–126)
Anion gap: 11 (ref 5–15)
BUN: 19 mg/dL (ref 8–23)
CO2: 25 mmol/L (ref 22–32)
Calcium: 7.7 mg/dL — ABNORMAL LOW (ref 8.9–10.3)
Chloride: 101 mmol/L (ref 98–111)
Creatinine, Ser: 1.06 mg/dL (ref 0.61–1.24)
GFR calc Af Amer: >60 mL/min (ref >60)
GFR calc non Af Amer: >60 mL/min (ref >60)
Glucose, Bld: 155 mg/dL — ABNORMAL HIGH (ref 70–99)
Potassium: 3.9 mmol/L (ref 3.5–5.1)
Sodium: 137 mmol/L (ref 135–145)
Total Bilirubin: 0.6 mg/dL (ref 0.3–1.2)
Total Protein: 4.7 g/dL — ABNORMAL LOW (ref 6.5–8.1)

## 2019-06-21 LAB — COOXEMETRY PANEL
Carboxyhemoglobin: 1.8 % — ABNORMAL HIGH (ref 0.5–1.5)
Methemoglobin: 0.4 % (ref 0.0–1.5)
O2 Saturation: 78.1 %
Total hemoglobin: 9.2 g/dL — ABNORMAL LOW (ref 12.0–16.0)

## 2019-06-21 LAB — MAGNESIUM: Magnesium: 1.8 mg/dL (ref 1.7–2.4)

## 2019-06-21 LAB — PHOSPHORUS: Phosphorus: 3 mg/dL (ref 2.5–4.6)

## 2019-06-21 MED ORDER — FUROSEMIDE 40 MG PO TABS
40.0000 mg | ORAL_TABLET | Freq: Every day | ORAL | Status: DC
  Administered 2019-06-21 – 2019-06-23 (×3): 40 mg via ORAL
  Filled 2019-06-21 (×3): qty 1

## 2019-06-21 NOTE — Progress Notes (Addendum)
Progress Note  Patient Name: Tony Saunders Date of Encounter: 06/21/2019  Primary Cardiologist: Glenetta Hew, MD   Subjective   A little light-headed this am, no chest pain or SOB  Inpatient Medications    Scheduled Meds:  allopurinol  300 mg Oral Daily   calcium carbonate  1 tablet Oral TID   Chlorhexidine Gluconate Cloth  6 each Topical Daily   feeding supplement  1 Container Oral Q breakfast   feeding supplement (ENSURE ENLIVE)  237 mL Oral BID BM   fluconazole  100 mg Oral Daily   lamoTRIgine  100 mg Oral BID   levETIRAcetam  750 mg Oral BID   magnesium oxide  400 mg Oral Daily   mouth rinse  15 mL Mouth Rinse BID   metoprolol tartrate  12.5 mg Oral Q6H   midodrine  2.5 mg Oral TID WC   mirtazapine  15 mg Oral QHS   nystatin  5 mL Oral QID   potassium chloride  40 mEq Oral BID   protein supplement  1 packet Oral BID   rivaroxaban  20 mg Oral Q supper   rosuvastatin  5 mg Oral QHS   sertraline  25 mg Oral Daily   sodium chloride flush  3 mL Intravenous Q12H   sodium chloride flush  3 mL Intravenous Q12H   ticagrelor  90 mg Oral BID   Continuous Infusions:  sodium chloride     amiodarone 30 mg/hr (06/21/19 0631)   PRN Meds: sodium chloride, acetaminophen **OR** acetaminophen, metoprolol tartrate, morphine injection, nitroGLYCERIN, ondansetron **OR** ondansetron (ZOFRAN) IV, pantoprazole, sodium chloride flush, sodium chloride flush   Vital Signs    CVP: 06/01 was 4;  06/02 was 14 at 5 am>>6>>7>>9>>9>>12 at 5 am 06/03  Vitals:   06/21/19 0700 06/21/19 0800 06/21/19 0900 06/21/19 1000  BP: 111/69 98/70 103/71 (!) 142/125  Pulse: (!) 110 (!) 117 (!) 125 (!) 117  Resp: (!) 24 (!) 26 (!) 24 20  Temp:  97.6 F (36.4 C)    TempSrc:  Oral    SpO2: 99% 97% 97% 99%  Weight:      Height:        Intake/Output Summary (Last 24 hours) at 06/21/2019 1022 Last data filed at 06/21/2019 0543 Gross per 24 hour  Intake 328.21 ml  Output  1650 ml  Net -1321.79 ml   Last 3 Weights 06/21/2019 06/18/2019 06/15/2019  Weight (lbs) 178 lb 9.2 oz 182 lb 15.7 oz 170 lb  Weight (kg) 81 kg 83 kg 77.111 kg      Telemetry    Atrial flutter, HR elevated this am - Personally Reviewed  ECG    No new ECG tracing today. - Personally Reviewed  Physical Exam   GEN: No acute distress.   Neck: No JVD Cardiac: RRR, no murmurs, rubs, or gallops.  Respiratory: diminished to auscultation bilaterally with rales in the bases. GI: Soft, nontender, non-distended  MS: No edema; No deformity. Neuro:  Nonfocal  Psych: Normal affect   GEN: No acute distress. Chronically-ill appearing  Neck: Only mild JVD seen, despite elevated CVP Cardiac: Irreg R&R, soft murmur, no rubs, or gallops.  Respiratory: diminished to auscultation bilaterally with rales in the bases. GI: Soft, nontender, non-distended  MS: 2+ LE edema; No deformity. Very weak Neuro:  Nonfocal  Psych: Normal affect   Labs    High Sensitivity Troponin:   Recent Labs  Lab 05/22/19 1603  TROPONINIHS 13      Chemistry  Recent Labs  Lab 06/15/19 1400 06/16/19 0817 06/19/19 0528 06/20/19 0500 06/21/19 0532  NA 137   < > 135 137 137  K 3.1*   < > 4.3 3.7 3.9  CL 103   < > 100 102 101  CO2 24   < > 24 24 25   GLUCOSE 91   < > 123* 107* 155*  BUN 13   < > 17 20 19   CREATININE 0.80   < > 0.98 0.98 1.06  CALCIUM 7.9*   < > 7.7* 7.4* 7.7*  PROT 4.5*  --   --  4.5* 4.7*  ALBUMIN 2.3*  --   --  2.0* 2.0*  AST 18  --   --  24 36  ALT 23  --   --  29 32  ALKPHOS 67  --   --  62 66  BILITOT 0.8  --   --  0.8 0.6  GFRNONAA >60   < > >60 >60 >60  GFRAA >60   < > >60 >60 >60  ANIONGAP 10   < > 11 11 11    < > = values in this interval not displayed.     Hematology Recent Labs  Lab 06/18/19 0330 06/19/19 0528 06/21/19 0532  WBC 1.4* 3.5* 4.0  RBC 3.14* 3.49* 3.53*  HGB 8.2* 9.0* 9.0*  HCT 26.1* 29.1* 30.0*  MCV 83.1 83.4 85.0  MCH 26.1 25.8* 25.5*  MCHC 31.4 30.9 30.0   RDW 17.2* 17.6* 17.8*  PLT 54* 99* 158    BNPNo results for input(s): BNP, PROBNP in the last 168 hours.   Lactic Acid, Venous    Component Value Date/Time   LATICACIDVEN 1.8 06/19/2019 1503   Magnesium  Date Value Ref Range Status  06/21/2019 1.8 1.7 - 2.4 mg/dL Final    Comment:    Performed at [facility], [place] [address] [place], [place] [address]  06/20/2019 1.7 1.7 - 2.4 mg/dL Final    Comment:    Performed at [facility], [place] [address], [place], [place] [address]  06/19/2019 1.9 1.7 - 2.4 mg/dL Final    Comment:    Performed at [facility], [place] [address], [place], [place] [address]   Phosphorus  Date Value Ref Range Status  06/21/2019 3.0 2.5 - 4.6 mg/dL Final    Comment:    Performed at [facility], [place] [address], [place], [place] [address]      Radiology    [place] EKG SITE RITE  Result Date: 06/19/2019 If Site Rite image not attached, placement could not be confirmed due to current cardiac rhythm.   Cardiac Studies   Echocardiogram 05/14/2019: Impressions: 1. Compared to echo in March 2021, no significant change in LVEF or MR.  Pt is now in atrial fibrillation.  2. LVEF is severely depressed with severe hypokinesis/akinesis of the  base/mid inferior, inferosepal, infer walls and apical walls; hypokinesis  elsewhere . Left ventricular ejection fraction, by estimation, is 30%. The  left ventricle has severely  decreased function. The left ventricular internal cavity size was mildly  dilated. There is mild left ventricular hypertrophy. Left ventricular  diastolic parameters are indeterminate.  3. Right ventricular systolic function is normal. The right ventricular  size is normal. There is normal pulmonary artery systolic pressure.  4. Left atrial size was mildly dilated.  5. MR is directed posteriorly into LA, likely due to tethering of  posterior leaflet. Severe mitral  valve  regurgitation.  6. The aortic valve is tricuspid. Aortic valve regurgitation is not  visualized. Mild aortic valve sclerosis is present, with no evidence of  aortic valve stenosis.  7. The inferior vena cava is normal in size with greater than 50%  respiratory variability, suggesting right atrial pressure of 3 mmHg.   Patient Profile     64 y.o. male with a history of CAD with recent STEMI in 03/2019 s/p DES x2 to RCA and DES to RPDA and then staged PCI with DES to LAD, DES x2 to CX, and DES to OM3; chronic systolic CHF/ ischemic cardiomyopathy with EF of 30% on Echo in 04/2019; severe mitral regurgitation, paroxsymal atrial fibrillation/flutter on Amiodarone and Xarelto; COPD; seizure disorder; and diffuse large B-cell lymphoma. Patient admitted on 06/15/2019 for hypotension and pancytopnenia secondary to chemotherapy. Cardiology consulted on 06/18/2019 for further evaluation of atrial fibrillation with RVR at the request of Dr. Louanne Belton.   Assessment & Plan    Atrial Fibrillation/Flutter with RVR - continues in flutter, rate generally controlled - metoprolol 25 mg q 6 h held this am 2nd low BP, will decrease dose and liberalize parameters - on IV amio - on Xarelto 20 mg qd - will plan DCCV tomorrow  Acute on Chronic Combined CHF - EF still 30% by echo this admit - I/O net +2.6 L since admit, but UOP 1600 cc yesterday w/ Lasix 40 mg x 1 dose - now on Lasix 80 mg, BP dropped this am after dose, follow - need daily wts - continue to follow CVP - continue metoprolol as above - BP will not tolerate ACE/ARB/ARNI - CVP 6 today, will hold PM IV lasix dose and plan to restart PO lasix tomorrow  CAD, s/p STEMI in 03/2019 treated with DES x2 to RCA and DES to RPDA and then staged PCI with DES to LAD, DES x2 to CX, and DES to OM3. - on Brilinta, no ASA 2nd Xarelto - on BB and statin  Severe Mitral Regurgitation - see echo results above, is likely due to tethering of posterior leaflet -  once he recovers, consider Mitraclip eval  Hypotensive - SBP 80s-100s, on midodrine 2.5 mg tid - MAP ok, asymptomatic - discuss w/ MD if increasing midodrine would allow more consistent BB therapy  Electrolyte Abnormalities - K+ nadir 2.3 on 05/29, rec'd IV/PO supplement - now on KCl 40 meq bid - K+ 3.7 today - Mg nadir 1.6, s/p IV supp but trending down, start Mag-ox 400 mg qd - Phos also low, s/p supp and improved  Recently Diagnosed High-Grade Lymphoma With extensive retroperitoneal, pelvic, and inguinal lymphadenopathy. There is also involvement of the spleen and near complete infiltration of the right kidney. 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 Suspicious for infiltrating disease of the right iliac and left pubic bones.  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  Agree with above documentation.  On exam, patient is alert and oriented, irregular, tachycardic , no murmurs, lungs CTAB, 2+ LE edema, + JVD.  Telemetry personally reviewed and shows atrial flutter with rates 90s to 120s, 5 beat run of NSVT.  CVP 10, Co-ox 78%.  Became hypotensive with IV lasix yesterday and CVP down to 6.  BP improved, CVP trending up.  Will start PO lasix 40 mg daily.   Continue amio gtt and metoprolol as BP tolerates. Continue Xarelto. Plan for DCCV tomorrow.  Donato Heinz, MD

## 2019-06-21 NOTE — H&P (View-Only) (Signed)
Progress Note  Patient Name: Tony Saunders Date of Encounter: 06/21/2019  Primary Cardiologist: Glenetta Hew, MD   Subjective   A little light-headed this am, no chest pain or SOB  Inpatient Medications    Scheduled Meds: . allopurinol  300 mg Oral Daily  . calcium carbonate  1 tablet Oral TID  . Chlorhexidine Gluconate Cloth  6 each Topical Daily  . feeding supplement  1 Container Oral Q breakfast  . feeding supplement (ENSURE ENLIVE)  237 mL Oral BID BM  . fluconazole  100 mg Oral Daily  . lamoTRIgine  100 mg Oral BID  . levETIRAcetam  750 mg Oral BID  . magnesium oxide  400 mg Oral Daily  . mouth rinse  15 mL Mouth Rinse BID  . metoprolol tartrate  12.5 mg Oral Q6H  . midodrine  2.5 mg Oral TID WC  . mirtazapine  15 mg Oral QHS  . nystatin  5 mL Oral QID  . potassium chloride  40 mEq Oral BID  . protein supplement  1 packet Oral BID  . rivaroxaban  20 mg Oral Q supper  . rosuvastatin  5 mg Oral QHS  . sertraline  25 mg Oral Daily  . sodium chloride flush  3 mL Intravenous Q12H  . sodium chloride flush  3 mL Intravenous Q12H  . ticagrelor  90 mg Oral BID   Continuous Infusions: . sodium chloride    . amiodarone 30 mg/hr (06/21/19 0631)   PRN Meds: sodium chloride, acetaminophen **OR** acetaminophen, metoprolol tartrate, morphine injection, nitroGLYCERIN, ondansetron **OR** ondansetron (ZOFRAN) IV, pantoprazole, sodium chloride flush, sodium chloride flush   Vital Signs    CVP: 06/01 was 4;  06/02 was 14 at 5 am>>6>>7>>9>>9>>12 at 5 am 06/03  Vitals:   06/21/19 0700 06/21/19 0800 06/21/19 0900 06/21/19 1000  BP: 111/69 98/70 103/71 (!) 142/125  Pulse: (!) 110 (!) 117 (!) 125 (!) 117  Resp: (!) 24 (!) 26 (!) 24 20  Temp:  97.6 F (36.4 C)    TempSrc:  Oral    SpO2: 99% 97% 97% 99%  Weight:      Height:        Intake/Output Summary (Last 24 hours) at 06/21/2019 1022 Last data filed at 06/21/2019 0543 Gross per 24 hour  Intake 328.21 ml  Output  1650 ml  Net -1321.79 ml   Last 3 Weights 06/21/2019 06/18/2019 06/15/2019  Weight (lbs) 178 lb 9.2 oz 182 lb 15.7 oz 170 lb  Weight (kg) 81 kg 83 kg 77.111 kg      Telemetry    Atrial flutter, HR elevated this am - Personally Reviewed  ECG    No new ECG tracing today. - Personally Reviewed  Physical Exam   GEN: No acute distress.   Neck: No JVD Cardiac: RRR, no murmurs, rubs, or gallops.  Respiratory: diminished to auscultation bilaterally with rales in the bases. GI: Soft, nontender, non-distended  MS: No edema; No deformity. Neuro:  Nonfocal  Psych: Normal affect   GEN: No acute distress. Chronically-ill appearing  Neck: Only mild JVD seen, despite elevated CVP Cardiac: Irreg R&R, soft murmur, no rubs, or gallops.  Respiratory: diminished to auscultation bilaterally with rales in the bases. GI: Soft, nontender, non-distended  MS: 2+ LE edema; No deformity. Very weak Neuro:  Nonfocal  Psych: Normal affect   Labs    High Sensitivity Troponin:   Recent Labs  Lab 05/22/19 1603  TROPONINIHS 13      Chemistry  Recent Labs  Lab 06/15/19 1400 06/16/19 0817 06/19/19 0528 06/20/19 0500 06/21/19 0532  NA 137   < > 135 137 137  K 3.1*   < > 4.3 3.7 3.9  CL 103   < > 100 102 101  CO2 24   < > 24 24 25   GLUCOSE 91   < > 123* 107* 155*  BUN 13   < > 17 20 19   CREATININE 0.80   < > 0.98 0.98 1.06  CALCIUM 7.9*   < > 7.7* 7.4* 7.7*  PROT 4.5*  --   --  4.5* 4.7*  ALBUMIN 2.3*  --   --  2.0* 2.0*  AST 18  --   --  24 36  ALT 23  --   --  29 32  ALKPHOS 67  --   --  62 66  BILITOT 0.8  --   --  0.8 0.6  GFRNONAA >60   < > >60 >60 >60  GFRAA >60   < > >60 >60 >60  ANIONGAP 10   < > 11 11 11    < > = values in this interval not displayed.     Hematology Recent Labs  Lab 06/18/19 0330 06/19/19 0528 06/21/19 0532  WBC 1.4* 3.5* 4.0  RBC 3.14* 3.49* 3.53*  HGB 8.2* 9.0* 9.0*  HCT 26.1* 29.1* 30.0*  MCV 83.1 83.4 85.0  MCH 26.1 25.8* 25.5*  MCHC 31.4 30.9 30.0   RDW 17.2* 17.6* 17.8*  PLT 54* 99* 158    BNPNo results for input(s): BNP, PROBNP in the last 168 hours.   Lactic Acid, Venous    Component Value Date/Time   LATICACIDVEN 1.8 06/19/2019 1503   Magnesium  Date Value Ref Range Status  06/21/2019 1.8 1.7 - 2.4 mg/dL Final    Comment:    Performed at [facility], [place] [address] [place], [place] [address]  06/20/2019 1.7 1.7 - 2.4 mg/dL Final    Comment:    Performed at [facility], [place] [address] [place], [place] [address]  06/19/2019 1.9 1.7 - 2.4 mg/dL Final    Comment:    Performed at [facility], [place] [address], [place], [place] [address]   Phosphorus  Date Value Ref Range Status  06/21/2019 3.0 2.5 - 4.6 mg/dL Final    Comment:    Performed at [facility], [place] [address] [place], [place] [address]      Radiology    [place] EKG SITE RITE  Result Date: 06/19/2019 If Site Rite image not attached, placement could not be confirmed due to current cardiac rhythm.   Cardiac Studies   Echocardiogram 05/14/2019: Impressions: 1. Compared to echo in March 2021, no significant change in LVEF or MR.  Pt is now in atrial fibrillation.  2. LVEF is severely depressed with severe hypokinesis/akinesis of the  base/mid inferior, inferosepal, infer walls and apical walls; hypokinesis  elsewhere . Left ventricular ejection fraction, by estimation, is 30%. The  left ventricle has severely  decreased function. The left ventricular internal cavity size was mildly  dilated. There is mild left ventricular hypertrophy. Left ventricular  diastolic parameters are indeterminate.  3. Right ventricular systolic function is normal. The right ventricular  size is normal. There is normal pulmonary artery systolic pressure.  4. Left atrial size was mildly dilated.  5. MR is directed posteriorly into LA, likely due to tethering of  posterior leaflet. Severe mitral  valve  regurgitation.  6. The aortic valve is tricuspid. Aortic valve regurgitation is not  visualized. Mild aortic valve sclerosis is present, with no evidence of  aortic valve stenosis.  7. The inferior vena cava is normal in size with greater than 50%  respiratory variability, suggesting right atrial pressure of 3 mmHg.   Patient Profile     64 y.o. male with a history of CAD with recent STEMI in 03/2019 s/p DES x2 to RCA and DES to RPDA and then staged PCI with DES to LAD, DES x2 to CX, and DES to OM3; chronic systolic CHF/ ischemic cardiomyopathy with EF of 30% on Echo in 04/2019; severe mitral regurgitation, paroxsymal atrial fibrillation/flutter on Amiodarone and Xarelto; COPD; seizure disorder; and diffuse large B-cell lymphoma. Patient admitted on 06/15/2019 for hypotension and pancytopnenia secondary to chemotherapy. Cardiology consulted on 06/18/2019 for further evaluation of atrial fibrillation with RVR at the request of Dr. Louanne Belton.   Assessment & Plan    Atrial Fibrillation/Flutter with RVR - continues in flutter, rate generally controlled - metoprolol 25 mg q 6 h held this am 2nd low BP, will decrease dose and liberalize parameters - on IV amio - on Xarelto 20 mg qd - will plan DCCV tomorrow  Acute on Chronic Combined CHF - EF still 30% by echo this admit - I/O net +2.6 L since admit, but UOP 1600 cc yesterday w/ Lasix 40 mg x 1 dose - now on Lasix 80 mg, BP dropped this am after dose, follow - need daily wts - continue to follow CVP - continue metoprolol as above - BP will not tolerate ACE/ARB/ARNI - CVP 6 today, will hold PM IV lasix dose and plan to restart PO lasix tomorrow  CAD, s/p STEMI in 03/2019 treated with DES x2 to RCA and DES to RPDA and then staged PCI with DES to LAD, DES x2 to CX, and DES to OM3. - on Brilinta, no ASA 2nd Xarelto - on BB and statin  Severe Mitral Regurgitation - see echo results above, is likely due to tethering of posterior leaflet -  once he recovers, consider Mitraclip eval  Hypotensive - SBP 80s-100s, on midodrine 2.5 mg tid - MAP ok, asymptomatic - discuss w/ MD if increasing midodrine would allow more consistent BB therapy  Electrolyte Abnormalities - K+ nadir 2.3 on 05/29, rec'd IV/PO supplement - now on KCl 40 meq bid - K+ 3.7 today - Mg nadir 1.6, s/p IV supp but trending down, start Mag-ox 400 mg qd - Phos also low, s/p supp and improved  Recently Diagnosed High-Grade Lymphoma With extensive retroperitoneal, pelvic, and inguinal lymphadenopathy. There is also involvement of the spleen and near complete infiltration of the right kidney. 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 Suspicious for infiltrating disease of the right iliac and left pubic bones.  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  Agree with above documentation.  On exam, patient is alert and oriented, irregular, tachycardic , no murmurs, lungs CTAB, 2+ LE edema, + JVD.  Telemetry personally reviewed and shows atrial flutter with rates 90s to 120s, 5 beat run of NSVT.  CVP 10, Co-ox 78%.  Became hypotensive with IV lasix yesterday and CVP down to 6.  BP improved, CVP trending up.  Will start PO lasix 40 mg daily.   Continue amio gtt and metoprolol as BP tolerates. Continue Xarelto. Plan for DCCV tomorrow.  Donato Heinz, MD

## 2019-06-21 NOTE — Progress Notes (Signed)
Nutrition Follow-up  DOCUMENTATION CODES:   Severe malnutrition in context of chronic illness  INTERVENTION:  - continue Boost Breeze once/day and Ensure Enlive BID. - will order Magic Cup BID with meals, each supplement provides 290 kcal and 9 grams of protein. - will d/c Unjury chicken soup as patient is refusing this supplement.  NUTRITION DIAGNOSIS:   Severe Malnutrition related to chronic illness, cancer and cancer related treatments as evidenced by moderate fat depletion, moderate muscle depletion, severe muscle depletion, edema. -revised  GOAL:   Patient will meet greater than or equal to 90% of their needs -unmet  MONITOR:   PO intake, Supplement acceptance, Labs, Weight trends, Skin  ASSESSMENT:   64 year old male referred for admission from outpatient clinic for hypotension and pancytopenia. Patient reports episodes of diarrhea, poor appetite, and weight loss since starting chemotherapy on 5/19 for newly diagnosed B-cell lymphoma. 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  Patient was assessed remotely by another RD on 5/29; the only documented intake since that time is 25% of breakfast yesterday. Plan was for cardioversion today but has now been changed to tomorrow (6/4). Patient ate a full cup of oatmeal for breakfast this AM without issue. He reports that appetite remains decreased/poor. Daughter is at bedside and states she and staff continue to provide encouragement to eat and that this has been helpful.   Weight has been fluctuating since admission on 5/28. Noted very deep pitting edema to BLE.   Patient is noted to have oropharyngeal candidiasis 2/2 chemo. Previous dx of colonic ileus is improved.    Labs reviewed; Ca: 7.7 mg/dl. Medications reviewed; 100 mg diflucan/day, 5 ml mycostatin QID, 15 mg remeron at bedtime, 40  mEq Klor-Con BID,     NUTRITION - FOCUSED PHYSICAL EXAM:    Most Recent Value  Orbital Region  Moderate depletion  Upper Arm Region  Severe depletion  Thoracic and Lumbar Region  Unable to assess  Buccal Region  Moderate depletion  Temple Region  Severe depletion  Clavicle Bone Region  Severe depletion  Clavicle and Acromion Bone Region  Severe depletion  Scapular Bone Region  Unable to assess  Dorsal Hand  Mild depletion  Patellar Region  Mild depletion  Anterior Thigh Region  Mild depletion  Posterior Calf Region  Mild depletion  Edema (RD Assessment)  Severe [very deep pitting edema to BLE.]  Hair  Reviewed  Eyes  Reviewed  Mouth  Reviewed  Skin  Reviewed  Nails  Reviewed       Diet Order:   Diet Order            Diet Heart Room service appropriate? Yes; Fluid consistency: Thin  Diet effective now              EDUCATION NEEDS:   No education needs have been identified at this time  Skin:  Skin Assessment: Skin Integrity Issues: Skin Integrity Issues:: Stage I Stage I: sacrum  Last BM:  6/2  Height:   Ht Readings from Last 1 Encounters:  06/18/19 6' (1.829 m)    Weight:   Wt Readings from Last 1 Encounters:  06/21/19 81 kg    Estimated Nutritional Needs:  Kcal:  [id number] Protein:  120-135 Fluid:  >/= 2.3 L/day     Jarome Matin, MS, RD, LDN, CNSC Inpatient Clinical Dietitian RD pager # available in [facility]  After hours/weekend pager # available in [facility]

## 2019-06-21 NOTE — Progress Notes (Signed)
PROGRESS NOTE  Tony Saunders [id number] DOB: 08/23/1955 DOA: 06/15/2019 PCP: [name], [name], MD   LOS: 6 days   Brief narrative: As per HPI and previous provider,  64 year old male with diffuse B-cell lymphomaMay2021, started chemotherapy 5/19, presented for an outpatient port flushandradiation radiation simulation, found to be hypotensive and pancytopenic and was admitted to hospital for further evaluation and treatment.  During hospitalization, patient developed the atrial flutter with rapid ventricular response and cardiology was consulted patient was then transferred to the stepdown unit for closer monitoring and treatment.  He was started on amiodarone drip and plan was made for possible cardioversion.  Patient was also initiated on radiation treatment for B-cell lymphoma.  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  Continue on amiodarone drip and metoprolol.  Still in RVR despite drip.  Patient will likely need cardioversion.  Awaiting.  Currently NPO.  On Brilinta and Xarelto.  Pancytopenia secondary to chemotherapy, symptomatic anemia.   Status post 3 units of packed RBC.  Hemoglobin at 9.3.  WBC has normalized at 4.0.  Platelets at 150.  On Brilinta and Xarelto.  No evidence of active bleeding but mild petechiae and bruise on the leg.  Will need to closely monitor.  Oropharyngeal  candidiasis secondary to chemotherapy On Diflucan, nystatin.  Currently on regular consistency diet.  Hypokalemia, hypophosphatemia Improved with replacement.  Will politically monitor.  Colonic ileus Improved..   Diffuse B-cell lymphoma Patient will need continued outpatient follow-up.  Patient was started on R-CHOP on Jun 06, 2019.  Plan for outpatient chemotherapy after discharge.  Borderline hypotension --On midodrine.  Likely secondary to systolic cardiomyopathy.   On decreased dose of metoprolol  Seizure disorder  Continue Lamictal and Keppra  VTE Prophylaxis: Xarelto  Code Status: Full code  Family Communication: None today..  Status is: Inpatient  Remains inpatient appropriate because: cardiology evaluation and further treatment including need for further cardio version, on IV antiarrhythmics, stepdown unit care, patient with multiple comorbidities including lymphoma needing radiation.   Dispo: The patient is from: Home              Anticipated d/c is to: Home with home health              Anticipated d/c date is: 2 to 3 days.              Patient currently is not medically stable to d/c.   Consultants:  Oncology  Cardiology  Procedures:  PRBC transfusion  Antibiotics/antifungals:   Fluconazole  Anti-infectives (From admission, onward)   Start     Dose/Rate Route Frequency Ordered Stop   06/17/19 1000  fluconazole (DIFLUCAN) tablet 100 mg     100 mg Oral Daily 06/16/19 0915     06/16/19 1000  fluconazole (DIFLUCAN) IVPB 200 mg     200 mg 100 mL/hr over 60 Minutes Intravenous  Once 06/16/19 0912 06/16/19 1121     Subjective: Today, patient was seen and examined at  bedside.  Denies any chest pain, shortness of breath, fever, chills has mild cough..    Objective: Vitals:   06/21/19 0900 06/21/19 1000  BP: 103/71 (!) 142/125  Pulse: (!) 125 (!) 117  Resp: (!) 24 20  Temp:    SpO2: 97% 99%    Intake/Output Summary (Last 24 hours) at  06/21/2019 1029 Last data filed at 06/21/2019 0543 Gross per 24 hour  Intake 328.21 ml  Output 1650 ml  Net -1321.79 ml   Filed Weights   06/15/19 1559 06/18/19 1700 06/21/19 0500  Weight: 77.1 kg 83 kg 81 kg   Body mass index is 24.22 kg/m.   Physical Exam:  General: Thinly built, alert awake oriented, chronically ill, not in obvious distress HENT: Mild pallor noted. Oral mucosa is moist.  Chest:  Clear breath sounds.  Diminished breath sounds bilaterally.  CVS: S1 &S2 heard.  Systolic murmur noted, irregular rhythm. Abdomen: Soft, mild nonspecific tenderness of the lower abdomen.  Bowel sounds are heard.   Extremities: No cyanosis, clubbing but with bilateral lower extremity pitting pedal edema.  Peripheral pulses are palpable. Psych: Alert, awake and oriented, normal mood CNS:  No cranial nerve deficits.  Power equal in all extremities.   Skin: Warm and dry.  Right dorsal foot with bruising . petechiae  Data Review: I have personally reviewed the following laboratory data and studies,  CBC: Recent Labs  Lab 06/15/19 1400 06/15/19 1400 06/16/19 0817 06/17/19 0300 06/18/19 0330 06/19/19 0528 06/21/19 0532  WBC 0.1*  --  0.2* 0.4* 1.4* 3.5* 4.0  NEUTROABS 0.0*  --  0.0* 0.3* 1.0* 2.8  --   HGB 6.9*  --  8.1* 8.2* 8.2* 9.0* 9.0*  HCT 22.0*   < > 25.3* 25.8* 26.1* 29.1* 30.0*  MCV 80.9   < > 80.8 82.2 83.1 83.4 85.0  PLT 44*  --  36* 39* 54* 99* 158   < > = values in this interval not displayed.   Basic Metabolic Panel: Recent Labs  Lab 06/17/19 0300 06/17/19 0300 06/17/19 1706 06/18/19 0330 06/19/19 0528 06/20/19 0500 06/21/19 0532  NA 135   < > 136 134* 135 137 137  K 3.0*   < > 2.8* 3.4* 4.3 3.7 3.9  CL 103   < > 102 102 100 102 101  CO2 24   < > 24 24 24 24 25   GLUCOSE 79   < > 110* 97 123* 107* 155*  BUN 15   < > 15 14 17 20 19   CREATININE 0.88   < > 0.89 0.91 0.98 0.98 1.06  CALCIUM 7.3*   < > 7.2* 7.4* 7.7* 7.4* 7.7*  MG 1.9  --   --  2.0 1.9 1.7 1.8    PHOS 2.1*  --   --  1.8* 3.6 3.6 3.0   < > = values in this interval not displayed.   Liver Function Tests: Recent Labs  Lab 06/15/19 1400 06/20/19 0500 06/21/19 0532  AST 18 24 36  ALT 23 29 32  ALKPHOS 67 62 66  BILITOT 0.8 0.8 0.6  PROT 4.5* 4.5* 4.7*  ALBUMIN 2.3* 2.0* 2.0*   No results for input(s): LIPASE, AMYLASE in the last 168 hours. No results for input(s): AMMONIA in the last 168 hours. Cardiac Enzymes: No results for input(s): CKTOTAL, CKMB, CKMBINDEX, TROPONINI in the last 168 hours. BNP (last 3 results) No results for input(s): BNP in the last 8760 hours.  ProBNP (last 3 results) No results  for input(s): PROBNP in the last 8760 hours.  CBG: No results for input(s): GLUCAP in the last 168 hours. Recent Results (from the past 240 hour(s))  SARS Coronavirus 2 by RT PCR (hospital order, performed in [facility] hospital lab) Nasopharyngeal Nasopharyngeal Swab     Status: None   Collection Time: 06/15/19  4:47 PM   Specimen: Nasopharyngeal Swab  Result Value Ref Range Status   SARS Coronavirus 2 NEGATIVE NEGATIVE Final    Comment: (NOTE) SARS-CoV-2 target nucleic acids are NOT DETECTED. The SARS-CoV-2 RNA is generally detectable in upper and lower respiratory specimens during the acute phase of infection. The lowest concentration of SARS-CoV-2 viral copies this assay can detect is 250 copies / mL. A negative result does not preclude SARS-CoV-2 infection and should not be used as the sole basis for treatment or other patient management decisions.  A negative result may occur with improper specimen collection / handling, submission of specimen other than nasopharyngeal swab, presence of viral mutation(s) within the areas targeted by this assay, and inadequate number of viral copies (<250 copies / mL). A negative result must be combined with clinical observations, patient history, and epidemiological information. Fact Sheet for Patients:    StrictlyIdeas.no Fact Sheet for Healthcare Providers: BankingDealers.co.za This test is not yet approved or cleared  by the [place] FDA and has been authorized for detection and/or diagnosis of SARS-CoV-2 by FDA under an Emergency Use Authorization (EUA).  This EUA will remain in effect (meaning this test can be used) for the duration of the COVID-19 declaration under Section 564(b)(1) of the Act, 21 U.S.C. section 360bbb-3(b)(1), unless the authorization is terminated or revoked sooner. Performed at [facility], [place] [address], [place], [place] [address]   MRSA PCR Screening     Status: None   Collection Time: 06/18/19  5:43 PM   Specimen: Nasal Mucosa; Nasopharyngeal  Result Value Ref Range Status   MRSA by PCR NEGATIVE NEGATIVE Final    Comment:        The GeneXpert MRSA Assay (FDA approved for NASAL specimens only), is one component of a comprehensive MRSA colonization surveillance program. It is not intended to diagnose MRSA infection nor to guide or monitor treatment for MRSA infections. Performed at [facility], [place] [address], [place], [place] [address]      Studies: [place] EKG SITE RITE  Result Date: 06/19/2019 If Site Rite image not attached, placement could not be confirmed due to current cardiac rhythm.     Flora Lipps, MD  [facility] 06/21/2019

## 2019-06-22 ENCOUNTER — Encounter (HOSPITAL_COMMUNITY)
Admission: EM | Disposition: A | Discharge status: Home / Self Care | Payer: Self-pay | Source: Home / Self Care | Attending: Internal Medicine

## 2019-06-22 ENCOUNTER — Other Ambulatory Visit: Payer: Self-pay

## 2019-06-22 ENCOUNTER — Ambulatory Visit
Admission: RE | Admit: 2019-06-22 | Discharge: 2019-06-22 | Disposition: A | Discharge status: Home / Self Care | Payer: No Typology Code available for payment source | Source: Ambulatory Visit | Attending: Radiation Oncology | Admitting: Radiation Oncology

## 2019-06-22 ENCOUNTER — Encounter (HOSPITAL_COMMUNITY): Payer: Self-pay | Admitting: Family Medicine

## 2019-06-22 ENCOUNTER — Inpatient Hospital Stay (HOSPITAL_COMMUNITY): Payer: No Typology Code available for payment source | Admitting: Anesthesiology

## 2019-06-22 HISTORY — PX: CARDIOVERSION: SHX1299

## 2019-06-22 LAB — COMPREHENSIVE METABOLIC PANEL
ALT: 35 U/L (ref 0–44)
AST: 40 U/L (ref 15–41)
Albumin: 1.9 g/dL — ABNORMAL LOW (ref 3.5–5.0)
Alkaline Phosphatase: 61 U/L (ref 38–126)
Anion gap: 9 (ref 5–15)
BUN: 19 mg/dL (ref 8–23)
CO2: 26 mmol/L (ref 22–32)
Calcium: 7.4 mg/dL — ABNORMAL LOW (ref 8.9–10.3)
Chloride: 102 mmol/L (ref 98–111)
Creatinine, Ser: 1.06 mg/dL (ref 0.61–1.24)
GFR calc Af Amer: >60 mL/min (ref >60)
GFR calc non Af Amer: >60 mL/min (ref >60)
Glucose, Bld: 131 mg/dL — ABNORMAL HIGH (ref 70–99)
Potassium: 3.4 mmol/L — ABNORMAL LOW (ref 3.5–5.1)
Sodium: 137 mmol/L (ref 135–145)
Total Bilirubin: 0.4 mg/dL (ref 0.3–1.2)
Total Protein: 4.4 g/dL — ABNORMAL LOW (ref 6.5–8.1)

## 2019-06-22 LAB — CBC
HCT: 25.7 % — ABNORMAL LOW (ref 39.0–52.0)
Hemoglobin: 7.9 g/dL — ABNORMAL LOW (ref 13.0–17.0)
MCH: 26.2 pg (ref 26.0–34.0)
MCHC: 30.7 g/dL (ref 30.0–36.0)
MCV: 85.1 fL (ref 80.0–100.0)
Platelets: 167 10*3/uL (ref 150–400)
RBC: 3.02 MIL/uL — ABNORMAL LOW (ref 4.22–5.81)
RDW: 17.9 % — ABNORMAL HIGH (ref 11.5–15.5)
WBC: 4.1 10*3/uL (ref 4.0–10.5)
nRBC: 0 % (ref 0.0–0.2)

## 2019-06-22 LAB — PHOSPHORUS: Phosphorus: 2.4 mg/dL — ABNORMAL LOW (ref 2.5–4.6)

## 2019-06-22 LAB — MAGNESIUM: Magnesium: 1.7 mg/dL (ref 1.7–2.4)

## 2019-06-22 SURGERY — CARDIOVERSION
Anesthesia: General

## 2019-06-22 MED ORDER — AMIODARONE HCL 200 MG PO TABS
200.0000 mg | ORAL_TABLET | Freq: Every day | ORAL | Status: DC
  Administered 2019-06-22 – 2019-06-23 (×2): 200 mg via ORAL
  Filled 2019-06-22: qty 1

## 2019-06-22 MED ORDER — CHLORHEXIDINE GLUCONATE CLOTH 2 % EX PADS
6.0000 | MEDICATED_PAD | Freq: Every day | CUTANEOUS | Status: DC
  Administered 2019-06-23 – 2019-06-30 (×8): 6 via TOPICAL

## 2019-06-22 MED ORDER — PROPOFOL 10 MG/ML IV BOLUS
INTRAVENOUS | Status: DC | PRN
  Administered 2019-06-22: 60 mg via INTRAVENOUS

## 2019-06-22 MED ORDER — LIDOCAINE 2% (20 MG/ML) 5 ML SYRINGE
INTRAMUSCULAR | Status: DC | PRN
  Administered 2019-06-22: 80 mg via INTRAVENOUS

## 2019-06-22 MED ORDER — METOPROLOL TARTRATE 12.5 MG HALF TABLET
12.5000 mg | ORAL_TABLET | Freq: Two times a day (BID) | ORAL | Status: DC
  Administered 2019-06-22 – 2019-06-23 (×2): 12.5 mg via ORAL
  Filled 2019-06-22 (×2): qty 1

## 2019-06-22 MED ORDER — K PHOS MONO-SOD PHOS DI & MONO 155-852-130 MG PO TABS
250.0000 mg | ORAL_TABLET | Freq: Three times a day (TID) | ORAL | Status: DC
  Administered 2019-06-22 – 2019-06-24 (×6): 250 mg via ORAL
  Filled 2019-06-22 (×8): qty 1

## 2019-06-22 MED ORDER — SODIUM CHLORIDE 0.9 % IV SOLN: INTRAVENOUS | Status: DC

## 2019-06-22 NOTE — Anesthesia Postprocedure Evaluation (Signed)
Anesthesia Post Note  Patient: Tony Saunders  Procedure(s) Performed: CARDIOVERSION (N/A )     Patient location during evaluation: Endoscopy Anesthesia Type: General Level of consciousness: awake and alert Pain management: pain level controlled Vital Signs Assessment: post-procedure vital signs reviewed and stable Respiratory status: spontaneous breathing, nonlabored ventilation and respiratory function stable Cardiovascular status: blood pressure returned to baseline and stable Postop Assessment: no apparent nausea or vomiting Anesthetic complications: no    Last Vitals:  Vitals:   06/22/19 0910 06/22/19 0920  BP: (!) 83/60 (!) 83/59  Pulse: 95 95  Resp: (!) 23 (!) 26  Temp:    SpO2: 91% 100%    Last Pain:  Vitals:   06/22/19 0920  TempSrc:   PainSc: 0-No pain                 Lynda Rainwater

## 2019-06-22 NOTE — Progress Notes (Signed)
Patient converted back to atrial flutter around 2pm this afternoon.  Rates appear controlled, in 90-100s.  Will continue metoprolol and amiodarone.  If having further RVR, would likely start digoxin given his soft blood pressures.  Donato Heinz, MD

## 2019-06-22 NOTE — Interval H&P Note (Signed)
History and Physical Interval Note:  06/22/2019 8:30 AM  Tony Saunders  has presented today for surgery, with the diagnosis of Afib/ aflutter.  The various methods of treatment have been discussed with the patient and family. After consideration of risks, benefits and other options for treatment, the patient has consented to  Procedure(s): CARDIOVERSION (N/A) as a surgical intervention.  The patient's history has been reviewed, patient examined, no change in status, stable for surgery.  I have reviewed the patient's chart and labs.  Questions were answered to the patient's satisfaction.     [name]

## 2019-06-22 NOTE — Interval H&P Note (Signed)
History and Physical Interval Note:  06/22/2019 8:24 AM  Tony Saunders  has presented today for surgery, with the diagnosis of Afib/ aflutter.  The various methods of treatment have been discussed with the patient and family. After consideration of risks, benefits and other options for treatment, the patient has consented to  Procedure(s): CARDIOVERSION (N/A) as a surgical intervention.  The patient's history has been reviewed, patient examined, no change in status, stable for surgery.  I have reviewed the patient's chart and labs.  Questions were answered to the patient's satisfaction.     [name]

## 2019-06-22 NOTE — Progress Notes (Signed)
Patient A+Ox4, VSS, [facility] present to transfer patient to [facility] for Cardioversion, report given to transport RN, Amiodarone gtt infusing. Assessment in flowsheets.

## 2019-06-22 NOTE — Progress Notes (Signed)
PROGRESS NOTE  Tony Saunders IOX:[id number] DOB: 07/23/1955 DOA: 06/15/2019 PCP: [name], [name], MD   LOS: 7 days   Brief narrative: As per HPI and previous provider,  64 year old male with diffuse B-cell lymphomaMay2021, started chemotherapy 5/19, presented for an outpatient port flushandradiation radiation simulation, found to be hypotensive and pancytopenic and was admitted to hospital for further evaluation and treatment.  During hospitalization, patient developed the atrial flutter with rapid ventricular response and cardiology was consulted patient was then transferred to the stepdown unit for closer monitoring and treatment.  He was started on amiodarone drip and underwent cardioversion.  Patient was also initiated on radiation treatment for B-cell lymphoma.  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  On po lasix.  Continue on amiodarone, this has been changed to p.o. after cardioversion..  . Continue  Brilinta and Xarelto.  Pancytopenia secondary to chemotherapy, symptomatic anemia.   Status post 3 units of packed RBC.  Hemoglobin at 7.9.  WBC has normalized at 4.1.  Platelets at 167.  On Brilinta and Xarelto.  No evidence of  active bleeding but mild petechiae and bruise on the leg.  Will need to closely monitor.  If downtrending hemoglobin might need blood transfusion.  Oropharyngeal candidiasis secondary to chemotherapy On Diflucan, nystatin.  Currently on regular consistency diet.  Hypokalemia, hypophosphatemia Improved with replacement.  Will closely monitor. Continue phosphate replacement. Continue potassium.  Colonic ileus  Improved.  Tolerating oral diet.  Diffuse B-cell lymphoma Patient will need continued outpatient follow-up.  Patient was started on R-CHOP on Jun 06, 2019.  Currently undergoing radiation treatment during hospitalization.  Hypotension --On midodrine.  Likely secondary to systolic cardiomyopathy.  On decreased dose of metoprolol IV  Seizure disorder  Continue Lamictal and Keppra  VTE Prophylaxis: Xarelto  Code Status: Full code  Family Communication: Spoke with the patient's daughter at bedside and the wife on the phone and updated them about the clinical condition of the patient.  Status is: Inpatient  Remains inpatient appropriate because: cardiology evaluation and further treatment status post cardio version, stepdown unit care, patient with multiple comorbidities including lymphoma needing radiation.   Dispo: The patient is from: Home              Anticipated d/c is to: Home with home health              Anticipated d/c date is: 2 to 3 days.              Patient currently is not medically stable to d/c.  Consultants:  Oncology  Cardiology  Procedures:  PRBC transfusion  DC cardioversion on 06/22/2019  Antibiotics/antifungals:  . Fluconazole  Anti-infectives (From admission, onward)   Start     Dose/Rate Route Frequency Ordered Stop   06/17/19 1000  fluconazole (DIFLUCAN) tablet 100 mg     100  mg Oral Daily 06/16/19 0915     06/16/19 1000  fluconazole (DIFLUCAN) IVPB 200 mg     200 mg 100 mL/hr over 60 Minutes Intravenous  Once 06/16/19 0912 06/16/19  1121     Subjective: Today, patient was seen and examined at bedside.  Denies any chest pain, shortness of breath, fever or chills.  Feels fatigued.  Seen after cardioversion.  Objective: Vitals:   06/22/19 0426 06/22/19 0500  BP: (!) 82/57 (!) 74/55  Pulse: 87 92  Resp:  16  Temp:    SpO2:  99%    Intake/Output Summary (Last 24 hours) at 06/22/2019 0713 Last data filed at 06/22/2019 0522 Gross per 24 hour  Intake 497.73 ml  Output 850 ml  Net -352.27 ml   Filed Weights   06/18/19 1700 06/21/19 0500 06/22/19 0500  Weight: 83 kg 81 kg 81 kg   Body mass index is 24.22 kg/m.   Physical Exam: General: Thinly built, alert awake oriented, chronically ill, not in obvious distress HENT: Mild pallor noted. Oral mucosa is moist.  Chest:  Clear breath sounds.  Diminished breath sounds bilaterally.  CVS: S1 &S2 heard.  Systolic murmur noted, irregular rhythm. Abdomen: Soft, mild nonspecific tenderness of the lower abdomen.  Bowel sounds are heard.   Extremities: No cyanosis, clubbing but with bilateral lower extremity pitting pedal edema.  Peripheral pulses are palpable. Psych: Alert, awake and oriented, normal mood CNS:  No cranial nerve deficits.  Power equal in all extremities.   Skin: Warm and dry.  Right dorsal foot with bruising . petechiae  Data Review: I have personally reviewed the following laboratory data and studies,  CBC: Recent Labs  Lab 06/15/19 1400 06/15/19 1400 06/16/19 0817 06/16/19 0817 06/17/19 0300 06/18/19 0330 06/19/19 0528 06/21/19 0532 06/22/19 0500  WBC 0.1*  --  0.2*   < > 0.4* 1.4* 3.5* 4.0 4.1  NEUTROABS 0.0*  --  0.0*  --  0.3* 1.0* 2.8  --   --   HGB 6.9*  --  8.1*   < > 8.2* 8.2* 9.0* 9.0* 7.9*  HCT 22.0*   < > 25.3*   < > 25.8* 26.1* 29.1* 30.0* 25.7*  MCV 80.9   < > 80.8   < > 82.2 83.1 83.4 85.0 85.1  PLT 44*  --  36*   < > 39* 54* 99* 158 167   < > = values in this interval not displayed.   Basic Metabolic Panel: Recent Labs  Lab  06/18/19 0330 06/19/19 0528 06/20/19 0500 06/21/19 0532 06/22/19 0500  NA 134* 135 137 137 137  K 3.4* 4.3 3.7 3.9 3.4*  CL 102 100 102 101 102  CO2 24 24 24 25 26   GLUCOSE 97 123* 107* 155* 131*  BUN 14 17 20 19 19   CREATININE 0.91 0.98 0.98 1.06 1.06  CALCIUM 7.4* 7.7* 7.4* 7.7* 7.4*  MG 2.0 1.9 1.7 1.8 1.7  PHOS 1.8* 3.6 3.6 3.0 2.4*   Liver Function Tests: Recent Labs  Lab 06/15/19 1400 06/20/19 0500 06/21/19 0532 06/22/19 0500  AST 18 24 36 40  ALT 23 29 32 35  ALKPHOS 67 62 66 61  BILITOT 0.8 0.8 0.6 0.4  PROT 4.5* 4.5* 4.7* 4.4*  ALBUMIN 2.3* 2.0* 2.0* 1.9*   No results for input(s): LIPASE, AMYLASE in the last 168 hours. No results for input(s): AMMONIA in the last 168 hours. Cardiac Enzymes: No results for input(s): CKTOTAL, CKMB, CKMBINDEX, TROPONINI in the last 168  hours. BNP (last 3 results) No results for input(s): BNP in the last 8760 hours.  ProBNP (last 3 results) No results for input(s): PROBNP in the last 8760 hours.  CBG: No results for input(s): GLUCAP in the last 168 hours. Recent Results (from the past 240 hour(s))  SARS Coronavirus 2 by RT PCR (hospital order, performed in [facility] hospital lab) Nasopharyngeal Nasopharyngeal Swab     Status: None   Collection Time: 06/15/19  4:47 PM   Specimen: Nasopharyngeal Swab  Result Value Ref Range Status   SARS Coronavirus 2 NEGATIVE NEGATIVE Final    Comment: (NOTE) SARS-CoV-2 target nucleic acids are NOT DETECTED. The SARS-CoV-2 RNA is generally detectable in upper and lower respiratory specimens during the acute phase of infection. The lowest concentration of SARS-CoV-2 viral copies this assay can detect is 250 copies / mL. A negative result does not preclude SARS-CoV-2 infection and should not be used as the sole basis for treatment or other patient management decisions.  A negative result may occur with improper specimen collection / handling, submission of specimen other than  nasopharyngeal swab, presence of viral mutation(s) within the areas targeted by this assay, and inadequate number of viral copies (<250 copies / mL). A negative result must be combined with clinical observations, patient history, and epidemiological information. Fact Sheet for Patients:   StrictlyIdeas.no Fact Sheet for Healthcare Providers: BankingDealers.co.za This test is not yet approved or cleared  by the [place] FDA and has been authorized for detection and/or diagnosis of SARS-CoV-2 by FDA under an Emergency Use Authorization (EUA).  This EUA will remain in effect (meaning this test can be used) for the duration of the COVID-19 declaration under Section 564(b)(1) of the Act, 21 U.S.C. section 360bbb-3(b)(1), unless the authorization is terminated or revoked sooner. Performed at [facility], [place] [address], [place], [place] [address]   MRSA PCR Screening     Status: None   Collection Time: 06/18/19  5:43 PM   Specimen: Nasal Mucosa; Nasopharyngeal  Result Value Ref Range Status   MRSA by PCR NEGATIVE NEGATIVE Final    Comment:        The GeneXpert MRSA Assay (FDA approved for NASAL specimens only), is one component of a comprehensive MRSA colonization surveillance program. It is not intended to diagnose MRSA infection nor to guide or monitor treatment for MRSA infections. Performed at [facility], [place] [address] [place], [place] [address]      Studies: No results found.    Flora Lipps, MD  [facility] 06/22/2019

## 2019-06-22 NOTE — Anesthesia Preprocedure Evaluation (Signed)
Anesthesia Evaluation  Patient identified by MRN, date of birth, ID band Patient awake    Reviewed: Allergy & Precautions, NPO status , Patient's Chart, lab work & pertinent test results  Airway Mallampati: II  TM Distance: >3 FB Neck ROM: Full    Dental no notable dental hx.    Pulmonary neg pulmonary ROS, former smoker,    Pulmonary exam normal breath sounds clear to auscultation       Cardiovascular + CAD, + Past MI and +CHF  Normal cardiovascular exam+ dysrhythmias Atrial Fibrillation  Rhythm:Regular Rate:Normal     Neuro/Psych Seizures -,  negative psych ROS   GI/Hepatic negative GI ROS, Neg liver ROS,   Endo/Other  negative endocrine ROS  Renal/GU negative Renal ROS  negative genitourinary   Musculoskeletal negative musculoskeletal ROS (+)   Abdominal   Peds negative pediatric ROS (+)  Hematology negative hematology ROS (+)   Anesthesia Other Findings   Reproductive/Obstetrics negative OB ROS                             Anesthesia Physical Anesthesia Plan  ASA: III  Anesthesia Plan: General   Post-op Pain Management:    Induction: Intravenous  PONV Risk Score and Plan: 2 and Treatment may vary due to age or medical condition  Airway Management Planned: Mask  Additional Equipment:   Intra-op Plan:   Post-operative Plan:   Informed Consent: I have reviewed the patients History and Physical, chart, labs and discussed the procedure including the risks, benefits and alternatives for the proposed anesthesia with the patient or authorized representative who has indicated his/her understanding and acceptance.     Dental advisory given  Plan Discussed with: CRNA  Anesthesia Plan Comments:         Anesthesia Quick Evaluation

## 2019-06-22 NOTE — CV Procedure (Addendum)
   Electrical Cardioversion Procedure Note Tony Saunders [id number] [birth year]  Procedure: Electrical Cardioversion Indications:  Atrial Flutter  Time Out: Verified patient identification, verified procedure,medications/allergies/relevent history reviewed, required imaging and test results available.  Performed  Procedure Details  The patient was NPO after midnight. Anesthesia was administered at the beside  by Dr.Miller with 60mg  of propofol and 80mg  of Lidocaine.  Cardioversion was done with synchronized biphasic defibrillation with AP pads with 150watts.  The patient converted to normal sinus rhythm. The patient tolerated the procedure well   IMPRESSION:  Successful cardioversion of atrial flutter    Tony Saunders 06/22/2019, 8:33 AM

## 2019-06-22 NOTE — Progress Notes (Signed)
EKG CRITICAL VALUE     12 lead EKG performed.  Critical value noted. Benay Pillow, RN notified.   Warren Lacy, CCT 06/22/2019 9:11 AM

## 2019-06-22 NOTE — Transfer of Care (Signed)
Immediate Anesthesia Transfer of Care Note  Patient: Tony Saunders  Procedure(s) Performed: CARDIOVERSION (N/A )  Patient Location: Endoscopy Unit  Anesthesia Type:MAC  Level of Consciousness: drowsy  Airway & Oxygen Therapy: Patient Spontanous Breathing  Post-op Assessment: Report given to RN and Post -op Vital signs reviewed and stable  Post vital signs: Reviewed and stable  Last Vitals:  Vitals Value Taken Time  BP 85/67   Temp    Pulse 94   Resp 16   SpO2 98     Last Pain:  Vitals:   06/22/19 0823  TempSrc: Oral  PainSc: 0-No pain      Patients Stated Pain Goal: 3 (72/07/21 8288)  Complications: No apparent anesthesia complications

## 2019-06-22 NOTE — Progress Notes (Signed)
Progress Note  Patient Name: Tony Saunders Date of Encounter: 06/22/2019  Primary Cardiologist: Glenetta Hew, MD   Subjective   Underwent successful cardioversion to sinus rhythm this morning.  Denies any dyspnea or chest pain.  Inpatient Medications    Scheduled Meds: . allopurinol  300 mg Oral Daily  . calcium carbonate  1 tablet Oral TID  . Chlorhexidine Gluconate Cloth  6 each Topical Daily  . feeding supplement  1 Container Oral Q breakfast  . feeding supplement (ENSURE ENLIVE)  237 mL Oral BID BM  . fluconazole  100 mg Oral Daily  . furosemide  40 mg Oral Daily  . lamoTRIgine  100 mg Oral BID  . levETIRAcetam  750 mg Oral BID  . magnesium oxide  400 mg Oral Daily  . mouth rinse  15 mL Mouth Rinse BID  . metoprolol tartrate  12.5 mg Oral Q6H  . midodrine  2.5 mg Oral TID WC  . mirtazapine  15 mg Oral QHS  . nystatin  5 mL Oral QID  . potassium chloride  40 mEq Oral BID  . rivaroxaban  20 mg Oral Q supper  . rosuvastatin  5 mg Oral QHS  . sertraline  25 mg Oral Daily  . sodium chloride flush  3 mL Intravenous Q12H  . sodium chloride flush  3 mL Intravenous Q12H  . ticagrelor  90 mg Oral BID   Continuous Infusions: . sodium chloride    . amiodarone 30 mg/hr (06/22/19 0800)   PRN Meds: sodium chloride, acetaminophen **OR** acetaminophen, metoprolol tartrate, morphine injection, nitroGLYCERIN, ondansetron **OR** ondansetron (ZOFRAN) IV, pantoprazole, sodium chloride flush, sodium chloride flush   Vital Signs    Vitals:   06/22/19 0920 06/22/19 0930 06/22/19 0940 06/22/19 1100  BP: (!) 83/59 (!) 92/58 (!) 81/62 (!) 88/70  Pulse: 95 96 96 100  Resp: (!) 26 (!) 23 (!) 25 (!) 26  Temp:      TempSrc:      SpO2: 100% 100% 99% 99%  Weight:      Height:        Intake/Output Summary (Last 24 hours) at 06/22/2019 1208 Last data filed at 06/22/2019 0846 Gross per 24 hour  Intake 433.47 ml  Output 1025 ml  Net -591.53 ml   Last 3 Weights 06/22/2019 06/21/2019  06/18/2019  Weight (lbs) 178 lb 9.2 oz 178 lb 9.2 oz 182 lb 15.7 oz  Weight (kg) 81 kg 81 kg 83 kg      Telemetry    Sinus rhythm with frequent PACs, occasional Wenckebach- Personally Reviewed  ECG    No new ECG tracing today. - Personally Reviewed  Physical Exam    GEN: No acute distress. Chronically-ill appearing  Neck: JVD just above clavicle while sitting upright Cardiac: irregular rhythm, 2/6 systolic murmur Respiratory: diminished to auscultation bilaterally GI: Soft, nontender, non-distended  MS: 2+ LE edema; No deformity. Very weak Neuro:  Nonfocal  Psych: Normal affect   Labs    High Sensitivity Troponin:   No results for input(s): TROPONINIHS in the last 720 hours.    Chemistry Recent Labs  Lab 06/20/19 0500 06/21/19 0532 06/22/19 0500  NA 137 137 137  K 3.7 3.9 3.4*  CL 102 101 102  CO2 24 25 26   GLUCOSE 107* 155* 131*  BUN 20 19 19   CREATININE 0.98 1.06 1.06  CALCIUM 7.4* 7.7* 7.4*  PROT 4.5* 4.7* 4.4*  ALBUMIN 2.0* 2.0* 1.9*  AST 24 36 40  ALT 29  32 35  ALKPHOS 62 66 61  BILITOT 0.8 0.6 0.4  GFRNONAA >60 >60 >60  GFRAA >60 >60 >60  ANIONGAP 11 11 9      Hematology Recent Labs  Lab 06/19/19 0528 06/21/19 0532 06/22/19 0500  WBC 3.5* 4.0 4.1  RBC 3.49* 3.53* 3.02*  HGB 9.0* 9.0* 7.9*  HCT 29.1* 30.0* 25.7*  MCV 83.4 85.0 85.1  MCH 25.8* 25.5* 26.2  MCHC 30.9 30.0 30.7  RDW 17.6* 17.8* 17.9*  PLT 99* 158 167    BNPNo results for input(s): BNP, PROBNP in the last 168 hours.   Lactic Acid, Venous    Component Value Date/Time   LATICACIDVEN 1.8 06/19/2019 1503   Magnesium  Date Value Ref Range Status  06/22/2019 1.7 1.7 - 2.4 mg/dL Final    Comment:    Performed at [facility], [place] [address] [place], [place] [address]  06/21/2019 1.8 1.7 - 2.4 mg/dL Final    Comment:    Performed at [facility], [place] [address] [place], [place] [address]  06/20/2019 1.7 1.7 - 2.4 mg/dL Final     Comment:    Performed at [facility], [place] [address] [place], [place] [address]   Phosphorus  Date Value Ref Range Status  06/22/2019 2.4 (L) 2.5 - 4.6 mg/dL Final    Comment:    Performed at [facility], [place] [address] [place],  [address]      Radiology    No results found.  Cardiac Studies   Echocardiogram 05/14/2019: Impressions: 1. Compared to echo in March 2021, no significant change in LVEF or MR.  Pt is now in atrial fibrillation.  2. LVEF is severely depressed with severe hypokinesis/akinesis of the  base/mid inferior, inferosepal, infer walls and apical walls; hypokinesis  elsewhere . Left ventricular ejection fraction, by estimation, is 30%. The  left ventricle has severely  decreased function. The left ventricular internal cavity size was mildly  dilated. There is mild left ventricular hypertrophy. Left ventricular  diastolic parameters are indeterminate.  3. Right ventricular systolic function is normal. The right ventricular  size is normal. There is normal pulmonary artery systolic pressure.  4. Left atrial size was mildly dilated.  5. MR is directed posteriorly into LA, likely due to tethering of  posterior leaflet. Severe mitral valve regurgitation.  6. The aortic valve is tricuspid. Aortic valve regurgitation is not  visualized. Mild aortic valve sclerosis is present, with no evidence of  aortic valve stenosis.  7. The inferior vena cava is normal in size with greater than 50%  respiratory variability, suggesting right atrial pressure of 3 mmHg.   Patient Profile     64 y.o. male with a history of CAD with recent STEMI in 03/2019 s/p DES x2 to RCA and DES to RPDA and then staged PCI with DES to LAD, DES x2 to CX, and DES to OM3; chronic systolic CHF/ ischemic cardiomyopathy with EF of 30% on Echo in 04/2019; severe mitral regurgitation, paroxsymal atrial fibrillation/flutter on Amiodarone and Xarelto;  COPD; seizure disorder; and diffuse large B-cell lymphoma. Patient admitted on 06/15/2019 for hypotension and pancytopnenia secondary to chemotherapy. Cardiology consulted on 06/18/2019 for further evaluation of atrial fibrillation with RVR at the request of Dr. Louanne Belton.   Assessment & Plan    Atrial Fibrillation/Flutter with RVR: Difficult to rate control given soft BP.  Has been on IV amiodarone and low-dose metoprolol, but often had to hold metoprolol due to  low BP.  He has been loaded with amiodarone since 5/11.  Discussed with Dr. Alen Blew and while he did present with thrombocytopenia/anemia from his chemotherapy regimen, per Dr. Alen Blew the drop in counts that occurs with his chemo regimen tends to be transient and not associated with bleeding and there is no contraindication to cardioversion and committing to 1 month of uninterrupted anticoagulation.  Underwent successful DCCV this morning, currently in sinus rhythm though having PACs and occasional Wenckebach - on IV amio, will convert to PO  - continue Xarelto 20 mg qd.  Needs 1 month uninterrupted anticoagulation post cardioversion  Acute on Chronic Combined CHF: EF 30% on TTE 4/26.  Diuresed with IV lasix.  CVP 8 this AM.  Significant LE edema, but suspect this is multifactorial with low albumin (1.9) and obstruction from lymphoma contributing  - Continue PO lasix 40 mg daily - Unable to add GDMT due to low BP, has been on midodrine 2.5 mg TID  CAD, s/p STEMI in 03/2019 treated with DES x2 to RCA and DES to RPDA and then staged PCI with DES to LAD, DES x2 to CX, and DES to OM3. - on Brilinta, no ASA 2nd Xarelto - on BB and statin  Severe Mitral Regurgitation - see echo results above, is likely due to tethering of posterior leaflet - could consider Mitraclip eval once treated for lymphoma  Hypotensive - SBP 80s-100s, on midodrine 2.5 mg tid - MAP ok, asymptomatic  Electrolyte Abnormalities - K+ nadir 2.3 on 05/29, rec'd IV/PO  supplement - now on KCl 40 meq bid - K+ 3.4 today - Mg nadir 1.6, s/p IV supp but trending down, start Mag-ox 400 mg qd - Phos also low, s/p supp and improved  Recently Diagnosed High-Grade Lymphoma With extensive retroperitoneal, pelvic, and inguinal lymphadenopathy. There is also involvement of the spleen and near complete infiltration of the right kidney. 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 Suspicious for infiltrating disease of the right iliac and left pubic bones.  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

## 2019-06-23 DIAGNOSIS — E876 Hypokalemia: Secondary | ICD-10-CM

## 2019-06-23 DIAGNOSIS — I480 Paroxysmal atrial fibrillation: Secondary | ICD-10-CM

## 2019-06-23 LAB — BASIC METABOLIC PANEL
Anion gap: 12 (ref 5–15)
BUN: 16 mg/dL (ref 8–23)
CO2: 25 mmol/L (ref 22–32)
Calcium: 7.2 mg/dL — ABNORMAL LOW (ref 8.9–10.3)
Chloride: 102 mmol/L (ref 98–111)
Creatinine, Ser: 0.97 mg/dL (ref 0.61–1.24)
GFR calc Af Amer: >60 mL/min (ref >60)
GFR calc non Af Amer: >60 mL/min (ref >60)
Glucose, Bld: 81 mg/dL (ref 70–99)
Potassium: 3 mmol/L — ABNORMAL LOW (ref 3.5–5.1)
Sodium: 139 mmol/L (ref 135–145)

## 2019-06-23 LAB — CBC
HCT: 30.1 % — ABNORMAL LOW (ref 39.0–52.0)
Hemoglobin: 9.1 g/dL — ABNORMAL LOW (ref 13.0–17.0)
MCH: 25.9 pg — ABNORMAL LOW (ref 26.0–34.0)
MCHC: 30.2 g/dL (ref 30.0–36.0)
MCV: 85.8 fL (ref 80.0–100.0)
Platelets: 217 10*3/uL (ref 150–400)
RBC: 3.51 MIL/uL — ABNORMAL LOW (ref 4.22–5.81)
RDW: 18.6 % — ABNORMAL HIGH (ref 11.5–15.5)
WBC: 5 10*3/uL (ref 4.0–10.5)
nRBC: 0 % (ref 0.0–0.2)

## 2019-06-23 LAB — COOXEMETRY PANEL
Carboxyhemoglobin: 1.8 % — ABNORMAL HIGH (ref 0.5–1.5)
Methemoglobin: 0.3 % (ref 0.0–1.5)
O2 Saturation: 57.9 %
Total hemoglobin: 9.4 g/dL — ABNORMAL LOW (ref 12.0–16.0)

## 2019-06-23 LAB — PHOSPHORUS: Phosphorus: 3.2 mg/dL (ref 2.5–4.6)

## 2019-06-23 LAB — MAGNESIUM: Magnesium: 1.8 mg/dL (ref 1.7–2.4)

## 2019-06-23 MED ORDER — POTASSIUM CHLORIDE CRYS ER 20 MEQ PO TBCR
40.0000 meq | EXTENDED_RELEASE_TABLET | Freq: Three times a day (TID) | ORAL | Status: DC
  Administered 2019-06-23 – 2019-06-25 (×9): 40 meq via ORAL
  Filled 2019-06-23 (×8): qty 2

## 2019-06-23 MED ORDER — AMIODARONE HCL 200 MG PO TABS
200.0000 mg | ORAL_TABLET | Freq: Two times a day (BID) | ORAL | Status: DC
  Administered 2019-06-23 – 2019-06-30 (×14): 200 mg via ORAL
  Filled 2019-06-23 (×15): qty 1

## 2019-06-23 MED ORDER — MAGNESIUM OXIDE 400 (241.3 MG) MG PO TABS
400.0000 mg | ORAL_TABLET | Freq: Two times a day (BID) | ORAL | Status: DC
  Administered 2019-06-23 – 2019-06-30 (×14): 400 mg via ORAL
  Filled 2019-06-23 (×14): qty 1

## 2019-06-23 NOTE — Progress Notes (Signed)
Patients BP 82/64  heart rate 97.  Patient  denies any dizziness. Dr. Louanne Belton Notified and advised to continue with current treatment plan and monitor if patient becomes symptomatic.

## 2019-06-23 NOTE — Progress Notes (Addendum)
Progress Note  Patient Name: Tony Saunders Date of Encounter: 06/23/2019  Primary Cardiologist: Glenetta Hew, MD   Subjective   S/P DCCV to NSR but converted back to atrial flutter with CVR yesterday.  Remains in aflutter with HR in the 90's.  Denies any chest pain or SOB.    Inpatient Medications    Scheduled Meds: . allopurinol  300 mg Oral Daily  . amiodarone  200 mg Oral Daily  . calcium carbonate  1 tablet Oral TID  . Chlorhexidine Gluconate Cloth  6 each Topical Daily  . feeding supplement  1 Container Oral Q breakfast  . feeding supplement (ENSURE ENLIVE)  237 mL Oral BID BM  . fluconazole  100 mg Oral Daily  . furosemide  40 mg Oral Daily  . lamoTRIgine  100 mg Oral BID  . levETIRAcetam  750 mg Oral BID  . magnesium oxide  400 mg Oral Daily  . mouth rinse  15 mL Mouth Rinse BID  . metoprolol tartrate  12.5 mg Oral BID  . midodrine  2.5 mg Oral TID WC  . mirtazapine  15 mg Oral QHS  . nystatin  5 mL Oral QID  . phosphorus  250 mg Oral TID  . potassium chloride  40 mEq Oral BID  . rivaroxaban  20 mg Oral Q supper  . rosuvastatin  5 mg Oral QHS  . sertraline  25 mg Oral Daily  . sodium chloride flush  3 mL Intravenous Q12H  . sodium chloride flush  3 mL Intravenous Q12H  . ticagrelor  90 mg Oral BID   Continuous Infusions: . sodium chloride     PRN Meds: sodium chloride, acetaminophen **OR** acetaminophen, metoprolol tartrate, morphine injection, nitroGLYCERIN, ondansetron **OR** ondansetron (ZOFRAN) IV, pantoprazole, sodium chloride flush, sodium chloride flush   Vital Signs    Vitals:   06/23/19 0300 06/23/19 0400 06/23/19 0410 06/23/19 0835  BP: (!) 83/52 104/70    Pulse: 85 92    Resp: 20 (!) 29    Temp:  (!) 97.3 F (36.3 C)  (!) 97.3 F (36.3 C)  TempSrc:  Axillary  Oral  SpO2: 99% 98%    Weight:   81 kg   Height:        Intake/Output Summary (Last 24 hours) at 06/23/2019 0955 Last data filed at 06/23/2019 0552 Gross per 24 hour    Intake 240 ml  Output 1475 ml  Net -1235 ml   Last 3 Weights 06/23/2019 06/22/2019 06/21/2019  Weight (lbs) 178 lb 9.2 oz 178 lb 9.2 oz 178 lb 9.2 oz  Weight (kg) 81 kg 81 kg 81 kg      Telemetry    Atrial flutter with variable block with HRs in the 80-90's- Personally Reviewed  ECG    No new ECG tracing today. - Personally Reviewed  Physical Exam   GEN: Well nourished, well developed in no acute distress HEENT: Normal NECK: No JVD; No carotid bruits LYMPHATICS: No lymphadenopathy CARDIAC:Irregularly irregular, no murmurs, rubs, gallops RESPIRATORY:  Clear to auscultation without rales, wheezing or rhonchi  ABDOMEN: Soft, non-tender, non-distended MUSCULOSKELETAL:  No edema; No deformity  SKIN: Warm and dry NEUROLOGIC:  Alert and oriented x 3 PSYCHIATRIC:  Normal affect    Labs    High Sensitivity Troponin:   No results for input(s): TROPONINIHS in the last 720 hours.    Chemistry Recent Labs  Lab 06/20/19 0500 06/20/19 0500 06/21/19 0532 06/22/19 0500 06/23/19 0703  NA 137   < >  137 137 139  K 3.7   < > 3.9 3.4* 3.0*  CL 102   < > 101 102 102  CO2 24   < > 25 26 25   GLUCOSE 107*   < > 155* 131* 81  BUN 20   < > 19 19 16   CREATININE 0.98   < > 1.06 1.06 0.97  CALCIUM 7.4*   < > 7.7* 7.4* 7.2*  PROT 4.5*  --  4.7* 4.4*  --   ALBUMIN 2.0*  --  2.0* 1.9*  --   AST 24  --  36 40  --   ALT 29  --  32 35  --   ALKPHOS 62  --  66 61  --   BILITOT 0.8  --  0.6 0.4  --   GFRNONAA >60   < > >60 >60 >60  GFRAA >60   < > >60 >60 >60  ANIONGAP 11   < > 11 9 12    < > = values in this interval not displayed.     Hematology Recent Labs  Lab 06/21/19 0532 06/22/19 0500 06/23/19 0500  WBC 4.0 4.1 5.0  RBC 3.53* 3.02* 3.51*  HGB 9.0* 7.9* 9.1*  HCT 30.0* 25.7* 30.1*  MCV 85.0 85.1 85.8  MCH 25.5* 26.2 25.9*  MCHC 30.0 30.7 30.2  RDW 17.8* 17.9* 18.6*  PLT 158 167 217    BNPNo results for input(s): BNP, PROBNP in the last 168 hours.   Lactic Acid, Venous     Component Value Date/Time   LATICACIDVEN 1.8 06/19/2019 1503   Magnesium  Date Value Ref Range Status  06/23/2019 1.8 1.7 - 2.4 mg/dL Final    Comment:    Performed at [facility], [place] [address] [place], [place] [address]  06/22/2019 1.7 1.7 - 2.4 mg/dL Final    Comment:    Performed at [facility], [place] [address], [place], [place] [address]  06/21/2019 1.8 1.7 - 2.4 mg/dL Final    Comment:    Performed at [facility], [place] [address], [place], [place] [address]   Phosphorus  Date Value Ref Range Status  06/23/2019 3.2 2.5 - 4.6 mg/dL Final    Comment:    Performed at [facility], [place] [address], [place],  [address]      Radiology    No results found.  Cardiac Studies   Echocardiogram 05/14/2019: Impressions: 1. Compared to echo in March 2021, no significant change in LVEF or MR.  Pt is now in atrial fibrillation.  2. LVEF is severely depressed with severe hypokinesis/akinesis of the  base/mid inferior, inferosepal, infer walls and apical walls; hypokinesis  elsewhere . Left ventricular ejection fraction, by estimation, is 30%. The  left ventricle has severely  decreased function. The left ventricular internal cavity size was mildly  dilated. There is mild left ventricular hypertrophy. Left ventricular  diastolic parameters are indeterminate.  3. Right ventricular systolic function is normal. The right ventricular  size is normal. There is normal pulmonary artery systolic pressure.  4. Left atrial size was mildly dilated.  5. MR is directed posteriorly into LA, likely due to tethering of  posterior leaflet. Severe mitral valve regurgitation.  6. The aortic valve is tricuspid. Aortic valve regurgitation is not  visualized. Mild aortic valve sclerosis is present, with no evidence of  aortic valve stenosis.  7. The inferior vena cava is normal in size with greater than 50%  respiratory variability, suggesting right atrial pressure of 3 mmHg.   Patient Profile     64 y.o. male with a history of CAD with recent STEMI in 03/2019 s/p DES x2 to RCA and DES to RPDA and then staged PCI with DES to LAD, DES x2 to CX, and DES to OM3; chronic systolic CHF/ ischemic cardiomyopathy with EF of 30% on Echo in 04/2019; severe mitral regurgitation, paroxsymal atrial fibrillation/flutter on Amiodarone and Xarelto; COPD; seizure disorder; and diffuse large B-cell lymphoma. Patient admitted on 06/15/2019 for hypotension and pancytopnenia secondary to chemotherapy. Cardiology consulted on 06/18/2019 for further evaluation of atrial fibrillation with RVR at the request of Dr. Louanne Belton.   Assessment & Plan    1.  Atrial Fibrillation/Flutter with RVR -Has been on IV amiodarone and low-dose metoprolol, but often had to hold metoprolol due to low BP.   -s/p successful DCCV yesterday but went back into atrial fultter with variable block in the 80's -increase Amio to 200mg  BID over the weekend and make NPO after MN Sunday night for repeat DCCV on Monday -continue Xarelto 20 mg qd.  -recommend avoiding all QT prolonging drugs since he needs to be on Amio (he is on zofran, Zoloft)  2.  Acute on Chronic Combined CHF -EF 30% on TTE 4/26.  -Diuresed with IV lasix.   -continues to have significant LE edema, but suspect this is multifactorial with low albumin (1.9) and obstruction from lymphoma contributing  -Co-ox yesterday 78 but this am 58>>will repeat -Continue PO lasix 40 mg daily -Unable to add GDMT due to low BP, has been on midodrine 2.5 mg TID  3.  CAD   - s/p STEMI in 03/2019 treated with DES x2 to RCA and DES to RPDA and then staged PCI with DES to LAD, DES x2 to CX, and DES to OM3. -on Brilinta, no ASA 2nd Xarelto -continue on BB and statin  4.  Severe Mitral Regurgitation - see echo results above, is likely due to tethering of posterior leaflet - could consider Mitraclip eval once  treated for lymphoma  5.  Hypotensive - SBP 80s-100s, on midodrine 2.5 mg tid - MAP ok, asymptomatic  6.  Electrolyte Abnormalities - K+ nadir 2.3 on 05/29, rec'd IV/PO supplement - now on KCl 40 meq bid - K+ 3.0 today - increase KCL to 48meq TID - Mg nadir 1.6, s/p IV supp  - Mag 1.8 today - increase Mag oxide to 400mg  BID - BMET in am  7.  Recently Diagnosed High-Grade Lymphoma  - extensive retroperitoneal, pelvic, and inguinal lymphadenopathy. There is also involvement of the spleen and near complete infiltration of the right kidney.  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  -Suspicious for infiltrating disease of the right iliac and left pubic bones.  -chemo started 05/19, now on hold  8.  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  > 50% of time was spent in direct patient care.     For questions or updates, please contact [place] Please consult [email or web address] for contact info under      Signed, [name], MD  06/23/2019, 9:55 AM

## 2019-06-23 NOTE — Evaluation (Signed)
Physical Therapy Evaluation Patient Details Name: Tony Saunders MRN: [id number] DOB: 12/29/1955 Today's Date: 06/23/2019   History of Present Illness  Pt to ED 2* weakness and admitted with severe pancytopenia 2* chemo and increasing L LE edema 2* ongoing Lymphoma.  Pt with hx of MI, seizure disorder, CHF, PAF - pt underwent Cardioversion 06/22/19  Clinical Impression  Pt admitted as above and presenting with functional mobility limitations 2* generalized weakness, balance deficits and very limited endurance.  Pt hopes to progress to dc home with assist of family and would benefit from use of 3n1 as well as HHPT follow up.    Follow Up Recommendations Home health PT    Equipment Recommendations  3in1 (PT)    Recommendations for Other Services OT consult     Precautions / Restrictions Precautions Precautions: Fall Restrictions Weight Bearing Restrictions: No      Mobility  Bed Mobility Overal bed mobility: Needs Assistance Bed Mobility: Supine to Sit     Supine to sit: Min assist;Mod assist     General bed mobility comments: Assist to complete modified roll, bring LEs over EOB and bring trunk to upright  Transfers Overall transfer level: Needs assistance Equipment used: Rolling walker (2 wheeled) Transfers: Sit to/from Stand Sit to Stand: Min assist;+2 physical assistance;+2 safety/equipment;From elevated surface         General transfer comment: cues for LE management and use of UEs to self assist.  Physical assist to bring wt up and fwd and to balance in initial standing  Ambulation/Gait Ambulation/Gait assistance: Min assist;Mod assist;+2 safety/equipment Gait Distance (Feet): 10 Feet Assistive device: Rolling walker (2 wheeled) Gait Pattern/deviations: Step-to pattern;Decreased step length - right;Decreased step length - left;Shuffle;Trunk flexed Gait velocity: decr   General Gait Details: cues for posture and position from RW; chair follow for safety;  distance ltd by LE fatigue.  Stairs            Wheelchair Mobility    Modified Rankin (Stroke Patients Only)       Balance Overall balance assessment: Needs assistance Sitting-balance support: Feet supported;Bilateral upper extremity supported Sitting balance-Leahy Scale: Fair     Standing balance support: Bilateral upper extremity supported Standing balance-Leahy Scale: Poor Standing balance comment: post drift                             Pertinent Vitals/Pain Pain Assessment: No/denies pain    Home Living Family/patient expects to be discharged to:: Private residence Living Arrangements: Spouse/significant other Available Help at Discharge: Family;Available 24 hours/day Type of Home: House Home Access: Stairs to enter   [employer] of Steps: 1 Home Layout: Two level;1/2 bath on main level Home Equipment: Walker - standard;Wheelchair - manual      Prior Function Level of Independence: Needs assistance   Gait / Transfers Assistance Needed: Pt used walker with 4 "skids" and assist of spouse  ADL's / Homemaking Assistance Needed: Sponge bathes on main level with assist of spouse  Comments: Pt uses walker in home and w/c for distance.  Single step into home with walker vs wc dependent on pt ability on given day     Hand Dominance        Extremity/Trunk Assessment   Upper Extremity Assessment Upper Extremity Assessment: Generalized weakness    Lower Extremity Assessment Lower Extremity Assessment: Generalized weakness;LLE deficits/detail LLE Deficits / Details: noted edema L LE - spouse states much improved since admit  Communication   Communication: HOH  Cognition Arousal/Alertness: Awake/alert Behavior During Therapy: WFL for tasks assessed/performed;Flat affect Overall Cognitive Status: Within Functional Limits for tasks assessed                                        General Comments       Exercises     Assessment/Plan    PT Assessment Patient needs continued PT services  PT Problem List Decreased strength;Decreased activity tolerance;Decreased balance;Decreased mobility;Decreased knowledge of use of DME       PT Treatment Interventions DME instruction;Gait training;Stair training;Functional mobility training;Therapeutic activities;Therapeutic exercise;Patient/family education;Balance training    PT Goals (Current goals can be found in the Care Plan section)  Acute Rehab PT Goals Patient Stated Goal: Regain IND PT Goal Formulation: With patient Time For Goal Achievement: 07/07/19 Potential to Achieve Goals: Fair    Frequency Min 3X/week   Barriers to discharge        Co-evaluation               AM-PAC PT "6 Clicks" Mobility  Outcome Measure Help needed turning from your back to your side while in a flat bed without using bedrails?: A Little Help needed moving from lying on your back to sitting on the side of a flat bed without using bedrails?: A Little Help needed moving to and from a bed to a chair (including a wheelchair)?: A Lot Help needed standing up from a chair using your arms (e.g., wheelchair or bedside chair)?: A Lot Help needed to walk in hospital room?: A Lot Help needed climbing 3-5 steps with a railing? : A Lot 6 Click Score: 14    End of Session Equipment Utilized During Treatment: Gait belt Activity Tolerance: Patient limited by fatigue Patient left: in chair;with call bell/phone within reach;with chair alarm set;with family/visitor present Nurse Communication: Mobility status PT Visit Diagnosis: Difficulty in walking, not elsewhere classified (R26.2);Muscle weakness (generalized) (M62.81)    Time: 3007-6226 PT Time Calculation (min) (ACUTE ONLY): 24 min   Charges:   PT Evaluation $PT Eval Low Complexity: 1 Low PT Treatments $Gait Training: 8-22 mins        [place] Pager  [phone] Office [phone]   Ashely Goosby 06/23/2019, 2:00 PM

## 2019-06-23 NOTE — Progress Notes (Signed)
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  During hospitalization, patient developed the atrial flutter with rapid ventricular response and cardiology was consulted patient was then transferred to the stepdown unit for closer monitoring and treatment.  He was started on amiodarone drip and underwent cardioversion.  Patient was also initiated on radiation treatment for B-cell lymphoma.  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  Cardiology on board.  Amiodarone dose has been increased.  Plan is repeat cardioversion on 06/25/2019.Marland Kitchen  On po lasix.  Continue PO amiodarone,  Brilinta and Xarelto. Follow cardiology recommendations  Pancytopenia secondary to chemotherapy, symptomatic anemia.   Status post 3 units of packed RBC.  Hemoglobin  at 9.1.  WBC has normalized at 5.0.  Platelets at 217.  On Brilinta and Xarelto.  No evidence of active bleeding .  If downtrending hemoglobin, might need blood transfusion.  Oropharyngeal candidiasis secondary to chemotherapy On Diflucan, nystatin.  Currently on regular consistency diet.  Hypokalemia, hypophosphatemia Patient with 3.0.  Will replenish with p.o. potassium 3 times daily.  Magnesium 1.8.  Was 3.2.  Colonic ileus  Improved.  Tolerating oral diet.  Diffuse B-cell lymphoma Patient will need continued outpatient follow-up.  Patient was started on R-CHOP on Jun 06, 2019.  Currently undergoing radiation treatment during hospitalization.  Hypotension --On midodrine.  Likely secondary to systolic cardiomyopathy.  On decreased dose of metoprolol IV  Seizure disorder  Continue Lamictal and Keppra  VTE Prophylaxis: Xarelto  Code Status: Full code  Family Communication:  I again spoke with the patient's wife [name] and updated her about the clinical condition.  She is concerned about a repeat cardioversion and was wondering whether Dr. Haroldine Laws has been notified or not.  I have encouraged her to discuss this with the cardiologist.  Status is: Inpatient  Remains inpatient appropriate because: status post cardio version, stepdown unit care, patient with multiple comorbidities including lymphoma needing radiation.   Dispo: The patient is from: Home              Anticipated d/c is to: Home with home health              Anticipated d/c date is: 2 to 3 days.              Patient currently is not medically stable to d/c.  Consultants:  Oncology  Cardiology  Procedures:  PRBC transfusion  DC cardioversion on 06/22/2019  Antibiotics/antifungals:  . Fluconazole  Anti-infectives (From admission, onward)  Start     Dose/Rate Route Frequency Ordered Stop   06/17/19 1000  fluconazole (DIFLUCAN) tablet 100 mg     100 mg Oral Daily 06/16/19 0915     06/16/19 1000   fluconazole (DIFLUCAN) IVPB 200 mg     200 mg 100 mL/hr over 60 Minutes Intravenous  Once 06/16/19 0912 06/16/19 1121     Subjective: Today, denies any chest pain, shortness of breath, fever or chills.  Objective: Vitals:   06/23/19 0300 06/23/19 0400  BP: (!) 83/52 104/70  Pulse: 85 92  Resp: 20 (!) 29  Temp:  (!) 97.3 F (36.3 C)  SpO2: 99% 98%    Intake/Output Summary (Last 24 hours) at 06/23/2019 0703 Last data filed at 06/23/2019 0552 Gross per 24 hour  Intake 356.63 ml  Output 1650 ml  Net -1293.37 ml   Filed Weights   06/21/19 0500 06/22/19 0500 06/23/19 0410  Weight: 81 kg 81 kg 81 kg   Body mass index is 24.22 kg/m.   Physical Exam: ?General: Thinly built, alert awake oriented, chronically ill, not in obvious distress HENT: Mild pallor noted. Oral mucosa is moist.  Chest:  Clear breath sounds.  Diminished breath sounds bilaterally.  CVS: S1 &S2 heard.  Systolic murmur noted, irregular rhythm. Abdomen: Soft, mild nonspecific tenderness of the lower abdomen.  Bowel sounds are heard.   Extremities: No cyanosis, clubbing but with bilateral lower extremity pitting pedal edema.  Peripheral pulses are palpable. Psych: Alert, awake and oriented, normal mood CNS:  No cranial nerve deficits.  Power equal in all extremities.   Skin: Warm and dry.  Right dorsal foot with bruising . petechiae  Data Review: I have personally reviewed the following laboratory data and studies,  CBC: Recent Labs  Lab 06/16/19 0817 06/16/19 0817 06/17/19 0300 06/17/19 0300 06/18/19 0330 06/19/19 0528 06/21/19 0532 06/22/19 0500 06/23/19 0500  WBC 0.2*   < > 0.4*   < > 1.4* 3.5* 4.0 4.1 5.0  NEUTROABS 0.0*  --  0.3*  --  1.0* 2.8  --   --   --   HGB 8.1*   < > 8.2*   < > 8.2* 9.0* 9.0* 7.9* 9.1*  HCT 25.3*   < > 25.8*   < > 26.1* 29.1* 30.0* 25.7* 30.1*  MCV 80.8   < > 82.2   < > 83.1 83.4 85.0 85.1 85.8  PLT 36*   < > 39*   < > 54* 99* 158 167 217   < > = values in this interval  not displayed.   Basic Metabolic Panel: Recent Labs  Lab 06/18/19 0330 06/19/19 0528 06/20/19 0500 06/21/19 0532 06/22/19 0500  NA 134* 135 137 137 137  K 3.4* 4.3 3.7 3.9 3.4*  CL 102 100 102 101 102  CO2 24 24 24 25 26   GLUCOSE 97 123* 107* 155* 131*  BUN 14 17 20 19 19   CREATININE 0.91 0.98 0.98 1.06 1.06  CALCIUM 7.4* 7.7* 7.4* 7.7* 7.4*  MG 2.0 1.9 1.7 1.8 1.7  PHOS 1.8* 3.6 3.6 3.0 2.4*   Liver Function Tests: Recent Labs  Lab 06/20/19 0500 06/21/19 0532 06/22/19 0500  AST 24 36 40  ALT 29 32 35  ALKPHOS 62 66 61  BILITOT 0.8 0.6 0.4  PROT 4.5* 4.7* 4.4*  ALBUMIN 2.0* 2.0* 1.9*   No results for input(s): LIPASE, AMYLASE in the last 168 hours. No results for input(s): AMMONIA in the last 168 hours. Cardiac Enzymes: No  results for input(s): CKTOTAL, CKMB, CKMBINDEX, TROPONINI in the last 168 hours. BNP (last 3 results) No results for input(s): BNP in the last 8760 hours.  ProBNP (last 3 results) No results for input(s): PROBNP in the last 8760 hours.  CBG: No results for input(s): GLUCAP in the last 168 hours. Recent Results (from the past 240 hour(s))  SARS Coronavirus 2 by RT PCR (hospital order, performed in [facility] hospital lab) Nasopharyngeal Nasopharyngeal Swab     Status: None   Collection Time: 06/15/19  4:47 PM   Specimen: Nasopharyngeal Swab  Result Value Ref Range Status   SARS Coronavirus 2 NEGATIVE NEGATIVE Final    Comment: (NOTE) SARS-CoV-2 target nucleic acids are NOT DETECTED. The SARS-CoV-2 RNA is generally detectable in upper and lower respiratory specimens during the acute phase of infection. The lowest concentration of SARS-CoV-2 viral copies this assay can detect is 250 copies / mL. A negative result does not preclude SARS-CoV-2 infection and should not be used as the sole basis for treatment or other patient management decisions.  A negative result may occur with improper specimen collection / handling, submission of specimen  other than nasopharyngeal swab, presence of viral mutation(s) within the areas targeted by this assay, and inadequate number of viral copies (<250 copies / mL). A negative result must be combined with clinical observations, patient history, and epidemiological information. Fact Sheet for Patients:   StrictlyIdeas.no Fact Sheet for Healthcare Providers: BankingDealers.co.za This test is not yet approved or cleared  by the [place] FDA and has been authorized for detection and/or diagnosis of SARS-CoV-2 by FDA under an Emergency Use Authorization (EUA).  This EUA will remain in effect (meaning this test can be used) for the duration of the COVID-19 declaration under Section 564(b)(1) of the Act, 21 U.S.C. section 360bbb-3(b)(1), unless the authorization is terminated or revoked sooner. Performed at [facility], [place] [address], [place], [place] [address]   MRSA PCR Screening     Status: None   Collection Time: 06/18/19  5:43 PM   Specimen: Nasal Mucosa; Nasopharyngeal  Result Value Ref Range Status   MRSA by PCR NEGATIVE NEGATIVE Final    Comment:        The GeneXpert MRSA Assay (FDA approved for NASAL specimens only), is one component of a comprehensive MRSA colonization surveillance program. It is not intended to diagnose MRSA infection nor to guide or monitor treatment for MRSA infections. Performed at [facility], [place] [address] [place], [place] [address]      Studies: No results found.    Flora Lipps, MD  [facility] 06/23/2019

## 2019-06-24 ENCOUNTER — Inpatient Hospital Stay (HOSPITAL_COMMUNITY): Payer: No Typology Code available for payment source

## 2019-06-24 DIAGNOSIS — I361 Nonrheumatic tricuspid (valve) insufficiency: Secondary | ICD-10-CM

## 2019-06-24 DIAGNOSIS — I340 Nonrheumatic mitral (valve) insufficiency: Secondary | ICD-10-CM

## 2019-06-24 LAB — URINALYSIS, ROUTINE W REFLEX MICROSCOPIC
Bilirubin Urine: NEGATIVE
Glucose, UA: NEGATIVE mg/dL
Ketones, ur: NEGATIVE mg/dL
Leukocytes,Ua: NEGATIVE
Nitrite: NEGATIVE
Protein, ur: NEGATIVE mg/dL
Specific Gravity, Urine: 1.021 (ref 1.005–1.030)
pH: 5 (ref 5.0–8.0)

## 2019-06-24 LAB — BASIC METABOLIC PANEL
Anion gap: 11 (ref 5–15)
BUN: 15 mg/dL (ref 8–23)
CO2: 27 mmol/L (ref 22–32)
Calcium: 7.3 mg/dL — ABNORMAL LOW (ref 8.9–10.3)
Chloride: 102 mmol/L (ref 98–111)
Creatinine, Ser: 0.9 mg/dL (ref 0.61–1.24)
GFR calc Af Amer: >60 mL/min (ref >60)
GFR calc non Af Amer: >60 mL/min (ref >60)
Glucose, Bld: 80 mg/dL (ref 70–99)
Potassium: 3.5 mmol/L (ref 3.5–5.1)
Sodium: 140 mmol/L (ref 135–145)

## 2019-06-24 LAB — ECHOCARDIOGRAM COMPLETE
Height: 72 in
Weight: 2800.72 oz

## 2019-06-24 LAB — CBC
HCT: 28.1 % — ABNORMAL LOW (ref 39.0–52.0)
Hemoglobin: 8.4 g/dL — ABNORMAL LOW (ref 13.0–17.0)
MCH: 25.8 pg — ABNORMAL LOW (ref 26.0–34.0)
MCHC: 29.9 g/dL — ABNORMAL LOW (ref 30.0–36.0)
MCV: 86.5 fL (ref 80.0–100.0)
Platelets: 208 10*3/uL (ref 150–400)
RBC: 3.25 MIL/uL — ABNORMAL LOW (ref 4.22–5.81)
RDW: 18.4 % — ABNORMAL HIGH (ref 11.5–15.5)
WBC: 4.3 10*3/uL (ref 4.0–10.5)
nRBC: 0 % (ref 0.0–0.2)

## 2019-06-24 LAB — MAGNESIUM: Magnesium: 1.9 mg/dL (ref 1.7–2.4)

## 2019-06-24 LAB — COOXEMETRY PANEL
Carboxyhemoglobin: 2 % — ABNORMAL HIGH (ref 0.5–1.5)
Methemoglobin: 0.5 % (ref 0.0–1.5)
O2 Saturation: 87.7 %
Total hemoglobin: 8.6 g/dL — ABNORMAL LOW (ref 12.0–16.0)

## 2019-06-24 NOTE — Progress Notes (Signed)
  Echocardiogram 2D Echocardiogram has been performed.  Merrie Roof F 06/24/2019, 11:21 AM

## 2019-06-24 NOTE — Progress Notes (Signed)
Progress Note  Patient Name: Tony Saunders Date of Encounter: 06/24/2019  Primary Cardiologist: Glenetta Hew, MD   Subjective   S/P DCCV to NSR but converted back to atrial flutter with CVR.  Has had hypotension since yesterday. SBP currently in the 70-80's. Denies any chest pain or SOB.   Inpatient Medications    Scheduled Meds: . allopurinol  300 mg Oral Daily  . amiodarone  200 mg Oral BID  . calcium carbonate  1 tablet Oral TID  . Chlorhexidine Gluconate Cloth  6 each Topical Daily  . feeding supplement  1 Container Oral Q breakfast  . feeding supplement (ENSURE ENLIVE)  237 mL Oral BID BM  . fluconazole  100 mg Oral Daily  . furosemide  40 mg Oral Daily  . lamoTRIgine  100 mg Oral BID  . levETIRAcetam  750 mg Oral BID  . magnesium oxide  400 mg Oral BID  . mouth rinse  15 mL Mouth Rinse BID  . metoprolol tartrate  12.5 mg Oral BID  . midodrine  2.5 mg Oral TID WC  . mirtazapine  15 mg Oral QHS  . nystatin  5 mL Oral QID  . phosphorus  250 mg Oral TID  . potassium chloride  40 mEq Oral TID  . rivaroxaban  20 mg Oral Q supper  . rosuvastatin  5 mg Oral QHS  . sodium chloride flush  3 mL Intravenous Q12H  . sodium chloride flush  3 mL Intravenous Q12H  . ticagrelor  90 mg Oral BID   Continuous Infusions: . sodium chloride     PRN Meds: sodium chloride, acetaminophen **OR** acetaminophen, metoprolol tartrate, morphine injection, nitroGLYCERIN, pantoprazole, sodium chloride flush, sodium chloride flush   Vital Signs    Vitals:   06/24/19 0400 06/24/19 0500 06/24/19 0600 06/24/19 0800  BP: (!) 84/49 (!) 88/59    Pulse: 91 96 98   Resp: (!) 24 (!) 22 (!) 21   Temp: (!) 97 F (36.1 C)   (!) 97.3 F (36.3 C)  TempSrc: Axillary   Oral  SpO2: 95% 97% 91%   Weight:  79.4 kg    Height:        Intake/Output Summary (Last 24 hours) at 06/24/2019 0906 Last data filed at 06/24/2019 0600 Gross per 24 hour  Intake --  Output 1525 ml  Net -1525 ml   Last 3  Weights 06/24/2019 06/23/2019 06/22/2019  Weight (lbs) 175 lb 0.7 oz 178 lb 9.2 oz 178 lb 9.2 oz  Weight (kg) 79.4 kg 81 kg 81 kg      Telemetry    Aflutter with HR in the 80's-90's- Personally Reviewed  ECG    Aflutter with variable AV block - Personally Reviewed  Physical Exam   GEN: Well nourished, well developed in no acute distress HEENT: Normal NECK: No JVD; No carotid bruits LYMPHATICS: No lymphadenopathy CARDIAC:irregularly irregular, no murmurs, rubs, gallops RESPIRATORY:  Clear to auscultation without rales, wheezing or rhonchi  ABDOMEN: Soft, non-tender, non-distended MUSCULOSKELETAL:  no edema; No deformity  SKIN: Warm and dry NEUROLOGIC:  Alert and oriented x 3 PSYCHIATRIC:  Normal affect     Labs    High Sensitivity Troponin:   No results for input(s): TROPONINIHS in the last 720 hours.    Chemistry Recent Labs  Lab 06/20/19 0500 06/20/19 0500 06/21/19 0532 06/22/19 0500 06/23/19 0703  NA 137   < > 137 137 139  K 3.7   < > 3.9 3.4* 3.0*  CL 102   < > 101 102 102  CO2 24   < > 25 26 25   GLUCOSE 107*   < > 155* 131* 81  BUN 20   < > 19 19 16   CREATININE 0.98   < > 1.06 1.06 0.97  CALCIUM 7.4*   < > 7.7* 7.4* 7.2*  PROT 4.5*  --  4.7* 4.4*  --   ALBUMIN 2.0*  --  2.0* 1.9*  --   AST 24  --  36 40  --   ALT 29  --  32 35  --   ALKPHOS 62  --  66 61  --   BILITOT 0.8  --  0.6 0.4  --   GFRNONAA >60   < > >60 >60 >60  GFRAA >60   < > >60 >60 >60  ANIONGAP 11   < > 11 9 12    < > = values in this interval not displayed.     Hematology Recent Labs  Lab 06/21/19 0532 06/22/19 0500 06/23/19 0500  WBC 4.0 4.1 5.0  RBC 3.53* 3.02* 3.51*  HGB 9.0* 7.9* 9.1*  HCT 30.0* 25.7* 30.1*  MCV 85.0 85.1 85.8  MCH 25.5* 26.2 25.9*  MCHC 30.0 30.7 30.2  RDW 17.8* 17.9* 18.6*  PLT 158 167 217    BNPNo results for input(s): BNP, PROBNP in the last 168 hours.   Lactic Acid, Venous    Component Value Date/Time   LATICACIDVEN 1.8 06/19/2019 1503    Magnesium  Date Value Ref Range Status  06/23/2019 1.8 1.7 - 2.4 mg/dL Final    Comment:    Performed at [facility], [place] [address] [place], [place] [address]  06/22/2019 1.7 1.7 - 2.4 mg/dL Final    Comment:    Performed at [facility], [place] [address] [place], [place] [address]  06/21/2019 1.8 1.7 - 2.4 mg/dL Final    Comment:    Performed at [facility], [place] [address], [place], [place] [address]   Phosphorus  Date Value Ref Range Status  06/23/2019 3.2 2.5 - 4.6 mg/dL Final    Comment:    Performed at [facility], [place] [address], [place], [place] [address]      Radiology    No results found.  Cardiac Studies   Echocardiogram 05/14/2019: Impressions: 1. Compared to echo in March 2021, no significant change in LVEF or MR.  Pt is now in atrial fibrillation.  2. LVEF is severely depressed with severe hypokinesis/akinesis of the  base/mid inferior, inferosepal, infer walls and apical walls; hypokinesis  elsewhere . Left ventricular ejection fraction, by estimation, is 30%. The  left ventricle has severely  decreased function. The left ventricular internal cavity size was mildly  dilated. There is mild left ventricular hypertrophy. Left ventricular  diastolic parameters are indeterminate.  3. Right ventricular systolic function is normal. The right ventricular  size is normal. There is normal pulmonary artery systolic pressure.  4. Left atrial size was mildly dilated.  5. MR is directed posteriorly into LA, likely due to tethering of  posterior leaflet. Severe mitral valve regurgitation.  6. The aortic valve is tricuspid. Aortic valve regurgitation is not  visualized. Mild aortic valve sclerosis is present, with no evidence of  aortic valve stenosis.  7. The inferior vena cava is normal in size with greater than 50%  respiratory variability, suggesting right atrial pressure of 3  mmHg.   Patient Profile  64 y.o. male with a history of CAD with recent STEMI in 03/2019 s/p DES x2 to RCA and DES to RPDA and then staged PCI with DES to LAD, DES x2 to CX, and DES to OM3; chronic systolic CHF/ ischemic cardiomyopathy with EF of 30% on Echo in 04/2019; severe mitral regurgitation, paroxsymal atrial fibrillation/flutter on Amiodarone and Xarelto; COPD; seizure disorder; and diffuse large B-cell lymphoma. Patient admitted on 06/15/2019 for hypotension and pancytopnenia secondary to chemotherapy. Cardiology consulted on 06/18/2019 for further evaluation of atrial fibrillation with RVR at the request of Dr. Louanne Belton.   Assessment & Plan    1.  Atrial Fibrillation/Flutter with RVR -Has been on IV amiodarone and low-dose metoprolol, but often had to hold metoprolol due to low BP.   -s/p successful DCCV last week but went back into atrial fultter with variable block in the 80's - Amio increased to 200mg  BID over the weekend to reload and make NPO after MN tonight for repeat DCCV on Monday -continue Xarelto 20 mg qd.  -recommend avoiding all QT prolonging drugs since he needs to be on Amio (he is on zofran, Zoloft)>>QTc ok today  2.  Acute on Chronic Combined CHF -EF 30% on TTE 4/26.  -Diuresed with IV lasix.   -he put out 1.5L yesterday and is net neg 1.75L -continues to have significant LE edema, but suspect this is multifactorial with low albumin (1.9) and obstruction from lymphoma contributing  -Co-ox a few days ago was78 but yesterday 92 -now Hypotensive ? Etiology>>will repeat co-ox and get a 2D echo to rule out pericardial effusion -Hold Lasix due to hypotension -continue midodrine 2.5 mg TID  3.  CAD   - s/p STEMI in 03/2019 treated with DES x2 to RCA and DES to RPDA and then staged PCI with DES to LAD, DES x2 to CX, and DES to OM3. -on Brilinta, no ASA 2nd Xarelto -continue statin -hold BB due to hypotension  4.  Severe Mitral Regurgitation - see echo results above, is  likely due to tethering of posterior leaflet - could consider Mitraclip eval once treated for lymphoma  5.  Hypotensive - SBP 80s-100s, on midodrine 2.5 mg tid - MAP ok, asymptomatic  6.  Electrolyte Abnormalities - K+ nadir 2.3 on 05/29, rec'd IV/PO supplement - now on KCl 40 meq TID - K+today pending - Mg nadir 1.6, s/p IV supp  - Mag pending today - continue Mag oxide to 400mg  BID - BMET this am pending  7.  Recently Diagnosed High-Grade Lymphoma  - extensive retroperitoneal, pelvic, and inguinal lymphadenopathy. There is also involvement of the spleen and near complete infiltration of the right kidney.  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  -Suspicious for infiltrating disease of the right iliac and left pubic bones.  -chemo started 05/19, now on hold -check 2D echo to rule out pericardial effusion given hypotension  8.  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  > 50% of time was spent in direct patient care.     For questions or updates, please contact [place] Please consult [email or web address] for contact info under      Signed, [name], MD   06/24/2019, 9:06 AM

## 2019-06-24 NOTE — Progress Notes (Signed)
2D echo reviewed and shows no change from prior echo with EF 35-40% with severe MR>  Co-ox today 87%.  Hypotension unlikely cardiac in etiology.  ? Sepsis. Will make NPO after MN for DCCV tomorrow.

## 2019-06-24 NOTE — Progress Notes (Signed)
PROGRESS NOTE  Tony Saunders QBH:[id number] DOB: 1955-06-22 DOA: 06/15/2019 PCP: [name], [name], MD   LOS: 9 days   Brief narrative: As per HPI and previous provider,  64 year old male with diffuse B-cell lymphomaMay2021, started chemotherapy 5/19, presented for an outpatient port flushandradiation radiation simulation, found to be hypotensive and pancytopenic and was admitted to hospital for further evaluation and treatment.  During hospitalization, patient developed the atrial flutter with rapid ventricular response and cardiology was consulted patient was then transferred to the stepdown unit for closer monitoring and treatment.  He was started on amiodarone drip and underwent cardioversion.  He briefly converted but was again in flutter.  Patient was also initiated on radiation treatment for B-cell lymphoma.  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  Cardiology on board.  Amiodarone dose has been increased.  Plan is repeat cardioversion on 06/25/2019.  On po lasix.  Continue PO amiodarone,  Brilinta and Xarelto. Follow cardiology recommendations.  Discontinued QT prolonging drugs including Zofran and Zoloft.  2D echocardiogram today with  ejection fraction of 35 to 40% with severe MR.  Cardiology plans for DC cardioversion tomorrow.  Pancytopenia secondary to chemotherapy, symptomatic anemia.   Status post 3 units of packed RBC this admission.  Hemoglobin at 8.4 today from 9.1, no evidence of bleeding.  WBC has normalized at 4.3.  Platelets at 208.Marland Kitchen  On Brilinta and Xarelto.  No evidence of active bleeding .  If downtrending hemoglobin, might need blood transfusion.  Oropharyngeal candidiasis secondary to chemotherapy On Diflucan, nystatin.  Currently on regular consistency diet.  Hypokalemia, hypophosphatemia on p.o. potassium 3 times daily.  Phosphorus of 3.2 today.  Check BMP in a.m.  Colonic ileus  Improved.  Tolerating oral diet.  Diffuse B-cell lymphoma Patient will need continued outpatient follow-up.  Patient was started on R-CHOP on Jun 06, 2019.  Currently undergoing radiation treatment during hospitalization.  Hypotension On midodrine.  Likely secondary to systolic cardiomyopathy.  On decreased dose of metoprolol IV.  No fever or leukocytosis.  No obvious source of infection at this time has improved.  Will consider chest x-ray urinalysis for baseline evaluation.  Seizure disorder  Continue Lamictal and Keppra  VTE Prophylaxis: Xarelto  Code Status: Full code  Family Communication:  Spoke with the patient's wife at bedside today.  Answered queries to the best of my knowledge.  I have encouraged her to discuss with cardiology regarding ablation and other cardiac issues.  Status is: Inpatient  Remains inpatient appropriate because: status post cardio version, stepdown unit care, patient with multiple comorbidities including lymphoma needing radiation.   Dispo: The patient is from: Home              Anticipated d/c is to: Home with home health              Anticipated d/c date  is: 2 to 3 days.              Patient currently is not medically stable to  d/c.  Consultants:  Oncology  Cardiology  Procedures:  PRBC transfusion  DC cardioversion on 06/22/2019  Antibiotics/antifungals:  . Fluconazole  Anti-infectives (From admission, onward)   Start     Dose/Rate Route Frequency Ordered Stop   06/17/19 1000  fluconazole (DIFLUCAN) tablet 100 mg     100 mg Oral Daily 06/16/19 0915     06/16/19 1000  fluconazole (DIFLUCAN) IVPB 200 mg     200 mg 100 mL/hr over 60 Minutes Intravenous  Once 06/16/19 0912 06/16/19 1121     Subjective: Today, patient denies any chest pain, palpitation, fever or chills.  No nausea vomiting    Objective: Vitals:   06/24/19 0500 06/24/19 0600  BP: (!) 88/59   Pulse: 96 98  Resp: (!) 22 (!) 21  Temp:    SpO2: 97% 91%    Intake/Output Summary (Last 24 hours) at 06/24/2019 0710 Last data filed at 06/24/2019 0600 Gross per 24 hour  Intake --  Output 1525 ml  Net -1525 ml   Filed Weights   06/22/19 0500 06/23/19 0410 06/24/19 0500  Weight: 81 kg 81 kg 79.4 kg   Body mass index is 23.74 kg/m.   Physical Exam:  General:  thinly built, not in obvious distress, alert awake oriented.  Chronically ill. HENT:   Mild pallor noted. Chest:  Clear breath sounds.  Diminished breath sounds bilaterally. No crackles or wheezes.  CVS: S1 &S2 heard.  Systolic murmur.  Irregular rhythm. Abdomen: Soft, nontender, nondistended.  Bowel sounds are heard.   Extremities: No cyanosis, clubbing but with peripheral pitting edema peripheral pulses are palpable. Psych: Alert, awake and oriented, normal mood CNS:  No cranial nerve deficits.  Power equal in all extremities.   Skin: Warm and dry.  Petechiae.   Data Review: I have personally reviewed the following laboratory data and studies,  CBC: Recent Labs  Lab 06/18/19 0330 06/19/19 0528 06/21/19 0532 06/22/19 0500 06/23/19 0500  WBC 1.4* 3.5* 4.0 4.1 5.0  NEUTROABS 1.0* 2.8  --   --   --   HGB 8.2* 9.0* 9.0* 7.9* 9.1*  HCT 26.1* 29.1* 30.0* 25.7* 30.1*   MCV 83.1 83.4 85.0 85.1 85.8  PLT 54* 99* 158 167 315   Basic Metabolic Panel: Recent Labs  Lab 06/19/19 0528 06/20/19 0500 06/21/19 0532 06/22/19 0500 06/23/19 0703  NA 135 137 137 137 139  K 4.3 3.7 3.9 3.4* 3.0*  CL 100 102 101 102 102  CO2 24 24 25 26 25   GLUCOSE 123* 107* 155* 131* 81  BUN 17 20 19 19 16   CREATININE 0.98 0.98 1.06 1.06 0.97  CALCIUM 7.7* 7.4* 7.7* 7.4* 7.2*  MG 1.9 1.7 1.8 1.7 1.8  PHOS 3.6 3.6 3.0 2.4* 3.2   Liver Function Tests: Recent Labs  Lab 06/20/19 0500 06/21/19 0532 06/22/19 0500  AST 24 36 40  ALT 29 32 35  ALKPHOS 62 66 61  BILITOT 0.8 0.6 0.4  PROT 4.5* 4.7* 4.4*  ALBUMIN 2.0* 2.0* 1.9*   No results for input(s): LIPASE, AMYLASE in the last 168 hours. No results for input(s): AMMONIA in the last 168 hours. Cardiac Enzymes: No results for input(s): CKTOTAL, CKMB, CKMBINDEX, TROPONINI in the last 168 hours. BNP (last 3 results) No results for input(s): BNP in the last 8760 hours.  ProBNP (last 3 results) No  results for input(s): PROBNP in the last 8760 hours.  CBG: No results for input(s): GLUCAP in the last 168 hours. Recent Results (from the past 240 hour(s))  SARS Coronavirus 2 by RT PCR (hospital order, performed in [facility] hospital lab) Nasopharyngeal Nasopharyngeal Swab     Status: None   Collection Time: 06/15/19  4:47 PM   Specimen: Nasopharyngeal Swab  Result Value Ref Range Status   SARS Coronavirus 2 NEGATIVE NEGATIVE Final    Comment: (NOTE) SARS-CoV-2 target nucleic acids are NOT DETECTED. The SARS-CoV-2 RNA is generally detectable in upper and lower respiratory specimens during the acute phase of infection. The lowest concentration of SARS-CoV-2 viral copies this assay can detect is 250 copies / mL. A negative result does not preclude SARS-CoV-2 infection and should not be used as the sole basis for treatment or other patient management decisions.  A negative result may occur with improper specimen  collection / handling, submission of specimen other than nasopharyngeal swab, presence of viral mutation(s) within the areas targeted by this assay, and inadequate number of viral copies (<250 copies / mL). A negative result must be combined with clinical observations, patient history, and epidemiological information. Fact Sheet for Patients:   StrictlyIdeas.no Fact Sheet for Healthcare Providers: BankingDealers.co.za This test is not yet approved or cleared  by the [place] FDA and has been authorized for detection and/or diagnosis of SARS-CoV-2 by FDA under an Emergency Use Authorization (EUA).  This EUA will remain in effect (meaning this test can be used) for the duration of the COVID-19 declaration under Section 564(b)(1) of the Act, 21 U.S.C. section 360bbb-3(b)(1), unless the authorization is terminated or revoked sooner. Performed at [facility], [place] [address] [place], [place] [address]   MRSA PCR Screening     Status: None   Collection Time: 06/18/19  5:43 PM   Specimen: Nasal Mucosa; Nasopharyngeal  Result Value Ref Range Status   MRSA by PCR NEGATIVE NEGATIVE Final    Comment:        The GeneXpert MRSA Assay (FDA approved for NASAL specimens only), is one component of a comprehensive MRSA colonization surveillance program. It is not intended to diagnose MRSA infection nor to guide or monitor treatment for MRSA infections. Performed at [facility], [place] [address], [place], [place] [address]      Studies: No results found.    Flora Lipps, MD  [facility] 06/24/2019

## 2019-06-25 ENCOUNTER — Ambulatory Visit
Admission: RE | Admit: 2019-06-25 | Discharge: 2019-06-25 | Disposition: A | Discharge status: Home / Self Care | Payer: No Typology Code available for payment source | Source: Ambulatory Visit | Attending: Radiation Oncology | Admitting: Radiation Oncology

## 2019-06-25 DIAGNOSIS — I959 Hypotension, unspecified: Secondary | ICD-10-CM

## 2019-06-25 LAB — BASIC METABOLIC PANEL
Anion gap: 7 (ref 5–15)
BUN: 15 mg/dL (ref 8–23)
CO2: 28 mmol/L (ref 22–32)
Calcium: 7.6 mg/dL — ABNORMAL LOW (ref 8.9–10.3)
Chloride: 104 mmol/L (ref 98–111)
Creatinine, Ser: 0.84 mg/dL (ref 0.61–1.24)
GFR calc Af Amer: >60 mL/min (ref >60)
GFR calc non Af Amer: >60 mL/min (ref >60)
Glucose, Bld: 86 mg/dL (ref 70–99)
Potassium: 4.2 mmol/L (ref 3.5–5.1)
Sodium: 139 mmol/L (ref 135–145)

## 2019-06-25 LAB — CBC
HCT: 27.7 % — ABNORMAL LOW (ref 39.0–52.0)
Hemoglobin: 8.3 g/dL — ABNORMAL LOW (ref 13.0–17.0)
MCH: 25.9 pg — ABNORMAL LOW (ref 26.0–34.0)
MCHC: 30 g/dL (ref 30.0–36.0)
MCV: 86.6 fL (ref 80.0–100.0)
Platelets: 246 10*3/uL (ref 150–400)
RBC: 3.2 MIL/uL — ABNORMAL LOW (ref 4.22–5.81)
RDW: 18.6 % — ABNORMAL HIGH (ref 11.5–15.5)
WBC: 6.1 10*3/uL (ref 4.0–10.5)
nRBC: 0 % (ref 0.0–0.2)

## 2019-06-25 LAB — COOXEMETRY PANEL
Carboxyhemoglobin: 1.1 % (ref 0.5–1.5)
Carboxyhemoglobin: 1.6 % — ABNORMAL HIGH (ref 0.5–1.5)
Methemoglobin: 0.8 % (ref 0.0–1.5)
Methemoglobin: 0.6 % (ref 0.0–1.5)
O2 Saturation: 49.2 %
O2 Saturation: 44.8 %
Total hemoglobin: 8.9 g/dL — ABNORMAL LOW (ref 12.0–16.0)
Total hemoglobin: 9 g/dL — ABNORMAL LOW (ref 12.0–16.0)

## 2019-06-25 LAB — MAGNESIUM: Magnesium: 1.9 mg/dL (ref 1.7–2.4)

## 2019-06-25 MED ORDER — MILRINONE LACTATE IN DEXTROSE 20-5 MG/100ML-% IV SOLN
0.1250 ug/kg/min | INTRAVENOUS | Status: DC
  Administered 2019-06-25 – 2019-06-26 (×3): 0.125 ug/kg/min via INTRAVENOUS
  Filled 2019-06-25 (×2): qty 100

## 2019-06-25 NOTE — Progress Notes (Signed)
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  During hospitalization, patient developed the atrial flutter with rapid ventricular response and cardiology was consulted patient was then transferred to the stepdown unit for closer monitoring and treatment.  He was started on amiodarone drip and underwent cardioversion.  He briefly converted but was again in flutter.  Patient was also initiated on radiation treatment for B-cell lymphoma.  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  Cardiology on board.  On amiodarone 200 mg twice daily.  Plan is repeat cardioversion on 06/25/2019.  Continue  Brilinta and Xarelto.  Discontinued QT prolonging drugs including Zofran and Zoloft.  2D echocardiogram on 06/24/2019 with ejection fraction of 35 to 40% with severe MR.     Pancytopenia secondary to chemotherapy, symptomatic anemia.   Status post 3 units of packed RBC this admission.  Hemoglobin at 8.3 today from 8.4, no evidence of bleeding.  WBC has normalized at 6.1.  Platelets at 246.Marland Kitchen  On Brilinta and Xarelto.  No evidence of active bleeding .  If downtrending hemoglobin, might need blood transfusion.  Oropharyngeal candidiasis secondary to chemotherapy On Diflucan, nystatin.  Currently on regular consistency diet.  Discontinue Diflucan today completed 7-day course  Hypokalemia, hypophosphatemia Potassium level is improved to 4.2 today.  On p.o. potassium 3 times a day.  As of 1.9.  Colonic ileus  Improved.  Tolerating oral diet.  Diffuse B-cell lymphoma Patient will need continued outpatient follow-up.  Patient was started on R-CHOP on Jun 06, 2019.  Currently undergoing radiation treatment during hospitalization.  Hypotension On midodrine.  Likely secondary to systolic cardiomyopathy with atrial fibrillation..  On decreased dose of metoprolol IV as needed for tachycardia, continue midodrine.  No fever or leukocytosis.  No obvious source of infection at this time .  Urinalysis with no evidence of infection.  Chest x-ray noted with effusion/atelectasis.   Seizure disorder  Continue Lamictal and Keppra  VTE Prophylaxis: Xarelto  Code Status: Full code  Family Communication:  None today.  Spoke with the patient's wife yesterday.   Status is: Inpatient  Remains inpatient appropriate because: status post cardio version-plan for repeat cardioversion, stepdown unit care, patient with multiple comorbidities including lymphoma needing radiation.   Dispo: The patient is from: Home              Anticipated d/c is to: Home with home health              Anticipated d/c date is: 2 to 3 days.  Patient currently is not medically stable to d/c.  Consultants:  Oncology  Cardiology  Procedures:  PRBC transfusion  DC cardioversion on  06/22/2019  Antibiotics/antifungals:  . Fluconazole 5/30>6/7  Anti-infectives (From admission, onward)   Start     Dose/Rate Route Frequency Ordered Stop   06/17/19 1000  fluconazole (DIFLUCAN) tablet 100 mg     100 mg Oral Daily 06/16/19 0915     06/16/19 1000  fluconazole (DIFLUCAN) IVPB 200 mg     200 mg 100 mL/hr over 60 Minutes Intravenous  Once 06/16/19 0912 06/16/19 1121     Subjective: Today, patient was seen and examined at bedside.  Denies any chest pain,  shortness of breath, fever or chills.  Objective: Vitals:   06/25/19 0718 06/25/19 0800  BP: (!) 87/61   Pulse:    Resp: 16   Temp:  97.7 F (36.5 C)  SpO2:      Intake/Output Summary (Last 24 hours) at 06/25/2019 0916 Last data filed at 06/25/2019 0600 Gross per 24 hour  Intake 240 ml  Output 300 ml  Net -60 ml   Filed Weights   06/23/19 0410 06/24/19 0500 06/25/19 0500  Weight: 81 kg 79.4 kg 80.4 kg   Body mass index is 24.04 kg/m.   Physical Exam:  General:  thinly built, not in obvious distress, alert awake oriented.  Chronically ill. HENT:   Mild pallor  Chest:  Clear breath sounds.  Diminished breath sounds bilaterally.  CVS: S1 &S2 heard.  Systolic murmur.  Irregular rhythm. Abdomen: Soft, nontender, nondistended.  Bowel sounds are heard.   Extremities: No cyanosis, clubbing but with peripheral pitting edema,  Psych: Alert, awake and oriented, normal mood CNS:  No cranial nerve deficits.  Power equal in all extremities.   Skin: Warm and dry.  Petechiae.   Data Review: I have personally reviewed the following laboratory data and studies,  CBC: Recent Labs  Lab 06/19/19 0528 06/19/19 0528 06/21/19 0532 06/22/19 0500 06/23/19 0500 06/24/19 0730 06/25/19 0500  WBC 3.5*   < > 4.0 4.1 5.0 4.3 6.1  NEUTROABS 2.8  --   --   --   --   --   --   HGB 9.0*   < > 9.0* 7.9* 9.1* 8.4* 8.3*  HCT 29.1*   < > 30.0* 25.7* 30.1* 28.1* 27.7*  MCV 83.4   < > 85.0 85.1 85.8 86.5 86.6  PLT 99*   < > 158  167 217 208 246   < > = values in this interval not displayed.   Basic Metabolic Panel: Recent Labs  Lab 06/19/19 0528 06/19/19 0528 06/20/19 0500 06/20/19 0500 06/21/19 0532 06/22/19 0500 06/23/19 0703 06/24/19 0730 06/25/19 0500  NA 135   < > 137   < > 137 137 139 140 139  K 4.3   < > 3.7   < > 3.9 3.4* 3.0* 3.5 4.2  CL 100   < > 102   < > 101 102 102 102 104  CO2 24   < > 24   < > 25 26 25 27 28   GLUCOSE 123*   < > 107*   < > 155* 131* 81 80 86  BUN 17   < > 20   < > 19 19 16 15 15   CREATININE 0.98   < > 0.98   < > 1.06 1.06 0.97 0.90 0.84  CALCIUM 7.7*   < > 7.4*   < > 7.7* 7.4* 7.2* 7.3*  7.6*  MG 1.9   < > 1.7   < > 1.8 1.7 1.8 1.9 1.9  PHOS 3.6  --  3.6  --  3.0 2.4* 3.2  --   --    < > = values in this interval not displayed.   Liver Function Tests: Recent Labs  Lab 06/20/19 0500 06/21/19 0532 06/22/19 0500  AST 24 36 40  ALT 29 32 35  ALKPHOS 62 66 61  BILITOT 0.8 0.6 0.4  PROT 4.5* 4.7* 4.4*  ALBUMIN 2.0* 2.0* 1.9*   No results for input(s): LIPASE, AMYLASE in the last 168 hours. No results for input(s): AMMONIA in the last 168 hours. Cardiac Enzymes: No results for input(s): CKTOTAL, CKMB, CKMBINDEX, TROPONINI in the last 168 hours. BNP (last 3 results) No results for input(s): BNP in the last 8760 hours.  ProBNP (last 3 results) No results for input(s): PROBNP in the last 8760 hours.  CBG: No results for input(s): GLUCAP in the last 168 hours. Recent Results (from the past 240 hour(s))  SARS Coronavirus 2 by RT PCR (hospital order, performed in [facility] hospital lab) Nasopharyngeal Nasopharyngeal Swab     Status: None   Collection Time: 06/15/19  4:47 PM   Specimen: Nasopharyngeal Swab  Result Value Ref Range Status   SARS Coronavirus 2 NEGATIVE NEGATIVE Final    Comment: (NOTE) SARS-CoV-2 target nucleic acids are NOT DETECTED. The SARS-CoV-2 RNA is generally detectable in upper and lower respiratory specimens during the acute phase of  infection. The lowest concentration of SARS-CoV-2 viral copies this assay can detect is 250 copies / mL. A negative result does not preclude SARS-CoV-2 infection and should not be used as the sole basis for treatment or other patient management decisions.  A negative result may occur with improper specimen collection / handling, submission of specimen other than nasopharyngeal swab, presence of viral mutation(s) within the areas targeted by this assay, and inadequate number of viral copies (<250 copies / mL). A negative result must be combined with clinical observations, patient history, and epidemiological information. Fact Sheet for Patients:   StrictlyIdeas.no Fact Sheet for Healthcare Providers: BankingDealers.co.za This test is not yet approved or cleared  by the [place] FDA and has been authorized for detection and/or diagnosis of SARS-CoV-2 by FDA under an Emergency Use Authorization (EUA).  This EUA will remain in effect (meaning this test can be used) for the duration of the COVID-19 declaration under Section 564(b)(1) of the Act, 21 U.S.C. section 360bbb-3(b)(1), unless the authorization is terminated or revoked sooner. Performed at [facility], [place] [address], [place], [place] [address]   MRSA PCR Screening     Status: None   Collection Time: 06/18/19  5:43 PM   Specimen: Nasal Mucosa; Nasopharyngeal  Result Value Ref Range Status   MRSA by PCR NEGATIVE NEGATIVE Final    Comment:        The GeneXpert MRSA Assay (FDA approved for NASAL specimens only), is one component of a comprehensive MRSA colonization surveillance program. It is not intended to diagnose MRSA infection nor to guide or monitor treatment for MRSA infections. Performed at [facility], [place] [address] [place], [place] [address]      Studies: DG CHEST PORT 1 VIEW  Result Date: 06/24/2019 CLINICAL DATA:   Hypotension. EXAM: PORTABLE CHEST 1 VIEW COMPARISON:  Jun 18, 2019 FINDINGS: A right-sided PICC line is seen with its distal tip noted at the junction of the superior vena  cava and right atrium. Mild diffusely increased lung markings are seen bilaterally. Moderate severity atelectasis and/or infiltrate is seen within the retrocardiac region of the left lung base. Small bilateral pleural effusions are seen, right greater than left. No pneumothorax is identified. The cardiac silhouette is mildly enlarged. The visualized skeletal structures are unremarkable. IMPRESSION: 1. Right-sided PICC line in satisfactory position. 2. Moderate severity atelectasis and/or infiltrate within the retrocardiac region of the left lung base. 3. Small bilateral pleural effusions, right greater than left. Electronically Signed   By: Virgina Norfolk M.D.   On: 06/24/2019 15:50   ECHOCARDIOGRAM COMPLETE  Result Date: 06/24/2019    ECHOCARDIOGRAM REPORT   Patient Name:   AUGUSTINO SAVASTANO Date of Exam: 06/24/2019 Medical Rec #:  [id number]            Height:       72.0 in Accession #:    [id number]           Weight:       175.0 lb Date of Birth:  21-Aug-1955            BSA:          2.013 m Patient Age:    59 years             BP:           90/64 mmHg Patient Gender: M                    HR:           98 bpm. Exam Location:  Inpatient Procedure: 2D Echo, Cardiac Doppler and Color Doppler Indications:    Pericardial Effusion  History:        Patient has prior history of Echocardiogram examinations, most                 recent 05/14/2019. Signs/Symptoms:Hypotension.  Sonographer:    Merrie Roof RDCS Referring Phys: [place]  1. Left ventricular ejection fraction, by estimation, is 35 to 40%. The left ventricle has moderately decreased function. The left ventricle demonstrates global hypokinesis. The left ventricular internal cavity size was mildly dilated. Left ventricular diastolic parameters are indeterminate.  2.  Right ventricular systolic function is normal. The right ventricular size is normal. Tricuspid regurgitation signal is inadequate for assessing PA pressure.  3. Left atrial size was mildly dilated.  4. Right atrial size was mildly dilated.  5. The mitral valve is normal in structure. Severe mitral valve regurgitation from teathering of posterior MV leaflet. No evidence of mitral stenosis.  6. The aortic valve is tricuspid. Aortic valve regurgitation is not visualized. Mild to moderate aortic valve sclerosis/calcification is present, without any evidence of aortic stenosis.  7. The inferior vena cava is normal in size with greater than 50% respiratory variability, suggesting right atrial pressure of 3 mmHg.  8. Large pleural effusion in the left lateral region. No pericardial effusion. FINDINGS  Left Ventricle: Left ventricular ejection fraction, by estimation, is 35 to 40%. The left ventricle has moderately decreased function. The left ventricle demonstrates global hypokinesis. The left ventricular internal cavity size was mildly dilated. There is no left ventricular hypertrophy. Left ventricular diastolic parameters are indeterminate. Right Ventricle: The right ventricular size is normal. No increase in right ventricular wall thickness. Right ventricular systolic function is normal. Tricuspid regurgitation signal is inadequate for assessing PA pressure. Left Atrium: Left atrial size was mildly dilated. Right Atrium: Right atrial size was mildly  dilated. Pericardium: There is no evidence of pericardial effusion. Mitral Valve: The mitral valve is normal in structure. There is mild thickening of the mitral valve leaflet(s). Normal mobility of the mitral valve leaflets. Severe mitral valve regurgitation, with eccentric posteriorly directed jet. No evidence of mitral valve stenosis. Tricuspid Valve: The tricuspid valve is normal in structure. Tricuspid valve regurgitation is mild . No evidence of tricuspid stenosis.  Aortic Valve: The aortic valve is tricuspid. Aortic valve regurgitation is not visualized. Mild to moderate aortic valve sclerosis/calcification is present, without any evidence of aortic stenosis. Pulmonic Valve: The pulmonic valve was normal in structure. Pulmonic valve regurgitation is trivial. No evidence of pulmonic stenosis. Aorta: The aortic root is normal in size and structure. Venous: The inferior vena cava is normal in size with greater than 50% respiratory variability, suggesting right atrial pressure of 3 mmHg. IAS/Shunts: No atrial level shunt detected by color flow Doppler. Additional Comments: There is a large pleural effusion in the left lateral region.  LEFT VENTRICLE PLAX 2D LVIDd:         5.60 cm      Diastology LVIDs:         4.20 cm      LV e' lateral: 11.10 cm/s LV PW:         0.80 cm      LV e' medial:  9.57 cm/s LV IVS:        1.90 cm LVOT diam:     2.00 cm LV SV:         46 LV SV Index:   23 LVOT Area:     3.14 cm  LV Volumes (MOD) LV vol d, MOD A2C: 199.0 ml LV vol d, MOD A4C: 179.0 ml LV vol s, MOD A2C: 126.0 ml LV vol s, MOD A4C: 78.5 ml LV SV MOD A2C:     73.0 ml LV SV MOD A4C:     179.0 ml LV SV MOD BP:      86.9 ml RIGHT VENTRICLE RV Basal diam:  3.40 cm RV S prime:     9.46 cm/s TAPSE (M-mode): 0.9 cm LEFT ATRIUM             Index       RIGHT ATRIUM           Index LA diam:        4.90 cm 2.43 cm/m  RA Area:     23.50 cm LA Vol (A2C):   70.0 ml 34.77 ml/m RA Volume:   79.10 ml  39.29 ml/m LA Vol (A4C):   78.3 ml 38.89 ml/m LA Biplane Vol: 82.1 ml 40.78 ml/m  AORTIC VALVE LVOT Vmax:   97.50 cm/s LVOT Vmean:  58.300 cm/s LVOT VTI:    0.147 m  AORTA Ao Root diam: 3.10 cm MR Peak grad:    94.7 mmHg MR Mean grad:    60.0 mmHg   SHUNTS MR Vmax:         486.50 cm/s Systemic VTI:  0.15 m MR Vmean:        362.0 cm/s  Systemic Diam: 2.00 cm MR PISA:         5.09 cm MR PISA Eff ROA: 32 mm MR PISA Radius:  0.90 cm [name] MD Electronically signed by [name] MD Signature  Date/Time: 06/24/2019/1:10:34 PM    Final       Flora Lipps, MD  [facility] 06/25/2019

## 2019-06-25 NOTE — TOC Progression Note (Signed)
Transition of Care [facility]) - Progression Note    Patient Details  Name: Tony Saunders MRN: [id number] Date of Birth: 17-Jan-1956  Transition of Care [facility]) CM/SW Contact  Leeroy Cha, RN Phone Number: 06/25/2019, 9:59 AM  Clinical Narrative:    For DCCV at [facility] today.   Expected Discharge Plan: Home/Self Care Barriers to Discharge: Continued Medical Work up  Expected Discharge Plan and Services Expected Discharge Plan: Home/Self Care   Discharge Planning Services: CM Consult   Living arrangements for the past 2 months: Single Family Home                                       Social Determinants of Health (SDOH) Interventions    Readmission Risk Interventions No flowsheet data found.

## 2019-06-25 NOTE — Progress Notes (Signed)
Progress Note  Patient Name: Tony Saunders Date of Encounter: 06/25/2019  Primary Cardiologist: Glenetta Hew, MD   Subjective   S/P DCCV to NSR but converted back to atrial flutter with CVR.  Continues to have low BP at 87/15mmHg.  Remains in atrial flutter with variable block but HR controlled in the 80's  Inpatient Medications    Scheduled Meds: . allopurinol  300 mg Oral Daily  . amiodarone  200 mg Oral BID  . calcium carbonate  1 tablet Oral TID  . Chlorhexidine Gluconate Cloth  6 each Topical Daily  . feeding supplement  1 Container Oral Q breakfast  . feeding supplement (ENSURE ENLIVE)  237 mL Oral BID BM  . lamoTRIgine  100 mg Oral BID  . levETIRAcetam  750 mg Oral BID  . magnesium oxide  400 mg Oral BID  . mouth rinse  15 mL Mouth Rinse BID  . midodrine  2.5 mg Oral TID WC  . mirtazapine  15 mg Oral QHS  . nystatin  5 mL Oral QID  . potassium chloride  40 mEq Oral TID  . rivaroxaban  20 mg Oral Q supper  . rosuvastatin  5 mg Oral QHS  . sodium chloride flush  3 mL Intravenous Q12H  . sodium chloride flush  3 mL Intravenous Q12H  . ticagrelor  90 mg Oral BID   Continuous Infusions: . sodium chloride     PRN Meds: sodium chloride, acetaminophen **OR** acetaminophen, metoprolol tartrate, morphine injection, nitroGLYCERIN, pantoprazole, sodium chloride flush, sodium chloride flush   Vital Signs    Vitals:   06/25/19 0500 06/25/19 0600 06/25/19 0718 06/25/19 0800  BP: 93/61 (!) 89/63 (!) 87/61   Pulse: (!) 102 84    Resp: 20 (!) 28 16   Temp:    97.7 F (36.5 C)  TempSrc:    Oral  SpO2: 99% 99%    Weight: 80.4 kg     Height:        Intake/Output Summary (Last 24 hours) at 06/25/2019 1127 Last data filed at 06/25/2019 0600 Gross per 24 hour  Intake 240 ml  Output 300 ml  Net -60 ml   Last 3 Weights 06/25/2019 06/24/2019 06/23/2019  Weight (lbs) 177 lb 4 oz 175 lb 0.7 oz 178 lb 9.2 oz  Weight (kg) 80.4 kg 79.4 kg 81 kg      Telemetry    Aflutter  with HR in the 80's-90's- Personally Reviewed  ECG    Atrial flutter with variable AV block and QTx 458ms  - Personally Reviewed  Physical Exam   GEN: Well nourished, well developed in no acute distress HEENT: Normal NECK: No JVD; No carotid bruits LYMPHATICS: No lymphadenopathy CARDIAC:irregularly irregular, no murmurs, rubs, gallops RESPIRATORY:  Clear to auscultation without rales, wheezing or rhonchi  ABDOMEN: Soft, non-tender, non-distended MUSCULOSKELETAL:  No edema; No deformity  SKIN: Warm and dry NEUROLOGIC:  Alert and oriented x 3 PSYCHIATRIC:  Normal affect   Labs    High Sensitivity Troponin:   No results for input(s): TROPONINIHS in the last 720 hours.    Chemistry Recent Labs  Lab 06/20/19 0500 06/20/19 0500 06/21/19 0532 06/21/19 0532 06/22/19 0500 06/22/19 0500 06/23/19 0703 06/24/19 0730 06/25/19 0500  NA 137   < > 137   < > 137   < > 139 140 139  K 3.7   < > 3.9   < > 3.4*   < > 3.0* 3.5 4.2  CL 102   < >  101   < > 102   < > 102 102 104  CO2 24   < > 25   < > 26   < > 25 27 28   GLUCOSE 107*   < > 155*   < > 131*   < > 81 80 86  BUN 20   < > 19   < > 19   < > 16 15 15   CREATININE 0.98   < > 1.06   < > 1.06   < > 0.97 0.90 0.84  CALCIUM 7.4*   < > 7.7*   < > 7.4*   < > 7.2* 7.3* 7.6*  PROT 4.5*  --  4.7*  --  4.4*  --   --   --   --   ALBUMIN 2.0*  --  2.0*  --  1.9*  --   --   --   --   AST 24  --  36  --  40  --   --   --   --   ALT 29  --  32  --  35  --   --   --   --   ALKPHOS 62  --  66  --  61  --   --   --   --   BILITOT 0.8  --  0.6  --  0.4  --   --   --   --   GFRNONAA >60   < > >60   < > >60   < > >60 >60 >60  GFRAA >60   < > >60   < > >60   < > >60 >60 >60  ANIONGAP 11   < > 11   < > 9   < > 12 11 7    < > = values in this interval not displayed.     Hematology Recent Labs  Lab 06/23/19 0500 06/24/19 0730 06/25/19 0500  WBC 5.0 4.3 6.1  RBC 3.51* 3.25* 3.20*  HGB 9.1* 8.4* 8.3*  HCT 30.1* 28.1* 27.7*  MCV 85.8 86.5 86.6    MCH 25.9* 25.8* 25.9*  MCHC 30.2 29.9* 30.0  RDW 18.6* 18.4* 18.6*  PLT 217 208 246    BNPNo results for input(s): BNP, PROBNP in the last 168 hours.   Lactic Acid, Venous    Component Value Date/Time   LATICACIDVEN 1.8 06/19/2019 1503   Magnesium  Date Value Ref Range Status  06/25/2019 1.9 1.7 - 2.4 mg/dL Final    Comment:    Performed at [facility], [place] [address], [place], [place] [address]  06/24/2019 1.9 1.7 - 2.4 mg/dL Final    Comment:    Performed at [facility], [place] [address] [place], [place] [address]  06/23/2019 1.8 1.7 - 2.4 mg/dL Final    Comment:    Performed at [facility], [place] [address], [place], [place] [address]   Phosphorus  Date Value Ref Range Status  06/23/2019 3.2 2.5 - 4.6 mg/dL Final    Comment:    Performed at [facility], [place] [address] [place], [place] [address]      Radiology    DG CHEST PORT 1 VIEW  Result Date: 06/24/2019 CLINICAL DATA:  Hypotension. EXAM: PORTABLE CHEST 1 VIEW COMPARISON:  Jun 18, 2019 FINDINGS: A right-sided PICC line is seen with its distal tip noted at the junction of the superior vena cava and right atrium.  Mild diffusely increased lung markings are seen bilaterally. Moderate severity atelectasis and/or infiltrate is seen within the retrocardiac region of the left lung base. Small bilateral pleural effusions are seen, right greater than left. No pneumothorax is identified. The cardiac silhouette is mildly enlarged. The visualized skeletal structures are unremarkable. IMPRESSION: 1. Right-sided PICC line in satisfactory position. 2. Moderate severity atelectasis and/or infiltrate within the retrocardiac region of the left lung base. 3. Small bilateral pleural effusions, right greater than left. Electronically Signed   By: Virgina Norfolk M.D.   On: 06/24/2019 15:50   ECHOCARDIOGRAM COMPLETE  Result Date: 06/24/2019    ECHOCARDIOGRAM  REPORT   Patient Name:   Tony Saunders Date of Exam: 06/24/2019 Medical Rec #:  [id number]            Height:       72.0 in Accession #:    [id number]           Weight:       175.0 lb Date of Birth:  11-19-1955            BSA:          2.013 m Patient Age:    64 years             BP:           90/64 mmHg Patient Gender: M                    HR:           98 bpm. Exam Location:  Inpatient Procedure: 2D Echo, Cardiac Doppler and Color Doppler Indications:    Pericardial Effusion  History:        Patient has prior history of Echocardiogram examinations, most                 recent 05/14/2019. Signs/Symptoms:Hypotension.  Sonographer:    Merrie Roof RDCS Referring Phys: [place]  1. Left ventricular ejection fraction, by estimation, is 35 to 40%. The left ventricle has moderately decreased function. The left ventricle demonstrates global hypokinesis. The left ventricular internal cavity size was mildly dilated. Left ventricular diastolic parameters are indeterminate.  2. Right ventricular systolic function is normal. The right ventricular size is normal. Tricuspid regurgitation signal is inadequate for assessing PA pressure.  3. Left atrial size was mildly dilated.  4. Right atrial size was mildly dilated.  5. The mitral valve is normal in structure. Severe mitral valve regurgitation from teathering of posterior MV leaflet. No evidence of mitral stenosis.  6. The aortic valve is tricuspid. Aortic valve regurgitation is not visualized. Mild to moderate aortic valve sclerosis/calcification is present, without any evidence of aortic stenosis.  7. The inferior vena cava is normal in size with greater than 50% respiratory variability, suggesting right atrial pressure of 3 mmHg.  8. Large pleural effusion in the left lateral region. No pericardial effusion. FINDINGS  Left Ventricle: Left ventricular ejection fraction, by estimation, is 35 to 40%. The left ventricle has moderately decreased function.  The left ventricle demonstrates global hypokinesis. The left ventricular internal cavity size was mildly dilated. There is no left ventricular hypertrophy. Left ventricular diastolic parameters are indeterminate. Right Ventricle: The right ventricular size is normal. No increase in right ventricular wall thickness. Right ventricular systolic function is normal. Tricuspid regurgitation signal is inadequate for assessing PA pressure. Left Atrium: Left atrial size was mildly dilated. Right Atrium: Right atrial size was mildly dilated. Pericardium: There  is no evidence of pericardial effusion. Mitral Valve: The mitral valve is normal in structure. There is mild thickening of the mitral valve leaflet(s). Normal mobility of the mitral valve leaflets. Severe mitral valve regurgitation, with eccentric posteriorly directed jet. No evidence of mitral valve stenosis. Tricuspid Valve: The tricuspid valve is normal in structure. Tricuspid valve regurgitation is mild . No evidence of tricuspid stenosis. Aortic Valve: The aortic valve is tricuspid. Aortic valve regurgitation is not visualized. Mild to moderate aortic valve sclerosis/calcification is present, without any evidence of aortic stenosis. Pulmonic Valve: The pulmonic valve was normal in structure. Pulmonic valve regurgitation is trivial. No evidence of pulmonic stenosis. Aorta: The aortic root is normal in size and structure. Venous: The inferior vena cava is normal in size with greater than 50% respiratory variability, suggesting right atrial pressure of 3 mmHg. IAS/Shunts: No atrial level shunt detected by color flow Doppler. Additional Comments: There is a large pleural effusion in the left lateral region.  LEFT VENTRICLE PLAX 2D LVIDd:         5.60 cm      Diastology LVIDs:         4.20 cm      LV e' lateral: 11.10 cm/s LV PW:         0.80 cm      LV e' medial:  9.57 cm/s LV IVS:        1.90 cm LVOT diam:     2.00 cm LV SV:         46 LV SV Index:   23 LVOT Area:      3.14 cm  LV Volumes (MOD) LV vol d, MOD A2C: 199.0 ml LV vol d, MOD A4C: 179.0 ml LV vol s, MOD A2C: 126.0 ml LV vol s, MOD A4C: 78.5 ml LV SV MOD A2C:     73.0 ml LV SV MOD A4C:     179.0 ml LV SV MOD BP:      86.9 ml RIGHT VENTRICLE RV Basal diam:  3.40 cm RV S prime:     9.46 cm/s TAPSE (M-mode): 0.9 cm LEFT ATRIUM             Index       RIGHT ATRIUM           Index LA diam:        4.90 cm 2.43 cm/m  RA Area:     23.50 cm LA Vol (A2C):   70.0 ml 34.77 ml/m RA Volume:   79.10 ml  39.29 ml/m LA Vol (A4C):   78.3 ml 38.89 ml/m LA Biplane Vol: 82.1 ml 40.78 ml/m  AORTIC VALVE LVOT Vmax:   97.50 cm/s LVOT Vmean:  58.300 cm/s LVOT VTI:    0.147 m  AORTA Ao Root diam: 3.10 cm MR Peak grad:    94.7 mmHg MR Mean grad:    60.0 mmHg   SHUNTS MR Vmax:         486.50 cm/s Systemic VTI:  0.15 m MR Vmean:        362.0 cm/s  Systemic Diam: 2.00 cm MR PISA:         5.09 cm MR PISA Eff ROA: 32 mm MR PISA Radius:  0.90 cm [name] MD Electronically signed by [name] MD Signature Date/Time: 06/24/2019/1:10:34 PM    Final     Cardiac Studies   Echocardiogram 05/14/2019: Impressions: 1. Compared to echo in March 2021, no significant change in LVEF or MR.  Pt  is now in atrial fibrillation.  2. LVEF is severely depressed with severe hypokinesis/akinesis of the  base/mid inferior, inferosepal, infer walls and apical walls; hypokinesis  elsewhere . Left ventricular ejection fraction, by estimation, is 30%. The  left ventricle has severely  decreased function. The left ventricular internal cavity size was mildly  dilated. There is mild left ventricular hypertrophy. Left ventricular  diastolic parameters are indeterminate.  3. Right ventricular systolic function is normal. The right ventricular  size is normal. There is normal pulmonary artery systolic pressure.  4. Left atrial size was mildly dilated.  5. MR is directed posteriorly into LA, likely due to tethering of  posterior leaflet. Severe  mitral valve regurgitation.  6. The aortic valve is tricuspid. Aortic valve regurgitation is not  visualized. Mild aortic valve sclerosis is present, with no evidence of  aortic valve stenosis.  7. The inferior vena cava is normal in size with greater than 50%  respiratory variability, suggesting right atrial pressure of 3 mmHg.   Patient Profile     64 y.o. male with a history of CAD with recent STEMI in 03/2019 s/p DES x2 to RCA and DES to RPDA and then staged PCI with DES to LAD, DES x2 to CX, and DES to OM3; chronic systolic CHF/ ischemic cardiomyopathy with EF of 30% on Echo in 04/2019; severe mitral regurgitation, paroxsymal atrial fibrillation/flutter on Amiodarone and Xarelto; COPD; seizure disorder; and diffuse large B-cell lymphoma. Patient admitted on 06/15/2019 for hypotension and pancytopnenia secondary to chemotherapy. Cardiology consulted on 06/18/2019 for further evaluation of atrial fibrillation with RVR at the request of Dr. Louanne Belton.   Assessment & Plan    1.  Atrial Fibrillation/Flutter with RVR -Has been on IV amiodarone and low-dose metoprolol, but often had to hold metoprolol due to low BP.   -s/p successful DCCV last week but went back into atrial fultter with variable block in the 80's - Amio increased to 200mg  BID over the weekend to reload  -continue Xarelto 20 mg qd.  -recommend avoiding all QT prolonging drugs since he needs to be on Amio (he is on zofran, Zoloft)>>QTc today 437ms  2.  Acute on Chronic Combined CHF -EF 30% on TTE 4/26.  -Diuresed with IV lasix.   -he put out 1.5L yesterday and is net neg 1.75L -continues to have significant LE edema, but suspect this is multifactorial with low albumin (1.9) and obstruction from lymphoma contributing  -Co-ox a few days ago was [phone] today (unclear if these are accurate) -now Hypotensive ? Etiology>>co-ox yesterday 70 but now down again today to 11 -suspect hypotension due to cardiogenic etiology and severe MR  and moderate LV dysfunction -would benefit from getting back in NSR to improve cardiac output -BP too low for guideline-directed HF therapy -Hold Lasix due to hypotension -will discuss with AHF service but I think he needs to be transferred to [facility] to be followed by HF service  3.  CAD   - s/p STEMI in 03/2019 treated with DES x2 to RCA and DES to RPDA and then staged PCI with DES to LAD, DES x2 to CX, and DES to OM3. -on Brilinta, no ASA 2nd Xarelto -continue statin -hold BB due to hypotension  4.  Severe Mitral Regurgitation - see echo results above, is likely due to tethering of posterior leaflet - could consider Mitraclip eval once treated for lymphoma  5.  Hypotensive - SBP 80s-100s, on midodrine 2.5 mg tid - likely cardiogenic due to LV dysfunction and  severe MR - MAP ok, asymptomatic  6.  Electrolyte Abnormalities - K+ nadir 2.3 on 05/29, rec'd IV/PO supplement - now on KCl 40 meq TID - K+today 4.2 - Mg nadir 1.6, s/p IV supp  - Mag today 1.9 - continue Mag oxide to 400mg  BID  7.  Recently Diagnosed High-Grade Lymphoma  - extensive retroperitoneal, pelvic, and inguinal lymphadenopathy. There is also involvement of the spleen and near complete infiltration of the right kidney.  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  -Suspicious for infiltrating disease of the right iliac and left pubic bones.  -chemo started 05/19, now on hold -2D echo 6/6 with no pericardial effusion  8.  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  > 50% of time was spent in direct patient care.     For questions or updates, please contact [place] Please consult [email or web address] for contact info under      Signed, [name], MD  06/25/2019, 11:27 AM

## 2019-06-25 NOTE — Progress Notes (Signed)
I have made the patient NPO at [facility] tonight for possible DCCV tomorrow. No room on the schedule for today.

## 2019-06-25 NOTE — Progress Notes (Addendum)
Co-ox repeat remains low at 44.  Likely cardiac etiology for hypotension.  BP improved some.  Discussed with Dr. Haroldine Laws and will start IV milrinone at 0.165mcg.  I have a call into Dr. Alen Blew to see if patient could still get chemo and XRT if we move him to [facility] for cardiac monitory and IV inotropic therapy. Will try to get on board for DCCV tomorrow to see if it improves his CO as hypotension got worse after going back into aflutter. Will make NPO after MN>

## 2019-06-25 NOTE — Progress Notes (Signed)
PT Cancellation Note  Patient Details Name: Tony Saunders MRN: [id number] DOB: 10-13-1955   Cancelled Treatment:    Reason Eval/Treat Not Completed: Medical issues which prohibited therapy Pt going to radiation at 2:15 pm and then states procedure pending at [facility].  (awaiting DCCV)  Will check back as schedule permits.   Jairo Bellew,KATHrine E 06/25/2019, 1:46 PM Jannette Spanner PT, DPT Acute Rehabilitation Services Office: [phone]

## 2019-06-26 ENCOUNTER — Inpatient Hospital Stay: Payer: No Typology Code available for payment source

## 2019-06-26 ENCOUNTER — Inpatient Hospital Stay: Payer: No Typology Code available for payment source | Admitting: Oncology

## 2019-06-26 ENCOUNTER — Encounter (HOSPITAL_COMMUNITY)
Admission: EM | Disposition: A | Discharge status: Home / Self Care | Payer: Self-pay | Source: Home / Self Care | Attending: Internal Medicine

## 2019-06-26 ENCOUNTER — Telehealth: Payer: Self-pay | Admitting: Oncology

## 2019-06-26 ENCOUNTER — Ambulatory Visit
Admission: RE | Admit: 2019-06-26 | Discharge: 2019-06-26 | Disposition: A | Discharge status: Home / Self Care | Payer: No Typology Code available for payment source | Source: Ambulatory Visit | Attending: Radiation Oncology | Admitting: Radiation Oncology

## 2019-06-26 DIAGNOSIS — C8335 Diffuse large B-cell lymphoma, lymph nodes of inguinal region and lower limb: Secondary | ICD-10-CM

## 2019-06-26 LAB — CBC
HCT: 27.9 % — ABNORMAL LOW (ref 39.0–52.0)
Hemoglobin: 8.4 g/dL — ABNORMAL LOW (ref 13.0–17.0)
MCH: 26.6 pg (ref 26.0–34.0)
MCHC: 30.1 g/dL (ref 30.0–36.0)
MCV: 88.3 fL (ref 80.0–100.0)
Platelets: 277 10*3/uL (ref 150–400)
RBC: 3.16 MIL/uL — ABNORMAL LOW (ref 4.22–5.81)
RDW: 18.7 % — ABNORMAL HIGH (ref 11.5–15.5)
WBC: 6.9 10*3/uL (ref 4.0–10.5)
nRBC: 0 % (ref 0.0–0.2)

## 2019-06-26 LAB — BASIC METABOLIC PANEL
Anion gap: 7 (ref 5–15)
BUN: 13 mg/dL (ref 8–23)
CO2: 25 mmol/L (ref 22–32)
Calcium: 7.5 mg/dL — ABNORMAL LOW (ref 8.9–10.3)
Chloride: 105 mmol/L (ref 98–111)
Creatinine, Ser: 0.86 mg/dL (ref 0.61–1.24)
GFR calc Af Amer: >60 mL/min (ref >60)
GFR calc non Af Amer: >60 mL/min (ref >60)
Glucose, Bld: 86 mg/dL (ref 70–99)
Potassium: 4.9 mmol/L (ref 3.5–5.1)
Sodium: 137 mmol/L (ref 135–145)

## 2019-06-26 LAB — COOXEMETRY PANEL
Carboxyhemoglobin: 1.4 % (ref 0.5–1.5)
Carboxyhemoglobin: 2.1 % — ABNORMAL HIGH (ref 0.5–1.5)
Methemoglobin: 0.8 % (ref 0.0–1.5)
Methemoglobin: 0.6 % (ref 0.0–1.5)
O2 Saturation: 69.1 %
O2 Saturation: 71.6 %
Total hemoglobin: 8.4 g/dL — ABNORMAL LOW (ref 12.0–16.0)
Total hemoglobin: 8 g/dL — ABNORMAL LOW (ref 12.0–16.0)

## 2019-06-26 LAB — LACTIC ACID, PLASMA: Lactic Acid, Venous: 1.2 mmol/L (ref 0.5–1.9)

## 2019-06-26 LAB — MAGNESIUM: Magnesium: 2 mg/dL (ref 1.7–2.4)

## 2019-06-26 SURGERY — CARDIOVERSION
Anesthesia: Monitor Anesthesia Care

## 2019-06-26 NOTE — Progress Notes (Signed)
IP PROGRESS NOTE  Subjective:   Events noted.  Tony Saunders feels reasonably fair without any complaints at this time.  His lower extremity edema has improved.  He still has issues with hypotension and atrial flutter.  He has recovered from the first cycle of chemotherapy.   Objective:  Vital signs in last 24 hours: Temp:  [97.3 F (36.3 C)-97.8 F (36.6 C)] 97.8 F (36.6 C) (06/08 0000) Pulse Rate:  [53-136] 96 (06/08 0700) Resp:  [17-34] 19 (06/08 0700) BP: (81-166)/(50-147) 95/50 (06/08 0700) SpO2:  [94 %-100 %] 100 % (06/08 0700) Weight:  [176 lb 5.9 oz (80 kg)] 176 lb 5.9 oz (80 kg) (06/08 0500) Weight change: -14.1 oz (-0.4 kg) Last BM Date: 06/24/19  Intake/Output from previous day: 06/07 0701 - 06/08 0700 In: -  Out: 375 [Urine:375] General: Alert, awake without distress. Head: Normocephalic atraumatic. Mouth: mucous membranes moist, pharynx normal without lesions Eyes: No scleral icterus.  Pupils are equal and round reactive to light. Resp: clear to auscultation bilaterally without rhonchi or wheezes or dullness to percussion. Cardio: Regular GI: soft, non-tender; bowel sounds normal; no masses,  no organomegaly Musculoskeletal: No joint deformity or effusion. Neurological: No motor, sensory deficits.  Intact deep tendon reflexes. Skin: No rashes or lesions.  PICC-without erythema  Lab Results: Recent Labs    06/25/19 0500 06/26/19 0613  WBC 6.1 6.9  HGB 8.3* 8.4*  HCT 27.7* 27.9*  PLT 246 277    BMET Recent Labs    06/25/19 0500 06/26/19 0613  NA 139 137  K 4.2 4.9  CL 104 105  CO2 28 25  GLUCOSE 86 86  BUN 15 13  CREATININE 0.84 0.86  CALCIUM 7.6* 7.5*     ECHOCARDIOGRAM COMPLETE  Result Date: 06/24/2019    ECHOCARDIOGRAM REPORT   Patient Name:   Tony Saunders Date of Exam: 06/24/2019 Medical Rec #:  [id number]            Height:       72.0 in Accession #:    [id number]           Weight:       175.0 lb Date of Birth:  Dec 22, 1955             BSA:          2.013 m Patient Age:    64 years             BP:           90/64 mmHg Patient Gender: M                    HR:           98 bpm. Exam Location:  Inpatient Procedure: 2D Echo, Cardiac Doppler and Color Doppler Indications:    Pericardial Effusion  History:        Patient has prior history of Echocardiogram examinations, most                 recent 05/14/2019. Signs/Symptoms:Hypotension.  Sonographer:    Merrie Roof RDCS Referring Phys: [place]  1. Left ventricular ejection fraction, by estimation, is 35 to 40%. The left ventricle has moderately decreased function. The left ventricle demonstrates global hypokinesis. The left ventricular internal cavity size was mildly dilated. Left ventricular diastolic parameters are indeterminate.  2. Right ventricular systolic function is normal. The right ventricular size is normal. Tricuspid regurgitation signal is inadequate for assessing PA pressure.  3. Left atrial size was mildly dilated.  4. Right atrial size was mildly dilated.  5. The mitral valve is normal in structure. Severe mitral valve regurgitation from teathering of posterior MV leaflet. No evidence of mitral stenosis.  6. The aortic valve is tricuspid. Aortic valve regurgitation is not visualized. Mild to moderate aortic valve sclerosis/calcification is present, without any evidence of aortic stenosis.  7. The inferior vena cava is normal in size with greater than 50% respiratory variability, suggesting right atrial pressure of 3 mmHg.  8. Large pleural effusion in the left lateral region. No pericardial effusion. FINDINGS  Left Ventricle: Left ventricular ejection fraction, by estimation, is 35 to 40%. The left ventricle has moderately decreased function. The left ventricle demonstrates global hypokinesis. The left ventricular internal cavity size was mildly dilated. There is no left ventricular hypertrophy. Left ventricular diastolic parameters are indeterminate. Right  Ventricle: The right ventricular size is normal. No increase in right ventricular wall thickness. Right ventricular systolic function is normal. Tricuspid regurgitation signal is inadequate for assessing PA pressure. Left Atrium: Left atrial size was mildly dilated. Right Atrium: Right atrial size was mildly dilated. Pericardium: There is no evidence of pericardial effusion. Mitral Valve: The mitral valve is normal in structure. There is mild thickening of the mitral valve leaflet(s). Normal mobility of the mitral valve leaflets. Severe mitral valve regurgitation, with eccentric posteriorly directed jet. No evidence of mitral valve stenosis. Tricuspid Valve: The tricuspid valve is normal in structure. Tricuspid valve regurgitation is mild . No evidence of tricuspid stenosis. Aortic Valve: The aortic valve is tricuspid. Aortic valve regurgitation is not visualized. Mild to moderate aortic valve sclerosis/calcification is present, without any evidence of aortic stenosis. Pulmonic Valve: The pulmonic valve was normal in structure. Pulmonic valve regurgitation is trivial. No evidence of pulmonic stenosis. Aorta: The aortic root is normal in size and structure. Venous: The inferior vena cava is normal in size with greater than 50% respiratory variability, suggesting right atrial pressure of 3 mmHg. IAS/Shunts: No atrial level shunt detected by color flow Doppler. Additional Comments: There is a large pleural effusion in the left lateral region.  LEFT VENTRICLE PLAX 2D LVIDd:         5.60 cm      Diastology LVIDs:         4.20 cm      LV e' lateral: 11.10 cm/s LV PW:         0.80 cm      LV e' medial:  9.57 cm/s LV IVS:        1.90 cm LVOT diam:     2.00 cm LV SV:         46 LV SV Index:   23 LVOT Area:     3.14 cm  LV Volumes (MOD) LV vol d, MOD A2C: 199.0 ml LV vol d, MOD A4C: 179.0 ml LV vol s, MOD A2C: 126.0 ml LV vol s, MOD A4C: 78.5 ml LV SV MOD A2C:     73.0 ml LV SV MOD A4C:     179.0 ml LV SV MOD BP:      86.9  ml RIGHT VENTRICLE RV Basal diam:  3.40 cm RV S prime:     9.46 cm/s TAPSE (M-mode): 0.9 cm LEFT ATRIUM             Index       RIGHT ATRIUM           Index LA diam:  4.90 cm 2.43 cm/m  RA Area:     23.50 cm LA Vol (A2C):   70.0 ml 34.77 ml/m RA Volume:   79.10 ml  39.29 ml/m LA Vol (A4C):   78.3 ml 38.89 ml/m LA Biplane Vol: 82.1 ml 40.78 ml/m  AORTIC VALVE LVOT Vmax:   97.50 cm/s LVOT Vmean:  58.300 cm/s LVOT VTI:    0.147 m  AORTA Ao Root diam: 3.10 cm MR Peak grad:    94.7 mmHg MR Mean grad:    60.0 mmHg   SHUNTS MR Vmax:         486.50 cm/s Systemic VTI:  0.15 m MR Vmean:        362.0 cm/s  Systemic Diam: 2.00 cm MR PISA:         5.09 cm MR PISA Eff ROA: 32 mm MR PISA Radius:  0.90 cm [name] MD Electronically signed by [name] MD Signature Date/Time: 06/24/2019/1:10:34 PM    Final     Medications: I have reviewed the patient's current medications.  Assessment/Plan:   64 year old with:  1.  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  He is status post for cycle of CHOP with rituximab and has been reasonably tolerated.  He did develop pancytopenia and hypotension although his counts has recovered at this time.   The natural course of this disease was reviewed and the logistics of administration of the second cycle of chemotherapy was discussed.  Given the fact that he is still hospitalized makes the administration of the second cycle slightly problematic.  My preference is to stabilize his cardiovascular status before proceeding with cycle 2 of chemotherapy.  If he needs to undergo cardioversion and stabilizes his blood pressure before doing so that would be the preferred method.  Delaying chemotherapy 1 to 2 weeks would be reasonable given the fact that he has received the first cycle of chemotherapy which was critical.  We will continue to monitor his progress at [facility] and if he needs to receive systemic chemotherapy, we  we will arrange that to happen.  Generally, second cycle of chemotherapy can be delayed if needed.  This was discussed today with patient in detail.  2.  Atrial flutter: Is under consideration for DC cardioversion.  I have no objections to transfer to [facility] if needed.   3.  Pancytopenia: Resolved at this time he remains anemic which is multifactorial in nature.   4.  Lower extremity edema improved at this time with the treatment of his lymphoma as well as optimizing his cardiac status.   25  minutes were dedicated to this visit.  More than 50% of the encounter was spent face-to-face and dedicated to reviewing laboratory data, iscussing treatment options, and answering questions regarding future plan.       LOS: 11 days   Zola Button 06/26/2019, 7:43 AM

## 2019-06-26 NOTE — Telephone Encounter (Signed)
Scheduled appt per 6/8 sch message - pt wife is aware of appts.

## 2019-06-26 NOTE — Progress Notes (Signed)
[facility] Therapy Treatment Record Phone [phone]   Radiation Therapy was administered to Tony Saunders on: 06/26/2019  9:03 AM and was treatment # 5 out of a planned course of 10 treatments.  Radiation Treatment  1). Beam photons with 6-10 energy  2). Brachytherapy None  3). Stereotactic Radiosurgery None  4). Other Radiation None     Monic Engelmann A Jamesrobert Ohanesian, RT (T)

## 2019-06-26 NOTE — Progress Notes (Signed)
Progress Note  Patient Name: Tony Saunders Date of Encounter: 06/26/2019  Primary Cardiologist: Glenetta Hew, MD   Subjective   S/P DCCV to NSR but converted back to atrial flutter with CVR.  Remains in atrial flutter with variable block but HR controlled in the 80's.  Co-ox low yesterday at 44 and started on IV Milrinone.  BP stable in the 82'X systolic this am.  Inpatient Medications    Scheduled Meds:  allopurinol  300 mg Oral Daily   amiodarone  200 mg Oral BID   calcium carbonate  1 tablet Oral TID   Chlorhexidine Gluconate Cloth  6 each Topical Daily   feeding supplement  1 Container Oral Q breakfast   feeding supplement (ENSURE ENLIVE)  237 mL Oral BID BM   lamoTRIgine  100 mg Oral BID   levETIRAcetam  750 mg Oral BID   magnesium oxide  400 mg Oral BID   mouth rinse  15 mL Mouth Rinse BID   midodrine  2.5 mg Oral TID WC   mirtazapine  15 mg Oral QHS   rivaroxaban  20 mg Oral Q supper   rosuvastatin  5 mg Oral QHS   sodium chloride flush  3 mL Intravenous Q12H   sodium chloride flush  3 mL Intravenous Q12H   ticagrelor  90 mg Oral BID   Continuous Infusions:  sodium chloride     milrinone 0.125 mcg/kg/min (06/25/19 2120)   PRN Meds: sodium chloride, acetaminophen **OR** acetaminophen, metoprolol tartrate, morphine injection, nitroGLYCERIN, pantoprazole, sodium chloride flush, sodium chloride flush   Vital Signs    Vitals:   06/26/19 0500 06/26/19 0700 06/26/19 0800 06/26/19 1000  BP:  (!) 95/50 95/61 (!) 168/135  Pulse:  96    Resp:  19 15 (!) 34  Temp:   (!) 97.4 F (36.3 C)   TempSrc:   Oral   SpO2:  100%    Weight: 80 kg     Height:        Intake/Output Summary (Last 24 hours) at 06/26/2019 1038 Last data filed at 06/25/2019 1830 Gross per 24 hour  Intake --  Output 375 ml  Net -375 ml   Last 3 Weights 06/26/2019 06/25/2019 06/24/2019  Weight (lbs) 176 lb 5.9 oz 177 lb 4 oz 175 lb 0.7 oz  Weight (kg) 80 kg 80.4 kg 79.4 kg       Telemetry    Atrial flutter with CVR- Personally Reviewed  ECG    Atrial flutter with variable AV block and QTx 45ms  - Personally Reviewed  Physical Exam   GEN: Well nourished, well developed in no acute distress HEENT: Normal NECK: No JVD; No carotid bruits LYMPHATICS: No lymphadenopathy CARDIAC:irregularly irregular, no murmurs, rubs, gallops RESPIRATORY:  Clear to auscultation without rales, wheezing or rhonchi  ABDOMEN: Soft, non-tender, non-distended MUSCULOSKELETAL:  Trace LE edema; No deformity  SKIN: Warm and dry NEUROLOGIC:  Alert and oriented x 3 PSYCHIATRIC:  Normal affect    Labs    High Sensitivity Troponin:   No results for input(s): TROPONINIHS in the last 720 hours.    Chemistry Recent Labs  Lab 06/20/19 0500 06/20/19 0500 06/21/19 0532 06/21/19 0532 06/22/19 0500 06/23/19 0703 06/24/19 0730 06/25/19 0500 06/26/19 0613  NA 137   < > 137   < > 137   < > 140 139 137  K 3.7   < > 3.9   < > 3.4*   < > 3.5 4.2 4.9  CL 102   < >  101   < > 102   < > 102 104 105  CO2 24   < > 25   < > 26   < > 27 28 25   GLUCOSE 107*   < > 155*   < > 131*   < > 80 86 86  BUN 20   < > 19   < > 19   < > 15 15 13   CREATININE 0.98   < > 1.06   < > 1.06   < > 0.90 0.84 0.86  CALCIUM 7.4*   < > 7.7*   < > 7.4*   < > 7.3* 7.6* 7.5*  PROT 4.5*  --  4.7*  --  4.4*  --   --   --   --   ALBUMIN 2.0*  --  2.0*  --  1.9*  --   --   --   --   AST 24  --  36  --  40  --   --   --   --   ALT 29  --  32  --  35  --   --   --   --   ALKPHOS 62  --  66  --  61  --   --   --   --   BILITOT 0.8  --  0.6  --  0.4  --   --   --   --   GFRNONAA >60   < > >60   < > >60   < > >60 >60 >60  GFRAA >60   < > >60   < > >60   < > >60 >60 >60  ANIONGAP 11   < > 11   < > 9   < > 11 7 7    < > = values in this interval not displayed.     Hematology Recent Labs  Lab 06/24/19 0730 06/25/19 0500 06/26/19 0613  WBC 4.3 6.1 6.9  RBC 3.25* 3.20* 3.16*  HGB 8.4* 8.3* 8.4*  HCT 28.1* 27.7* 27.9*   MCV 86.5 86.6 88.3  MCH 25.8* 25.9* 26.6  MCHC 29.9* 30.0 30.1  RDW 18.4* 18.6* 18.7*  PLT 208 246 277    BNPNo results for input(s): BNP, PROBNP in the last 168 hours.   Lactic Acid, Venous    Component Value Date/Time   LATICACIDVEN 1.8 06/19/2019 1503   Magnesium  Date Value Ref Range Status  06/26/2019 2.0 1.7 - 2.4 mg/dL Final    Comment:    Performed at [facility], [place] [address] [place], [place] [address]  06/25/2019 1.9 1.7 - 2.4 mg/dL Final    Comment:    Performed at [facility], [place] [address], [place], [place] [address]  06/24/2019 1.9 1.7 - 2.4 mg/dL Final    Comment:    Performed at [facility], [place] [address], [place], [place] [address]   Phosphorus  Date Value Ref Range Status  06/23/2019 3.2 2.5 - 4.6 mg/dL Final    Comment:    Performed at [facility], [place] [address], [place], [place] [address]      Radiology    DG CHEST PORT 1 VIEW  Result Date: 06/24/2019 CLINICAL DATA:  Hypotension. EXAM: PORTABLE CHEST 1 VIEW COMPARISON:  Jun 18, 2019 FINDINGS: A right-sided PICC line is seen with its distal tip noted at the junction of the superior vena cava and right atrium. Mild  diffusely increased lung markings are seen bilaterally. Moderate severity atelectasis and/or infiltrate is seen within the retrocardiac region of the left lung base. Small bilateral pleural effusions are seen, right greater than left. No pneumothorax is identified. The cardiac silhouette is mildly enlarged. The visualized skeletal structures are unremarkable. IMPRESSION: 1. Right-sided PICC line in satisfactory position. 2. Moderate severity atelectasis and/or infiltrate within the retrocardiac region of the left lung base. 3. Small bilateral pleural effusions, right greater than left. Electronically Signed   By: Virgina Norfolk M.D.   On: 06/24/2019 15:50   ECHOCARDIOGRAM COMPLETE  Result Date: 06/24/2019     ECHOCARDIOGRAM REPORT   Patient Name:   KARAN RAMNAUTH Date of Exam: 06/24/2019 Medical Rec #:  [id number]            Height:       72.0 in Accession #:    [id number]           Weight:       175.0 lb Date of Birth:  1955-03-09            BSA:          2.013 m Patient Age:    89 years             BP:           90/64 mmHg Patient Gender: M                    HR:           98 bpm. Exam Location:  Inpatient Procedure: 2D Echo, Cardiac Doppler and Color Doppler Indications:    Pericardial Effusion  History:        Patient has prior history of Echocardiogram examinations, most                 recent 05/14/2019. Signs/Symptoms:Hypotension.  Sonographer:    Merrie Roof RDCS Referring Phys: [place]  1. Left ventricular ejection fraction, by estimation, is 35 to 40%. The left ventricle has moderately decreased function. The left ventricle demonstrates global hypokinesis. The left ventricular internal cavity size was mildly dilated. Left ventricular diastolic parameters are indeterminate.  2. Right ventricular systolic function is normal. The right ventricular size is normal. Tricuspid regurgitation signal is inadequate for assessing PA pressure.  3. Left atrial size was mildly dilated.  4. Right atrial size was mildly dilated.  5. The mitral valve is normal in structure. Severe mitral valve regurgitation from teathering of posterior MV leaflet. No evidence of mitral stenosis.  6. The aortic valve is tricuspid. Aortic valve regurgitation is not visualized. Mild to moderate aortic valve sclerosis/calcification is present, without any evidence of aortic stenosis.  7. The inferior vena cava is normal in size with greater than 50% respiratory variability, suggesting right atrial pressure of 3 mmHg.  8. Large pleural effusion in the left lateral region. No pericardial effusion. FINDINGS  Left Ventricle: Left ventricular ejection fraction, by estimation, is 35 to 40%. The left ventricle has moderately  decreased function. The left ventricle demonstrates global hypokinesis. The left ventricular internal cavity size was mildly dilated. There is no left ventricular hypertrophy. Left ventricular diastolic parameters are indeterminate. Right Ventricle: The right ventricular size is normal. No increase in right ventricular wall thickness. Right ventricular systolic function is normal. Tricuspid regurgitation signal is inadequate for assessing PA pressure. Left Atrium: Left atrial size was mildly dilated. Right Atrium: Right atrial size was mildly dilated. Pericardium: There is  no evidence of pericardial effusion. Mitral Valve: The mitral valve is normal in structure. There is mild thickening of the mitral valve leaflet(s). Normal mobility of the mitral valve leaflets. Severe mitral valve regurgitation, with eccentric posteriorly directed jet. No evidence of mitral valve stenosis. Tricuspid Valve: The tricuspid valve is normal in structure. Tricuspid valve regurgitation is mild . No evidence of tricuspid stenosis. Aortic Valve: The aortic valve is tricuspid. Aortic valve regurgitation is not visualized. Mild to moderate aortic valve sclerosis/calcification is present, without any evidence of aortic stenosis. Pulmonic Valve: The pulmonic valve was normal in structure. Pulmonic valve regurgitation is trivial. No evidence of pulmonic stenosis. Aorta: The aortic root is normal in size and structure. Venous: The inferior vena cava is normal in size with greater than 50% respiratory variability, suggesting right atrial pressure of 3 mmHg. IAS/Shunts: No atrial level shunt detected by color flow Doppler. Additional Comments: There is a large pleural effusion in the left lateral region.  LEFT VENTRICLE PLAX 2D LVIDd:         5.60 cm      Diastology LVIDs:         4.20 cm      LV e' lateral: 11.10 cm/s LV PW:         0.80 cm      LV e' medial:  9.57 cm/s LV IVS:        1.90 cm LVOT diam:     2.00 cm LV SV:         46 LV SV Index:    23 LVOT Area:     3.14 cm  LV Volumes (MOD) LV vol d, MOD A2C: 199.0 ml LV vol d, MOD A4C: 179.0 ml LV vol s, MOD A2C: 126.0 ml LV vol s, MOD A4C: 78.5 ml LV SV MOD A2C:     73.0 ml LV SV MOD A4C:     179.0 ml LV SV MOD BP:      86.9 ml RIGHT VENTRICLE RV Basal diam:  3.40 cm RV S prime:     9.46 cm/s TAPSE (M-mode): 0.9 cm LEFT ATRIUM             Index       RIGHT ATRIUM           Index LA diam:        4.90 cm 2.43 cm/m  RA Area:     23.50 cm LA Vol (A2C):   70.0 ml 34.77 ml/m RA Volume:   79.10 ml  39.29 ml/m LA Vol (A4C):   78.3 ml 38.89 ml/m LA Biplane Vol: 82.1 ml 40.78 ml/m  AORTIC VALVE LVOT Vmax:   97.50 cm/s LVOT Vmean:  58.300 cm/s LVOT VTI:    0.147 m  AORTA Ao Root diam: 3.10 cm MR Peak grad:    94.7 mmHg MR Mean grad:    60.0 mmHg   SHUNTS MR Vmax:         486.50 cm/s Systemic VTI:  0.15 m MR Vmean:        362.0 cm/s  Systemic Diam: 2.00 cm MR PISA:         5.09 cm MR PISA Eff ROA: 32 mm MR PISA Radius:  0.90 cm [name] MD Electronically signed by [name] MD Signature Date/Time: 06/24/2019/1:10:34 PM    Final     Cardiac Studies   Echocardiogram 05/14/2019: Impressions: 1. Compared to echo in March 2021, no significant change in LVEF or MR.  Pt is  now in atrial fibrillation.  2. LVEF is severely depressed with severe hypokinesis/akinesis of the  base/mid inferior, inferosepal, infer walls and apical walls; hypokinesis  elsewhere . Left ventricular ejection fraction, by estimation, is 30%. The  left ventricle has severely  decreased function. The left ventricular internal cavity size was mildly  dilated. There is mild left ventricular hypertrophy. Left ventricular  diastolic parameters are indeterminate.  3. Right ventricular systolic function is normal. The right ventricular  size is normal. There is normal pulmonary artery systolic pressure.  4. Left atrial size was mildly dilated.  5. MR is directed posteriorly into LA, likely due to tethering of  posterior  leaflet. Severe mitral valve regurgitation.  6. The aortic valve is tricuspid. Aortic valve regurgitation is not  visualized. Mild aortic valve sclerosis is present, with no evidence of  aortic valve stenosis.  7. The inferior vena cava is normal in size with greater than 50%  respiratory variability, suggesting right atrial pressure of 3 mmHg.   Patient Profile     65 y.o. male with a history of CAD with recent STEMI in 03/2019 s/p DES x2 to RCA and DES to RPDA and then staged PCI with DES to LAD, DES x2 to CX, and DES to OM3; chronic systolic CHF/ ischemic cardiomyopathy with EF of 30% on Echo in 04/2019; severe mitral regurgitation, paroxsymal atrial fibrillation/flutter on Amiodarone and Xarelto; COPD; seizure disorder; and diffuse large B-cell lymphoma. Patient admitted on 06/15/2019 for hypotension and pancytopnenia secondary to chemotherapy. Cardiology consulted on 06/18/2019 for further evaluation of atrial fibrillation with RVR at the request of Dr. Louanne Belton.   Assessment & Plan    1.  Atrial Fibrillation/Flutter with RVR  -s/p successful DCCV last week but went back into atrial fultter with variable block in the 80's -Amio increased to 200mg  BID over the weekend to reload  -continue Xarelto 20 mg qd.  -recommend avoiding all QT prolonging drugs since he needs to be on Amio (he is on zofran, Zoloft)>>QTc today 494ms  2.  Acute on Chronic Combined CHF -EF 30% on TTE 4/26.  -Diuresed with IV lasix.   -he put out 375cc yesterday ? Accuracy and is net neg 2.2L -minimal LE edema, suspect this is multifactorial with low albumin (1.9) and obstruction from lymphoma contributing  -Co-ox yesterday 44 and IV milrinone started and co-ox this am improved to 69 -BP improved and in the 16'X systolic -would benefit from getting back in NSR to improve cardiac output>>planned for DCCV at [facility] tomorrow am -BP too low for guideline-directed HF therapy -Holding Lasix due to hypotension>>does not appear  volume overloaded on exam -creatinine stable at 0.86 -continue IV milrinone for now until gets cardioverted tomorrow  3.  CAD   - s/p STEMI in 03/2019 treated with DES x2 to RCA and DES to RPDA and then staged PCI with DES to LAD, DES x2 to CX, and DES to OM3. -on Brilinta, no ASA 2nd Xarelto -continue statin -hold BB due to hypotension  4.  Severe Mitral Regurgitation - see echo results above, is likely due to tethering of posterior leaflet - could consider Mitraclip eval once treated for lymphoma  5.  Hypotensive - on midodrine 2.5 mg tid - likely cardiogenic due to LV dysfunction and severe MR - BP improved on low dose IV Milrinone - MAP ok (28mmHg), asymptomatic  6.  Electrolyte Abnormalities - K+ nadir 2.3 on 05/29, rec'd IV/PO supplement - now on KCl 40 meq TID - K+today 4.9 -  Mg nadir 1.6, s/p IV supp  - Mag today 2.0 - continue Mag oxide to 400mg  BID  7.  Recently Diagnosed High-Grade Lymphoma  - extensive retroperitoneal, pelvic, and inguinal lymphadenopathy. There is also involvement of the spleen and near complete infiltration of the right kidney.  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  -Suspicious for infiltrating disease of the right iliac and left pubic bones.  -chemo started 05/19, now on hold -2D echo 6/6 with no pericardial effusion -discussed with Dr. Alen Blew and hopefully will be able to get second cycle of chemo (CHOP with Rituximab) once   hemodynamics stabilize after cardioversion -he is ok with transferring him to [facility] to stabilize cardiac issues and they will follow along.   -will arrange transfer to [facility] to [facility] service and AHF to follow  8.  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  > 50% of time was spent in direct patient care.     For questions or updates, please contact [place] Please consult [email or web address] for contact info under      Signed, [name], MD  06/26/2019, 10:38 AM

## 2019-06-26 NOTE — Progress Notes (Signed)
I have made Tony Saunders NPO for DCCV tomorrow 06/27/19 at [facility]. He is able to have a diet today from cardiology standpoint.

## 2019-06-26 NOTE — Progress Notes (Signed)
[facility] Therapy Treatment Record Phone [phone]   Radiation Therapy was administered to Tony Saunders on: 06/26/2019  9:02 AM (DOS 06/25/2019) and was treatment # 4 out of a planned course of 10 treatments.  Radiation Treatment  1). Beam photons with 6-10 energy  2). Brachytherapy None  3). Stereotactic Radiosurgery None  4). Other Radiation None     [name], RT (T)

## 2019-06-26 NOTE — Progress Notes (Signed)
SBP 80's.Marland Kitchen MAP 59-64.. patient remains drowsy but otherwise asymptomatic. MD made aware via paging system.Marland Kitchen awaiting return call.

## 2019-06-26 NOTE — Anesthesia Preprocedure Evaluation (Addendum)
Anesthesia Evaluation  Patient identified by MRN, date of birth, ID band Patient awake    Reviewed: Allergy & Precautions, H&P , NPO status , Patient's Chart, lab work & pertinent test results  Airway Mallampati: II  TM Distance: >3 FB Neck ROM: Full    Dental no notable dental hx. (+) Teeth Intact, Dental Advisory Given   Pulmonary neg pulmonary ROS, former smoker,    Pulmonary exam normal breath sounds clear to auscultation       Cardiovascular Exercise Tolerance: Good + CAD, + Past MI, + Cardiac Stents and +CHF  + dysrhythmias Atrial Fibrillation  Rhythm:Irregular Rate:Normal     Neuro/Psych Seizures -, Well Controlled,  negative psych ROS   GI/Hepatic negative GI ROS, Neg liver ROS,   Endo/Other  negative endocrine ROS  Renal/GU negative Renal ROS  negative genitourinary   Musculoskeletal   Abdominal   Peds  Hematology negative hematology ROS (+)   Anesthesia Other Findings   Reproductive/Obstetrics negative OB ROS                            Anesthesia Physical Anesthesia Plan  ASA: III  Anesthesia Plan: General   Post-op Pain Management:    Induction: Intravenous  PONV Risk Score and Plan: 2 and Propofol infusion and Treatment may vary due to age or medical condition  Airway Management Planned: Mask  Additional Equipment:   Intra-op Plan:   Post-operative Plan:   Informed Consent: I have reviewed the patients History and Physical, chart, labs and discussed the procedure including the risks, benefits and alternatives for the proposed anesthesia with the patient or authorized representative who has indicated his/her understanding and acceptance.     Dental advisory given  Plan Discussed with: CRNA  Anesthesia Plan Comments:         Anesthesia Quick Evaluation

## 2019-06-26 NOTE — H&P (View-Only) (Signed)
Progress Note  Patient Name: Tony Saunders Date of Encounter: 06/26/2019  Primary Cardiologist: Glenetta Hew, MD   Subjective   S/P DCCV to NSR but converted back to atrial flutter with CVR.  Remains in atrial flutter with variable block but HR controlled in the 80's.  Co-ox low yesterday at 44 and started on IV Milrinone.  BP stable in the 62'X systolic this am.  Inpatient Medications    Scheduled Meds: . allopurinol  300 mg Oral Daily  . amiodarone  200 mg Oral BID  . calcium carbonate  1 tablet Oral TID  . Chlorhexidine Gluconate Cloth  6 each Topical Daily  . feeding supplement  1 Container Oral Q breakfast  . feeding supplement (ENSURE ENLIVE)  237 mL Oral BID BM  . lamoTRIgine  100 mg Oral BID  . levETIRAcetam  750 mg Oral BID  . magnesium oxide  400 mg Oral BID  . mouth rinse  15 mL Mouth Rinse BID  . midodrine  2.5 mg Oral TID WC  . mirtazapine  15 mg Oral QHS  . rivaroxaban  20 mg Oral Q supper  . rosuvastatin  5 mg Oral QHS  . sodium chloride flush  3 mL Intravenous Q12H  . sodium chloride flush  3 mL Intravenous Q12H  . ticagrelor  90 mg Oral BID   Continuous Infusions: . sodium chloride    . milrinone 0.125 mcg/kg/min (06/25/19 2120)   PRN Meds: sodium chloride, acetaminophen **OR** acetaminophen, metoprolol tartrate, morphine injection, nitroGLYCERIN, pantoprazole, sodium chloride flush, sodium chloride flush   Vital Signs    Vitals:   06/26/19 0500 06/26/19 0700 06/26/19 0800 06/26/19 1000  BP:  (!) 95/50 95/61 (!) 168/135  Pulse:  96    Resp:  19 15 (!) 34  Temp:   (!) 97.4 F (36.3 C)   TempSrc:   Oral   SpO2:  100%    Weight: 80 kg     Height:        Intake/Output Summary (Last 24 hours) at 06/26/2019 1038 Last data filed at 06/25/2019 1830 Gross per 24 hour  Intake --  Output 375 ml  Net -375 ml   Last 3 Weights 06/26/2019 06/25/2019 06/24/2019  Weight (lbs) 176 lb 5.9 oz 177 lb 4 oz 175 lb 0.7 oz  Weight (kg) 80 kg 80.4 kg 79.4 kg       Telemetry    Atrial flutter with CVR- Personally Reviewed  ECG    Atrial flutter with variable AV block and QTx 437ms  - Personally Reviewed  Physical Exam   GEN: Well nourished, well developed in no acute distress HEENT: Normal NECK: No JVD; No carotid bruits LYMPHATICS: No lymphadenopathy CARDIAC:irregularly irregular, no murmurs, rubs, gallops RESPIRATORY:  Clear to auscultation without rales, wheezing or rhonchi  ABDOMEN: Soft, non-tender, non-distended MUSCULOSKELETAL:  Trace LE edema; No deformity  SKIN: Warm and dry NEUROLOGIC:  Alert and oriented x 3 PSYCHIATRIC:  Normal affect    Labs    High Sensitivity Troponin:   No results for input(s): TROPONINIHS in the last 720 hours.    Chemistry Recent Labs  Lab 06/20/19 0500 06/20/19 0500 06/21/19 0532 06/21/19 0532 06/22/19 0500 06/23/19 0703 06/24/19 0730 06/25/19 0500 06/26/19 0613  NA 137   < > 137   < > 137   < > 140 139 137  K 3.7   < > 3.9   < > 3.4*   < > 3.5 4.2 4.9  CL 102   < >  101   < > 102   < > 102 104 105  CO2 24   < > 25   < > 26   < > 27 28 25   GLUCOSE 107*   < > 155*   < > 131*   < > 80 86 86  BUN 20   < > 19   < > 19   < > 15 15 13   CREATININE 0.98   < > 1.06   < > 1.06   < > 0.90 0.84 0.86  CALCIUM 7.4*   < > 7.7*   < > 7.4*   < > 7.3* 7.6* 7.5*  PROT 4.5*  --  4.7*  --  4.4*  --   --   --   --   ALBUMIN 2.0*  --  2.0*  --  1.9*  --   --   --   --   AST 24  --  36  --  40  --   --   --   --   ALT 29  --  32  --  35  --   --   --   --   ALKPHOS 62  --  66  --  61  --   --   --   --   BILITOT 0.8  --  0.6  --  0.4  --   --   --   --   GFRNONAA >60   < > >60   < > >60   < > >60 >60 >60  GFRAA >60   < > >60   < > >60   < > >60 >60 >60  ANIONGAP 11   < > 11   < > 9   < > 11 7 7    < > = values in this interval not displayed.     Hematology Recent Labs  Lab 06/24/19 0730 06/25/19 0500 06/26/19 0613  WBC 4.3 6.1 6.9  RBC 3.25* 3.20* 3.16*  HGB 8.4* 8.3* 8.4*  HCT 28.1* 27.7* 27.9*   MCV 86.5 86.6 88.3  MCH 25.8* 25.9* 26.6  MCHC 29.9* 30.0 30.1  RDW 18.4* 18.6* 18.7*  PLT 208 246 277    BNPNo results for input(s): BNP, PROBNP in the last 168 hours.   Lactic Acid, Venous    Component Value Date/Time   LATICACIDVEN 1.8 06/19/2019 1503   Magnesium  Date Value Ref Range Status  06/26/2019 2.0 1.7 - 2.4 mg/dL Final    Comment:    Performed at [facility], [place] [address] [place], [place] [address]  06/25/2019 1.9 1.7 - 2.4 mg/dL Final    Comment:    Performed at [facility], [place] [address] [place], [place] [address]  06/24/2019 1.9 1.7 - 2.4 mg/dL Final    Comment:    Performed at [facility], [place] [address], [place], [place] [address]   Phosphorus  Date Value Ref Range Status  06/23/2019 3.2 2.5 - 4.6 mg/dL Final    Comment:    Performed at [facility], [place] [address], [place], [place] [address]      Radiology    DG CHEST PORT 1 VIEW  Result Date: 06/24/2019 CLINICAL DATA:  Hypotension. EXAM: PORTABLE CHEST 1 VIEW COMPARISON:  Jun 18, 2019 FINDINGS: A right-sided PICC line is seen with its distal tip noted at the junction of the superior vena cava and right atrium. Mild  diffusely increased lung markings are seen bilaterally. Moderate severity atelectasis and/or infiltrate is seen within the retrocardiac region of the left lung base. Small bilateral pleural effusions are seen, right greater than left. No pneumothorax is identified. The cardiac silhouette is mildly enlarged. The visualized skeletal structures are unremarkable. IMPRESSION: 1. Right-sided PICC line in satisfactory position. 2. Moderate severity atelectasis and/or infiltrate within the retrocardiac region of the left lung base. 3. Small bilateral pleural effusions, right greater than left. Electronically Signed   By: Virgina Norfolk M.D.   On: 06/24/2019 15:50   ECHOCARDIOGRAM COMPLETE  Result Date: 06/24/2019     ECHOCARDIOGRAM REPORT   Patient Name:   Tony Saunders Date of Exam: 06/24/2019 Medical Rec #:  [id number]            Height:       72.0 in Accession #:    [id number]           Weight:       175.0 lb Date of Birth:  [birth year]            BSA:          2.013 m Patient Age:    64 years             BP:           90/64 mmHg Patient Gender: M                    HR:           98 bpm. Exam Location:  Inpatient Procedure: 2D Echo, Cardiac Doppler and Color Doppler Indications:    Pericardial Effusion  History:        Patient has prior history of Echocardiogram examinations, most                 recent 05/14/2019. Signs/Symptoms:Hypotension.  Sonographer:    Merrie Roof RDCS Referring Phys: [place]  1. Left ventricular ejection fraction, by estimation, is 35 to 40%. The left ventricle has moderately decreased function. The left ventricle demonstrates global hypokinesis. The left ventricular internal cavity size was mildly dilated. Left ventricular diastolic parameters are indeterminate.  2. Right ventricular systolic function is normal. The right ventricular size is normal. Tricuspid regurgitation signal is inadequate for assessing PA pressure.  3. Left atrial size was mildly dilated.  4. Right atrial size was mildly dilated.  5. The mitral valve is normal in structure. Severe mitral valve regurgitation from teathering of posterior MV leaflet. No evidence of mitral stenosis.  6. The aortic valve is tricuspid. Aortic valve regurgitation is not visualized. Mild to moderate aortic valve sclerosis/calcification is present, without any evidence of aortic stenosis.  7. The inferior vena cava is normal in size with greater than 50% respiratory variability, suggesting right atrial pressure of 3 mmHg.  8. Large pleural effusion in the left lateral region. No pericardial effusion. FINDINGS  Left Ventricle: Left ventricular ejection fraction, by estimation, is 35 to 40%. The left ventricle has moderately  decreased function. The left ventricle demonstrates global hypokinesis. The left ventricular internal cavity size was mildly dilated. There is no left ventricular hypertrophy. Left ventricular diastolic parameters are indeterminate. Right Ventricle: The right ventricular size is normal. No increase in right ventricular wall thickness. Right ventricular systolic function is normal. Tricuspid regurgitation signal is inadequate for assessing PA pressure. Left Atrium: Left atrial size was mildly dilated. Right Atrium: Right atrial size was mildly dilated. Pericardium: There is  no evidence of pericardial effusion. Mitral Valve: The mitral valve is normal in structure. There is mild thickening of the mitral valve leaflet(s). Normal mobility of the mitral valve leaflets. Severe mitral valve regurgitation, with eccentric posteriorly directed jet. No evidence of mitral valve stenosis. Tricuspid Valve: The tricuspid valve is normal in structure. Tricuspid valve regurgitation is mild . No evidence of tricuspid stenosis. Aortic Valve: The aortic valve is tricuspid. Aortic valve regurgitation is not visualized. Mild to moderate aortic valve sclerosis/calcification is present, without any evidence of aortic stenosis. Pulmonic Valve: The pulmonic valve was normal in structure. Pulmonic valve regurgitation is trivial. No evidence of pulmonic stenosis. Aorta: The aortic root is normal in size and structure. Venous: The inferior vena cava is normal in size with greater than 50% respiratory variability, suggesting right atrial pressure of 3 mmHg. IAS/Shunts: No atrial level shunt detected by color flow Doppler. Additional Comments: There is a large pleural effusion in the left lateral region.  LEFT VENTRICLE PLAX 2D LVIDd:         5.60 cm      Diastology LVIDs:         4.20 cm      LV e' lateral: 11.10 cm/s LV PW:         0.80 cm      LV e' medial:  9.57 cm/s LV IVS:        1.90 cm LVOT diam:     2.00 cm LV SV:         46 LV SV Index:    23 LVOT Area:     3.14 cm  LV Volumes (MOD) LV vol d, MOD A2C: 199.0 ml LV vol d, MOD A4C: 179.0 ml LV vol s, MOD A2C: 126.0 ml LV vol s, MOD A4C: 78.5 ml LV SV MOD A2C:     73.0 ml LV SV MOD A4C:     179.0 ml LV SV MOD BP:      86.9 ml RIGHT VENTRICLE RV Basal diam:  3.40 cm RV S prime:     9.46 cm/s TAPSE (M-mode): 0.9 cm LEFT ATRIUM             Index       RIGHT ATRIUM           Index LA diam:        4.90 cm 2.43 cm/m  RA Area:     23.50 cm LA Vol (A2C):   70.0 ml 34.77 ml/m RA Volume:   79.10 ml  39.29 ml/m LA Vol (A4C):   78.3 ml 38.89 ml/m LA Biplane Vol: 82.1 ml 40.78 ml/m  AORTIC VALVE LVOT Vmax:   97.50 cm/s LVOT Vmean:  58.300 cm/s LVOT VTI:    0.147 m  AORTA Ao Root diam: 3.10 cm MR Peak grad:    94.7 mmHg MR Mean grad:    60.0 mmHg   SHUNTS MR Vmax:         486.50 cm/s Systemic VTI:  0.15 m MR Vmean:        362.0 cm/s  Systemic Diam: 2.00 cm MR PISA:         5.09 cm MR PISA Eff ROA: 32 mm MR PISA Radius:  0.90 cm [name] MD Electronically signed by [name] MD Signature Date/Time: 06/24/2019/1:10:34 PM    Final     Cardiac Studies   Echocardiogram 05/14/2019: Impressions: 1. Compared to echo in March 2021, no significant change in LVEF or MR.  Pt is  now in atrial fibrillation.  2. LVEF is severely depressed with severe hypokinesis/akinesis of the  base/mid inferior, inferosepal, infer walls and apical walls; hypokinesis  elsewhere . Left ventricular ejection fraction, by estimation, is 30%. The  left ventricle has severely  decreased function. The left ventricular internal cavity size was mildly  dilated. There is mild left ventricular hypertrophy. Left ventricular  diastolic parameters are indeterminate.  3. Right ventricular systolic function is normal. The right ventricular  size is normal. There is normal pulmonary artery systolic pressure.  4. Left atrial size was mildly dilated.  5. MR is directed posteriorly into LA, likely due to tethering of  posterior  leaflet. Severe mitral valve regurgitation.  6. The aortic valve is tricuspid. Aortic valve regurgitation is not  visualized. Mild aortic valve sclerosis is present, with no evidence of  aortic valve stenosis.  7. The inferior vena cava is normal in size with greater than 50%  respiratory variability, suggesting right atrial pressure of 3 mmHg.   Patient Profile     64 y.o. male with a history of CAD with recent STEMI in 03/2019 s/p DES x2 to RCA and DES to RPDA and then staged PCI with DES to LAD, DES x2 to CX, and DES to OM3; chronic systolic CHF/ ischemic cardiomyopathy with EF of 30% on Echo in 04/2019; severe mitral regurgitation, paroxsymal atrial fibrillation/flutter on Amiodarone and Xarelto; COPD; seizure disorder; and diffuse large B-cell lymphoma. Patient admitted on 06/15/2019 for hypotension and pancytopnenia secondary to chemotherapy. Cardiology consulted on 06/18/2019 for further evaluation of atrial fibrillation with RVR at the request of Dr. Louanne Belton.   Assessment & Plan    1.  Atrial Fibrillation/Flutter with RVR  -s/p successful DCCV last week but went back into atrial fultter with variable block in the 80's -Amio increased to 200mg  BID over the weekend to reload  -continue Xarelto 20 mg qd.  -recommend avoiding all QT prolonging drugs since he needs to be on Amio (he is on zofran, Zoloft)>>QTc today 444ms  2.  Acute on Chronic Combined CHF -EF 30% on TTE 4/26.  -Diuresed with IV lasix.   -he put out 375cc yesterday ? Accuracy and is net neg 2.2L -minimal LE edema, suspect this is multifactorial with low albumin (1.9) and obstruction from lymphoma contributing  -Co-ox yesterday 44 and IV milrinone started and co-ox this am improved to 69 -BP improved and in the 24'M systolic -would benefit from getting back in NSR to improve cardiac output>>planned for DCCV at [facility] tomorrow am -BP too low for guideline-directed HF therapy -Holding Lasix due to hypotension>>does not appear  volume overloaded on exam -creatinine stable at 0.86 -continue IV milrinone for now until gets cardioverted tomorrow  3.  CAD   - s/p STEMI in 03/2019 treated with DES x2 to RCA and DES to RPDA and then staged PCI with DES to LAD, DES x2 to CX, and DES to OM3. -on Brilinta, no ASA 2nd Xarelto -continue statin -hold BB due to hypotension  4.  Severe Mitral Regurgitation - see echo results above, is likely due to tethering of posterior leaflet - could consider Mitraclip eval once treated for lymphoma  5.  Hypotensive - on midodrine 2.5 mg tid - likely cardiogenic due to LV dysfunction and severe MR - BP improved on low dose IV Milrinone - MAP ok (26mmHg), asymptomatic  6.  Electrolyte Abnormalities - K+ nadir 2.3 on 05/29, rec'd IV/PO supplement - now on KCl 40 meq TID - K+today 4.9 -  Mg nadir 1.6, s/p IV supp  - Mag today 2.0 - continue Mag oxide to 400mg  BID  7.  Recently Diagnosed High-Grade Lymphoma  - extensive retroperitoneal, pelvic, and inguinal lymphadenopathy. There is also involvement of the spleen and near complete infiltration of the right kidney.  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  -Suspicious for infiltrating disease of the right iliac and left pubic bones.  -chemo started 05/19, now on hold -2D echo 6/6 with no pericardial effusion -discussed with Dr. Alen Blew and hopefully will be able to get second cycle of chemo (CHOP with Rituximab) once   hemodynamics stabilize after cardioversion -he is ok with transferring him to [facility] to stabilize cardiac issues and they will follow along.   -will arrange transfer to [facility] to [facility], [place] service and AHF to follow  8.  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  > 50% of time was spent in direct patient care.     For questions or updates, please contact [place] Please consult [email or web address] for contact info under      Signed, [name], MD  06/26/2019, 10:38 AM

## 2019-06-26 NOTE — Progress Notes (Signed)
Pt scheduled for cardioversion tomorrow at 0900 at [facility].  Transport arranged with [facility] to arrive at 0800.  Lauren, bedside RN, aware.  Vista Lawman, RN

## 2019-06-26 NOTE — Progress Notes (Signed)
PROGRESS NOTE  Tony Saunders GDJ:[id number] DOB: [birth year] DOA: 06/15/2019 PCP: [name], [name], MD   LOS: 11 days   Brief narrative: As per HPI and previous provider,  64 year old male with diffuse B-cell lymphomaMay2021, started chemotherapy 5/19, presented for an outpatient port flushandradiation radiation simulation, found to be hypotensive and pancytopenic and was admitted to hospital for further evaluation and treatment.  During hospitalization, patient developed the atrial flutter with rapid ventricular response and cardiology was consulted patient was then transferred to the stepdown unit for closer monitoring and treatment.  He was started on amiodarone drip and underwent cardioversion.  He briefly converted but was again in flutter.  Patient was also started on milrinone drip during hospitalization.  Continued to receive radiation treatment for B-cell lymphoma.  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  Cardiology on board.  On amiodarone 200 mg twice daily.  Plan is repeat cardioversion on 06/27/2019.  Continue  Brilinta and Xarelto.  Discontinued QT prolonging drugs including Zofran and Zoloft.  2D echocardiogram on  06/24/2019 with ejection fraction of 35 to 40% with severe MR.    Hypotension On midodrine.  secondary to systolic cardiomyopathy with atrial fibrillation..  On decreased dose of metoprolol IV as needed for tachycardia.  Milrinone has been added by cardiology.  No fever or leukocytosis.  No obvious source of infection at this time .  Urinalysis with no evidence of infection.  Chest x-ray noted with effusion/atelectasis.   Pancytopenia secondary to chemotherapy, symptomatic anemia.   Status post 3 units of packed RBC this admission.  Hemoglobin at 8.4 today from 8.3, no evidence of bleeding.  WBC has normalized at 6.9.  Platelets at 277.  On Brilinta and Xarelto.   If downtrending hemoglobin, might need blood transfusion.  Oropharyngeal candidiasis secondary to chemotherapy Received Diflucan, nystatin.  Currently on regular consistency diet.   Hypokalemia, hypophosphatemia Potassium level is improved to 4.9 today.  On p.o. potassium 3 times a day-we will discontinue for now.  Check BMP phosphate level in a.m.  Colonic ileus  Improved.    Diffuse B-cell lymphoma  Currently undergoing radiation treatment during hospitalization.  Patient has been seen by Dr. Alen Blew oncology who recommends delaying second cycle of chemotherapy after cardiac issues have addressed.  Seizure disorder  Continue Lamictal and Keppra  VTE Prophylaxis: Xarelto  Code Status: Full code  Family Communication:  None today.    Status is: Inpatient  Remains inpatient appropriate because: status post cardio version-plan for repeat cardioversion on 06/27/2019, stepdown unit admission, patient with multiple comorbidities including lymphoma needing radiation.   Dispo: The patient is from: Home              Anticipated d/c is to: Home with home health              Anticipated d/c  date is: 2 to 3 days.  Patient will likely be transferred to [facility] for further heart failure care depending upon cardioversion  tomorrow.  Spoke with cardiology about it.              Patient currently is not medically stable to d/c.  Consultants:  Oncology  Cardiology  Radiation oncology  Procedures:  PRBC transfusion  DC cardioversion on 06/22/2019  Antibiotics/antifungals:  . Fluconazole 5/30>6/7  Subjective: Today, patient was seen and examined at bedside.  Denies any chest pain, shortness of breath, fever, chills or rigor.  Denies any dizziness, lightheadedness.  Objective: Vitals:   06/26/19 1200 06/26/19 1300  BP: 96/61 107/75  Pulse: 79   Resp: (!) 32 (!) 26  Temp: (!) 97.5 F (36.4 C)   SpO2: (!) 81%     Intake/Output Summary (Last 24 hours) at 06/26/2019 1442 Last data filed at 06/25/2019 1830 Gross per 24 hour  Intake --  Output 375 ml  Net -375 ml   Filed Weights   06/24/19 0500 06/25/19 0500 06/26/19 0500  Weight: 79.4 kg 80.4 kg 80 kg   Body mass index is 23.92 kg/m.   Physical Exam:  General:  thinly built, not in obvious distress, alert awake oriented.  Chronically ill. HENT:   Mild pallor noted. Chest:  Clear breath sounds.  Diminished breath sounds bilaterally.  CVS: S1 &S2 heard.  Systolic murmur.  Irregular rhythm. Abdomen: Soft, nontender, nondistended.  Bowel sounds are heard.   Extremities: No cyanosis, clubbing but with trace peripheral edema.,  Psych: Alert, awake and oriented, normal mood CNS:  No cranial nerve deficits.  Power equal in all extremities.   Skin: Warm and dry.  Petechiae.   Data Review: I have personally reviewed the following laboratory data and studies,  CBC: Recent Labs  Lab 06/22/19 0500 06/23/19 0500 06/24/19 0730 06/25/19 0500 06/26/19 0613  WBC 4.1 5.0 4.3 6.1 6.9  HGB 7.9* 9.1* 8.4* 8.3* 8.4*  HCT 25.7* 30.1* 28.1* 27.7* 27.9*  MCV 85.1 85.8 86.5 86.6 88.3  PLT 167 217 208 246 482   Basic Metabolic Panel: Recent Labs  Lab 06/20/19 0500 06/20/19 0500 06/21/19 0532 06/21/19 0532 06/22/19 0500 06/23/19 0703  06/24/19 0730 06/25/19 0500 06/26/19 0613  NA 137   < > 137   < > 137 139 140 139 137  K 3.7   < > 3.9   < > 3.4* 3.0* 3.5 4.2 4.9  CL 102   < > 101   < > 102 102 102 104 105  CO2 24   < > 25   < > 26 25 27 28 25   GLUCOSE 107*   < > 155*   < > 131* 81 80 86 86  BUN 20   < > 19   < > 19 16 15 15 13   CREATININE 0.98   < > 1.06   < > 1.06 0.97 0.90 0.84 0.86  CALCIUM 7.4*   < > 7.7*   < > 7.4* 7.2* 7.3* 7.6* 7.5*  MG 1.7   < > 1.8   < > 1.7 1.8 1.9 1.9 2.0  PHOS 3.6  --  3.0  --  2.4* 3.2  --   --   --    < > = values in this interval not displayed.   Liver Function Tests: Recent Labs  Lab 06/20/19 0500 06/21/19 0532 06/22/19 0500  AST 24 36 40  ALT 29 32 35  ALKPHOS  62 66 61  BILITOT 0.8 0.6 0.4  PROT 4.5* 4.7* 4.4*  ALBUMIN 2.0* 2.0* 1.9*   No results for input(s): LIPASE, AMYLASE in the last 168 hours. No results for input(s): AMMONIA in the last 168 hours. Cardiac Enzymes: No results for input(s): CKTOTAL, CKMB, CKMBINDEX, TROPONINI in the last 168 hours. BNP (last 3 results) No results for input(s): BNP in the last 8760 hours.  ProBNP (last 3 results) No results for input(s): PROBNP in the last 8760 hours.  CBG: No results for input(s): GLUCAP in the last 168 hours. Recent Results (from the past 240 hour(s))  MRSA PCR Screening     Status: None   Collection Time: 06/18/19  5:43 PM   Specimen: Nasal Mucosa; Nasopharyngeal  Result Value Ref Range Status   MRSA by PCR NEGATIVE NEGATIVE Final    Comment:        The GeneXpert MRSA Assay (FDA approved for NASAL specimens only), is one component of a comprehensive MRSA colonization surveillance program. It is not intended to diagnose MRSA infection nor to guide or monitor treatment for MRSA infections. Performed at [facility], [place] [address] [place], [place] [address]      Studies: DG CHEST PORT 1 VIEW  Result Date: 06/24/2019 CLINICAL DATA:  Hypotension. EXAM: PORTABLE CHEST 1 VIEW  COMPARISON:  Jun 18, 2019 FINDINGS: A right-sided PICC line is seen with its distal tip noted at the junction of the superior vena cava and right atrium. Mild diffusely increased lung markings are seen bilaterally. Moderate severity atelectasis and/or infiltrate is seen within the retrocardiac region of the left lung base. Small bilateral pleural effusions are seen, right greater than left. No pneumothorax is identified. The cardiac silhouette is mildly enlarged. The visualized skeletal structures are unremarkable. IMPRESSION: 1. Right-sided PICC line in satisfactory position. 2. Moderate severity atelectasis and/or infiltrate within the retrocardiac region of the left lung base. 3. Small bilateral pleural effusions, right greater than left. Electronically Signed   By: Virgina Norfolk M.D.   On: 06/24/2019 15:50      Flora Lipps, MD  [facility] 06/26/2019

## 2019-06-27 ENCOUNTER — Ambulatory Visit
Admission: RE | Admit: 2019-06-27 | Discharge: 2019-06-27 | Disposition: A | Discharge status: Home / Self Care | Payer: No Typology Code available for payment source | Source: Ambulatory Visit | Attending: Radiation Oncology | Admitting: Radiation Oncology

## 2019-06-27 ENCOUNTER — Inpatient Hospital Stay (HOSPITAL_COMMUNITY): Payer: No Typology Code available for payment source | Admitting: Anesthesiology

## 2019-06-27 ENCOUNTER — Encounter (HOSPITAL_COMMUNITY): Payer: Self-pay | Admitting: Family Medicine

## 2019-06-27 ENCOUNTER — Other Ambulatory Visit: Payer: Self-pay

## 2019-06-27 ENCOUNTER — Encounter (HOSPITAL_COMMUNITY)
Admission: EM | Disposition: A | Discharge status: Home / Self Care | Payer: Self-pay | Source: Home / Self Care | Attending: Internal Medicine

## 2019-06-27 HISTORY — PX: CARDIOVERSION: SHX1299

## 2019-06-27 LAB — GLUCOSE, CAPILLARY: Glucose-Capillary: 85 mg/dL (ref 70–99)

## 2019-06-27 LAB — COOXEMETRY PANEL
Carboxyhemoglobin: 2.4 % — ABNORMAL HIGH (ref 0.5–1.5)
Methemoglobin: 0.4 % (ref 0.0–1.5)
O2 Saturation: 88.2 %
Total hemoglobin: 8.2 g/dL — ABNORMAL LOW (ref 12.0–16.0)

## 2019-06-27 LAB — CBC
HCT: 25.3 % — ABNORMAL LOW (ref 39.0–52.0)
Hemoglobin: 7.6 g/dL — ABNORMAL LOW (ref 13.0–17.0)
MCH: 26.1 pg (ref 26.0–34.0)
MCHC: 30 g/dL (ref 30.0–36.0)
MCV: 86.9 fL (ref 80.0–100.0)
Platelets: 261 10*3/uL (ref 150–400)
RBC: 2.91 MIL/uL — ABNORMAL LOW (ref 4.22–5.81)
RDW: 19 % — ABNORMAL HIGH (ref 11.5–15.5)
WBC: 5.2 10*3/uL (ref 4.0–10.5)
nRBC: 0 % (ref 0.0–0.2)

## 2019-06-27 LAB — BASIC METABOLIC PANEL
Anion gap: 5 (ref 5–15)
BUN: 13 mg/dL (ref 8–23)
CO2: 27 mmol/L (ref 22–32)
Calcium: 7.5 mg/dL — ABNORMAL LOW (ref 8.9–10.3)
Chloride: 103 mmol/L (ref 98–111)
Creatinine, Ser: 0.85 mg/dL (ref 0.61–1.24)
GFR calc Af Amer: >60 mL/min (ref >60)
GFR calc non Af Amer: >60 mL/min (ref >60)
Glucose, Bld: 78 mg/dL (ref 70–99)
Potassium: 4.1 mmol/L (ref 3.5–5.1)
Sodium: 135 mmol/L (ref 135–145)

## 2019-06-27 LAB — PROTIME-INR
INR: 2.5 — ABNORMAL HIGH (ref 0.8–1.2)
Prothrombin Time: 26.2 seconds — ABNORMAL HIGH (ref 11.4–15.2)

## 2019-06-27 LAB — MAGNESIUM: Magnesium: 2.3 mg/dL (ref 1.7–2.4)

## 2019-06-27 LAB — PHOSPHORUS: Phosphorus: 2.1 mg/dL — ABNORMAL LOW (ref 2.5–4.6)

## 2019-06-27 SURGERY — CARDIOVERSION
Anesthesia: General

## 2019-06-27 MED ORDER — PROPOFOL 10 MG/ML IV BOLUS
INTRAVENOUS | Status: DC | PRN
  Administered 2019-06-27: 50 mg via INTRAVENOUS

## 2019-06-27 MED ORDER — K PHOS MONO-SOD PHOS DI & MONO 155-852-130 MG PO TABS
500.0000 mg | ORAL_TABLET | Freq: Three times a day (TID) | ORAL | Status: DC
  Administered 2019-06-27 – 2019-06-28 (×4): 500 mg via ORAL
  Filled 2019-06-27 (×5): qty 2

## 2019-06-27 NOTE — Progress Notes (Signed)
   06/27/19 0541  Medical Necessity for Transport Certificate --- IF THIS TRANSPORT IS ROUND TRIP OR SCHEDULED AND REPEATED, A PHYSICIAN MUST COMPLETE THIS FORM  Transport from: (Location) [facility]  Transport to (Location) [facility]  Did the patient arrive from a [place] or [place]? No  Is this the closest appropriate facility? Yes  Date of Transport Service 06/27/19  Name of [place]  Reason for Transport Procedure  Is this a hospital to hospital transfer? Yes  Specific Services Available at 2nd Facility  (cardioversion and continued cardiac care)  Is this a hospice patient? No  Describe the Medical Condition Atrial fib/flutter requiring 2nd cardioversion  Q1 Are ALL the following "true"? 1. Patient unable to get up from bed without assistance  AND  2. Unable to ambulate  AND  3. Unable to sit in a chair, including wheelchair. Yes  Q2 Could the patient be transported safely by other means of transportation (I.E., wheelchair van)? No  Q3 Please check any of the following conditions that apply at the time of transport: Requires cardiac monitoring;Requires IV maintenance  Electronic Signature Marylin Crosby  Credentials RN  Date Signed 06/27/19  Print Form Print

## 2019-06-27 NOTE — TOC Progression Note (Signed)
Transition of Care [facility]) - Progression Note    Patient Details  Name: Lc Joynt MRN: [id number] Date of Birth: [birth year]  Transition of Care [facility]) CM/SW Contact  Purcell Mouton, RN Phone Number: 06/27/2019, 3:38 PM  Clinical Narrative:     TOC will continue to follow for discharge need.   Expected Discharge Plan: Home/Self Care Barriers to Discharge: Continued Medical Work up  Expected Discharge Plan and Services Expected Discharge Plan: Home/Self Care   Discharge Planning Services: CM Consult   Living arrangements for the past 2 months: Single Family Home                                       Social Determinants of Health (SDOH) Interventions    Readmission Risk Interventions No flowsheet data found.

## 2019-06-27 NOTE — Anesthesia Postprocedure Evaluation (Signed)
Anesthesia Post Note  Patient: Tony Saunders  Procedure(s) Performed: CARDIOVERSION (N/A )     Patient location during evaluation: Endoscopy Anesthesia Type: General Level of consciousness: awake and alert Pain management: pain level controlled Vital Signs Assessment: post-procedure vital signs reviewed and stable Respiratory status: spontaneous breathing, nonlabored ventilation and respiratory function stable Cardiovascular status: blood pressure returned to baseline and stable Postop Assessment: no apparent nausea or vomiting Anesthetic complications: no    Last Vitals:  Vitals:   06/27/19 0840 06/27/19 0910  BP: 103/63 (!) 93/59  Pulse: 85 97  Resp: 18 (!) 28  Temp: 36.6 C 36.6 C  SpO2: 100% 99%    Last Pain:  Vitals:   06/27/19 0910  TempSrc: Temporal  PainSc: 0-No pain                 Everhett Bozard,W. EDMOND

## 2019-06-27 NOTE — Transfer of Care (Signed)
Immediate Anesthesia Transfer of Care Note  Patient: Tony Saunders  Procedure(s) Performed: CARDIOVERSION (N/A )  Patient Location: Endoscopy Unit  Anesthesia Type:MAC  Level of Consciousness: awake  Airway & Oxygen Therapy: Patient Spontanous Breathing  Post-op Assessment: Report given to RN and Post -op Vital signs reviewed and stable  Post vital signs: Reviewed and stable  Last Vitals:  Vitals Value Taken Time  BP 93/59 06/27/19 0908  Temp    Pulse 97 06/27/19 0908  Resp 28 06/27/19 0908  SpO2 99 % 06/27/19 0908  Vitals shown include unvalidated device data.  Last Pain:  Vitals:   06/27/19 0840  TempSrc: Oral  PainSc: 0-No pain      Patients Stated Pain Goal: 3 (76/16/07 3710)  Complications: No apparent anesthesia complications

## 2019-06-27 NOTE — Progress Notes (Signed)
PT Cancellation Note  Patient Details Name: Tony Saunders MRN: [id number] DOB: 12-Apr-1955   Cancelled Treatment:    Reason Eval/Treat Not Completed: Other (comment), noted patient to transfer to [facility]. Will continue to follow.    Claretha Cooper 06/27/2019 [place] Pager [phone] Office [phone] , 6:58 AM

## 2019-06-27 NOTE — Progress Notes (Signed)
Progress Note  Patient Name: Tony Saunders Date of Encounter: 06/27/2019  Primary Cardiologist: Glenetta Hew, MD   Subjective   Patient seen down in endoscopy.  Just had DCCV to NSR.  SBP stable in the upper 90's  Inpatient Medications    Scheduled Meds: . allopurinol  300 mg Oral Daily  . amiodarone  200 mg Oral BID  . calcium carbonate  1 tablet Oral TID  . Chlorhexidine Gluconate Cloth  6 each Topical Daily  . feeding supplement  1 Container Oral Q breakfast  . feeding supplement (ENSURE ENLIVE)  237 mL Oral BID BM  . lamoTRIgine  100 mg Oral BID  . levETIRAcetam  750 mg Oral BID  . magnesium oxide  400 mg Oral BID  . mouth rinse  15 mL Mouth Rinse BID  . midodrine  2.5 mg Oral TID WC  . mirtazapine  15 mg Oral QHS  . rivaroxaban  20 mg Oral Q supper  . rosuvastatin  5 mg Oral QHS  . sodium chloride flush  3 mL Intravenous Q12H  . sodium chloride flush  3 mL Intravenous Q12H  . ticagrelor  90 mg Oral BID   Continuous Infusions: . sodium chloride    . milrinone 0.125 mcg/kg/min (06/26/19 2000)   PRN Meds: sodium chloride, acetaminophen **OR** acetaminophen, metoprolol tartrate, morphine injection, nitroGLYCERIN, pantoprazole, sodium chloride flush, sodium chloride flush   Vital Signs    Vitals:   06/27/19 0630 06/27/19 0800 06/27/19 0840 06/27/19 0910  BP: (!) 95/54  103/63 (!) 93/59  Pulse:   85 97  Resp:   18 (!) 28  Temp:  (!) 97.3 F (36.3 C) 97.8 F (36.6 C) 97.8 F (36.6 C)  TempSrc:  Oral Oral Temporal  SpO2:   100% 99%  Weight:      Height:        Intake/Output Summary (Last 24 hours) at 06/27/2019 1019 Last data filed at 06/27/2019 0529 Gross per 24 hour  Intake 54.39 ml  Output 950 ml  Net -895.61 ml   Last 3 Weights 06/27/2019 06/26/2019 06/25/2019  Weight (lbs) 177 lb 7.5 oz 176 lb 5.9 oz 177 lb 4 oz  Weight (kg) 80.5 kg 80 kg 80.4 kg      Telemetry    NSR- Personally Reviewed  ECG    NSR with low voltage QRS and nonspecific ST  abnormality.  QTC read out as prolonged but per my calculation is 432ms  - Personally Reviewed  Physical Exam   GEN: Well nourished, well developed in no acute distress HEENT: Normal NECK: No JVD; No carotid bruits LYMPHATICS: No lymphadenopathy CARDIAC:RRR, no murmurs, rubs, gallops RESPIRATORY:  Clear to auscultation without rales, wheezing or rhonchi  ABDOMEN: Soft, non-tender, non-distended MUSCULOSKELETAL:  No edema; No deformity  SKIN: Warm and dry NEUROLOGIC:  Alert and oriented x 3 PSYCHIATRIC:  Normal affect    Labs    High Sensitivity Troponin:   No results for input(s): TROPONINIHS in the last 720 hours.    Chemistry Recent Labs  Lab 06/21/19 0532 06/21/19 0532 06/22/19 0500 06/23/19 0703 06/25/19 0500 06/26/19 0613 06/27/19 0430  NA 137   < > 137   < > 139 137 135  K 3.9   < > 3.4*   < > 4.2 4.9 4.1  CL 101   < > 102   < > 104 105 103  CO2 25   < > 26   < > 28 25 27   GLUCOSE  155*   < > 131*   < > 86 86 78  BUN 19   < > 19   < > 15 13 13   CREATININE 1.06   < > 1.06   < > 0.84 0.86 0.85  CALCIUM 7.7*   < > 7.4*   < > 7.6* 7.5* 7.5*  PROT 4.7*  --  4.4*  --   --   --   --   ALBUMIN 2.0*  --  1.9*  --   --   --   --   AST 36  --  40  --   --   --   --   ALT 32  --  35  --   --   --   --   ALKPHOS 66  --  61  --   --   --   --   BILITOT 0.6  --  0.4  --   --   --   --   GFRNONAA >60   < > >60   < > >60 >60 >60  GFRAA >60   < > >60   < > >60 >60 >60  ANIONGAP 11   < > 9   < > 7 7 5    < > = values in this interval not displayed.     Hematology Recent Labs  Lab 06/25/19 0500 06/26/19 0613 06/27/19 0430  WBC 6.1 6.9 5.2  RBC 3.20* 3.16* 2.91*  HGB 8.3* 8.4* 7.6*  HCT 27.7* 27.9* 25.3*  MCV 86.6 88.3 86.9  MCH 25.9* 26.6 26.1  MCHC 30.0 30.1 30.0  RDW 18.6* 18.7* 19.0*  PLT 246 277 261    BNPNo results for input(s): BNP, PROBNP in the last 168 hours.   Lactic Acid, Venous    Component Value Date/Time   LATICACIDVEN 1.2 06/26/2019 2145    Magnesium  Date Value Ref Range Status  06/27/2019 2.3 1.7 - 2.4 mg/dL Final    Comment:    Performed at [facility], [place] [address], [place], [place] [address]  06/26/2019 2.0 1.7 - 2.4 mg/dL Final    Comment:    Performed at [facility], [place] [address] [place], [place] [address]  06/25/2019 1.9 1.7 - 2.4 mg/dL Final    Comment:    Performed at [facility], [place] [address], [place], [place] [address]   Phosphorus  Date Value Ref Range Status  06/27/2019 2.1 (L) 2.5 - 4.6 mg/dL Final    Comment:    Performed at [facility], [place] [address] [place],  [address]      Radiology    No results found.  Cardiac Studies   Echocardiogram 05/14/2019: Impressions: 1. Compared to echo in March 2021, no significant change in LVEF or MR.  Pt is now in atrial fibrillation.  2. LVEF is severely depressed with severe hypokinesis/akinesis of the  base/mid inferior, inferosepal, infer walls and apical walls; hypokinesis  elsewhere . Left ventricular ejection fraction, by estimation, is 30%. The  left ventricle has severely  decreased function. The left ventricular internal cavity size was mildly  dilated. There is mild left ventricular hypertrophy. Left ventricular  diastolic parameters are indeterminate.  3. Right ventricular systolic function is normal. The right ventricular  size is normal. There is normal pulmonary artery systolic pressure.  4. Left atrial size was mildly dilated.  5. MR is directed posteriorly into LA, likely due to tethering of  posterior leaflet. Severe mitral valve regurgitation.  6. The aortic valve is tricuspid. Aortic valve regurgitation is not  visualized. Mild aortic valve sclerosis is present, with no evidence of  aortic valve stenosis.  7. The inferior vena cava is normal in size with greater than 50%  respiratory variability, suggesting right atrial pressure of  3 mmHg.   Patient Profile     64 y.o. male with a history of CAD with recent STEMI in 03/2019 s/p DES x2 to RCA and DES to RPDA and then staged PCI with DES to LAD, DES x2 to CX, and DES to OM3; chronic systolic CHF/ ischemic cardiomyopathy with EF of 30% on Echo in 04/2019; severe mitral regurgitation, paroxsymal atrial fibrillation/flutter on Amiodarone and Xarelto; COPD; seizure disorder; and diffuse large B-cell lymphoma. Patient admitted on 06/15/2019 for hypotension and pancytopnenia secondary to chemotherapy. Cardiology consulted on 06/18/2019 for further evaluation of atrial fibrillation with RVR at the request of Dr. Louanne Belton.   Assessment & Plan    1.  Atrial Fibrillation/Flutter with RVR  -s/p successful DCCV last week but went back into atrial fultter with variable block in the 80's -Amio increased to 200mg  BID over the weekend to reload  -continue Xarelto 20 mg qd.  -recommend avoiding all QT prolonging drugs since he needs to be on Amio (he is on zofran, Zoloft)>>QTc today 45ms by my recalculation -s/p DCCV today to NSR  2.  Acute on Chronic Combined CHF -EF 30% on TTE 4/26.  -Diuresed with IV lasix.   -he put out 950cc yesterday ? Accuracy and is net neg 3L -minimal LE edema, suspect this is multifactorial with low albumin (1.9) and obstruction from lymphoma contributing  -will repeat Co-ox today now that he is back in NSR and if improved then wean off Milrinone -BP improved and in the 46'N systolic -BP too low for guideline-directed HF therapy -Holding Lasix due to hypotension>>does not appear volume overloaded on exam -creatinine stable at 0.85  3.  CAD   - s/p STEMI in 03/2019 treated with DES x2 to RCA and DES to RPDA and then staged PCI with DES to LAD, DES x2 to CX, and DES to OM3. -on Brilinta, no ASA 2nd Xarelto -continue statin -hold BB due to hypotension  4.  Severe Mitral Regurgitation - see echo results above, is likely due to tethering of posterior leaflet -  could consider Mitraclip eval once treated for lymphoma  5.  Hypotensive - on midodrine 2.5 mg tid - likely cardiogenic due to LV dysfunction and severe MR - BP improved on low dose IV Milrinone  6.  Electrolyte Abnormalities - K+ nadir 2.3 on 05/29, rec'd IV/PO supplement - now on KCl 40 meq TID - K+today 4.1 - Mg nadir 1.6, s/p IV supp  - Mag today 2.3 - continue Mag oxide to 400mg  BID  7.  Recently Diagnosed High-Grade Lymphoma  - extensive retroperitoneal, pelvic, and inguinal lymphadenopathy. There is also involvement of the spleen and near complete infiltration of the right kidney.  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  -Suspicious for infiltrating disease of the right iliac and left pubic bones.  -chemo started 05/19, now on hold -2D echo 6/6 with no pericardial effusion -discussed with Dr. Alen Blew and hopefully will be able to get second cycle of chemo (CHOP with Rituximab) once   hemodynamics stabilize after cardioversion -he is ok with transferring him to [facility] to stabilize cardiac issues and they will follow along.   -will arrange transfer  to [facility] to [facility] service and AHF to follow  8.  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  > 50% of time was spent in direct patient care.     For questions or updates, please contact [place] Please consult [email or web address] for contact info under      Signed, [name], MD  06/27/2019, 10:19 AM

## 2019-06-27 NOTE — Interval H&P Note (Signed)
History and Physical Interval Note:  06/27/2019 8:57 AM  Tony Saunders  has presented today for surgery, with the diagnosis of A-FIB.  The various methods of treatment have been discussed with the patient and family. After consideration of risks, benefits and other options for treatment, the patient has consented to  Procedure(s): CARDIOVERSION (N/A) as a surgical intervention.  The patient's history has been reviewed, patient examined, no change in status, stable for surgery.  I have reviewed the patient's chart and labs.  Questions were answered to the patient's satisfaction.     Skeet Latch, MD

## 2019-06-27 NOTE — Progress Notes (Signed)
PROGRESS NOTE  Tony Saunders LEX:[id number] DOB: 08/11/1955 DOA: 06/15/2019 PCP: [name], [name], MD   LOS: 12 days   Brief narrative: As per HPI and previous provider,  64 year old male with diffuse B-cell lymphomaMay2021, started chemotherapy 5/19, presented for an outpatient port flushandradiation radiation simulation, found to be hypotensive and pancytopenic and was admitted to hospital for further evaluation and treatment.  During hospitalization, patient developed atrial flutter with rapid ventricular response and cardiology was consulted patient was then transferred to the stepdown unit for closer monitoring and treatment.  He was started on amiodarone drip and underwent cardioversion.  He briefly converted but was again reverted in flutter.  Patient was also started on milrinone drip during hospitalization.  Continued to receive radiation treatment for B-cell lymphoma.  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  Cardiology on board.  On amiodarone 200 mg twice daily.  Plan is repeat cardioversion on 06/27/2019.  Continue  Brilinta and Xarelto.  Discontinued QT prolonging drugs including Zofran and Zoloft.  2D echocardiogram  on 06/24/2019 with ejection fraction of 35 to 40% with severe MR.    Hypotension On midodrine.  Hypotension likely secondary to systolic cardiomyopathy with atrial flutter.  On  metoprolol IV as needed for tachycardia. On milrinone drip. No fever or leukocytosis.  No obvious source of infection at this time .  Urinalysis with no evidence of infection.  Chest x-ray noted with effusion/atelectasis.   Pancytopenia secondary to chemotherapy, anemia.   Status post 3 units of packed RBC this admission.  Hemoglobin at 7.6 today.  WBC has normalized at 5.2.  Platelets at 261.  On Brilinta and Xarelto.    Oropharyngeal candidiasis secondary to chemotherapy completed Diflucan, nystatin.  Currently on regular consistency diet.   Hypokalemia, hypophosphatemia Hypokalemia resolved.  Phosphorus is still low at 2.1.Marland Kitchen? Replace  Colonic ileus  Improved.    Diffuse B-cell lymphoma  Currently undergoing radiation treatment during hospitalization.  Patient has been seen by Dr. Alen Blew oncology who recommends delaying second cycle of chemotherapy after cardiac issues have addressed.  Seizure disorder  Continue Lamictal and Keppra  Hypophosphatemia.  Phosphorus of 2.1 today. Replenish orally. Check in am.  VTE Prophylaxis: Xarelto  Code Status: Full code  Family Communication:  None today.    Status is: Inpatient  Remains inpatient appropriate because: status post cardio version-plan for repeat cardioversion on 06/27/2019, stepdown unit admission, patient with multiple comorbidities including lymphoma needing radiation, on milrinone drip.  Dispo: The patient is from: Home              Anticipated d/c is to: Home with home health              Anticipated d/c date is: 2 to 3 days.  For cardioversion today.  Follow cardiology recommendations.  Patient currently is not medically stable to d/c.  Consultants:  Oncology  Cardiology  Radiation oncology  Procedures:  PRBC  transfusion  DC cardioversion on 06/22/2019  Antibiotics/antifungals:   Fluconazole 5/30>6/7  Subjective:  Patient was seen and examined at bedside..  Denies any chest pain, dizziness, lightheadedness, shortness of breath.  Waiting for DC cardioversion today.  Objective: Vitals:   06/27/19 0500 06/27/19 0600  BP: (!) 96/51 (!) 97/51  Pulse: 83 90  Resp: (!) 29 (!) 25  Temp:    SpO2: 96% 97%    Intake/Output Summary (Last 24 hours) at 06/27/2019 0649 Last data filed at 06/27/2019 0529 Gross per 24 hour  Intake 54.39 ml  Output 950 ml  Net -895.61 ml   Filed Weights   06/25/19 0500 06/26/19 0500 06/27/19 0417  Weight: 80.4 kg 80 kg 80.5 kg   Body mass index is 24.07 kg/m.   Physical Exam: General:  thinly built, not in obvious distress, chronically ill HENT:   Mild pallor noted.  Oral mucosa is moist.  Chest:  Clear breath sounds.  Diminished breath sounds bilaterally. No crackles or wheezes.  CVS: S1 &S2 heard.  Systolic murmur, irregular rhythm Abdomen: Soft, nontender, nondistended.  Bowel sounds are heard.   Extremities: No cyanosis, clubbing but trace peripheral edema.  Peripheral pulses are palpable. Psych: Alert, awake and oriented, normal mood CNS:  No cranial nerve deficits.  Power equal in all extremities.   Skin: Warm and dry.  No rashes noted.   Data Review: I have personally reviewed the following laboratory data and studies,  CBC: Recent Labs  Lab 06/23/19 0500 06/24/19 0730 06/25/19 0500 06/26/19 0613 06/27/19 0430  WBC 5.0 4.3 6.1 6.9 5.2  HGB 9.1* 8.4* 8.3* 8.4* 7.6*  HCT 30.1* 28.1* 27.7* 27.9* 25.3*  MCV 85.8 86.5 86.6 88.3 86.9  PLT 217 208 246 277 620   Basic Metabolic Panel: Recent Labs  Lab 06/21/19 0532 06/21/19 0532 06/22/19 0500 06/22/19 0500 06/23/19 0703 06/24/19 0730 06/25/19 0500 06/26/19 0613 06/27/19 0430  NA 137   < > 137   < > 139 140 139 137 135  K 3.9   < > 3.4*   < > 3.0* 3.5 4.2 4.9 4.1  CL 101   < > 102   <  > 102 102 104 105 103  CO2 25   < > 26   < > 25 27 28 25 27   GLUCOSE 155*   < > 131*   < > 81 80 86 86 78  BUN 19   < > 19   < > 16 15 15 13 13   CREATININE 1.06   < > 1.06   < > 0.97 0.90 0.84 0.86 0.85  CALCIUM 7.7*   < > 7.4*   < > 7.2* 7.3* 7.6* 7.5* 7.5*  MG 1.8   < > 1.7   < > 1.8 1.9 1.9 2.0 2.3  PHOS 3.0  --  2.4*  --  3.2  --   --   --  2.1*   < > = values in this interval not displayed.   Liver Function Tests: Recent Labs  Lab 06/21/19 0532 06/22/19 0500  AST 36 40  ALT 32 35  ALKPHOS 66 61  BILITOT 0.6 0.4  PROT 4.7* 4.4*  ALBUMIN 2.0* 1.9*   No results for input(s): LIPASE, AMYLASE in the last 168 hours. No results for input(s): AMMONIA in the last 168 hours. Cardiac Enzymes: No results for  input(s): CKTOTAL, CKMB, CKMBINDEX, TROPONINI in the last 168 hours. BNP (last 3 results) No results for input(s): BNP in the last 8760 hours.  ProBNP (last 3 results) No results for input(s): PROBNP in the last 8760 hours.  CBG: Recent Labs  Lab 06/27/19 0407  GLUCAP 85   Recent Results (from the past 240 hour(s))  MRSA PCR Screening     Status: None   Collection Time: 06/18/19  5:43 PM   Specimen: Nasal Mucosa; Nasopharyngeal  Result Value Ref Range Status   MRSA by PCR NEGATIVE NEGATIVE Final    Comment:        The GeneXpert MRSA Assay (FDA approved for NASAL specimens only), is one component of a comprehensive MRSA colonization surveillance program. It is not intended to diagnose MRSA infection nor to guide or monitor treatment for MRSA infections. Performed at [facility], [place] [address] [place], [place] [address]      Studies: No results found.    Flora Lipps, MD  [facility] 06/27/2019

## 2019-06-27 NOTE — Progress Notes (Signed)
Mr. Plemmons Case discussed with Dr. Radford Pax and the plan for him to undergo cardioversion today.  If he remains hemodynamically stable over the next 48 hours the plan is to proceed with the next cycle of chemotherapy while inpatient on Friday June 29, 2019.  We will continue to monitor his progress in the interim please do not discharge with for Friday if he is medically stable to complete second cycle of chemotherapy.  Oncology nursing staff was alerted with these plans.

## 2019-06-27 NOTE — CV Procedure (Signed)
Electrical Cardioversion Procedure Note Tony Saunders [id number] January 07, 1956  Procedure: Electrical Cardioversion Indications:  Atrial Flutter  Procedure Details Consent: Risks of procedure as well as the alternatives and risks of each were explained to the (patient/caregiver).  Consent for procedure obtained. Time Out: Verified patient identification, verified procedure, site/side was marked, verified correct patient position, special equipment/implants available, medications/allergies/relevent history reviewed, required imaging and test results available.  Performed  Patient placed on cardiac monitor, pulse oximetry, supplemental oxygen as necessary.  Sedation given: propofol Pacer pads placed anterior and posterior chest.  Cardioverted 1 time(s).  Cardioverted at 150J.  Evaluation Findings: Post procedure EKG shows: NSR Complications: None Patient did tolerate procedure well.   Skeet Latch, MD 06/27/2019, 9:01 AM

## 2019-06-28 ENCOUNTER — Ambulatory Visit
Admission: RE | Admit: 2019-06-28 | Discharge: 2019-06-28 | Disposition: A | Discharge status: Home / Self Care | Payer: No Typology Code available for payment source | Source: Ambulatory Visit | Attending: Radiation Oncology | Admitting: Radiation Oncology

## 2019-06-28 ENCOUNTER — Encounter (HOSPITAL_COMMUNITY): Payer: Self-pay | Admitting: Cardiovascular Disease

## 2019-06-28 ENCOUNTER — Ambulatory Visit: Payer: No Typology Code available for payment source

## 2019-06-28 LAB — BASIC METABOLIC PANEL
Anion gap: 5 (ref 5–15)
BUN: 10 mg/dL (ref 8–23)
CO2: 23 mmol/L (ref 22–32)
Calcium: 6.7 mg/dL — ABNORMAL LOW (ref 8.9–10.3)
Chloride: 110 mmol/L (ref 98–111)
Creatinine, Ser: 0.66 mg/dL (ref 0.61–1.24)
GFR calc Af Amer: >60 mL/min (ref >60)
GFR calc non Af Amer: >60 mL/min (ref >60)
Glucose, Bld: 75 mg/dL (ref 70–99)
Potassium: 3.5 mmol/L (ref 3.5–5.1)
Sodium: 138 mmol/L (ref 135–145)

## 2019-06-28 LAB — CBC
HCT: 26.3 % — ABNORMAL LOW (ref 39.0–52.0)
Hemoglobin: 7.9 g/dL — ABNORMAL LOW (ref 13.0–17.0)
MCH: 26.2 pg (ref 26.0–34.0)
MCHC: 30 g/dL (ref 30.0–36.0)
MCV: 87.4 fL (ref 80.0–100.0)
Platelets: 257 10*3/uL (ref 150–400)
RBC: 3.01 MIL/uL — ABNORMAL LOW (ref 4.22–5.81)
RDW: 19.3 % — ABNORMAL HIGH (ref 11.5–15.5)
WBC: 5 10*3/uL (ref 4.0–10.5)
nRBC: 0 % (ref 0.0–0.2)

## 2019-06-28 LAB — MAGNESIUM: Magnesium: 2.1 mg/dL (ref 1.7–2.4)

## 2019-06-28 LAB — PHOSPHORUS: Phosphorus: 2.9 mg/dL (ref 2.5–4.6)

## 2019-06-28 MED ORDER — CARVEDILOL 3.125 MG PO TABS: 3.1250 mg | ORAL_TABLET | Freq: Two times a day (BID) | ORAL | Status: DC

## 2019-06-28 MED ORDER — POTASSIUM CHLORIDE CRYS ER 20 MEQ PO TBCR
40.0000 meq | EXTENDED_RELEASE_TABLET | Freq: Once | ORAL | Status: AC
  Administered 2019-06-28: 40 meq via ORAL
  Filled 2019-06-28: qty 2

## 2019-06-28 MED ORDER — K PHOS MONO-SOD PHOS DI & MONO 155-852-130 MG PO TABS
250.0000 mg | ORAL_TABLET | Freq: Three times a day (TID) | ORAL | Status: AC
  Administered 2019-06-28 – 2019-06-29 (×4): 250 mg via ORAL
  Filled 2019-06-28 (×4): qty 1

## 2019-06-28 NOTE — Progress Notes (Signed)
Patient tentatively due for chemotherapy treatment (CHOP with Rituximab) tomorrow. Patient has already received this treatment one time before as outpatient. Patient signed chemotherapy consent form and consent was placed in shadow chart.  Zandra Abts [facility]  06/28/2019  12:12 PM

## 2019-06-28 NOTE — Progress Notes (Signed)
Events noted.  He is status post DC cardioversion and currently in normal sinus rhythm.  Blood pressure appears to be stable and improved.  He is due for his second cycle of chemotherapy this week.  Given his hemodynamic stability and adequate counts, I am in favor proceeding with his chemotherapy second cycle while he is hospitalized.  Side effects of this chemotherapy were reiterated including nausea, vomiting, myelosuppression neutropenia and possible sepsis.  I have made arrangements for him to receive it on 06/29/2019.  Pharmacy as well as nursing staff of the oncology unit has been notified.  He will receive growth factor support as an outpatient on June 14 if he is discharged by that date.  I have no objections to discharge on 6/12 if if felt appropriate by the primary team.  I am in favor of transfusing 1 more unit of packed red cells in anticipation of potential discharge after chemotherapy.  We will continue to follow.

## 2019-06-28 NOTE — Progress Notes (Signed)
Nutrition Follow-up  DOCUMENTATION CODES:   Severe malnutrition in context of chronic illness  INTERVENTION:  - continue Boost Breeze once/day, Ensure Enlive BID, and Magic Cup BID. - continue to encourage PO intakes.   NUTRITION DIAGNOSIS:   Severe Malnutrition related to chronic illness, cancer and cancer related treatments as evidenced by moderate fat depletion, moderate muscle depletion, severe muscle depletion, edema. -ongoing  GOAL:   Patient will meet greater than or equal to 90% of their needs -unmet  MONITOR:   PO intake, Supplement acceptance, Labs, Weight trends, Skin  ASSESSMENT:   64 year old male referred for admission from outpatient clinic for hypotension and pancytopenia. Patient reports episodes of diarrhea, poor appetite, and weight loss since starting chemotherapy on 5/19 for newly diagnosed B-cell lymphoma. 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  Patient is currently out of the room to XRT treatment. Per chart review, he has been accepting Boost Breeze ~50% of the time offered and Ensure ~50% of the time offered.   He has had multiple periods of NPO status since RD assessment on 6/3. No meal intakes documented since RD assessment on 6/3.   Per chart review, weight continues to fluctuate slightly throughout admission (admit date: 5/28).  Per notes: - ischemic cardiomyopathy, severe mitral regurgitation--s/p cardioversion 6/4 and 6/9 - hypotension thought to be 2/2 cardiomyopathy and aflutter - anemia s/p 3 units PRBCs - oropharyngeal candidiasis 2/2 chemo--s/p diflucan and mycostatin orders - colonic ileus--improved - diffuse B-cell lymphoma--receiving XRT and likely start chemo on 6/11 - plan at time of d/c is for likely home with [place] reviewed; Ca: 6.7 mg/dl. Medications reviewed; 400 mg  mag-ox BID, 250 mg KPhos neutral x3 doses 6/10 and x1 dose 6/11, 40 mEq KCl x1 dose 6/10.   Diet Order:   Diet Order            Diet Heart Room service appropriate? Yes; Fluid consistency: Thin  Diet effective now                 EDUCATION NEEDS:   No education needs have been identified at this time  Skin:  Skin Assessment: Skin Integrity Issues: Skin Integrity Issues:: Stage I Stage I: sacrum  Last BM:  6/10--type 7  Height:   Ht Readings from Last 1 Encounters:  06/18/19 6' (1.829 m)    Weight:   Wt Readings from Last 1 Encounters:  06/28/19 79.3 kg    Estimated Nutritional Needs:  Kcal:  [id number] Protein:  120-135 Fluid:  >/= 2.3 L/day     Jarome Matin, MS, RD, LDN, CNSC Inpatient Clinical Dietitian RD pager # available in [facility]  After hours/weekend pager # available in [facility]

## 2019-06-28 NOTE — Progress Notes (Signed)
Patient's radiation treatment for 6/11 was originally scheduled for 11:30.  This will most likely be during the middle of the chemotherapy treatment.  Spoke with Harless Litten at [facility].  She spoke with radiation therapist and patient's treatment time has been moved to 14:30.  Zandra Abts [facility]  06/28/2019  3:43 PM

## 2019-06-28 NOTE — Progress Notes (Signed)
PROGRESS NOTE  Tony Saunders IOX:[id number] DOB: 04-Feb-1955 DOA: 06/15/2019 PCP: Tony Saunders, Tony Halsted, MD   LOS: 13 days   Brief narrative: As per HPI and previous provider,  64 year old male with diffuse B-cell lymphomaMay2021, started chemotherapy 5/19, presented for an outpatient port flushandradiation radiation simulation, found to be hypotensive and pancytopenic and was admitted to hospital for further evaluation and treatment.  During hospitalization, patient developed atrial flutter with rapid ventricular response and cardiology was consulted patient was then transferred to the stepdown unit for closer monitoring and treatment.  He was started on amiodarone drip and underwent cardioversion.  He briefly converted  again reverted in flutter. Patient underwent repeat cardioversion on 06/27/19. Patient was also started on milrinone drip during hospitalization she was subsequently discontinued.  Continued to receive radiation treatment for B-cell lymphoma.  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 Status post repeat cardioversion on 06/27/2019.  Now appears to be in sinus rhythm.   Cardiology on board.  On amiodarone 200 mg twice daily.  Continue  Brilinta and Xarelto.  Discontinued QT prolonging drugs including Zofran and Zoloft.  2D echocardiogram on 06/24/2019 with ejection fraction of 35 to 40% with severe MR.    Hypotension On midodrine.  Hypotension likely secondary to systolic cardiomyopathy with atrial flutter.  Blood pressure has improved after cardioversion.  On  metoprolol IV as needed for tachycardia. Was briefly on milrinone drip.Marland Kitchen No fever or leukocytosis.   Urinalysis with no evidence of infection.  Chest x-ray noted with effusion/atelectasis.   Pancytopenia secondary to chemotherapy, anemia.   Status post 3 units of packed RBC this admission.  Hemoglobin at 7.9 today.   On Brilinta and Xarelto.  Will monitor closely. Might need blood transfusion.  Oropharyngeal candidiasis secondary to chemotherapy completed Diflucan, nystatin.  Currently on regular consistency diet.   Hypokalemia, hypophosphatemia Hypokalemia resolved.  Replaced phosphorus yesterday.  Phosphorus of 2.9 today.  Colonic ileus  Improved.    Diffuse B-cell lymphoma  Currently undergoing radiation treatment during hospitalization.  Patient has been seen by Dr. Alen Saunders oncology who recommends second cycle of chemotherapy likely inpatient likely starting 06/29/2019  Seizure disorder  Continue Lamictal and Keppra   VTE Prophylaxis: Xarelto  Code Status: Full code  Family Communication:  I spoke with the patient's spouse Ms Tony Saunders on the phone and updated her about the clinical condition of the patient.  Status is: Inpatient  Remains inpatient appropriate because: status post cardio version, stepdown unit admission, patient with multiple comorbidities including lymphoma needing radiation, cardiology followup, plan for inpatient chemo on 06/29/2019  Dispo: The patient is from: Home              Anticipated d/c is to: Home with home health  Anticipated d/c date is: 2 to 3  days.   Follow cardiology recommendations.              Patient currently is not medically stable to d/c.  Consultants:  Oncology  Cardiology  Radiation oncology  Procedures:  PRBC transfusion  DC cardioversion on 06/22/2019 and 06/27/19  Antibiotics/antifungals:  . Fluconazole 5/30>6/7  Subjective: Today, patient was seen and examined at bedside.  Denies any interval complaints including dizziness lightheadedness shortness of breath chest pain.  Appears to be in normal sinus rhythm this morning blood pressure is slightly improved.  Objective: Vitals:   06/28/19 0400 06/28/19 0500  BP: (!) 89/59 93/63  Pulse: 94   Resp: (!) 21   Temp: (!) 97.4 F (36.3 C)   SpO2: 98%     Intake/Output Summary (Last 24 hours) at 06/28/2019 0701 Last data filed at 06/28/2019 0400 Gross per 24 hour  Intake 49.64 ml  Output 475 ml  Net -425.36 ml   Filed Weights   06/26/19 0500 06/27/19 0417 06/28/19 0600  Weight: 80 kg 80.5 kg 79.3 kg   Body mass index is 23.71 kg/m.   Physical Exam: General:  thinly built, not in obvious distress, chronically ill HENT:  pallor noted.  Oral mucosa is moist.  Chest:    Diminished breath sounds bilaterally.  CVS: S1 &S2 heard.  Systolic murmur,regular rhythm Abdomen: Soft, nontender, nondistended.  Bowel sounds are heard.   Extremities: No cyanosis, clubbing but trace peripheral edema.  Psych: Alert, awake and oriented, normal mood CNS:  No cranial nerve deficits.  Power equal in all extremities.   Skin: Warm and dry.   Data Review: I have personally reviewed the following laboratory data and studies,  CBC: Recent Labs  Lab 06/23/19 0500 06/24/19 0730 06/25/19 0500 06/26/19 0613 06/27/19 0430  WBC 5.0 4.3 6.1 6.9 5.2  HGB 9.1* 8.4* 8.3* 8.4* 7.6*  HCT 30.1* 28.1* 27.7* 27.9* 25.3*  MCV 85.8 86.5 86.6 88.3 86.9  PLT 217 208 246 277 110   Basic Metabolic Panel: Recent Labs  Lab 06/22/19 0500 06/22/19 0500 06/23/19 0703 06/24/19 0730  06/25/19 0500 06/26/19 0613 06/27/19 0430  NA 137   < > 139 140 139 137 135  K 3.4*   < > 3.0* 3.5 4.2 4.9 4.1  CL 102   < > 102 102 104 105 103  CO2 26   < > 25 27 28 25 27   GLUCOSE 131*   < > 81 80 86 86 78  BUN 19   < > 16 15 15 13 13   CREATININE 1.06   < > 0.97 0.90 0.84 0.86 0.85  CALCIUM 7.4*   < > 7.2* 7.3* 7.6* 7.5* 7.5*  MG 1.7   < > 1.8 1.9 1.9 2.0 2.3  PHOS 2.4*  --  3.2  --   --   --  2.1*   < > = values in this interval not displayed.   Liver Function Tests: Recent Labs  Lab 06/22/19 0500  AST 40  ALT 35  ALKPHOS 61  BILITOT 0.4  PROT 4.4*  ALBUMIN 1.9*   No results for input(s): LIPASE, AMYLASE in the last 168 hours. No results for input(s): AMMONIA in the last 168 hours. Cardiac Enzymes: No results for input(s): CKTOTAL, CKMB, CKMBINDEX, TROPONINI in the last 168 hours. BNP (last 3 results) No results for input(s): BNP in the last 8760 hours.  ProBNP (last 3 results) No results for input(s): PROBNP in the  last 8760 hours.  CBG: Recent Labs  Lab 06/27/19 0407  GLUCAP 85   Recent Results (from the past 240 hour(s))  MRSA PCR Screening     Status: None   Collection Time: 06/18/19  5:43 PM   Specimen: Nasal Mucosa; Nasopharyngeal  Result Value Ref Range Status   MRSA by PCR NEGATIVE NEGATIVE Final    Comment:        The GeneXpert MRSA Assay (FDA approved for NASAL specimens only), is one component of a comprehensive MRSA colonization surveillance program. It is not intended to diagnose MRSA infection nor to guide or monitor treatment for MRSA infections. Performed at [facility], [place] [address], [place], [place] [address]      Studies: No results found.    Flora Lipps, MD  [facility] 06/28/2019

## 2019-06-28 NOTE — Progress Notes (Signed)
Physical Therapy Treatment Patient Details Name: Tony Saunders MRN: [id number] DOB: 1955/07/16 Today's Date: 06/28/2019    History of Present Illness Pt to ED 2* weakness and admitted with severe pancytopenia 2* chemo and increasing L LE edema 2* ongoing Lymphoma.  Pt with hx of MI, seizure disorder, CHF, PAF - pt underwent Cardioversion 06/22/19    PT Comments    The patient presents with increased weakness than previous encounter. Patient reports left foot very painful, decreased tolerance to WB.Patient may need additional DME  To return home: WC/and cushion. Continue  Progressive mobility.   Follow Up Recommendations  Home health PT;Supervision/Assistance - 24 hour     Equipment Recommendations  3in1 (PT);Wheelchair cushion (measurements PT);Wheelchair (measurements PT)    Recommendations for Other Services       Precautions / Restrictions Precautions Precautions: Fall Precaution Comments: very frail, painful left foot    Mobility  Bed Mobility Overal bed mobility: Needs Assistance Bed Mobility: Supine to Sit     Supine to sit: Min assist     General bed mobility comments: assist left leg to prevent from bumping it  Transfers Overall transfer level: Needs assistance   Transfers: Sit to/from Stand;Stand Pivot Transfers           General transfer comment: cues for LE management and use of UEs to self assist.  Physical assist to bring wt up in a " bear hug, assist to move the left leg to take a step.and fwd and to balance in initial standing  Ambulation/Gait                 Stairs             Wheelchair Mobility    Modified Rankin (Stroke Patients Only)       Balance   Sitting-balance support: Feet supported;Bilateral upper extremity supported Sitting balance-Leahy Scale: Fair     Standing balance support: Bilateral upper extremity supported Standing balance-Leahy Scale: Poor                              Cognition  Arousal/Alertness: Awake/alert Behavior During Therapy: WFL for tasks assessed/performed;Flat affect Overall Cognitive Status: Within Functional Limits for tasks assessed                                        Exercises      General Comments        Pertinent Vitals/Pain Pain Assessment: Faces Faces Pain Scale: Hurts even more Pain Location: left foot Pain Descriptors / Indicators: Discomfort Pain Intervention(s): Monitored during session;Limited activity within patient's tolerance    Home Living                      Prior Function            PT Goals (current goals can now be found in the care plan section) Progress towards PT goals: Progressing toward goals    Frequency    Min 3X/week      PT Plan Current plan remains appropriate    Co-evaluation              AM-PAC PT "6 Clicks" Mobility   Outcome Measure  Help needed turning from your back to your side while in a flat bed without using bedrails?: A Lot Help needed moving from lying  on your back to sitting on the side of a flat bed without using bedrails?: A Lot Help needed moving to and from a bed to a chair (including a wheelchair)?: Total Help needed standing up from a chair using your arms (e.g., wheelchair or bedside chair)?: Total Help needed to walk in hospital room?: Total Help needed climbing 3-5 steps with a railing? : Total 6 Click Score: 8    End of Session Equipment Utilized During Treatment: Gait belt Activity Tolerance: Patient limited by fatigue Patient left: in chair;with call bell/phone within reach;with nursing/sitter in room Nurse Communication: Mobility status PT Visit Diagnosis: Difficulty in walking, not elsewhere classified (R26.2);Muscle weakness (generalized) (M62.81)     Time: 1225-1300 PT Time Calculation (min) (ACUTE ONLY): 35 min  Charges:  $Therapeutic Activity: 23-37 mins             Tresa Endo PT Acute Rehabilitation Services Pager  [phone] Office [phone]      Claretha Cooper 06/28/2019, 3:15 PM

## 2019-06-28 NOTE — Progress Notes (Signed)
[facility] Therapy Treatment Record Phone [phone]   Radiation Therapy was administered to Tony Saunders on: 06/28/2019  11:23 AM and was treatment # 7 out of a planned course of 10 treatments.  Radiation Treatment  1). Beam photons with 6-10 energy  2). Brachytherapy None  3). Stereotactic Radiosurgery None  4). Other Radiation None     Taylin Mans A Egidio Lofgren, RT (T)

## 2019-06-28 NOTE — Progress Notes (Addendum)
Progress Note  Patient Name: Tony Saunders Date of Encounter: 06/28/2019  Primary Cardiologist: Glenetta Hew, MD   Subjective   Maintaining NSR after DCCV yesterday.  Denies any chest pain, SOB, palpitations.   Inpatient Medications    Scheduled Meds: . allopurinol  300 mg Oral Daily  . amiodarone  200 mg Oral BID  . calcium carbonate  1 tablet Oral TID  . Chlorhexidine Gluconate Cloth  6 each Topical Daily  . feeding supplement  1 Container Oral Q breakfast  . feeding supplement (ENSURE ENLIVE)  237 mL Oral BID BM  . lamoTRIgine  100 mg Oral BID  . levETIRAcetam  750 mg Oral BID  . magnesium oxide  400 mg Oral BID  . mouth rinse  15 mL Mouth Rinse BID  . midodrine  2.5 mg Oral TID WC  . mirtazapine  15 mg Oral QHS  . phosphorus  250 mg Oral TID  . rivaroxaban  20 mg Oral Q supper  . rosuvastatin  5 mg Oral QHS  . sodium chloride flush  3 mL Intravenous Q12H  . sodium chloride flush  3 mL Intravenous Q12H  . ticagrelor  90 mg Oral BID   Continuous Infusions: . sodium chloride     PRN Meds: sodium chloride, acetaminophen **OR** acetaminophen, metoprolol tartrate, morphine injection, nitroGLYCERIN, pantoprazole, sodium chloride flush, sodium chloride flush   Vital Signs    Vitals:   06/28/19 0700 06/28/19 0800 06/28/19 0900 06/28/19 1000  BP:  90/67 110/70 116/72  Pulse: 95 97 (!) 102 (!) 101  Resp: (!) 24 (!) 25 (!) 33 (!) 30  Temp:  (!) 97.5 F (36.4 C)    TempSrc:  Oral    SpO2: 97% 98% 100% 99%  Weight:      Height:        Intake/Output Summary (Last 24 hours) at 06/28/2019 1052 Last data filed at 06/28/2019 0400 Gross per 24 hour  Intake 49.64 ml  Output 475 ml  Net -425.36 ml   Last 3 Weights 06/28/2019 06/27/2019 06/26/2019  Weight (lbs) 174 lb 13.2 oz 177 lb 7.5 oz 176 lb 5.9 oz  Weight (kg) 79.3 kg 80.5 kg 80 kg      Telemetry    NSR- Personally Reviewed  ECG    No new EKG to review  - Personally Reviewed  Physical Exam   GEN:  Well nourished, well developed in no acute distress HEENT: Normal NECK: No JVD; No carotid bruits LYMPHATICS: No lymphadenopathy CARDIAC:RRR, no murmurs, rubs, gallops RESPIRATORY:  Clear to auscultation without rales, wheezing or rhonchi  ABDOMEN: Soft, non-tender, non-distended MUSCULOSKELETAL:  No edema; No deformity  SKIN: Warm and dry NEUROLOGIC:  Alert and oriented x 3 PSYCHIATRIC:  Normal affect    Labs    High Sensitivity Troponin:   No results for input(s): TROPONINIHS in the last 720 hours.    Chemistry Recent Labs  Lab 06/22/19 0500 06/23/19 0703 06/26/19 0613 06/27/19 0430 06/28/19 0909  NA 137   < > 137 135 138  K 3.4*   < > 4.9 4.1 3.5  CL 102   < > 105 103 110  CO2 26   < > 25 27 23   GLUCOSE 131*   < > 86 78 75  BUN 19   < > 13 13 10   CREATININE 1.06   < > 0.86 0.85 0.66  CALCIUM 7.4*   < > 7.5* 7.5* 6.7*  PROT 4.4*  --   --   --   --  ALBUMIN 1.9*  --   --   --   --   AST 40  --   --   --   --   ALT 35  --   --   --   --   ALKPHOS 61  --   --   --   --   BILITOT 0.4  --   --   --   --   GFRNONAA >60   < > >60 >60 >60  GFRAA >60   < > >60 >60 >60  ANIONGAP 9   < > 7 5 5    < > = values in this interval not displayed.     Hematology Recent Labs  Lab 06/26/19 0613 06/27/19 0430 06/28/19 0909  WBC 6.9 5.2 5.0  RBC 3.16* 2.91* 3.01*  HGB 8.4* 7.6* 7.9*  HCT 27.9* 25.3* 26.3*  MCV 88.3 86.9 87.4  MCH 26.6 26.1 26.2  MCHC 30.1 30.0 30.0  RDW 18.7* 19.0* 19.3*  PLT 277 261 257    BNPNo results for input(s): BNP, PROBNP in the last 168 hours.   Lactic Acid, Venous    Component Value Date/Time   LATICACIDVEN 1.2 06/26/2019 2145   Magnesium  Date Value Ref Range Status  06/28/2019 2.1 1.7 - 2.4 mg/dL Final    Comment:    Performed at [facility], [place] [address], [place], [place] [address]  06/27/2019 2.3 1.7 - 2.4 mg/dL Final    Comment:    Performed at [facility], [place] [address]  [place], [place] [address]  06/26/2019 2.0 1.7 - 2.4 mg/dL Final    Comment:    Performed at [facility], [place] [address] [place], [place] [address]   Phosphorus  Date Value Ref Range Status  06/28/2019 2.9 2.5 - 4.6 mg/dL Final    Comment:    Performed at [facility], [place] [address], [place],  [address]      Radiology    No results found.  Cardiac Studies   Echocardiogram 05/14/2019: Impressions: 1. Compared to echo in March 2021, no significant change in LVEF or MR.  Pt is now in atrial fibrillation.  2. LVEF is severely depressed with severe hypokinesis/akinesis of the  base/mid inferior, inferosepal, infer walls and apical walls; hypokinesis  elsewhere . Left ventricular ejection fraction, by estimation, is 30%. The  left ventricle has severely  decreased function. The left ventricular internal cavity size was mildly  dilated. There is mild left ventricular hypertrophy. Left ventricular  diastolic parameters are indeterminate.  3. Right ventricular systolic function is normal. The right ventricular  size is normal. There is normal pulmonary artery systolic pressure.  4. Left atrial size was mildly dilated.  5. MR is directed posteriorly into LA, likely due to tethering of  posterior leaflet. Severe mitral valve regurgitation.  6. The aortic valve is tricuspid. Aortic valve regurgitation is not  visualized. Mild aortic valve sclerosis is present, with no evidence of  aortic valve stenosis.  7. The inferior vena cava is normal in size with greater than 50%  respiratory variability, suggesting right atrial pressure of 3 mmHg.   Patient Profile     64 y.o. male with a history of CAD with recent STEMI in 03/2019 s/p DES x2 to RCA and DES to RPDA and then staged PCI with DES to LAD, DES x2 to CX, and DES to OM3; chronic systolic CHF/ ischemic cardiomyopathy with EF of 30% on  Echo in 04/2019; severe mitral regurgitation, paroxsymal  atrial fibrillation/flutter on Amiodarone and Xarelto; COPD; seizure disorder; and diffuse large B-cell lymphoma. Patient admitted on 06/15/2019 for hypotension and pancytopnenia secondary to chemotherapy. Cardiology consulted on 06/18/2019 for further evaluation of atrial fibrillation with RVR at the request of Dr. Louanne Belton.   Assessment & Plan    1.  Atrial Fibrillation/Flutter with RVR  -s/p successful DCCV last week but went back into atrial fultter with variable block in the 80's -Amio increased to 200mg  BID  to reload  -s/p successful DCCV yesterday and maintaining NSR -continue Xarelto 20 mg qd.  -recommend avoiding all QT prolonging drugs since he needs to be on Amio (he is on zofran, Zoloft)>>QTc yesterday 452ms by my recalculation -will keep on Amio 200mg  BID for 2 weeks and then decrease to 200mg  daily -will need followup with Dr. Haroldine Laws in 2 weeks outpt  2.  Acute on Chronic Combined CHF -EF 30% on TTE 4/26.  -Diuresed with IV lasix initially but then held due to soft BP   -he put out 475cc yesterday ? Accuracy and is net neg 3.5L -minimal LE edema, suspect this is multifactorial with low albumin (1.9) and obstruction from lymphoma contributing  -repeat co-ox yesterday 88 after DDCV and IV Milrinone has been stopped -BP very stable at 116/30mmHg -creatinine stable at 0.66. -his CVP yesterday was 45mmHg so would not restart Lasix for now -BB too soft to add BB  3.  CAD   - s/p STEMI in 03/2019 treated with DES x2 to RCA and DES to RPDA and then staged PCI with DES to LAD, DES x2 to CX, and DES to OM3. -on Brilinta, no ASA 2nd Xarelto -continue statin -was on Carvedilol that was held due to soft BP -BP still too soft to add BB  4.  Severe Mitral Regurgitation - see echo results above, is likely due to tethering of posterior leaflet - could consider Mitraclip eval once treated for lymphoma  5.  Hypotensive - on midodrine 2.5 mg tid - likely cardiogenic due to LV  dysfunction and severe MR - BP improved on low dose IV Milrinone  - hypotension has now resolved after DCCV to NSR and is off Milrinone  6.  Electrolyte Abnormalities - K+ nadir 2.3 on 05/29, rec'd IV/PO supplement - K+today 3.5 - need to replete today with Kdur 33meq once - repeat BMET in am - Mg nadir 1.6, s/p IV supp  - Mag today 2.1 - continue Mag oxide to 400mg  BID  7.  Recently Diagnosed High-Grade Lymphoma  - extensive retroperitoneal, pelvic, and inguinal lymphadenopathy. There is also involvement of the spleen and near complete infiltration of the right kidney.  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  -Suspicious for infiltrating disease of the right iliac and left pubic bones.  -chemo started 05/19, now on hold -2D echo 6/6 with no pericardial effusion -discussed with Dr. Alen Blew and hopefully will be able to get second cycle of chemo (CHOP with Rituximab) once   hemodynamics stabilize after cardioversion -he is ok with transferring him to [facility] to stabilize cardiac issues and they will follow along.   -will arrange transfer to [facility] to [facility] service and AHF to follow  8.  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   Medication Recommendations:  Amio 200mg  BID until seen back by Dr. Haroldine Laws,  proamatine 2.5mg   TID, Xarelto 20mg  daily, Crestor 5mg  daily, Brilinta 90mg  BID Other recommendations (labs, testing, etc):  none Follow up as an outpatient:  Dr. Haroldine Laws in 7-10 days  For questions or updates, please contact [facility] Please consult [email or web address] for contact info under      Signed, [name], MD  06/28/2019, 10:52 AM

## 2019-06-29 ENCOUNTER — Other Ambulatory Visit: Payer: Self-pay | Admitting: Oncology

## 2019-06-29 ENCOUNTER — Inpatient Hospital Stay (HOSPITAL_COMMUNITY): Payer: No Typology Code available for payment source

## 2019-06-29 ENCOUNTER — Ambulatory Visit
Admission: RE | Admit: 2019-06-29 | Discharge: 2019-06-29 | Disposition: A | Discharge status: Home / Self Care | Payer: No Typology Code available for payment source | Source: Ambulatory Visit | Attending: Radiation Oncology | Admitting: Radiation Oncology

## 2019-06-29 ENCOUNTER — Telehealth: Payer: Self-pay | Admitting: Oncology

## 2019-06-29 ENCOUNTER — Other Ambulatory Visit: Payer: Self-pay

## 2019-06-29 DIAGNOSIS — M79605 Pain in left leg: Secondary | ICD-10-CM

## 2019-06-29 LAB — CBC
HCT: 25.3 % — ABNORMAL LOW (ref 39.0–52.0)
Hemoglobin: 7.7 g/dL — ABNORMAL LOW (ref 13.0–17.0)
MCH: 26.3 pg (ref 26.0–34.0)
MCHC: 30.4 g/dL (ref 30.0–36.0)
MCV: 86.3 fL (ref 80.0–100.0)
Platelets: 259 10*3/uL (ref 150–400)
RBC: 2.93 MIL/uL — ABNORMAL LOW (ref 4.22–5.81)
RDW: 19.5 % — ABNORMAL HIGH (ref 11.5–15.5)
WBC: 5 10*3/uL (ref 4.0–10.5)
nRBC: 0 % (ref 0.0–0.2)

## 2019-06-29 LAB — COMPREHENSIVE METABOLIC PANEL
ALT: 25 U/L (ref 0–44)
AST: 25 U/L (ref 15–41)
Albumin: 2.1 g/dL — ABNORMAL LOW (ref 3.5–5.0)
Alkaline Phosphatase: 74 U/L (ref 38–126)
Anion gap: 8 (ref 5–15)
BUN: 11 mg/dL (ref 8–23)
CO2: 26 mmol/L (ref 22–32)
Calcium: 7.6 mg/dL — ABNORMAL LOW (ref 8.9–10.3)
Chloride: 103 mmol/L (ref 98–111)
Creatinine, Ser: 0.78 mg/dL (ref 0.61–1.24)
GFR calc Af Amer: >60 mL/min (ref >60)
GFR calc non Af Amer: >60 mL/min (ref >60)
Glucose, Bld: 84 mg/dL (ref 70–99)
Potassium: 3.9 mmol/L (ref 3.5–5.1)
Sodium: 137 mmol/L (ref 135–145)
Total Bilirubin: 0.5 mg/dL (ref 0.3–1.2)
Total Protein: 4.4 g/dL — ABNORMAL LOW (ref 6.5–8.1)

## 2019-06-29 LAB — MAGNESIUM: Magnesium: 2.3 mg/dL (ref 1.7–2.4)

## 2019-06-29 LAB — PHOSPHORUS: Phosphorus: 3.5 mg/dL (ref 2.5–4.6)

## 2019-06-29 MED ORDER — PROCHLORPERAZINE MALEATE 10 MG PO TABS: 10.0000 mg | ORAL_TABLET | Freq: Four times a day (QID) | ORAL | Status: DC | PRN

## 2019-06-29 MED ORDER — SODIUM CHLORIDE 0.9 % IV SOLN
750.0000 mg/m2 | Freq: Once | INTRAVENOUS | Status: AC
  Administered 2019-06-29: 1540 mg via INTRAVENOUS
  Filled 2019-06-29: qty 77

## 2019-06-29 MED ORDER — SODIUM CHLORIDE 0.9 % IV SOLN: Freq: Once | INTRAVENOUS | Status: AC

## 2019-06-29 MED ORDER — DIPHENHYDRAMINE HCL 50 MG PO CAPS
50.0000 mg | ORAL_CAPSULE | Freq: Once | ORAL | Status: AC
  Administered 2019-06-29: 50 mg via ORAL
  Filled 2019-06-29: qty 1

## 2019-06-29 MED ORDER — PALONOSETRON HCL INJECTION 0.25 MG/5ML
0.2500 mg | Freq: Once | INTRAVENOUS | Status: AC
  Administered 2019-06-29: 0.25 mg via INTRAVENOUS
  Filled 2019-06-29: qty 5

## 2019-06-29 MED ORDER — DOXORUBICIN HCL CHEMO IV INJECTION 2 MG/ML
50.0000 mg/m2 | Freq: Once | INTRAVENOUS | Status: AC
  Administered 2019-06-29: 102 mg via INTRAVENOUS
  Filled 2019-06-29: qty 51

## 2019-06-29 MED ORDER — SODIUM CHLORIDE 0.9 % IV SOLN
375.0000 mg/m2 | Freq: Once | INTRAVENOUS | Status: DC
  Filled 2019-06-29: qty 80

## 2019-06-29 MED ORDER — ACETAMINOPHEN 325 MG PO TABS
650.0000 mg | ORAL_TABLET | Freq: Once | ORAL | Status: AC
  Administered 2019-06-29: 650 mg via ORAL
  Filled 2019-06-29: qty 2

## 2019-06-29 MED ORDER — PREDNISONE 20 MG PO TABS
100.0000 mg | ORAL_TABLET | Freq: Every day | ORAL | Status: DC
  Administered 2019-06-29 – 2019-06-30 (×2): 100 mg via ORAL
  Filled 2019-06-29 (×2): qty 5

## 2019-06-29 MED ORDER — SODIUM CHLORIDE 0.9 % IV SOLN
150.0000 mg | Freq: Once | INTRAVENOUS | Status: AC
  Administered 2019-06-29: 150 mg via INTRAVENOUS
  Filled 2019-06-29: qty 5

## 2019-06-29 MED ORDER — SODIUM CHLORIDE 0.9 % IV SOLN
375.0000 mg/m2 | Freq: Once | INTRAVENOUS | Status: AC
  Administered 2019-06-29: 800 mg via INTRAVENOUS
  Filled 2019-06-29: qty 80

## 2019-06-29 MED ORDER — VINCRISTINE SULFATE CHEMO INJECTION 1 MG/ML
2.0000 mg | Freq: Once | INTRAVENOUS | Status: AC
  Administered 2019-06-29: 2 mg via INTRAVENOUS
  Filled 2019-06-29: qty 2

## 2019-06-29 MED ORDER — SODIUM CHLORIDE 0.9 % IV SOLN
10.0000 mg | Freq: Once | INTRAVENOUS | Status: AC
  Administered 2019-06-29: 10 mg via INTRAVENOUS
  Filled 2019-06-29: qty 1

## 2019-06-29 NOTE — Progress Notes (Signed)
Laboratory data from this morning reviewed.  His a hematological parameters are adequate with normal white cell count and platelet count.  His kidney function and liver function test all within normal range at this time.  He is hemodynamically stable and should be ready to proceed with chemotherapy despite a hemoglobin of 7.7.  I recommend transfusion of packed red cells prior to discharge as we anticipate may be slight drop in his hemoglobin after chemotherapy the next week or 2.

## 2019-06-29 NOTE — Progress Notes (Signed)
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  During hospitalization, patient developed atrial flutter with rapid ventricular response and cardiology was consulted patient was then transferred to the stepdown unit for closer monitoring and treatment.  He was started on amiodarone drip and underwent cardioversion.  He briefly converted  again reverted in flutter. Patient underwent repeat cardioversion on 06/27/19. Patient was also started on milrinone drip during hospitalization - was subsequently discontinued. Continued to receive radiation treatment for B-cell lymphoma. 6/11-inpatient chemo rituximab infusion    Subjective:  Alert awake no new complaints. Seen this morning alert awake appears ill but not in acute distress. Getting his chemo preop per Rituxan chemo today.  H/h 7.7 from 7/9, 7.6- stable Bmp stable, afebrile BP soft  Assessment & Plan:  Atrial fibrillation/flutter with RVR: s/p DCCV last week but went back into a flutter with variable block in the 80s.  Again DCCV 06/27/19-maintaining normal sinus rhythm, on Xarelto 20 mg for anticoagulation, on amiodarone plan is to continue to have milligrams twice daily for 2 weeks then 20 mg daily and follow-up with Dr. Haroldine Laws in 2 weeks.  Cardiology signed off.  Avoid QT prolonging medication.  Acute on chronic combined systolic/diastolic congestive heart failure: EF 30% on 4/26.  Treated with IV Lasix initially but held due to soft BP.  Blood pressure soft but has been stable, unable to add beta-blocker due to soft blood pressure.  Has  chronic left more than right lower extremity edema due to lymphoma/multifactorial/low albumin.  Follow-up with cardiology as outpatient.  Colonic ileus: Improved  Oropharyngeal candidiasis due to chemotherapy: Completed Diflucan, nystatin.  Continue regular consistency diet.  Ischemic cardiomyopathy/CAD s/p NSTEMI in March/21 with DES x2 to RCA and DES to RPDA continue staged PCI with DES to LAD, DES x2 to Cx, DES to OM3: Stable, no chest pain.  Continue home Brilinta, Xarelto, no aspirin as he is on Xarelto, continue statin, carvedilol discontinued due to soft BP  Severe MR likely due to tethering of post  Leaflet per cardio-could consider MitraClip eval once treated for lymphoma  Hypotension overall improving continue midodrine 2.5 mg 3 times daily.  Was on IV milrinone and weaned off.  Likely cardiogenic due to LV dysfunction and severe MR.  Hypotension overall is stable after DCCV.  Recently diagnosed high-grade lymphoma: Follows with Dr. Julien Nordmann, getting second cycle of rituximab infusion and also radiation therapy.  Pancytopenia/[place] 2/2 chemotherapy/lymphoma-so far 3 units PRBC transfused.  Hemoglobin at 7.7 g, hematology recommending PRBC transfusion given that patient is getting chemo and will likely drop in next 1 to 2 weeks.  Patient normally follows IV fluids today with attempt transfusion tomorrow after H/H Monitor CBC due to chemo again. Recent Labs  Lab 06/27/19 0430 06/28/19 0909 06/29/19 0500  HGB 7.6* 7.9* 7.7*  HCT 25.3* 26.3* 25.3*  WBC 5.2 5.0 5.0  PLT 261 257 259   Seizure disorder continue Lamictal and Keppra  Chronic b/l Lower Extremities edema lt > rt- pt reporst it has been like this since his cancer. LLE is cooler than rt- pulse dopplerable per nursing.  Ordered Arterial duplex.  Patient is on Xarelto and Brilinta.  DVT  prophylaxis:Xarelto Code Status: FULL  Family Communication: plan of care discussed with patient and his wife at bedside.  Status is:  Inpatient Remains inpatient appropriate because:For ongoing chemo infusion, monitoring of CBC post chemo  Dispo: The patient is from: Home              Anticipated d/c is to: Home with home health, reluctant for skilled nursing facility.              Anticipated d/c date is: 1- 2 days              Patient currently is not medically stable to d/c.   Diet Order            Diet Heart Room service appropriate? Yes; Fluid consistency: Thin  Diet effective now                 Nutrition Problem: Severe Malnutrition Etiology: chronic illness, cancer and cancer related treatments Signs/Symptoms: moderate fat depletion, moderate muscle depletion, severe muscle depletion, edema Interventions: MVI, Ensure Enlive (each supplement provides 350kcal and 20 grams of protein), Other (Comment), Boost Breeze (Unjury) Body mass index is 24.52 kg/m. Pressure Ulcer: Pressure Injury 06/15/19 Sacrum Stage 1 -  Intact skin with non-blanchable redness of a localized area usually over a bony prominence. (Active)  06/15/19 1848  Location: Sacrum  Location Orientation:   Staging: Stage 1 -  Intact skin with non-blanchable redness of a localized area usually over a bony prominence.  Wound Description (Comments):   Present on Admission: Yes    Consultants:see note  Procedures:see note Microbiology:see note  Medications: Scheduled Meds: . allopurinol  300 mg Oral Daily  . amiodarone  200 mg Oral BID  . calcium carbonate  1 tablet Oral TID  . Chlorhexidine Gluconate Cloth  6 each Topical Daily  . cyclophosphamide  750 mg/m2 (Treatment Plan Recorded) Intravenous Once  . DOXOrubicin  50 mg/m2 (Treatment Plan Recorded) Intravenous Once  . feeding supplement  1 Container Oral Q breakfast  . feeding supplement (ENSURE ENLIVE)  237 mL Oral BID BM  . lamoTRIgine  100 mg Oral BID  . levETIRAcetam  750 mg Oral BID  . magnesium oxide  400 mg Oral BID  . mouth rinse  15 mL Mouth Rinse BID  . midodrine  2.5 mg  Oral TID WC  . mirtazapine  15 mg Oral QHS  . phosphorus  250 mg Oral TID  . riTUXimab (RITUXAN) IV infusion  375 mg/m2 (Treatment Plan Recorded) Intravenous Once  . rivaroxaban  20 mg Oral Q supper  . rosuvastatin  5 mg Oral QHS  . sodium chloride flush  3 mL Intravenous Q12H  . sodium chloride flush  3 mL Intravenous Q12H  . ticagrelor  90 mg Oral BID  . vinCRIStine (ONCOVIN) CHEMO IV infusion  2 mg Intravenous Once   Continuous Infusions: . sodium chloride      Antimicrobials: Anti-infectives (From admission, onward)   Start     Dose/Rate Route Frequency Ordered Stop   06/17/19 1000  fluconazole (DIFLUCAN) tablet 100 mg  Status:  Discontinued        100 mg Oral Daily 06/16/19 0915 06/25/19 0922   06/16/19 1000  fluconazole (DIFLUCAN) IVPB 200 mg        200 mg 100 mL/hr over 60 Minutes Intravenous  Once 06/16/19 0912 06/16/19 1121       Objective: Vitals: Today's Vitals   06/29/19 0100 06/29/19 0400 06/29/19 0500 06/29/19 0800  BP:  101/69 90/65  (!) 92/51  Pulse: 97 97  85  Resp: (!) 39 (!) 22  (!) 31  Temp:    (!) 97.4 F (36.3 C)  TempSrc:    Oral  SpO2: 94% 93%  97%  Weight:   82 kg   Height:      PainSc:  Asleep  0-No pain    Intake/Output Summary (Last 24 hours) at 06/29/2019 0949 Last data filed at 06/29/2019 0946 Gross per 24 hour  Intake 242.56 ml  Output 200 ml  Net 42.56 ml   Filed Weights   06/27/19 0417 06/28/19 0600 06/29/19 0500  Weight: 80.5 kg 79.3 kg 82 kg   Weight change: 2.7 kg   Intake/Output from previous day: 06/10 0701 - 06/11 0700 In: -  Out: 200 [Urine:200] Intake/Output this shift: Total I/O In: 242.6 [I.V.:5.1; IV Piggyback:237.4] Out: -   Examination:  General exam: AAOx3, weak, frail. HEENT:Oral mucosa moist, Ear/Nose WNL grossly,dentition normal. Respiratory system: bilaterally basal crackles,no wheezing,no use of accessory muscle, non tender. Cardiovascular system: S1 & S2 +, regular, No JVD. Gastrointestinal  system: Abdomen soft, NT,ND, BS+. Nervous System:Alert, awake, moving extremities and grossly nonfocal Extremities: b/l LE edema lt > rt distal peripheral pulses palpable.  Skin: No rashes,no icterus. MSK: Normal muscle bulk,tone, power  Data Reviewed: I have personally reviewed following labs and imaging studies CBC: Recent Labs  Lab 06/25/19 0500 06/26/19 0613 06/27/19 0430 06/28/19 0909 06/29/19 0500  WBC 6.1 6.9 5.2 5.0 5.0  HGB 8.3* 8.4* 7.6* 7.9* 7.7*  HCT 27.7* 27.9* 25.3* 26.3* 25.3*  MCV 86.6 88.3 86.9 87.4 86.3  PLT 246 277 261 257 696   Basic Metabolic Panel: Recent Labs  Lab 06/23/19 0703 06/24/19 0730 06/25/19 0500 06/26/19 0613 06/27/19 0430 06/28/19 0909 06/29/19 0500  NA 139   < > 139 137 135 138 137  K 3.0*   < > 4.2 4.9 4.1 3.5 3.9  CL 102   < > 104 105 103 110 103  CO2 25   < > 28 25 27 23 26   GLUCOSE 81   < > 86 86 78 75 84  BUN 16   < > 15 13 13 10 11   CREATININE 0.97   < > 0.84 0.86 0.85 0.66 0.78  CALCIUM 7.2*   < > 7.6* 7.5* 7.5* 6.7* 7.6*  MG 1.8   < > 1.9 2.0 2.3 2.1 2.3  PHOS 3.2  --   --   --  2.1* 2.9 3.5   < > = values in this interval not displayed.   GFR: Estimated Creatinine Clearance: 105.1 mL/min (by C-G formula based on SCr of 0.78 mg/dL). Liver Function Tests: Recent Labs  Lab 06/29/19 0500  AST 25  ALT 25  ALKPHOS 74  BILITOT 0.5  PROT 4.4*  ALBUMIN 2.1*   No results for input(s): LIPASE, AMYLASE in the last 168 hours. No results for input(s): AMMONIA in the last 168 hours. Coagulation Profile: Recent Labs  Lab 06/27/19 0430  INR 2.5*   Cardiac Enzymes: No results for input(s): CKTOTAL, CKMB, CKMBINDEX, TROPONINI in the last 168 hours. BNP (last 3 results) No results for input(s): PROBNP in the last 8760 hours. HbA1C: No results for input(s): HGBA1C in the last 72 hours. CBG: Recent Labs  Lab 06/27/19 0407  GLUCAP 85   Lipid Profile: No results for input(s): CHOL, HDL, LDLCALC, TRIG, CHOLHDL, LDLDIRECT  in the last 72 hours. Thyroid Function Tests: No results for input(s): TSH,  T4TOTAL, FREET4, T3FREE, THYROIDAB in the last 72 hours. Anemia Panel: No results for input(s): VITAMINB12, FOLATE, FERRITIN, TIBC, IRON, RETICCTPCT in the last 72 hours. Sepsis Labs: Recent Labs  Lab 06/26/19 2145  LATICACIDVEN 1.2    No results found for this or any previous visit (from the past 240 hour(s)).    Radiology Studies: No results found.   LOS: 14 days   Antonieta Pert, MD [facility]  06/29/2019, 9:49 AM

## 2019-06-29 NOTE — Progress Notes (Signed)
Pt will receive Rituxan (brand) today.  [facility] stocked Rituxan. Pt received Ruxience (biosimilar) w/ Cycle 1 at [facility]. Cline Crock, RN aware to infuse Rituxan over initial rate per protocol as 1st time Rituxan.  Kennith Center, Pharm.D., CPP 06/29/2019@9 :32 AM

## 2019-06-29 NOTE — Telephone Encounter (Signed)
Scheduled appt per 6/11 sch message - pt wife is aware of appt added.

## 2019-06-29 NOTE — Progress Notes (Signed)
Patient tolerated chemotherapy without incident.  Brisk blood return was noted from PICC line before, during, and after doxorubicin and vincristine administration.  Patient tolerated Rituxan without issue.  Patient was titrated up to 350 mg/hr.  Rituxan dose completed prior to titration up to 400 mg/hr.  Zandra Abts [facility]  06/29/2019  5:02 PM

## 2019-06-29 NOTE — Progress Notes (Signed)
Chemotherapy dosages, total VTBI, 2 pharmacy signatures, patient's BSA, and patient identifiers independently checked by me and by Aldean Baker, RN.  Tony Saunders [facility]  06/29/2019  11:32 AM

## 2019-06-29 NOTE — Progress Notes (Signed)
Left lower extremity arterial duplex completed. Refer to "CV Proc" under chart review to view preliminary results.  06/29/2019 4:19 PM Kelby Aline., MHA, RVT, RDCS, RDMS

## 2019-06-29 NOTE — Evaluation (Signed)
Occupational Therapy Evaluation Patient Details Name: Tony Saunders MRN: [id number] DOB: 20-Jul-1955 Today's Date: 06/29/2019    History of Present Illness Pt to ED 2* weakness and admitted with severe pancytopenia 2* chemo and increasing L LE edema 2* ongoing Lymphoma.  Pt with hx of MI, seizure disorder, CHF, PAF - pt underwent Cardioversion 06/22/19   Clinical Impression   Tony Saunders is a 64 year old man s/p STEMI in March 2021, cardioversion 06/22/2019 with large B cell lymphoma currently undergoing radiation and chemo. On evaluation patient presents with generalized weakness and poor activity tolerance. Evaluation limited by patient declining to sit up or get out of bed but demonstrates ability to move all extremities and roll in bed. Per patient he hasn't been able to do anything for himself since being in the hospital as he has predominantly been in bed. Patient reports he wants to go home soon and reports to therapist that he was able to function well before coming into the hospital and doesn't foresee difficulties at discharge. Patient lacking insight into current deficits. Patient will benefit from skilled OT services to improve deficits in order for patient to return home at discharge.    Follow Up Recommendations  Home health OT (Patient adamant about going home.)    Equipment Recommendations  3 in 1 bedside commode    Recommendations for Other Services       Precautions / Restrictions Precautions Precautions: Fall Precaution Comments: very frail, painful left foot Restrictions Weight Bearing Restrictions: No      Mobility Bed Mobility Overal bed mobility: Needs Assistance Bed Mobility: Rolling Rolling: Min assist         General bed mobility comments: Patient min assist to roll left and right.  Transfers                 General transfer comment: Patient not agreeable to get out of bed today.    Balance                                            ADL either performed or assessed with clinical judgement   ADL Overall ADL's : Needs assistance/impaired Eating/Feeding: Bed level;Set up;Sitting   Grooming: Set up;Bed level   Upper Body Bathing: Minimal assistance;Set up;Bed level   Lower Body Bathing: Maximal assistance;Set up;Bed level   Upper Body Dressing : Moderate assistance;Set up;Bed level   Lower Body Dressing: Set up;Bed level;Total assistance     Toilet Transfer Details (indicate cue type and reason): n/a Toileting- Clothing Manipulation and Hygiene: Total assistance;Bed level     Tub/Shower Transfer Details (indicate cue type and reason): n/a Functional mobility during ADLs: +2 for physical assistance;+2 for safety/equipment;Rolling walker       Vision   Vision Assessment?: No apparent visual deficits     Perception     Praxis      Pertinent Vitals/Pain Pain Assessment: Faces Faces Pain Scale: Hurts little more Pain Location: left foot Pain Descriptors / Indicators: Discomfort Pain Intervention(s): Monitored during session     Hand Dominance Right   Extremity/Trunk Assessment Upper Extremity Assessment Upper Extremity Assessment: Generalized weakness   Lower Extremity Assessment Lower Extremity Assessment: Defer to PT evaluation       Communication Communication Communication: HOH   Cognition Arousal/Alertness: Awake/alert Behavior During Therapy: WFL for tasks assessed/performed Overall Cognitive Status: Within Functional Limits for tasks  assessed                                 General Comments: Patient appears to have a lack of insight into deficits   General Comments       Exercises     Shoulder Instructions      Home Living Family/patient expects to be discharged to:: Private residence Living Arrangements: Spouse/significant other Available Help at Discharge: Family;Available 24 hours/day Type of Home: House Home Access: Stairs to  enter [employer] of Steps: 1   Home Layout: Two level;1/2 bath on main level Alternate Level Stairs-Number of Steps: flight Alternate Level Stairs-Rails: Right     Bathroom Toilet: Standard     Home Equipment: Walker - standard;Wheelchair - manual          Prior Functioning/Environment Level of Independence: Needs assistance  Gait / Transfers Assistance Needed: Pt used walker with 4 "skids" and assist of spouse, reports he could get up stairs with assistance of children. ADL's / Homemaking Assistance Needed: Sponge bathes on main level with assist of spouse   Comments: Pt uses walker in home and w/c for distance.  Single step into home with walker vs wc dependent on pt ability on given day        OT Problem List: Decreased strength;Decreased activity tolerance;Decreased cognition;Decreased knowledge of use of DME or AE;Pain;Increased edema      OT Treatment/Interventions: Self-care/ADL training;Therapeutic exercise;DME and/or AE instruction;Therapeutic activities;Patient/family education    OT Goals(Current goals can be found in the care plan section) Acute Rehab OT Goals Patient Stated Goal: to go home OT Goal Formulation: With patient Time For Goal Achievement: 07/13/19 Potential to Achieve Goals: Fair  OT Frequency: Min 2X/week   Barriers to D/C: Inaccessible home environment          Co-evaluation              AM-PAC OT "6 Clicks" Daily Activity     Outcome Measure Help from another person eating meals?: A Little Help from another person taking care of personal grooming?: A Little Help from another person toileting, which includes using toliet, bedpan, or urinal?: Total Help from another person bathing (including washing, rinsing, drying)?: A Lot Help from another person to put on and taking off regular upper body clothing?: A Lot Help from another person to put on and taking off regular lower body clothing?: Total 6 Click Score: 12   End of  Session Nurse Communication:  (okay to see per RN, discused patient did not want to get out of bed)  Activity Tolerance: Patient tolerated treatment well Patient left: in bed;with call bell/phone within reach  OT Visit Diagnosis: Muscle weakness (generalized) (M62.81);Pain Pain - Right/Left: Left Pain - part of body: Ankle and joints of foot                Time: 3825-0539 OT Time Calculation (min): 13 min Charges:  OT General Charges $OT Visit: 1 Visit OT Evaluation $OT Eval Low Complexity: 1 Low  Aayush Gelpi, OTR/L [place]  Office [phone] Pager: [place] 06/29/2019, 10:10 AM

## 2019-06-29 NOTE — Progress Notes (Addendum)
[facility] Therapy Treatment Record Phone [phone]   Radiation Therapy was administered to Jairo Ben on: 06/29/2019  2:57 PM and was treatment # 8 out of a planned course of 10 treatments.  Radiation Treatment  1). Beam photons with 6-10 energy and Photons 10-19 MeV  2). Brachytherapy None  3). Stereotactic Radiosurgery None  4). Other Radiation None     Tahra Hitzeman, Monserrate Blaschke, RT (T)

## 2019-06-30 ENCOUNTER — Other Ambulatory Visit: Payer: Self-pay

## 2019-06-30 LAB — CBC
HCT: 25.3 % — ABNORMAL LOW (ref 39.0–52.0)
Hemoglobin: 7.6 g/dL — ABNORMAL LOW (ref 13.0–17.0)
MCH: 26.6 pg (ref 26.0–34.0)
MCHC: 30 g/dL (ref 30.0–36.0)
MCV: 88.5 fL (ref 80.0–100.0)
Platelets: 266 10*3/uL (ref 150–400)
RBC: 2.86 MIL/uL — ABNORMAL LOW (ref 4.22–5.81)
RDW: 19.6 % — ABNORMAL HIGH (ref 11.5–15.5)
WBC: 7.2 10*3/uL (ref 4.0–10.5)
nRBC: 0 % (ref 0.0–0.2)

## 2019-06-30 LAB — BASIC METABOLIC PANEL
Anion gap: 7 (ref 5–15)
BUN: 14 mg/dL (ref 8–23)
CO2: 24 mmol/L (ref 22–32)
Calcium: 7.5 mg/dL — ABNORMAL LOW (ref 8.9–10.3)
Chloride: 105 mmol/L (ref 98–111)
Creatinine, Ser: 0.71 mg/dL (ref 0.61–1.24)
GFR calc Af Amer: >60 mL/min (ref >60)
GFR calc non Af Amer: >60 mL/min (ref >60)
Glucose, Bld: 108 mg/dL — ABNORMAL HIGH (ref 70–99)
Potassium: 4.5 mmol/L (ref 3.5–5.1)
Sodium: 136 mmol/L (ref 135–145)

## 2019-06-30 LAB — PREPARE RBC (CROSSMATCH)

## 2019-06-30 MED ORDER — ENSURE ENLIVE PO LIQD: 237.0000 mL | Freq: Two times a day (BID) | ORAL | 12 refills | Status: AC

## 2019-06-30 MED ORDER — CALCIUM CARBONATE ANTACID 500 MG PO CHEW: 1.0000 | CHEWABLE_TABLET | Freq: Three times a day (TID) | ORAL | 0 refills | Status: AC

## 2019-06-30 MED ORDER — PANTOPRAZOLE SODIUM 40 MG PO TBEC: 40.0000 mg | DELAYED_RELEASE_TABLET | Freq: Every day | ORAL | 3 refills | Status: AC

## 2019-06-30 MED ORDER — PREDNISONE 50 MG PO TABS: 100.0000 mg | ORAL_TABLET | Freq: Every day | ORAL | 0 refills | Status: AC

## 2019-06-30 MED ORDER — AMIODARONE HCL 200 MG PO TABS
ORAL_TABLET | ORAL | 0 refills | Status: DC
Start: 2019-06-30 — End: 2019-07-25

## 2019-06-30 MED ORDER — MIRTAZAPINE 15 MG PO TBDP: 15.0000 mg | ORAL_TABLET | Freq: Every day | ORAL | 0 refills | Status: DC

## 2019-06-30 MED ORDER — SODIUM CHLORIDE 0.9% IV SOLUTION: Freq: Once | INTRAVENOUS | Status: AC

## 2019-06-30 MED ORDER — MAGNESIUM OXIDE 400 (241.3 MG) MG PO TABS: 400.0000 mg | ORAL_TABLET | Freq: Two times a day (BID) | ORAL | 0 refills | Status: AC

## 2019-06-30 NOTE — Progress Notes (Signed)
Subjective: The patient is seen and examined today.  He is feeling fine today with no concerning complaints.  He denied having any events overnight.  He has no fever or chills.  He has no nausea, vomiting, diarrhea or constipation.  He denied having any headache or visual changes.  He tolerated his treatment with chemotherapy fairly well.  He is expected to be discharged home either today or tomorrow.  Objective: Vital signs in last 24 hours: Temp:  [97.4 F (36.3 C)-98 F (36.7 C)] 97.5 F (36.4 C) (06/12 0000) Pulse Rate:  [81-109] 93 (06/12 0400) Resp:  [20-37] 26 (06/12 0400) BP: (82-100)/(56-73) 92/73 (06/12 0400) SpO2:  [89 %-99 %] 91 % (06/12 0400) Weight:  [179 lb 14.3 oz (81.6 kg)] 179 lb 14.3 oz (81.6 kg) (06/12 0500)  Intake/Output from previous day: 06/11 0701 - 06/12 0700 In: 1387.5 [P.O.:320; I.V.:30.6; IV Piggyback:1036.9] Out: 600 [Urine:600] Intake/Output this shift: Total I/O In: -  Out: 200 [Urine:200]  General appearance: alert, cooperative and no distress Resp: clear to auscultation bilaterally Cardio: regular rate and rhythm, S1, S2 normal, no murmur, click, rub or gallop GI: soft, non-tender; bowel sounds normal; no masses,  no organomegaly Extremities: extremities normal, atraumatic, no cyanosis or edema  Lab Results:  Recent Labs    06/29/19 0500 06/30/19 0520  WBC 5.0 7.2  HGB 7.7* 7.6*  HCT 25.3* 25.3*  PLT 259 266   BMET Recent Labs    06/29/19 0500 06/30/19 0520  NA 137 136  K 3.9 4.5  CL 103 105  CO2 26 24  GLUCOSE 84 108*  BUN 11 14  CREATININE 0.78 0.71  CALCIUM 7.6* 7.5*    Studies/Results: VAS [place] LOWER EXTREMITY ARTERIAL DUPLEX  Result Date: 06/29/2019 LOWER EXTREMITY ARTERIAL DUPLEX STUDY Indications: Left leg cooler than right. 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  Current ABI: Not obtained secondary to significant swelling Comparison Study: No prior study Performing Technologist:  Maudry Mayhew MHA, RDMS, RVT, RDCS  Examination Guidelines: A complete evaluation includes B-mode imaging, spectral Doppler, color Doppler, and power Doppler as needed of all accessible portions of each vessel. Bilateral testing is considered an integral part of a complete examination. Limited examinations for reoccurring indications may be performed as noted.  +-----------+--------+-----+--------+---------+--------+ LEFT       PSV cm/sRatioStenosisWaveform Comments +-----------+--------+-----+--------+---------+--------+ CFA Distal 55                   triphasic         +-----------+--------+-----+--------+---------+--------+ DFA        36                   triphasic         +-----------+--------+-----+--------+---------+--------+ SFA Prox   58                   triphasic         +-----------+--------+-----+--------+---------+--------+ SFA Mid    72                   triphasic         +-----------+--------+-----+--------+---------+--------+ SFA Distal 49                   triphasic         +-----------+--------+-----+--------+---------+--------+ POP Distal 32                   triphasic         +-----------+--------+-----+--------+---------+--------+  ATA Distal 24                   triphasic         +-----------+--------+-----+--------+---------+--------+ PTA Distal 40                   triphasic         +-----------+--------+-----+--------+---------+--------+ PERO Distal19                   triphasic         +-----------+--------+-----+--------+---------+--------+ DP         18                   triphasic         +-----------+--------+-----+--------+---------+--------+  Summary: Left: Near normal examination of left lower extremityi arteries with minimal intimal thickening or plaque.  See table(s) above for measurements and observations. Electronically signed by Monica Martinez MD on 06/29/2019 at 8:39:51 PM.    Final      Medications: I have reviewed the patient's current medications.   Assessment/Plan: This is a very pleasant 64 years old white male with diffuse large B cell non-Hodgkin lymphoma diagnosed in May 2021.  The patient is currently undergoing systemic chemotherapy with R-CHOP status post 2 cycles.  The last cycle of his treatment was given yesterday.  The patient is tolerating his treatment well with no concerning adverse effects.  He was admitted for cardioversion.  The patient is feeling much better and he is currently on sinus rhythm. I recommended for the patient to come to the cancer center on Monday for his Neulasta injection. He was advised to call the office if he has any other concerning symptoms in the interval. Thank you for taking good care of Mr. Skibinski, we will continue to follow up the patient with you and assist in his management on as-needed basis.   LOS: 15 days    Eilleen Kempf 06/30/2019

## 2019-06-30 NOTE — TOC Progression Note (Signed)
Transition of Care [facility]) - Progression Note    Patient Details  Name: Tony Saunders MRN: [id number] Date of Birth: 1955/07/10  Transition of Care [facility]) CM/SW Contact  Joaquin Courts, RN Phone Number: 06/30/2019, 4:11 PM  Clinical Narrative:    CM spoke with patient and spouse regarding orders for [facility] services.  Spouse reports they have an appointment to see an outpatient physical therapist for an evaluation to determine if patient needs services.  Spouse reports that without this their health plan will not pay for anything and should the evaluation show that patient does not need PT services, their plan will not pay.  Spouse reports she cannot make any decisions about setting up any services until they have gone to this appointment.     Expected Discharge Plan: Home/Self Care Barriers to Discharge: No Barriers Identified  Expected Discharge Plan and Services Expected Discharge Plan: Home/Self Care   Discharge Planning Services: CM Consult   Living arrangements for the past 2 months: Single Family Home Expected Discharge Date: 06/30/19                                     Social Determinants of Health (SDOH) Interventions    Readmission Risk Interventions No flowsheet data found.

## 2019-06-30 NOTE — Discharge Instructions (Signed)
Abdominal Bloating When you have abdominal bloating, your abdomen may feel full, tight, or painful. It may also look bigger than normal or swollen (distended). Common causes of abdominal bloating include:  Swallowing air.  Constipation.  Problems digesting food.  Eating too much.  Irritable bowel syndrome. This is a condition that affects the large intestine.  Lactose intolerance. This is an inability to digest lactose, a natural sugar in dairy products.  Celiac disease. This is a condition that affects the ability to digest gluten, a protein found in some grains.  Gastroparesis. This is a condition that slows down the movement of food in the stomach and small intestine. It is more common in people with diabetes mellitus.  Gastroesophageal reflux disease (GERD). This is a digestive condition that makes stomach acid flow back into the esophagus.  Urinary retention. This means that the body is holding onto urine, and the bladder cannot be emptied all the way. Follow these instructions at home: Eating and drinking  Avoid eating too much.  Try not to swallow air while talking or eating.  Avoid eating while lying down.  Avoid these foods and drinks: ? Foods that cause gas, such as broccoli, cabbage, cauliflower, and baked beans. ? Carbonated drinks. ? Hard candy. ? Chewing gum. Medicines  Take over-the-counter and prescription medicines only as told by your health care provider.  Take probiotic medicines. These medicines contain live bacteria or yeasts that can help digestion.  Take coated peppermint oil capsules. Activity  Try to exercise regularly. Exercise may help to relieve bloating that is caused by gas and relieve constipation. General instructions  Keep all follow-up visits as told by your health care provider. This is important. Contact a health care provider if:  You have nausea and vomiting.  You have diarrhea.  You have abdominal pain.  You have unusual  weight loss or weight gain.  You have severe pain, and medicines do not help. Get help right away if:  You have severe chest pain.  You have trouble breathing.  You have shortness of breath.  You have trouble urinating.  You have darker urine than normal.  You have blood in your stools or have dark, tarry stools. Summary  Abdominal bloating means that the abdomen is swollen.  Common causes of abdominal bloating are swallowing air, constipation, and problems digesting food.  Avoid eating too much and avoid swallowing air.  Avoid foods that cause gas, carbonated drinks, hard candy, and chewing gum. This information is not intended to replace advice given to you by your health care provider. Make sure you discuss any questions you have with your health care provider. Document Revised: 04/24/2018 Document Reviewed: 02/06/2016 Elsevier Patient Education  [place].   Anemia  Anemia is a condition in which you do not have enough red blood cells or hemoglobin. Hemoglobin is a substance in red blood cells that carries oxygen. When you do not have enough red blood cells or hemoglobin (are anemic), your body cannot get enough oxygen and your organs may not work properly. As a result, you may feel very tired or have other problems. What are the causes? Common causes of anemia include:  Excessive bleeding. Anemia can be caused by excessive bleeding inside or outside the body, including bleeding from the intestine or from periods in women.  Poor nutrition.  Long-lasting (chronic) kidney, thyroid, and liver disease.  Bone marrow disorders.  Cancer and treatments for cancer.  HIV (human immunodeficiency virus) and AIDS (acquired immunodeficiency syndrome).  Treatments for HIV and AIDS.  Spleen problems.  Blood disorders.  Infections, medicines, and autoimmune disorders that destroy red blood cells. What are the signs or symptoms? Symptoms of this condition  include:  Minor weakness.  Dizziness.  Headache.  Feeling heartbeats that are irregular or faster than normal (palpitations).  Shortness of breath, especially with exercise.  Paleness.  Cold sensitivity.  Indigestion.  Nausea.  Difficulty sleeping.  Difficulty concentrating. Symptoms may occur suddenly or develop slowly. If your anemia is mild, you may not have symptoms. How is this diagnosed? This condition is diagnosed based on:  Blood tests.  Your medical history.  A physical exam.  Bone marrow biopsy. Your health care provider may also check your stool (feces) for blood and may do additional testing to look for the cause of your bleeding. You may also have other tests, including:  Imaging tests, such as a CT scan or MRI.  Endoscopy.  Colonoscopy. How is this treated? Treatment for this condition depends on the cause. If you continue to lose a lot of blood, you may need to be treated at a hospital. Treatment may include:  Taking supplements of iron, vitamin O96, or folic acid.  Taking a hormone medicine (erythropoietin) that can help to stimulate red blood cell growth.  Having a blood transfusion. This may be needed if you lose a lot of blood.  Making changes to your diet.  Having surgery to remove your spleen. Follow these instructions at home:  Take over-the-counter and prescription medicines only as told by your health care provider.  Take supplements only as told by your health care provider.  Follow any diet instructions that you were given.  Keep all follow-up visits as told by your health care provider. This is important. Contact a health care provider if:  You develop new bleeding anywhere in the body. Get help right away if:  You are very weak.  You are short of breath.  You have pain in your abdomen or chest.  You are dizzy or feel faint.  You have trouble concentrating.  You have bloody or black, tarry stools.  You vomit  repeatedly or you vomit up blood. Summary  Anemia is a condition in which you do not have enough red blood cells or enough of a substance in your red blood cells that carries oxygen (hemoglobin).  Symptoms may occur suddenly or develop slowly.  If your anemia is mild, you may not have symptoms.  This condition is diagnosed with blood tests as well as a medical history and physical exam. Other tests may be needed.  Treatment for this condition depends on the cause of the anemia. This information is not intended to replace advice given to you by your health care provider. Make sure you discuss any questions you have with your health care provider. Document Revised: 12/17/2016 Document Reviewed: 02/06/2016 Elsevier Patient Education  [place].   Hypotension As your heart beats, it forces blood through your body. Hypotension, commonly called low blood pressure, is when the force of blood pumping through your arteries is too weak. Arteries are blood vessels that carry blood from the heart throughout the body. Depending on the cause and severity, hypotension may be harmless (benign) or may cause serious problems (be critical). When blood pressure is too low, you may not get enough blood to your brain or to the rest of your organs. This can cause weakness, light-headedness, rapid heartbeat, and fainting. What are the causes? This condition may be  caused by:  Blood loss.  Loss of body fluids (dehydration).  Heart problems.  Hormone (endocrine) problems.  Pregnancy.  Severe infection.  Lack of certain nutrients.  Severe allergic reactions (anaphylaxis).  Certain medicines, such as blood pressure medicine or medicines that make the body lose excess fluids (diuretics). Sometimes, hypotension may be caused by not taking medicine as directed, such as taking too much of a certain medicine. What increases the risk? The following factors may make you more likely to develop this  condition:  Age. Risk increases as you get older.  Conditions that affect the heart or the central nervous system.  Taking certain medicines, such as blood pressure medicine or diuretics.  Being pregnant. What are the signs or symptoms? Common symptoms of this condition include:  Weakness.  Light-headedness.  Dizziness.  Blurred vision.  Fatigue.  Rapid heartbeat.  Fainting, in severe cases. How is this diagnosed? This condition is diagnosed based on:  Your medical history.  Your symptoms.  Your blood pressure measurement. Your health care provider will check your blood pressure when you are: ? Lying down. ? Sitting. ? Standing. A blood pressure reading is recorded as two numbers, such as "120 over 80" (or 120/80). The first ("top") number is called the systolic pressure. It is a measure of the pressure in your arteries as your heart beats. The second ("bottom") number is called the diastolic pressure. It is a measure of the pressure in your arteries when your heart relaxes between beats. Blood pressure is measured in a unit called mm Hg. Healthy blood pressure for most adults is 120/80. If your blood pressure is below 90/60, you may be diagnosed with hypotension. Other information or tests that may be used to diagnose hypotension include:  Your other vital signs, such as your heart rate and temperature.  Blood tests.  Tilt table test. For this test, you will be safely secured to a table that moves you from a lying position to an upright position. Your heart rhythm and blood pressure will be monitored during the test. How is this treated? Treatment for this condition may include:  Changing your diet. This may involve eating more salt (sodium) or drinking more water.  Taking medicines to raise your blood pressure.  Changing the dosage of certain medicines you are taking that might be lowering your blood pressure.  Wearing compression stockings. These stockings help  to prevent blood clots and reduce swelling in your legs. In some cases, you may need to go to the hospital for:  Fluid replacement. This means you will receive fluids through an IV.  Blood replacement. This means you will receive donated blood through an IV (transfusion).  Treating an infection or heart problems, if this applies.  Monitoring. You may need to be monitored while medicines that you are taking wear off. Follow these instructions at home: Eating and drinking   Drink enough fluid to keep your urine pale yellow.  Eat a healthy diet, and follow instructions from your health care provider about eating or drinking restrictions. A healthy diet includes: ? Fresh fruits and vegetables. ? Whole grains. ? Lean meats. ? Low-fat dairy products.  Eat extra salt only as directed. Do not add extra salt to your diet unless your health care provider told you to do that.  Eat frequent, small meals.  Avoid standing up suddenly after eating. Medicines  Take over-the-counter and prescription medicines only as told by your health care provider. ? Follow instructions from your health care  provider about changing the dosage of your current medicines, if this applies. ? Do not stop or adjust any of your medicines on your own. General instructions   Wear compression stockings as told by your health care provider.  Get up slowly from lying down or sitting positions. This gives your blood pressure a chance to adjust.  Avoid hot showers and excessive heat as directed by your health care provider.  Return to your normal activities as told by your health care provider. Ask your health care provider what activities are safe for you.  Do not use any products that contain nicotine or tobacco, such as cigarettes, e-cigarettes, and chewing tobacco. If you need help quitting, ask your health care provider.  Keep all follow-up visits as told by your health care provider. This is  important. Contact a health care provider if you:  Vomit.  Have diarrhea.  Have a fever for more than 2-3 days.  Feel more thirsty than usual.  Feel weak and tired. Get help right away if you:  Have chest pain.  Have a fast or irregular heartbeat.  Develop numbness in any part of your body.  Cannot move your arms or your legs.  Have trouble speaking.  Become sweaty or feel light-headed.  Faint.  Feel short of breath.  Have trouble staying awake.  Feel confused. Summary  Hypotension is when the force of blood pumping through your arteries is too weak.  Hypotension may be harmless (benign) or may cause serious problems (be critical).  Treatment for this condition may include changing your diet, changing your medicines, and wearing compression stockings.  In some cases, you may need to go to the hospital for fluid or blood replacement. This information is not intended to replace advice given to you by your health care provider. Make sure you discuss any questions you have with your health care provider. Document Revised: 06/30/2017 Document Reviewed: 06/30/2017 Elsevier Patient Education  [place].

## 2019-06-30 NOTE — Discharge Summary (Signed)
Physician Discharge Summary  Tony Saunders CXK:[id number] DOB: [birth year] DOA: 06/15/2019  PCP: [name], [name], MD  Admit date: 06/15/2019 Discharge date: 06/30/2019  Admitted From: home Disposition:  hhc  Recommendations for Outpatient Follow-up:  1. Follow up with PCP in 1-2 weeks 2. Please obtain BMP/CBC in one week 3. Please follow up on the following pending results:  Home Health:Yes  Equipment/Devices: nONE  Discharge Condition: Stable Code Status: full Diet recommendation:  Diet Order            Diet - low sodium heart healthy           Diet Heart Room service appropriate? Yes; Fluid consistency: Thin  Diet effective now                  Brief/Interim Summary: Brief Narrative: 64 year old male with diffuse B-cell lymphomaMay2021, started chemotherapy 5/19, presented for an outpatient port flushandradiation radiation simulation, found to be hypotensive and pancytopenic and was admitted to hospital for further evaluation and treatment. During hospitalization, patient developed atrial flutter with rapid ventricular response and cardiology was consulted patient was then transferred to the stepdown unit for closer monitoring and treatment. He was started on amiodarone drip and underwent cardioversion. He briefly converted again reverted in flutter. Patient underwent repeat cardioversion on 06/27/19.Patient was also started on milrinone drip during hospitalization - was subsequently discontinued. Continued to receive radiation treatment for B-cell lymphoma. 6/11-inpatient chemo rituximab infusion Seen by oncology tolerated chemo well with mild tachypnea tachycardia but otherwise hemodynamically stable.  Seen by oncology this morning recommended 1 unit PRBC transfusion prior to discharge.  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 fib/a flutter with RVR:s/p DCCV but went back to a flutter with variable block then again underwent DCCV on 6/9-in normal sinus rhythm since, on amiodarone to continue 200 mg twice daily x2 weeks then 20 mg daily and follow-up with Dr. Haroldine Laws in 2 weeks  Acute on chronic combined systolic/diastolic CHF: 9/70 was treated with IV Lasix, Coreg held due to soft blood pressure.  Has chronic left lower extremity edema more than the right secondary to lymphoma.  Cardiology signed off.  Of diuretics.   Colonic ileus :linically is improved  Oropharyngeal candidiasis due to chemo completed Diflucan, nystatin  Ischemic cardiomyopathy/CAD non-ST elevation MI in March, with DES : No chest pain continue home Brilinta and Xarelto no aspirin as he is on Xarelto.  Continue statin Coreg discontinued due to soft blood pressure.  Severe MR likely due totethering of post Leaflet per cardio-could consider MitraClip evaloncetreated for lymphoma  Hypotension: In the setting of LV dysfunction and severe MR.  Blood pressure overall is stable on home midodrine, initially on IV milrinone, weaned off.  Blood pressure stabilized after DCCV.  Recently diagnosed high-grade lymphoma:Follows with Dr. Julien Nordmann, s/p  second cycle of rituximab infusion and also radiation therapy /12.  Seen by oncology and okay to discharge on high-dose prednisone to complete total 5  days course and follow-up with hematology on Monday.  Pancytopenia/[place]  2/2chemotherapy/lymphoma-so far 3 units PRBC transfused. Hemoglobin at 7.6 g, hematology recommended 1 unit PRBC transfusion given that patient is getting chemo and will likely drop in next 1 to 2 weeks.  Patient was transfused 1 unit PRBC discussed with hematology and okay for discharge home after transfusion.  Follow-up CBC BMP next week. He will get neulasta on Monday as per his oncology  Encourage pt/ot IS has atelectasis on CXR In 06/24/19. But resp status stable saturating well on RA and no shortness of breath. He has been tachypneic in 20s, and with his soft BP his MEWS is in higher side  and not new.  Pt worked with PT after blood transfusion and did well. He is looking forward to going home today when I spoke to him after his PT session today.   Pressure Ulcer: Pressure Injury 06/15/19 Sacrum Stage 1 -  Intact skin with non-blanchable redness of a localized area usually over a bony prominence. (Active)  06/15/19 1848  Location: Sacrum  Location Orientation:   Staging: Stage 1 -  Intact skin with non-blanchable redness of a localized area usually over a bony prominence.  Wound Description (Comments):   Present on Admission: Yes    Consults:  Cardio,hem  Subjective: Doing well, no fever, chest pain no nausea or vomitting Received blood today  Discharge Exam: Vitals:   06/30/19 1328 06/30/19 1505  BP: 111/87 91/75  Pulse: (!) 107 (!) 102  Resp: (!) 27 (!) 24  Temp: (!) 96.4 F (35.8 C)   SpO2: 100% 98%   General: Pt is alert, awake, not in acute distress Cardiovascular: RRR, S1/S2 +, no rubs, no gallops Respiratory: CTA bilaterally, no wheezing, no rhonchi Abdominal: Soft, NT, ND, bowel sounds + Extremities: no edema, no cyanosis  Discharge Instructions  Discharge Instructions    (HEART FAILURE PATIENTS) Call MD:  Anytime you have any of the following symptoms: 1) 3 pound weight gain in 24 hours or 5 pounds in 1 week 2) shortness of breath, with or without a dry  hacking cough 3) swelling in the hands, feet or stomach 4) if you have to sleep on extra pillows at night in order to breathe.   Complete by: As directed    Diet - low sodium heart healthy   Complete by: As directed    Discharge instructions   Complete by: As directed    Please call call MD or return to ER for similar or worsening recurring problem that brought you to hospital or if any fever,nausea/vomiting,abdominal pain, uncontrolled pain, chest pain,  shortness of breath or any other alarming symptoms.  Please follow-up your doctor as instructed in a week time and call the office for appointment.  Please avoid alcohol, smoking, or any other illicit substance and maintain healthy habits including taking your regular medications as prescribed.  You were cared for by a hospitalist during your hospital stay. If you have any questions about your discharge medications or the care you received while you were in the hospital after you are discharged, you can call the unit and ask to speak with the hospitalist on call if the hospitalist that took care of you is not available.  Once you are discharged, your primary care physician will handle any further medical issues. Please note that NO REFILLS for any discharge medications will be authorized once you are discharged, as it is imperative that you return to your primary care physician (or  establish a relationship with a primary care physician if you do not have one) for your aftercare needs so that they can reassess your need for medications and monitor your lab values   Discharge wound care:   Complete by: As directed    Routine wound care   Face-to-face encounter (required for Medicare/Medicaid patients)   Complete by: As directed    I Antonieta Pert certify that this patient is under my care and that I, or a nurse practitioner or physician's assistant working with me, had a face-to-face encounter that meets the physician face-to-face encounter  requirements with this patient on 06/30/2019. The encounter with the patient was in whole, or in part for the following medical condition(s) which is the primary reason for home health care (List medical condition): Lymphoma, deconditioning, pancytopenia, status post chemo   The encounter with the patient was in whole, or in part, for the following medical condition, which is the primary reason for home health care: Deconditioning, lymphoma, pancytopenia   I certify that, based on my findings, the following services are medically necessary home health services:  Nursing Physical therapy     Reason for Medically Necessary Home Health Services: Skilled Nursing- Change/Decline in Patient Status   My clinical findings support the need for the above services: Unable to leave home safely without assistance and/or assistive device   Further, I certify that my clinical findings support that this patient is homebound due to: Unsafe ambulation due to balance issues   Home Health   Complete by: As directed    To provide the following care/treatments:  PT RN     Increase activity slowly   Complete by: As directed      Allergies as of 06/30/2019   No Known Allergies     Medication List    STOP taking these medications   carvedilol 6.25 MG tablet Commonly known as: COREG   furosemide 40 MG tablet Commonly known as: LASIX     TAKE these medications   allopurinol 300 MG tablet Commonly known as: ZYLOPRIM Take 1 tablet (300 mg total) by mouth daily.   amiodarone 200 MG tablet Commonly known as: PACERONE Take 200 mg PO BID x 2 wk then 200 mg ONCE daily. What changed:   how much to take  how to take this  when to take this  additional instructions   calcium carbonate 500 MG chewable tablet Commonly known as: TUMS - dosed in mg elemental calcium Chew 1 tablet (200 mg of elemental calcium total) by mouth 3 (three) times daily.   clopidogrel 75 MG tablet Commonly known as: PLAVIX Take  1 tablet (75 mg total) by mouth daily.   feeding supplement (ENSURE ENLIVE) Liqd Take 237 mLs by mouth 2 (two) times daily between meals.   lamoTRIgine 100 MG tablet Commonly known as: LAMICTAL Take 1 tablet (100 mg total) by mouth 2 (two) times daily.   levETIRAcetam 750 MG tablet Commonly known as: KEPPRA Take 1 tablet (750 mg total) by mouth 2 (two) times daily.   lidocaine-prilocaine cream Commonly known as: EMLA Apply 1 application topically as needed.   magnesium oxide 400 (241.3 Mg) MG tablet Commonly known as: MAG-OX Take 1 tablet (400 mg total) by mouth 2 (two) times daily for 7 days.   midodrine 2.5 MG tablet Commonly known as: PROAMATINE Take 1 tablet (2.5 mg total) by mouth 3 (three) times daily with meals.   mirtazapine 15 MG disintegrating tablet Commonly known as: REMERON SOL-TAB Take  1 tablet (15 mg total) by mouth at bedtime.   nitroGLYCERIN 0.4 MG SL tablet Commonly known as: NITROSTAT Place 1 tablet (0.4 mg total) under the tongue every 5 (five) minutes as needed for chest pain.   ondansetron 4 MG tablet Commonly known as: ZOFRAN Take 1 tablet (4 mg total) by mouth every 6 (six) hours as needed for nausea.   pantoprazole 40 MG tablet Commonly known as: PROTONIX Take 1 tablet (40 mg total) by mouth daily. What changed:   when to take this  reasons to take this   potassium chloride SA 20 MEQ tablet Commonly known as: KLOR-CON Take 1 tablet (20 mEq total) by mouth daily as needed (when you take Furosemide).   predniSONE 50 MG tablet Commonly known as: DELTASONE Take 2 tablets for 5 days every 21 days with chemotherapy. What changed: Another medication with the same name was added. Make sure you understand how and when to take each.   predniSONE 50 MG tablet Commonly known as: DELTASONE Take 2 tablets (100 mg total) by mouth daily with breakfast for 3 days. Start taking on: July 01, 2019 What changed: You were already taking a medication with  the same name, and this prescription was added. Make sure you understand how and when to take each.   prochlorperazine 10 MG tablet Commonly known as: COMPAZINE Take 1 tablet (10 mg total) by mouth every 6 (six) hours as needed for nausea or vomiting.   rivaroxaban 20 MG Tabs tablet Commonly known as: Xarelto Take 1 tablet (20 mg total) by mouth daily with supper. What changed: when to take this   rosuvastatin 5 MG tablet Commonly known as: CRESTOR Take 1 tablet (5 mg total) by mouth daily. What changed: when to take this   sertraline 25 MG tablet Commonly known as: ZOLOFT Take 1 tablet (25 mg total) by mouth daily.   ticagrelor 90 MG Tabs tablet Commonly known as: BRILINTA Take 90 mg by mouth 2 (two) times daily. Finish prescription and then switch to Plavix            Discharge Care Instructions  (From admission, onward)         Start     Ordered   06/30/19 0000  Discharge wound care:       Comments: Routine wound care   06/30/19 1532          Follow-up Information    Bensimhon, Shaune Pascal, MD Follow up on 07/05/2019.   Specialty: Cardiology Why: Please arrive 15 minutes early for your 10:40am post-hospital cardiology follow-up appointment.  Contact information: [place] [address] [phone]        [name], [name], MD Follow up in 1 week(s).   Specialty: Internal Medicine Contact information: [place] [address] [phone]        Leonie Man, MD .   Specialty: Cardiology Contact information: [address] [place] [address] [phone]        Wyatt Portela, MD Follow up on 07/02/2019.   Specialty: Oncology Why: on monday as scheduled. call for time Contact information: [address] [place] [address] [phone]              No Known Allergies  The results of significant diagnostics from this hospitalization  (including imaging, microbiology, ancillary and laboratory) are listed below for reference.    Microbiology: No results found for this or any previous  visit (from the past 240 hour(s)).  Procedures/Studies: IR Fluoro Guide CV Line Right  Result Date: 06/05/2019 INDICATION: Poor venous access. In need of durable intravenous access for chemotherapy administration. EXAM: ULTRASOUND AND FLUOROSCOPIC GUIDED PICC LINE INSERTION MEDICATIONS: None. CONTRAST:  None FLUOROSCOPY TIME:  12 seconds (1.4 mGy) COMPLICATIONS: None immediate. TECHNIQUE: The procedure, risks, benefits, and alternatives were explained to the patient and informed written consent was obtained. A timeout was performed prior to the initiation of the procedure. The right upper extremity was prepped with chlorhexidine in a sterile fashion, and a sterile drape was applied covering the operative field. Maximum barrier sterile technique with sterile gowns and gloves were used for the procedure. A timeout was performed prior to the initiation of the procedure. Local anesthesia was provided with 1% lidocaine. Under direct ultrasound guidance, the brachial vein was accessed with a micropuncture kit after the overlying soft tissues were anesthetized with 1% lidocaine. After the overlying soft tissues were anesthetized, a small venotomy incision was created and a micropuncture kit was utilized to access the right brachial vein. Real-time ultrasound guidance was utilized for vascular access including the acquisition of a permanent ultrasound image documenting patency of the accessed vessel. A guidewire was advanced to the level of the superior caval-atrial junction for measurement purposes and the PICC line was cut to length. A peel-away sheath was placed and a 40 cm, 5 [place], dual lumen was inserted to level of the superior caval-atrial junction. A post procedure spot fluoroscopic was obtained. The catheter easily aspirated and flushed and was secured in  place with stat lock device. A dressing was applied. The patient tolerated the procedure well without immediate post procedural complication. FINDINGS: After catheter placement, the tip lies within the superior cavoatrial junction. The catheter aspirates and flushes normally and is ready for immediate use. IMPRESSION: Successful ultrasound and fluoroscopic guided placement of a right brachial vein approach, 40 cm, 5 French, dual lumen PICC with tip at the superior caval-atrial junction. The PICC line is ready for immediate use. Electronically Signed   By: Sandi Mariscal M.D.   On: 06/05/2019 09:18   DG CHEST PORT 1 VIEW  Result Date: 06/24/2019 CLINICAL DATA:  Hypotension. EXAM: PORTABLE CHEST 1 VIEW COMPARISON:  Jun 18, 2019 FINDINGS: A right-sided PICC line is seen with its distal tip noted at the junction of the superior vena cava and right atrium. Mild diffusely increased lung markings are seen bilaterally. Moderate severity atelectasis and/or infiltrate is seen within the retrocardiac region of the left lung base. Small bilateral pleural effusions are seen, right greater than left. No pneumothorax is identified. The cardiac silhouette is mildly enlarged. The visualized skeletal structures are unremarkable. IMPRESSION: 1. Right-sided PICC line in satisfactory position. 2. Moderate severity atelectasis and/or infiltrate within the retrocardiac region of the left lung base. 3. Small bilateral pleural effusions, right greater than left. Electronically Signed   By: Virgina Norfolk M.D.   On: 06/24/2019 15:50   DG CHEST PORT 1 VIEW  Result Date: 06/18/2019 CLINICAL DATA:  Dyspnea.  Chest congestion. EXAM: PORTABLE CHEST 1 VIEW COMPARISON:  06/15/2019 05/22/2019 FINDINGS: The patient has now developed bilateral perihilar interstitial pulmonary edema. Persistent large right pleural effusion. Increased left pleural effusion with atelectasis at the left lung base. Heart size and pulmonary vascularity are normal.  PICC line in good position, unchanged just above the cavoatrial junction. IMPRESSION: 1. New bilateral perihilar interstitial pulmonary edema. 2. Persistent large right pleural effusion. 3. Increased left effusion and atelectasis.  Electronically Signed   By: Lorriane Shire M.D.   On: 06/18/2019 16:14   DG Chest Port 1 View  Result Date: 06/15/2019 CLINICAL DATA:  Shortness of breath. Weakness. Status post chemotherapy and radiation therapy for diffuse B cell lymphoma. History of CHF. EXAM: PORTABLE CHEST 1 VIEW COMPARISON:  05/22/2019 FINDINGS: Cardiac silhouette near the upper limit of normal in size with an interval mild decrease in size. Small to moderate-sized right pleural effusion, mildly increased. Clear left lung. No left pleural fluid. Unremarkable bones. IMPRESSION: Small to moderate-sized right pleural effusion, mildly increased. Electronically Signed   By: Claudie Revering M.D.   On: 06/15/2019 17:44   DG Abd Portable 1V  Result Date: 06/15/2019 CLINICAL DATA:  Abdomen distension EXAM: PORTABLE ABDOMEN - 1 VIEW COMPARISON:  CT 05/22/2019 FINDINGS: Interval moderate gaseous dilatation of primarily colon with gas in the pelvis. No radiopaque calculi. Rim calcified stones in the gallbladder/right upper quadrant. IMPRESSION: Interval moderate gaseous dilatation of primarily colon with some gas-filled small bowel suggesting an ileus. Gallstones. Electronically Signed   By: Donavan Foil M.D.   On: 06/15/2019 20:52   ECHOCARDIOGRAM COMPLETE  Result Date: 06/24/2019    ECHOCARDIOGRAM REPORT   Patient Name:   Tony Saunders Date of Exam: 06/24/2019 Medical Rec #:  [id number]            Height:       72.0 in Accession #:    [id number]           Weight:       175.0 lb Date of Birth:  Aug 25, 1955            BSA:          2.013 m Patient Age:    16 years             BP:           90/64 mmHg Patient Gender: M                    HR:           98 bpm. Exam Location:  Inpatient Procedure: 2D Echo, Cardiac  Doppler and Color Doppler Indications:    Pericardial Effusion  History:        Patient has prior history of Echocardiogram examinations, most                 recent 05/14/2019. Signs/Symptoms:Hypotension.  Sonographer:    Merrie Roof RDCS Referring Phys: [place]  1. Left ventricular ejection fraction, by estimation, is 35 to 40%. The left ventricle has moderately decreased function. The left ventricle demonstrates global hypokinesis. The left ventricular internal cavity size was mildly dilated. Left ventricular diastolic parameters are indeterminate.  2. Right ventricular systolic function is normal. The right ventricular size is normal. Tricuspid regurgitation signal is inadequate for assessing PA pressure.  3. Left atrial size was mildly dilated.  4. Right atrial size was mildly dilated.  5. The mitral valve is normal in structure. Severe mitral valve regurgitation from teathering of posterior MV leaflet. No evidence of mitral stenosis.  6. The aortic valve is tricuspid. Aortic valve regurgitation is not visualized. Mild to moderate aortic valve sclerosis/calcification is present, without any evidence of aortic stenosis.  7. The inferior vena cava is normal in size with greater than 50% respiratory variability, suggesting right atrial pressure of 3 mmHg.  8. Large pleural effusion in the left lateral region. No pericardial effusion. FINDINGS  Left Ventricle: Left ventricular ejection fraction, by estimation, is 35 to 40%. The left ventricle has moderately decreased function. The left ventricle demonstrates global hypokinesis. The left ventricular internal cavity size was mildly dilated. There is no left ventricular hypertrophy. Left ventricular diastolic parameters are indeterminate. Right Ventricle: The right ventricular size is normal. No increase in right ventricular wall thickness. Right ventricular systolic function is normal. Tricuspid regurgitation signal is inadequate for assessing  PA pressure. Left Atrium: Left atrial size was mildly dilated. Right Atrium: Right atrial size was mildly dilated. Pericardium: There is no evidence of pericardial effusion. Mitral Valve: The mitral valve is normal in structure. There is mild thickening of the mitral valve leaflet(s). Normal mobility of the mitral valve leaflets. Severe mitral valve regurgitation, with eccentric posteriorly directed jet. No evidence of mitral valve stenosis. Tricuspid Valve: The tricuspid valve is normal in structure. Tricuspid valve regurgitation is mild . No evidence of tricuspid stenosis. Aortic Valve: The aortic valve is tricuspid. Aortic valve regurgitation is not visualized. Mild to moderate aortic valve sclerosis/calcification is present, without any evidence of aortic stenosis. Pulmonic Valve: The pulmonic valve was normal in structure. Pulmonic valve regurgitation is trivial. No evidence of pulmonic stenosis. Aorta: The aortic root is normal in size and structure. Venous: The inferior vena cava is normal in size with greater than 50% respiratory variability, suggesting right atrial pressure of 3 mmHg. IAS/Shunts: No atrial level shunt detected by color flow Doppler. Additional Comments: There is a large pleural effusion in the left lateral region.  LEFT VENTRICLE PLAX 2D LVIDd:         5.60 cm      Diastology LVIDs:         4.20 cm      LV e' lateral: 11.10 cm/s LV PW:         0.80 cm      LV e' medial:  9.57 cm/s LV IVS:        1.90 cm LVOT diam:     2.00 cm LV SV:         46 LV SV Index:   23 LVOT Area:     3.14 cm  LV Volumes (MOD) LV vol d, MOD A2C: 199.0 ml LV vol d, MOD A4C: 179.0 ml LV vol s, MOD A2C: 126.0 ml LV vol s, MOD A4C: 78.5 ml LV SV MOD A2C:     73.0 ml LV SV MOD A4C:     179.0 ml LV SV MOD BP:      86.9 ml RIGHT VENTRICLE RV Basal diam:  3.40 cm RV S prime:     9.46 cm/s TAPSE (M-mode): 0.9 cm LEFT ATRIUM             Index       RIGHT ATRIUM           Index LA diam:        4.90 cm 2.43 cm/m  RA Area:      23.50 cm LA Vol (A2C):   70.0 ml 34.77 ml/m RA Volume:   79.10 ml  39.29 ml/m LA Vol (A4C):   78.3 ml 38.89 ml/m LA Biplane Vol: 82.1 ml 40.78 ml/m  AORTIC VALVE LVOT Vmax:   97.50 cm/s LVOT Vmean:  58.300 cm/s LVOT VTI:    0.147 m  AORTA Ao Root diam: 3.10 cm MR Peak grad:    94.7 mmHg MR Mean grad:    60.0 mmHg   SHUNTS MR Vmax:  486.50 cm/s Systemic VTI:  0.15 m MR Vmean:        362.0 cm/s  Systemic Diam: 2.00 cm MR PISA:         5.09 cm MR PISA Eff ROA: 32 mm MR PISA Radius:  0.90 cm [name] MD Electronically signed by [name] MD Signature Date/Time: 06/24/2019/1:10:34 PM    Final    VAS [place] LOWER EXTREMITY ARTERIAL DUPLEX  Result Date: 06/29/2019 LOWER EXTREMITY ARTERIAL DUPLEX STUDY Indications: Left leg cooler than right. 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  Current ABI: Not obtained secondary to significant swelling Comparison Study: No prior study Performing Technologist: Maudry Mayhew MHA, RDMS, RVT, RDCS  Examination Guidelines: A complete evaluation includes B-mode imaging, spectral Doppler, color Doppler, and power Doppler as needed of all accessible portions of each vessel. Bilateral testing is considered an integral part of a complete examination. Limited examinations for reoccurring indications may be performed as noted.  +-----------+--------+-----+--------+---------+--------+ LEFT       PSV cm/sRatioStenosisWaveform Comments +-----------+--------+-----+--------+---------+--------+ CFA Distal 55                   triphasic         +-----------+--------+-----+--------+---------+--------+ DFA        36                   triphasic         +-----------+--------+-----+--------+---------+--------+ SFA Prox   58                   triphasic         +-----------+--------+-----+--------+---------+--------+ SFA Mid    72                   triphasic          +-----------+--------+-----+--------+---------+--------+ SFA Distal 49                   triphasic         +-----------+--------+-----+--------+---------+--------+ POP Distal 32                   triphasic         +-----------+--------+-----+--------+---------+--------+ ATA Distal 24                   triphasic         +-----------+--------+-----+--------+---------+--------+ PTA Distal 40                   triphasic         +-----------+--------+-----+--------+---------+--------+ PERO Distal19                   triphasic         +-----------+--------+-----+--------+---------+--------+ DP         18                   triphasic         +-----------+--------+-----+--------+---------+--------+  Summary: Left: Near normal examination of left lower extremityi arteries with minimal intimal thickening or plaque.  See table(s) above for measurements and observations. Electronically signed by Monica Martinez MD on 06/29/2019 at 8:39:51 PM.    Final    [place] EKG SITE RITE  Result Date: 06/19/2019 If Site Rite image not attached, placement could not be confirmed due to current cardiac rhythm.   Labs: BNP (last 3 results) No results for input(s): BNP in the last 8760 hours. Basic Metabolic Panel: Recent Labs  Lab  06/25/19 0500 06/25/19 0500 06/26/19 0613 06/27/19 0430 06/28/19 0909 06/29/19 0500 06/30/19 0520  NA 139   < > 137 135 138 137 136  K 4.2   < > 4.9 4.1 3.5 3.9 4.5  CL 104   < > 105 103 110 103 105  CO2 28   < > 25 27 23 26 24   GLUCOSE 86   < > 86 78 75 84 108*  BUN 15   < > 13 13 10 11 14   CREATININE 0.84   < > 0.86 0.85 0.66 0.78 0.71  CALCIUM 7.6*   < > 7.5* 7.5* 6.7* 7.6* 7.5*  MG 1.9  --  2.0 2.3 2.1 2.3  --   PHOS  --   --   --  2.1* 2.9 3.5  --    < > = values in this interval not displayed.   Liver Function Tests: Recent Labs  Lab 06/29/19 0500  AST 25  ALT 25  ALKPHOS 74  BILITOT 0.5  PROT 4.4*  ALBUMIN 2.1*   No results for  input(s): LIPASE, AMYLASE in the last 168 hours. No results for input(s): AMMONIA in the last 168 hours. CBC: Recent Labs  Lab 06/26/19 0613 06/27/19 0430 06/28/19 0909 06/29/19 0500 06/30/19 0520  WBC 6.9 5.2 5.0 5.0 7.2  HGB 8.4* 7.6* 7.9* 7.7* 7.6*  HCT 27.9* 25.3* 26.3* 25.3* 25.3*  MCV 88.3 86.9 87.4 86.3 88.5  PLT 277 261 257 259 266   Cardiac Enzymes: No results for input(s): CKTOTAL, CKMB, CKMBINDEX, TROPONINI in the last 168 hours. BNP: Invalid input(s): POCBNP CBG: Recent Labs  Lab 06/27/19 0407  GLUCAP 85   D-Dimer No results for input(s): DDIMER in the last 72 hours. Hgb A1c No results for input(s): HGBA1C in the last 72 hours. Lipid Profile No results for input(s): CHOL, HDL, LDLCALC, TRIG, CHOLHDL, LDLDIRECT in the last 72 hours. Thyroid function studies No results for input(s): TSH, T4TOTAL, T3FREE, THYROIDAB in the last 72 hours.  Invalid input(s): FREET3 Anemia work up No results for input(s): VITAMINB12, FOLATE, FERRITIN, TIBC, IRON, RETICCTPCT in the last 72 hours. Urinalysis    Component Value Date/Time   COLORURINE YELLOW 06/24/2019 1346   APPEARANCEUR HAZY (A) 06/24/2019 1346   LABSPEC 1.021 06/24/2019 1346   PHURINE 5.0 06/24/2019 1346   GLUCOSEU NEGATIVE 06/24/2019 1346   HGBUR SMALL (A) 06/24/2019 1346   BILIRUBINUR NEGATIVE 06/24/2019 1346   KETONESUR NEGATIVE 06/24/2019 1346   PROTEINUR NEGATIVE 06/24/2019 1346   NITRITE NEGATIVE 06/24/2019 1346   LEUKOCYTESUR NEGATIVE 06/24/2019 1346   Sepsis Labs Invalid input(s): PROCALCITONIN,  WBC,  LACTICIDVEN Microbiology No results found for this or any previous visit (from the past 240 hour(s)).   Time coordinating discharge: 25  minutes  SIGNED: Antonieta Pert, MD  [facility] 06/30/2019, 3:32 PM  If 7PM-7AM, please contact night-coverage [email or web address]

## 2019-06-30 NOTE — Progress Notes (Signed)
Physical Therapy Treatment Patient Details Name: Tony Saunders MRN: [id number] DOB: 09/28/1955 Today's Date: 06/30/2019    History of Present Illness Pt to ED 2* weakness and admitted with severe pancytopenia 2* chemo and increasing L LE edema 2* ongoing Lymphoma.  Pt with hx of MI, seizure disorder, CHF, PAF - pt underwent Cardioversion 06/22/19    PT Comments    Pt with progressed ambulation distance this session, ~30 ft with standing rest break x1 to recover LE and central fatigue. Pt tachycardic to 118 bpm during activity, and requires min assist to steady at times during gait. PT administered gait belt for home use by family, pt states his family will give him 24/7 assist and can physically assist him with transfers and gait as needed. PT educated pt on HEP until HHPT starts for LE strengthening and circulation, pt agreeable. Pt eager to d/c home, has w/c at home if needed initially for getting into and out of house. PT to continue to follow acutely.    Follow Up Recommendations  Home health PT;Supervision/Assistance - 24 hour     Equipment Recommendations  3in1 (PT)    Recommendations for Other Services       Precautions / Restrictions Precautions Precautions: Fall Precaution Comments: very frail, painful left foot    Mobility  Bed Mobility Overal bed mobility: Needs Assistance Bed Mobility: Supine to Sit;Sit to Supine     Supine to sit: Min assist Sit to supine: Min assist   General bed mobility comments: Min assist for supine<>sit for LLE management, increased time and effort to perform especially scooting to and from EOB.  Transfers Overall transfer level: Needs assistance Equipment used: Rolling walker (2 wheeled) Transfers: Sit to/from Stand Sit to Stand: Min assist;From elevated surface         General transfer comment: Min assist for initial power up and steadying upon standing, with verbal cuing for hand placement when rising. First stand attempt  from bed's lowest height setting, not successful and pt states he does not stand from that low of a surface.  Ambulation/Gait Ambulation/Gait assistance: Min assist;Min guard Gait Distance (Feet): 30 Feet Assistive device: Rolling walker (2 wheeled) Gait Pattern/deviations: Shuffle;Trunk flexed;Step-through pattern;Decreased stride length Gait velocity: decr   General Gait Details: Min assist to steady especially in first 5 ft ambulation, transitioning to close min guard for safety. Verbal cuing for proximity of RW to self especially during transitional movements i.e. walking to stand>sit.   Stairs             Wheelchair Mobility    Modified Rankin (Stroke Patients Only)       Balance Overall balance assessment: Needs assistance;History of Falls Sitting-balance support: Feet supported;Bilateral upper extremity supported Sitting balance-Leahy Scale: Fair     Standing balance support: Bilateral upper extremity supported;During functional activity Standing balance-Leahy Scale: Poor                              Cognition Arousal/Alertness: Awake/alert Behavior During Therapy: WFL for tasks assessed/performed Overall Cognitive Status: Within Functional Limits for tasks assessed                                        Exercises General Exercises - Lower Extremity Ankle Circles/Pumps: AROM;Both;5 reps;Supine Long Arc Quad: AROM;Both;5 reps;Seated Hip Flexion/Marching: AROM;Both;5 reps;Seated    General Comments  Pertinent Vitals/Pain Pain Assessment: 0-10 Pain Score: 4  Pain Location: left foot Pain Descriptors / Indicators: Discomfort;Grimacing;Other (Comment) (weakness) Pain Intervention(s): Monitored during session;Repositioned;Limited activity within patient's tolerance    Home Living                      Prior Function            PT Goals (current goals can now be found in the care plan section) Acute Rehab PT  Goals Patient Stated Goal: to go home PT Goal Formulation: With patient Time For Goal Achievement: 07/07/19 Potential to Achieve Goals: Fair Progress towards PT goals: Progressing toward goals    Frequency    Min 3X/week      PT Plan Current plan remains appropriate    Co-evaluation              AM-PAC PT "6 Clicks" Mobility   Outcome Measure  Help needed turning from your back to your side while in a flat bed without using bedrails?: A Little Help needed moving from lying on your back to sitting on the side of a flat bed without using bedrails?: A Little Help needed moving to and from a bed to a chair (including a wheelchair)?: A Lot Help needed standing up from a chair using your arms (e.g., wheelchair or bedside chair)?: A Little Help needed to walk in hospital room?: A Little Help needed climbing 3-5 steps with a railing? : A Lot 6 Click Score: 16    End of Session Equipment Utilized During Treatment: Gait belt Activity Tolerance: Patient limited by fatigue Patient left: with call bell/phone within reach;in bed;with bed alarm set Nurse Communication: Mobility status PT Visit Diagnosis: Difficulty in walking, not elsewhere classified (R26.2);Muscle weakness (generalized) (M62.81)     Time: 2952-8413 PT Time Calculation (min) (ACUTE ONLY): 20 min  Charges:  $Gait Training: 8-22 mins                     Genesee Nase E, PT [place] Pager [phone]  Office [phone]    Monnie Gudgel D Elonda Husky 06/30/2019, 3:19 PM

## 2019-07-01 LAB — TYPE AND SCREEN
ABO/RH(D): O POS
Antibody Screen: NEGATIVE
Unit division: 0

## 2019-07-01 LAB — BPAM RBC
Blood Product Expiration Date: 202107092359
ISSUE DATE / TIME: 202106121040
Unit Type and Rh: 5100

## 2019-07-02 ENCOUNTER — Ambulatory Visit
Admission: RE | Admit: 2019-07-02 | Discharge: 2019-07-02 | Disposition: A | Discharge status: Home / Self Care | Payer: No Typology Code available for payment source | Source: Ambulatory Visit | Attending: Radiation Oncology | Admitting: Radiation Oncology

## 2019-07-02 ENCOUNTER — Ambulatory Visit: Payer: No Typology Code available for payment source

## 2019-07-02 ENCOUNTER — Inpatient Hospital Stay: Payer: No Typology Code available for payment source | Attending: Oncology

## 2019-07-02 ENCOUNTER — Other Ambulatory Visit: Payer: Self-pay

## 2019-07-02 VITALS — BP 98/75 | HR 113 | Resp 20

## 2019-07-02 DIAGNOSIS — Z79899 Other long term (current) drug therapy: Secondary | ICD-10-CM | POA: Insufficient documentation

## 2019-07-02 DIAGNOSIS — R590 Localized enlarged lymph nodes: Secondary | ICD-10-CM | POA: Insufficient documentation

## 2019-07-02 DIAGNOSIS — J90 Pleural effusion, not elsewhere classified: Secondary | ICD-10-CM | POA: Insufficient documentation

## 2019-07-02 DIAGNOSIS — K8020 Calculus of gallbladder without cholecystitis without obstruction: Secondary | ICD-10-CM | POA: Insufficient documentation

## 2019-07-02 DIAGNOSIS — C8299 Follicular lymphoma, unspecified, extranodal and solid organ sites: Secondary | ICD-10-CM

## 2019-07-02 DIAGNOSIS — C8330 Diffuse large B-cell lymphoma, unspecified site: Secondary | ICD-10-CM | POA: Insufficient documentation

## 2019-07-02 DIAGNOSIS — R5381 Other malaise: Secondary | ICD-10-CM | POA: Insufficient documentation

## 2019-07-02 DIAGNOSIS — Z7901 Long term (current) use of anticoagulants: Secondary | ICD-10-CM | POA: Insufficient documentation

## 2019-07-02 DIAGNOSIS — I4891 Unspecified atrial fibrillation: Secondary | ICD-10-CM | POA: Insufficient documentation

## 2019-07-02 DIAGNOSIS — Z5189 Encounter for other specified aftercare: Secondary | ICD-10-CM | POA: Insufficient documentation

## 2019-07-02 DIAGNOSIS — Z5111 Encounter for antineoplastic chemotherapy: Secondary | ICD-10-CM | POA: Diagnosis not present

## 2019-07-02 DIAGNOSIS — E883 Tumor lysis syndrome: Secondary | ICD-10-CM | POA: Insufficient documentation

## 2019-07-02 DIAGNOSIS — I509 Heart failure, unspecified: Secondary | ICD-10-CM | POA: Insufficient documentation

## 2019-07-02 MED ORDER — PEGFILGRASTIM-JMDB 6 MG/0.6ML ~~LOC~~ SOSY
6.0000 mg | PREFILLED_SYRINGE | Freq: Once | SUBCUTANEOUS | Status: AC
  Administered 2019-07-02: 6 mg via SUBCUTANEOUS

## 2019-07-02 MED ORDER — PEGFILGRASTIM-JMDB 6 MG/0.6ML ~~LOC~~ SOSY
PREFILLED_SYRINGE | SUBCUTANEOUS | Status: AC
  Filled 2019-07-02: qty 0.6

## 2019-07-02 NOTE — Patient Instructions (Signed)

## 2019-07-03 ENCOUNTER — Ambulatory Visit
Admission: RE | Admit: 2019-07-03 | Discharge: 2019-07-03 | Disposition: A | Discharge status: Home / Self Care | Payer: No Typology Code available for payment source | Source: Ambulatory Visit | Attending: Radiation Oncology | Admitting: Radiation Oncology

## 2019-07-03 ENCOUNTER — Other Ambulatory Visit: Payer: Self-pay

## 2019-07-03 ENCOUNTER — Encounter: Payer: Self-pay | Admitting: Radiation Oncology

## 2019-07-03 DIAGNOSIS — Z5111 Encounter for antineoplastic chemotherapy: Secondary | ICD-10-CM | POA: Diagnosis not present

## 2019-07-04 ENCOUNTER — Ambulatory Visit: Payer: No Typology Code available for payment source | Attending: Radiation Oncology

## 2019-07-04 DIAGNOSIS — R600 Localized edema: Secondary | ICD-10-CM | POA: Diagnosis present

## 2019-07-04 DIAGNOSIS — C8335 Diffuse large B-cell lymphoma, lymph nodes of inguinal region and lower limb: Secondary | ICD-10-CM

## 2019-07-04 DIAGNOSIS — M7989 Other specified soft tissue disorders: Secondary | ICD-10-CM
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
## 2019-07-04 NOTE — Patient Instructions (Addendum)
Phlebolymphedema Instructions  Deep breathing throughout the day 3-5x take 5 deep breathes in and out  Breathe in and stomach should expand, breathe out stomach should push in.   Elevate your legs whenever you are sitting longer than 20 minutes  Massage the lymph nodes on the inside of your upper thigh and behind your knees 10x,  3x/day.    Access Code: [email or web address] URL: [email or web address] Date: 07/04/2019 Prepared by: Tomma Rakers  Exercises Standing Upper Cervical Flexion and Extension - 1 x daily - 7 x weekly - 20 reps - 1 sets Standing Cervical Sidebending AROM - 1 x daily - 7 x weekly - 20 reps - 1 sets Standing Cervical Rotation AROM - 1 x daily - 7 x weekly - 20 reps - 1 sets Standing Shoulder Flexion Full Range - 1 x daily - 7 x weekly - 20 reps - 1 sets Standing Shoulder Shrugs - 1 x daily - 7 x weekly - 20 reps - 1 sets Standing Scapular Retraction - 1 x daily - 7 x weekly - 20 reps - 1 sets Trunk Sidebending with Compression Garment - 1 x daily - 7 x weekly - 20 reps - 1 sets Standing Hip Extension with Counter Support - 1 x daily - 7 x weekly - 20 reps - 1 sets Seated Knee Flexion Extension AROM - 1 x daily - 7 x weekly - 20 reps - 1 sets Heel Toe Raises with Counter Support - 1 x daily - 7 x weekly - 20 reps - 1 sets

## 2019-07-05 ENCOUNTER — Encounter (HOSPITAL_COMMUNITY): Payer: Self-pay | Admitting: Internal Medicine

## 2019-07-05 ENCOUNTER — Other Ambulatory Visit: Payer: Self-pay

## 2019-07-05 ENCOUNTER — Ambulatory Visit (HOSPITAL_BASED_OUTPATIENT_CLINIC_OR_DEPARTMENT_OTHER)
Admission: RE | Admit: 2019-07-05 | Discharge: 2019-07-05 | Disposition: A | Discharge status: Home / Self Care | Payer: No Typology Code available for payment source | Source: Ambulatory Visit | Attending: Internal Medicine | Admitting: Internal Medicine

## 2019-07-05 VITALS — BP 92/60 | HR 99

## 2019-07-05 DIAGNOSIS — Z79899 Other long term (current) drug therapy: Secondary | ICD-10-CM | POA: Insufficient documentation

## 2019-07-05 DIAGNOSIS — I340 Nonrheumatic mitral (valve) insufficiency: Secondary | ICD-10-CM | POA: Insufficient documentation

## 2019-07-05 DIAGNOSIS — I2510 Atherosclerotic heart disease of native coronary artery without angina pectoris: Secondary | ICD-10-CM

## 2019-07-05 DIAGNOSIS — C8335 Diffuse large B-cell lymphoma, lymph nodes of inguinal region and lower limb: Secondary | ICD-10-CM | POA: Diagnosis not present

## 2019-07-05 DIAGNOSIS — Z7952 Long term (current) use of systemic steroids: Secondary | ICD-10-CM | POA: Insufficient documentation

## 2019-07-05 DIAGNOSIS — Z7901 Long term (current) use of anticoagulants: Secondary | ICD-10-CM | POA: Insufficient documentation

## 2019-07-05 DIAGNOSIS — I480 Paroxysmal atrial fibrillation: Secondary | ICD-10-CM

## 2019-07-05 DIAGNOSIS — R531 Weakness: Secondary | ICD-10-CM | POA: Diagnosis not present

## 2019-07-05 DIAGNOSIS — Z7902 Long term (current) use of antithrombotics/antiplatelets: Secondary | ICD-10-CM | POA: Insufficient documentation

## 2019-07-05 DIAGNOSIS — G40909 Epilepsy, unspecified, not intractable, without status epilepticus: Secondary | ICD-10-CM | POA: Insufficient documentation

## 2019-07-05 DIAGNOSIS — I5042 Chronic combined systolic (congestive) and diastolic (congestive) heart failure: Secondary | ICD-10-CM | POA: Insufficient documentation

## 2019-07-05 DIAGNOSIS — J449 Chronic obstructive pulmonary disease, unspecified: Secondary | ICD-10-CM | POA: Insufficient documentation

## 2019-07-05 DIAGNOSIS — J9601 Acute respiratory failure with hypoxia: Secondary | ICD-10-CM | POA: Diagnosis not present

## 2019-07-05 DIAGNOSIS — I4891 Unspecified atrial fibrillation: Secondary | ICD-10-CM | POA: Insufficient documentation

## 2019-07-05 DIAGNOSIS — D709 Neutropenia, unspecified: Secondary | ICD-10-CM | POA: Diagnosis not present

## 2019-07-05 DIAGNOSIS — I252 Old myocardial infarction: Secondary | ICD-10-CM | POA: Insufficient documentation

## 2019-07-05 DIAGNOSIS — Z9861 Coronary angioplasty status: Secondary | ICD-10-CM

## 2019-07-05 DIAGNOSIS — Z955 Presence of coronary angioplasty implant and graft: Secondary | ICD-10-CM | POA: Insufficient documentation

## 2019-07-05 DIAGNOSIS — Z8572 Personal history of non-Hodgkin lymphomas: Secondary | ICD-10-CM | POA: Insufficient documentation

## 2019-07-05 DIAGNOSIS — C8590 Non-Hodgkin lymphoma, unspecified, unspecified site: Secondary | ICD-10-CM | POA: Diagnosis not present

## 2019-07-05 DIAGNOSIS — Z923 Personal history of irradiation: Secondary | ICD-10-CM | POA: Insufficient documentation

## 2019-07-05 DIAGNOSIS — Z87891 Personal history of nicotine dependence: Secondary | ICD-10-CM | POA: Insufficient documentation

## 2019-07-05 DIAGNOSIS — R7989 Other specified abnormal findings of blood chemistry: Secondary | ICD-10-CM | POA: Insufficient documentation

## 2019-07-05 DIAGNOSIS — I255 Ischemic cardiomyopathy: Secondary | ICD-10-CM | POA: Insufficient documentation

## 2019-07-05 DIAGNOSIS — Z9221 Personal history of antineoplastic chemotherapy: Secondary | ICD-10-CM | POA: Insufficient documentation

## 2019-07-05 DIAGNOSIS — Z8249 Family history of ischemic heart disease and other diseases of the circulatory system: Secondary | ICD-10-CM | POA: Insufficient documentation

## 2019-07-05 DIAGNOSIS — A419 Sepsis, unspecified organism: Secondary | ICD-10-CM | POA: Diagnosis not present

## 2019-07-05 DIAGNOSIS — I4892 Unspecified atrial flutter: Secondary | ICD-10-CM | POA: Insufficient documentation

## 2019-07-05 LAB — CBC
HCT: 24.7 % — ABNORMAL LOW (ref 39.0–52.0)
Hemoglobin: 7.5 g/dL — ABNORMAL LOW (ref 13.0–17.0)
MCH: 27.1 pg (ref 26.0–34.0)
MCHC: 30.4 g/dL (ref 30.0–36.0)
MCV: 89.2 fL (ref 80.0–100.0)
Platelets: 123 10*3/uL — ABNORMAL LOW (ref 150–400)
RBC: 2.77 MIL/uL — ABNORMAL LOW (ref 4.22–5.81)
RDW: 19.1 % — ABNORMAL HIGH (ref 11.5–15.5)
WBC: 6.9 10*3/uL (ref 4.0–10.5)
nRBC: 0 % (ref 0.0–0.2)

## 2019-07-05 LAB — COMPREHENSIVE METABOLIC PANEL
ALT: 28 U/L (ref 0–44)
AST: 20 U/L (ref 15–41)
Albumin: 2.3 g/dL — ABNORMAL LOW (ref 3.5–5.0)
Alkaline Phosphatase: 67 U/L (ref 38–126)
Anion gap: 6 (ref 5–15)
BUN: 17 mg/dL (ref 8–23)
CO2: 27 mmol/L (ref 22–32)
Calcium: 8.3 mg/dL — ABNORMAL LOW (ref 8.9–10.3)
Chloride: 105 mmol/L (ref 98–111)
Creatinine, Ser: 0.83 mg/dL (ref 0.61–1.24)
GFR calc Af Amer: >60 mL/min (ref >60)
GFR calc non Af Amer: >60 mL/min (ref >60)
Glucose, Bld: 87 mg/dL (ref 70–99)
Potassium: 4.3 mmol/L (ref 3.5–5.1)
Sodium: 138 mmol/L (ref 135–145)
Total Bilirubin: 0.7 mg/dL (ref 0.3–1.2)
Total Protein: 4.3 g/dL — ABNORMAL LOW (ref 6.5–8.1)

## 2019-07-05 LAB — PREALBUMIN: Prealbumin: 16 mg/dL — ABNORMAL LOW (ref 18–38)

## 2019-07-05 LAB — BRAIN NATRIURETIC PEPTIDE: B Natriuretic Peptide: 404.8 pg/mL — ABNORMAL HIGH (ref 0.0–100.0)

## 2019-07-05 MED ORDER — POTASSIUM CHLORIDE CRYS ER 20 MEQ PO TBCR
20.0000 meq | EXTENDED_RELEASE_TABLET | Freq: Every day | ORAL | 3 refills | Status: AC
Start: 2019-07-05 — End: ?

## 2019-07-05 MED ORDER — MIDODRINE HCL 5 MG PO TABS: 5.0000 mg | ORAL_TABLET | Freq: Three times a day (TID) | ORAL | 3 refills | Status: DC

## 2019-07-05 MED ORDER — DIGOXIN 125 MCG PO TABS
125.0000 ug | ORAL_TABLET | Freq: Every day | ORAL | 11 refills | Status: DC
Start: 2019-07-05 — End: 2019-09-09

## 2019-07-05 MED ORDER — FUROSEMIDE 20 MG PO TABS
20.0000 mg | ORAL_TABLET | Freq: Every day | ORAL | 3 refills | Status: DC
Start: 2019-07-05 — End: 2019-07-25

## 2019-07-05 NOTE — Progress Notes (Signed)
ADVANCED HF CLINIC NOTE  Referring Physician: Primary Care: Primary Cardiologist:  HPI:  Tony Saunders is a 64 y.o. male with a hx of CAD, COPD, seizure disorder, ischemic cardiomyopathy/chronic systolic heart failure and paroxysmal atrial fibrillation who is being seen today for the evaluation of HF at the request of Dr. Ellyn Hack.   Patient has history of CAD s/p RCA PCI x2 in 2006.  Most recently patient admitted 03/2019 with acute inferior STEMI.Catheterization revealed patent proximal stents in the RCA with distal occlusion of the RCA which was felt to be the culprit vessel. He also had high-grade stenosis in the circumflex and LAD. His EF was 30 to 35% with moderate to severe MR. Patient underwent urgent intervention to the RCA with multiple stents. He essentially now has a full metal jacket in the RCA. He was brought back on 03/29/2019 for staged intervention of the circumflex which received 3 stents and the LAD which received a stent. His hospital course was complicated by PAF which was brief and converted spontaneously. He did had elevated LFTs in the hospital. There is a history of some alcohol use although he says he is not drinking anymore. Because of his elevated LFTs he was sent home on low-dose Lipitor, this was later stopped altogether 1 follow-up LFTs were still elevated. LFTs have subsequently normalized.   Patient was seen by Kerin Ransom April 16, 2019 for hospital follow-up.  He was noted in atrial fibrillation and anticoagulation started with Eliquis later changed to Xarelto.  He has been compliant with his anticoagulation. He was hypotensive and subsequently required discontinuation of losartan and reduction of Coreg.  Most recent echocardiogram May 14, 2019 showed persistent low EF at 35-40% (read as 30%) with severe MR   He was readmitted 5/4-05/25/19 for LLE pain and swelling. On arrival to ER was in atrial flutter with RVR and was cardioverted.   Since patient  had left lower extremity swelling patient underwent Dopplers of the lower extremity which was negative for DVT but did show lymphadenopathy concerning. CT abdomen pelvis was done which showed high-grade/aggressive lymphoma with extensive retroperitoneal, pelvic, and inguinal lymphadenopathy.  There is also involvement of the spleen and near complete infiltration of the right kidney.  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  Suspicious for infiltrating disease of the right iliac and left pubic bones.   Admitted to [facility]/28-6/12/21. Received chemo and radiation. Course c/b by low output HF and refractory AF. Was on milrinone. Had DC-CV x2 .   He is here with his wife for f/u. Feels tired. Weak. Denies SOB, orthopnea or PND. LE edema somewhat better. No dizziness. Appetite poor. Taking Ensure. No CP    Past Medical History:  Diagnosis Date  . 3vessel CAD- S/P PCI 03/27/2019   Remote pRCA PCI-stenting 2006. Acute MI 03/27/2019 treated with urgent m-d RCA PCI and stent (x 2) followed by staged PCI DES to CFX (x2 stents) and LAD (x1) on 03/29/2019  . Acute ST elevation myocardial infarction (STEMI) of inferior wall ([place]) 03/27/2019   Pt presented 03/27/2019 with an acute inferior MI- Cath 03/27/19 showed thrombotic occlusion of mid-distal RCA with a patent previously placed pRCA stent -2 overlapping Synergy DES from PDA back into proximal RCA stented segment and PTCA of RPAV (jailed)-PL 3 He also had concomitant high garde CFX and LAD disease (staged PCI) with an EF of 30-35%  . Chronic combined systolic and diastolic CHF, NYHA class 2 and ACC/AHA stage C ([place]) 03/27/2019   EF  30 to 40% with diffuse inferior hypokinesis/akinesis; severe ischemic MR  . Chronic combined systolic and diastolic heart failure ([place]) 06/13/2019  . 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 Myocardial infarction [facility]) 2006   PCI of the RCA  . PAF (persistent-paroxysmal atrial fibrillation)  ([place]) 04/16/2019   Brief- post MI  . Seizure, temporal lobe ([place]) 2017   most recent 07/23/15-on Keppra and Lamictal  . Severe mitral regurgitation by prior echocardiogram-likely ischemic with tethered posterior leaflet 03/31/2019   Repeat echo 4-6 weeks post MI    Current Outpatient Medications  Medication Sig Dispense Refill  . allopurinol (ZYLOPRIM) 300 MG tablet Take 1 tablet (300 mg total) by mouth daily. 60 tablet 0  . amiodarone (PACERONE) 200 MG tablet Take 200 mg PO BID x 2 wk then 200 mg ONCE daily. 40 tablet 0  . calcium carbonate (TUMS - DOSED IN MG ELEMENTAL CALCIUM) 500 MG chewable tablet Chew 1 tablet (200 mg of elemental calcium total) by mouth 3 (three) times daily. 90 tablet 0  . feeding supplement, ENSURE ENLIVE, (ENSURE ENLIVE) LIQD Take 237 mLs by mouth 2 (two) times daily between meals. 237 mL 12  . lamoTRIgine (LAMICTAL) 100 MG tablet Take 1 tablet (100 mg total) by mouth 2 (two) times daily. 180 tablet 3  . levETIRAcetam (KEPPRA) 750 MG tablet Take 1 tablet (750 mg total) by mouth 2 (two) times daily. 180 tablet 3  . lidocaine-prilocaine (EMLA) cream Apply 1 application topically as needed. 30 g 0  . magnesium oxide (MAG-OX) 400 (241.3 Mg) MG tablet Take 1 tablet (400 mg total) by mouth 2 (two) times daily for 7 days. 14 tablet 0  . midodrine (PROAMATINE) 2.5 MG tablet Take 1 tablet (2.5 mg total) by mouth 3 (three) times daily with meals. 90 tablet 3  . mirtazapine (REMERON SOL-TAB) 15 MG disintegrating tablet Take 1 tablet (15 mg total) by mouth at bedtime. 30 tablet 0  . nitroGLYCERIN (NITROSTAT) 0.4 MG SL tablet Place 1 tablet (0.4 mg total) under the tongue every 5 (five) minutes as needed for chest pain. 25 tablet 4  . ondansetron (ZOFRAN) 4 MG tablet Take 1 tablet (4 mg total) by mouth every 6 (six) hours as needed for nausea. 20 tablet 0  . pantoprazole (PROTONIX) 40 MG tablet Take 1 tablet (40 mg total) by mouth daily. 30 tablet 3  . prochlorperazine (COMPAZINE) 10  MG tablet Take 1 tablet (10 mg total) by mouth every 6 (six) hours as needed for nausea or vomiting. 30 tablet 0  . rivaroxaban (XARELTO) 20 MG TABS tablet Take 1 tablet (20 mg total) by mouth daily with supper. (Patient taking differently: Take 20 mg by mouth daily at 8 pm. ) 30 tablet 11  . rosuvastatin (CRESTOR) 5 MG tablet Take 1 tablet (5 mg total) by mouth daily. (Patient taking differently: Take 5 mg by mouth at bedtime. ) 30 tablet 6  . sertraline (ZOLOFT) 25 MG tablet Take 1 tablet (25 mg total) by mouth daily. 90 tablet 3  . ticagrelor (BRILINTA) 90 MG TABS tablet Take 90 mg by mouth 2 (two) times daily. Finish prescription and then switch to Plavix    . clopidogrel (PLAVIX) 75 MG tablet Take 1 tablet (75 mg total) by mouth daily. (Patient not taking: Reported on 07/05/2019) 90 tablet 3  . predniSONE (DELTASONE) 50 MG tablet Take 2 tablets for 5 days every 21 days with chemotherapy. (Patient not taking: Reported on  07/05/2019) 60 tablet 1   No current facility-administered medications for this encounter.    No Known Allergies    Social History   Socioeconomic History  . Marital status: Married    Spouse name: [name]  . Number of children: 3  . 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 Smoking status: Former Smoker    Types: Cigarettes    Quit date: 01/22/2018    Years since quitting: 1.4  . Smokeless tobacco: Never Used  . Tobacco comment: 20  Vaping Use  . Vaping Use: Never used  Substance and Sexual Activity  . Alcohol use: Yes    Alcohol/week: 0.0 standard drinks    Comment: 2-5 beers daily  . Drug use: No  . Sexual activity: Yes  Other Topics Concern  . Not on file  Social History Narrative   Former [place] [employer]   Lives with wife   Right-handed   Caffeine: 3-4 cups per day   Social Determinants of Health   Financial Resource Strain:   . Difficulty of Paying  Living Expenses:   Food Insecurity:   . Worried About Charity fundraiser in the Last Year:   . Arboriculturist in the Last Year:   Transportation Needs:   . Film/video editor (Medical):   Marland Kitchen Lack of Transportation (Non-Medical):   Physical Activity:   . Days of Exercise per Week:   . Minutes of Exercise per Session:   Stress:   . Feeling of Stress :   Social Connections:   . Frequency of Communication with Friends and Family:   . Frequency of Social Gatherings with Friends and Family:   . Attends Religious Services:   . Active Member of Clubs or Organizations:   . Attends Archivist Meetings:   Marland Kitchen Marital Status:   Intimate Partner Violence:   . Fear of Current or Ex-Partner:   . Emotionally Abused:   Marland Kitchen Physically Abused:   . Sexually Abused:       Family History  Problem Relation Age of Onset  . Heart disease Father   . Healthy Mother   . Heart disease Paternal Grandfather     Vitals:   07/05/19 1050  BP: 92/60  Pulse: 99  SpO2: 95%   .  PHYSICAL EXAM: General:  Weak and cachetic appearing. No respiratory difficulty HEENT: normal Neck: supple. JVP 5-6. Carotids 2+ bilat; no bruits. No lymphadenopathy or thryomegaly appreciated. Cor: PMI nondisplaced. Irregular rate & rhythm. 2/6 MR Lungs: clear Abdomen: soft, nontender, +distended. No hepatosplenomegaly. No bruits or masses. Good bowel sounds. Extremities: no cyanosis, clubbing, rash, 3+ edema on R 4+ on L + multiple ecchymosis. RUE PICC Neuro: alert & orientedx3, cranial nerves grossly intact. moves all 4 extremities w/o difficulty. Affect pleasant   ECG: AF 99 Non-specific ST-T wave abnormalities.   Personally reviewed    ASSESSMENT & PLAN:  1. Chronic systolic heart failure due to ischemic CM - Most recent echocardiogram May 14, 2019 read as EF at 30% (I suspect closer to 35-40%) with severe ischemic MR - recently required milrinone support - GDMT limited by low BP - NYHA IIIB-IV -  Volume status mildly elevated - Increase midodrine to 5 tid - Stop b-blocker - Add dig 0.125 - Start lasix 20mg  daily with KCL 20  2. Atrial flutter/fibrillation - failed DC-CV several times recently -  rate with reasonable control on amio 200 bid. Continue for now. Add digoxin 0.125 - currently on Xarelto and Plavix No obvious bleeding - can consider repeat DC-CV when more stable   3. CAD s/p recent MI with multiple stents in 3/21 (RCA x 2, LCX x 3, LAD x 1) - No s/s angina - antiplatelet discussion as above. Off statin for now with elevated LFTs. Can restart in future  4.  Severe mitral regurgitation -Medical management for niow. May be possible MitraClip candidate depending on lymphoma course  5.  Recently diagnosed high-grade lymphoma - extensive retroperitoneal, pelvic, and inguinal lymphadenopathy.  There is also involvement of the spleen and near complete infiltration of the right kidney.  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  Suspicious for infiltrating disease of the right iliac and left pubic bones.  - pathology c/w Monoclonal B-cell population with co-expression of CD10 comprises 65%  of all lymphocytes  - chemo/XRT initiated this month  Total time spent 35 minutes. Over half that time spent discussing above.    Glori Bickers, MD  11:31 AM

## 2019-07-05 NOTE — Patient Instructions (Signed)
Labs done today. We will contact you only if your labs are abnormal.  INCREASE Midodrine 5mg (1 tablet) by mouth 3 times daily  START Digoxin 0.159mcg(1 tablet) by mouth daily  START Furosemide(Lasix) 20mg (1 tablet) by mouth daily  START Potassium 56meq(1 tablet) by mouth daily  No other medication changes were made. Please continue all other medications as prescribed.  Please remember to apply ace wraps to your legs to help with swelling/fluid retention.  Your physician recommends that you schedule a follow-up appointment in: 6-8 weeks with Dr. Haroldine Laws.  If you have any questions or concerns before your next appointment please send [place] a message through [place] or call our office at [phone].    TO LEAVE A MESSAGE FOR THE NURSE SELECT OPTION 2, PLEASE LEAVE A MESSAGE INCLUDING: . YOUR NAME . DATE OF BIRTH . CALL BACK NUMBER . REASON FOR CALL**this is important as we prioritize the call backs  YOU WILL RECEIVE A CALL BACK THE SAME DAY AS LONG AS YOU CALL BEFORE 4:00 PM   Do the following things EVERYDAY: 1) Weigh yourself in the morning before breakfast. Write it down and keep it in a log. 2) Take your medicines as prescribed 3) Eat low salt foods--Limit salt (sodium) to 2000 mg per day.  4) Stay as active as you can everyday 5) Limit all fluids for the day to less than 2 liters   At the [facility], you and your health needs are our priority. As part of our continuing mission to provide you with exceptional heart care, we have created designated Provider Care Teams. These Care Teams include your primary Cardiologist (physician) and Advanced Practice Providers (APPs- Physician Assistants and Nurse Practitioners) who all work together to provide you with the care you need, when you need it.   You may see any of the following providers on your designated Care Team at your next follow up: Marland Kitchen Dr Glori Bickers . Dr Loralie Champagne . Darrick Grinder, NP . Lyda Jester, PA . Audry Riles, PharmD   Please be sure to bring in all your medications bottles to every appointment.

## 2019-07-08 ENCOUNTER — Other Ambulatory Visit: Payer: Self-pay | Admitting: Oncology

## 2019-07-08 ENCOUNTER — Other Ambulatory Visit: Payer: Self-pay

## 2019-07-08 ENCOUNTER — Inpatient Hospital Stay (HOSPITAL_COMMUNITY)
Admission: EM | Admit: 2019-07-08 | Discharge: 2019-07-25 | DRG: 871 | Disposition: A | Discharge status: Home / Self Care | Payer: No Typology Code available for payment source | Attending: Family Medicine | Admitting: Family Medicine

## 2019-07-08 ENCOUNTER — Encounter (HOSPITAL_COMMUNITY): Payer: Self-pay | Admitting: *Deleted

## 2019-07-08 ENCOUNTER — Emergency Department (HOSPITAL_COMMUNITY): Payer: No Typology Code available for payment source

## 2019-07-08 DIAGNOSIS — I4819 Other persistent atrial fibrillation: Secondary | ICD-10-CM | POA: Diagnosis present

## 2019-07-08 DIAGNOSIS — R64 Cachexia: Secondary | ICD-10-CM | POA: Diagnosis present

## 2019-07-08 DIAGNOSIS — J9690 Respiratory failure, unspecified, unspecified whether with hypoxia or hypercapnia: Secondary | ICD-10-CM | POA: Diagnosis not present

## 2019-07-08 DIAGNOSIS — D649 Anemia, unspecified: Secondary | ICD-10-CM | POA: Diagnosis not present

## 2019-07-08 DIAGNOSIS — D709 Neutropenia, unspecified: Secondary | ICD-10-CM | POA: Diagnosis not present

## 2019-07-08 DIAGNOSIS — R627 Adult failure to thrive: Secondary | ICD-10-CM | POA: Diagnosis present

## 2019-07-08 DIAGNOSIS — Z87891 Personal history of nicotine dependence: Secondary | ICD-10-CM

## 2019-07-08 DIAGNOSIS — L89151 Pressure ulcer of sacral region, stage 1: Secondary | ICD-10-CM | POA: Diagnosis present

## 2019-07-08 DIAGNOSIS — E875 Hyperkalemia: Secondary | ICD-10-CM | POA: Diagnosis not present

## 2019-07-08 DIAGNOSIS — C8330 Diffuse large B-cell lymphoma, unspecified site: Secondary | ICD-10-CM | POA: Diagnosis not present

## 2019-07-08 DIAGNOSIS — D6959 Other secondary thrombocytopenia: Secondary | ICD-10-CM | POA: Diagnosis present

## 2019-07-08 DIAGNOSIS — I484 Atypical atrial flutter: Secondary | ICD-10-CM | POA: Diagnosis present

## 2019-07-08 DIAGNOSIS — I5043 Acute on chronic combined systolic (congestive) and diastolic (congestive) heart failure: Secondary | ICD-10-CM | POA: Diagnosis present

## 2019-07-08 DIAGNOSIS — A419 Sepsis, unspecified organism: Principal | ICD-10-CM | POA: Diagnosis present

## 2019-07-08 DIAGNOSIS — R579 Shock, unspecified: Secondary | ICD-10-CM

## 2019-07-08 DIAGNOSIS — I358 Other nonrheumatic aortic valve disorders: Secondary | ICD-10-CM | POA: Diagnosis present

## 2019-07-08 DIAGNOSIS — R5081 Fever presenting with conditions classified elsewhere: Secondary | ICD-10-CM | POA: Diagnosis present

## 2019-07-08 DIAGNOSIS — K567 Ileus, unspecified: Secondary | ICD-10-CM | POA: Diagnosis present

## 2019-07-08 DIAGNOSIS — L8990 Pressure ulcer of unspecified site, unspecified stage: Secondary | ICD-10-CM

## 2019-07-08 DIAGNOSIS — T451X5A Adverse effect of antineoplastic and immunosuppressive drugs, initial encounter: Secondary | ICD-10-CM | POA: Diagnosis present

## 2019-07-08 DIAGNOSIS — Z6824 Body mass index (BMI) 24.0-24.9, adult: Secondary | ICD-10-CM

## 2019-07-08 DIAGNOSIS — R570 Cardiogenic shock: Secondary | ICD-10-CM | POA: Diagnosis present

## 2019-07-08 DIAGNOSIS — R6520 Severe sepsis without septic shock: Secondary | ICD-10-CM

## 2019-07-08 DIAGNOSIS — Z8249 Family history of ischemic heart disease and other diseases of the circulatory system: Secondary | ICD-10-CM

## 2019-07-08 DIAGNOSIS — I340 Nonrheumatic mitral (valve) insufficiency: Secondary | ICD-10-CM | POA: Diagnosis present

## 2019-07-08 DIAGNOSIS — I493 Ventricular premature depolarization: Secondary | ICD-10-CM | POA: Diagnosis not present

## 2019-07-08 DIAGNOSIS — Z20822 Contact with and (suspected) exposure to covid-19: Secondary | ICD-10-CM | POA: Diagnosis present

## 2019-07-08 DIAGNOSIS — I255 Ischemic cardiomyopathy: Secondary | ICD-10-CM | POA: Diagnosis present

## 2019-07-08 DIAGNOSIS — Z7902 Long term (current) use of antithrombotics/antiplatelets: Secondary | ICD-10-CM

## 2019-07-08 DIAGNOSIS — Z515 Encounter for palliative care: Secondary | ICD-10-CM | POA: Diagnosis not present

## 2019-07-08 DIAGNOSIS — C8335 Diffuse large B-cell lymphoma, lymph nodes of inguinal region and lower limb: Secondary | ICD-10-CM | POA: Diagnosis present

## 2019-07-08 DIAGNOSIS — R042 Hemoptysis: Secondary | ICD-10-CM | POA: Diagnosis not present

## 2019-07-08 DIAGNOSIS — B9789 Other viral agents as the cause of diseases classified elsewhere: Secondary | ICD-10-CM | POA: Diagnosis present

## 2019-07-08 DIAGNOSIS — R54 Age-related physical debility: Secondary | ICD-10-CM | POA: Diagnosis present

## 2019-07-08 DIAGNOSIS — D61810 Antineoplastic chemotherapy induced pancytopenia: Secondary | ICD-10-CM | POA: Diagnosis present

## 2019-07-08 DIAGNOSIS — E43 Unspecified severe protein-calorie malnutrition: Secondary | ICD-10-CM | POA: Diagnosis present

## 2019-07-08 DIAGNOSIS — R6521 Severe sepsis with septic shock: Secondary | ICD-10-CM | POA: Diagnosis not present

## 2019-07-08 DIAGNOSIS — B370 Candidal stomatitis: Secondary | ICD-10-CM | POA: Diagnosis present

## 2019-07-08 DIAGNOSIS — D61818 Other pancytopenia: Secondary | ICD-10-CM

## 2019-07-08 DIAGNOSIS — C8590 Non-Hodgkin lymphoma, unspecified, unspecified site: Secondary | ICD-10-CM | POA: Diagnosis not present

## 2019-07-08 DIAGNOSIS — G8190 Hemiplegia, unspecified affecting unspecified side: Secondary | ICD-10-CM | POA: Diagnosis not present

## 2019-07-08 DIAGNOSIS — D696 Thrombocytopenia, unspecified: Secondary | ICD-10-CM | POA: Diagnosis not present

## 2019-07-08 DIAGNOSIS — I252 Old myocardial infarction: Secondary | ICD-10-CM

## 2019-07-08 DIAGNOSIS — E876 Hypokalemia: Secondary | ICD-10-CM | POA: Diagnosis present

## 2019-07-08 DIAGNOSIS — J440 Chronic obstructive pulmonary disease with acute lower respiratory infection: Secondary | ICD-10-CM | POA: Diagnosis present

## 2019-07-08 DIAGNOSIS — J9601 Acute respiratory failure with hypoxia: Secondary | ICD-10-CM

## 2019-07-08 DIAGNOSIS — Z7189 Other specified counseling: Secondary | ICD-10-CM | POA: Diagnosis not present

## 2019-07-08 DIAGNOSIS — R531 Weakness: Secondary | ICD-10-CM | POA: Diagnosis present

## 2019-07-08 DIAGNOSIS — I959 Hypotension, unspecified: Secondary | ICD-10-CM | POA: Diagnosis not present

## 2019-07-08 DIAGNOSIS — B3789 Other sites of candidiasis: Secondary | ICD-10-CM | POA: Diagnosis present

## 2019-07-08 DIAGNOSIS — Z955 Presence of coronary angioplasty implant and graft: Secondary | ICD-10-CM

## 2019-07-08 DIAGNOSIS — J9621 Acute and chronic respiratory failure with hypoxia: Secondary | ICD-10-CM | POA: Diagnosis present

## 2019-07-08 DIAGNOSIS — Z0184 Encounter for antibody response examination: Secondary | ICD-10-CM

## 2019-07-08 DIAGNOSIS — J90 Pleural effusion, not elsewhere classified: Secondary | ICD-10-CM

## 2019-07-08 DIAGNOSIS — G40909 Epilepsy, unspecified, not intractable, without status epilepticus: Secondary | ICD-10-CM | POA: Diagnosis present

## 2019-07-08 DIAGNOSIS — I110 Hypertensive heart disease with heart failure: Secondary | ICD-10-CM | POA: Diagnosis present

## 2019-07-08 DIAGNOSIS — J1289 Other viral pneumonia: Secondary | ICD-10-CM | POA: Diagnosis present

## 2019-07-08 DIAGNOSIS — R590 Localized enlarged lymph nodes: Secondary | ICD-10-CM | POA: Diagnosis present

## 2019-07-08 DIAGNOSIS — E785 Hyperlipidemia, unspecified: Secondary | ICD-10-CM | POA: Diagnosis present

## 2019-07-08 DIAGNOSIS — R918 Other nonspecific abnormal finding of lung field: Secondary | ICD-10-CM

## 2019-07-08 DIAGNOSIS — I9589 Other hypotension: Secondary | ICD-10-CM

## 2019-07-08 DIAGNOSIS — I2510 Atherosclerotic heart disease of native coronary artery without angina pectoris: Secondary | ICD-10-CM | POA: Diagnosis present

## 2019-07-08 DIAGNOSIS — I5023 Acute on chronic systolic (congestive) heart failure: Secondary | ICD-10-CM | POA: Diagnosis not present

## 2019-07-08 DIAGNOSIS — Z79899 Other long term (current) drug therapy: Secondary | ICD-10-CM

## 2019-07-08 DIAGNOSIS — R040 Epistaxis: Secondary | ICD-10-CM | POA: Diagnosis not present

## 2019-07-08 DIAGNOSIS — L89001 Pressure ulcer of unspecified elbow, stage 1: Secondary | ICD-10-CM | POA: Diagnosis not present

## 2019-07-08 LAB — BRAIN NATRIURETIC PEPTIDE: B Natriuretic Peptide: 597.3 pg/mL — ABNORMAL HIGH (ref 0.0–100.0)

## 2019-07-08 LAB — COMPREHENSIVE METABOLIC PANEL
ALT: 23 U/L (ref 0–44)
AST: 23 U/L (ref 15–41)
Albumin: 2 g/dL — ABNORMAL LOW (ref 3.5–5.0)
Alkaline Phosphatase: 44 U/L (ref 38–126)
Anion gap: 8 (ref 5–15)
BUN: 15 mg/dL (ref 8–23)
CO2: 25 mmol/L (ref 22–32)
Calcium: 7.6 mg/dL — ABNORMAL LOW (ref 8.9–10.3)
Chloride: 104 mmol/L (ref 98–111)
Creatinine, Ser: 0.9 mg/dL (ref 0.61–1.24)
GFR calc Af Amer: >60 mL/min (ref >60)
GFR calc non Af Amer: >60 mL/min (ref >60)
Glucose, Bld: 93 mg/dL (ref 70–99)
Potassium: 3.1 mmol/L — ABNORMAL LOW (ref 3.5–5.1)
Sodium: 137 mmol/L (ref 135–145)
Total Bilirubin: 0.8 mg/dL (ref 0.3–1.2)
Total Protein: 4.1 g/dL — ABNORMAL LOW (ref 6.5–8.1)

## 2019-07-08 LAB — TROPONIN I (HIGH SENSITIVITY)
Troponin I (High Sensitivity): 96 ng/L — ABNORMAL HIGH (ref <18)
Troponin I (High Sensitivity): 96 ng/L — ABNORMAL HIGH (ref <18)

## 2019-07-08 LAB — CBC WITH DIFFERENTIAL/PLATELET
Abs Immature Granulocytes: 0 10*3/uL (ref 0.00–0.07)
Basophils Absolute: 0 10*3/uL (ref 0.0–0.1)
Basophils Relative: 0 %
Eosinophils Absolute: 0 10*3/uL (ref 0.0–0.5)
Eosinophils Relative: 10 %
HCT: 14.6 % — ABNORMAL LOW (ref 39.0–52.0)
Hemoglobin: 4.6 g/dL — CL (ref 13.0–17.0)
Immature Granulocytes: 0 %
Lymphocytes Relative: 20 %
Lymphs Abs: 0 10*3/uL — ABNORMAL LOW (ref 0.7–4.0)
MCH: 26.9 pg (ref 26.0–34.0)
MCHC: 31.5 g/dL (ref 30.0–36.0)
MCV: 85.4 fL (ref 80.0–100.0)
Monocytes Absolute: 0 10*3/uL — ABNORMAL LOW (ref 0.1–1.0)
Monocytes Relative: 20 %
Neutro Abs: 0.1 10*3/uL — ABNORMAL LOW (ref 1.7–7.7)
Neutrophils Relative %: 50 %
Platelets: 28 10*3/uL — CL (ref 150–400)
RBC: 1.71 MIL/uL — ABNORMAL LOW (ref 4.22–5.81)
RDW: 18.9 % — ABNORMAL HIGH (ref 11.5–15.5)
WBC: 0.1 10*3/uL — CL (ref 4.0–10.5)
nRBC: 0 % (ref 0.0–0.2)

## 2019-07-08 LAB — PROCALCITONIN: Procalcitonin: 1.15 ng/mL

## 2019-07-08 LAB — PROTIME-INR
INR: 1.8 — ABNORMAL HIGH (ref 0.8–1.2)
Prothrombin Time: 20.3 seconds — ABNORMAL HIGH (ref 11.4–15.2)

## 2019-07-08 LAB — MAGNESIUM: Magnesium: 1.4 mg/dL — ABNORMAL LOW (ref 1.7–2.4)

## 2019-07-08 LAB — LACTIC ACID, PLASMA
Lactic Acid, Venous: 2.7 mmol/L (ref 0.5–1.9)
Lactic Acid, Venous: 0.7 mmol/L (ref 0.5–1.9)

## 2019-07-08 LAB — DIGOXIN LEVEL: Digoxin Level: 0.6 ng/mL — ABNORMAL LOW (ref 0.8–2.0)

## 2019-07-08 LAB — CORTISOL: Cortisol, Plasma: 31.3 ug/dL

## 2019-07-08 LAB — POC OCCULT BLOOD, ED: Fecal Occult Bld: POSITIVE — AB

## 2019-07-08 LAB — PREPARE RBC (CROSSMATCH)

## 2019-07-08 LAB — SARS CORONAVIRUS 2 BY RT PCR (HOSPITAL ORDER, PERFORMED IN ~~LOC~~ HOSPITAL LAB): SARS Coronavirus 2: NEGATIVE

## 2019-07-08 MED ORDER — MAGNESIUM SULFATE 50 % IJ SOLN: 2.0000 g | Freq: Once | INTRAMUSCULAR | Status: DC

## 2019-07-08 MED ORDER — SODIUM CHLORIDE 0.9 % IV SOLN
2.0000 g | Freq: Once | INTRAVENOUS | Status: AC
  Administered 2019-07-08: 2 g via INTRAVENOUS
  Filled 2019-07-08: qty 2

## 2019-07-08 MED ORDER — DOCUSATE SODIUM 100 MG PO CAPS: 100.0000 mg | ORAL_CAPSULE | Freq: Two times a day (BID) | ORAL | Status: DC | PRN

## 2019-07-08 MED ORDER — METRONIDAZOLE IN NACL 5-0.79 MG/ML-% IV SOLN
500.0000 mg | Freq: Once | INTRAVENOUS | Status: AC
  Administered 2019-07-08: 500 mg via INTRAVENOUS
  Filled 2019-07-08: qty 100

## 2019-07-08 MED ORDER — SODIUM CHLORIDE 0.9 % IV SOLN
2.0000 g | Freq: Three times a day (TID) | INTRAVENOUS | Status: DC
  Filled 2019-07-08: qty 2

## 2019-07-08 MED ORDER — SODIUM CHLORIDE 0.9 % IV SOLN
750.0000 mg | Freq: Two times a day (BID) | INTRAVENOUS | Status: DC
  Filled 2019-07-08 (×2): qty 7.5

## 2019-07-08 MED ORDER — POTASSIUM CHLORIDE 10 MEQ/100ML IV SOLN
10.0000 meq | INTRAVENOUS | Status: AC
  Administered 2019-07-08: 10 meq via INTRAVENOUS
  Filled 2019-07-08: qty 100

## 2019-07-08 MED ORDER — VANCOMYCIN HCL 750 MG/150ML IV SOLN
750.0000 mg | Freq: Three times a day (TID) | INTRAVENOUS | Status: DC
  Administered 2019-07-09 – 2019-07-12 (×10): 750 mg via INTRAVENOUS
  Filled 2019-07-08 (×12): qty 150

## 2019-07-08 MED ORDER — VANCOMYCIN HCL IN DEXTROSE 1-5 GM/200ML-% IV SOLN: 1000.0000 mg | Freq: Once | INTRAVENOUS | Status: DC

## 2019-07-08 MED ORDER — SODIUM CHLORIDE 0.9 % IV BOLUS
1000.0000 mL | Freq: Once | INTRAVENOUS | Status: AC
  Administered 2019-07-08: 1000 mL via INTRAVENOUS

## 2019-07-08 MED ORDER — SODIUM CHLORIDE 0.9 % IV SOLN
10.0000 mL/h | Freq: Once | INTRAVENOUS | Status: AC
  Administered 2019-07-09: 10 mL/h via INTRAVENOUS

## 2019-07-08 MED ORDER — PANTOPRAZOLE SODIUM 40 MG IV SOLR
40.0000 mg | INTRAVENOUS | Status: DC
  Administered 2019-07-08 – 2019-07-23 (×16): 40 mg via INTRAVENOUS
  Filled 2019-07-08 (×16): qty 40

## 2019-07-08 MED ORDER — VANCOMYCIN HCL 1750 MG/350ML IV SOLN
1750.0000 mg | Freq: Once | INTRAVENOUS | Status: AC
  Administered 2019-07-08: 1750 mg via INTRAVENOUS
  Filled 2019-07-08: qty 350

## 2019-07-08 MED ORDER — POLYETHYLENE GLYCOL 3350 17 G PO PACK: 17.0000 g | PACK | Freq: Every day | ORAL | Status: DC | PRN

## 2019-07-08 MED ORDER — METRONIDAZOLE IN NACL 5-0.79 MG/ML-% IV SOLN
500.0000 mg | Freq: Three times a day (TID) | INTRAVENOUS | Status: DC
  Administered 2019-07-09 – 2019-07-12 (×9): 500 mg via INTRAVENOUS
  Filled 2019-07-08 (×14): qty 100

## 2019-07-08 MED ORDER — MAGNESIUM SULFATE 2 GM/50ML IV SOLN
2.0000 g | Freq: Once | INTRAVENOUS | Status: AC
  Administered 2019-07-09: 2 g via INTRAVENOUS
  Filled 2019-07-08: qty 50

## 2019-07-08 MED ORDER — HYDROCORTISONE NA SUCCINATE PF 100 MG IJ SOLR
100.0000 mg | Freq: Three times a day (TID) | INTRAMUSCULAR | Status: DC
  Administered 2019-07-08 – 2019-07-14 (×17): 100 mg via INTRAVENOUS
  Filled 2019-07-08 (×17): qty 2

## 2019-07-08 NOTE — ED Notes (Signed)
Pt signed electronic consent for blood transfusion.

## 2019-07-08 NOTE — Progress Notes (Signed)
A consult was received from an ED physician for Vancomycin and Cefepime per pharmacy dosing.  The patient's profile has been reviewed for ht/wt/allergies/indication/available labs.    A one time order has been placed for Vancomycin 1750mg  IV and Cefepime 2g IV.  Further antibiotics/pharmacy consults should be ordered by admitting physician if indicated.                       Thank you, Luiz Ochoa 07/08/2019  2:03 PM

## 2019-07-08 NOTE — ED Notes (Signed)
crital care called notified pt wants to leave. They suggest AC talk to pt and family. Pt is alert and verbally abusive with staff

## 2019-07-08 NOTE — Progress Notes (Signed)
Contacted by Dr Melvyn Novas who is admitting this patient for management of neutropenic fever, inquiring whether adding neupogen would be beneficial at this point. Tony Saunders is a 64 y/o followed by Dr Alen Blew for diffuse large-cell non-Hodgkin's lymphoma. The pt received CHOP/Rituxan chemotherapy 06/29/2019 and neulasta 06/22/2019  I do not believe adding neupogen at this point would be helpful.  Will alert Dr Alen Blew re admission.

## 2019-07-08 NOTE — ED Notes (Signed)
Pt is a difficult stick. Unable to obtain a second set of blood cultures prior to starting abx.
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
## 2019-07-08 NOTE — ED Provider Notes (Signed)
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 ml LV SV MOD BP:      86.9 ml RIGHT VENTRICLE RV Basal diam:  3.40 cm RV S prime:     9.46 cm/s TAPSE (M-mode): 0.9 cm LEFT ATRIUM             Index       RIGHT ATRIUM           Index LA diam:        4.90 cm 2.43 cm/m  RA Area:     23.50 cm LA Vol (A2C):   70.0 ml 34.77 ml/m RA Volume:   79.10 ml  39.29 ml/m LA Vol (A4C):   78.3 ml 38.89 ml/m LA Biplane Vol: 82.1 ml 40.78 ml/m  AORTIC VALVE LVOT Vmax:   97.50 cm/s LVOT Vmean:  58.300 cm/s LVOT VTI:    0.147 m  AORTA Ao Root diam: 3.10 cm MR Peak grad:    94.7 mmHg MR Mean grad:    60.0 mmHg   SHUNTS MR Vmax:         486.50 cm/s Systemic VTI:  0.15 m MR Vmean:        362.0 cm/s  Systemic Diam: 2.00 cm MR PISA:         5.09 cm MR PISA Eff ROA: 32 mm MR PISA Radius:  0.90 cm [name] MD Electronically signed by [name] MD Signature Date/Time: 06/24/2019/1:10:34 PM    Final    VAS [place] LOWER EXTREMITY ARTERIAL DUPLEX  Result Date: 06/29/2019 LOWER EXTREMITY ARTERIAL DUPLEX STUDY Indications: Left leg cooler than right. 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  Current ABI: Not obtained secondary to significant swelling Comparison Study: No prior study Performing Technologist: Maudry Mayhew MHA, RDMS, RVT, RDCS  Examination Guidelines: A complete evaluation includes B-mode imaging, spectral Doppler, color Doppler, and power Doppler as needed of all accessible portions of each vessel. Bilateral testing is considered an integral part of a complete examination. Limited examinations for reoccurring indications may be performed as noted.  +-----------+--------+-----+--------+---------+--------+ LEFT       PSV cm/sRatioStenosisWaveform Comments +-----------+--------+-----+--------+---------+--------+ CFA Distal 55                   triphasic         +-----------+--------+-----+--------+---------+--------+ DFA        36                   triphasic         +-----------+--------+-----+--------+---------+--------+ SFA Prox   58                   triphasic         +-----------+--------+-----+--------+---------+--------+ SFA Mid    72                   triphasic         +-----------+--------+-----+--------+---------+--------+ SFA Distal 49                   triphasic         +-----------+--------+-----+--------+---------+--------+ POP Distal 32                   triphasic         +-----------+--------+-----+--------+---------+--------+ ATA Distal 24                   triphasic         +-----------+--------+-----+--------+---------+--------+ PTA Distal 40                   triphasic         +-----------+--------+-----+--------+---------+--------+ PERO Distal19                   triphasic         +-----------+--------+-----+--------+---------+--------+ DP         18                   triphasic         +-----------+--------+-----+--------+---------+--------+  Summary: Left: Near normal examination of left lower extremityi arteries with minimal intimal thickening or plaque.  See table(s)  above for measurements and observations. Electronically signed by Monica Martinez MD on 06/29/2019 at 8:39:51 PM.    Final    [place] EKG SITE RITE  Result Date: 06/19/2019 If Site Rite image not attached, placement could not be confirmed due to current cardiac rhythm.   EKG EKG Interpretation  Date/Time:  Sunday July 08 2019 13:52:49 EDT Ventricular Rate:  106 PR Interval:    QRS Duration: 110 QT Interval:  411 QTC Calculation: 546 R Axis:   102 Text Interpretation: Sinus tachycardia Rightward axis Atrial premature complex Prolonged QT interval Non-specific ST-t changes Confirmed by Lajean Saver [phone]) on 07/08/2019 1:59:38 PM   Radiology DG Chest Port 1 View  Result Date: 07/08/2019 CLINICAL DATA:  Fever and possible sepsis. EXAM: PORTABLE CHEST 1 VIEW COMPARISON:  06/24/2019 FINDINGS: Right-sided PICC line unchanged. Lungs are adequately inflated with hazy opacification over the perihilar regions and lung bases as well as right upper lung. Changes over the right upper lung are new/worse. Small to moderate  left pleural effusion likely with associated basilar atelectasis slightly worse. Cardiomediastinal silhouette and remainder the exam is unchanged. IMPRESSION: 1. Slight worsening bilateral multifocal airspace process likely due to infection. 2. Possible component of interstitial edema. Worsening moderate size left effusion likely with associated basilar atelectasis. Electronically Signed   By: Marin Olp M.D.   On: 07/08/2019 14:34    Procedures Procedures (including critical care time)  Medications Ordered in ED Medications  ceFEPIme (MAXIPIME) 2 g in sodium chloride 0.9 % 100 mL IVPB (has no administration in time range)  metroNIDAZOLE (FLAGYL) IVPB 500 mg (has no administration in time range)  vancomycin (VANCOCIN) IVPB 1000 mg/200 mL premix (has no administration in time range)    ED Course  I have reviewed the triage vital signs and the nursing notes.  Pertinent labs &  imaging results that were available during my care of the patient were reviewed by me and considered in my medical decision making (see chart for details).    MDM Rules/Calculators/A&P                         Blood pressure low. Iv NS bolus. Stat labs and cultures. Ecg. Iv abx.   MDM Number of Diagnoses or Management Options   Amount and/or Complexity of Data Reviewed Clinical lab tests: ordered and reviewed Tests in the radiology section of CPT: ordered and reviewed Tests in the medicine section of CPT: ordered and reviewed Discussion of test results with the performing providers: yes Decide to obtain previous medical records or to obtain history from someone other than the patient: yes Obtain history from someone other than the patient: yes Review and summarize past medical records: yes Discuss the patient with other providers: yes Independent visualization of images, tracings, or specimens: yes  Risk of Complications, Morbidity, and/or Mortality Presenting problems: high Diagnostic procedures: high Management options: high   Reviewed nursing notes and prior charts for additional history.   CXR reviewed/interpreted by me - bil opacities, ?pna.   Initial labs reviewed/interpreted by me - k mildly low, Mg added to labs. kcl iv. Initial lactate mildly elevated - ns bolus. bp mildly improved. From prior charts, it appears baseline bp is in 90s/60s range.   Recheck pt, no change or new symptoms.   Additional labs reviewed/interpreted by me - pt pancytopenic, hgb extremely low 4.6. no melena, stools are heme pos. Hx recent transfusion. 3 units prbc transfusion ordered. Neutropenic - have already initiated broad spectrum abx therapy.   PCCM consulted for admission.   Additional labs reviewed/interpreted by me - Mg is low. Mg 2 gm iv.   Patients hypotension felt multifactorial (I.e. not purely related to sepsis) - hgb 4, also w febrile neutropenia, and also w recent very poor po  intake/dehydration. Will give additional ivf and blood.    Additional labs reviewed/interpreted by me - delta trop remains mildly elev, but not increased from initial. Pt w no cp or discomfort. Repeat lactate is much improved from initial and now normal.  CRITICAL CARE RE: febrile neutropenia with hypotension, severe symptomatic anemia with hypotension, pancytopenia, fever/severe sepsis   Performed by: Mirna Mires Total critical care time: 185 minutes Critical care time was exclusive of separately billable procedures and treating other patients. Critical care was necessary to treat or prevent imminent or life-threatening deterioration. Critical care was time spent personally by me on the following activities: development of treatment plan with patient and/or surrogate as well as nursing, discussions  with consultants, evaluation of patient's response to treatment, examination of patient, obtaining history from patient or surrogate, ordering and performing treatments and interventions, ordering and review of laboratory studies, ordering and review of radiographic studies, pulse oximetry and re-evaluation of patient's condition.    Final Clinical Impression(s) / ED Diagnoses Final diagnoses:  None    Rx / DC Orders ED Discharge Orders    None       Lajean Saver, MD 07/08/19 1657

## 2019-07-08 NOTE — ED Triage Notes (Signed)
Pt presents from home with possible sepsis  Pt has been feeling unwell for the three weeks.  Pt a/o x 4 and presents +3 pitting edema. On arrival to ED, pt was 77% RA. Pt put on 15L non-rebreather and O2 came up to 100%.  Pt had a temp of 101 with EMS and pt was given 100mg  of tylenlol at 13:16.  Pt denies pain but reports weakness.

## 2019-07-08 NOTE — ED Notes (Signed)
ICU unable d/t a Code situation. Agreed to call for report when they can.

## 2019-07-08 NOTE — Sepsis Progress Note (Signed)
Received 2L IVF resuscitation and will received units of PRBC as well.

## 2019-07-08 NOTE — Progress Notes (Signed)
Pharmacy Antibiotic Note  Tony Saunders is a 64 y.o. male admitted on 07/08/2019 with sepsis, neutropenic fever. Pharmacy has been consulted for Vancomycin and Cefepime dosing. Patient also placed on Metronidazole per MD.  Plan:  Vancomycin 1750mg  IV x 1 given in the ED. Continue with Vancomycin 750mg  IV q8h.  Vancomycin trough level at steady state, as indicated.  Cefepime 2g IV q8h.  Metronidazole 500mg  IV q8h.  Monitor renal function, cultures, clinical course.   Height: 6' (182.9 cm) Weight: 77.1 kg (170 lb) IBW/kg (Calculated) : 77.6  Temp (24hrs), Avg:99.1 F (37.3 C), Min:97.8 F (36.6 C), Max:102 F (38.9 C)  Recent Labs  Lab 07/05/19 1151 07/08/19 1415 07/08/19 1416 07/08/19 1552  WBC 6.9  --  0.1*  --   CREATININE 0.83  --  0.90  --   LATICACIDVEN  --  2.7*  --  0.7    Estimated Creatinine Clearance: 92.8 mL/min (by C-G formula based on SCr of 0.9 mg/dL).    No Known Allergies  Antimicrobials this admission: 6/20 Vancomycin >> 6/20 Cefepime >> 6/20 Metronidazole >>  Dose adjustments this admission: --  Microbiology results: 6/20 BCx: sent 6/20 UCx: ordered    Thank you for allowing pharmacy to be a part of this patient's care.   Lindell Spar, PharmD, BCPS Clinical Pharmacist  07/08/2019 6:50 PM

## 2019-07-08 NOTE — ED Notes (Signed)
Blood bank called and has blood ready. Notified Zach,RN.

## 2019-07-08 NOTE — Progress Notes (Signed)
Notified provider of need to order additional fluid bolus to meet fluid goal of 2313 ml based on pt actual weight.

## 2019-07-08 NOTE — ED Notes (Signed)
Pt does not have any signs of a blood transfusion reaction at this time.

## 2019-07-09 ENCOUNTER — Encounter (HOSPITAL_COMMUNITY): Payer: Self-pay | Admitting: Internal Medicine

## 2019-07-09 DIAGNOSIS — I5043 Acute on chronic combined systolic (congestive) and diastolic (congestive) heart failure: Secondary | ICD-10-CM

## 2019-07-09 DIAGNOSIS — I4819 Other persistent atrial fibrillation: Secondary | ICD-10-CM

## 2019-07-09 DIAGNOSIS — D696 Thrombocytopenia, unspecified: Secondary | ICD-10-CM

## 2019-07-09 DIAGNOSIS — I959 Hypotension, unspecified: Secondary | ICD-10-CM

## 2019-07-09 DIAGNOSIS — I5023 Acute on chronic systolic (congestive) heart failure: Secondary | ICD-10-CM

## 2019-07-09 DIAGNOSIS — R570 Cardiogenic shock: Secondary | ICD-10-CM

## 2019-07-09 DIAGNOSIS — C8330 Diffuse large B-cell lymphoma, unspecified site: Secondary | ICD-10-CM

## 2019-07-09 DIAGNOSIS — D649 Anemia, unspecified: Secondary | ICD-10-CM

## 2019-07-09 LAB — BASIC METABOLIC PANEL
Anion gap: 9 (ref 5–15)
BUN: 18 mg/dL (ref 8–23)
CO2: 23 mmol/L (ref 22–32)
Calcium: 7.2 mg/dL — ABNORMAL LOW (ref 8.9–10.3)
Chloride: 104 mmol/L (ref 98–111)
Creatinine, Ser: 0.81 mg/dL (ref 0.61–1.24)
GFR calc Af Amer: >60 mL/min (ref >60)
GFR calc non Af Amer: >60 mL/min (ref >60)
Glucose, Bld: 134 mg/dL — ABNORMAL HIGH (ref 70–99)
Potassium: 3.2 mmol/L — ABNORMAL LOW (ref 3.5–5.1)
Sodium: 136 mmol/L (ref 135–145)

## 2019-07-09 LAB — CBC
HCT: 27.3 % — ABNORMAL LOW (ref 39.0–52.0)
Hemoglobin: 8.9 g/dL — ABNORMAL LOW (ref 13.0–17.0)
MCH: 28.5 pg (ref 26.0–34.0)
MCHC: 32.6 g/dL (ref 30.0–36.0)
MCV: 87.5 fL (ref 80.0–100.0)
Platelets: 29 10*3/uL — CL (ref 150–400)
RBC: 3.12 MIL/uL — ABNORMAL LOW (ref 4.22–5.81)
RDW: 16.9 % — ABNORMAL HIGH (ref 11.5–15.5)
WBC: 1 10*3/uL — CL (ref 4.0–10.5)
nRBC: 0 % (ref 0.0–0.2)

## 2019-07-09 LAB — COOXEMETRY PANEL
Carboxyhemoglobin: 2.2 % — ABNORMAL HIGH (ref 0.5–1.5)
Methemoglobin: 0.6 % (ref 0.0–1.5)
O2 Saturation: 48.8 %
Total hemoglobin: 9 g/dL — ABNORMAL LOW (ref 12.0–16.0)

## 2019-07-09 MED ORDER — CHLORHEXIDINE GLUCONATE CLOTH 2 % EX PADS
6.0000 | MEDICATED_PAD | Freq: Every day | CUTANEOUS | Status: DC
  Administered 2019-07-09 – 2019-07-25 (×17): 6 via TOPICAL

## 2019-07-09 MED ORDER — MIDODRINE HCL 5 MG PO TABS
5.0000 mg | ORAL_TABLET | Freq: Three times a day (TID) | ORAL | Status: DC
  Administered 2019-07-09 – 2019-07-12 (×11): 5 mg via ORAL
  Filled 2019-07-09 (×12): qty 1

## 2019-07-09 MED ORDER — FUROSEMIDE 10 MG/ML IJ SOLN
40.0000 mg | Freq: Once | INTRAMUSCULAR | Status: AC
  Administered 2019-07-09: 40 mg via INTRAVENOUS

## 2019-07-09 MED ORDER — FUROSEMIDE 10 MG/ML IJ SOLN
INTRAMUSCULAR | Status: AC
  Filled 2019-07-09: qty 4

## 2019-07-09 MED ORDER — SODIUM CHLORIDE 0.9% FLUSH
10.0000 mL | Freq: Two times a day (BID) | INTRAVENOUS | Status: DC
  Administered 2019-07-09 – 2019-07-10 (×2): 10 mL
  Administered 2019-07-10: 40 mL
  Administered 2019-07-11 – 2019-07-21 (×19): 10 mL
  Administered 2019-07-21: 20 mL
  Administered 2019-07-22 – 2019-07-24 (×4): 10 mL

## 2019-07-09 MED ORDER — ALTEPLASE 2 MG IJ SOLR
2.0000 mg | Freq: Once | INTRAMUSCULAR | Status: AC
  Administered 2019-07-09: 2 mg
  Filled 2019-07-09: qty 2

## 2019-07-09 MED ORDER — POTASSIUM CHLORIDE 10 MEQ/50ML IV SOLN
10.0000 meq | INTRAVENOUS | Status: AC
  Administered 2019-07-09 (×4): 10 meq via INTRAVENOUS
  Filled 2019-07-09 (×5): qty 50

## 2019-07-09 MED ORDER — SODIUM CHLORIDE 0.9 % IV SOLN
2.0000 g | Freq: Three times a day (TID) | INTRAVENOUS | Status: DC
  Administered 2019-07-09 – 2019-07-12 (×10): 2 g via INTRAVENOUS
  Filled 2019-07-09 (×9): qty 2

## 2019-07-09 MED ORDER — FUROSEMIDE 10 MG/ML IJ SOLN: 40.0000 mg | Freq: Once | INTRAMUSCULAR | Status: DC

## 2019-07-09 MED ORDER — LEVALBUTEROL HCL 1.25 MG/0.5ML IN NEBU
INHALATION_SOLUTION | RESPIRATORY_TRACT | Status: AC
  Administered 2019-07-09: 1.25 mg
  Filled 2019-07-09: qty 0.5

## 2019-07-09 MED ORDER — STERILE WATER FOR INJECTION IJ SOLN
INTRAMUSCULAR | Status: AC
  Filled 2019-07-09: qty 10

## 2019-07-09 MED ORDER — LEVALBUTEROL HCL 1.25 MG/0.5ML IN NEBU: 1.2500 mg | INHALATION_SOLUTION | Freq: Four times a day (QID) | RESPIRATORY_TRACT | Status: DC | PRN

## 2019-07-09 MED ORDER — FUROSEMIDE 10 MG/ML IJ SOLN
40.0000 mg | Freq: Two times a day (BID) | INTRAMUSCULAR | Status: DC
  Administered 2019-07-09 – 2019-07-11 (×5): 40 mg via INTRAVENOUS
  Filled 2019-07-09 (×4): qty 4

## 2019-07-09 MED ORDER — MILRINONE LACTATE IN DEXTROSE 20-5 MG/100ML-% IV SOLN
0.1250 ug/kg/min | INTRAVENOUS | Status: DC
  Administered 2019-07-09 – 2019-07-17 (×11): 0.25 ug/kg/min via INTRAVENOUS
  Filled 2019-07-09 (×12): qty 100

## 2019-07-09 MED ORDER — LAMOTRIGINE 100 MG PO TABS
100.0000 mg | ORAL_TABLET | Freq: Two times a day (BID) | ORAL | Status: DC
  Administered 2019-07-09 – 2019-07-25 (×33): 100 mg via ORAL
  Filled 2019-07-09 (×34): qty 1

## 2019-07-09 MED ORDER — IPRATROPIUM BROMIDE 0.02 % IN SOLN
0.5000 mg | Freq: Four times a day (QID) | RESPIRATORY_TRACT | Status: DC | PRN
  Administered 2019-07-09: 0.5 mg via RESPIRATORY_TRACT

## 2019-07-09 MED ORDER — PRO-STAT SUGAR FREE PO LIQD
30.0000 mL | Freq: Three times a day (TID) | ORAL | Status: DC
  Administered 2019-07-09 – 2019-07-24 (×33): 30 mL via ORAL
  Filled 2019-07-09 (×37): qty 30

## 2019-07-09 MED ORDER — ADULT MULTIVITAMIN W/MINERALS CH
1.0000 | ORAL_TABLET | Freq: Every day | ORAL | Status: DC
  Administered 2019-07-09 – 2019-07-25 (×17): 1 via ORAL
  Filled 2019-07-09 (×17): qty 1

## 2019-07-09 MED ORDER — SODIUM CHLORIDE 0.9% FLUSH
10.0000 mL | INTRAVENOUS | Status: DC | PRN
  Administered 2019-07-25 (×2): 10 mL

## 2019-07-09 MED ORDER — LEVETIRACETAM 500 MG PO TABS: 750.0000 mg | ORAL_TABLET | Freq: Two times a day (BID) | ORAL | Status: DC

## 2019-07-09 MED ORDER — LEVETIRACETAM 500 MG PO TABS
750.0000 mg | ORAL_TABLET | Freq: Two times a day (BID) | ORAL | Status: DC
  Administered 2019-07-09 – 2019-07-25 (×33): 750 mg via ORAL
  Filled 2019-07-09 (×36): qty 1

## 2019-07-09 MED ORDER — AMIODARONE HCL 200 MG PO TABS
200.0000 mg | ORAL_TABLET | Freq: Every day | ORAL | Status: DC
  Administered 2019-07-09 – 2019-07-25 (×17): 200 mg via ORAL
  Filled 2019-07-09 (×17): qty 1

## 2019-07-09 MED ORDER — BOOST / RESOURCE BREEZE PO LIQD CUSTOM
1.0000 | Freq: Two times a day (BID) | ORAL | Status: DC
  Administered 2019-07-10 – 2019-07-20 (×14): 1 via ORAL

## 2019-07-09 NOTE — TOC Initial Note (Signed)
Transition of Care [facility]) - Initial/Assessment Note    Patient Details  Name: Tony Saunders MRN: [id number] Date of Birth: Sep 19, 1955  Transition of [facility]) CM/SW Contact:    Leeroy Cha, RN Phone Number: 07/09/2019, 10:10 AM  Clinical Narrative:                 Admitted with circulatory shock.  HGB 4.6 and WBC 0.01, has a sacral pressure wound on admit. Iv solu-cortef,iv abx, mgso4 x1,iv vancoready, Bld cultures x2 x2d =neg Plan follow for needs to go home. Expected Discharge Plan: Home/Self Care Barriers to Discharge: Continued Medical Work up   Patient Goals and CMS Choice Patient states their goals for this hospitalization and ongoing recovery are:: to go home CMS Medicare.gov Compare Post Acute Care list provided to:: Patient Choice offered to / list presented to : Patient  Expected Discharge Plan and Services Expected Discharge Plan: Home/Self Care   Discharge Planning Services: CM Consult   Living arrangements for the past 2 months: Single Family Home                                      Prior Living Arrangements/Services Living arrangements for the past 2 months: Single Family Home Lives with:: Spouse Patient language and need for interpreter reviewed:: No Do you feel safe going back to the place where you live?: Yes      Need for Family Participation in Patient Care: Yes (Comment) Care giver support system in place?: Yes (comment)   Criminal Activity/Legal Involvement Pertinent to Current Situation/Hospitalization: No - Comment as needed  Activities of Daily Living Home Assistive Devices/Equipment: Environmental consultant (specify type), Wheelchair, Dentures (specify type) ADL Screening (condition at time of admission) Patient's cognitive ability adequate to safely complete daily activities?: Yes Is the patient deaf or have difficulty hearing?: No Does the patient have difficulty seeing, even when wearing glasses/contacts?: No Does the patient have  difficulty concentrating, remembering, or making decisions?: No Patient able to express need for assistance with ADLs?: Yes Does the patient have difficulty dressing or bathing?: Yes Independently performs ADLs?: No Communication: Independent Dressing (OT): Needs assistance Is this a change from baseline?: Pre-admission baseline Grooming: Needs assistance Is this a change from baseline?: Pre-admission baseline Feeding: Independent Bathing: Needs assistance Is this a change from baseline?: Pre-admission baseline Toileting: Needs assistance Is this a change from baseline?: Pre-admission baseline In/Out Bed: Needs assistance Is this a change from baseline?: Pre-admission baseline Walks in Home: Needs assistance Is this a change from baseline?: Pre-admission baseline Does the patient have difficulty walking or climbing stairs?: Yes Weakness of Legs: Both Weakness of Arms/Hands: Both  Permission Sought/Granted                  Emotional Assessment Appearance:: Appears stated age     Orientation: : Oriented to Self, Oriented to Place, Oriented to  Time, Oriented to Situation Alcohol / Substance Use: Not Applicable Psych Involvement: No (comment)  Admission diagnosis:  Other specified hypotension [I95.89] Hypokalemia [E87.6] Pulmonary infiltrate [R91.8] Thrombocytopenia (HCC) [D69.6] Febrile neutropenia (HCC) [D70.9, R50.81] Pancytopenia (HCC) [D61.818] Severe sepsis ([place]) [A41.9, R65.20] Neutropenic sepsis ([place]) [A41.9, D70.9] Symptomatic anemia [D64.9] Lymphoma, unspecified body region, unspecified lymphoma type ([place]) [C85.90] Patient Active Problem List   Diagnosis Date Noted  . Shock circulatory ([place]) 07/08/2019  . Acute hypoxemic respiratory failure ([place]) 07/08/2019  . Severe sepsis ([place]) 07/08/2019  . Neutropenic  sepsis ([place]) 07/08/2019  . Febrile neutropenia ([place])   . Lymphoma ([place])   . Pulmonary infiltrate   . Hypotension   . Protein-calorie malnutrition,  severe 06/21/2019  . Atrial flutter with rapid ventricular response ([place])   . Pressure injury of skin 06/16/2019  . Oropharyngeal candidiasis 06/16/2019  . Hypokalemia 06/16/2019  . Ileus ([place]) 06/16/2019  . Pancytopenia ([place]) 06/15/2019  . Acute on chronic combined systolic and diastolic CHF (congestive heart failure) ([place]) 06/13/2019  . 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 Atrial fibrillation with RVR ([place]) 05/23/2019  . Left leg swelling   . PAF (persistent-paroxysmal atrial fibrillation) ([place]) 04/16/2019  . Ischemic cardiomyopathy 04/16/2019  . Severe mitral regurgitation by prior echocardiogram-likely ischemic with tethered posterior leaflet 03/31/2019  . Elevated transaminase level 03/31/2019  . 3vessel CAD- S/P PCI 03/27/2019  . Acute ST elevation myocardial infarction (STEMI) of inferior wall ([place]) 03/27/2019  . Hyperlipidemia with target LDL less than 70 03/27/2019  . 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

## 2019-07-09 NOTE — Progress Notes (Signed)
eLink Physician-Brief Progress Note Patient Name: Marcellius Montagna DOB: 1955-08-06 MRN: [id number]   Date of Service  07/09/2019  HPI/Events of Note  Nursing reports patient in AFIB - Ventricular rate = 78. Patient is already on Amiodarone PO likely for AFIB/AFlutter.   eICU Interventions  Continue present management for chronic AFIB with controlled ventricular response.      Intervention Category Major Interventions: Arrhythmia - evaluation and management  Daksha Koone [place] 07/09/2019, 11:28 PM

## 2019-07-09 NOTE — Consult Note (Addendum)
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 upper lung. Changes over the right upper lung are new/worse. Small to moderate left pleural effusion likely with associated basilar atelectasis slightly worse. Cardiomediastinal silhouette and remainder the exam is unchanged. IMPRESSION: 1. Slight worsening bilateral multifocal airspace process likely due to infection. 2. Possible component of interstitial edema. Worsening moderate size left effusion likely with associated basilar atelectasis. Electronically Signed   By: Marin Olp M.D.   On: 07/08/2019 14:34        NO CHEST PAIN   Assessment and Plan:   1. Shock due to decrease of volume due to acute anemia with Hgb 4.6 +heme stools --transfused 3 units PRBCs and hgb to 8.9 -- xarelto and brilinta were stopped.  BP 65 systolic on admit now improved.  2. Volume overload - and hypotension midodrine has been resumed and one dose of IV lasix 40 mg given.  No urine outpt noted- with improved BP may be able to diuresis.  He has been on milrinone in past,  Will defer to Dr. Oval Linsey for limited echo to re-eval  EF 3. Cardiomyopathy -Ischemic with chronic systolic HF hx of milrinone for support in April  BB and losartan had been stopped as outpt due to hypotension, dig had been added currently on hold.  4. CAD with acute MI 03/2019 with m-d RCA PCI and stent (x 2) followed by staged PCI DES to CFX (x3) and LAD (x2) on 03/29/2019, has been on Brilinta and xarelto ASA was stopped in April.  If able to use antiplatelet for stents would use plavix.  Daughter with pt and aware. 5. aftrial flutter /fib appeared sR on admit may have been flutter but still in SR.  xarelto stopped.  Would need leave in a fib if he converts back due to need to hold anticoagulation 6.  severe MR -medical management - in future could be mitraClip depending on course.  7. 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 There is also involvement of the spleen and near complete infiltration of the right kidney. 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 Suspicious for infiltrating disease of the right iliac and left pubic bones.  Completed 2 cyscles of chemo, utilizing SHOP with rituximab, last completed 06/29/19 he rec'd growth factor support utilizing last on the 14th  Followed by Oncology.   Per oncology "He still has a potentially treatable condition and still requires aggressive measures which she is desiring at this time.  I agree with continuing aggressive measures  at this time."       For questions or updates, please contact [place] Please consult [email or web address] for contact info under    Signed, Cecilie Kicks, NP  07/09/2019 1:50 PM

## 2019-07-09 NOTE — Progress Notes (Signed)
Elink informed of pt hypotension. Pt alert/oriented x4. No new orders at this time.

## 2019-07-09 NOTE — Progress Notes (Addendum)
NAME:  Tony Saunders, MRN:  [id number], DOB:  10/01/1955, LOS: 1 ADMISSION DATE:  07/08/2019, CONSULTATION DATE:  6/20 REFERRING MD:  Dr. Ashok Cordia, CHIEF COMPLAINT:  Weakness    Brief History   64 y/o M admitted 6/20 with reports of a 3 week history of diarrhea and progressive weakness.  Work up notable for pallor, febrile neutropenia, severe anemia (hgb 4.6) and heme positive stools (on xarelto).  The patient was recently admitted from 5/28-6/12 for hypotension, new AFwRVR s/p DCCV x2 (on amiodarone, xarelto, brilinta), pancytopenia requiring multiple transfusions.   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 rate (!) 31, height 6' (1.829 m), weight 77.1 kg, SpO2 91 %.        Intake/Output Summary (Last 24 hours) at 07/09/2019 8502 Last data filed at 07/09/2019 0539 Gross per 24 hour  Intake 4245.5 ml  Output --  Net 4245.5 ml   Filed Weights   07/08/19 1403  Weight: 77.1 kg    Examination: General: chronically ill appearing adult male sitting up in bed in NAD HEENT: MM pale/moist, anicteric  Neuro: AAOx4, speech clear, MAE CV: s1s2 irr irr, AF in 80's  on monitor, regular, no m/r/g PULM:  Non-labored, lungs bilaterally with crackles, rhonchi  GI: soft, bsx4 active  Extremities: warm/dry, 3+ BLE pitting edema  Skin: thin skin, scattered ecchymosis, sacral decubitus (per patient, present on admit)  Resolved Hospital Problem list      Assessment & Plan:   Neutropenic Fever  Pancytopenia  Rule out opportunistic infection, no clear source sepsis  -appreciate HEME/ONC input  -neutropenic precautions  -follow CBC  Shock   Suspect element of hypovolemic with profound anemia, +/- septic  -continue stress dose steroids  -abx as above  -follow cultures  -may need pressors to maintain  MAP -continue stress dose steroids  -resume home midodrine   Severe Anemia  Rule out GI bleed  -volume resuscitation with PRBC  -lasix 40 mg IV x1 now  -continue PPI -may need GI evaluation  -hold anticoagulation   Interstitial Edema  Acute Hypoxemic Respiratory Failure  Former Smoker -lasix as above -follow CXR  -confirmed with patient he would accept intubation, see note below  -wean O2 for sats 90-95%  Hx AF, CAD, HLD, Chronic Combined Systolic / Diastolic CHF  On midodrine at baseline -hold home amiodarone -resume midodrine  -brilinta, xarelto, lanoxin on hold  -Cardiology consult  -assess co-ox   Hypokalemia  Hypomagnesemia  -monitor, replace as indicated  -follow up BMP   Mild Troponin Leak  Suspect in setting of profound anemia,  demand. EKG without ischemic changes.  -follow troponin  -tele monitoring   Diffuse Large Cell Lymphoma  Extensive disease involvement, s/p 2 cycles of chemotherapy with CHOP/rituximab, last on 6/11.  Thought per ONC to be a potentially treatable condition.   -per ONC -follow blood counts closely  Pressure Ulcer  Sacral pressure ulcer, present on admit -wound care per protocol  -pressure relieving measures   Hx Seizure  -continue lamictal, keppra   Best practice:  Diet: Clear  liquid Pain/Anxiety/Delirium protocol (if indicated): n/a  VAP protocol (if indicated): n/a  DVT prophylaxis: SCD's  GI prophylaxis: PPI Glucose control: n/a  Mobility: as tolerated  Code Status: Full Code, pt would want intubation, CPR if necessary.  Would not want long term life support if no reasonable chance of recovery.  Family Communication: Patient updated on plan of care 6/21  Disposition: ICU   Labs   CBC: Recent Labs  Lab 07/05/19 1151 07/08/19 1416  WBC 6.9 0.1*  NEUTROABS  --  0.1*  HGB 7.5* 4.6*  HCT 24.7* 14.6*  MCV 89.2 85.4  PLT 123* 28*    Basic Metabolic Panel: Recent Labs  Lab 07/05/19 1151 07/08/19 1416 07/08/19 1515  NA 138 137  --   K 4.3 3.1*  --   CL 105 104  --   CO2 27 25  --   GLUCOSE 87 93  --   BUN 17 15  --   CREATININE 0.83 0.90  --   CALCIUM 8.3* 7.6*  --   MG  --   --  1.4*   GFR: Estimated Creatinine Clearance: 92.8 mL/min (by C-G formula based on SCr of 0.9 mg/dL). Recent Labs  Lab 07/05/19 1151 07/08/19 1415 07/08/19 1416 07/08/19 1552 07/08/19 1709  PROCALCITON  --   --   --   --  1.15  WBC 6.9  --  0.1*  --   --   LATICACIDVEN  --  2.7*  --  0.7  --     Liver Function Tests: Recent Labs  Lab 07/05/19 1151 07/08/19 1416  AST 20 23  ALT 28 23  ALKPHOS 67 44  BILITOT 0.7 0.8  PROT 4.3* 4.1*  ALBUMIN 2.3* 2.0*   No results for input(s): LIPASE, AMYLASE in the last 168 hours. No results for input(s): AMMONIA in the last 168 hours.  ABG    Component Value Date/Time   HCO3 22.0 03/29/2019 1012   TCO2 23 03/29/2019 1012   ACIDBASEDEF 2.0 03/29/2019 1012   O2SAT 88.2 06/27/2019 1128     Coagulation Profile: Recent Labs  Lab 07/08/19 1416  INR 1.8*    Cardiac Enzymes: No results for input(s): CKTOTAL, CKMB, CKMBINDEX, TROPONINI in the last 168 hours.  HbA1C: Hgb A1c MFr Bld  Date/Time Value Ref Range Status  03/27/2019 07:06 AM 5.3 4.8 - 5.6 % Final    Comment:    (NOTE) Pre diabetes:           5.7%-6.4% Diabetes:              >6.4% Glycemic control for   <7.0% adults with diabetes     CBG: No results for input(s): GLUCAP in the last 168 hours.   Critical care time: 76 minutes       Noe Gens, MSN, NP-C [facility] 07/09/2019, 8:11 AM   Please see [email or web address] for pager details.

## 2019-07-09 NOTE — Progress Notes (Signed)
Initial Nutrition Assessment  DOCUMENTATION CODES:    (unable to assess for malnutrition at this time.)  INTERVENTION:  - will order Boost Breeze BID, each supplement provides 250 kcal and 9 grams of protein. - will order 30 mL Prostat TID, each supplement provides 100 kcal and 15 grams of protein. - will order 1 tablet multivitamin with minerals.  NUTRITION DIAGNOSIS:   Inadequate protein intake related to other (see comment) (current diet order) as evidenced by other (comment) (CLD does not meet estimated protein need.).  GOAL:   Patient will meet greater than or equal to 90% of their needs  MONITOR:   PO intake, Supplement acceptance, Diet advancement, Labs, Weight trends, Skin  REASON FOR ASSESSMENT:   Malnutrition Screening Tool  ASSESSMENT:   64 year-old male with medical hx of CHF, large B-cell lymphoma, CAD s/p PCI, MI, afib, seizures. He quit smoking 17 months ago. He presented to the ED on 6/20 with 3 week hx of diarrhea, progressive weakness, and profound leg weakness and pallor. He was found to have febrile neutropenia and severe anemia.  Unable to see patient x2 attempted visits d/t other medical needs, staff in assisting. Did visualize a male visitor in patient's room during attempted visits; she was engaged with staff in the room.   Patient was most recently seen by this RD on 6/3. At that time, patient had a poor appetite/lack of desire, but staff and daughter, who was at bedside at that time, were providing ongoing encouragement.   At that time, patient met criteria for severe malnutrition in the context of chronic illness, cancer and cancer treatments as evidenced by moderate fat depletions, moderate and severe muscle depletions, and edema. Suspect this is ongoing.   Per chart review, weight yesterday was 170 lb. Weight has been fluctuating over the past 1 month. Flow sheet documentation indicates very deep pitting edema to BLE.   Labs reviewed; K: 3.2  mmol/l, Ca: 7.2 mg/dl. Medications reviewed; 40 mg IV lasix x1 dose 6/21, 100 mg IV solu-cortef TID, 2 g IV Mg sulfate x1 run 6/20, 10 mEq IV KCl x4 runs 6/21.    NUTRITION - FOCUSED PHYSICAL EXAM:  unable to complete at this time.   Diet Order:   Diet Order            Diet clear liquid Room service appropriate? Yes; Fluid consistency: Thin  Diet effective now                 EDUCATION NEEDS:   Not appropriate for education at this time  Skin:  Skin Assessment: Skin Integrity Issues: Skin Integrity Issues:: Stage I, Stage II Stage I: sacrum Stage II: L buttocks  Last BM:  6/21  Height:   Ht Readings from Last 1 Encounters:  07/08/19 6' (1.829 m)    Weight:   Wt Readings from Last 1 Encounters:  07/08/19 77.1 kg     Estimated Nutritional Needs:  Kcal:  2200-2500 kcal Protein:  120-135 grams Fluid:  >/= 2.4 L/day      , MS, RD, LDN, CNSC Inpatient Clinical Dietitian RD pager # available in AMION  After hours/weekend pager # available in AMION  

## 2019-07-09 NOTE — Progress Notes (Signed)
Pt's sats down to 84% on 4liters [place], increased to 6 liter, expiratory wheezing, resp rate 29. O2 changed to NRM,RT notified Breathing tx given .  Brandie Olis ACNP  notified Lasix 40mg  given , placed on Bipap

## 2019-07-09 NOTE — Progress Notes (Signed)
IP PROGRESS NOTE  Subjective:   Events noted.  [name] was hospitalized on 07/08/2019 after presenting with weakness and failure to thrive and was found to have severe pancytopenia.  He was then noted to be hypotensive although no clear evidence of sepsis.  He is receiving packed red cell transfusion and supportive care.  He did not require intubation or pressors.  Clinically, he reports feeling reasonably fair with adequate mentation.  He does report of being thirsty this morning no pain or discomfort.  He does report lower extremity edema.  Objective:  Vital signs in last 24 hours: Temp:  [97.7 F (36.5 C)-102 F (38.9 C)] 97.7 F (36.5 C) (06/21 0554) Pulse Rate:  [33-116] 33 (06/21 0630) Resp:  [24-38] 31 (06/21 0630) BP: (69-144)/(42-68) 81/51 (06/21 0630) SpO2:  [78 %-100 %] 91 % (06/21 0630) Weight:  [170 lb (77.1 kg)] 170 lb (77.1 kg) (06/20 1403) Weight change:  Last BM Date: 07/08/19  Intake/Output from previous day: 06/20 0701 - 06/21 0700 In: 4245.5 [Blood:1598; IV Piggyback:2647.5] Out: -  General: Alert, awake without distress. Head: Normocephalic atraumatic. Mouth: mucous membranes moist, pharynx normal without lesions Eyes: No scleral icterus.  Pupils are equal and round reactive to light. Resp: clear to auscultation bilaterally without rhonchi or wheezes or dullness to percussion. Cardio: regular rate and rhythm, S1, S2 normal.  Bilateral lower extremity edema GI: soft, non-tender; bowel sounds normal; no masses,  no organomegaly Musculoskeletal: No joint deformity or effusion. Neurological: No motor, sensory deficits.  Intact deep tendon reflexes. Skin: No rashes or lesions.    Lab Results: Recent Labs    07/08/19 1416  WBC 0.1*  HGB 4.6*  HCT 14.6*  PLT 28*    BMET Recent Labs    07/08/19 1416  NA 137  K 3.1*  CL 104  CO2 25  GLUCOSE 93  BUN 15  CREATININE 0.90  CALCIUM 7.6*    Studies/Results: DG Chest Port 1 View  Result Date:  07/08/2019 CLINICAL DATA:  Fever and possible sepsis. EXAM: PORTABLE CHEST 1 VIEW COMPARISON:  06/24/2019 FINDINGS: Right-sided PICC line unchanged. Lungs are adequately inflated with hazy opacification over the perihilar regions and lung bases as well as right upper lung. Changes over the right upper lung are new/worse. Small to moderate left pleural effusion likely with associated basilar atelectasis slightly worse. Cardiomediastinal silhouette and remainder the exam is unchanged. IMPRESSION: 1. Slight worsening bilateral multifocal airspace process likely due to infection. 2. Possible component of interstitial edema. Worsening moderate size left effusion likely with associated basilar atelectasis. Electronically Signed   By: Marin Olp M.D.   On: 07/08/2019 14:34    Medications: I have reviewed the patient's current medications.  Assessment/Plan:  64 year old with:  1.  Diffuse large cell lymphoma with extensive disease involvement.  He completed 2 cycles of chemotherapy utilizing CHOP with rituximab.  Last cycle of chemotherapy completed on June 11.  He received growth factor support utilizing last on the 14th.  He has developed a severe pancytopenia.  His next cycle of chemotherapy is not due till 2 more weeks and will make adjustments with the dosing of chemotherapy to avoid severe cytopenias.  He has clinically benefited from chemotherapy with reduction in his abdominal adenopathy.  Chemotherapy on hold for the time being till he is recovered.   2.  Neutropenia: He has received growth factor support and additional Neupogen will not be beneficial at this time.  I anticipate recovery of his white cell count in  the next few days.  3.  Anemia/thrombocytopenia: I agree with the transfusion plan at this point his hemoglobin close to 8 as possible.  No active bleeding noted and does not require any platelet transfusion.  4.  Hypotension: There is no clear element of sepsis at this time.  Could  be cardiac in etiology versus volume depletion.  Management per critical care management team.  5.  Prognosis and disposition: He still has a potentially treatable condition and still requires aggressive measures which she is desiring at this time.  I agree with continuing aggressive measures at this time.    We will continue to follow with you.   25  minutes were dedicated to this visit.  More than 50% of of the time was face-to-face and the time was spent on reviewing laboratory data, imaging studies, discussing treatment options, and answering questions regarding future plan.   LOS: 1 day   Zola Button 07/09/2019, 7:15 AM

## 2019-07-10 DIAGNOSIS — I4819 Other persistent atrial fibrillation: Secondary | ICD-10-CM

## 2019-07-10 LAB — URINE CULTURE: Culture: NO GROWTH

## 2019-07-10 LAB — BPAM RBC
Blood Product Expiration Date: 202107222359
Blood Product Expiration Date: 202107222359
Blood Product Expiration Date: 202107222359
ISSUE DATE / TIME: 202106201718
ISSUE DATE / TIME: 202106210143
ISSUE DATE / TIME: 202106210531
Unit Type and Rh: 5100
Unit Type and Rh: 5100
Unit Type and Rh: 5100

## 2019-07-10 LAB — HEPARIN LEVEL (UNFRACTIONATED)
Heparin Unfractionated: <0.1 IU/mL — ABNORMAL LOW (ref 0.30–0.70)
Heparin Unfractionated: <0.1 IU/mL — ABNORMAL LOW (ref 0.30–0.70)

## 2019-07-10 LAB — COMPREHENSIVE METABOLIC PANEL
ALT: 24 U/L (ref 0–44)
AST: 17 U/L (ref 15–41)
Albumin: 1.9 g/dL — ABNORMAL LOW (ref 3.5–5.0)
Alkaline Phosphatase: 50 U/L (ref 38–126)
Anion gap: 11 (ref 5–15)
BUN: 20 mg/dL (ref 8–23)
CO2: 25 mmol/L (ref 22–32)
Calcium: 7 mg/dL — ABNORMAL LOW (ref 8.9–10.3)
Chloride: 101 mmol/L (ref 98–111)
Creatinine, Ser: 0.91 mg/dL (ref 0.61–1.24)
GFR calc Af Amer: >60 mL/min (ref >60)
GFR calc non Af Amer: >60 mL/min (ref >60)
Glucose, Bld: 108 mg/dL — ABNORMAL HIGH (ref 70–99)
Potassium: 2.8 mmol/L — ABNORMAL LOW (ref 3.5–5.1)
Sodium: 137 mmol/L (ref 135–145)
Total Bilirubin: 1 mg/dL (ref 0.3–1.2)
Total Protein: 4.2 g/dL — ABNORMAL LOW (ref 6.5–8.1)

## 2019-07-10 LAB — TYPE AND SCREEN
ABO/RH(D): O POS
Antibody Screen: NEGATIVE
Unit division: 0
Unit division: 0
Unit division: 0

## 2019-07-10 LAB — PROTIME-INR
INR: 1.2 (ref 0.8–1.2)
Prothrombin Time: 14.9 seconds (ref 11.4–15.2)

## 2019-07-10 LAB — CBC
HCT: 26.3 % — ABNORMAL LOW (ref 39.0–52.0)
Hemoglobin: 8.6 g/dL — ABNORMAL LOW (ref 13.0–17.0)
MCH: 28 pg (ref 26.0–34.0)
MCHC: 32.7 g/dL (ref 30.0–36.0)
MCV: 85.7 fL (ref 80.0–100.0)
Platelets: 28 10*3/uL — CL (ref 150–400)
RBC: 3.07 MIL/uL — ABNORMAL LOW (ref 4.22–5.81)
RDW: 17.3 % — ABNORMAL HIGH (ref 11.5–15.5)
WBC: 2.3 10*3/uL — ABNORMAL LOW (ref 4.0–10.5)
nRBC: 0 % (ref 0.0–0.2)

## 2019-07-10 LAB — APTT: aPTT: 36 seconds (ref 24–36)

## 2019-07-10 LAB — COOXEMETRY PANEL
Carboxyhemoglobin: 1.7 % — ABNORMAL HIGH (ref 0.5–1.5)
Methemoglobin: 0.7 % (ref 0.0–1.5)
O2 Saturation: 55.1 %
Total hemoglobin: 8.8 g/dL — ABNORMAL LOW (ref 12.0–16.0)

## 2019-07-10 MED ORDER — HEPARIN (PORCINE) 25000 UT/250ML-% IV SOLN
1150.0000 [IU]/h | INTRAVENOUS | Status: DC
  Administered 2019-07-10: 900 [IU]/h via INTRAVENOUS
  Filled 2019-07-10: qty 250

## 2019-07-10 MED ORDER — DIGOXIN 125 MCG PO TABS
0.1250 mg | ORAL_TABLET | Freq: Every day | ORAL | Status: DC
  Administered 2019-07-10 – 2019-07-25 (×16): 0.125 mg via ORAL
  Filled 2019-07-10 (×16): qty 1

## 2019-07-10 MED ORDER — SERTRALINE HCL 25 MG PO TABS
25.0000 mg | ORAL_TABLET | Freq: Every day | ORAL | Status: DC
  Administered 2019-07-10 – 2019-07-25 (×16): 25 mg via ORAL
  Filled 2019-07-10 (×17): qty 1

## 2019-07-10 MED ORDER — POTASSIUM CHLORIDE CRYS ER 20 MEQ PO TBCR
40.0000 meq | EXTENDED_RELEASE_TABLET | ORAL | Status: AC
  Administered 2019-07-10 (×2): 40 meq via ORAL
  Filled 2019-07-10 (×2): qty 2

## 2019-07-10 MED ORDER — ORAL CARE MOUTH RINSE: 15.0000 mL | Freq: Two times a day (BID) | OROMUCOSAL | Status: DC

## 2019-07-10 MED ORDER — MIRTAZAPINE 15 MG PO TBDP
15.0000 mg | ORAL_TABLET | Freq: Every day | ORAL | Status: DC
  Administered 2019-07-11 – 2019-07-24 (×14): 15 mg via ORAL
  Filled 2019-07-10 (×16): qty 1

## 2019-07-10 NOTE — Progress Notes (Signed)
Pharmacy Brief Note  64 y/o M on XArelto for atrial fibrillation admitted with weakness currently on heparin drip with low anti-Xa goal due to severe anemia.   O: HL < 0.1  A: Infusion not paused per RN, no bleeding reported.   P: Increase heparin infusion to 1150 units/hr and recheck HL in 6 hours.   Ulice Dash, PharmD, BCPS

## 2019-07-10 NOTE — Progress Notes (Signed)
Events overnight noted.  Laboratory data reviewed.  His white cell count is improving which is predictable recovery from his last cycle of chemotherapy.  Hemoglobin remained stable.  Platelet count remains low although does not require any transfusion at this time.  I recommended continue supportive management as you are doing.  No additional transfusion or growth factor support is needed.  I have no objections to resuming anticoagulation.

## 2019-07-10 NOTE — Progress Notes (Signed)
NAME:  Tony Saunders, MRN:  [id number], DOB:  [birth year], LOS: 2 ADMISSION DATE:  07/08/2019, CONSULTATION DATE:  6/20 REFERRING MD:  Dr. Ashok Cordia, CHIEF COMPLAINT:  Weakness    Brief History   64 y/o M admitted 6/20 with reports of a 3 week history of diarrhea and progressive weakness.  Work up notable for pallor, febrile neutropenia, severe anemia (hgb 4.6) and heme positive stools (on xarelto).  The patient was recently admitted from 5/28-6/12 for hypotension, new AFwRVR s/p DCCV x2 (on amiodarone, xarelto, brilinta), pancytopenia requiring multiple transfusions.   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  Wore bipap briefly yesterday and with exertion for bath  Objective   Blood pressure (!) 98/52, pulse 94, temperature (!) 97.3 F (36.3 C), temperature source Axillary, resp. rate (!) 31, height 6' (1.829 m), weight 83.3 kg, SpO2 96 %. CVP:  [6 mmHg-10 mmHg] 10 mmHg  FiO2 (%):  [100 %] 100 %   Intake/Output Summary (Last 24 hours) at 07/10/2019 0354 Last data filed at 07/10/2019 6568 Gross per 24 hour  Intake 1850.99 ml  Output 2375 ml  Net -524.01 ml   Filed Weights   07/08/19 1403  07/10/19 0600  Weight: 77.1 kg 83.3 kg    Examination: General: chronically ill appearing adult male sitting up in bed in NAD  HEENT: MM pink/moist, [place] O2, anicteric  Neuro: AAOx4, speech clear, MAE, generalized weakness CV: s1s2 rrr, no m/r/g PULM: non-labored on [place] O2, coarse rhonchi bilaterally  GI: soft, bsx4 active  Extremities: warm/dry, 3+ BLE pitting edema  Skin: thin skin, scattered areas of ecchymosis   Resolved Hospital Problem list      Assessment & Plan:   Neutropenic Fever  Pancytopenia  Rule out opportunistic infection, no clear source sepsis  -appreciate HEME / ONC assistance with patient care  -neutropenic precautions  -follow CBC   Shock   Suspect element of hypovolemic with profound anemia, +/- septic, with additional decompensated systolic CHF  -stress dose steroids  -follow cultures -continue home midodrine   Acute Decompensated Systolic CHF  Hx AF, CAD, HLD, Chronic Combined Systolic / Diastolic CHF  On midodrine at baseline. SvO2 48 on 6/21, LVEF 35-40%  -continue home amiodarone, midodrine  -brilinta, xarelto, digoxin on hold  -appreciate Cardiology evaluation  -continue milrinone  -follow I/O's  -lasix 40 mg BID   Severe Anemia  Rule out GI bleed, FOBT positive on admit  -trend CBC -PPI  -hold anticoagulation, appreciate Cardiology input on anticoagulation  -may need GI eval at some point, not stable at present for EGD  Interstitial Edema  Acute Hypoxemic Respiratory Failure  Former Smoker -follow intermittent CXR  -O2 for sats 90-95% -pt willing to accept intubation if needed   Hypokalemia  Hypomagnesemia  -follow electrolytes, replace as indicated   Mild Troponin Leak  Suspect in setting of profound anemia, demand. EKG without ischemic changes.  -tele monitoring  -follow troponin   Diffuse Large Cell Lymphoma  Extensive disease involvement, s/p 2 cycles of chemotherapy with CHOP/rituximab, last on 6/11.  Thought per ONC to  be a potentially treatable condition.   -Per ONC, appreciate input  -follow blood counts closely, appear to be rebounding   Pressure Ulcer  Sacral pressure ulcer, present on admit -wound care per protocl  -pressure relieving measures   Hx Seizure  -keppra, lamictal   Best practice:  Diet: Clear liquid Pain/Anxiety/Delirium protocol (if indicated): n/a  VAP protocol (if indicated): n/a  DVT prophylaxis: SCD's  GI prophylaxis: PPI Glucose control: n/a  Mobility: as tolerated  Code Status: Full Code, pt would want intubation, CPR if necessary.  Would not want long term life support if no reasonable chance of recovery.  Family Communication: Patient updated on plan of care 6/22  Disposition: ICU   Labs   CBC: Recent Labs  Lab 07/05/19 1151 07/08/19 1416 07/09/19 1144 07/10/19 0634  WBC 6.9 0.1* 1.0* 2.3*  NEUTROABS  --  0.1*  --   --   HGB 7.5* 4.6* 8.9* 8.6*  HCT 24.7* 14.6* 27.3* 26.3*  MCV 89.2 85.4 87.5 85.7  PLT 123* 28* 29* 28*    Basic Metabolic Panel: Recent Labs  Lab 07/05/19 1151 07/08/19 1416 07/08/19 1515 07/09/19 1144 07/10/19 0634  NA 138 137  --  136 137  K 4.3 3.1*  --  3.2* 2.8*  CL 105 104  --  104 101  CO2 27 25  --  23 25  GLUCOSE 87 93  --  134* 108*  BUN 17 15  --  18 20  CREATININE 0.83 0.90  --  0.81 0.91  CALCIUM 8.3* 7.6*  --  7.2* 7.0*  MG  --   --  1.4*  --   --    GFR: Estimated Creatinine Clearance: 92.4 mL/min (by C-G formula based on SCr of 0.91 mg/dL). Recent Labs  Lab 07/05/19 1151 07/08/19 1415 07/08/19 1416 07/08/19 1552 07/08/19 1709 07/09/19 1144 07/10/19 0634  PROCALCITON  --   --   --   --  1.15  --   --   WBC 6.9  --  0.1*  --   --  1.0* 2.3*  LATICACIDVEN  --  2.7*  --  0.7  --   --   --     Liver Function Tests: Recent Labs  Lab 07/05/19 1151 07/08/19 1416 07/10/19 0634  AST 20 23 17   ALT 28 23 24   ALKPHOS 67 44 50  BILITOT 0.7 0.8 1.0  PROT 4.3* 4.1* 4.2*  ALBUMIN 2.3* 2.0* 1.9*   No results  for input(s): LIPASE, AMYLASE in the last 168 hours. No results for input(s): AMMONIA in the last 168 hours.  ABG    Component Value Date/Time   HCO3 22.0 03/29/2019 1012   TCO2 23 03/29/2019 1012   ACIDBASEDEF 2.0 03/29/2019 1012   O2SAT 55.1 07/10/2019 0634     Coagulation Profile: Recent Labs  Lab 07/08/19 1416  INR 1.8*    Cardiac Enzymes: No results for input(s): CKTOTAL, CKMB, CKMBINDEX, TROPONINI in the last 168 hours.  HbA1C: Hgb A1c MFr Bld  Date/Time Value Ref Range Status  03/27/2019 07:06 AM 5.3 4.8 - 5.6 % Final    Comment:    (NOTE) Pre diabetes:          5.7%-6.4% Diabetes:              >6.4% Glycemic control for   <7.0% adults with diabetes     CBG: No results for input(s): GLUCAP in the last 168 hours.   Critical care time: 60 minutes       Noe Gens, MSN, NP-C [facility] 07/10/2019, 9:11 AM   Please see [email or web address] for pager details.

## 2019-07-10 NOTE — Progress Notes (Signed)
ANTICOAGULATION CONSULT NOTE - Initial Consult  Pharmacy Consult for Heparin Indication: atrial fibrillation  No Known Allergies  Patient Measurements: Height: 6' (182.9 cm) Weight: 83.3 kg (183 lb 10.3 oz) IBW/kg (Calculated) : 77.6 Heparin Dosing Weight: 83kg  Vital Signs: Temp: 97.7 F (36.5 C) (06/22 1200) Temp Source: Oral (06/22 1200) BP: 113/57 (06/22 1000) Pulse Rate: 29 (06/22 1000)  Labs: Recent Labs    07/08/19 1416 07/08/19 1416 07/08/19 1558 07/09/19 1144 07/10/19 0634  HGB 4.6*   < >  --  8.9* 8.6*  HCT 14.6*  --   --  27.3* 26.3*  PLT 28*  --   --  29* 28*  LABPROT 20.3*  --   --   --   --   INR 1.8*  --   --   --   --   CREATININE 0.90  --   --  0.81 0.91  TROPONINIHS 96*  --  96*  --   --    < > = values in this interval not displayed.    Estimated Creatinine Clearance: 92.4 mL/min (by C-G formula based on SCr of 0.91 mg/dL).   Medical History: Past Medical History:  Diagnosis Date  . 3vessel CAD- S/P PCI 03/27/2019   Remote pRCA PCI-stenting 2006. Acute MI 03/27/2019 treated with urgent m-d RCA PCI and stent (x 2) followed by staged PCI DES to CFX (x2 stents) and LAD (x1) on 03/29/2019  . Acute ST elevation myocardial infarction (STEMI) of inferior wall ([place]) 03/27/2019   Pt presented 03/27/2019 with an acute inferior MI- Cath 03/27/19 showed thrombotic occlusion of mid-distal RCA with a patent previously placed pRCA stent -2 overlapping Synergy DES from PDA back into proximal RCA stented segment and PTCA of RPAV (jailed)-PL 3 He also had concomitant high garde CFX and LAD disease (staged PCI) with an EF of 30-35%  . Chronic combined systolic and diastolic CHF, NYHA class 2 and ACC/AHA stage C ([place]) 03/27/2019   EF 30 to 40% with diffuse inferior hypokinesis/akinesis; severe ischemic MR  . 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 Myocardial infarction [facility]) 2006   PCI of the RCA  . PAF (persistent-paroxysmal  atrial fibrillation) ([place]) 04/16/2019   Brief- post MI  . Seizure, temporal lobe ([place]) 2017   most recent 07/23/15-on Keppra and Lamictal  . Severe mitral regurgitation by prior echocardiogram-likely ischemic with tethered posterior leaflet 03/31/2019   Repeat echo 4-6 weeks post MI    Medications:  Scheduled:  . amiodarone  200 mg Oral Daily  . Chlorhexidine Gluconate Cloth  6 each Topical Daily  . digoxin  0.125 mg Oral Daily  . feeding supplement  1 Container Oral BID BM  . feeding supplement (PRO-STAT SUGAR FREE 64)  30 mL Oral TID BM  . furosemide  40 mg Intravenous BID  . hydrocortisone sodium succinate  100 mg Intravenous Q8H  . lamoTRIgine  100 mg Oral BID  . levETIRAcetam  750 mg Oral BID  . mouth rinse  15 mL Mouth Rinse q12n4p  . midodrine  5 mg Oral TID WC  . [START ON 07/11/2019] mirtazapine  15 mg Oral QHS  . multivitamin with minerals  1 tablet Oral Daily  . pantoprazole (PROTONIX) IV  40 mg Intravenous Q24H  . sertraline  25 mg Oral Daily  . sodium chloride flush  10-40 mL Intracatheter Q12H   Infusions:  . ceFEPime (MAXIPIME) IV Stopped (07/10/19 1015)  . metronidazole 500  mg (07/10/19 1205)  . milrinone 0.25 mcg/kg/min (07/10/19 0815)  . vancomycin 750 mg (07/10/19 1059)   PRN: docusate sodium, ipratropium, levalbuterol, polyethylene glycol, sodium chloride flush  Assessment: 64 yo male on xarelto for afib.  Currently on chemo for diffuse large B-cell lymphoma - rituximab / CHOP, last given 6/11.  Admitted 6/20 with weakness, Hgb 4.6 - transfused and xarelto held (last dose taken 6/19).  Pharmacy consulted to begin IV heparin - CCM, Cardiology and Oncology all aware of low Hgb and Plts.  Goal of Therapy:  Heparin level 0.3-0.5 units/ml Monitor platelets by anticoagulation protocol: Yes   Plan:   No bolus per MD  Heparin IV infusion 900 units/hr  Check heparin level in 6hrs  Daily heparin level and CBC  Peggyann Juba, PharmD, BCPS Pharmacy:  [phone] 07/10/2019,12:32 PM

## 2019-07-10 NOTE — Progress Notes (Signed)
Pt seen, found on 10L high flow nasal cannula.  HR91, rr25-29, spo2 95%.  No increased wob/respiratory distress noted or voiced by pt at this time.  Bipap not indicated but remains in room on standby.

## 2019-07-10 NOTE — Progress Notes (Signed)
Progress Note  Patient Name: Tony Saunders Date of Encounter: 07/10/2019  [facility] Cardiologist: Glenetta Hew, MD   Subjective   Feeling slightly better.  Still coughing and short of breath.  Inpatient Medications    Scheduled Meds: . amiodarone  200 mg Oral Daily  . Chlorhexidine Gluconate Cloth  6 each Topical Daily  . feeding supplement  1 Container Oral BID BM  . feeding supplement (PRO-STAT SUGAR FREE 64)  30 mL Oral TID BM  . furosemide  40 mg Intravenous BID  . hydrocortisone sodium succinate  100 mg Intravenous Q8H  . lamoTRIgine  100 mg Oral BID  . levETIRAcetam  750 mg Oral BID  . mouth rinse  15 mL Mouth Rinse q12n4p  . midodrine  5 mg Oral TID WC  . [START ON 07/11/2019] mirtazapine  15 mg Oral QHS  . multivitamin with minerals  1 tablet Oral Daily  . pantoprazole (PROTONIX) IV  40 mg Intravenous Q24H  . potassium chloride  40 mEq Oral Q4H  . sertraline  25 mg Oral Daily  . sodium chloride flush  10-40 mL Intracatheter Q12H   Continuous Infusions: . ceFEPime (MAXIPIME) IV Stopped (07/10/19 1015)  . metronidazole Stopped (07/10/19 0410)  . milrinone 0.25 mcg/kg/min (07/10/19 0815)  . vancomycin 750 mg (07/10/19 1059)   PRN Meds: docusate sodium, ipratropium, levalbuterol, polyethylene glycol, sodium chloride flush   Vital Signs    Vitals:   07/10/19 0700 07/10/19 0800 07/10/19 0823 07/10/19 1000  BP: 132/65 (!) 98/52  (!) 113/57  Pulse: 98 96 94 (!) 29  Resp: (!) 27 (!) 31 (!) 31 (!) 29  Temp:  (!) 97.3 F (36.3 C)    TempSrc:  Axillary    SpO2: (!) 87% 96% 96% (!) 84%  Weight:      Height:        Intake/Output Summary (Last 24 hours) at 07/10/2019 1153 Last data filed at 07/10/2019 0615 Gross per 24 hour  Intake 1006.43 ml  Output 2375 ml  Net -1368.57 ml   Last 3 Weights 07/10/2019 07/08/2019 07/05/2019  Weight (lbs) 183 lb 10.3 oz 170 lb (No Data)  Weight (kg) 83.3 kg 77.111 kg (No Data)      Telemetry    Atrial flutter.   Rate <100 bpm.   - Personally Reviewed  ECG    n/a - Personally Reviewed  Physical Exam   VS:  BP (!) 113/57   Pulse (!) 29   Temp (!) 97.3 F (36.3 C) (Axillary)   Resp (!) 29   Ht 6' (1.829 m)   Wt 83.3 kg   SpO2 (!) 84%   BMI 24.91 kg/m  , BMI Body mass index is 24.91 kg/m. GENERAL: Chronically ill-appearing HEENT: Pupils equal round and reactive, fundi not visualized, oral mucosa unremarkable NECK:  No jugular venous distention, waveform within normal limits, carotid upstroke brisk and symmetric, no bruits LUNGS:  Rhonchi throughout.  No crackles HEART:  Irregularly irregular.  Difficult to auscultate 2/2 coarse rhonchi ABD:  Flat, positive bowel sounds normal in frequency in pitch, no bruits, no rebound, no guarding, no midline pulsatile mass, no hepatomegaly, no splenomegaly EXT:  2 plus pulses throughout, 2+ pitting edema to upper tibia bilaterally, no cyanosis no clubbing SKIN:  Multiple ecchymoses NEURO:  Cranial nerves II through XII grossly intact, motor grossly intact throughout PSYCH:  Cognitively intact, oriented to person place and time   Labs    High Sensitivity Troponin:   Recent Labs  Lab 07/08/19 1416 07/08/19 1558  TROPONINIHS 96* 96*      Chemistry Recent Labs  Lab 07/05/19 1151 07/05/19 1151 07/08/19 1416 07/09/19 1144 07/10/19 0634  NA 138   < > 137 136 137  K 4.3   < > 3.1* 3.2* 2.8*  CL 105   < > 104 104 101  CO2 27   < > 25 23 25   GLUCOSE 87   < > 93 134* 108*  BUN 17   < > 15 18 20   CREATININE 0.83   < > 0.90 0.81 0.91  CALCIUM 8.3*   < > 7.6* 7.2* 7.0*  PROT 4.3*  --  4.1*  --  4.2*  ALBUMIN 2.3*  --  2.0*  --  1.9*  AST 20  --  23  --  17  ALT 28  --  23  --  24  ALKPHOS 67  --  44  --  50  BILITOT 0.7  --  0.8  --  1.0  GFRNONAA >60   < > >60 >60 >60  GFRAA >60   < > >60 >60 >60  ANIONGAP 6   < > 8 9 11    < > = values in this interval not displayed.     Hematology Recent Labs  Lab 07/08/19 1416 07/09/19 1144  07/10/19 0634  WBC 0.1* 1.0* 2.3*  RBC 1.71* 3.12* 3.07*  HGB 4.6* 8.9* 8.6*  HCT 14.6* 27.3* 26.3*  MCV 85.4 87.5 85.7  MCH 26.9 28.5 28.0  MCHC 31.5 32.6 32.7  RDW 18.9* 16.9* 17.3*  PLT 28* 29* 28*    BNP Recent Labs  Lab 07/05/19 1151 07/08/19 1417  BNP 404.8* 597.3*     DDimer No results for input(s): DDIMER in the last 168 hours.   Radiology    DG Chest Port 1 View  Result Date: 07/08/2019 CLINICAL DATA:  Fever and possible sepsis. EXAM: PORTABLE CHEST 1 VIEW COMPARISON:  06/24/2019 FINDINGS: Right-sided PICC line unchanged. Lungs are adequately inflated with hazy opacification over the perihilar regions and lung bases as well as right upper lung. Changes over the right upper lung are new/worse. Small to moderate left pleural effusion likely with associated basilar atelectasis slightly worse. Cardiomediastinal silhouette and remainder the exam is unchanged. IMPRESSION: 1. Slight worsening bilateral multifocal airspace process likely due to infection. 2. Possible component of interstitial edema. Worsening moderate size left effusion likely with associated basilar atelectasis. Electronically Signed   By: Marin Olp M.D.   On: 07/08/2019 14:34    Cardiac Studies   TTE 05/14/19  IMPRESSIONS    1. Compared to echo in March 2021, no significant change in LVEF or MR.  Pt is now in atrial fibrillation.  2. LVEF is severely depressed with severe hypokinesis/akinesis of the  base/mid inferior, inferosepal, infer walls and apical walls; hypokinesis  elsewhere . Left ventricular ejection fraction, by estimation, is 30%. The  left ventricle has severely  decreased function. The left ventricular internal cavity size was mildly  dilated. There is mild left ventricular hypertrophy. Left ventricular  diastolic parameters are indeterminate.  3. Right ventricular systolic function is normal. The right ventricular  size is normal. There is normal pulmonary artery systolic  pressure.  4. Left atrial size was mildly dilated.  5. MR is directed posteriorly into LA, likely due to tethering of  posterior leaflet. Severe mitral valve regurgitation.  6. The aortic valve is tricuspid. Aortic valve regurgitation is not  visualized. Mild aortic  valve sclerosis is present, with no evidence of  aortic valve stenosis.  7. The inferior vena cava is normal in size with greater than 50%  respiratory variability, suggesting right atrial pressure of 3 mmHg.   FINDINGS  Left Ventricle: LVEF is severely depressed with severe  hypokinesis/akinesis of the base/mid inferior, inferosepal, infer walls  and apical walls; hypokinesis elsewhere. Left ventricular ejection  fraction, by estimation, is 30%%. The left ventricle has  severely decreased function. The left ventricle demonstrates regional wall  motion abnormalities. The left ventricular internal cavity size was mildly  dilated. There is mild left ventricular hypertrophy. Left ventricular  diastolic parameters are  indeterminate.   Right Ventricle: The right ventricular size is normal. Right vetricular  wall thickness was not assessed. Right ventricular systolic function is  normal. There is normal pulmonary artery systolic pressure. The tricuspid  regurgitant velocity is 2.41 m/s,  and with an assumed right atrial pressure of 10 mmHg, the estimated right  ventricular systolic pressure is 50.3 mmHg.   Left Atrium: Left atrial size was mildly dilated.   Right Atrium: Right atrial size was normal in size.   Pericardium: Trivial pericardial effusion is present.   Mitral Valve: MR is directed posteriorly into LA, likely due to tethering  of posterior leaflet. The mitral valve is grossly normal. Severe mitral  valve regurgitation, with posteriorly-directed jet.   Tricuspid Valve: The tricuspid valve is normal in structure. Tricuspid  valve regurgitation is mild.   Aortic Valve: The aortic valve is tricuspid.  Aortic valve regurgitation is  not visualized. Mild aortic valve sclerosis is present, with no evidence  of aortic valve stenosis.   Pulmonic Valve: The pulmonic valve was normal in structure. Pulmonic valve  regurgitation is not visualized.   Aorta: The aortic root and ascending aorta are structurally normal, with  no evidence of dilitation.   Venous: The inferior vena cava is normal in size with greater than 50%  respiratory variability, suggesting right atrial pressure of 3 mmHg.   IAS/Shunts: No atrial level shunt detected by color flow Doppler.    Cardiac cath and PCI 03/29/19 Diagnostic Dominance: Right  Intervention     Patient Profile     Mr. Maule is a 27M with CAD s/p recent PCI in all three coronary distributions (last 03/2019), persistent atrial fib/flutter s/p multiple DCCV (last 05/22/63), chronic systolic and diastolic heart failure (LVEF 30-35%), moderate-severe MR, and recently diagnosed diffuse large cell lymphoma s/p 2 cycles of R-CHOP admitted with weakness, pancytopenia/neutropenia, and acute on chronic heart failure.    Assessment & Plan    # Cardiogenic shock: #Possible septic shock Feeling a little bit better since starting milrinone.  CVP is 10 mmHg.  He continues to have significant lower extremity edema, though this is likely due to both heart failure and low albumin.  Co-ox slightly improved from 48.8 to 55.1.  Renal function stable with diuresis.  He remains volume overloaded.  We will continue with Lasix 40 mg IV for 1 more dose and likely switch to once daily tomorrow pending renal function, CVP and co-ox.  Continue midodrine and resume digoxin.  # Persistent atrial flutter:  He is back in flutter today.  If anything, symptoms are improving.  No plans to pursue repeat cardioversion at this time.  Per Dr. Alen Blew, okay to resume anticoagulation.  We will start with IV heparin, given his pancytopenia.  No bolus.  Continue to hold Xarelto given the  concern that there may also be GI  bleeding.  Continue to watch blood counts closely.  Resuming digoxin as above.  # CAD s/p multiple PCI: Most recent PCI 03/2019.  Antiplatelets on hold due to pancytopenia as above.  Resuming IV heparin.  Continue to monitor blood counts closely and consider resuming as platelets and RBCs improve.  No beta blocker 2/2 shock.    # Diffuse large B-cell lymphoma: Actively being treated.  Had 2 cycles of R-CHOP.  Followed by Dr. Alen Blew.  Time spent: 40 minutes-Greater than 50% of this time was spent in counseling, explanation of diagnosis, planning of further management, and coordination of care.      For questions or updates, please contact [place] Please consult [email or web address] for contact info under        Signed, Skeet Latch, MD  07/10/2019, 11:53 AM

## 2019-07-11 ENCOUNTER — Ambulatory Visit: Payer: PRIVATE HEALTH INSURANCE | Admitting: Internal Medicine

## 2019-07-11 ENCOUNTER — Other Ambulatory Visit: Payer: Self-pay | Admitting: Diagnostic Neuroimaging

## 2019-07-11 ENCOUNTER — Inpatient Hospital Stay (HOSPITAL_COMMUNITY): Payer: No Typology Code available for payment source

## 2019-07-11 LAB — CBC
HCT: 24.8 % — ABNORMAL LOW (ref 39.0–52.0)
Hemoglobin: 8 g/dL — ABNORMAL LOW (ref 13.0–17.0)
MCH: 28.3 pg (ref 26.0–34.0)
MCHC: 32.3 g/dL (ref 30.0–36.0)
MCV: 87.6 fL (ref 80.0–100.0)
Platelets: 25 10*3/uL — CL (ref 150–400)
RBC: 2.83 MIL/uL — ABNORMAL LOW (ref 4.22–5.81)
RDW: 17.4 % — ABNORMAL HIGH (ref 11.5–15.5)
WBC: 3.8 10*3/uL — ABNORMAL LOW (ref 4.0–10.5)
nRBC: 0 % (ref 0.0–0.2)

## 2019-07-11 LAB — COOXEMETRY PANEL
Carboxyhemoglobin: 1.2 % (ref 0.5–1.5)
Methemoglobin: 0.8 % (ref 0.0–1.5)
O2 Saturation: 87.6 %
Total hemoglobin: 7.9 g/dL — ABNORMAL LOW (ref 12.0–16.0)

## 2019-07-11 LAB — BASIC METABOLIC PANEL
Anion gap: 9 (ref 5–15)
BUN: 21 mg/dL (ref 8–23)
CO2: 26 mmol/L (ref 22–32)
Calcium: 6.6 mg/dL — ABNORMAL LOW (ref 8.9–10.3)
Chloride: 102 mmol/L (ref 98–111)
Creatinine, Ser: 0.89 mg/dL (ref 0.61–1.24)
GFR calc Af Amer: >60 mL/min (ref >60)
GFR calc non Af Amer: >60 mL/min (ref >60)
Glucose, Bld: 128 mg/dL — ABNORMAL HIGH (ref 70–99)
Potassium: 2.5 mmol/L — CL (ref 3.5–5.1)
Sodium: 137 mmol/L (ref 135–145)

## 2019-07-11 LAB — HEPARIN LEVEL (UNFRACTIONATED)
Heparin Unfractionated: <0.1 IU/mL — ABNORMAL LOW (ref 0.30–0.70)
Heparin Unfractionated: 0.12 IU/mL — ABNORMAL LOW (ref 0.30–0.70)
Heparin Unfractionated: 0.25 IU/mL — ABNORMAL LOW (ref 0.30–0.70)

## 2019-07-11 MED ORDER — POTASSIUM CHLORIDE 10 MEQ/50ML IV SOLN
10.0000 meq | INTRAVENOUS | Status: AC
  Administered 2019-07-11 (×6): 10 meq via INTRAVENOUS
  Filled 2019-07-11 (×6): qty 50

## 2019-07-11 MED ORDER — ORAL CARE MOUTH RINSE
15.0000 mL | Freq: Two times a day (BID) | OROMUCOSAL | Status: DC
  Administered 2019-07-12 – 2019-07-23 (×16): 15 mL via OROMUCOSAL

## 2019-07-11 MED ORDER — HEPARIN (PORCINE) 25000 UT/250ML-% IV SOLN
1450.0000 [IU]/h | INTRAVENOUS | Status: DC
  Administered 2019-07-11: 1300 [IU]/h via INTRAVENOUS
  Filled 2019-07-11: qty 250

## 2019-07-11 MED ORDER — MAGNESIUM OXIDE 400 (241.3 MG) MG PO TABS
400.0000 mg | ORAL_TABLET | Freq: Two times a day (BID) | ORAL | Status: AC
  Administered 2019-07-11 (×2): 400 mg via ORAL
  Filled 2019-07-11 (×2): qty 1

## 2019-07-11 MED ORDER — FUROSEMIDE 10 MG/ML IJ SOLN
80.0000 mg | Freq: Two times a day (BID) | INTRAMUSCULAR | Status: DC
  Administered 2019-07-11 – 2019-07-12 (×2): 80 mg via INTRAVENOUS
  Filled 2019-07-11 (×2): qty 8

## 2019-07-11 MED ORDER — FUROSEMIDE 10 MG/ML IJ SOLN
40.0000 mg | Freq: Once | INTRAMUSCULAR | Status: AC
  Administered 2019-07-11: 40 mg via INTRAVENOUS
  Filled 2019-07-11: qty 4

## 2019-07-11 MED ORDER — CHLORHEXIDINE GLUCONATE 0.12 % MT SOLN
15.0000 mL | Freq: Two times a day (BID) | OROMUCOSAL | Status: DC
  Administered 2019-07-12 – 2019-07-24 (×22): 15 mL via OROMUCOSAL
  Filled 2019-07-11 (×22): qty 15

## 2019-07-11 NOTE — Progress Notes (Addendum)
CRITICAL VALUE ALERT  Critical Value:  Potassium=2.5  Date & Time Notied:  07/11/19; 05:58  Provider Notified: Called E-link  Orders Received/Actions taken:Waiting for order

## 2019-07-11 NOTE — Progress Notes (Signed)
ANTICOAGULATION CONSULT NOTE - Follow Up Consult  Pharmacy Consult for Heparin Indication: atrial fibrillation  No Known Allergies  Patient Measurements: Height: 6' (182.9 cm) Weight: 85.4 kg (188 lb 4.4 oz) IBW/kg (Calculated) : 77.6 Heparin Dosing Weight: 85.4kg  Vital Signs: Temp: 97.8 F (36.6 C) (06/23 0350) Temp Source: Axillary (06/23 0350) BP: 100/52 (06/23 0200) Pulse Rate: 70 (06/23 0200)  Labs: Recent Labs    07/08/19 1416 07/08/19 1416 07/08/19 1558 07/09/19 1144 07/09/19 1144 07/10/19 0634 07/10/19 1258 07/10/19 1949 07/11/19 0514 07/11/19 0552  HGB 4.6*   < >  --  8.9*   < > 8.6*  --   --  8.0*  --   HCT 14.6*   < >  --  27.3*  --  26.3*  --   --  24.8*  --   PLT 28*   < >  --  29*  --  28*  --   --  25*  --   APTT  --   --   --   --   --   --  36  --   --   --   LABPROT 20.3*  --   --   --   --   --  14.9  --   --   --   INR 1.8*  --   --   --   --   --  1.2  --   --   --   HEPARINUNFRC  --   --   --   --   --   --  <0.10* <0.10*  --  <0.10*  CREATININE 0.90   < >  --  0.81  --  0.91  --   --  0.89  --   TROPONINIHS 96*  --  96*  --   --   --   --   --   --   --    < > = values in this interval not displayed.    Estimated Creatinine Clearance: 94.5 mL/min (by C-G formula based on SCr of 0.89 mg/dL).   Medical History: Past Medical History:  Diagnosis Date  . 3vessel CAD- S/P PCI 03/27/2019   Remote pRCA PCI-stenting 2006. Acute MI 03/27/2019 treated with urgent m-d RCA PCI and stent (x 2) followed by staged PCI DES to CFX (x2 stents) and LAD (x1) on 03/29/2019  . Acute ST elevation myocardial infarction (STEMI) of inferior wall ([place]) 03/27/2019   Pt presented 03/27/2019 with an acute inferior MI- Cath 03/27/19 showed thrombotic occlusion of mid-distal RCA with a patent previously placed pRCA stent -2 overlapping Synergy DES from PDA back into proximal RCA stented segment and PTCA of RPAV (jailed)-PL 3 He also had concomitant high garde CFX and LAD disease  (staged PCI) with an EF of 30-35%  . Chronic combined systolic and diastolic CHF, NYHA class 2 and ACC/AHA stage C ([place]) 03/27/2019   EF 30 to 40% with diffuse inferior hypokinesis/akinesis; severe ischemic MR  . 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 Myocardial infarction [facility]) 2006   PCI of the RCA  . PAF (persistent-paroxysmal atrial fibrillation) ([place]) 04/16/2019   Brief- post MI  . Seizure, temporal lobe ([place]) 2017   most recent 07/23/15-on Keppra and Lamictal  . Severe mitral regurgitation by prior echocardiogram-likely ischemic with tethered posterior leaflet 03/31/2019   Repeat echo 4-6 weeks post MI    Medications:  Scheduled:  . amiodarone  200 mg Oral Daily  . Chlorhexidine Gluconate Cloth  6 each Topical Daily  . digoxin  0.125 mg Oral Daily  . feeding supplement  1 Container Oral BID BM  . feeding supplement (PRO-STAT SUGAR FREE 64)  30 mL Oral TID BM  . furosemide  40 mg Intravenous BID  . hydrocortisone sodium succinate  100 mg Intravenous Q8H  . lamoTRIgine  100 mg Oral BID  . levETIRAcetam  750 mg Oral BID  . mouth rinse  15 mL Mouth Rinse q12n4p  . midodrine  5 mg Oral TID WC  . mirtazapine  15 mg Oral QHS  . multivitamin with minerals  1 tablet Oral Daily  . pantoprazole (PROTONIX) IV  40 mg Intravenous Q24H  . sertraline  25 mg Oral Daily  . sodium chloride flush  10-40 mL Intracatheter Q12H   Infusions:  . ceFEPime (MAXIPIME) IV Stopped (07/11/19 0226)  . heparin 1,150 Units/hr (07/11/19 0146)  . metronidazole Stopped (07/11/19 0438)  . milrinone 0.25 mcg/kg/min (07/11/19 0146)  . potassium chloride    . vancomycin Stopped (07/11/19 0327)   PRN: docusate sodium, ipratropium, levalbuterol, polyethylene glycol, sodium chloride flush  Assessment: 64 yo male on xarelto for afib.  Currently on chemo for diffuse large B-cell lymphoma - rituximab / CHOP, last given 6/11.  Admitted 6/20 with weakness, Hgb 4.6 -  transfused and xarelto held (last dose taken 6/19).  Pharmacy consulted to begin IV heparin - CCM, Cardiology and Oncology all aware of low Hgb and Plts.  07/11/2019  Heparin levels remains undetectable despite rate increase  Hgb 8.0 s/p transfusion 6/21; Plts low s/p chemo  No visible bleeding documented  Goal of Therapy:  Heparin level 0.3-0.5 units/ml Monitor platelets by anticoagulation protocol: Yes   Plan:   No bolus per MD  Increase Heparin IV infusion to 1300 units/hr (~16 units/kg/hr)  Check heparin level in 6hrs  Daily heparin level and CBC  Peggyann Juba, PharmD, BCPS Pharmacy: [phone] 07/11/2019,7:06 AM

## 2019-07-11 NOTE — Progress Notes (Signed)
[place] Progress Note Patient Name: [name] DOB: [birth year] MRN: [id number]   Date of Service  07/11/2019  HPI/Events of Note  Hypokalemia - K+ = 2.5 and Creatinine = 0.89.  eICU Interventions  Will replace K+.      Intervention Category Major Interventions: Electrolyte abnormality - evaluation and management  Dadrian Ballantine [place] 07/11/2019, 6:07 AM

## 2019-07-11 NOTE — Progress Notes (Signed)
Pharmacy Brief Note  64 y/o M on XArelto for atrial fibrillation admitted with weakness currently on heparin drip with low anti-Xa goal due to severe anemia.   O: HL 0.12  A: No issues with heparin infusion, not paused per RN.   P: Increase heparin infusion to 1450 units/hr. Conservative increase due to low goal. Recheck HL in 6 hours.   Ulice Dash, PharmD, BCPS

## 2019-07-11 NOTE — Progress Notes (Signed)
Progress Note  Patient Name: Tony Saunders Date of Encounter: 07/11/2019  [facility] Cardiologist: Glenetta Hew, MD   Subjective   Feeling more short of breath today, especially with exertion.  Thinks legs are getting better.   Inpatient Medications    Scheduled Meds: . amiodarone  200 mg Oral Daily  . Chlorhexidine Gluconate Cloth  6 each Topical Daily  . digoxin  0.125 mg Oral Daily  . feeding supplement  1 Container Oral BID BM  . feeding supplement (PRO-STAT SUGAR FREE 64)  30 mL Oral TID BM  . furosemide  40 mg Intravenous BID  . hydrocortisone sodium succinate  100 mg Intravenous Q8H  . lamoTRIgine  100 mg Oral BID  . levETIRAcetam  750 mg Oral BID  . mouth rinse  15 mL Mouth Rinse q12n4p  . midodrine  5 mg Oral TID WC  . mirtazapine  15 mg Oral QHS  . multivitamin with minerals  1 tablet Oral Daily  . pantoprazole (PROTONIX) IV  40 mg Intravenous Q24H  . sertraline  25 mg Oral Daily  . sodium chloride flush  10-40 mL Intracatheter Q12H   Continuous Infusions: . ceFEPime (MAXIPIME) IV Stopped (07/11/19 1056)  . heparin 1,300 Units/hr (07/11/19 1109)  . metronidazole 500 mg (07/11/19 1102)  . milrinone 0.25 mcg/kg/min (07/11/19 1121)  . potassium chloride 10 mEq (07/11/19 1019)  . vancomycin Stopped (07/11/19 0327)   PRN Meds: docusate sodium, ipratropium, levalbuterol, polyethylene glycol, sodium chloride flush   Vital Signs    Vitals:   07/11/19 0900 07/11/19 1021 07/11/19 1029 07/11/19 1044  BP:      Pulse: 92 (!) 102 (!) 103 (!) 101  Resp: (!) 30 (!) 30  (!) 27  Temp:      TempSrc:      SpO2: 100% (!) 86%  93%  Weight:      Height:        Intake/Output Summary (Last 24 hours) at 07/11/2019 1122 Last data filed at 07/11/2019 0900 Gross per 24 hour  Intake 1599.92 ml  Output 1875 ml  Net -275.08 ml   Last 3 Weights 07/11/2019 07/10/2019 07/08/2019  Weight (lbs) 188 lb 4.4 oz 183 lb 10.3 oz 170 lb  Weight (kg) 85.4 kg 83.3 kg 77.111 kg        Telemetry    Atrial flutter.  Rate <100 bpm.   - Personally Reviewed  ECG    n/a - Personally Reviewed  Physical Exam   VS:  BP 118/64 (BP Location: Left Arm)   Pulse (!) 101   Temp (!) 96.9 F (36.1 C) (Axillary)   Resp (!) 27   Ht 6' (1.829 m)   Wt 85.4 kg   SpO2 93%   BMI 25.53 kg/m  , BMI Body mass index is 25.53 kg/m. GENERAL: Chronically ill-appearing HEENT: Pupils equal round and reactive, fundi not visualized, oral mucosa unremarkable NECK:  No jugular venous distention, waveform within normal limits, carotid upstroke brisk and symmetric, no bruits LUNGS:  Rhonchi throughout.  No crackles HEART:  RRR.  Difficult to auscultate 2/2 coarse rhonchi ABD:  Flat, positive bowel sounds normal in frequency in pitch, no bruits, no rebound, no guarding, no midline pulsatile mass, no hepatomegaly, no splenomegaly EXT:  2 plus pulses throughout, 2+ pitting edema to upper tibia bilaterally-improving, no cyanosis no clubbing SKIN:  Multiple ecchymoses NEURO:  Cranial nerves II through XII grossly intact, motor grossly intact throughout PSYCH:  Cognitively intact, oriented to person place  and time   Labs    High Sensitivity Troponin:   Recent Labs  Lab 07/08/19 1416 07/08/19 1558  TROPONINIHS 96* 96*      Chemistry Recent Labs  Lab 07/05/19 1151 07/05/19 1151 07/08/19 1416 07/08/19 1416 07/09/19 1144 07/10/19 0634 07/11/19 0514  NA 138   < > 137   < > 136 137 137  K 4.3   < > 3.1*   < > 3.2* 2.8* 2.5*  CL 105   < > 104   < > 104 101 102  CO2 27   < > 25   < > 23 25 26   GLUCOSE 87   < > 93   < > 134* 108* 128*  BUN 17   < > 15   < > 18 20 21   CREATININE 0.83   < > 0.90   < > 0.81 0.91 0.89  CALCIUM 8.3*   < > 7.6*   < > 7.2* 7.0* 6.6*  PROT 4.3*  --  4.1*  --   --  4.2*  --   ALBUMIN 2.3*  --  2.0*  --   --  1.9*  --   AST 20  --  23  --   --  17  --   ALT 28  --  23  --   --  24  --   ALKPHOS 67  --  44  --   --  50  --   BILITOT 0.7  --  0.8  --   --   1.0  --   GFRNONAA >60   < > >60   < > >60 >60 >60  GFRAA >60   < > >60   < > >60 >60 >60  ANIONGAP 6   < > 8   < > 9 11 9    < > = values in this interval not displayed.     Hematology Recent Labs  Lab 07/09/19 1144 07/10/19 0634 07/11/19 0514  WBC 1.0* 2.3* 3.8*  RBC 3.12* 3.07* 2.83*  HGB 8.9* 8.6* 8.0*  HCT 27.3* 26.3* 24.8*  MCV 87.5 85.7 87.6  MCH 28.5 28.0 28.3  MCHC 32.6 32.7 32.3  RDW 16.9* 17.3* 17.4*  PLT 29* 28* 25*    BNP Recent Labs  Lab 07/05/19 1151 07/08/19 1417  BNP 404.8* 597.3*     DDimer No results for input(s): DDIMER in the last 168 hours.   Radiology    DG CHEST PORT 1 VIEW  Result Date: 07/11/2019 CLINICAL DATA:  Respiratory failure. EXAM: PORTABLE CHEST 1 VIEW COMPARISON:  Single-view of the chest 07/08/2019. FINDINGS: Right PICC is unchanged. Diffuse bilateral airspace disease is much worse than on the prior study. There are likely small to moderate bilateral pleural effusions. No pneumothorax. Heart size is normal. IMPRESSION: Marked worsening diffuse bilateral airspace disease consistent with progressive pneumonia and/or pulmonary edema. Electronically Signed   By: Inge Rise M.D.   On: 07/11/2019 08:55    Cardiac Studies   TTE 05/14/19  IMPRESSIONS    1. Compared to echo in March 2021, no significant change in LVEF or MR.  Pt is now in atrial fibrillation.  2. LVEF is severely depressed with severe hypokinesis/akinesis of the  base/mid inferior, inferosepal, infer walls and apical walls; hypokinesis  elsewhere . Left ventricular ejection fraction, by estimation, is 30%. The  left ventricle has severely  decreased function. The left ventricular internal cavity size was mildly  dilated. There is mild  left ventricular hypertrophy. Left ventricular  diastolic parameters are indeterminate.  3. Right ventricular systolic function is normal. The right ventricular  size is normal. There is normal pulmonary artery systolic  pressure.  4. Left atrial size was mildly dilated.  5. MR is directed posteriorly into LA, likely due to tethering of  posterior leaflet. Severe mitral valve regurgitation.  6. The aortic valve is tricuspid. Aortic valve regurgitation is not  visualized. Mild aortic valve sclerosis is present, with no evidence of  aortic valve stenosis.  7. The inferior vena cava is normal in size with greater than 50%  respiratory variability, suggesting right atrial pressure of 3 mmHg.   FINDINGS  Left Ventricle: LVEF is severely depressed with severe  hypokinesis/akinesis of the base/mid inferior, inferosepal, infer walls  and apical walls; hypokinesis elsewhere. Left ventricular ejection  fraction, by estimation, is 30%%. The left ventricle has  severely decreased function. The left ventricle demonstrates regional wall  motion abnormalities. The left ventricular internal cavity size was mildly  dilated. There is mild left ventricular hypertrophy. Left ventricular  diastolic parameters are  indeterminate.   Right Ventricle: The right ventricular size is normal. Right vetricular  wall thickness was not assessed. Right ventricular systolic function is  normal. There is normal pulmonary artery systolic pressure. The tricuspid  regurgitant velocity is 2.41 m/s,  and with an assumed right atrial pressure of 10 mmHg, the estimated right  ventricular systolic pressure is 32.4 mmHg.   Left Atrium: Left atrial size was mildly dilated.   Right Atrium: Right atrial size was normal in size.   Pericardium: Trivial pericardial effusion is present.   Mitral Valve: MR is directed posteriorly into LA, likely due to tethering  of posterior leaflet. The mitral valve is grossly normal. Severe mitral  valve regurgitation, with posteriorly-directed jet.   Tricuspid Valve: The tricuspid valve is normal in structure. Tricuspid  valve regurgitation is mild.   Aortic Valve: The aortic valve is tricuspid.  Aortic valve regurgitation is  not visualized. Mild aortic valve sclerosis is present, with no evidence  of aortic valve stenosis.   Pulmonic Valve: The pulmonic valve was normal in structure. Pulmonic valve  regurgitation is not visualized.   Aorta: The aortic root and ascending aorta are structurally normal, with  no evidence of dilitation.   Venous: The inferior vena cava is normal in size with greater than 50%  respiratory variability, suggesting right atrial pressure of 3 mmHg.   IAS/Shunts: No atrial level shunt detected by color flow Doppler.    Cardiac cath and PCI 03/29/19 Diagnostic Dominance: Right  Intervention     Patient Profile     Mr. 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    Assessment & Plan    # Cardiogenic shock: # Possible septic shock # Severe MR:  Feeling a little bit better since starting milrinone.  CVP is 3 mmHg based on PICC, but he still has a lot of LE edema.  Albumin 1.9.  Renal function stable with diuresis but he is increasingly dyspneic and has not made much headway with diuresis.  We will try increasing Lasix to 80 mg twice daily.  Co-ox significantly better today in the 80s.Marland Kitchen  He continues to have significant lower extremity edema, though this is likely due to both heart failure and low albumin.  Continue midodrine and digoxin.  Maintain K>4, Mg>2.  He is getting potassium and will order magnesium.   # Persistent atrial flutter:  He is in and out of atrial flutter and there is no evidence that this is affecting his symptoms.  No plans to pursue repeat cardioversion at this time.  Continue IV  heparin, given his pancytopenia.  No bolus.  Continue to hold Xarelto given the concern that there may also be GI bleeding.  Continue to watch blood counts closely.  Resuming digoxin as above.  Resume Xarelto when platelets improve.  # CAD s/p multiple PCI: Most recent PCI 03/2019.  Oral antiplatelets on hold due to pancytopenia as above.  Continue IV heparin.  Continue to monitor blood counts closely and consider resuming as platelets and RBCs improve.  No beta blocker 2/2 shock.    # Diffuse large B-cell lymphoma: Actively being treated.  Had 2 cycles of R-CHOP.  Followed by Dr. Alen Blew.  Time spent: 35 minutes-Greater than 50% of this time was spent in counseling, explanation of diagnosis, planning of further management, and coordination of care.      For questions or updates, please contact [place] Please consult [email or web address] for contact info under        Signed, Skeet Latch, MD  07/11/2019, 11:22 AM

## 2019-07-11 NOTE — Progress Notes (Signed)
No major clinical changes noted at this time.  He had continues to have issues with fluid overload and currently receiving diuresis.  Laboratory data reviewed and showed recovery of his white cell count as expected with hemoglobin being stable.  Platelet count remains low which likely will recover the slowest.  No active bleeding noted and I have no objections to continuing heparin.  No need for any platelet transfusion.  We will continue to follow his labs and clinical status.

## 2019-07-11 NOTE — Progress Notes (Addendum)
NAME:  Tony Saunders, MRN:  [id number], DOB:  02/12/1955, LOS: 3 ADMISSION DATE:  07/08/2019, CONSULTATION DATE:  6/20 REFERRING MD:  Dr. Ashok Cordia, CHIEF COMPLAINT:  Weakness    Brief History   64 y/o M admitted 6/20 with reports of a 3 week history of diarrhea and progressive weakness.  Work up notable for pallor, febrile neutropenia, severe anemia (hgb 4.6) and heme positive stools (on xarelto).  The patient was recently admitted from 5/28-6/12 for hypotension, new AFwRVR s/p DCCV x2 (on amiodarone, xarelto, brilinta), pancytopenia requiring multiple transfusions.   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 States he has little appetite. Glucose range 93-134 Afebrile / WBC up to 3.8 I/O 1.8L UOP, -196 ml in last 24 hours  Objective   Blood pressure 118/64, pulse 92, temperature (!) 96.9 F (36.1 C), temperature source Axillary, resp. rate (!) 30, height 6' (1.829 m), weight 85.4 kg, SpO2 100 %. CVP:  [3 mmHg-10 mmHg] 3 mmHg  FiO2 (%):  [80 %] 80 %   Intake/Output Summary (Last 24 hours) at 07/11/2019 1002 Last data filed at 07/11/2019 0900 Gross per 24 hour  Intake 1599.92 ml   Output 1875 ml  Net -275.08 ml   Filed Weights   07/08/19 1403 07/10/19 0600 07/11/19 0500  Weight: 77.1 kg 83.3 kg 85.4 kg    Examination: General: chronically ill appearing adult male lying in bed in NAD HEENT: MM pink/moist, bipap mask in place  Neuro: AAOx4, speech clear, MAE, generalized weakness CV: s1s2 rrr, no m/r/g PULM: mild tachypnea, non-labored on bipap, crackles / rhonchi bilaterally  GI: soft, bsx4 active  Extremities: warm/dry, improved BLE pitting edema, 2+ Skin: thin skin, multiple areas of ecchymosis    Resolved Hospital Problem list      Assessment & Plan:   Neutropenic Fever  Pancytopenia  Rule out opportunistic infection, no clear source sepsis  -appreciate HEME / ONC input  -per notes, anticipate platelets to be last to respond / rebound -follow CBC  Shock   Suspect element of hypovolemic with profound anemia, +/- septic, with additional decompensated systolic CHF  -continue stress dose steroids  -D3 abx -follow co-ox  -cultures as above  -continue home midodrine   Acute Decompensated Systolic CHF  Hx AF, CAD, HLD, Chronic Combined Systolic / Diastolic CHF  On midodrine at baseline. SvO2 48 on 6/21, LVEF 35-40%  -continue home midodrine, amiodarone, digoxin -brilinta, xarelto on hold  -heparin gtt per pharmacy / Cardiology  -appreciate Cardiology assistance with patient care -continue milrinone gtt -follow I/O's  -lasix 40 mg BID   Severe Anemia  Rule out  GI bleed, FOBT positive on admit  -trend CBC  -PPI  -heparin per Cardiology / [place] -monitor for bleeding  -may need GI evaluation at home point given positive FOBT on admit, anemia. Currently not a candidate for procedure  Interstitial Edema  Acute Hypoxemic Respiratory Failure  Former Smoker -O2 to support sats >90% -lasix as above  -follow intermittent CXR  -pt would accept intubation if needed  Hypokalemia  Hypomagnesemia  -follow electrolytes, replace as indicated    Mild Troponin Leak  Suspect in setting of profound anemia, demand. EKG without ischemic changes.  -tele monitoring   Diffuse Large Cell Lymphoma  Extensive disease involvement, s/p 2 cycles of chemotherapy with CHOP/rituximab, last on 6/11.  Thought per ONC to be a potentially treatable condition.   -Per ONC  -follow CBC closely  Pressure Ulcer  Sacral pressure ulcer, present on admit -wound care per protocol  -pressure relieving measures > OOB to chair if able   Hx Seizure  -continue lamictal, keppra   Severe Protein Calorie Malnutrition  -liberalize diet for now, pt with limited oral intake.  At this point, we can manage his volume status and allow him to eat and malnutrition could be life threatening / limit his recovery   Best practice:  Diet: Regular diet  Pain/Anxiety/Delirium protocol (if indicated): n/a  VAP protocol (if indicated): n/a  DVT prophylaxis: SCD's  GI prophylaxis: PPI Glucose control: n/a  Mobility: as tolerated  Code Status: Full Code, pt would want intubation, CPR if necessary.  Would not want long term life support if no reasonable chance of recovery.  Family Communication: Patient and wife updated on plan of care Disposition: ICU   Labs   CBC: Recent Labs  Lab 07/05/19 1151 07/08/19 1416 07/09/19 1144 07/10/19 0634 07/11/19 0514  WBC 6.9 0.1* 1.0* 2.3* 3.8*  NEUTROABS  --  0.1*  --   --   --   HGB 7.5* 4.6* 8.9* 8.6* 8.0*  HCT 24.7* 14.6* 27.3* 26.3* 24.8*  MCV 89.2 85.4 87.5 85.7 87.6  PLT 123* 28* 29* 28* 25*    Basic Metabolic Panel: Recent Labs  Lab 07/05/19 1151 07/08/19 1416 07/08/19 1515 07/09/19 1144 07/10/19 0634 07/11/19 0514  NA 138 137  --  136 137 137  K 4.3 3.1*  --  3.2* 2.8* 2.5*  CL 105 104  --  104 101 102  CO2 27 25  --  23 25 26   GLUCOSE 87 93  --  134* 108* 128*  BUN 17 15  --  18 20 21   CREATININE 0.83 0.90  --  0.81 0.91 0.89  CALCIUM 8.3* 7.6*  --  7.2* 7.0* 6.6*  MG  --   --  1.4*  --   --   --     GFR: Estimated Creatinine Clearance: 94.5 mL/min (by C-G formula based on SCr of 0.89 mg/dL). Recent Labs  Lab 07/05/19 1151 07/08/19 1415 07/08/19 1416 07/08/19 1552 07/08/19 1709 07/09/19 1144 07/10/19 0634 07/11/19 0514  PROCALCITON  --   --   --   --  1.15  --   --   --   WBC   < >  --  0.1*  --   --  1.0* 2.3* 3.8*  LATICACIDVEN  --  2.7*  --  0.7  --   --   --   --    < > = values in this interval not displayed.    Liver Function Tests: Recent Labs  Lab  07/05/19 1151 07/08/19 1416 07/10/19 0634  AST 20 23 17   ALT 28 23 24   ALKPHOS 67 44 50  BILITOT 0.7 0.8 1.0  PROT 4.3* 4.1* 4.2*  ALBUMIN 2.3* 2.0* 1.9*   No results for input(s): LIPASE, AMYLASE in the last 168 hours. No results for input(s): AMMONIA in the last 168 hours.  ABG    Component Value Date/Time   HCO3 22.0 03/29/2019 1012   TCO2 23 03/29/2019 1012   ACIDBASEDEF 2.0 03/29/2019 1012   O2SAT 87.6 07/11/2019 0514     Coagulation Profile: Recent Labs  Lab 07/08/19 1416 07/10/19 1258  INR 1.8* 1.2    Cardiac Enzymes: No results for input(s): CKTOTAL, CKMB, CKMBINDEX, TROPONINI in the last 168 hours.  HbA1C: Hgb A1c MFr Bld  Date/Time Value Ref Range Status  03/27/2019 07:06 AM 5.3 4.8 - 5.6 % Final    Comment:    (NOTE) Pre diabetes:          5.7%-6.4% Diabetes:              >6.4% Glycemic control for   <7.0% adults with diabetes     CBG: No results for input(s): GLUCAP in the last 168 hours.   Critical care time: 73 minutes       Noe Gens, MSN, NP-C [facility] 07/11/2019, 10:02 AM   Please see [email or web address] for pager details.

## 2019-07-12 LAB — BASIC METABOLIC PANEL
Anion gap: 9 (ref 5–15)
Anion gap: 11 (ref 5–15)
BUN: 25 mg/dL — ABNORMAL HIGH (ref 8–23)
BUN: 24 mg/dL — ABNORMAL HIGH (ref 8–23)
CO2: 29 mmol/L (ref 22–32)
CO2: 29 mmol/L (ref 22–32)
Calcium: 6.5 mg/dL — ABNORMAL LOW (ref 8.9–10.3)
Calcium: 6.5 mg/dL — ABNORMAL LOW (ref 8.9–10.3)
Chloride: 100 mmol/L (ref 98–111)
Chloride: 99 mmol/L (ref 98–111)
Creatinine, Ser: 1.03 mg/dL (ref 0.61–1.24)
Creatinine, Ser: 1.03 mg/dL (ref 0.61–1.24)
GFR calc Af Amer: >60 mL/min (ref >60)
GFR calc Af Amer: >60 mL/min (ref >60)
GFR calc non Af Amer: >60 mL/min (ref >60)
GFR calc non Af Amer: >60 mL/min (ref >60)
Glucose, Bld: 138 mg/dL — ABNORMAL HIGH (ref 70–99)
Glucose, Bld: 185 mg/dL — ABNORMAL HIGH (ref 70–99)
Potassium: <2 mmol/L — CL (ref 3.5–5.1)
Potassium: 2.1 mmol/L — CL (ref 3.5–5.1)
Sodium: 138 mmol/L (ref 135–145)
Sodium: 139 mmol/L (ref 135–145)

## 2019-07-12 LAB — COOXEMETRY PANEL
Carboxyhemoglobin: 1.1 % (ref 0.5–1.5)
Methemoglobin: 0.7 % (ref 0.0–1.5)
O2 Saturation: 89.9 %
Total hemoglobin: 8.1 g/dL — ABNORMAL LOW (ref 12.0–16.0)

## 2019-07-12 LAB — CBC
HCT: 25.2 % — ABNORMAL LOW (ref 39.0–52.0)
Hemoglobin: 8.2 g/dL — ABNORMAL LOW (ref 13.0–17.0)
MCH: 28.1 pg (ref 26.0–34.0)
MCHC: 32.5 g/dL (ref 30.0–36.0)
MCV: 86.3 fL (ref 80.0–100.0)
Platelets: 41 10*3/uL — ABNORMAL LOW (ref 150–400)
RBC: 2.92 MIL/uL — ABNORMAL LOW (ref 4.22–5.81)
RDW: 17.3 % — ABNORMAL HIGH (ref 11.5–15.5)
WBC: 4.7 10*3/uL (ref 4.0–10.5)
nRBC: 0 % (ref 0.0–0.2)

## 2019-07-12 LAB — HEPARIN LEVEL (UNFRACTIONATED)
Heparin Unfractionated: 0.48 IU/mL (ref 0.30–0.70)
Heparin Unfractionated: 0.81 IU/mL — ABNORMAL HIGH (ref 0.30–0.70)
Heparin Unfractionated: 0.17 IU/mL — ABNORMAL LOW (ref 0.30–0.70)

## 2019-07-12 LAB — MAGNESIUM: Magnesium: 1.8 mg/dL (ref 1.7–2.4)

## 2019-07-12 MED ORDER — ARFORMOTEROL TARTRATE 15 MCG/2ML IN NEBU
15.0000 ug | INHALATION_SOLUTION | Freq: Two times a day (BID) | RESPIRATORY_TRACT | Status: DC
  Administered 2019-07-12 – 2019-07-14 (×5): 15 ug via RESPIRATORY_TRACT
  Filled 2019-07-12 (×4): qty 2

## 2019-07-12 MED ORDER — POTASSIUM CHLORIDE CRYS ER 20 MEQ PO TBCR
40.0000 meq | EXTENDED_RELEASE_TABLET | Freq: Two times a day (BID) | ORAL | Status: DC
  Administered 2019-07-13: 40 meq via ORAL
  Filled 2019-07-12: qty 2

## 2019-07-12 MED ORDER — MAGNESIUM SULFATE IN D5W 1-5 GM/100ML-% IV SOLN
1.0000 g | Freq: Once | INTRAVENOUS | Status: AC
  Administered 2019-07-12: 1 g via INTRAVENOUS
  Filled 2019-07-12: qty 100

## 2019-07-12 MED ORDER — POTASSIUM CHLORIDE CRYS ER 20 MEQ PO TBCR: 40.0000 meq | EXTENDED_RELEASE_TABLET | Freq: Two times a day (BID) | ORAL | Status: DC

## 2019-07-12 MED ORDER — BUDESONIDE 0.5 MG/2ML IN SUSP
0.5000 mg | Freq: Two times a day (BID) | RESPIRATORY_TRACT | Status: DC
  Administered 2019-07-12 – 2019-07-25 (×27): 0.5 mg via RESPIRATORY_TRACT
  Filled 2019-07-12 (×26): qty 2

## 2019-07-12 MED ORDER — POTASSIUM CHLORIDE CRYS ER 20 MEQ PO TBCR
40.0000 meq | EXTENDED_RELEASE_TABLET | ORAL | Status: AC
  Administered 2019-07-12 (×2): 40 meq via ORAL
  Filled 2019-07-12 (×2): qty 2

## 2019-07-12 MED ORDER — CALCIUM GLUCONATE-NACL 1-0.675 GM/50ML-% IV SOLN
1.0000 g | Freq: Once | INTRAVENOUS | Status: AC
  Administered 2019-07-12: 1000 mg via INTRAVENOUS
  Filled 2019-07-12: qty 50

## 2019-07-12 MED ORDER — HEPARIN (PORCINE) 25000 UT/250ML-% IV SOLN
1500.0000 [IU]/h | INTRAVENOUS | Status: DC
  Administered 2019-07-12: 1500 [IU]/h via INTRAVENOUS
  Filled 2019-07-12: qty 250

## 2019-07-12 MED ORDER — MAGNESIUM SULFATE 2 GM/50ML IV SOLN
2.0000 g | Freq: Once | INTRAVENOUS | Status: AC
  Administered 2019-07-12: 2 g via INTRAVENOUS
  Filled 2019-07-12: qty 50

## 2019-07-12 MED ORDER — POTASSIUM CHLORIDE 10 MEQ/50ML IV SOLN
10.0000 meq | INTRAVENOUS | Status: AC
  Administered 2019-07-12 (×4): 10 meq via INTRAVENOUS
  Filled 2019-07-12 (×4): qty 50

## 2019-07-12 MED ORDER — POTASSIUM CHLORIDE CRYS ER 20 MEQ PO TBCR
40.0000 meq | EXTENDED_RELEASE_TABLET | ORAL | Status: AC
  Administered 2019-07-12 (×3): 40 meq via ORAL
  Filled 2019-07-12 (×3): qty 2

## 2019-07-12 NOTE — Progress Notes (Signed)
NAME:  Tony Saunders, MRN:  [id number], DOB:  12-03-1955, LOS: 4 ADMISSION DATE:  07/08/2019, CONSULTATION DATE:  6/20 REFERRING MD:  Dr. Ashok Cordia, CHIEF COMPLAINT:  Weakness    Brief History   64 y/o M admitted 6/20 with reports of a 3 week history of diarrhea and progressive weakness.  Work up notable for pallor, febrile neutropenia, severe anemia (hgb 4.6) and heme positive stools (on xarelto).  The patient was recently admitted from 5/28-6/12 for hypotension, new AFwRVR s/p DCCV x2 (on amiodarone, xarelto, brilinta), pancytopenia requiring multiple transfusions.   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   Significant Hospital Events   6/20 Admit  6/21 Resp distress, BiPAP, lasix for pulm edema / vol overload from PRBC, SvO2 48 > milrinone  6/23 Heparin gtt initiated, on milrinone   Consults:  Cardiology   Procedures:  RUE Dual Lumen PICC 5/18 >>   Significant Diagnostic Tests:    Micro Data:  COVID 6/20 >> negative  BCx2 6/20 >>  UC 6/20 >> negative   Antimicrobials:  Flagyl 6/20 >> 6/24 Vanco 6/20 >> 6/24 Cefepime 6/20 >> 6/24  Interim history/subjective:  Pt reports dyspnea when lying flat, uses bipap with bathing which helps Afebrile / WBC up to 4.7, cultures remain negative on D4 Glucose stable  I/O 5.9L UOP, -4L in last 24 hours   Objective   Blood pressure (!) 101/59, pulse 94, temperature (!) 97.5 F (36.4 C), temperature source Oral, resp. rate (!) 21, height 6' (1.829 m), weight 81.8 kg, SpO2 97 %. CVP:  [3 mmHg-9 mmHg] 5 mmHg  FiO2 (%):  [50 %] 50 %   Intake/Output Summary (Last 24 hours) at 07/12/2019 1040 Last data filed at 07/12/2019 1019 Gross per 24 hour  Intake 1864.04 ml    Output 7600 ml  Net -5735.96 ml   Filed Weights   07/10/19 0600 07/11/19 0500 07/12/19 0500  Weight: 83.3 kg 85.4 kg 81.8 kg    Examination: General: chronically ill appearing adult male lying in bed in NAD HEENT: MM pink/moist, anicteric Neuro: AAOx4, speech clear, MAE / generalized weakness  CV: s1s2 rrr, no m/r/g PULM: non-labored on [place] O2, lungs bilaterally with wheezing, crackles GI: soft, bsx4 active  Extremities: warm/dry, 2+ BLE pitting edema (improving but still edematous)  Skin: thin skin, scattered areas of ecchymosis  Resolved Hospital Problem list      Assessment & Plan:   Neutropenic Fever  Pancytopenia  Rule out opportunistic infection, no clear source sepsis  -appreciate Heme/Onc involvement  -per ONC notes, anticipate platelets to be the last to recover  -follow CBC  -D4 abx, reviewed with pharmacy, will stop abx and monitor closely after 6/24 dosing   Shock   Suspect element of hypovolemic with profound anemia, +/- septic, with additional decompensated systolic CHF  -continue stress dose steroids  -D4 abx  -follow SvO2 trend, Cards plan to stop milrinone once diuresed  -abx as above  -continue home midodrine  Acute Decompensated Systolic CHF  Hx AF, CAD s/p PCI, HLD, Chronic Combined Systolic / Diastolic CHF  On midodrine at baseline. SvO2 48 on 6/21, LVEF 35-40%  -continue midodrine, amiodarone, digoxin  -brilinta, xarelto on hold, defer timing of restart to Cardiology /  HEME.  Plan is to restart Xarelto, plavix once platelets >50 if ok with Heme/Onc -continue milrinone gtt  -follow I/O's  -lasix on hold this am with severe hypokalemia -heparin gtt per pharmacy   Severe Anemia  Rule out GI bleed, FOBT positive on admit  -trend CBC -PPI  -heparin gtt per Cardiology / [place]  -monitor closely for bleeding  -may need GI evaluation at some point given FOBT + on admit.  Currently not a candidate for procedure  Interstitial Edema  Acute Hypoxemic  Respiratory Failure  Former Smoker -O2 as needed to support sats >90% -lasix on hold as above  -follow intermittent CXR  -pulmonary hygiene -IS, mobilize  -add brovana, pulmicort with wheezing   Hypokalemia  Hypomagnesemia  -added scheduled KCL, 40 mEq BID.  -follow up BMP at 1400  -pending review of above, Cardiology may restart lasix.  If not restarted, may need to hold scheduled K.  Will follow   Mild Troponin Leak  Suspect in setting of profound anemia, demand. EKG without ischemic changes.  -tele monitoring   Diffuse Large Cell Lymphoma  Extensive disease involvement, s/p 2 cycles of chemotherapy with CHOP/rituximab, last on 6/11.  Thought per ONC to be a potentially treatable condition.   -Per ONC  -follow CBC   Pressure Ulcer  Sacral pressure ulcer, present on admit -wound care per protocol  -mobilize   Hx Seizure  -continue home lamictal, keppra   Severe Protein Calorie Malnutrition  -liberalize diet as poor intake.  Once improved, can return to heart healthy diet  Best practice:  Diet: Regular diet  Pain/Anxiety/Delirium protocol (if indicated): n/a  VAP protocol (if indicated): n/a  DVT prophylaxis: SCD's  GI prophylaxis: PPI Glucose control: n/a  Mobility: as tolerated  Code Status: Full Code, pt would want intubation, CPR if necessary.  Would not want long term life support if no reasonable chance of recovery.  Family Communication: Patient updated on plan of care 6/24 Disposition: ICU   Labs   CBC: Recent Labs  Lab 07/08/19 1416 07/09/19 1144 07/10/19 0634 07/11/19 0514 07/12/19 0404  WBC 0.1* 1.0* 2.3* 3.8* 4.7  NEUTROABS 0.1*  --   --   --   --   HGB 4.6* 8.9* 8.6* 8.0* 8.2*  HCT 14.6* 27.3* 26.3* 24.8* 25.2*  MCV 85.4 87.5 85.7 87.6 86.3  PLT 28* 29* 28* 25* 41*    Basic Metabolic Panel: Recent Labs  Lab 07/08/19 1416 07/08/19 1515 07/09/19 1144 07/10/19 0634 07/11/19 0514 07/12/19 0406  NA 137  --  136 137 137 138  K 3.1*  --   3.2* 2.8* 2.5* <2.0*  CL 104  --  104 101 102 100  CO2 25  --  23 25 26 29   GLUCOSE 93  --  134* 108* 128* 138*  BUN 15  --  18 20 21  25*  CREATININE 0.90  --  0.81 0.91 0.89 1.03  CALCIUM 7.6*  --  7.2* 7.0* 6.6* 6.5*  MG  --  1.4*  --   --   --   --    GFR: Estimated Creatinine Clearance: 81.6 mL/min (by C-G formula based on SCr of 1.03 mg/dL). Recent Labs  Lab 07/08/19 1415 07/08/19 1416 07/08/19 1552 07/08/19 1709 07/09/19 1144 07/10/19 0634 07/11/19 0514 07/12/19 0404  PROCALCITON  --   --   --  1.15  --   --   --   --   WBC  --    < >  --   --  1.0* 2.3* 3.8* 4.7  LATICACIDVEN 2.7*  --  0.7  --   --   --   --   --    < > = values in this interval not displayed.    Liver Function Tests: Recent Labs  Lab 07/05/19 1151 07/08/19 1416 07/10/19 0634  AST 20 23 17   ALT 28 23 24   ALKPHOS 67 44 50  BILITOT 0.7 0.8 1.0  PROT 4.3* 4.1* 4.2*  ALBUMIN 2.3* 2.0* 1.9*   No results for input(s): LIPASE, AMYLASE in the last 168 hours. No results for input(s): AMMONIA in the last 168 hours.  ABG    Component Value Date/Time   HCO3 22.0 03/29/2019 1012   TCO2 23 03/29/2019 1012   ACIDBASEDEF 2.0 03/29/2019 1012   O2SAT 89.9 07/12/2019 0404     Coagulation Profile: Recent Labs  Lab 07/08/19 1416 07/10/19 1258  INR 1.8* 1.2    Cardiac Enzymes: No results for input(s): CKTOTAL, CKMB, CKMBINDEX, TROPONINI in the last 168 hours.  HbA1C: Hgb A1c MFr Bld  Date/Time Value Ref Range Status  03/27/2019 07:06 AM 5.3 4.8 - 5.6 % Final    Comment:    (NOTE) Pre diabetes:          5.7%-6.4% Diabetes:              >6.4% Glycemic control for   <7.0% adults with diabetes     CBG: No results for input(s): GLUCAP in the last 168 hours.   Critical care time: 28 minutes       Noe Gens, MSN, NP-C [facility] 07/12/2019, 10:40 AM   Please see [email or web address] for pager details.

## 2019-07-12 NOTE — Progress Notes (Signed)
Progress Note  Patient Name: Tony Saunders Date of Encounter: 07/12/2019  [facility] Cardiologist: Glenetta Hew, MD   Subjective   Feeling better.  Breathing is improving.  Happy that his blood counts are stabilizing.   Inpatient Medications    Scheduled Meds: . amiodarone  200 mg Oral Daily  . chlorhexidine  15 mL Mouth Rinse BID  . Chlorhexidine Gluconate Cloth  6 each Topical Daily  . digoxin  0.125 mg Oral Daily  . feeding supplement  1 Container Oral BID BM  . feeding supplement (PRO-STAT SUGAR FREE 64)  30 mL Oral TID BM  . hydrocortisone sodium succinate  100 mg Intravenous Q8H  . lamoTRIgine  100 mg Oral BID  . levETIRAcetam  750 mg Oral BID  . mouth rinse  15 mL Mouth Rinse q12n4p  . midodrine  5 mg Oral TID WC  . mirtazapine  15 mg Oral QHS  . multivitamin with minerals  1 tablet Oral Daily  . pantoprazole (PROTONIX) IV  40 mg Intravenous Q24H  . potassium chloride  40 mEq Oral Q4H  . sertraline  25 mg Oral Daily  . sodium chloride flush  10-40 mL Intracatheter Q12H   Continuous Infusions: . ceFEPime (MAXIPIME) IV 2 g (07/12/19 0927)  . heparin 1,450 Units/hr (07/12/19 0920)  . metronidazole Stopped (07/12/19 4098)  . milrinone 0.25 mcg/kg/min (07/12/19 0853)  . potassium chloride 10 mEq (07/12/19 0926)  . vancomycin Stopped (07/12/19 0531)   PRN Meds: docusate sodium, ipratropium, levalbuterol, polyethylene glycol, sodium chloride flush   Vital Signs    Vitals:   07/12/19 0200 07/12/19 0400 07/12/19 0500 07/12/19 0800  BP: (!) 115/59 120/67  (!) 101/59  Pulse: 76 94  86  Resp: (!) 22 (!) 26  (!) 21  Temp:  (!) 97.4 F (36.3 C)  (!) 97.5 F (36.4 C)  TempSrc:  Oral  Oral  SpO2: 95% 95%  97%  Weight:   81.8 kg   Height:        Intake/Output Summary (Last 24 hours) at 07/12/2019 0940 Last data filed at 07/12/2019 0853 Gross per 24 hour  Intake 1914.04 ml  Output 5925 ml  Net -4010.96 ml   Last 3 Weights 07/12/2019 07/11/2019  07/10/2019  Weight (lbs) 180 lb 5.4 oz 188 lb 4.4 oz 183 lb 10.3 oz  Weight (kg) 81.8 kg 85.4 kg 83.3 kg      Telemetry    Sinus rhythm.  First degree AV block.  11 beats NSVT.  PVCs.   - Personally Reviewed  ECG    n/a - Personally Reviewed  Physical Exam   CVP 5 VS:  BP (!) 101/59 (BP Location: Left Arm)   Pulse 86   Temp (!) 97.5 F (36.4 C) (Oral)   Resp (!) 21   Ht 6' (1.829 m)   Wt 81.8 kg   SpO2 97%   BMI 24.46 kg/m  , BMI Body mass index is 24.46 kg/m. GENERAL: Chronically ill-appearing HEENT: Pupils equal round and reactive, fundi not visualized, oral mucosa unremarkable NECK:  No jugular venous distention, waveform within normal limits, carotid upstroke brisk and symmetric, no bruits LUNGS:  Rhonchi throughout.  No crackles HEART:  RRR.  Difficult to auscultate 2/2 coarse rhonchi ABD:  Flat, positive bowel sounds normal in frequency in pitch, no bruits, no rebound, no guarding, no midline pulsatile mass, no hepatomegaly, no splenomegaly EXT:  2 plus pulses throughout, 2+ pitting edema to upper tibia bilaterally-improving, no cyanosis no  clubbing SKIN:  Multiple ecchymoses NEURO:  Cranial nerves II through XII grossly intact, motor grossly intact throughout PSYCH:  Cognitively intact, oriented to person place and time   Labs    High Sensitivity Troponin:   Recent Labs  Lab 07/08/19 1416 07/08/19 1558  TROPONINIHS 96* 96*      Chemistry Recent Labs  Lab 07/05/19 1151 07/05/19 1151 07/08/19 1416 07/09/19 1144 07/10/19 0634 07/11/19 0514 07/12/19 0406  NA 138   < > 137   < > 137 137 138  K 4.3   < > 3.1*   < > 2.8* 2.5* <2.0*  CL 105   < > 104   < > 101 102 100  CO2 27   < > 25   < > 25 26 29   GLUCOSE 87   < > 93   < > 108* 128* 138*  BUN 17   < > 15   < > 20 21 25*  CREATININE 0.83   < > 0.90   < > 0.91 0.89 1.03  CALCIUM 8.3*   < > 7.6*   < > 7.0* 6.6* 6.5*  PROT 4.3*  --  4.1*  --  4.2*  --   --   ALBUMIN 2.3*  --  2.0*  --  1.9*  --   --     AST 20  --  23  --  17  --   --   ALT 28  --  23  --  24  --   --   ALKPHOS 67  --  44  --  50  --   --   BILITOT 0.7  --  0.8  --  1.0  --   --   GFRNONAA >60   < > >60   < > >60 >60 >60  GFRAA >60   < > >60   < > >60 >60 >60  ANIONGAP 6   < > 8   < > 11 9 9    < > = values in this interval not displayed.     Hematology Recent Labs  Lab 07/10/19 0634 07/11/19 0514 07/12/19 0404  WBC 2.3* 3.8* 4.7  RBC 3.07* 2.83* 2.92*  HGB 8.6* 8.0* 8.2*  HCT 26.3* 24.8* 25.2*  MCV 85.7 87.6 86.3  MCH 28.0 28.3 28.1  MCHC 32.7 32.3 32.5  RDW 17.3* 17.4* 17.3*  PLT 28* 25* 41*    BNP Recent Labs  Lab 07/05/19 1151 07/08/19 1417  BNP 404.8* 597.3*     DDimer No results for input(s): DDIMER in the last 168 hours.   Radiology    DG CHEST PORT 1 VIEW  Result Date: 07/11/2019 CLINICAL DATA:  Respiratory failure. EXAM: PORTABLE CHEST 1 VIEW COMPARISON:  Single-view of the chest 07/08/2019. FINDINGS: Right PICC is unchanged. Diffuse bilateral airspace disease is much worse than on the prior study. There are likely small to moderate bilateral pleural effusions. No pneumothorax. Heart size is normal. IMPRESSION: Marked worsening diffuse bilateral airspace disease consistent with progressive pneumonia and/or pulmonary edema. Electronically Signed   By: Inge Rise M.D.   On: 07/11/2019 08:55    Cardiac Studies   TTE 05/14/19   1. Compared to echo in March 2021, no significant change in LVEF or MR.  Pt is now in atrial fibrillation.  2. LVEF is severely depressed with severe hypokinesis/akinesis of the  base/mid inferior, inferosepal, infer walls and apical walls; hypokinesis  elsewhere . Left ventricular ejection fraction, by estimation,  is 30%. The  left ventricle has severely  decreased function. The left ventricular internal cavity size was mildly  dilated. There is mild left ventricular hypertrophy. Left ventricular  diastolic parameters are indeterminate.  3. Right  ventricular systolic function is normal. The right ventricular  size is normal. There is normal pulmonary artery systolic pressure.  4. Left atrial size was mildly dilated.  5. MR is directed posteriorly into LA, likely due to tethering of  posterior leaflet. Severe mitral valve regurgitation.  6. The aortic valve is tricuspid. Aortic valve regurgitation is not  visualized. Mild aortic valve sclerosis is present, with no evidence of  aortic valve stenosis.  7. The inferior vena cava is normal in size with greater than 50%  respiratory variability, suggesting right atrial pressure of 3 mmHg.   FINDINGS  Left Ventricle: LVEF is severely depressed with severe  hypokinesis/akinesis of the base/mid inferior, inferosepal, infer walls  and apical walls; hypokinesis elsewhere. Left ventricular ejection  fraction, by estimation, is 30%%. The left ventricle has  severely decreased function. The left ventricle demonstrates regional wall  motion abnormalities. The left ventricular internal cavity size was mildly  dilated. There is mild left ventricular hypertrophy. Left ventricular  diastolic parameters are  indeterminate.   Right Ventricle: The right ventricular size is normal. Right vetricular  wall thickness was not assessed. Right ventricular systolic function is  normal. There is normal pulmonary artery systolic pressure. The tricuspid  regurgitant velocity is 2.41 m/s,  and with an assumed right atrial pressure of 10 mmHg, the estimated right  ventricular systolic pressure is 27.0 mmHg.   Left Atrium: Left atrial size was mildly dilated.   Right Atrium: Right atrial size was normal in size.   Pericardium: Trivial pericardial effusion is present.   Mitral Valve: MR is directed posteriorly into LA, likely due to tethering  of posterior leaflet. The mitral valve is grossly normal. Severe mitral  valve regurgitation, with posteriorly-directed jet.   Tricuspid Valve: The tricuspid  valve is normal in structure. Tricuspid  valve regurgitation is mild.   Aortic Valve: The aortic valve is tricuspid. Aortic valve regurgitation is  not visualized. Mild aortic valve sclerosis is present, with no evidence  of aortic valve stenosis.   Pulmonic Valve: The pulmonic valve was normal in structure. Pulmonic valve  regurgitation is not visualized.   Aorta: The aortic root and ascending aorta are structurally normal, with  no evidence of dilitation.   Venous: The inferior vena cava is normal in size with greater than 50%  respiratory variability, suggesting right atrial pressure of 3 mmHg.   IAS/Shunts: No atrial level shunt detected by color flow Doppler.    Cardiac cath and PCI 03/29/19 Diagnostic Dominance: Right  Intervention     Patient Profile     Mr. Barish is a 26M with CAD s/p recent PCI in all three coronary distributions (last 03/2019), persistent atrial fib/flutter s/p multiple DCCV (last 06/20/35), chronic systolic and diastolic heart failure (LVEF 30-35%), moderate-severe MR, and recently diagnosed diffuse large cell lymphoma s/p 2 cycles of R-CHOP admitted with weakness, pancytopenia/neutropenia, and acute on chronic heart failure.    Assessment & Plan    # Cardiogenic shock: # Possible septic shock # Severe MR:  Feeling better since starting milrinone.  CVP is 5 mmHg based on PICC, but he still has a lot of LE edema.  Albumin 1.9.  Renal function stable with diuresis.  He was net -4L yesterday.  Will continue with lasix 80mg   IV bid.  However, K is <2.  He is being aggressively repleted.  Check BMP at 2pm.  If renal function stable and potassium improved, continue lasix.  Co-ox stable in the 80s.Marland Kitchen  He continues to have significant lower extremity edema, though this is likely due to both heart failure and low albumin.  Continue midodrine and digoxin.  Will wean milrinone one he is diuresed.  Maintain K>4, Mg>2.   # Persistent atrial flutter:  He has been  in and out of atrial flutter and there is no evidence that this is affecting his symptoms.  No plans to pursue repeat cardioversion at this time.  Continue IV heparin, given his pancytopenia. H/h stable and platelets improving.  Plan to resume Johnella Moloney once platelets >50 if OK with the oncology team. Continue digoxin.  # CAD s/p multiple PCI: Most recent PCI 03/2019.  Oral antiplatelets on hold due to pancytopenia as above.  Continue IV heparin.  Resume clopidogrel when platelets >50 if OK with Oncology.  No beta blocker 2/2 shock.    # Diffuse large B-cell lymphoma: Actively being treated.  Had 2 cycles of R-CHOP.  Followed by Dr. Alen Blew.  Time spent: 40 minutes-Greater than 50% of this time was spent in counseling, explanation of diagnosis, planning of further management, and coordination of care.      For questions or updates, please contact [place] Please consult [email or web address] for contact info under        Signed, Skeet Latch, MD  07/12/2019, 9:40 AM

## 2019-07-12 NOTE — Progress Notes (Signed)
CRITICAL VALUE ALERT  Critical Value:  K+ 2.1   Date & Time Notied:  07/12/19 1539 Provider Notified: Noe Gens, NP   Orders Received/Actions taken: Awaiting new orders

## 2019-07-12 NOTE — Progress Notes (Signed)
Pharmacy Brief Note - Anticoagulation Follow Up:  Pt is a 7 yoM currently on heparin drip for atrial fibrillation while rivaroxaban is on hold.   Assessment:  HL = 0.25 only slightly subtherapeutic on heparin infusion of 1450 units/hr.   Confirmed with RN that heparin infusing at correct rate. No bleeding.   Goal: HL 0.3-05 (reduced goal given anemia and thrombocytopenia)  Plan:  No bolus due to thrombocytopenia and anemia per MD  Increase heparin infusion to 1500 units/hr  Check HL in 6 hrs  CBC, HL daily while on heparin  Monitor for signs of bleeding  Lenis Noon, PharmD 07/12/19 12:22 AM

## 2019-07-12 NOTE — Progress Notes (Signed)
CRITICAL VALUE ALERT  Critical Value:  Potassium= less than 2  Date & Time Notied:  07/12/19; 5:00am  Provider Notified: E-link MD  Orders Received/Actions taken: Waiting for order

## 2019-07-12 NOTE — Progress Notes (Signed)
Pharmacy Brief Note  64 y/o M on XArelto for atrial fibrillation admitted with weakness currently on heparin drip with low anti-Xa goal due to severe anemia.   O: HL 0.81 >> 0.17  A: HL initially sura-therapeutic. Spoke to RN who reported drawing from central line. Lab subsequently collected a peripheral draw that is sub-therapeutic.   P: Increase heparin infusion to 1500 units/hr. Conservative increase due to low goal. Recheck HL with AM labs.   Ulice Dash, PharmD, BCPS

## 2019-07-12 NOTE — Progress Notes (Signed)
ANTICOAGULATION CONSULT NOTE - Follow Up Consult  Pharmacy Consult for Heparin Indication: atrial fibrillation  No Known Allergies  Patient Measurements: Height: 6' (182.9 cm) Weight: 81.8 kg (180 lb 5.4 oz) IBW/kg (Calculated) : 77.6 Heparin Dosing Weight: 85.4kg  Vital Signs: Temp: 97.5 F (36.4 C) (06/24 0800) Temp Source: Oral (06/24 0800) BP: 101/59 (06/24 0800) Pulse Rate: 86 (06/24 0800)  Labs: Recent Labs     0000 07/10/19 0634 07/10/19 1258 07/10/19 1949 07/11/19 0514 07/11/19 0552 07/11/19 1452 07/11/19 2241 07/12/19 0404 07/12/19 0406 07/12/19 0820  HGB   < > 8.6*  --   --  8.0*  --   --   --  8.2*  --   --   HCT  --  26.3*  --   --  24.8*  --   --   --  25.2*  --   --   PLT  --  28*  --   --  25*  --   --   --  41*  --   --   APTT  --   --  36  --   --   --   --   --   --   --   --   LABPROT  --   --  14.9  --   --   --   --   --   --   --   --   INR  --   --  1.2  --   --   --   --   --   --   --   --   HEPARINUNFRC  --   --  <0.10*   < >  --    < > 0.12* 0.25*  --   --  0.48  CREATININE  --  0.91  --   --  0.89  --   --   --   --  1.03  --    < > = values in this interval not displayed.    Estimated Creatinine Clearance: 81.6 mL/min (by C-G formula based on SCr of 1.03 mg/dL).   Medical History: Past Medical History:  Diagnosis Date  . 3vessel CAD- S/P PCI 03/27/2019   Remote pRCA PCI-stenting 2006. Acute MI 03/27/2019 treated with urgent m-d RCA PCI and stent (x 2) followed by staged PCI DES to CFX (x2 stents) and LAD (x1) on 03/29/2019  . Acute ST elevation myocardial infarction (STEMI) of inferior wall ([place]) 03/27/2019   Pt presented 03/27/2019 with an acute inferior MI- Cath 03/27/19 showed thrombotic occlusion of mid-distal RCA with a patent previously placed pRCA stent -2 overlapping Synergy DES from PDA back into proximal RCA stented segment and PTCA of RPAV (jailed)-PL 3 He also had concomitant high garde CFX and LAD disease (staged PCI) with an EF  of 30-35%  . Chronic combined systolic and diastolic CHF, NYHA class 2 and ACC/AHA stage C ([place]) 03/27/2019   EF 30 to 40% with diffuse inferior hypokinesis/akinesis; severe ischemic MR  . 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 Myocardial infarction [facility]) 2006   PCI of the RCA  . PAF (persistent-paroxysmal atrial fibrillation) ([place]) 04/16/2019   Brief- post MI  . Seizure, temporal lobe ([place]) 2017   most recent 07/23/15-on Keppra and Lamictal  . Severe mitral regurgitation by prior echocardiogram-likely ischemic with tethered posterior leaflet 03/31/2019   Repeat echo 4-6 weeks post MI  Medications:  Scheduled:  . amiodarone  200 mg Oral Daily  . chlorhexidine  15 mL Mouth Rinse BID  . Chlorhexidine Gluconate Cloth  6 each Topical Daily  . digoxin  0.125 mg Oral Daily  . feeding supplement  1 Container Oral BID BM  . feeding supplement (PRO-STAT SUGAR FREE 64)  30 mL Oral TID BM  . furosemide  80 mg Intravenous BID  . hydrocortisone sodium succinate  100 mg Intravenous Q8H  . lamoTRIgine  100 mg Oral BID  . levETIRAcetam  750 mg Oral BID  . mouth rinse  15 mL Mouth Rinse q12n4p  . midodrine  5 mg Oral TID WC  . mirtazapine  15 mg Oral QHS  . multivitamin with minerals  1 tablet Oral Daily  . pantoprazole (PROTONIX) IV  40 mg Intravenous Q24H  . potassium chloride  40 mEq Oral Q4H  . sertraline  25 mg Oral Daily  . sodium chloride flush  10-40 mL Intracatheter Q12H   Infusions:  . ceFEPime (MAXIPIME) IV Stopped (07/12/19 0230)  . heparin 1,500 Units/hr (07/12/19 0853)  . magnesium sulfate bolus IVPB 50 mL/hr at 07/12/19 0853  . metronidazole Stopped (07/12/19 3612)  . milrinone 0.25 mcg/kg/min (07/12/19 0853)  . potassium chloride 50 mL/hr at 07/12/19 0853  . vancomycin Stopped (07/12/19 0531)   PRN: docusate sodium, ipratropium, levalbuterol, polyethylene glycol, sodium chloride flush  Assessment: 64 yo male on xarelto for afib.   Currently on chemo for diffuse large B-cell lymphoma - rituximab / CHOP, last given 6/11.  Admitted 6/20 with weakness, Hgb 4.6 - transfused and xarelto held (last dose taken 6/19).  Pharmacy consulted to begin IV heparin - CCM, Cardiology and Oncology all aware of low Hgb and Plts.  07/12/2019  Heparin level 0.48 which is at upper end of goal and now rising quickly after multiple rate increases  Hgb 8.2 s/p transfusion 6/21; Plts low s/p chemo  No visible bleeding documented  Goal of Therapy:  Heparin level 0.3-0.5 units/ml Monitor platelets by anticoagulation protocol: Yes   Plan:   Decrease Heparin IV infusion slightly to 1450 units/hr  Check heparin level in 6hrs  Daily heparin level and CBC  Peggyann Juba, PharmD, BCPS Pharmacy: [phone] 07/12/2019,8:59 AM

## 2019-07-12 NOTE — Progress Notes (Signed)
[place] Progress Note Patient Name: Tony Saunders DOB: 1955/01/29 MRN: [id number]   Date of Service  07/12/2019  HPI/Events of Note  K+ < 2.0  eICU Interventions  Electrolyte replacement protocol for K+ ordered.        [name] Robyne Matar 07/12/2019, 5:07 AM

## 2019-07-12 NOTE — Progress Notes (Signed)
Pharmacist Chemotherapy Monitoring - Follow Up Assessment    I verify that I have reviewed each item in the below checklist:  . Regimen for the patient is scheduled for the appropriate day and plan matches scheduled date. Marland Kitchen Appropriate non-routine labs are ordered dependent on drug ordered. . If applicable, additional medications reviewed and ordered per protocol based on lifetime cumulative doses and/or treatment regimen.   Plan for follow-up and/or issues identified: Yes . I-vent associated with next due treatment: Yes . MD and/or nursing notified: No  Philomena Course 07/12/2019 11:28 AM

## 2019-07-12 NOTE — Progress Notes (Signed)
Pt currently off BIPAP and tolerating well at this time.  Pt on 15 LPM [place] with no noted distress, RT to monitor and assess as needed.

## 2019-07-13 ENCOUNTER — Inpatient Hospital Stay (HOSPITAL_COMMUNITY): Payer: No Typology Code available for payment source

## 2019-07-13 LAB — BASIC METABOLIC PANEL
Anion gap: 14 (ref 5–15)
Anion gap: 7 (ref 5–15)
Anion gap: 10 (ref 5–15)
BUN: 25 mg/dL — ABNORMAL HIGH (ref 8–23)
BUN: 25 mg/dL — ABNORMAL HIGH (ref 8–23)
BUN: 27 mg/dL — ABNORMAL HIGH (ref 8–23)
CO2: 25 mmol/L (ref 22–32)
CO2: 27 mmol/L (ref 22–32)
CO2: 27 mmol/L (ref 22–32)
Calcium: 7.2 mg/dL — ABNORMAL LOW (ref 8.9–10.3)
Calcium: 7 mg/dL — ABNORMAL LOW (ref 8.9–10.3)
Calcium: 7.1 mg/dL — ABNORMAL LOW (ref 8.9–10.3)
Chloride: 101 mmol/L (ref 98–111)
Chloride: 105 mmol/L (ref 98–111)
Chloride: 103 mmol/L (ref 98–111)
Creatinine, Ser: 1.06 mg/dL (ref 0.61–1.24)
Creatinine, Ser: 1.05 mg/dL (ref 0.61–1.24)
Creatinine, Ser: 0.98 mg/dL (ref 0.61–1.24)
GFR calc Af Amer: >60 mL/min (ref >60)
GFR calc Af Amer: >60 mL/min (ref >60)
GFR calc Af Amer: >60 mL/min (ref >60)
GFR calc non Af Amer: >60 mL/min (ref >60)
GFR calc non Af Amer: >60 mL/min (ref >60)
GFR calc non Af Amer: >60 mL/min (ref >60)
Glucose, Bld: 162 mg/dL — ABNORMAL HIGH (ref 70–99)
Glucose, Bld: 141 mg/dL — ABNORMAL HIGH (ref 70–99)
Glucose, Bld: 122 mg/dL — ABNORMAL HIGH (ref 70–99)
Potassium: 3.3 mmol/L — ABNORMAL LOW (ref 3.5–5.1)
Potassium: 7.5 mmol/L (ref 3.5–5.1)
Potassium: 3.8 mmol/L (ref 3.5–5.1)
Sodium: 140 mmol/L (ref 135–145)
Sodium: 139 mmol/L (ref 135–145)
Sodium: 140 mmol/L (ref 135–145)

## 2019-07-13 LAB — CBC
HCT: 32 % — ABNORMAL LOW (ref 39.0–52.0)
Hemoglobin: 10.1 g/dL — ABNORMAL LOW (ref 13.0–17.0)
MCH: 27.9 pg (ref 26.0–34.0)
MCHC: 31.6 g/dL (ref 30.0–36.0)
MCV: 88.4 fL (ref 80.0–100.0)
Platelets: 84 10*3/uL — ABNORMAL LOW (ref 150–400)
RBC: 3.62 MIL/uL — ABNORMAL LOW (ref 4.22–5.81)
RDW: 17.8 % — ABNORMAL HIGH (ref 11.5–15.5)
WBC: 15.8 10*3/uL — ABNORMAL HIGH (ref 4.0–10.5)
nRBC: 0.1 % (ref 0.0–0.2)

## 2019-07-13 LAB — COOXEMETRY PANEL
Carboxyhemoglobin: 1.4 % (ref 0.5–1.5)
Methemoglobin: 0.5 % (ref 0.0–1.5)
O2 Saturation: 74.6 %
Total hemoglobin: 10.5 g/dL — ABNORMAL LOW (ref 12.0–16.0)

## 2019-07-13 LAB — HEPARIN LEVEL (UNFRACTIONATED): Heparin Unfractionated: 0.77 IU/mL — ABNORMAL HIGH (ref 0.30–0.70)

## 2019-07-13 LAB — CULTURE, BLOOD (ROUTINE X 2)
Culture: NO GROWTH
Special Requests: ADEQUATE

## 2019-07-13 LAB — BRAIN NATRIURETIC PEPTIDE: B Natriuretic Peptide: 608 pg/mL — ABNORMAL HIGH (ref 0.0–100.0)

## 2019-07-13 LAB — PROCALCITONIN: Procalcitonin: 0.69 ng/mL

## 2019-07-13 LAB — MAGNESIUM: Magnesium: 2.1 mg/dL (ref 1.7–2.4)

## 2019-07-13 MED ORDER — SODIUM CHLORIDE 0.9 % IV SOLN
INTRAVENOUS | Status: DC | PRN
  Administered 2019-07-13 – 2019-07-19 (×4): 250 mL via INTRAVENOUS

## 2019-07-13 MED ORDER — VANCOMYCIN HCL 750 MG/150ML IV SOLN
750.0000 mg | Freq: Three times a day (TID) | INTRAVENOUS | Status: DC
  Administered 2019-07-13: 750 mg via INTRAVENOUS
  Filled 2019-07-13 (×2): qty 150

## 2019-07-13 MED ORDER — SODIUM CHLORIDE 0.9 % IV SOLN
2.0000 g | Freq: Three times a day (TID) | INTRAVENOUS | Status: DC
  Administered 2019-07-13 – 2019-07-16 (×10): 2 g via INTRAVENOUS
  Filled 2019-07-13 (×10): qty 2

## 2019-07-13 MED ORDER — DEXMEDETOMIDINE HCL IN NACL 200 MCG/50ML IV SOLN
0.2000 ug/kg/h | INTRAVENOUS | Status: DC
  Administered 2019-07-13: 0.4 ug/kg/h via INTRAVENOUS
  Administered 2019-07-13: 0.5 ug/kg/h via INTRAVENOUS
  Filled 2019-07-13 (×2): qty 50

## 2019-07-13 MED ORDER — RIVAROXABAN 20 MG PO TABS: 20.0000 mg | ORAL_TABLET | Freq: Every day | ORAL | Status: DC

## 2019-07-13 MED ORDER — METRONIDAZOLE IN NACL 5-0.79 MG/ML-% IV SOLN
500.0000 mg | Freq: Three times a day (TID) | INTRAVENOUS | Status: DC
  Administered 2019-07-13 – 2019-07-15 (×6): 500 mg via INTRAVENOUS
  Filled 2019-07-13 (×6): qty 100

## 2019-07-13 MED ORDER — VANCOMYCIN HCL IN DEXTROSE 1-5 GM/200ML-% IV SOLN
1000.0000 mg | Freq: Two times a day (BID) | INTRAVENOUS | Status: DC
  Administered 2019-07-13 – 2019-07-14 (×3): 1000 mg via INTRAVENOUS
  Filled 2019-07-13 (×3): qty 200

## 2019-07-13 MED ORDER — VANCOMYCIN HCL 750 MG/150ML IV SOLN: 750.0000 mg | Freq: Two times a day (BID) | INTRAVENOUS | Status: DC

## 2019-07-13 MED ORDER — CLOPIDOGREL BISULFATE 75 MG PO TABS
75.0000 mg | ORAL_TABLET | Freq: Every day | ORAL | Status: DC
  Administered 2019-07-13 – 2019-07-22 (×10): 75 mg via ORAL
  Filled 2019-07-13 (×10): qty 1

## 2019-07-13 MED ORDER — POTASSIUM CHLORIDE 10 MEQ/50ML IV SOLN
10.0000 meq | INTRAVENOUS | Status: DC
  Filled 2019-07-13: qty 50

## 2019-07-13 MED ORDER — FUROSEMIDE 10 MG/ML IJ SOLN
40.0000 mg | Freq: Two times a day (BID) | INTRAMUSCULAR | Status: DC
  Administered 2019-07-13 – 2019-07-20 (×15): 40 mg via INTRAVENOUS
  Filled 2019-07-13 (×15): qty 4

## 2019-07-13 MED ORDER — MIDODRINE HCL 5 MG PO TABS
5.0000 mg | ORAL_TABLET | Freq: Three times a day (TID) | ORAL | Status: DC
  Administered 2019-07-13 – 2019-07-25 (×38): 5 mg via ORAL
  Filled 2019-07-13 (×37): qty 1

## 2019-07-13 MED ORDER — NOREPINEPHRINE 4 MG/250ML-% IV SOLN: 0.0000 ug/min | INTRAVENOUS | Status: DC

## 2019-07-13 MED ORDER — POTASSIUM CHLORIDE 10 MEQ/50ML IV SOLN
10.0000 meq | INTRAVENOUS | Status: AC
  Administered 2019-07-13 (×6): 10 meq via INTRAVENOUS
  Filled 2019-07-13 (×5): qty 50

## 2019-07-13 MED ORDER — RIVAROXABAN 20 MG PO TABS
20.0000 mg | ORAL_TABLET | Freq: Every day | ORAL | Status: DC
  Administered 2019-07-13 – 2019-07-22 (×10): 20 mg via ORAL
  Filled 2019-07-13 (×10): qty 1

## 2019-07-13 NOTE — Progress Notes (Signed)
PHARMACY NOTE:  ANTIMICROBIAL RENAL DOSAGE ADJUSTMENT  Current antimicrobial regimen includes a mismatch between antimicrobial dosage and estimated renal function.  As per policy approved by the Pharmacy & Therapeutics and Medical Executive Committees, the antimicrobial dosage will be adjusted accordingly.  Current antimicrobial dosage:  Vancomycin resumed at 1gm q8 (as dosed previously)  Indication: r/o sepsis  Renal Function:   Estimated Creatinine Clearance: 79.3 mL/min (by C-G formula based on SCr of 1.06 mg/dL). []      On intermittent HD, scheduled: []      On CRRT    Antimicrobial dosage has been changed to:  Vancomycin 1gm q12  Thank you for allowing pharmacy to be a part of this patient's care.  Minda Ditto PharmD 07/13/2019 1:28 PM

## 2019-07-13 NOTE — Progress Notes (Signed)
eLink Physician-Brief Progress Note Patient Name: Tony Saunders DOB: [birth year] MRN: [id number]   Date of Service  07/13/2019  HPI/Events of Note  K+ 3.3  eICU Interventions  KCL 10 meq iv Q 1 hour x 6 doses per electrolyte replacement protocol.        [name] [name] 07/13/2019, 3:23 AM

## 2019-07-13 NOTE — Progress Notes (Signed)
eLink Physician-Brief Progress Note Patient Name: Tony Saunders DOB: 03/20/1955 MRN: [id number]   Date of Service  07/13/2019  HPI/Events of Note  Patient in pulmonary edema on BIPAP, he does have agitated delirium and needs a sedative that will not blunt his respiratory drive.  eICU Interventions  Precedex infusion @ 0.2 - 0.6 ordered.        [name] 07/13/2019, 2:49 AM

## 2019-07-13 NOTE — Progress Notes (Signed)
eLink Physician-Brief Progress Note Patient Name: Jamier Urbas DOB: 18-Apr-1955 MRN: [id number]   Date of Service  07/13/2019  HPI/Events of Note  Pulmonary edema  eICU Interventions  Lasix 40 mg iv Q 12 hours, BIPAP,  Check N-Peptide level.        [name] Peta Peachey 07/13/2019, 2:16 AM

## 2019-07-13 NOTE — Progress Notes (Signed)
PT Cancellation Note  Patient Details Name: Tony Saunders MRN: [id number] DOB: December 20, 1955   Cancelled Treatment:    Reason Eval/Treat Not Completed: Medical issues which prohibited therapy , per RN.  Currently on BiPAP. Claretha Cooper 07/13/2019, 10:05 AM  [place] Pager [phone] Office [phone]

## 2019-07-13 NOTE — Progress Notes (Addendum)
K+ 3.3  Pt to start oral K+ in AM. Will let MD Ogan know.

## 2019-07-13 NOTE — Progress Notes (Addendum)
NAME:  Khylin Gutridge, MRN:  [id number], DOB:  1955-11-10, LOS: 5 ADMISSION DATE:  07/08/2019, CONSULTATION DATE:  6/20 REFERRING MD:  Dr. Ashok Cordia, CHIEF COMPLAINT:  Weakness    Brief History   64 y/o M admitted 6/20 with reports of a 3 week history of diarrhea and progressive weakness.  Work up notable for pallor, febrile neutropenia, severe anemia (hgb 4.6) and heme positive stools (on xarelto).  The patient was recently admitted from 5/28-6/12 for hypotension, new AFwRVR s/p DCCV x2 (on amiodarone, xarelto, brilinta), pancytopenia requiring multiple transfusions.   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   Significant Hospital Events   6/20 Admit  6/21 Resp distress, BiPAP, lasix for pulm edema / vol overload from PRBC, SvO2 48 > milrinone  6/23 Heparin gtt initiated, on milrinone   Consults:  Cardiology   Procedures:  RUE Dual Lumen PICC 5/18 >>   Significant Diagnostic Tests:    Micro Data:  COVID 6/20 >> negative  BCx2 6/20 >>  UC 6/20 >> negative   Antimicrobials:  Flagyl 6/20 >> 6/24 Vanco 6/20 >> 6/24 Cefepime 6/20 >> 6/24  Interim history/subjective:  ABX discontinued 6/24 Worsening overnight requiring BiPAP and diuresis Precedex was started but BP dropped so only used for short time Lasix was added Q 12 BNP up to 608 WBC bump to 15.8 from 4.7 Remains Afebrile / WBC up to 4.7, cultures remain negative on D5 Glucose stable  I/O , -2100  in last 24 hours, 2400 cc since admission Transitioning to xaralto from heparin gtt CXR 6/25>> Persistent bilateral dense heterogeneous airspace opacities, slightly improved in the right lower lung though essentially unchanged elsewhere. Findings could  reflect infection or edema.  Objective   Blood pressure (!) 88/61, pulse (!) 105, temperature 97.6 F (36.4 C), temperature source Axillary, resp. rate (!) 35, height 6' (1.829 m), weight 81.7 kg, SpO2 93 %. CVP:  [1 mmHg-15 mmHg] 6 mmHg  FiO2 (%):  [100 %] 100 %   Intake/Output Summary (Last 24 hours) at 07/13/2019 0838 Last data filed at 07/13/2019 0510 Gross per 24 hour  Intake 971.86 ml  Output 3125 ml  Net -2153.14 ml   Filed Weights   07/11/19 0500 07/12/19 0500 07/13/19 0500  Weight: 85.4 kg 81.8 kg 81.7 kg    Examination: General: chronically ill appearing adult male lying on BiPAP, anxious HEENT: NCAT, MM pink/moist, anicteric Neuro: AAOx4, speech clear, MAE / generalized weakness  CV: s1s2 rrr, no m/r/g PULM: Bilateral chest excursion, mild increased WOB on BiPAP. Lungs with crackles and few rhonchi, no wheeze crackles GI: soft, NT, ND, sx4 active  Extremities: warm/dry, 1+ BLE pitting edema (improving but still edematous)  Skin: thin skin, scattered areas of ecchymosis, intact  Resolved Hospital Problem list      Assessment & Plan:   Neutropenic Fever  Pancytopenia >> WBC bump 6/25 to 15.8 Rule out opportunistic infection, no clear source sepsis  -appreciate Heme/Onc involvement  -per ONC notes, anticipate platelets to be the last to recover  -follow CBC , follow micro -D4 abx, reviewed with pharmacy, will stop abx and monitor closely after 6/24 dosing   Shock   CXR with infiltrates vs edema Suspect element of hypovolemic with  profound anemia, +/- septic, with additional decompensated systolic CHF  -continue stress dose steroids  - abx >> d/c'd over 6/24, but will resume in light of Increased WBC respiratory distress -follow SvO2 trend, Cards to continue  milrinone once diuresed  - MAP goal of > 65, will add levo as needed to maintain -resume abx as noted above  -continue home midodrine if Levophed is initiated  Acute Decompensated Systolic CHF  Hx AF,  CAD s/p PCI, HLD, Chronic Combined Systolic / Diastolic CHF  On midodrine at baseline. SvO2 48 on 6/21, LVEF 35-40%  -continue midodrine, amiodarone, digoxin  -brilinta, xarelto on hold, defer timing of restart to Cardiology / HEME.  Plan is to restart Xarelto, plavix once platelets >50 if ok with Heme/Onc -continue milrinone gtt  -follow I/O's  - Lasix held 6/24, Added back 6/25 at 3 am for respiratory distress - Now requiring BiPAP, increased WOB on HFNC - Will add low dose pressors as needed to support BP as diuresis is necessary in setting of worsening respiratory status - heparin gtt per pharmacy   Severe Anemia  Rule out GI bleed, FOBT positive on admit  -trend CBC -PPI  -heparin gtt per Cardiology / [place]  -monitor closely for bleeding  -may need GI evaluation at some point given FOBT + on admit.  Currently not a candidate for procedure  Interstitial Edema  Acute Hypoxemic Respiratory Failure  Former Smoker -O2 as needed to support sats >90% -lasix as ordered  -follow intermittent CXR  -pulmonary hygiene -IS, mobilize  -add brovana, pulmicort with wheezing   Hypokalemia  Hypomagnesemia  -added scheduled KCL, 40 mEq BID.  -follow up BMP at 1300 on 6/25 -pending review of above, lasix restarted- If lasix d/c'd at any point we will need to dc scheduled K. - Mag goal > 2 K Goal > 4   Mild Troponin Leak  Suspect in setting of profound anemia, demand. EKG without ischemic changes.  -tele monitoring   Diffuse Large Cell Lymphoma  Extensive disease involvement, s/p 2 cycles of chemotherapy with CHOP/rituximab, last on 6/11.  Thought per ONC to be a potentially treatable condition.   -Per ONC  -follow CBC   Pressure Ulcer  Sacral pressure ulcer, present on admit -wound care per protocol  -mobilize   Hx Seizure  -continue home lamictal, keppra   Severe Protein Calorie Malnutrition  -liberalize diet as poor intake.  Once improved, can return to heart healthy  diet  Best practice:  Diet: Regular diet  Pain/Anxiety/Delirium protocol (if indicated): n/a  VAP protocol (if indicated): n/a  DVT prophylaxis: SCD's  GI prophylaxis: PPI Glucose control: n/a  Mobility: as tolerated  Code Status: Full Code, pt would want intubation, CPR if necessary.  Would not want long term life support if no reasonable chance of recovery.  Family Communication: Patient updated on plan of care 6/24 Disposition: ICU   Labs   CBC: Recent Labs  Lab 07/08/19 1416 07/08/19 1416 07/09/19 1144 07/10/19 0634 07/11/19 0514 07/12/19 0404 07/13/19 0200  WBC 0.1*   < > 1.0* 2.3* 3.8* 4.7 15.8*  NEUTROABS 0.1*  --   --   --   --   --   --   HGB 4.6*   < > 8.9* 8.6* 8.0* 8.2* 10.1*  HCT 14.6*   < > 27.3* 26.3* 24.8* 25.2* 32.0*  MCV 85.4   < > 87.5 85.7 87.6 86.3 88.4  PLT 28*   < > 29* 28* 25* 41* 84*   < > =  values in this interval not displayed.    Basic Metabolic Panel: Recent Labs  Lab 07/08/19 1515 07/09/19 1144 07/10/19 0634 07/11/19 0514 07/12/19 0406 07/12/19 1445 07/13/19 0200  NA  --    < > 137 137 138 139 140  K  --    < > 2.8* 2.5* <2.0* 2.1* 3.3*  CL  --    < > 101 102 100 99 101  CO2  --    < > 25 26 29 29 25   GLUCOSE  --    < > 108* 128* 138* 185* 162*  BUN  --    < > 20 21 25* 24* 25*  CREATININE  --    < > 0.91 0.89 1.03 1.03 1.06  CALCIUM  --    < > 7.0* 6.6* 6.5* 6.5* 7.2*  MG 1.4*  --   --   --   --  1.8  --    < > = values in this interval not displayed.   GFR: Estimated Creatinine Clearance: 79.3 mL/min (by C-G formula based on SCr of 1.06 mg/dL). Recent Labs  Lab 07/08/19 1415 07/08/19 1416 07/08/19 1552 07/08/19 1709 07/09/19 1144 07/10/19 0634 07/11/19 0514 07/12/19 0404 07/13/19 0200  PROCALCITON  --   --   --  1.15  --   --   --   --   --   WBC  --    < >  --   --    < > 2.3* 3.8* 4.7 15.8*  LATICACIDVEN 2.7*  --  0.7  --   --   --   --   --   --    < > = values in this interval not displayed.    Liver  Function Tests: Recent Labs  Lab 07/08/19 1416 07/10/19 0634  AST 23 17  ALT 23 24  ALKPHOS 44 50  BILITOT 0.8 1.0  PROT 4.1* 4.2*  ALBUMIN 2.0* 1.9*   No results for input(s): LIPASE, AMYLASE in the last 168 hours. No results for input(s): AMMONIA in the last 168 hours.  ABG    Component Value Date/Time   HCO3 22.0 03/29/2019 1012   TCO2 23 03/29/2019 1012   ACIDBASEDEF 2.0 03/29/2019 1012   O2SAT 74.6 07/13/2019 0205     Coagulation Profile: Recent Labs  Lab 07/08/19 1416 07/10/19 1258  INR 1.8* 1.2    Cardiac Enzymes: No results for input(s): CKTOTAL, CKMB, CKMBINDEX, TROPONINI in the last 168 hours.  HbA1C: Hgb A1c MFr Bld  Date/Time Value Ref Range Status  03/27/2019 07:06 AM 5.3 4.8 - 5.6 % Final    Comment:    (NOTE) Pre diabetes:          5.7%-6.4% Diabetes:              >6.4% Glycemic control for   <7.0% adults with diabetes     CBG: No results for input(s): GLUCAP in the last 168 hours.   Critical care time: 35 minutes      Magdalen Spatz, MSN, AGACNP-BC [place] for personal pager PCCM on call pager [phone] 07/13/2019, 8:38 AM   Please see [email or web address] for pager details.

## 2019-07-13 NOTE — Progress Notes (Addendum)
Patient respiration was labored and tachypnic, O2 was at low 80's and breath sounds were expiratory wheezes, crackles. He is also complaining of anxiety. Paged Elink/MD and they ordered Lasix and early lab blood draws. Patient was placed back in Bipap and Precedex gtt also started. Will continue to monitor the patient.

## 2019-07-13 NOTE — Progress Notes (Signed)
RT had to place pt back on BiPAP due to low saturations and increased work of breathing. RT will continue to monitor.

## 2019-07-13 NOTE — Progress Notes (Signed)
Progress Note  Patient Name: Tony Saunders Date of Encounter: 07/13/2019  [facility] Cardiologist: Glenetta Hew, MD   Subjective   Feeling more short of breath.  Increasing dyspnea overnight requiring BiPAP.  Inpatient Medications    Scheduled Meds: . amiodarone  200 mg Oral Daily  . arformoterol  15 mcg Nebulization BID  . budesonide (PULMICORT) nebulizer solution  0.5 mg Nebulization BID  . chlorhexidine  15 mL Mouth Rinse BID  . Chlorhexidine Gluconate Cloth  6 each Topical Daily  . digoxin  0.125 mg Oral Daily  . feeding supplement  1 Container Oral BID BM  . feeding supplement (PRO-STAT SUGAR FREE 64)  30 mL Oral TID BM  . furosemide  40 mg Intravenous Q12H  . hydrocortisone sodium succinate  100 mg Intravenous Q8H  . lamoTRIgine  100 mg Oral BID  . levETIRAcetam  750 mg Oral BID  . mouth rinse  15 mL Mouth Rinse q12n4p  . midodrine  5 mg Oral TID WC  . mirtazapine  15 mg Oral QHS  . multivitamin with minerals  1 tablet Oral Daily  . pantoprazole (PROTONIX) IV  40 mg Intravenous Q24H  . potassium chloride  40 mEq Oral BID  . sertraline  25 mg Oral Daily  . sodium chloride flush  10-40 mL Intracatheter Q12H   Continuous Infusions: . dexmedetomidine (PRECEDEX) IV infusion Stopped (07/13/19 0606)  . heparin 1,500 Units/hr (07/13/19 0510)  . milrinone 0.25 mcg/kg/min (07/13/19 0510)  . potassium chloride 10 mEq (07/13/19 0708)   PRN Meds: docusate sodium, ipratropium, levalbuterol, polyethylene glycol, sodium chloride flush   Vital Signs    Vitals:   07/13/19 0705 07/13/19 0759 07/13/19 0803 07/13/19 0813  BP: (!) 88/61     Pulse: (!) 105   (!) 105  Resp: (!) 23   (!) 35  Temp:      TempSrc:      SpO2: 100% 94% 95% 93%  Weight:      Height:        Intake/Output Summary (Last 24 hours) at 07/13/2019 0821 Last data filed at 07/13/2019 0510 Gross per 24 hour  Intake 971.86 ml  Output 3125 ml  Net -2153.14 ml   Last 3 Weights 07/13/2019  07/12/2019 07/11/2019  Weight (lbs) 180 lb 1.9 oz 180 lb 5.4 oz 188 lb 4.4 oz  Weight (kg) 81.7 kg 81.8 kg 85.4 kg      Telemetry    Sinus rhythm, sinus tachycardia.  First degree AV block.  Intermittent atrial flutter.   - Personally Reviewed  ECG    n/a - Personally Reviewed  Physical Exam   CVP 5 VS:  BP (!) 88/61   Pulse (!) 105   Temp 97.6 F (36.4 C) (Axillary)   Resp (!) 35   Ht 6' (1.829 m)   Wt 81.7 kg   SpO2 93%   BMI 24.43 kg/m  , BMI Body mass index is 24.43 kg/m. GENERAL: Chronically ill-appearing.  BiPAP in place HEENT: Pupils equal round and reactive, fundi not visualized, oral mucosa unremarkable NECK:  No jugular venous distention, waveform within normal limits, carotid upstroke brisk and symmetric, no bruits LUNGS:  Rhonchi throughout.  No crackles HEART:  RRR.  Difficult to auscultate 2/2 coarse rhonchi ABD:  Flat, positive bowel sounds normal in frequency in pitch, no bruits, no rebound, no guarding, no midline pulsatile mass, no hepatomegaly, no splenomegaly EXT:  2 plus pulses throughout, 2+ pitting edema to mid tibia L>R-improving, no  cyanosis no clubbing SKIN:  Multiple ecchymoses NEURO:  Cranial nerves II through XII grossly intact, motor grossly intact throughout PSYCH:  Cognitively intact, oriented to person place and time   Labs    High Sensitivity Troponin:   Recent Labs  Lab 07/08/19 1416 07/08/19 1558  TROPONINIHS 96* 96*      Chemistry Recent Labs  Lab 07/08/19 1416 07/09/19 1144 07/10/19 0634 07/11/19 0514 07/12/19 0406 07/12/19 1445 07/13/19 0200  NA 137   < > 137   < > 138 139 140  K 3.1*   < > 2.8*   < > <2.0* 2.1* 3.3*  CL 104   < > 101   < > 100 99 101  CO2 25   < > 25   < > 29 29 25   GLUCOSE 93   < > 108*   < > 138* 185* 162*  BUN 15   < > 20   < > 25* 24* 25*  CREATININE 0.90   < > 0.91   < > 1.03 1.03 1.06  CALCIUM 7.6*   < > 7.0*   < > 6.5* 6.5* 7.2*  PROT 4.1*  --  4.2*  --   --   --   --   ALBUMIN 2.0*  --   1.9*  --   --   --   --   AST 23  --  17  --   --   --   --   ALT 23  --  24  --   --   --   --   ALKPHOS 44  --  50  --   --   --   --   BILITOT 0.8  --  1.0  --   --   --   --   GFRNONAA >60   < > >60   < > >60 >60 >60  GFRAA >60   < > >60   < > >60 >60 >60  ANIONGAP 8   < > 11   < > 9 11 14    < > = values in this interval not displayed.     Hematology Recent Labs  Lab 07/11/19 0514 07/12/19 0404 07/13/19 0200  WBC 3.8* 4.7 15.8*  RBC 2.83* 2.92* 3.62*  HGB 8.0* 8.2* 10.1*  HCT 24.8* 25.2* 32.0*  MCV 87.6 86.3 88.4  MCH 28.3 28.1 27.9  MCHC 32.3 32.5 31.6  RDW 17.4* 17.3* 17.8*  PLT 25* 41* 84*    BNP Recent Labs  Lab 07/08/19 1417 07/13/19 0200  BNP 597.3* 608.0*     DDimer No results for input(s): DDIMER in the last 168 hours.   Radiology    DG CHEST PORT 1 VIEW  Result Date: 07/13/2019 CLINICAL DATA:  Respiratory failure EXAM: PORTABLE CHEST 1 VIEW COMPARISON:  Radiograph 06/18/2019 FINDINGS: Right upper extremity PICC tip terminates at the superior cavoatrial junction. Telemetry leads overlie the chest. Coronary stent projects over the heart. Persistent bilateral dense heterogeneous airspace opacities are slightly improved in the right lower lung though essentially unchanged elsewhere. No pneumothorax. Small left and likely moderate right pleural effusion. No pneumothorax. Unchanged cardiomediastinal contours. No acute osseous or soft tissue abnormality. Degenerative changes are present in the imaged spine and shoulders. IMPRESSION: Persistent bilateral dense heterogeneous airspace opacities, slightly improved in the right lower lung though essentially unchanged elsewhere. Findings could reflect infection or edema. Electronically Signed   By: Lovena Le M.D.   On: 07/13/2019 05:33  Cardiac Studies   TTE 05/14/19   1. Compared to echo in March 2021, no significant change in LVEF or MR.  Pt is now in atrial fibrillation.  2. LVEF is severely depressed with  severe hypokinesis/akinesis of the  base/mid inferior, inferosepal, infer walls and apical walls; hypokinesis  elsewhere . Left ventricular ejection fraction, by estimation, is 30%. The  left ventricle has severely  decreased function. The left ventricular internal cavity size was mildly  dilated. There is mild left ventricular hypertrophy. Left ventricular  diastolic parameters are indeterminate.  3. Right ventricular systolic function is normal. The right ventricular  size is normal. There is normal pulmonary artery systolic pressure.  4. Left atrial size was mildly dilated.  5. MR is directed posteriorly into LA, likely due to tethering of  posterior leaflet. Severe mitral valve regurgitation.  6. The aortic valve is tricuspid. Aortic valve regurgitation is not  visualized. Mild aortic valve sclerosis is present, with no evidence of  aortic valve stenosis.  7. The inferior vena cava is normal in size with greater than 50%  respiratory variability, suggesting right atrial pressure of 3 mmHg.   FINDINGS  Left Ventricle: LVEF is severely depressed with severe  hypokinesis/akinesis of the base/mid inferior, inferosepal, infer walls  and apical walls; hypokinesis elsewhere. Left ventricular ejection  fraction, by estimation, is 30%%. The left ventricle has  severely decreased function. The left ventricle demonstrates regional wall  motion abnormalities. The left ventricular internal cavity size was mildly  dilated. There is mild left ventricular hypertrophy. Left ventricular  diastolic parameters are  indeterminate.   Right Ventricle: The right ventricular size is normal. Right vetricular  wall thickness was not assessed. Right ventricular systolic function is  normal. There is normal pulmonary artery systolic pressure. The tricuspid  regurgitant velocity is 2.41 m/s,  and with an assumed right atrial pressure of 10 mmHg, the estimated right  ventricular systolic pressure is  69.6 mmHg.   Left Atrium: Left atrial size was mildly dilated.   Right Atrium: Right atrial size was normal in size.   Pericardium: Trivial pericardial effusion is present.   Mitral Valve: MR is directed posteriorly into LA, likely due to tethering  of posterior leaflet. The mitral valve is grossly normal. Severe mitral  valve regurgitation, with posteriorly-directed jet.   Tricuspid Valve: The tricuspid valve is normal in structure. Tricuspid  valve regurgitation is mild.   Aortic Valve: The aortic valve is tricuspid. Aortic valve regurgitation is  not visualized. Mild aortic valve sclerosis is present, with no evidence  of aortic valve stenosis.   Pulmonic Valve: The pulmonic valve was normal in structure. Pulmonic valve  regurgitation is not visualized.   Aorta: The aortic root and ascending aorta are structurally normal, with  no evidence of dilitation.   Venous: The inferior vena cava is normal in size with greater than 50%  respiratory variability, suggesting right atrial pressure of 3 mmHg.   IAS/Shunts: No atrial level shunt detected by color flow Doppler.    Cardiac cath and PCI 03/29/19 Diagnostic Dominance: Right  Intervention     Patient Profile     Mr. 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    Assessment &  Plan    # Cardiogenic shock: # Possible septic shock # Severe MR:  Mr. Lundeen was doing well after starting milrinone.  He was diuresing well.  Lasix was held last evening because his potassium was consistently less than 2 after aggressive supplementation.  Overnight and this morning he became  increasingly dyspneic and required BiPAP.  He did receive another 40 mg of Lasix and was net -2 L yesterday.  CVP has ranged from 1-9.  Code-ox is 77.  Though he certainly has a cardiac component of his shock, I suspect that there may also be some sepsis as well.  WBC increased from 4.7-15.8.  Spoke with Dr. Vaughan Browner and they will resume antibiotics.  We will also order Levophed in addition to his milrinone.  Given that we will stop his midodrine for now.  He will need this started back once pressors are weaned.  Continue digoxin.  Maintain K>4, Mg>2.   # Persistent atrial flutter:  He has been in and out of atrial flutter but mostly in sinus rhythm, and there is no evidence that this is affecting his symptoms.  No plans to pursue repeat cardioversion at this time.  He was cardioverted twice in June and is on amiodarone and digoxin. Resume Xarelto 20mg  now that platelets are >50.  Continue digoxin.  # CAD s/p multiple PCI: Most recent PCI 03/2019.  Oral antiplatelets on hold due to pancytopenia as above.  Resume Plavix 75mg  now that platelets are >50K.  No beta blocker 2/2 shock.    # Diffuse large B-cell lymphoma: Actively being treated.  Had 2 cycles of R-CHOP.  Followed by Dr. Alen Blew.  Time spent: 45 minutes-Greater than 50% of this time was spent in counseling, explanation of diagnosis, planning of further management, and coordination of care.      For questions or updates, please contact [place] Please consult [email or web address] for contact info under        Signed, Skeet Latch, MD  07/13/2019, 8:21 AM

## 2019-07-13 NOTE — Progress Notes (Signed)
Potassium of 7.5 per lab on redraw after 2 runs of 10 mEq and scheduled 40 mEq po for a potassium of 3.3 this am. ( Total of 60 mEq since 2 am ) Will check a stat redraw and will treat if results  High with next draw. No bradycardia , chest pain, abdominal pain or diarrhea, nausea or vomiting.  No EKG changes, no worsening muscle weakness.  Magdalen Spatz, MSN, AGACNP-BC [place] See Amion for personal pager PCCM on call pager [phone]

## 2019-07-13 NOTE — Progress Notes (Signed)
RT took pt off BiPAP and placed on HFNC (salter) 15L. Pt tolerating well and says he is much more comfortable. RT will continue to monitor.

## 2019-07-13 NOTE — Progress Notes (Signed)
RT attempted to take pt off of BiPAP and placed on 15L HFNC. Pt tolerated for about 2 minutes and then requested to be placed back on the BiPAP. Pt tolerates the BiPAP well. Pt a little frustrated with not being able to stay off, RT explained that sometimes these things just take some time and that we can retry again tomorrow. RT will continue to monitor.

## 2019-07-14 ENCOUNTER — Inpatient Hospital Stay (HOSPITAL_COMMUNITY): Payer: No Typology Code available for payment source

## 2019-07-14 LAB — COOXEMETRY PANEL
Carboxyhemoglobin: 1.7 % — ABNORMAL HIGH (ref 0.5–1.5)
Methemoglobin: 0.6 % (ref 0.0–1.5)
O2 Saturation: 73.7 %
Total hemoglobin: 10 g/dL — ABNORMAL LOW (ref 12.0–16.0)

## 2019-07-14 LAB — BASIC METABOLIC PANEL
Anion gap: 9 (ref 5–15)
BUN: 24 mg/dL — ABNORMAL HIGH (ref 8–23)
CO2: 29 mmol/L (ref 22–32)
Calcium: 7.2 mg/dL — ABNORMAL LOW (ref 8.9–10.3)
Chloride: 102 mmol/L (ref 98–111)
Creatinine, Ser: 0.93 mg/dL (ref 0.61–1.24)
GFR calc Af Amer: >60 mL/min (ref >60)
GFR calc non Af Amer: >60 mL/min (ref >60)
Glucose, Bld: 116 mg/dL — ABNORMAL HIGH (ref 70–99)
Potassium: 2.8 mmol/L — ABNORMAL LOW (ref 3.5–5.1)
Sodium: 140 mmol/L (ref 135–145)

## 2019-07-14 LAB — CBC
HCT: 25.7 % — ABNORMAL LOW (ref 39.0–52.0)
Hemoglobin: 8.1 g/dL — ABNORMAL LOW (ref 13.0–17.0)
MCH: 27.9 pg (ref 26.0–34.0)
MCHC: 31.5 g/dL (ref 30.0–36.0)
MCV: 88.6 fL (ref 80.0–100.0)
Platelets: 53 10*3/uL — ABNORMAL LOW (ref 150–400)
RBC: 2.9 MIL/uL — ABNORMAL LOW (ref 4.22–5.81)
RDW: 18.2 % — ABNORMAL HIGH (ref 11.5–15.5)
WBC: 7.3 10*3/uL (ref 4.0–10.5)
nRBC: 0 % (ref 0.0–0.2)

## 2019-07-14 LAB — PROCALCITONIN: Procalcitonin: 0.44 ng/mL

## 2019-07-14 LAB — BRAIN NATRIURETIC PEPTIDE: B Natriuretic Peptide: 608.8 pg/mL — ABNORMAL HIGH (ref 0.0–100.0)

## 2019-07-14 LAB — STREP PNEUMONIAE URINARY ANTIGEN: Strep Pneumo Urinary Antigen: NEGATIVE

## 2019-07-14 LAB — MAGNESIUM: Magnesium: 1.9 mg/dL (ref 1.7–2.4)

## 2019-07-14 MED ORDER — IPRATROPIUM BROMIDE 0.02 % IN SOLN
0.5000 mg | RESPIRATORY_TRACT | Status: DC
  Administered 2019-07-14 – 2019-07-21 (×41): 0.5 mg via RESPIRATORY_TRACT
  Filled 2019-07-14 (×41): qty 2.5

## 2019-07-14 MED ORDER — POTASSIUM CHLORIDE 10 MEQ/50ML IV SOLN
10.0000 meq | INTRAVENOUS | Status: AC
  Administered 2019-07-14 (×6): 10 meq via INTRAVENOUS
  Filled 2019-07-14 (×6): qty 50

## 2019-07-14 MED ORDER — MAGNESIUM SULFATE 2 GM/50ML IV SOLN
2.0000 g | Freq: Once | INTRAVENOUS | Status: AC
  Administered 2019-07-14: 2 g via INTRAVENOUS
  Filled 2019-07-14: qty 50

## 2019-07-14 MED ORDER — HYDROCORTISONE NA SUCCINATE PF 100 MG IJ SOLR
100.0000 mg | Freq: Two times a day (BID) | INTRAMUSCULAR | Status: DC
  Administered 2019-07-14 – 2019-07-15 (×2): 100 mg via INTRAVENOUS
  Filled 2019-07-14 (×2): qty 2

## 2019-07-14 MED ORDER — LEVALBUTEROL HCL 1.25 MG/0.5ML IN NEBU
1.2500 mg | INHALATION_SOLUTION | RESPIRATORY_TRACT | Status: DC
  Administered 2019-07-14 – 2019-07-21 (×41): 1.25 mg via RESPIRATORY_TRACT
  Filled 2019-07-14 (×43): qty 0.5

## 2019-07-14 NOTE — Progress Notes (Signed)
Progress Note   Subjective   Remains very ill.  He is hypotensive and has high O2 requirement including Bipap.  Inpatient Medications    Scheduled Meds: . amiodarone  200 mg Oral Daily  . arformoterol  15 mcg Nebulization BID  . budesonide (PULMICORT) nebulizer solution  0.5 mg Nebulization BID  . chlorhexidine  15 mL Mouth Rinse BID  . Chlorhexidine Gluconate Cloth  6 each Topical Daily  . clopidogrel  75 mg Oral Daily  . digoxin  0.125 mg Oral Daily  . feeding supplement  1 Container Oral BID BM  . feeding supplement (PRO-STAT SUGAR FREE 64)  30 mL Oral TID BM  . furosemide  40 mg Intravenous Q12H  . hydrocortisone sodium succinate  100 mg Intravenous Q8H  . lamoTRIgine  100 mg Oral BID  . levETIRAcetam  750 mg Oral BID  . mouth rinse  15 mL Mouth Rinse q12n4p  . midodrine  5 mg Oral TID WC  . mirtazapine  15 mg Oral QHS  . multivitamin with minerals  1 tablet Oral Daily  . pantoprazole (PROTONIX) IV  40 mg Intravenous Q24H  . rivaroxaban  20 mg Oral Q breakfast  . sertraline  25 mg Oral Daily  . sodium chloride flush  10-40 mL Intracatheter Q12H   Continuous Infusions: . sodium chloride Stopped (07/13/19 2228)  . ceFEPime (MAXIPIME) IV Stopped (07/14/19 0141)  . dexmedetomidine (PRECEDEX) IV infusion Stopped (07/13/19 0606)  . metronidazole Stopped (07/14/19 0439)  . milrinone 0.25 mcg/kg/min (07/14/19 0700)  . norepinephrine (LEVOPHED) Adult infusion Stopped (07/13/19 0843)  . vancomycin Stopped (07/13/19 2325)   PRN Meds: sodium chloride, docusate sodium, ipratropium, levalbuterol, polyethylene glycol, sodium chloride flush   Vital Signs    Vitals:   07/14/19 0600 07/14/19 0700 07/14/19 0832 07/14/19 0835  BP: 95/74 119/60    Pulse: 99 94  100  Resp: (!) 28 (!) 24  (!) 27  Temp:   97.6 F (36.4 C)   TempSrc:   Axillary   SpO2: 100% 100%  (!) 83%  Weight:      Height:        Intake/Output Summary (Last 24 hours) at 07/14/2019 9798 Last data filed at  07/14/2019 0700 Gross per 24 hour  Intake 1585.16 ml  Output 2700 ml  Net -1114.84 ml   Filed Weights   07/12/19 0500 07/13/19 0500 07/14/19 0500  Weight: 81.8 kg 81.7 kg 81 kg    Telemetry    Sinus with frequent atrial fibrillation and atrial flutter - Personally Reviewed  Physical Exam   GEN- The patient is ill appearing, alert  Head- normocephalic, atraumatic Eyes-  Sclera clear, conjunctiva pink Ears- hearing intact Oropharynx- clear Neck- supple, Lungs-  normal work of breathing, + barrel chest Heart- Regular rate and rhythm  GI- soft  Extremities- no clubbing, cyanosis, or edema  MS- no significant deformity or atrophy Skin- no rash or lesion Psych- euthymic mood, full affect Neuro- strength and sensation are intact   Labs    Chemistry Recent Labs  Lab 07/08/19 1416 07/09/19 1144 07/10/19 0634 07/11/19 0514 07/13/19 1352 07/13/19 1539 07/14/19 0500  NA 137   < > 137   < > 139 140 140  K 3.1*   < > 2.8*   < > 7.5* 3.8 2.8*  CL 104   < > 101   < > 105 103 102  CO2 25   < > 25   < > 27 27 29  GLUCOSE 93   < > 108*   < > 141* 122* 116*  BUN 15   < > 20   < > 25* 27* 24*  CREATININE 0.90   < > 0.91   < > 1.05 0.98 0.93  CALCIUM 7.6*   < > 7.0*   < > 7.0* 7.1* 7.2*  PROT 4.1*  --  4.2*  --   --   --   --   ALBUMIN 2.0*  --  1.9*  --   --   --   --   AST 23  --  17  --   --   --   --   ALT 23  --  24  --   --   --   --   ALKPHOS 44  --  50  --   --   --   --   BILITOT 0.8  --  1.0  --   --   --   --   GFRNONAA >60   < > >60   < > >60 >60 >60  GFRAA >60   < > >60   < > >60 >60 >60  ANIONGAP 8   < > 11   < > 7 10 9    < > = values in this interval not displayed.     Hematology Recent Labs  Lab 07/12/19 0404 07/13/19 0200 07/14/19 0500  WBC 4.7 15.8* 7.3  RBC 2.92* 3.62* 2.90*  HGB 8.2* 10.1* 8.1*  HCT 25.2* 32.0* 25.7*  MCV 86.3 88.4 88.6  MCH 28.1 27.9 27.9  MCHC 32.5 31.6 31.5  RDW 17.3* 17.8* 18.2*  PLT 41* 84* 53*     Patient ID  Mr.  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   Assessment & Plan    1.  Acute on chronic systolic dysfunction Currently quite ill.  Though he has some degree of cardiogenic shock, I agree with Dr Oval Linsey that there is concern for septic shock also.  Continue inotropic support as needed.  He remains on milrinone to assist with diuresis.   Requires bipap.  Continue to diuresis as able given soft BP  2. Persistent afib/ atrial flutter Continue current medical therapy xarelto has been resumed.  We will need to follow closely in the setting of low platelets Continue amiodarone  3. CAD Now back on plavix.  Caution with low platelets.  4. Diffuse large B cell lymphoma Complicates therapy He has extensive disease involvement.   5. Anemia/ thrombocytopenia Trend CBC, careful with plavix and xarelto.  Prognosis is guarded  Critical care time was exclusive of separate billable procedures and treating other patients.  Critial care time was spent personally by me (independant of midlevel providers) on the following activities: development of treatment plan with patients wife as well as nursing,  evaluation of patients response to treatment, examining patient,  reviewing treatments/ interventions, lab studies, radiographic studies, pulse ox, and re-evaluation of patients condition.     The patient is critically ill with multiple organ systems failure and requires high complexity decision making for assessment and support, frequent evaluation and titration of therapies, application of advanced monitoring technologies and extensive interpretation of databases.  Critical care was necessary to treat or prevent immintent or life-threatening deterioration.   Total CCT spent directly with the patient today is 40 minutes  Thompson Grayer MD, [facility] 07/14/2019 10:49 AM

## 2019-07-14 NOTE — Progress Notes (Signed)
Events noted in the last few days. He continues to be in the ICU for respiratory failure requiring BiPAP and was not able to tolerate weaning yesterday. Continues to receive aggressive diuresis with Lasix and milrinone due to fluid overload and cardiogenic shock.  Laboratory data reviewed and showed expected white cell count recovery and overall stable hemoglobin. His platelet count still fluctuating which she has normal recovery from the previous cycle of chemotherapy.   He is scheduled for his third cycle next week but given his clinical status this will likely be withheld pending his progress in the next few days. I agree with the current CODE STATUS. Although lymphoma is a treatable condition he is dealing with multiple obstacles towards recovery due to cardiac and pulmonary reasons.  We will continue to follow his progress and discuss with him the plans for subsequent cycles of chemotherapy pending his clinical status.

## 2019-07-14 NOTE — Progress Notes (Signed)
PT Cancellation Note  Patient Details Name: Tony Saunders MRN: [id number] DOB: 24-Jul-1955   Cancelled Treatment:    Reason Eval/Treat Not Completed: Other (comment) Pt declines PT at this time.  Currently multiple family members in room and praying.  Will check back as schedule permits.   Celsey Asselin,KATHrine E 07/14/2019, 11:59 AM Arlyce Dice, DPT Acute Rehabilitation Services Pager: [phone] Office: [phone]

## 2019-07-14 NOTE — Progress Notes (Signed)
NAME:  Tony Saunders, MRN:  [id number], DOB:  [birth year], LOS: 6 ADMISSION DATE:  07/08/2019, CONSULTATION DATE:  6/20 REFERRING MD:  Dr. Ashok Cordia, CHIEF COMPLAINT:  Weakness    Brief History   64 y/o M admitted 6/20 with reports of a 3 week history of diarrhea and progressive weakness.  Work up notable for pallor, febrile neutropenia, severe anemia (hgb 4.6) and heme positive stools (on xarelto).  The patient was recently admitted from 5/28-6/12 for hypotension, new AFwRVR s/p DCCV x2 (on amiodarone, xarelto, brilinta), pancytopenia requiring multiple transfusions.   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   Significant Hospital Events   6/20 Admit  6/21 Resp distress, BiPAP, lasix for pulm edema / vol overload from PRBC, SvO2 48 > milrinone  6/23 Heparin gtt initiated, on milrinone   6/25 - ABX discontinued 6/24 Worsening overnight requiring BiPAP and diuresis Precedex was started but BP dropped so only used for short time Lasix was added Q 12 BNP up to 608 WBC bump to 15.8 from 4.7 Remains Afebrile / WBC up to 4.7, cultures remain negative on D5 Glucose stable  I/O , -2100  in last 24 hours, 2400 cc since admission Transitioning to [place] from heparin gtt  Consults:  Cardiology   Procedures:  RUE Dual Lumen PICC 5/18 >>   Significant Diagnostic Tests:    Micro Data:  COVID 6/20 >> negative  BCx2 6/20 >>  UC 6/20 >> negative   Antimicrobials:  Flagyl 6/20 >> 6/24 Vanco 6/20 >> 6/24 Cefepime 6/20 >> 6/24  Interim history/subjective:   6/26 - on milrionone gtt. On BiPAP 24/7/ Wants to trial off bipap Took off for 5 minutes and was able to converse without significant  distress. Wife at bedside.  K severely low  Mag < 2gm%.   Off levophed Off precredex gt On lasix, digoxin, amio tab, plavix and xaretlo On hydrocort since 6/20 (not on chronic steroids other than during chemo cycles )  Per onc cheduled for next chemo in 1 week  Objective   Blood pressure 119/60, pulse 90, temperature 97.6 F (36.4 C), temperature source Axillary, resp. rate (!) 27, height 6' (1.829 m), weight 81 kg, SpO2 96 %. CVP:  [4 mmHg-12 mmHg] 12 mmHg  FiO2 (%):  [100 %] 100 %   Intake/Output Summary (Last 24 hours) at 07/14/2019 0951 Last data filed at 07/14/2019 0700 Gross per 24 hour  Intake 1585.16 ml  Output 2700 ml  Net -1114.84 ml   Filed Weights   07/12/19 0500 07/13/19 0500 07/14/19 0500  Weight: 81.8 kg 81.7 kg 81 kg    Examination: General Appearance: emaciated barrrell chested male. Coming off bipap Head:  Normocephalic, without obvious abnormality, atraumatic Eyes:  PERRL - yes, conjunctiva/corneas - muddy     Ears:  Normal external ear canals, both ears Nose:  G tube - no Throat:  ETT TUBE - no , OG tube - no Neck:  Supple,  No enlargement/tenderness/nodules Lungs: Distant crackles and wheee, some purse lip breathing +, barrell chested, able to talk sentences somewhat Heart:  S1 and S2 normal, no murmur, CVP - no.  Pressors - no Abdomen:  Soft, no masses, no  organomegaly Genitalia / Rectal:  Not done Extremities:  Extremities- intact  Skin:  ntact in exposed areas . Sacral area - not examined. Has scattered bruises Neurologic:  Sedation - none -> RASS - +1 . Moves all 4s - yes. CAM-ICU - neg . Orientation - x 3+      Resolved Hospital Problem list   Ciruclatoyr shock   Assessment & Plan:   Baseline: advanced copd and former smoker  Current: Acute Hypoxemic Respiratory Failure with diffuse pulmonary infiltrates - concerns is acute on chronic s- CHF and neutropenic fever  07/14/2019 - still on bipap. Wants to try off bipap for a bit  Plan - o2  for pulse ox > 88%  - scheduled pulmicort bid  - scheduled xopenex and atrovent q4h  - dc brovana  - bipap QHS and day near continuous with some rest breaks  - intubate if declines    Acute Decompensated Systolic CHF  Hx AF, CAD s/p PCI, HLD, Chronic Combined Systolic / Diastolic CHF  On midodrine at baseline. SvO2 48 on 6/21, LVEF 35-40%   07/14/2019 - on milrionone gtt  plan -continue midodrine, amiodarone, digoxin , plavix, xarelto, lasix  - cards following   Neutropenic Fever  Pancytopenia >> WBC bump 6/25 to 15.8  07/14/2019 - low temp 97+  Plan  - check RVP  - check urine strep and urine legionella  - continue abx   Anemia  Rule out GI bleed, FOBT positive on admit   07/14/2019 - no overt bleed  plan -trend CBC -PPI  -heparin gtt per Cardiology / [place]  -monitor closely for bleeding  -may need GI evaluation at some point given FOBT + on admit.  Currently not a candidate for procedure - - PRBC for hgb </= 6.9gm%    - exceptions are   -  if ACS susepcted/confirmed then transfuse for hgb </= 8.0gm%,  or    -  active bleeding with hemodynamic instability, then transfuse regardless of hemoglobin value   At at all times try to transfuse 1 unit prbc as possible with exception of active hemorrhage   Electrolyte imbalance  07/14/2019 - low K an d mag  Plan - Mag goal > 2 K Goal > 4   Diffuse Large Cell Lymphoma  Extensive disease involvement, s/p 2 cycles of chemotherapy with CHOP/rituximab, last on 6/11.  Thought per ONC to be a potentially treatable condition.   -Per ONC  -follow CBC   Pressure Ulcer  Sacral pressure ulcer, present on admit -wound care per protocol  -mobilize   Hx Seizure  -continue home lamictal, keppra   Severe Protein Calorie Malnutrition  -liberalize diet as poor intake.  Once improved, can return to heart healthy diet  Best practice:  Diet: Regular diet  Pain/Anxiety/Delirium protocol (if indicated): n/a  VAP protocol (if  indicated): n/a  DVT prophylaxis: SCD's  GI prophylaxis: PPI Glucose control: n/a  Mobility: as tolerated  Code Status: Partial code - but ye for BiPAP and intubated  Family Communication: Patient updated on plan of care 6/26 to patient an dwife  Disposition: ICU    ATTESTATION & SIGNATURE   The patient Osamu Olguin is critically ill with multiple organ systems failure and requires high complexity decision making for assessment and support, frequent evaluation and titration of therapies, application of advanced monitoring technologies and extensive interpretation of multiple databases.   Critical Care Time devoted to patient care services described in this note is  32  Minutes. This  time reflects time of care of this signee Dr Brand Males. This critical care time does not reflect procedure time, or teaching time or supervisory time of PA/NP/Med student/Med Resident etc but could involve care discussion time     Dr. Brand Males, M.D., [facility].C.P Pulmonary and Critical Care Medicine Staff Physician Micro Pulmonary and Critical Care Pager: [phone], If no answer or between  15:00h - 7:00h: call [phone]  07/14/2019 9:53 AM     LABS    PULMONARY Recent Labs  Lab 07/10/19 0634 07/11/19 0514 07/12/19 0404 07/13/19 0205 07/14/19 0500  O2SAT 55.1 87.6 89.9 74.6 73.7    CBC Recent Labs  Lab 07/12/19 0404 07/13/19 0200 07/14/19 0500  HGB 8.2* 10.1* 8.1*  HCT 25.2* 32.0* 25.7*  WBC 4.7 15.8* 7.3  PLT 41* 84* 53*    COAGULATION Recent Labs  Lab 07/08/19 1416 07/10/19 1258  INR 1.8* 1.2    CARDIAC  No results for input(s): TROPONINI in the last 168 hours. No results for input(s): PROBNP in the last 168 hours.   CHEMISTRY Recent Labs  Lab 07/08/19 1515 07/09/19 1144 07/12/19 1445 07/12/19 1445 07/13/19 0200 07/13/19 0200 07/13/19 1352 07/13/19 1352 07/13/19 1539 07/14/19 0500  NA  --    < > 139  --   140  --  139  --  140 140  K  --    < > 2.1*   < > 3.3*   < > 7.5*   < > 3.8 2.8*  CL  --    < > 99  --  101  --  105  --  103 102  CO2  --    < > 29  --  25  --  27  --  27 29  GLUCOSE  --    < > 185*  --  162*  --  141*  --  122* 116*  BUN  --    < > 24*  --  25*  --  25*  --  27* 24*  CREATININE  --    < > 1.03  --  1.06  --  1.05  --  0.98 0.93  CALCIUM  --    < > 6.5*  --  7.2*  --  7.0*  --  7.1* 7.2*  MG 1.4*  --  1.8  --  2.1  --   --   --   --  1.9   < > = values in this interval not displayed.   Estimated Creatinine Clearance: 90.4 mL/min (by C-G formula based on SCr of 0.93 mg/dL).   LIVER Recent Labs  Lab 07/08/19 1416 07/10/19 0634 07/10/19 1258  AST 23 17  --   ALT 23 24  --   ALKPHOS 44 50  --   BILITOT 0.8 1.0  --   PROT 4.1* 4.2*  --   ALBUMIN 2.0* 1.9*  --   INR 1.8*  --  1.2     INFECTIOUS Recent Labs  Lab 07/08/19 1415 07/08/19 1552 07/08/19 1709 07/13/19 1352 07/14/19 0500  LATICACIDVEN 2.7* 0.7  --   --   --   PROCALCITON  --   --  1.15 0.69 0.44     ENDOCRINE CBG (last 3)  No results for input(s): GLUCAP in the last 72 hours.       IMAGING x48h  - image(s) personally visualized  -   highlighted in bold DG  CHEST PORT 1 VIEW  Result Date: 07/14/2019 CLINICAL DATA:  Respiratory failure EXAM: PORTABLE CHEST 1 VIEW COMPARISON:  July 13, 2019 FINDINGS: A right PICC line terminates in the central SVC. No pneumothorax. Diffuse bilateral pulmonary infiltrates are similar in the interval. No change in the cardiomediastinal silhouette. IMPRESSION: Bilateral pulmonary infiltrates are similar to slightly improved in the interval. Recommend follow-up to resolution. No other acute abnormalities or changes. Electronically Signed   By: Dorise Bullion III M.D   On: 07/14/2019 06:23   DG CHEST PORT 1 VIEW  Result Date: 07/13/2019 CLINICAL DATA:  Respiratory failure EXAM: PORTABLE CHEST 1 VIEW COMPARISON:  Radiograph 06/18/2019 FINDINGS: Right upper  extremity PICC tip terminates at the superior cavoatrial junction. Telemetry leads overlie the chest. Coronary stent projects over the heart. Persistent bilateral dense heterogeneous airspace opacities are slightly improved in the right lower lung though essentially unchanged elsewhere. No pneumothorax. Small left and likely moderate right pleural effusion. No pneumothorax. Unchanged cardiomediastinal contours. No acute osseous or soft tissue abnormality. Degenerative changes are present in the imaged spine and shoulders. IMPRESSION: Persistent bilateral dense heterogeneous airspace opacities, slightly improved in the right lower lung though essentially unchanged elsewhere. Findings could reflect infection or edema. Electronically Signed   By: Lovena Le M.D.   On: 07/13/2019 05:33

## 2019-07-14 NOTE — Progress Notes (Signed)
Pt. Has audible gurgling at this time on HHFNC and NRB.  Will place on Bipap for the night to help with fluid.

## 2019-07-15 DIAGNOSIS — Z515 Encounter for palliative care: Secondary | ICD-10-CM

## 2019-07-15 DIAGNOSIS — R531 Weakness: Secondary | ICD-10-CM

## 2019-07-15 DIAGNOSIS — Z7189 Other specified counseling: Secondary | ICD-10-CM

## 2019-07-15 LAB — BASIC METABOLIC PANEL
Anion gap: 8 (ref 5–15)
BUN: 22 mg/dL (ref 8–23)
CO2: 31 mmol/L (ref 22–32)
Calcium: 7 mg/dL — ABNORMAL LOW (ref 8.9–10.3)
Chloride: 99 mmol/L (ref 98–111)
Creatinine, Ser: 0.9 mg/dL (ref 0.61–1.24)
GFR calc Af Amer: >60 mL/min (ref >60)
GFR calc non Af Amer: >60 mL/min (ref >60)
Glucose, Bld: 113 mg/dL — ABNORMAL HIGH (ref 70–99)
Potassium: 3.5 mmol/L (ref 3.5–5.1)
Sodium: 138 mmol/L (ref 135–145)

## 2019-07-15 LAB — CBC
HCT: 24.4 % — ABNORMAL LOW (ref 39.0–52.0)
Hemoglobin: 7.6 g/dL — ABNORMAL LOW (ref 13.0–17.0)
MCH: 27.8 pg (ref 26.0–34.0)
MCHC: 31.1 g/dL (ref 30.0–36.0)
MCV: 89.4 fL (ref 80.0–100.0)
Platelets: 55 10*3/uL — ABNORMAL LOW (ref 150–400)
RBC: 2.73 MIL/uL — ABNORMAL LOW (ref 4.22–5.81)
RDW: 18.7 % — ABNORMAL HIGH (ref 11.5–15.5)
WBC: 8.8 10*3/uL (ref 4.0–10.5)
nRBC: 0 % (ref 0.0–0.2)

## 2019-07-15 LAB — COOXEMETRY PANEL
Carboxyhemoglobin: 1.7 % — ABNORMAL HIGH (ref 0.5–1.5)
Methemoglobin: 0.5 % (ref 0.0–1.5)
O2 Saturation: 69.2 %
Total hemoglobin: 8.8 g/dL — ABNORMAL LOW (ref 12.0–16.0)

## 2019-07-15 LAB — RESPIRATORY PANEL BY PCR

## 2019-07-15 LAB — MAGNESIUM: Magnesium: 2.1 mg/dL (ref 1.7–2.4)

## 2019-07-15 LAB — PHOSPHORUS: Phosphorus: 1 mg/dL — CL (ref 2.5–4.6)

## 2019-07-15 LAB — SAR COV2 SEROLOGY (COVID19)AB(IGG),IA: SARS-CoV-2 Ab, IgG: NONREACTIVE

## 2019-07-15 MED ORDER — LIP MEDEX EX OINT
TOPICAL_OINTMENT | CUTANEOUS | Status: DC | PRN
  Filled 2019-07-15: qty 7

## 2019-07-15 MED ORDER — POTASSIUM PHOSPHATES 15 MMOLE/5ML IV SOLN
30.0000 mmol | Freq: Once | INTRAVENOUS | Status: AC
  Administered 2019-07-15: 30 mmol via INTRAVENOUS
  Filled 2019-07-15: qty 10

## 2019-07-15 MED ORDER — HYDROCORTISONE NA SUCCINATE PF 100 MG IJ SOLR
100.0000 mg | Freq: Every day | INTRAMUSCULAR | Status: DC
  Administered 2019-07-16: 100 mg via INTRAVENOUS
  Filled 2019-07-15: qty 2

## 2019-07-15 NOTE — Progress Notes (Signed)
Pt sitting in recliner, unable to stand from recliner with 2 person assist, tried x2. Pt was transferred to bed using the skylift. Tony Saunders

## 2019-07-15 NOTE — Consult Note (Signed)
Consultation Note Date: 07/15/2019   Patient Name: Tony Saunders  DOB: [birth year]  MRN: [id number]  Age / Sex: 64 y.o., male  PCP: Tony Saunders, Tony Halsted, MD Referring Physician: Marshell Garfinkel, MD  Reason for Consultation: Establishing goals of care  HPI/Patient Profile: 64 y.o. male  admitted on 07/08/2019     Clinical Assessment and Goals of Care: [name] is a 64 year old gentleman who lives at home with his wife, has children who live nearby and are involved in his care, recently was diagnosed with myocardial infarction for which he underwent intervention was found to have coronary artery disease three-vessel and severe mitral regurgitation.  Was found to have lower extremity edema and was worked up with venous Doppler ultrasound, was found to have lymphadenopathy.  Further work-up of that was done and patient has been diagnosed with diffuse large B-cell lymphoma and has been initiated on chemotherapy, he is under the care of Dr. Alen Saunders.  Patient has been admitted with 3-week history of diarrhea and progressive weakness.  He has been admitted to the hospital with respiratory distress requiring BiPAP up until this morning, was also given heparin drip and milrinone.  Patient has had pancytopenia requiring multiple transfusions.  Patient remains admitted to the hospital, has advanced baseline COPD, former history of smoking.  Currently hospitalized for acute hypoxic respiratory failure found to have diffuse pulmonary infiltrates, positive for rhinovirus.  Also undergoing medication titration for acute on chronic systolic heart failure, also undergoing treatment for neutropenic fever.  Pulmonary critical care medicine as well as cardiology and oncology are following.  Palliative consultation has been requested for goals of care discussions.  [name] is awake alert resting in bed.  His daughters  present at the bedside.  Introduced myself and palliative care as follows: Palliative medicine is specialized medical care for people living with serious illness. It focuses on providing relief from the symptoms and stress of a serious illness. The goal is to improve quality of life for both the patient and the family.  Goals of care: Broad aims of medical therapy in relation to the patient's values and preferences. Our aim is to provide medical care aimed at enabling patients to achieve the goals that matter most to them, given the circumstances of their particular medical situation and their constraints.   See below  NEXT OF KIN Wife and daughter  SUMMARY OF RECOMMENDATIONS   Partial code.  Continue for now. Continue current mode of care We will recommend palliative services following at the cancer center, will recommend home with home-based palliative services with home health care home physical therapy on discharge versus skilled nursing facility with palliative services following at the time of discharge. An initial introduction to palliative care, broad overview of differences between hospice and palliative were discussed in today's initial meeting.  Palliative to follow hospital course and overall disease trajectory to help guide further conversations and further decision making. Thank you for the consult  Code Status/Advance Care Planning:  Limited code    Symptom  Management:    as above.   Palliative Prophylaxis:   Delirium Protocol   Psycho-social/Spiritual:   Desire for further Chaplaincy support:yes  Additional Recommendations: Caregiving  Support/Resources  Prognosis:   Unable to determine  Discharge Planning: Home with Palliative Services      Primary Diagnoses: Present on Admission: . Neutropenic sepsis ([place])   I have reviewed the medical record, interviewed the patient and family, and examined the patient. The following aspects are pertinent.  Past  Medical History:  Diagnosis Date  . 3vessel CAD- S/P PCI 03/27/2019   Remote pRCA PCI-stenting 2006. Acute MI 03/27/2019 treated with urgent m-d RCA PCI and stent (x 2) followed by staged PCI DES to CFX (x2 stents) and LAD (x1) on 03/29/2019  . Acute ST elevation myocardial infarction (STEMI) of inferior wall ([place]) 03/27/2019   Pt presented 03/27/2019 with an acute inferior MI- Cath 03/27/19 showed thrombotic occlusion of mid-distal RCA with a patent previously placed pRCA stent -2 overlapping Synergy DES from PDA back into proximal RCA stented segment and PTCA of RPAV (jailed)-PL 3 He also had concomitant high garde CFX and LAD disease (staged PCI) with an EF of 30-35%  . Chronic combined systolic and diastolic CHF, NYHA class 2 and ACC/AHA stage C ([place]) 03/27/2019   EF 30 to 40% with diffuse inferior hypokinesis/akinesis; severe ischemic MR  . 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 Myocardial infarction [facility]) 2006   PCI of the RCA  . PAF (persistent-paroxysmal atrial fibrillation) ([place]) 04/16/2019   Brief- post MI  . Seizure, temporal lobe ([place]) 2017   most recent 07/23/15-on Keppra and Lamictal  . Severe mitral regurgitation by prior echocardiogram-likely ischemic with tethered posterior leaflet 03/31/2019   Repeat echo 4-6 weeks post MI   Social History   Socioeconomic History  . Marital status: Married    Spouse name: Tony Saunders  . Number of children: 3  . Years of education: 13, [employer]  . 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 Smoking status: Former Smoker    Types: Cigarettes    Quit date: 01/22/2018    Years since quitting: 1.4  . Smokeless tobacco: Never Used  . Tobacco comment: 20  Vaping Use  . Vaping Use: Never used  Substance and Sexual Activity  . Alcohol use: Not Currently    Alcohol/week: 0.0 standard drinks    Comment: 2-5 beers daily  . Drug use: No  . Sexual  activity: Yes  Other Topics Concern  . Not on file  Social History Narrative   Former [place] [employer]   Lives with wife   Right-handed   Caffeine: 3-4 cups per day   Social Determinants of Health   Financial Resource Strain:   . Difficulty of Paying Living Expenses:   Food Insecurity:   . Worried About Charity fundraiser in the Last Year:   . Arboriculturist in the Last Year:   Transportation Needs:   . Film/video editor (Medical):   Marland Kitchen Lack of Transportation (Non-Medical):   Physical Activity:   . Days of Exercise per Week:   . Minutes of Exercise per Session:   Stress:   . Feeling of Stress :   Social Connections:   . Frequency of Communication with Friends and Family:   . Frequency of Social Gatherings with Friends and Family:   . Attends Religious Services:   .  Active Member of Clubs or Organizations:   . Attends Archivist Meetings:   Marland Kitchen Marital Status:    Family History  Problem Relation Age of Onset  . Heart disease Father   . Healthy Mother   . Heart disease Paternal Grandfather    Scheduled Meds: . amiodarone  200 mg Oral Daily  . budesonide (PULMICORT) nebulizer solution  0.5 mg Nebulization BID  . chlorhexidine  15 mL Mouth Rinse BID  . Chlorhexidine Gluconate Cloth  6 each Topical Daily  . clopidogrel  75 mg Oral Daily  . digoxin  0.125 mg Oral Daily  . feeding supplement  1 Container Oral BID BM  . feeding supplement (PRO-STAT SUGAR FREE 64)  30 mL Oral TID BM  . furosemide  40 mg Intravenous Q12H  . [START ON 07/16/2019] hydrocortisone sodium succinate  100 mg Intravenous Daily  . ipratropium  0.5 mg Inhalation Q4H  . lamoTRIgine  100 mg Oral BID  . levalbuterol  1.25 mg Nebulization Q4H  . levETIRAcetam  750 mg Oral BID  . mouth rinse  15 mL Mouth Rinse q12n4p  . midodrine  5 mg Oral TID WC  . mirtazapine  15 mg Oral QHS  . multivitamin with minerals  1 tablet Oral Daily  . pantoprazole (PROTONIX) IV  40 mg Intravenous Q24H  .  rivaroxaban  20 mg Oral Q breakfast  . sertraline  25 mg Oral Daily  . sodium chloride flush  10-40 mL Intracatheter Q12H   Continuous Infusions: . sodium chloride Stopped (07/15/19 0422)  . ceFEPime (MAXIPIME) IV Stopped (07/15/19 0941)  . milrinone 0.25 mcg/kg/min (07/15/19 1100)   PRN Meds:.sodium chloride, docusate sodium, lip balm, polyethylene glycol, sodium chloride flush Medications Prior to Admission:  Prior to Admission medications   Medication Sig Start Date End Date Taking? Authorizing Provider  allopurinol (ZYLOPRIM) 300 MG tablet Take 1 tablet (300 mg total) by mouth daily. 06/01/19  Yes Wyatt Portela, MD  amiodarone (PACERONE) 200 MG tablet Take 200 mg PO BID x 2 wk then 200 mg ONCE daily. Patient taking differently: Take 200 mg by mouth See admin instructions. Take 200 mg PO BID x 2 wk then 200 mg ONCE daily. 06/30/19  Yes Antonieta Pert, MD  calcium carbonate (TUMS - DOSED IN MG ELEMENTAL CALCIUM) 500 MG chewable tablet Chew 1 tablet (200 mg of elemental calcium total) by mouth 3 (three) times daily. 06/30/19 07/30/19 Yes Antonieta Pert, MD  digoxin (LANOXIN) 0.125 MG tablet Take 1 tablet (125 mcg total) by mouth daily. 07/05/19 07/04/20 Yes Bensimhon, Shaune Pascal, MD  feeding supplement, ENSURE ENLIVE, (ENSURE ENLIVE) LIQD Take 237 mLs by mouth 2 (two) times daily between meals. 06/30/19  Yes Antonieta Pert, MD  furosemide (LASIX) 20 MG tablet Take 1 tablet (20 mg total) by mouth daily. 07/05/19 07/04/20 Yes Bensimhon, Shaune Pascal, MD  lamoTRIgine (LAMICTAL) 100 MG tablet Take 1 tablet (100 mg total) by mouth 2 (two) times daily. 05/17/18  Yes Penumalli, Earlean Polka, MD  levETIRAcetam (KEPPRA) 750 MG tablet Take 1 tablet (750 mg total) by mouth 2 (two) times daily. 05/17/18  Yes Penumalli, Earlean Polka, MD  lidocaine-prilocaine (EMLA) cream Apply 1 application topically as needed. 05/30/19  Yes Wyatt Portela, MD  midodrine (PROAMATINE) 5 MG tablet Take 1 tablet (5 mg total) by mouth 3 (three) times daily  with meals. 07/05/19  Yes Bensimhon, Shaune Pascal, MD  mirtazapine (REMERON SOL-TAB) 15 MG disintegrating tablet Take 1 tablet (15 mg total)  by mouth at bedtime. 06/30/19 07/30/19 Yes Antonieta Pert, MD  nitroGLYCERIN (NITROSTAT) 0.4 MG SL tablet Place 1 tablet (0.4 mg total) under the tongue every 5 (five) minutes as needed for chest pain. 04/16/19 07/15/19 Yes Kilroy, Luke K, PA-C  ondansetron (ZOFRAN) 4 MG tablet Take 1 tablet (4 mg total) by mouth every 6 (six) hours as needed for nausea. 05/25/19  Yes Georgette Shell, MD  pantoprazole (PROTONIX) 40 MG tablet Take 1 tablet (40 mg total) by mouth daily. 06/30/19  Yes Kc, Maren Beach, MD  potassium chloride SA (KLOR-CON) 20 MEQ tablet Take 1 tablet (20 mEq total) by mouth daily. 07/05/19  Yes Bensimhon, Shaune Pascal, MD  prochlorperazine (COMPAZINE) 10 MG tablet Take 1 tablet (10 mg total) by mouth every 6 (six) hours as needed for nausea or vomiting. 05/30/19  Yes Wyatt Portela, MD  rivaroxaban (XARELTO) 20 MG TABS tablet Take 1 tablet (20 mg total) by mouth daily with supper. Patient taking differently: Take 20 mg by mouth daily at 8 pm.  04/17/19  Yes Leonie Man, MD  rosuvastatin (CRESTOR) 5 MG tablet Take 1 tablet (5 mg total) by mouth daily. Patient taking differently: Take 5 mg by mouth at bedtime.  04/16/19 07/15/19 Yes Kilroy, Luke K, PA-C  sertraline (ZOLOFT) 25 MG tablet Take 1 tablet (25 mg total) by mouth daily. 05/17/18  Yes Penumalli, Earlean Polka, MD  ticagrelor (BRILINTA) 90 MG TABS tablet Take 90 mg by mouth 2 (two) times daily. Finish prescription and then switch to Plavix   Yes [provider]  clopidogrel (PLAVIX) 75 MG tablet Take 1 tablet (75 mg total) by mouth daily. 06/07/19   Leonie Man, MD  predniSONE (DELTASONE) 50 MG tablet Take 2 tablets for 5 days every 21 days with chemotherapy. Patient not taking: Reported on 07/05/2019 05/30/19   Wyatt Portela, MD   No Known Allergies Review of Systems +shortness of breath  Physical  Exam Awake alert resting in bed Appears frail and cachectic Has muscle weakness evident Is on heated high flow nasal cannula Has scattered bruises Responds appropriately S1-S2 Coarse congested breath sounds anterior lung fields  Vital Signs: BP (!) 96/50   Pulse 90   Temp 97.7 F (36.5 C) (Axillary)   Resp (!) 24   Ht 6' (1.829 m)   Wt 81.2 kg   SpO2 93%   BMI 24.28 kg/m  Pain Scale: 0-10   Pain Score: 0-No pain   SpO2: SpO2: 93 % O2 Device:SpO2: 93 % O2 Flow Rate: .O2 Flow Rate (L/min): 15 L/min  IO: Intake/output summary:   Intake/Output Summary (Last 24 hours) at 07/15/2019 1326 Last data filed at 07/15/2019 1100 Gross per 24 hour  Intake 1813.02 ml  Output 3050 ml  Net -1236.98 ml    LBM: Last BM Date: 07/15/19 Baseline Weight: Weight: 77.1 kg Most recent weight: Weight: 81.2 kg     Palliative Assessment/Data:   Palliative performance scale 40%  Time In:  12 Time Out:  1300 Time Total:   60  Greater than 50%  of this time was spent counseling and coordinating care related to the above assessment and plan.  Signed by: Loistine Chance, MD   Please contact Palliative Medicine Team phone at [phone] for questions and concerns.  For individual provider: See Shea Evans

## 2019-07-15 NOTE — Progress Notes (Signed)
Progress Note   Subjective   Doing well today, the patient denies CP.  SOB is better.  No new concerns  Inpatient Medications    Scheduled Meds: . amiodarone  200 mg Oral Daily  . budesonide (PULMICORT) nebulizer solution  0.5 mg Nebulization BID  . chlorhexidine  15 mL Mouth Rinse BID  . Chlorhexidine Gluconate Cloth  6 each Topical Daily  . clopidogrel  75 mg Oral Daily  . digoxin  0.125 mg Oral Daily  . feeding supplement  1 Container Oral BID BM  . feeding supplement (PRO-STAT SUGAR FREE 64)  30 mL Oral TID BM  . furosemide  40 mg Intravenous Q12H  . hydrocortisone sodium succinate  100 mg Intravenous Q12H  . ipratropium  0.5 mg Inhalation Q4H  . lamoTRIgine  100 mg Oral BID  . levalbuterol  1.25 mg Nebulization Q4H  . levETIRAcetam  750 mg Oral BID  . mouth rinse  15 mL Mouth Rinse q12n4p  . midodrine  5 mg Oral TID WC  . mirtazapine  15 mg Oral QHS  . multivitamin with minerals  1 tablet Oral Daily  . pantoprazole (PROTONIX) IV  40 mg Intravenous Q24H  . rivaroxaban  20 mg Oral Q breakfast  . sertraline  25 mg Oral Daily  . sodium chloride flush  10-40 mL Intracatheter Q12H   Continuous Infusions: . sodium chloride Stopped (07/15/19 0422)  . ceFEPime (MAXIPIME) IV 2 g (07/15/19 0911)  . metronidazole Stopped (07/15/19 0522)  . milrinone 0.25 mcg/kg/min (07/15/19 0800)  . potassium PHOSPHATE IVPB (in mmol) 85 mL/hr at 07/15/19 0800  . vancomycin Stopped (07/14/19 2255)   PRN Meds: sodium chloride, docusate sodium, polyethylene glycol, sodium chloride flush   Vital Signs    Vitals:   07/15/19 0403 07/15/19 0425 07/15/19 0828 07/15/19 0907  BP:   (!) 92/59   Pulse:   85   Resp:   (!) 26   Temp:    (!) 97.5 F (36.4 C)  TempSrc:    Axillary  SpO2: 100%  95%   Weight:  81.2 kg    Height:        Intake/Output Summary (Last 24 hours) at 07/15/2019 1007 Last data filed at 07/15/2019 0800 Gross per 24 hour  Intake 1834.04 ml  Output 3800 ml  Net  -1965.96 ml   Filed Weights   07/13/19 0500 07/14/19 0500 07/15/19 0425  Weight: 81.7 kg 81 kg 81.2 kg    Telemetry    afib - Personally Reviewed  Physical Exam   GEN- The patient is ill appearing, alert and oriented x 3 today.   Head- normocephalic, atraumatic Eyes-  Sclera clear, conjunctiva pink Ears- hearing intact Oropharynx- clear Neck- supple, Lungs-  normal work of breathing Heart- Regular rate and rhythm  GI- soft  Extremities- no clubbing, cyanosis, + dependant edema  MS- no significant deformity or atrophy Skin- no rash or lesion Psych- euthymic mood, full affect Neuro- strength and sensation are intact   Labs    Chemistry Recent Labs  Lab 07/08/19 1416 07/09/19 1144 07/10/19 0634 07/11/19 0514 07/13/19 1539 07/14/19 0500 07/15/19 0030  NA 137   < > 137   < > 140 140 138  K 3.1*   < > 2.8*   < > 3.8 2.8* 3.5  CL 104   < > 101   < > 103 102 99  CO2 25   < > 25   < > 27 29 31   GLUCOSE  93   < > 108*   < > 122* 116* 113*  BUN 15   < > 20   < > 27* 24* 22  CREATININE 0.90   < > 0.91   < > 0.98 0.93 0.90  CALCIUM 7.6*   < > 7.0*   < > 7.1* 7.2* 7.0*  PROT 4.1*  --  4.2*  --   --   --   --   ALBUMIN 2.0*  --  1.9*  --   --   --   --   AST 23  --  17  --   --   --   --   ALT 23  --  24  --   --   --   --   ALKPHOS 44  --  50  --   --   --   --   BILITOT 0.8  --  1.0  --   --   --   --   GFRNONAA >60   < > >60   < > >60 >60 >60  GFRAA >60   < > >60   < > >60 >60 >60  ANIONGAP 8   < > 11   < > 10 9 8    < > = values in this interval not displayed.     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   Assessment & Plan    1.  Acute on chronic systolic dysfunction Remains ill, though SOB is improved Continue inotropic support and diuresis as able Requires bipap.  Diuresis is limited by hypotension  2. Persistent afib/ atrial flutter Continue xarelto with caution given low platelets On amiodarone  3. CAD No ischemic symptoms  4. Anemia/ thrombocytopenia plts are stable with plavix added recently More anemic  5. Diffuse large B cell lymphoma He has extensive disease involvement  Prognosis is poor  Thompson Grayer MD, [facility] 07/15/2019 10:07 AM

## 2019-07-15 NOTE — Progress Notes (Signed)
NAME:  Tony Saunders, MRN:  [id number], DOB:  1955-07-23, LOS: 7 ADMISSION DATE:  07/08/2019, CONSULTATION DATE:  6/20 REFERRING MD:  Dr. Ashok Cordia, CHIEF COMPLAINT:  Weakness    Brief History   64 y/o M admitted 6/20 with reports of a 3 week history of diarrhea and progressive weakness.  Work up notable for pallor, febrile neutropenia, severe anemia (hgb 4.6) and heme positive stools (on xarelto).  The patient was recently admitted from 5/28-6/12 for hypotension, new AFwRVR s/p DCCV x2 (on amiodarone, xarelto, brilinta), pancytopenia requiring multiple transfusions.   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 On BiPAP 24/7/ Wants to trial off bipap Took off for 5 minutes and was able to converse without significant distress. Wife at bedside.  K severely low  Mag < 2gm%.  Off levophed. Off precredex gt. On lasix, digoxin, amio tab, plavix and xaretlo. On hydrocort since 6/20 (not on chronic steroids other than during chemo cycles ).  Per onc cheduled for next chemo in 1 week  Consults:  Cardiology    Procedures:  RUE Dual Lumen PICC 5/18 >>   Significant Diagnostic Tests:    Micro Data:  COVID 6/20 >> negative  BCx2 6/20 >>  UC 6/20 >> negative  DUKGURKYH RVP 6/26 - Rhinovirus BC 6/26 >> Urine leg 6/26 -  Urine sttrep 6/26 - negative  Antimicrobials:  Flagyl 6/20 >> 6/24, 6/25 >>6/27 Vanco 6/20 >> 6/24, 6/25 >>6/27  Cefepime 6/20 >> 6/24, 6/25 >> (need to establish end point)     Interim history/subjective:   6/27 - - RVP = positive Rhinovirus.   Still on milrionone gtt. On oral -> midodrine, lasix, digoxin, amio tab, plavix and xaretlo. On hydrocort since 6/20 (not on chronic steroids other than during chemo cycles ) - reduced yeserday. Critically low phos repleted yesterday.  Respn: able to tolerate intermitten several hours off bipap yesterday. Got BiPAP QHS. This am-> now on HHFNC 20L and 30-50% fio2. Feeling better.  WBC better  And afebrile/low normal temp, PCT better  Denies anxiety - on remeron and zoloft, lamictal and keppra   Wife and daughter at bedside    Objective   Blood pressure (!) 92/59, pulse 85, temperature (!) 97.5 F (36.4 C), temperature source Axillary, resp. rate (!) 26, height 6' (1.829 m), weight 81.2 kg, SpO2 95 %.    FiO2 (%):  [50 %-100 %] 50 %   Intake/Output Summary (Last 24 hours) at 07/15/2019 1027 Last data filed at 07/15/2019 0800 Gross per 24 hour  Intake 1834.04 ml  Output 3800 ml  Net -1965.96 ml   Filed Weights   07/13/19 0500 07/14/19 0500 07/15/19 0425  Weight: 81.7 kg 81 kg 81.2 kg    General Appearance:  Looks lot better. Frail, cachectic, barrell chested,  Head:  Normocephalic, without obvious abnormality, atraumatic Eyes:  PERRL - yes, conjunctiva/corneas - muddy     Ears:  Normal external ear canals, both ears Nose:  G tube - no but has HHFNC Throat:  ETT TUBE - no , OG tube - no Neck:  Supple,  No enlargement/tenderness/nodules Lungs: Barrell chested. Improved resp distress. Occ wheeze bbut  better Heart:  S1 and S2 normal, no murmur, CVP - no.  Pressors - no Abdomen:  Soft, no masses, no organomegaly Genitalia / Rectal:  Not done Extremities:  Extremities- intact Skin:  ntact in exposed areas . Sacral area - not examined. Scattered bruises + Neurologic:  Sedation - none -> RASS - +1 . Moves all 4s - yes. CAM-ICU - neg . Orientation - x3 + . [facility] +      Resolved Hospital Problem list   Ciruclatoyr shock   Assessment & Plan:   Baseline: advanced copd and former smoker  Current: Acute Hypoxemic Respiratory Failure with diffuse pulmonary infiltrates -  POSITIVE RHINOVIRUS - likely etioloty. Other concern is acute on chronic s- CHF and neutropenic fever  07/15/2019 - improving hypoxemia and bipap dependence. Big improvement ? With time versus antibiotic rechallenge  Plan - o2 for pulse ox > 88% (needing HHFNC)  - scheduled pulmicort bid  - scheduled xopenex and atrovent q4h  - dc brovana  - bipap QHS and day prn   - intubate if declines    Acute Decompensated Systolic CHF  Hx AF, CAD s/p PCI, HLD, Chronic Combined Systolic / Diastolic CHF  On midodrine at baseline. SvO2 48 on 6/21, LVEF 35-40%   07/15/2019 - on milrionone gtt and oral meds   plan -continue midodrine, amiodarone, digoxin , plavix, xarelto, lasix - continue milrinone gtt  - cards following   Neutropenic Fever  Pancytopenia >> WBC bump 6/25 to 15.8  07/15/2019 - low temp 97+ without change. WBC improved after broad abx. Culture negative so far. Positive for rhinovirus pcr - likely admit problem   Plan Await  urine legionella  - continue abx but dc vanc and flagyl; - continue cefepime - total 7 days but can likely stop early based on course   Anemia - Rule out GI bleed, FOBT positive on admit   07/15/2019 - no overt bleed but hgb downdrift to < 8gm%. On plavix and xarelto  plan -trend CBC -PPI  -monitor closely for bleeding  -may need GI evaluation at some point given FOBT + on admit.   Currently not a candidate for procedure - - PRBC for hgb </= 6.9gm%    - exceptions are   -  if ACS susepcted/confirmed then transfuse for hgb </= 8.0gm%,  or    -  active bleeding with hemodynamic instability, then transfuse regardless of hemoglobin value   At at all times try to transfuse 1 unit prbc as possible with exception of active hemorrhage   Electrolyte imbalance  07/15/2019 - critically low phos repleted  Plan - Mag goal > 2 K Goal > 4 - normal phos goal - monitor closely   Diffuse Large Cell Lymphoma  Extensive disease involvement, s/p 2 cycles of chemotherapy with CHOP/rituximab, last on 6/11.  Thought per ONC to be a potentially  treatable condition.  N  6/27 - next chemo cycle due to week of 6/28  Plan -Per ONC - might have to wait past week of 07/16/19  -follow CBC   Pressure Ulcer  Sacral pressure ulcer, present on admit  plan -wound care per protocol  -mobilize   Hx Seizure   6/27 - no seizure since admit  plan -continue home lamictal, keppra   Severe Protein Calorie Malnutrition  -liberalize diet as poor intake.  Once improved, can return to heart healthy diet  Best practice:  Diet: Regular diet  Pain/Anxiety/Delirium protocol (if indicated): n/a  VAP protocol (if indicated): n/a  DVT prophylaxis: SCD's  GI prophylaxis: PPI Glucose control: n/a  Mobility: as tolerated  Code Status: Partial code - but yes for BiPAP and intubation  Family Communication: patieent, wife and daughte at bedside 07/15/19   Disposition: ICU     ATTESTATION & SIGNATURE   The patient Tony Saunders is critically ill with multiple organ systems failure and requires high complexity decision making for assessment and support, frequent evaluation and titration of therapies, application of advanced monitoring technologies and extensive interpretation of multiple databases.   Critical Care Time devoted to patient care services described in this note is  32   Minutes. This time reflects time of care of this signee Dr Brand Males. This critical care time does not reflect procedure time, or teaching time or supervisory time of PA/NP/Med student/Med Resident etc but could involve care discussion time     Dr. Brand Males, M.D., [facility].C.P Pulmonary and Critical Care Medicine Staff Physician [facility] Pager: [phone], If no answer or between  15:00h - 7:00h: call [phone]  07/15/2019 11:08 AM     LABS    PULMONARY Recent Labs  Lab 07/11/19 0514 07/12/19 0404 07/13/19 0205 07/14/19 0500 07/15/19 0349  O2SAT 87.6 89.9 74.6 73.7 69.2    CBC Recent Labs  Lab 07/13/19 0200 07/14/19 0500 07/15/19 0030  HGB 10.1* 8.1* 7.6*  HCT 32.0* 25.7* 24.4*  WBC 15.8* 7.3 8.8  PLT 84* 53* 55*    COAGULATION Recent Labs  Lab 07/08/19 1416 07/10/19 1258  INR 1.8* 1.2    CARDIAC  No results for input(s): TROPONINI in the last 168 hours. No results for input(s): PROBNP in the last 168 hours.   CHEMISTRY Recent Labs  Lab 07/08/19 1515 07/09/19 1144 07/12/19 1445 07/12/19 1445 07/13/19 0200 07/13/19 0200 07/13/19 1352 07/13/19 1352 07/13/19 1539 07/13/19 1539 07/14/19 0500 07/15/19 0030  NA  --    < > 139   < > 140  --  139  --  140  --  140 138  K  --    < > 2.1*   < > 3.3*   < > 7.5*   < > 3.8   < > 2.8* 3.5  CL  --    < > 99   < > 101  --  105  --  103  --  102 99  CO2  --    < > 29   < > 25  --  27  --  27  --  29 31  GLUCOSE  --    < > 185*   < > 162*  --  141*  --  122*  --  116* 113*  BUN  --    < > 24*   < > 25*  --  25*  --  27*  --  24* 22  CREATININE  --    < > 1.03   < > 1.06  --  1.05  --  0.98  --  0.93 0.90  CALCIUM  --    < > 6.5*   < > 7.2*  --  7.0*  --  7.1*  --  7.2* 7.0*  MG 1.4*  --  1.8  --  2.1  --   --   --   --   --  1.9 2.1  PHOS  --   --   --   --   --   --   --   --   --   --   --  1.0*   < > = values in this interval not displayed.    Estimated Creatinine Clearance: 93.4 mL/min (by C-G formula based on SCr of 0.9 mg/dL).   LIVER Recent Labs  Lab 07/08/19 1416 07/10/19 0634 07/10/19 1258  AST 23 17  --   ALT 23 24  --   ALKPHOS 44 50  --   BILITOT 0.8 1.0  --   PROT 4.1* 4.2*  --   ALBUMIN 2.0* 1.9*  --   INR 1.8*  --  1.2     INFECTIOUS Recent Labs  Lab 07/08/19 1415 07/08/19 1552 07/08/19 1709 07/13/19 1352 07/14/19 0500  LATICACIDVEN 2.7* 0.7  --   --   --   PROCALCITON  --   --  1.15 0.69 0.44     ENDOCRINE CBG (last 3)  No results for input(s): GLUCAP in the last 72 hours.       IMAGING x48h  - image(s) personally visualized  -   highlighted in bold DG CHEST PORT 1 VIEW  Result Date: 07/14/2019 CLINICAL DATA:  Respiratory failure EXAM: PORTABLE CHEST 1 VIEW COMPARISON:  July 13, 2019 FINDINGS: A right PICC line terminates in the central SVC. No pneumothorax. Diffuse bilateral pulmonary infiltrates are similar in the interval. No change in the cardiomediastinal silhouette. IMPRESSION: Bilateral pulmonary infiltrates are similar to slightly improved in the interval. Recommend follow-up to resolution. No other acute abnormalities or changes. Electronically Signed   By: Dorise Bullion III M.D   On: 07/14/2019 06:23

## 2019-07-15 NOTE — Progress Notes (Signed)
eLink Physician-Brief Progress Note Patient Name: Tony Saunders DOB: 04-Feb-1955 MRN: [id number]   Date of Service  07/15/2019  HPI/Events of Note  Hypokalemia / Hypophosphatemia - K+ = 3.5, PO4--- = 1.0 and Creatinine = 0.9.  eICU Interventions  Will replace K+ and PO4---     Intervention Category Major Interventions: Electrolyte abnormality - evaluation and management  Tony Saunders 07/15/2019, 3:38 AM

## 2019-07-15 NOTE — Progress Notes (Signed)
Mr. Ruan wife and daughter were bedside when I arrived. He looked concerned when I introduced myself but I assured him that I was just making rounds and no need to be otherwise concerned. He laughed and said he thought I knew something he didn't. He and his family were very pleasant. He said he did not need anything at the time and acknowledged that God has him. Please page if additional support is needed.  [place]   07/15/19 1800  Clinical Encounter Type  Visited With Patient and family together

## 2019-07-15 NOTE — Evaluation (Signed)
Physical Therapy Evaluation Patient Details Name: Tony Saunders MRN: [id number] DOB: 1956/01/18 Today's Date: 07/15/2019   History of Present Illness  Pt to ED 2* weakness and admitted with acute hypoxemic respiratory failure, acute decompensated, systolic CHF and severe pancytopenia 2* chemo and increasing L LE edema 2* ongoing Lymphoma.  Pt with hx of MI, seizure disorder, CHF, PAF - pt underwent Cardioversion 06/22/19  Clinical Impression  Pt admitted as above and presenting with functional mobility limitations 2* significant generalized weakness, balance deficits and SOB with min exertion.  Pt hopes to progress to dc home with assist of family but dependent on acute stay progress and available home assist may need to consider follow up rehab at SNF level to maximize IND and safety.    Follow Up Recommendations Home health PT    Equipment Recommendations  None recommended by PT    Recommendations for Other Services OT consult     Precautions / Restrictions Precautions Precautions: Fall Precaution Comments: very frail, painful left foot Restrictions Weight Bearing Restrictions: No      Mobility  Bed Mobility Overal bed mobility: Needs Assistance Bed Mobility: Supine to Sit     Supine to sit: Min assist;Mod assist     General bed mobility comments: Pt utilizing ed rail to self assist but requiring assist to manage LEs over EOB and to bring trunk to upright and complete rotation to sitting EOB  Transfers Overall transfer level: Needs assistance Equipment used: Rolling walker (2 wheeled) Transfers: Sit to/from [facility] Sit to Stand: Min assist;From elevated surface;+2 physical assistance;+2 safety/equipment Stand pivot transfers: Min assist;+2 physical assistance;+2 safety/equipment;From elevated surface       General transfer comment: assist to bring wt up and fwd and to balance in initial standing  Ambulation/Gait         Gait velocity:  decr   General Gait Details: Pt tolerated stand/pvt bed to chair only - ltd by dizziness and dyspnea  Stairs            Wheelchair Mobility    Modified Rankin (Stroke Patients Only)       Balance Overall balance assessment: Needs assistance;History of Falls Sitting-balance support: Feet supported;Bilateral upper extremity supported Sitting balance-Leahy Scale: Fair     Standing balance support: Bilateral upper extremity supported;During functional activity Standing balance-Leahy Scale: Poor                               Pertinent Vitals/Pain Pain Assessment: Faces Faces Pain Scale: Hurts a little bit Pain Location: left foot Pain Descriptors / Indicators: Sore Pain Intervention(s): Limited activity within patient's tolerance;Monitored during session    Home Living Family/patient expects to be discharged to:: Private residence Living Arrangements: Spouse/significant other Available Help at Discharge: Family;Available 24 hours/day Type of Home: House Home Access: Stairs to enter   [employer] of Steps: 1 Home Layout: Two level;1/2 bath on main level Home Equipment: Walker - standard;Wheelchair - manual      Prior Function Level of Independence: Needs assistance   Gait / Transfers Assistance Needed: Pt used walker with 4 "skids" and assist of spouse, reports he could get up stairs with assistance of children.  ADL's / Homemaking Assistance Needed: Sponge bathes on main level with assist of spouse  Comments: Pt uses walker in home and w/c for distance.  Single step into home with walker vs wc dependent on pt ability on given day  Hand Dominance   Dominant Hand: Right    Extremity/Trunk Assessment   Upper Extremity Assessment Upper Extremity Assessment: Generalized weakness    Lower Extremity Assessment Lower Extremity Assessment: Generalized weakness LLE Deficits / Details: noted edema L LE - spouse states improved since  admit       Communication   Communication: HOH  Cognition Arousal/Alertness: Awake/alert Behavior During Therapy: WFL for tasks assessed/performed Overall Cognitive Status: Within Functional Limits for tasks assessed                                        General Comments      Exercises     Assessment/Plan    PT Assessment Patient needs continued PT services  PT Problem List Decreased strength;Decreased activity tolerance;Decreased balance;Decreased mobility;Decreased knowledge of use of DME       PT Treatment Interventions DME instruction;Gait training;Stair training;Functional mobility training;Therapeutic activities;Therapeutic exercise;Patient/family education;Balance training    PT Goals (Current goals can be found in the Care Plan section)  Acute Rehab PT Goals Patient Stated Goal: to go home PT Goal Formulation: With patient Time For Goal Achievement: 07/07/19 Potential to Achieve Goals: Fair    Frequency Min 3X/week   Barriers to discharge        Co-evaluation               AM-PAC PT "6 Clicks" Mobility  Outcome Measure Help needed turning from your back to your side while in a flat bed without using bedrails?: A Little Help needed moving from lying on your back to sitting on the side of a flat bed without using bedrails?: A Lot Help needed moving to and from a bed to a chair (including a wheelchair)?: A Lot Help needed standing up from a chair using your arms (e.g., wheelchair or bedside chair)?: A Lot Help needed to walk in hospital room?: A Lot Help needed climbing 3-5 steps with a railing? : Total 6 Click Score: 12    End of Session Equipment Utilized During Treatment: Gait belt;Oxygen Activity Tolerance: Patient limited by fatigue Patient left: in chair;with call bell/phone within reach;with nursing/sitter in room;with family/visitor present Nurse Communication: Mobility status PT Visit Diagnosis: Difficulty in walking, not  elsewhere classified (R26.2);Muscle weakness (generalized) (M62.81)    Time: 3716-9678 PT Time Calculation (min) (ACUTE ONLY): 30 min   Charges:   PT Evaluation $PT Eval Moderate Complexity: [place] Pager [phone] Office [phone]   Zyra Parrillo 07/15/2019, 3:56 PM

## 2019-07-16 ENCOUNTER — Inpatient Hospital Stay: Payer: No Typology Code available for payment source

## 2019-07-16 ENCOUNTER — Inpatient Hospital Stay: Payer: No Typology Code available for payment source | Admitting: Oncology

## 2019-07-16 ENCOUNTER — Other Ambulatory Visit: Payer: Self-pay | Admitting: Diagnostic Neuroimaging

## 2019-07-16 DIAGNOSIS — J9690 Respiratory failure, unspecified, unspecified whether with hypoxia or hypercapnia: Secondary | ICD-10-CM

## 2019-07-16 DIAGNOSIS — I9589 Other hypotension: Secondary | ICD-10-CM

## 2019-07-16 LAB — CBC
HCT: 24.7 % — ABNORMAL LOW (ref 39.0–52.0)
Hemoglobin: 7.7 g/dL — ABNORMAL LOW (ref 13.0–17.0)
MCH: 28.1 pg (ref 26.0–34.0)
MCHC: 31.2 g/dL (ref 30.0–36.0)
MCV: 90.1 fL (ref 80.0–100.0)
Platelets: 58 10*3/uL — ABNORMAL LOW (ref 150–400)
RBC: 2.74 MIL/uL — ABNORMAL LOW (ref 4.22–5.81)
RDW: 19.4 % — ABNORMAL HIGH (ref 11.5–15.5)
WBC: 7.4 10*3/uL (ref 4.0–10.5)
nRBC: 0 % (ref 0.0–0.2)

## 2019-07-16 LAB — COMPREHENSIVE METABOLIC PANEL
ALT: 29 U/L (ref 0–44)
AST: 26 U/L (ref 15–41)
Albumin: 2 g/dL — ABNORMAL LOW (ref 3.5–5.0)
Alkaline Phosphatase: 70 U/L (ref 38–126)
Anion gap: 8 (ref 5–15)
BUN: 25 mg/dL — ABNORMAL HIGH (ref 8–23)
CO2: 33 mmol/L — ABNORMAL HIGH (ref 22–32)
Calcium: 7.2 mg/dL — ABNORMAL LOW (ref 8.9–10.3)
Chloride: 98 mmol/L (ref 98–111)
Creatinine, Ser: 0.92 mg/dL (ref 0.61–1.24)
GFR calc Af Amer: >60 mL/min (ref >60)
GFR calc non Af Amer: >60 mL/min (ref >60)
Glucose, Bld: 82 mg/dL (ref 70–99)
Potassium: 2.5 mmol/L — CL (ref 3.5–5.1)
Sodium: 139 mmol/L (ref 135–145)
Total Bilirubin: 0.5 mg/dL (ref 0.3–1.2)
Total Protein: 4.3 g/dL — ABNORMAL LOW (ref 6.5–8.1)

## 2019-07-16 LAB — BASIC METABOLIC PANEL
Anion gap: 13 (ref 5–15)
BUN: 28 mg/dL — ABNORMAL HIGH (ref 8–23)
CO2: 32 mmol/L (ref 22–32)
Calcium: 7.3 mg/dL — ABNORMAL LOW (ref 8.9–10.3)
Chloride: 96 mmol/L — ABNORMAL LOW (ref 98–111)
Creatinine, Ser: 0.94 mg/dL (ref 0.61–1.24)
GFR calc Af Amer: >60 mL/min (ref >60)
GFR calc non Af Amer: >60 mL/min (ref >60)
Glucose, Bld: 146 mg/dL — ABNORMAL HIGH (ref 70–99)
Potassium: 2.8 mmol/L — ABNORMAL LOW (ref 3.5–5.1)
Sodium: 141 mmol/L (ref 135–145)

## 2019-07-16 LAB — PROTIME-INR
INR: 1.3 — ABNORMAL HIGH (ref 0.8–1.2)
Prothrombin Time: 15.7 seconds — ABNORMAL HIGH (ref 11.4–15.2)

## 2019-07-16 LAB — COOXEMETRY PANEL
Carboxyhemoglobin: 1.9 % — ABNORMAL HIGH (ref 0.5–1.5)
Methemoglobin: 0.5 % (ref 0.0–1.5)
O2 Saturation: 59.2 %
Total hemoglobin: 7.9 g/dL — ABNORMAL LOW (ref 12.0–16.0)

## 2019-07-16 LAB — BRAIN NATRIURETIC PEPTIDE: B Natriuretic Peptide: 675.9 pg/mL — ABNORMAL HIGH (ref 0.0–100.0)

## 2019-07-16 LAB — PHOSPHORUS: Phosphorus: 2 mg/dL — ABNORMAL LOW (ref 2.5–4.6)

## 2019-07-16 LAB — LEGIONELLA PNEUMOPHILA SEROGP 1 UR AG: L. pneumophila Serogp 1 Ur Ag: NEGATIVE

## 2019-07-16 LAB — MAGNESIUM: Magnesium: 2.1 mg/dL (ref 1.7–2.4)

## 2019-07-16 MED ORDER — ROSUVASTATIN CALCIUM 5 MG PO TABS
5.0000 mg | ORAL_TABLET | Freq: Every day | ORAL | Status: DC
  Administered 2019-07-16 – 2019-07-24 (×9): 5 mg via ORAL
  Filled 2019-07-16 (×9): qty 1

## 2019-07-16 MED ORDER — POTASSIUM CHLORIDE 10 MEQ/50ML IV SOLN
10.0000 meq | INTRAVENOUS | Status: AC
  Administered 2019-07-16 – 2019-07-17 (×4): 10 meq via INTRAVENOUS
  Filled 2019-07-16 (×4): qty 50

## 2019-07-16 MED ORDER — POTASSIUM CHLORIDE CRYS ER 20 MEQ PO TBCR
40.0000 meq | EXTENDED_RELEASE_TABLET | Freq: Three times a day (TID) | ORAL | Status: AC
  Administered 2019-07-16 – 2019-07-17 (×2): 40 meq via ORAL
  Filled 2019-07-16 (×2): qty 2

## 2019-07-16 MED ORDER — HYDROCORTISONE NA SUCCINATE PF 100 MG IJ SOLR
50.0000 mg | Freq: Every day | INTRAMUSCULAR | Status: DC
  Administered 2019-07-17: 50 mg via INTRAVENOUS
  Filled 2019-07-16: qty 2

## 2019-07-16 MED ORDER — ENSURE ENLIVE PO LIQD
237.0000 mL | Freq: Two times a day (BID) | ORAL | Status: DC
  Administered 2019-07-16 – 2019-07-25 (×13): 237 mL via ORAL

## 2019-07-16 MED ORDER — POTASSIUM CHLORIDE CRYS ER 20 MEQ PO TBCR
40.0000 meq | EXTENDED_RELEASE_TABLET | Freq: Three times a day (TID) | ORAL | Status: AC
  Administered 2019-07-16 (×2): 40 meq via ORAL
  Filled 2019-07-16 (×2): qty 2

## 2019-07-16 NOTE — Progress Notes (Signed)
IP PROGRESS NOTE  Subjective:   Tony Saunders reports feeling better at this time.  He is able to tolerate nasal cannula oxygen better at this time.  He denies any cough, shortness of breath or dyspnea.  He denies any fevers or chills.  He does report being quite fatigued and exhausted at this time.  Lower extremity edema improved but still persists.    Objective:  Vital signs in last 24 hours: Temp:  [97.3 F (36.3 C)-97.7 F (36.5 C)] 97.4 F (36.3 C) (06/28 0800) Pulse Rate:  [76-92] 78 (06/28 0618) Resp:  [21-38] 27 (06/28 0618) BP: (96-117)/(44-59) 104/56 (06/28 0618) SpO2:  [91 %-100 %] 100 % (06/28 0844) FiO2 (%):  [40 %-50 %] 50 % (06/27 2117) Weight:  [178 lb 9.2 oz (81 kg)] 178 lb 9.2 oz (81 kg) (06/28 0500) Weight change: -7.1 oz (-0.2 kg) Last BM Date: 07/15/19  Intake/Output from previous day: 06/27 0701 - 06/28 0700 In: 2757 [P.O.:1860; I.V.:131.9; IV Piggyback:765.1] Out: 3825 [Urine:3825]    General appearance: Comfortable appearing without any discomfort Head: Normocephalic without any trauma Oropharynx: Mucous membranes are moist and pink without any thrush or ulcers. Eyes: Pupils are equal and round reactive to light. Lymph nodes: No cervical, supraclavicular, inguinal or axillary lymphadenopathy.   Heart:regular rate and rhythm.  S1 and S2.  2+ edema noted. Lung: Clear without any rhonchi or wheezes.  No dullness to percussion. Abdomin: Soft, nontender, nondistended with good bowel sounds.  No hepatosplenomegaly. Musculoskeletal: No joint deformity or effusion.  Full range of motion noted. Neurological: No deficits noted on motor, sensory and deep tendon reflex exam. Skin: Ecchymosis noted on his chest wall and upper extremities.   Lab Results: Recent Labs    07/15/19 0030 07/16/19 0629  WBC 8.8 7.4  HGB 7.6* 7.7*  HCT 24.4* 24.7*  PLT 55* 58*    BMET Recent Labs    07/15/19 0030 07/16/19 0629  NA 138 139  K 3.5 2.5*  CL 99 98  CO2 31  33*  GLUCOSE 113* 82  BUN 22 25*  CREATININE 0.90 0.92  CALCIUM 7.0* 7.2*    Studies/Results: No results found.  Medications: I have reviewed the patient's current medications.  Assessment/Plan:  64 year old with:  1.  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  He presented with a bulky disease above and below the diaphragm.  He completed 2 cycles of chemotherapy with CHOP with rituximab.  The natural course of this disease was reiterated again today with the patient and his wife was present at bedside.  The logistics of administration of cycle 3 of chemotherapy was reviewed.  He is technically scheduled this week to receive it although I do not think he is medically fit at this time.  Given his thrombocytopenia in addition to his cardiopulmonary status I have opted to hold off on chemotherapy this week.  We will reevaluate by the end of this week or potentially tentative date for cycle 3 of therapy.  Given the fact that he has received already 2 cycles extending the interval between cycle to cycle 3 beyond 3 weeks although not ideal would be reasonable at this time.     2.  Neutropenia: Resolved at this time.  He will receive growth factor support after each cycle of therapy.  3.  Anemia/thrombocytopenia: Related to chemotherapy.  His platelet count continues to be low but does not require any transfusion.  Retreatment with systemic chemotherapy might drive his platelets down  again which can cause dangerous bleeding given his anticoagulation.  4.  Hypotension: Blood pressure is overall improved.  5.  Respiratory failure: Related to fluid overload and rhinovirus infection.  Improved at this time although still requiring high oxygen supplementation.   6.  Prognosis: Remains guarded at this time.  Despite his slight clinical improvement he is still facing high burden of cancer although his disease remains treatable.  He has other comorbidities which has contributed  to his struggle to handle systemic therapy.  I agree with the current CODE STATUS and his wishes to continue with aggressive measures.     35  minutes were spent on this encounter.  More than 50% of the time was spent face-to-face with the patient and his wife.  The time was dedicated to reviewing his disease status, reviewing laboratory data, discussing treatment options as well as outlining future plan of care including prognosis.   LOS: 8 days   Zola Button 07/16/2019, 9:18 AM

## 2019-07-16 NOTE — Progress Notes (Signed)
NAME:  Tony Saunders, MRN:  [id number], DOB:  1955-03-16, LOS: 8 ADMISSION DATE:  07/08/2019, CONSULTATION DATE:  6/20 REFERRING MD:  Ashok Cordia, CHIEF COMPLAINT:  Weakness   Brief History   64 y/o male admitted on 6/20 with three weeks of diarrhea and weakness, noted on admission to have pancytopenia and fever.  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 On BiPAP 24/7/ Wants to trial off bipap Took off for 5 minutes and was able to converse without significant distress. Wife at bedside.  K severely low  Mag < 2gm%.  Off levophed. Off precredex gt. On lasix, digoxin, amio tab, plavix and xaretlo. On hydrocort since 6/20 (not on chronic steroids other than during chemo cycles ).  Per onc cheduled for next chemo in 1 week  6/27 - - RVP = positive Rhinovirus.   Still on milrionone gtt. On oral -> midodrine, lasix, digoxin, amio tab, plavix and xaretlo. On hydrocort since 6/20 (not on chronic steroids other than during chemo  cycles ) - reduced yeserday. Critically low phos repleted yesterday.  Respn: able to tolerate intermitten several hours off bipap yesterday. Got BiPAP QHS. This am-> now on HHFNC 20L and 30-50% fio2. Feeling better.  Consults:  Cardiology Palliative medicine   Procedures:  PICC 5/18 >   Significant Diagnostic Tests:  6/6 TTE > LVEF 35-40%, modeartely decreased function on left, global hypokinesis, RV systolic function normal, bilateral atrium dilated, severe MR, large pleural effusion  Micro Data:  6/20 SARS COV 2 > negative 6/20 blood >  6/21 urine culture 6/27 rhinovirus/enterovirus> positive  Antimicrobials:  6/20 cefepime >  6/20 flagyl > 6/26 (off 6/24) 6/20 vanc > 6/25  Interim history/subjective:  Hypokalemia> oral potassium ordered this morning Net negative Off BIPAP during daytime Breathing comfortably but still has some chest congestion   Objective   Blood pressure (!) 104/56, pulse 78, temperature (!) 97.4 F (36.3 C), temperature source Oral, resp. rate (!) 27, height 6' (1.829 m), weight 81 kg, SpO2 100 %. CVP:  [5 mmHg-6 mmHg] 6 mmHg  FiO2 (%):  [40 %-50 %] 50 %   Intake/Output Summary (Last 24 hours) at 07/16/2019 1003 Last data filed at 07/16/2019 0600 Gross per 24 hour  Intake 1845.06 ml  Output 2825 ml  Net -979.94 ml   Filed Weights   07/14/19 0500 07/15/19 0425 07/16/19 0500  Weight: 81 kg 81.2 kg 81 kg    Examination:  General:  Chronically ill  appearing, cachectic male resting comfortably in bed, temporal wasting HENT: NCAT OP clear PULM: Coarse B, normal effort CV: Irreg irreg, no mgr GI: BS+, soft, nontender MSK: normal bulk and tone Derm: thin skin, bruising noted Neuro: awake, alert, no distress, MAEW   6/26 CXR images personally reviewed: diffuse bilateral airspace disease, R>L effusion  6/28 COOX (SvO2) 59%  6/27 SARS COV 2 IgG neg  Resolved Hospital Problem list     Assessment & Plan:  Acute rhinovirus pneumonia causing  acute on chronic respiratory failure with hypoxemia> improved but still with chest congestion and still with persistent wheezing 6/28 Stressed importance of getting out of bed R pleural effusion Acute pulmonary edema from acute decompensated heart failure COPD baseline Continue pulmicort for now, scheduled xopenex and atrovent  BIPAP off, use prn Incentive spirometry Flutter valve Repeat CXR in AM, consider thoracentesis Continue diuresis  Acute decompensated systolic heart failure History of CAD, Hyperlipidemia Milrinone per cardiology> from my standpoint would maintain for a few more days Continue plavix, lasix Midodrine (home medication) Wean off hydrocortisone> reduce dose to 50mg  daily today  Atrial fibrilation Tele Amiodarone, digoxine Xarelto  Pancytopenia in setting of chemotherapy use for lymphoma> improving counts No evidence of brisk bleeding but fecal occult blood test positive on stool card on admission Monitor for bleeding Transfuse PRBC for Hgb < 7 gm/dL Outpatient GI work up  Diffuse large cell lymphoma Hold immunosuppressive cancer treatment this week given frailty  Pressure ulcer present on admission Wound care  Seizure disorder Continue home lamictal, keppra  Severe protein calorie malnutrition Continue regular diet Dietician consult  Best practice:  Diet: dietary to see today Pain/Anxiety/Delirium protocol (if indicated): n/a VAP protocol (if indicated): n/a DVT prophylaxis: SCD GI prophylaxis: n/a Glucose control: monitor  Mobility: out of bed Code Status: short term intubation OK, no CPR if cardiac arrest Family Communication: updated wife bedside Disposition: remain in ICU  Labs   CBC: Recent Labs  Lab 07/12/19 0404 07/13/19 0200 07/14/19 0500 07/15/19 0030 07/16/19 0629  WBC 4.7 15.8* 7.3 8.8 7.4  HGB 8.2* 10.1* 8.1* 7.6* 7.7*  HCT 25.2* 32.0* 25.7* 24.4* 24.7*  MCV 86.3 88.4 88.6 89.4 90.1  PLT 41* 84* 53* 55* 58*     Basic Metabolic Panel: Recent Labs  Lab 07/12/19 1445 07/12/19 1445 07/13/19 0200 07/13/19 0200 07/13/19 1352 07/13/19 1539 07/14/19 0500 07/15/19 0030 07/16/19 0629  NA 139   < > 140   < > 139 140 140 138 139  K 2.1*   < > 3.3*   < > 7.5* 3.8 2.8* 3.5 2.5*  CL 99   < > 101   < > 105 103 102 99 98  CO2 29   < > 25   < > 27 27 29 31  33*  GLUCOSE 185*   < > 162*   < > 141* 122* 116* 113* 82  BUN 24*   < > 25*   < > 25* 27* 24* 22 25*  CREATININE 1.03   < > 1.06   < > 1.05 0.98 0.93 0.90 0.92  CALCIUM 6.5*   < > 7.2*   < > 7.0* 7.1* 7.2* 7.0* 7.2*  MG 1.8  --  2.1  --   --   --  1.9 2.1 2.1  PHOS  --   --   --   --   --   --   --  1.0* 2.0*   < > = values in this interval not  displayed.   GFR: Estimated Creatinine Clearance: 91.4 mL/min (by C-G formula based on SCr of 0.92 mg/dL). Recent Labs  Lab 07/13/19 0200 07/13/19 1352 07/14/19 0500 07/15/19 0030 07/16/19 0629  PROCALCITON  --  0.69 0.44  --   --   WBC 15.8*  --  7.3 8.8 7.4    Liver Function Tests: Recent Labs  Lab 07/10/19 0634 07/16/19 0629  AST 17 26  ALT 24 29  ALKPHOS 50 70  BILITOT 1.0 0.5  PROT 4.2* 4.3*  ALBUMIN 1.9* 2.0*   No results for input(s): LIPASE, AMYLASE in the last 168 hours. No results for input(s): AMMONIA in the last 168 hours.  ABG    Component Value Date/Time   HCO3 22.0 03/29/2019 1012   TCO2 23 03/29/2019 1012   ACIDBASEDEF 2.0 03/29/2019 1012   O2SAT 59.2 07/16/2019 0629     Coagulation Profile: Recent Labs  Lab 07/10/19 1258 07/16/19 0629  INR 1.2 1.3*    Cardiac Enzymes: No results for input(s): CKTOTAL, CKMB, CKMBINDEX, TROPONINI in the last 168 hours.  HbA1C: Hgb A1c MFr Bld  Date/Time Value Ref Range Status  03/27/2019 07:06 AM 5.3 4.8 - 5.6 % Final    Comment:    (NOTE) Pre diabetes:          5.7%-6.4% Diabetes:              >6.4% Glycemic control for   <7.0% adults with diabetes     CBG: No results for input(s): GLUCAP in the last 168  hours.   Critical care time: 35 minutes     Roselie Awkward, MD [facility] Pager: [phone] Cell: [phone] If no response, call [phone]

## 2019-07-16 NOTE — Progress Notes (Signed)
eLink Physician-Brief Progress Note Patient Name: Tony Saunders DOB: 08-26-1955 MRN: [id number]   Date of Service  07/16/2019  HPI/Events of Note  K 2.8, Cr 0.9  eICU Interventions  Ordered 40 mEq KCl PO x2 doses, as well as 40 mEq KCl IV (via PICC). Recheck level in AM.     Intervention Category Intermediate Interventions: Electrolyte abnormality - evaluation and management  Marily Lente Samanta Gal 07/16/2019, 8:59 PM

## 2019-07-16 NOTE — Progress Notes (Signed)
Pt currently on 3 LPM Santel and tolerating well at this time.  Pt in no noted distress, BIPAP not indicated at this time.  RT to monitor and assess as needed.

## 2019-07-16 NOTE — Progress Notes (Signed)
Nutrition Follow-up  DOCUMENTATION CODES:   Severe malnutrition in context of chronic illness  INTERVENTION:   Ensure Enlive po BID, each supplement provides 350 kcal and 20 grams of protein  Continue Boost Breeze po BID, each supplement provides 250 kcal and 9 grams of protein  Continue 73ml Pro-stat TID, each supplement provides 100 kcal and 15 grams of protein  Continue MVI daily   NUTRITION DIAGNOSIS:   Severe Malnutrition related to chronic illness (cancer and cancer related treatments) as evidenced by severe fat depletion, severe muscle depletion, edema.   GOAL:   Patient will meet greater than or equal to 90% of their needs  Being addressed with supplements.   MONITOR:   PO intake, Supplement acceptance, Diet advancement, Labs, Weight trends, Skin  REASON FOR ASSESSMENT:   Malnutrition Screening Tool    ASSESSMENT:   64 year-old male with medical hx of CHF, large B-cell lymphoma, CAD s/p PCI, MI, afib, seizures. He quit smoking 17 months ago. He presented to the ED on 6/20 with 3 week hx of diarrhea, progressive weakness, and profound leg weakness and pallor. He was found to have febrile neutropenia and severe anemia.  Per MD, pt will not receive cycle 3 of chemotherapy this week due to his thrombocytopenia and his cardiopulmonary status.  Pt states his appetite is "so-so." He reports that he does not enjoy the hospital food, but his family is bringing him food from elsewhere at least once per day and he is eating that food well. PTA, pt was eating 2 balanced meals per day with some snacks and 1-2 protein drinks. Pt agreeable to continued use of oral nutrition supplements.   No PO intake documented.  UOP: 3,822ml x24 hours I/O: -5,618ml since admit  Labs: K+ 2.5 (L), Phosphorus 2.0 (L) Medications: Boost Breeze po BID, 38ml Pro-stat TID, Lasix, Remeron, MVI, Protonix, Klor-con  NUTRITION - FOCUSED PHYSICAL EXAM:    Most Recent Value  Orbital Region  Moderate depletion  Upper Arm Region Severe depletion  Thoracic and Lumbar Region Severe depletion  Buccal Region Moderate depletion  Temple Region Severe depletion  Clavicle Bone Region Severe depletion  Clavicle and Acromion Bone Region Severe depletion  Scapular Bone Region Severe depletion  Dorsal Hand Moderate depletion  Patellar Region Moderate depletion  Anterior Thigh Region Mild depletion  Posterior Calf Region Mild depletion  Edema (RD Assessment) Severe  Hair Reviewed  Eyes Reviewed  Mouth Reviewed  Skin Reviewed  Nails Reviewed       Diet Order:   Diet Order            Diet regular Room service appropriate? Yes; Fluid consistency: Thin; Fluid restriction: 1500 mL Fluid  Diet effective now                 EDUCATION NEEDS:   Not appropriate for education at this time  Skin:  Skin Assessment: Skin Integrity Issues: Skin Integrity Issues:: Stage I, Stage II Stage I: sacrum Stage II: L scrotum, L buttocks  Last BM:  6/27 type 5  Height:   Ht Readings from Last 1 Encounters:  07/08/19 6' (1.829 m)    Weight:   Wt Readings from Last 1 Encounters:  07/16/19 81 kg   BMI:  Body mass index is 24.22 kg/m.  Estimated Nutritional Needs:   Kcal:  2200-2500 kcal  Protein:  120-135 grams  Fluid:  >/= 2.4 L/day    Larkin Ina, MS, RD, LDN RD pager number and weekend/on-call pager number located  in [place].

## 2019-07-16 NOTE — Progress Notes (Addendum)
Progress Note  Patient Name: Tony Saunders Date of Encounter: 07/16/2019  [facility] Cardiologist: Glenetta Hew, MD   Subjective   Did not sleep well. Breathing slightly better as he is not requiring positive pressure ventilation, but still on Harmon and feels short of breath. No chest pain.   Inpatient Medications    Scheduled Meds: . amiodarone  200 mg Oral Daily  . budesonide (PULMICORT) nebulizer solution  0.5 mg Nebulization BID  . chlorhexidine  15 mL Mouth Rinse BID  . Chlorhexidine Gluconate Cloth  6 each Topical Daily  . clopidogrel  75 mg Oral Daily  . digoxin  0.125 mg Oral Daily  . feeding supplement  1 Container Oral BID BM  . feeding supplement (PRO-STAT SUGAR FREE 64)  30 mL Oral TID BM  . furosemide  40 mg Intravenous Q12H  . [START ON 07/17/2019] hydrocortisone sodium succinate  50 mg Intravenous Daily  . ipratropium  0.5 mg Inhalation Q4H  . lamoTRIgine  100 mg Oral BID  . levalbuterol  1.25 mg Nebulization Q4H  . levETIRAcetam  750 mg Oral BID  . mouth rinse  15 mL Mouth Rinse q12n4p  . midodrine  5 mg Oral TID WC  . mirtazapine  15 mg Oral QHS  . multivitamin with minerals  1 tablet Oral Daily  . pantoprazole (PROTONIX) IV  40 mg Intravenous Q24H  . potassium chloride  40 mEq Oral TID  . rivaroxaban  20 mg Oral Q breakfast  . rosuvastatin  5 mg Oral QHS  . sertraline  25 mg Oral Daily  . sodium chloride flush  10-40 mL Intracatheter Q12H   Continuous Infusions: . sodium chloride Stopped (07/15/19 0422)  . ceFEPime (MAXIPIME) IV Stopped (07/16/19 0856)  . milrinone 0.25 mcg/kg/min (07/16/19 1000)   PRN Meds: sodium chloride, docusate sodium, lip balm, polyethylene glycol, sodium chloride flush   Vital Signs    Vitals:   07/16/19 0800 07/16/19 0844 07/16/19 0900 07/16/19 1000  BP: (!) 108/53  (!) 109/48 (!) 104/44  Pulse: 87  88 87  Resp: (!) 28  (!) 36 (!) 31  Temp: (!) 97.4 F (36.3 C)     TempSrc: Oral     SpO2: 100% 100% 98% 94%   Weight:      Height:        Intake/Output Summary (Last 24 hours) at 07/16/2019 1059 Last data filed at 07/16/2019 1000 Gross per 24 hour  Intake 1979.35 ml  Output 2825 ml  Net -845.65 ml   Last 3 Weights 07/16/2019 07/15/2019 07/14/2019  Weight (lbs) 178 lb 9.2 oz 179 lb 0.2 oz 178 lb 9.2 oz  Weight (kg) 81 kg 81.2 kg 81 kg      Telemetry    Atrial fibrillation with occasional PVCs - Personally Reviewed  ECG    07/08/19 Sinus tachycardia at 106 bpm - Personally Reviewed  Physical Exam   GEN: Frail appearing. No acute distress.  Trion in place Neck: No JVD Cardiac: irregularly irregular, no rubs, or gallops appreciated. 2/6 SM Respiratory: coarse throughout but no increased work of breathing GI: Soft, nontender, non-distended  MS: 2+ bilateral LE edema; warm to touch Neuro:  Nonfocal  Psych: Normal affect   Labs    High Sensitivity Troponin:   Recent Labs  Lab 07/08/19 1416 07/08/19 1558  TROPONINIHS 96* 96*      Chemistry Recent Labs  Lab 07/10/19 0634 07/11/19 0514 07/14/19 0500 07/15/19 0030 07/16/19 0629  NA 137   < >  140 138 139  K 2.8*   < > 2.8* 3.5 2.5*  CL 101   < > 102 99 98  CO2 25   < > 29 31 33*  GLUCOSE 108*   < > 116* 113* 82  BUN 20   < > 24* 22 25*  CREATININE 0.91   < > 0.93 0.90 0.92  CALCIUM 7.0*   < > 7.2* 7.0* 7.2*  PROT 4.2*  --   --   --  4.3*  ALBUMIN 1.9*  --   --   --  2.0*  AST 17  --   --   --  26  ALT 24  --   --   --  29  ALKPHOS 50  --   --   --  70  BILITOT 1.0  --   --   --  0.5  GFRNONAA >60   < > >60 >60 >60  GFRAA >60   < > >60 >60 >60  ANIONGAP 11   < > 9 8 8    < > = values in this interval not displayed.     Hematology Recent Labs  Lab 07/14/19 0500 07/15/19 0030 07/16/19 0629  WBC 7.3 8.8 7.4  RBC 2.90* 2.73* 2.74*  HGB 8.1* 7.6* 7.7*  HCT 25.7* 24.4* 24.7*  MCV 88.6 89.4 90.1  MCH 27.9 27.8 28.1  MCHC 31.5 31.1 31.2  RDW 18.2* 18.7* 19.4*  PLT 53* 55* 58*    BNP Recent Labs  Lab  07/13/19 0200 07/14/19 0500 07/16/19 0629  BNP 608.0* 608.8* 675.9*     DDimer No results for input(s): DDIMER in the last 168 hours.   Radiology    No results found.  Cardiac Studies   Echo 06/24/19 1. Left ventricular ejection fraction, by estimation, is 35 to 40%. The  left ventricle has moderately decreased function. The left ventricle  demonstrates global hypokinesis. The left ventricular internal cavity size  was mildly dilated. Left ventricular  diastolic parameters are indeterminate.  2. Right ventricular systolic function is normal. The right ventricular  size is normal. Tricuspid regurgitation signal is inadequate for assessing  PA pressure.  3. Left atrial size was mildly dilated.  4. Right atrial size was mildly dilated.  5. The mitral valve is normal in structure. Severe mitral valve  regurgitation from teathering of posterior MV leaflet. No evidence of  mitral stenosis.  6. The aortic valve is tricuspid. Aortic valve regurgitation is not  visualized. Mild to moderate aortic valve sclerosis/calcification is  present, without any evidence of aortic stenosis.  7. The inferior vena cava is normal in size with greater than 50%  respiratory variability, suggesting right atrial pressure of 3 mmHg.  8. Large pleural effusion in the left lateral region. No pericardial  effusion.   Patient Profile     64 y.o. male with CAD s/p recent PCI in all three coronary distributions (last 03/2019), persistent atrial fib/flutter s/p multiple DCCV (last 03/27/74), chronic systolic and diastolic heart failure (LVEF 30-35%), moderate-severe MR, and recently diagnosed diffuse large cell lymphoma s/p 2 cycles of R-CHOP admitted with weakness, pancytopenia/neutropenia, and acute on chronic heart failure.   Assessment & Plan    Acute on chronic systolic and diastolic heart failure, mitral regurgitation: -EF 35-40%, severe MR on recent echo -admission weight 77.1 kg, weight today 81  kg. Charted as net negative 5L but this does not match weight trend. -has required inotropic support and bipap this admission. Remains on milrinone  0.25 mcg/kg/min for inotropic support. -diuresis limited by hypotension. Requires midodrine at baseline, continued. -on lasix 40 mg IV q12 hours. Remains volume overloaded. Aim for 1-2 L negative daily -Coox 59% today, down from 69-74 previously. Appears warm on exam. Would continue milrinone, check daily Coox. Coox on inotrope consistent with cardiogenic shock. -would aim for accurate I/O, daily weights. Added strict fluid restriction today. Allow for regular diet (not heart healthy/sodium restricted) given protein-calorie malnutrition  Persistent atrial fibrillation and atrial flutter -remains on rivaroxaban, monitor carefully given low platelet count -continue amiodarone 200 mg BID -continue digoxin 0.125 mg daily -CHA2DS2/VAS Stroke Risk Points=2 -aim for K>4, Mg>2 -severe hypokalemia of 2.5 today. Replete aggressively.  History of three vessel CAD, with recent PCI 03/2019:  -no chest pain -continue clopidogrel, on rivaroxaban so no aspirin -restarting rosuvastatin today. AST/ALT normal today.  Anemia, thrombocytopenia: secondary to chemotherapy -on clopidogrel given recent PCI -on rivaroxaban as above -monitor for bleeding closely -platelets 58k, Hgb 7.7 today  Febrile neutropenia: -per primary team  Diffuse large B cell lymphoma:  -diffuse, extensive disease -per oncology and primary team. Per notes, due for chemo this week, but this is on hold while acutely ill -palliative care consulted 07/15/19 for assistance with recommendations  CRITICAL CARE Patient is critically ill with multiple organ systems affected and requires high complexity decision making. Total critical care time: 40 minutes. This time includes gathering of history, evaluation of patient's response to treatment, examination of patient, review of laboratory and  imaging studies, and coordination with consultants.   For questions or updates, please contact [place] Please consult [email or web address] for contact info under     Signed, Buford Dresser, MD  07/16/2019, 10:59 AM

## 2019-07-17 ENCOUNTER — Inpatient Hospital Stay (HOSPITAL_COMMUNITY): Payer: No Typology Code available for payment source

## 2019-07-17 DIAGNOSIS — I2510 Atherosclerotic heart disease of native coronary artery without angina pectoris: Secondary | ICD-10-CM

## 2019-07-17 LAB — CBC WITH DIFFERENTIAL/PLATELET
Abs Immature Granulocytes: 0.45 10*3/uL — ABNORMAL HIGH (ref 0.00–0.07)
Basophils Absolute: 0 10*3/uL (ref 0.0–0.1)
Basophils Relative: 0 %
Eosinophils Absolute: 0 10*3/uL (ref 0.0–0.5)
Eosinophils Relative: 0 %
HCT: 25.2 % — ABNORMAL LOW (ref 39.0–52.0)
Hemoglobin: 7.8 g/dL — ABNORMAL LOW (ref 13.0–17.0)
Immature Granulocytes: 6 %
Lymphocytes Relative: 1 %
Lymphs Abs: 0.1 10*3/uL — ABNORMAL LOW (ref 0.7–4.0)
MCH: 28.3 pg (ref 26.0–34.0)
MCHC: 31 g/dL (ref 30.0–36.0)
MCV: 91.3 fL (ref 80.0–100.0)
Monocytes Absolute: 0.3 10*3/uL (ref 0.1–1.0)
Monocytes Relative: 4 %
Neutro Abs: 6.2 10*3/uL (ref 1.7–7.7)
Neutrophils Relative %: 89 %
Platelets: 66 10*3/uL — ABNORMAL LOW (ref 150–400)
RBC: 2.76 MIL/uL — ABNORMAL LOW (ref 4.22–5.81)
RDW: 20.1 % — ABNORMAL HIGH (ref 11.5–15.5)
WBC: 7 10*3/uL (ref 4.0–10.5)
nRBC: 0 % (ref 0.0–0.2)

## 2019-07-17 LAB — COMPREHENSIVE METABOLIC PANEL
ALT: 28 U/L (ref 0–44)
AST: 23 U/L (ref 15–41)
Albumin: 2 g/dL — ABNORMAL LOW (ref 3.5–5.0)
Alkaline Phosphatase: 69 U/L (ref 38–126)
Anion gap: 11 (ref 5–15)
BUN: 24 mg/dL — ABNORMAL HIGH (ref 8–23)
CO2: 29 mmol/L (ref 22–32)
Calcium: 7.1 mg/dL — ABNORMAL LOW (ref 8.9–10.3)
Chloride: 97 mmol/L — ABNORMAL LOW (ref 98–111)
Creatinine, Ser: 0.81 mg/dL (ref 0.61–1.24)
GFR calc Af Amer: >60 mL/min (ref >60)
GFR calc non Af Amer: >60 mL/min (ref >60)
Glucose, Bld: 136 mg/dL — ABNORMAL HIGH (ref 70–99)
Potassium: 5.2 mmol/L — ABNORMAL HIGH (ref 3.5–5.1)
Sodium: 137 mmol/L (ref 135–145)
Total Bilirubin: 0.7 mg/dL (ref 0.3–1.2)
Total Protein: 4.3 g/dL — ABNORMAL LOW (ref 6.5–8.1)

## 2019-07-17 LAB — COOXEMETRY PANEL
Carboxyhemoglobin: 2.3 % — ABNORMAL HIGH (ref 0.5–1.5)
Methemoglobin: 0.3 % (ref 0.0–1.5)
O2 Saturation: 79.3 %
Total hemoglobin: 7.5 g/dL — ABNORMAL LOW (ref 12.0–16.0)

## 2019-07-17 LAB — PHOSPHORUS
Phosphorus: 1.3 mg/dL — ABNORMAL LOW (ref 2.5–4.6)
Phosphorus: 2.9 mg/dL (ref 2.5–4.6)

## 2019-07-17 MED ORDER — SODIUM PHOSPHATES 45 MMOLE/15ML IV SOLN
45.0000 mmol | Freq: Once | INTRAVENOUS | Status: AC
  Administered 2019-07-17: 45 mmol via INTRAVENOUS
  Filled 2019-07-17: qty 15

## 2019-07-17 MED ORDER — LOPERAMIDE HCL 2 MG PO CAPS
2.0000 mg | ORAL_CAPSULE | ORAL | Status: DC | PRN
  Administered 2019-07-19: 4 mg via ORAL
  Administered 2019-07-20: 2 mg via ORAL
  Administered 2019-07-21: 4 mg via ORAL
  Filled 2019-07-17 (×4): qty 2

## 2019-07-17 MED ORDER — HYDROCORTISONE NA SUCCINATE PF 100 MG IJ SOLR
25.0000 mg | Freq: Every day | INTRAMUSCULAR | Status: AC
  Administered 2019-07-18: 25 mg via INTRAVENOUS
  Filled 2019-07-17: qty 2

## 2019-07-17 NOTE — Progress Notes (Signed)
Progress Note  Patient Name: Tony Saunders Date of Encounter: 07/17/2019  Primary Cardiologist: Glenetta Hew, MD   Subjective   Feeling slightly better today, though still fatigued. Breathing still coarse but thinks it is improving. Plans to get to chair and possibly ambulate today. Eating food brought in by his wife as he does not tolerate the hospital food well. Sleep continues to be difficult.  Inpatient Medications    Scheduled Meds: . amiodarone  200 mg Oral Daily  . budesonide (PULMICORT) nebulizer solution  0.5 mg Nebulization BID  . chlorhexidine  15 mL Mouth Rinse BID  . Chlorhexidine Gluconate Cloth  6 each Topical Daily  . clopidogrel  75 mg Oral Daily  . digoxin  0.125 mg Oral Daily  . feeding supplement  1 Container Oral BID BM  . feeding supplement (ENSURE ENLIVE)  237 mL Oral BID  . feeding supplement (PRO-STAT SUGAR FREE 64)  30 mL Oral TID BM  . furosemide  40 mg Intravenous Q12H  . hydrocortisone sodium succinate  50 mg Intravenous Daily  . ipratropium  0.5 mg Inhalation Q4H  . lamoTRIgine  100 mg Oral BID  . levalbuterol  1.25 mg Nebulization Q4H  . levETIRAcetam  750 mg Oral BID  . mouth rinse  15 mL Mouth Rinse q12n4p  . midodrine  5 mg Oral TID WC  . mirtazapine  15 mg Oral QHS  . multivitamin with minerals  1 tablet Oral Daily  . pantoprazole (PROTONIX) IV  40 mg Intravenous Q24H  . rivaroxaban  20 mg Oral Q breakfast  . rosuvastatin  5 mg Oral QHS  . sertraline  25 mg Oral Daily  . sodium chloride flush  10-40 mL Intracatheter Q12H   Continuous Infusions: . sodium chloride Stopped (07/15/19 0422)  . milrinone 0.125 mcg/kg/min (07/17/19 1011)  . sodium phosphate  Dextrose 5% IVPB 44 mL/hr at 07/17/19 1000   PRN Meds: sodium chloride, docusate sodium, lip balm, polyethylene glycol, sodium chloride flush   Vital Signs    Vitals:   07/17/19 0828 07/17/19 0841 07/17/19 0900 07/17/19 1000  BP:   (!) 125/52 (!) 114/53  Pulse:   86 88    Resp:   (!) 28 (!) 21  Temp: 97.8 F (36.6 C)     TempSrc: Oral     SpO2:  98% 98% 100%  Weight:      Height:        Intake/Output Summary (Last 24 hours) at 07/17/2019 1031 Last data filed at 07/17/2019 1000 Gross per 24 hour  Intake 602.85 ml  Output 3650 ml  Net -3047.15 ml   Filed Weights   07/15/19 0425 07/16/19 0500 07/17/19 0430  Weight: 81.2 kg 81 kg 77.8 kg    Telemetry    Atrial fibrillation - Personally Reviewed  ECG    No new tracings - Personally Reviewed  Physical Exam   GEN: Frail appearing gentleman, cachectic Neck: No JVD visible Cardiac: irregularly irregular, no rubs or gallops. 2/6 SM  Respiratory: coarse throughout with rales at bases GI: Soft, nontender, non-distended  MS: 2+ bilateral LE edema; warm Neuro:  Nonfocal, moving all extremities spontaneously Psych: Normal affect   Labs    Chemistry Recent Labs  Lab 07/16/19 0629 07/16/19 1738 07/17/19 0428  NA 139 141 137  K 2.5* 2.8* 5.2*  CL 98 96* 97*  CO2 33* 32 29  GLUCOSE 82 146* 136*  BUN 25* 28* 24*  CREATININE 0.92 0.94 0.81  CALCIUM 7.2*  7.3* 7.1*  PROT 4.3*  --  4.3*  ALBUMIN 2.0*  --  2.0*  AST 26  --  23  ALT 29  --  28  ALKPHOS 70  --  69  BILITOT 0.5  --  0.7  GFRNONAA >60 >60 >60  GFRAA >60 >60 >60  ANIONGAP 8 13 11      Hematology Recent Labs  Lab 07/15/19 0030 07/16/19 0629 07/17/19 0428  WBC 8.8 7.4 7.0  RBC 2.73* 2.74* 2.76*  HGB 7.6* 7.7* 7.8*  HCT 24.4* 24.7* 25.2*  MCV 89.4 90.1 91.3  MCH 27.8 28.1 28.3  MCHC 31.1 31.2 31.0  RDW 18.7* 19.4* 20.1*  PLT 55* 58* 66*    Cardiac EnzymesNo results for input(s): TROPONINI in the last 168 hours. No results for input(s): TROPIPOC in the last 168 hours.   BNP Recent Labs  Lab 07/13/19 0200 07/14/19 0500 07/16/19 0629  BNP 608.0* 608.8* 675.9*     DDimer No results for input(s): DDIMER in the last 168 hours.   Radiology    No results found.  Cardiac Studies   Echo 06/24/19 1. Left  ventricular ejection fraction, by estimation, is 35 to 40%. The  left ventricle has moderately decreased function. The left ventricle  demonstrates global hypokinesis. The left ventricular internal cavity size  was mildly dilated. Left ventricular  diastolic parameters are indeterminate.  2. Right ventricular systolic function is normal. The right ventricular  size is normal. Tricuspid regurgitation signal is inadequate for assessing  PA pressure.  3. Left atrial size was mildly dilated.  4. Right atrial size was mildly dilated.  5. The mitral valve is normal in structure. Severe mitral valve  regurgitation from teathering of posterior MV leaflet. No evidence of  mitral stenosis.  6. The aortic valve is tricuspid. Aortic valve regurgitation is not  visualized. Mild to moderate aortic valve sclerosis/calcification is  present, without any evidence of aortic stenosis.  7. The inferior vena cava is normal in size with greater than 50%  respiratory variability, suggesting right atrial pressure of 3 mmHg.  8. Large pleural effusion in the left lateral region. No pericardial  effusion.   Patient Profile     64 y.o. 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   Assessment & Plan    1. Acute on chronic combined CHF with severe mitral regurgitation: Echo this admission with EF 35-40% with severe MR, previously EF 30-35% with moderate-severe MR. He has required inotropic support and BiPAP this admission. Diuresis has been limited by hypotension, though he has tolerated IV lasix 40mg  BID with UOP net -2.5L in the past 24 hours with net -8L this admission.  Weight is down to 171lbs today from 178lbs yesterday and 183lbs on admission. Allowing permissive dietary indiscretion given protein-calorie malnutrition, though strict fluid restrictions added yesterday. Coox is up to 79.3 today from 59.2 yesterday.  - Continue IV lasix 40mg  BID - Will decrease milrinone today to 0.125 mcg/kg/min. Will do gradual wean over several days. Continue to follow coox - Continue digoxin - Continue to monitor strict I&Os and daily weights - Continue to monitor electrolytes closely to maintain K >4, Mg >2 - K is 5.2 today, up from 2.8 yesterday. Will continue  to monitor at this time  2. Persistent atrial fibrillation: rates are stable this admission - Continue amiodarone for rhythm control - Continue digoxin for rate control - Continue xarelto for stroke ppx  3. Multivessel CAD s/p PCI to all 3 major vessels: last intervention 03/2019. Thankfully no chest pain complaints. Not on aspirin given need for anticoagulation for #2. - Continue plavix and statin (restarted yesterday given normalization of LFTs)  4. Anemia/thrombocytopenia: 2/2 chemotherapy. PLT stable at 66 and Hgb stable at 7.8 today. No complaints of bleeding - Continue to monitor closely.  5. Diffuse large B cell lymphoma: diffuse, extensive disease. On chemotherapy, though delayed this week given ongoing acute illness. Palliative care is assisting with [place] discussions.  - Continue management per primary team, oncology, and palliative care  6. Hypokalemia: K up to 5.2 today from 2.8 yesterday.  - Continue to monitor closely with diuresis.   CRITICAL CARE Patient is critically ill with multiple organ systems affected and requires high complexity decision making. Total critical care time: 40 minutes. This time includes gathering of history, evaluation of patient's response to treatment, examination of patient, review of laboratory and imaging studies, and coordination with consultants. Actively managing  intropes at this time.  For questions or updates, please contact [place] Please consult [email or web address] for contact info under Cardiology/STEMI.     Signed, Buford Dresser, MD  07/17/2019, 10:31 AM

## 2019-07-17 NOTE — Progress Notes (Signed)
Physical Therapy Treatment Patient Details Name: Tony Saunders MRN: [id number] DOB: 27-Dec-1955 Today's Date: 07/17/2019    History of Present Illness Pt to ED 2* weakness and admitted with acute hypoxemic respiratory failure, acute decompensated, systolic CHF and severe pancytopenia 2* chemo and increasing L LE edema 2* ongoing Lymphoma.  Pt with hx of MI, seizure disorder, CHF, PAF - pt underwent Cardioversion 06/22/19    PT Comments    The patient remains quite weak, especially to stand and sit with decrease control. Patient reports plans to return Home. Patient has DME per patient.  Continue PT.  Follow Up Recommendations  Home health PT     Equipment Recommendations  None recommended by PT    Recommendations for Other Services OT consult     Precautions / Restrictions Precautions Precaution Comments: very frail, painful left foot    Mobility  Bed Mobility Overal bed mobility: Needs Assistance Bed Mobility: Supine to Sit Rolling: Min assist         General bed mobility comments: extra  time  assist with legs, slow to push up to sitting.  Transfers Overall transfer level: Needs assistance Equipment used: Rolling walker (2 wheeled) Transfers: Sit to/from [facility] Sit to Stand: From elevated surface;+2 physical assistance;+2 safety/equipment;Mod assist Stand pivot transfers: Mod assist;+2 physical assistance;+2 safety/equipment;From elevated surface       General transfer comment: assist to power up from bed, decreased control to descend  Ambulation/Gait Ambulation/Gait assistance: +2 physical assistance;+2 safety/equipment;Mod assist Gait Distance (Feet): 4 Feet Assistive device: Rolling walker (2 wheeled) Gait Pattern/deviations: Shuffle;Trunk flexed;Step-through pattern;Decreased stride length Gait velocity: decr   General Gait Details: Pt tolerated stand small steps x 4.   Stairs             Wheelchair Mobility     Modified Rankin (Stroke Patients Only)       Balance Overall balance assessment: Needs assistance;History of Falls Sitting-balance support: Feet supported;Bilateral upper extremity supported Sitting balance-Leahy Scale: Fair     Standing balance support: Bilateral upper extremity supported;During functional activity Standing balance-Leahy Scale: Poor                              Cognition   Behavior During Therapy: WFL for tasks assessed/performed                                          Exercises      General Comments        Pertinent Vitals/Pain Faces Pain Scale: Hurts little more Pain Location: left foot Pain Descriptors / Indicators: Sore Pain Intervention(s): Monitored during session;Limited activity within patient's tolerance    Home Living                      Prior Function            PT Goals (current goals can now be found in the care plan section) Progress towards PT goals: Progressing toward goals    Frequency    Min 3X/week      PT Plan Current plan remains appropriate    Co-evaluation              AM-PAC PT "6 Clicks" Mobility   Outcome Measure  Help needed turning from your back to your side while in a flat bed without  using bedrails?: A Lot Help needed moving from lying on your back to sitting on the side of a flat bed without using bedrails?: A Lot Help needed moving to and from a bed to a chair (including a wheelchair)?: A Lot Help needed standing up from a chair using your arms (e.g., wheelchair or bedside chair)?: A Lot Help needed to walk in hospital room?: A Lot Help needed climbing 3-5 steps with a railing? : Total 6 Click Score: 11    End of Session Equipment Utilized During Treatment: Gait belt;Oxygen Activity Tolerance: Patient limited by fatigue Patient left: in chair;with call bell/phone within reach;with nursing/sitter in room;with family/visitor present Nurse  Communication: Mobility status PT Visit Diagnosis: Difficulty in walking, not elsewhere classified (R26.2);Muscle weakness (generalized) (M62.81)     Time: 5859-2924 PT Time Calculation (min) (ACUTE ONLY): 29 min  Charges:  $Therapeutic Activity: 23-37 mins                     Tresa Endo PT Acute Rehabilitation Services Pager [phone] Office [phone]    Claretha Cooper 07/17/2019, 4:48 PM

## 2019-07-17 NOTE — Progress Notes (Addendum)
NAME:  Tony Saunders, MRN:  [id number], DOB:  December 06, 1955, LOS: 9 ADMISSION DATE:  07/08/2019, CONSULTATION DATE:  6/20 REFERRING MD:  Ashok Cordia, CHIEF COMPLAINT:  Weakness   Brief History   64 y/o male admitted on 6/20 with three weeks of diarrhea and weakness, noted on admission to have pancytopenia and fever.  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 On BiPAP 24/7/ Wants to trial off bipap Took off for 5 minutes and was able to converse without significant distress. Wife at bedside.  K severely low  Mag < 2gm%.  Off levophed. Off precredex gt. On lasix, digoxin, amio tab, plavix and xaretlo. On hydrocort since 6/20 (not on chronic steroids other than during chemo cycles ).  Per onc cheduled for next chemo in 1 week  6/27 - - RVP = positive Rhinovirus.   Still on milrionone gtt. On oral -> midodrine, lasix, digoxin, amio tab, plavix and xaretlo. On hydrocort since 6/20 (not on chronic steroids other than during chemo  cycles ) - reduced yeserday. Critically low phos repleted yesterday.  Respn: able to tolerate intermitten several hours off bipap yesterday. Got BiPAP QHS. This am-> now on HHFNC 20L and 30-50% fio2. Feeling better.  Consults:  Cardiology Palliative medicine   Procedures:  PICC 5/18 >   Significant Diagnostic Tests:  6/6 TTE > LVEF 35-40%, modeartely decreased function on left, global hypokinesis, RV systolic function normal, bilateral atrium dilated, severe MR, large pleural effusion  Micro Data:  6/20 SARS COV 2 > negative 6/20 blood >  6/21 urine culture 6/27 rhinovirus/enterovirus> positive  Antimicrobials:  6/20 cefepime >  6/20 flagyl > 6/26 (off 6/24) 6/20 vanc > 6/25  Interim history/subjective:   Feels better Wasn't up to bed much yesterday Phos very low again  Objective   Blood pressure (!) 114/59, pulse 78, temperature 98.1 F (36.7 C), temperature source Oral, resp. rate (!) 25, height 6' (1.829 m), weight 77.8 kg, SpO2 100 %. CVP:  [4 mmHg-9 mmHg] 9 mmHg      Intake/Output Summary (Last 24 hours) at 07/17/2019 0735 Last data filed at 07/17/2019 0600 Gross per 24 hour  Intake 506 ml  Output 3100 ml  Net -2594 ml   Filed Weights   07/15/19 0425 07/16/19 0500 07/17/19 0430  Weight: 81.2 kg 81 kg 77.8 kg    Examination:  General:  Frail, cachectic, resting comfortably in bed HENT: NCAT OP clear PULM: Wheezing bilaterally B, normal effort CV:  RRR, no mgr GI: BS+, soft, nontender MSK: normal bulk and tone Derm: some edema legs, thin skin, lots of bruises Neuro: awake, alert, no distress, MAEW   6/26 CXR images personally reviewed: diffuse bilateral airspace disease, R>L effusion  6/28 COOX (SvO2) 59%  6/27 SARS COV 2 IgG neg  Resolved Hospital Problem list     Assessment & Plan:  Acute rhinovirus pneumonia causing acute on chronic respiratory failure with hypoxemia> improved but still with chest congestion and still with persistent wheezing  6/28 Stressed importance of getting out of bed R pleural effusion Acute pulmonary edema from acute decompensated heart failure COPD baseline Continue pulmicort, xopenex and atrovent Maintain BIPAP off at this point Continue diuresis Out of bed to chair, incentive spirometry, flutter Repeat CXR, consider thoracentesis (most likely transudate, given bleeding risk would only perform if respiratory status worsened)  Acute decompensated systolic heart failure Severe Mitral regurgitation History of CAD, Hyperlipidemia Milrinone per cardiology> wean off this week Continue plavix, lasix Continue home midodrine Wean off hydrocortisone this week > 25 mg per day, off after tomorrow's dose  Severe hypokalemia > improved Severe hypophosphatemia  Replace today  Atrial fibrillation Tele Amiodarone, digoxin xarelto  Pancytopenia in setting of chemotherapy use for lymphoma> improving counts No evidence of brisk bleeding but fecal occult blood test positive on stool card on admission Monitor for bleeding Transfuse PRBC for Hgb < 7 gm/dL Outpatient GI work up   Diffuse large cell lymphoma Hold immunosuppressive cancer treatment given frailty  Pressure ulcer present on admission Wound care  Seizure disorder Contiue home lamictal, keppra  Severe protein calorie malnutrition Continue regular diet Dietary consult   Best practice:  Diet: dietary to see today Pain/Anxiety/Delirium protocol (if indicated): n/a VAP protocol (if indicated): n/a DVT prophylaxis: SCD GI prophylaxis: n/a Glucose control: monitor  Mobility: out of bed Code Status: short term intubation OK, no CPR if cardiac arrest Family Communication: updated wife by phone Disposition: remain in ICU  Labs   CBC: Recent Labs  Lab 07/13/19 0200 07/14/19 0500 07/15/19 0030 07/16/19 0629 07/17/19 0428  WBC 15.8* 7.3 8.8 7.4 7.0  NEUTROABS  --   --   --   --  6.2  HGB 10.1* 8.1* 7.6* 7.7* 7.8*  HCT 32.0* 25.7*  24.4* 24.7* 25.2*  MCV 88.4 88.6 89.4 90.1 91.3  PLT 84* 53* 55* 58* 66*    Basic Metabolic Panel: Recent Labs  Lab 07/12/19 1445 07/12/19 1445 07/13/19 0200 07/13/19 1352 07/14/19 0500 07/15/19 0030 07/16/19 0629 07/16/19 1738 07/17/19 0428  NA 139   < > 140   < > 140 138 139 141 137  K 2.1*   < > 3.3*   < > 2.8* 3.5 2.5* 2.8* 5.2*  CL 99   < > 101   < > 102 99 98 96* 97*  CO2 29   < > 25   < > 29 31 33* 32 29  GLUCOSE 185*   < > 162*   < > 116* 113* 82 146* 136*  BUN 24*   < > 25*   < > 24* 22 25* 28* 24*  CREATININE 1.03   < > 1.06   < > 0.93 0.90 0.92 0.94 0.81  CALCIUM 6.5*   < > 7.2*   < > 7.2* 7.0* 7.2* 7.3* 7.1*  MG 1.8  --  2.1  --  1.9 2.1 2.1  --   --   PHOS  --   --   --   --   --  1.0* 2.0*  --  1.3*   < > = values in this interval not displayed.   GFR: Estimated Creatinine Clearance: 103.8 mL/min (by C-G formula based on SCr of 0.81 mg/dL). Recent Labs  Lab 07/13/19 0200 07/13/19 1352 07/14/19 0500 07/15/19 0030 07/16/19 0629 07/17/19 0428  PROCALCITON  --  0.69 0.44  --   --   --   WBC   < >  --  7.3 8.8 7.4 7.0   < > = values in this interval not displayed.    Liver Function Tests: Recent Labs  Lab 07/16/19 0629 07/17/19 0428  AST 26 23  ALT 29 28  ALKPHOS 70 69  BILITOT 0.5 0.7  PROT 4.3* 4.3*  ALBUMIN 2.0* 2.0*   No results for input(s): LIPASE, AMYLASE in the last 168 hours. No results for input(s): AMMONIA in the last 168 hours.  ABG    Component Value Date/Time   HCO3 22.0 03/29/2019 1012   TCO2 23 03/29/2019 1012   ACIDBASEDEF 2.0 03/29/2019 1012   O2SAT 79.3 07/17/2019 0605     Coagulation Profile: Recent Labs  Lab 07/10/19 1258 07/16/19 0629  INR 1.2 1.3*    Cardiac Enzymes: No results for input(s): CKTOTAL, CKMB, CKMBINDEX, TROPONINI in the last 168 hours.  HbA1C: Hgb A1c MFr Bld  Date/Time Value Ref Range Status  03/27/2019 07:06 AM 5.3 4.8 - 5.6 % Final    Comment:    (NOTE) Pre diabetes:           5.7%-6.4% Diabetes:              >6.4% Glycemic control for   <7.0% adults with diabetes     CBG: No results for input(s): GLUCAP in the last 168 hours.   Critical care time: 35 minutes     Roselie Awkward, MD [facility] Pager: [phone] Cell: [phone] If no response, call [phone]

## 2019-07-17 NOTE — Progress Notes (Signed)
[facility] ADULT ICU REPLACEMENT PROTOCOL   The patient does apply for the [facility] Adult ICU Electrolyte Replacment Protocol based on the criteria listed below:   1. Is GFR >/= 30 ml/min? Yes.    Patient's GFR today is >60 2. Is SCr </= 2? Yes.   Patient's SCr is 0.81 ml/kg/hr 3. Did SCr increase >/= 0.5 in 24 hours? no 4. Abnormal electrolyte(s): phos 1.3 5. Ordered repletion with: protocol 6. If a panic level lab has been reported, has the CCM MD in charge been notified? No..   Physician:    Ronda Fairly A 07/17/2019 5:46 AM

## 2019-07-17 NOTE — Progress Notes (Signed)
Palliative Care Brief Progress Note  I briefly met with Tony Saunders today.  He reports having BM and requesting to be cleaned up.  NT aware.  Discussed that I am taking over PMT service for Dr. Rowe Pavy and he recalls meeting with her earlier this week.   We discussed his progress over the past couple of days and hope for continued improvement.  He is open to further follow-up but wants to focus on getting cleaned up at this time.  Will plan to follow-up further later this afternoon vs tomorrow and continue to progress conversation based upon his clinical course.  Micheline Rough, MD [facility] Team [phone]  NO CHARGE NOTE

## 2019-07-18 ENCOUNTER — Other Ambulatory Visit: Payer: No Typology Code available for payment source

## 2019-07-18 ENCOUNTER — Encounter: Payer: No Typology Code available for payment source | Admitting: Nutrition

## 2019-07-18 ENCOUNTER — Ambulatory Visit: Payer: No Typology Code available for payment source

## 2019-07-18 LAB — COMPREHENSIVE METABOLIC PANEL
ALT: 33 U/L (ref 0–44)
AST: 30 U/L (ref 15–41)
Albumin: 2.4 g/dL — ABNORMAL LOW (ref 3.5–5.0)
Alkaline Phosphatase: 80 U/L (ref 38–126)
Anion gap: 10 (ref 5–15)
BUN: 22 mg/dL (ref 8–23)
CO2: 34 mmol/L — ABNORMAL HIGH (ref 22–32)
Calcium: 7.6 mg/dL — ABNORMAL LOW (ref 8.9–10.3)
Chloride: 94 mmol/L — ABNORMAL LOW (ref 98–111)
Creatinine, Ser: 0.8 mg/dL (ref 0.61–1.24)
GFR calc Af Amer: >60 mL/min (ref >60)
GFR calc non Af Amer: >60 mL/min (ref >60)
Glucose, Bld: 116 mg/dL — ABNORMAL HIGH (ref 70–99)
Potassium: 3.3 mmol/L — ABNORMAL LOW (ref 3.5–5.1)
Sodium: 138 mmol/L (ref 135–145)
Total Bilirubin: 0.5 mg/dL (ref 0.3–1.2)
Total Protein: 4.8 g/dL — ABNORMAL LOW (ref 6.5–8.1)

## 2019-07-18 LAB — CBC
HCT: 27 % — ABNORMAL LOW (ref 39.0–52.0)
Hemoglobin: 8.5 g/dL — ABNORMAL LOW (ref 13.0–17.0)
MCH: 28.3 pg (ref 26.0–34.0)
MCHC: 31.5 g/dL (ref 30.0–36.0)
MCV: 90 fL (ref 80.0–100.0)
Platelets: 81 10*3/uL — ABNORMAL LOW (ref 150–400)
RBC: 3 MIL/uL — ABNORMAL LOW (ref 4.22–5.81)
RDW: 20.7 % — ABNORMAL HIGH (ref 11.5–15.5)
WBC: 7.4 10*3/uL (ref 4.0–10.5)
nRBC: 0 % (ref 0.0–0.2)

## 2019-07-18 LAB — COOXEMETRY PANEL
Carboxyhemoglobin: 1.9 % — ABNORMAL HIGH (ref 0.5–1.5)
Methemoglobin: 0.7 % (ref 0.0–1.5)
O2 Saturation: 69.5 %
Total hemoglobin: 7.9 g/dL — ABNORMAL LOW (ref 12.0–16.0)

## 2019-07-18 LAB — PHOSPHORUS: Phosphorus: 2.7 mg/dL (ref 2.5–4.6)

## 2019-07-18 NOTE — Evaluation (Signed)
Occupational Therapy Evaluation Patient Details Name: Tony Saunders MRN: [id number] DOB: 10/06/1955 Today's Date: 07/18/2019    History of Present Illness Pt to ED 2* weakness and admitted with acute hypoxemic respiratory failure, acute decompensated, systolic CHF and severe pancytopenia 2* chemo and increasing L LE edema 2* ongoing Lymphoma.  Pt with hx of MI, seizure disorder, CHF, PAF - pt underwent Cardioversion 06/22/19   Clinical Impression   Patient with functional deficits listed below impacting safety and independence with self care. Patient requires increased time for all mobility due to reports of dizziness with transition from supine to sit then sit to stand. Pt BP in chair 113/53, O2 stable on 1L. Patient require mod A with bed height elevated for sit to stand from EOB and mod A transfer to recliner with decreased eccentric control. Per chart patient has assist from family for self care/transfers and patient goal is to go home, however depending on acute stay progress may need to consider follow up rehab/SNF to maximize independence and safety.   Follow Up Recommendations  Home health OT;Supervision/Assistance - 24 hour;Other (comment) (vs SNF pending progress)    Equipment Recommendations  3 in 1 bedside commode       Precautions / Restrictions Precautions Precautions: Fall Precaution Comments: very frail, painful left foot Restrictions Weight Bearing Restrictions: No      Mobility Bed Mobility Overal bed mobility: Needs Assistance Bed Mobility: Supine to Sit     Supine to sit: Min assist;HOB elevated     General bed mobility comments: increased time, assist mobilizing L LE and use of bed rails to assist with trunk  Transfers Overall transfer level: Needs assistance Equipment used: Rolling walker (2 wheeled) Transfers: Sit to/from [facility] Sit to Stand: Mod assist;From elevated surface Stand pivot transfers: Mod assist;From elevated  surface       General transfer comment: assist to power up from bed, decreased control to descend    Balance Overall balance assessment: Needs assistance;History of Falls Sitting-balance support: Feet supported;Bilateral upper extremity supported Sitting balance-Leahy Scale: Fair     Standing balance support: Bilateral upper extremity supported;During functional activity Standing balance-Leahy Scale: Poor Standing balance comment: reliant on external support                           ADL either performed or assessed with clinical judgement   ADL Overall ADL's : Needs assistance/impaired Eating/Feeding: Set up;Sitting   Grooming: Set up;Sitting   Upper Body Bathing: Minimal assistance;Sitting;Bed level   Lower Body Bathing: Maximal assistance;Bed level   Upper Body Dressing : Minimal assistance;Moderate assistance;Sitting   Lower Body Dressing: Maximal assistance;Total assistance;Sitting/lateral leans;Sit to/from stand   Toilet Transfer: Moderate assistance;BSC;RW;Stand-pivot;Cueing for safety Toilet Transfer Details (indicate cue type and reason): bed height elevated with sit to stand to recliner, decreased eccentric control  Toileting- Clothing Manipulation and Hygiene: Maximal assistance;Total assistance;Bed level;Sitting/lateral lean       Functional mobility during ADLs: Moderate assistance;Rolling walker;Cueing for safety;Cueing for sequencing General ADL Comments: patient with increased pain, decreased strength, activity tolerance, balance, safety awareness requiring assist with self care and functional transfers                  Pertinent Vitals/Pain Pain Assessment: Faces Faces Pain Scale: Hurts little more Pain Location: left foot Pain Descriptors / Indicators: Sore Pain Intervention(s): Monitored during session     Hand Dominance Right   Extremity/Trunk Assessment Upper Extremity Assessment Upper Extremity  Assessment: Generalized  weakness   Lower Extremity Assessment Lower Extremity Assessment: Defer to PT evaluation       Communication Communication Communication: No difficulties   Cognition Arousal/Alertness: Awake/alert Behavior During Therapy: WFL for tasks assessed/performed Overall Cognitive Status: Within Functional Limits for tasks assessed                                     General Comments  VSS on 1L O2            Home Living Family/patient expects to be discharged to:: Private residence Living Arrangements: Spouse/significant other Available Help at Discharge: Family;Available 24 hours/day Type of Home: House Home Access: Stairs to enter [employer] of Steps: 1   Home Layout: Two level;1/2 bath on main level Alternate Level Stairs-Number of Steps: flight Alternate Level Stairs-Rails: Right     Bathroom Toilet: Standard     Home Equipment: Walker - standard;Wheelchair - manual          Prior Functioning/Environment Level of Independence: Needs assistance  Gait / Transfers Assistance Needed: Pt used walker with 4 "skids" and assist of spouse, reports he could get up stairs with assistance of children. ADL's / Homemaking Assistance Needed: Sponge bathes on main level with assist of spouse   Comments: Pt uses walker in home and w/c for distance.  Single step into home with walker vs wc dependent on pt ability on given day        OT Problem List: Decreased strength;Decreased activity tolerance;Decreased cognition;Decreased knowledge of use of DME or AE;Pain;Increased edema      OT Treatment/Interventions: Self-care/ADL training;Therapeutic exercise;DME and/or AE instruction;Therapeutic activities;Patient/family education    OT Goals(Current goals can be found in the care plan section) Acute Rehab OT Goals Patient Stated Goal: to go home OT Goal Formulation: With patient Time For Goal Achievement: 08/01/19 Potential to Achieve Goals: Fair  OT  Frequency: Min 2X/week    AM-PAC OT "6 Clicks" Daily Activity     Outcome Measure Help from another person eating meals?: A Little Help from another person taking care of personal grooming?: A Little Help from another person toileting, which includes using toliet, bedpan, or urinal?: A Lot Help from another person bathing (including washing, rinsing, drying)?: A Lot Help from another person to put on and taking off regular upper body clothing?: A Lot Help from another person to put on and taking off regular lower body clothing?: Total 6 Click Score: 13   End of Session Equipment Utilized During Treatment: Rolling walker;Oxygen Nurse Communication: Mobility status  Activity Tolerance: Patient tolerated treatment well Patient left: in chair;with call bell/phone within reach  OT Visit Diagnosis: Muscle weakness (generalized) (M62.81);Pain Pain - Right/Left: Left Pain - part of body: Ankle and joints of foot                Time: 1111-1141 OT Time Calculation (min): 30 min Charges:  OT General Charges $OT Visit: 1 Visit OT Evaluation $OT Eval Moderate Complexity: 1 Mod OT Treatments $Self Care/Home Management : 8-22 mins  Delbert Phenix OT Pager: [phone]  Rosemary Holms 07/18/2019, 2:23 PM

## 2019-07-18 NOTE — Progress Notes (Signed)
Progress Note  Patient Name: Tony Saunders Date of Encounter: 07/18/2019  Primary Cardiologist: Glenetta Hew, MD   Subjective   Sleeping better this AM. Breathing remains coarse but slightly improved every day. No chest pain or palpitations.  Inpatient Medications    Scheduled Meds: . amiodarone  200 mg Oral Daily  . budesonide (PULMICORT) nebulizer solution  0.5 mg Nebulization BID  . chlorhexidine  15 mL Mouth Rinse BID  . Chlorhexidine Gluconate Cloth  6 each Topical Daily  . clopidogrel  75 mg Oral Daily  . digoxin  0.125 mg Oral Daily  . feeding supplement  1 Container Oral BID BM  . feeding supplement (ENSURE ENLIVE)  237 mL Oral BID  . feeding supplement (PRO-STAT SUGAR FREE 64)  30 mL Oral TID BM  . furosemide  40 mg Intravenous Q12H  . ipratropium  0.5 mg Inhalation Q4H  . lamoTRIgine  100 mg Oral BID  . levalbuterol  1.25 mg Nebulization Q4H  . levETIRAcetam  750 mg Oral BID  . mouth rinse  15 mL Mouth Rinse q12n4p  . midodrine  5 mg Oral TID WC  . mirtazapine  15 mg Oral QHS  . multivitamin with minerals  1 tablet Oral Daily  . pantoprazole (PROTONIX) IV  40 mg Intravenous Q24H  . rivaroxaban  20 mg Oral Q breakfast  . rosuvastatin  5 mg Oral QHS  . sertraline  25 mg Oral Daily  . sodium chloride flush  10-40 mL Intracatheter Q12H   Continuous Infusions: . sodium chloride 10 mL/hr at 07/18/19 0500  . milrinone 0.125 mcg/kg/min (07/18/19 0500)   PRN Meds: sodium chloride, docusate sodium, lip balm, loperamide, polyethylene glycol, sodium chloride flush   Vital Signs    Vitals:   07/18/19 0800 07/18/19 0821 07/18/19 0823 07/18/19 0900  BP: (!) 116/59   (!) 110/57  Pulse: 83   80  Resp: (!) 21   (!) 27  Temp: (!) 97.5 F (36.4 C)     TempSrc: Oral     SpO2: 95% 96% 96% 97%  Weight:      Height:        Intake/Output Summary (Last 24 hours) at 07/18/2019 1133 Last data filed at 07/18/2019 0500 Gross per 24 hour  Intake 718.19 ml  Output  2400 ml  Net -1681.81 ml   Filed Weights   07/16/19 0500 07/17/19 0430 07/18/19 0400  Weight: 81 kg 77.8 kg 78 kg    Telemetry    Atrial fibrillation. PVC triplet seen this AM - Personally Reviewed  ECG    No new tracings - Personally Reviewed  Physical Exam   GEN: frail appearing gentleman, cachectic NECK: No JVD at 60 degrees CARDIAC: irregularly irregular rhythm, normal S1 and S2, no rubs or gallops. 2/6 systolic murmur. RESPIRATORY:  Coarse throughout, diminished at bases ABDOMEN: Soft, non-tender, non-distended SKIN: bilateral LE edema, worst at ankles, 2+ edema NEUROLOGIC:  Alert and oriented x 3. No focal neuro deficits noted. PSYCHIATRIC:  Normal affect   Labs    Chemistry Recent Labs  Lab 07/16/19 0629 07/16/19 1738 07/17/19 0428  NA 139 141 137  K 2.5* 2.8* 5.2*  CL 98 96* 97*  CO2 33* 32 29  GLUCOSE 82 146* 136*  BUN 25* 28* 24*  CREATININE 0.92 0.94 0.81  CALCIUM 7.2* 7.3* 7.1*  PROT 4.3*  --  4.3*  ALBUMIN 2.0*  --  2.0*  AST 26  --  23  ALT 29  --  28  ALKPHOS 70  --  74  BILITOT 0.5  --  0.7  GFRNONAA >60 >60 >60  GFRAA >60 >60 >60  ANIONGAP 8 13 11      Hematology Recent Labs  Lab 07/15/19 0030 07/16/19 0629 07/17/19 0428  WBC 8.8 7.4 7.0  RBC 2.73* 2.74* 2.76*  HGB 7.6* 7.7* 7.8*  HCT 24.4* 24.7* 25.2*  MCV 89.4 90.1 91.3  MCH 27.8 28.1 28.3  MCHC 31.1 31.2 31.0  RDW 18.7* 19.4* 20.1*  PLT 55* 58* 66*    Cardiac EnzymesNo results for input(s): TROPONINI in the last 168 hours. No results for input(s): TROPIPOC in the last 168 hours.   BNP Recent Labs  Lab 07/13/19 0200 07/14/19 0500 07/16/19 0629  BNP 608.0* 608.8* 675.9*     DDimer No results for input(s): DDIMER in the last 168 hours.   Radiology    DG CHEST PORT 1 VIEW  Result Date: 07/17/2019 CLINICAL DATA:  Acute on chronic congestive heart failure. Respiratory failure. EXAM: PORTABLE CHEST 1 VIEW COMPARISON:  Single-view of the chest 07/14/2019 and  07/13/2019. FINDINGS: Right PICC remains in place. Extensive bilateral airspace disease persist which shows mild improvement. There are small to moderate pleural effusions, greater on the right which also appears somewhat improved. No pneumothorax. Heart size is normal. IMPRESSION: Right greater than left pleural effusions and airspace disease persist but appear mildly improved. Electronically Signed   By: Inge Rise M.D.   On: 07/17/2019 11:00    Cardiac Studies   Echo 06/24/19 1. Left ventricular ejection fraction, by estimation, is 35 to 40%. The  left ventricle has moderately decreased function. The left ventricle  demonstrates global hypokinesis. The left ventricular internal cavity size  was mildly dilated. Left ventricular  diastolic parameters are indeterminate.  2. Right ventricular systolic function is normal. The right ventricular  size is normal. Tricuspid regurgitation signal is inadequate for assessing  PA pressure.  3. Left atrial size was mildly dilated.  4. Right atrial size was mildly dilated.  5. The mitral valve is normal in structure. Severe mitral valve  regurgitation from teathering of posterior MV leaflet. No evidence of  mitral stenosis.  6. The aortic valve is tricuspid. Aortic valve regurgitation is not  visualized. Mild to moderate aortic valve sclerosis/calcification is  present, without any evidence of aortic stenosis.  7. The inferior vena cava is normal in size with greater than 50%  respiratory variability, suggesting right atrial pressure of 3 mmHg.  8. Large pleural effusion in the left lateral region. No pericardial  effusion.   Patient Profile     64 y.o. 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   Assessment & Plan    Acute on chronic combined CHF with severe mitral regurgitation: Echo this admission with EF 35-40% with severe MR, previously EF 30-35% with moderate-severe MR.  -He has required inotropic support and BiPAP this admission.  -Admission weight 77.1 kg, peak weight 85.4 kg 6/22, weight today 78 kg. Net negative 10 L this admission, which does not match weight trends. -suspect some edema likely will be persistent due to poor nutritional status and hypoalbuminemia (last albumin 2.0) -Coox today 69.5 -Continue IV lasix 40mg  BID. Likely change to  daily tomorrow. As renal function has continued to improve, will continue diuresis as this has also improved his respiratory status. -Decreased milrinone 6/29 to 0.125 mcg/kg/min. Will plan to continue this today and possibly stop tomorrow. Did have PVC triplet this AM. If ectopy increases, ok to stop milrinone today -Continue digoxin -Continue to monitor strict I&Os and daily weights -Continue to monitor electrolytes closely to maintain K >4, Mg >2. Potassium has been highly variable.  Persistent atrial fibrillation: rates are stable this admission - Continue amiodarone for rhythm control for now. He has been in persistent fib; despite cardioversion 6/4 and 6/9, has reverted back to atrial fibrillation each time. Has been on amiodarone since May. With variable electrolytes, unlikely he is a candidate for dofetilide. With severe MR, left atrial enlargement, it may be difficult to keep him in sinus rhythm. If ultimately planned for rate control only, would consider stopping amiodarone. - Continue digoxin for rate control - Continue xarelto for stroke ppx  NO CHANGE TODAY:  Multivessel CAD s/p PCI to all 3 major vessels: last intervention 03/2019. Thankfully no chest pain complaints. Not on aspirin given need for anticoagulation for #2. -  Continue plavix and statin  Anemia/thrombocytopenia: 2/2 chemotherapy. No active bleeding noted. - Continue to monitor closely.  Diffuse large B cell lymphoma: diffuse, extensive disease. On chemotherapy, though delayed this week given ongoing acute illness. Palliative care is assisting with [place] discussions.  - Continue management per primary team, oncology, and palliative care  For questions or updates, please contact [place] Please consult [email or web address] for contact info under Cardiology/STEMI.     Signed, Buford Dresser, MD  07/18/2019, 11:33 AM

## 2019-07-18 NOTE — Progress Notes (Signed)
NAME:  Tony Saunders, MRN:  [id number], DOB:  1955/08/20, LOS: 32 ADMISSION DATE:  07/08/2019, CONSULTATION DATE:  6/20 REFERRING MD:  Ashok Cordia, CHIEF COMPLAINT:  Weakness   Brief History   64 y/o male admitted on 6/20 with three weeks of diarrhea and weakness, noted on admission to have pancytopenia and fever.  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 On BiPAP 24/7/ Wants to trial off bipap Took off for 5 minutes and was able to converse without significant distress. Wife at bedside.  K severely low  Mag < 2gm%.  Off levophed. Off precredex gt. On lasix, digoxin, amio tab, plavix and xaretlo. On hydrocort since 6/20 (not on chronic steroids other than during chemo cycles ).  Per onc cheduled for next chemo in 1 week  6/27 - - RVP = positive Rhinovirus.   Still on milrionone gtt. On oral -> midodrine, lasix, digoxin, amio tab, plavix and xaretlo. On hydrocort since 6/20 (not on chronic steroids other than during  chemo cycles ) - reduced yeserday. Critically low phos repleted yesterday.  Respn: able to tolerate intermitten several hours off bipap yesterday. Got BiPAP QHS. This am-> now on HHFNC 20L and 30-50% fio2. Feeling better.  6/30 several days of diuresis, feeling better, oxygenation improved  Consults:  Cardiology Palliative medicine   Procedures:  PICC 5/18 >   Significant Diagnostic Tests:  6/6 TTE > LVEF 35-40%, modeartely decreased function on left, global hypokinesis, RV systolic function normal, bilateral atrium dilated, severe MR, large pleural effusion  Micro Data:  6/20 SARS COV 2 > negative 6/20 blood >  6/21 urine culture 6/27 rhinovirus/enterovirus> positive  Antimicrobials:  6/20 cefepime >  6/20 flagyl > 6/26 (off 6/24) 6/20 vanc > 6/25  Interim history/subjective:   Feels better Didn't sleep much due to visits throughout the night  Objective   Blood pressure (!) 110/57, pulse 80, temperature (!) 97.5 F (36.4 C), temperature source Oral, resp. rate (!) 27, height 6' (1.829 m), weight 78 kg, SpO2 97 %. CVP:  [4 mmHg] 4 mmHg      Intake/Output Summary (Last 24 hours) at 07/18/2019 0957 Last data filed at 07/18/2019 0500 Gross per 24 hour  Intake 818.04 ml  Output 2400 ml  Net -1581.96 ml   Filed Weights   07/16/19 0500 07/17/19 0430 07/18/19 0400  Weight: 81 kg 77.8 kg 78 kg    Examination:  General:  Frail, cachectic, resting comfortably in bed HENT: NCAT  OP clear, temporal wasting PULM: Wheezing bilaterally B, normal effort CV: RRR, no mgr GI: BS+, soft, nontender MSK: normal bulk and tone Neuro: awake, alert, no distress, MAEW   6/30 COOX (SvO2) 69%  6/29 CXR > personally reviewed, trace effusion on R, bilateral airspace disease persists  Resolved Hospital Problem list     Assessment & Plan:  Acute rhinovirus pneumonia causing acute on chronic respiratory failure with hypoxemia> improved but still with chest congestion and still with  persistent wheezing 6/30 Stressed importance of getting out of bed R pleural effusion > significantly improved with diuresis Acute pulmonary edema from acute decompensated heart failure > improving COPD baseline ? > no PFT on record Continue pulmicort, xopenex, atrovent BIPAP off Continue diuresis Out of bed to chair, incentive spirometry, flutter Hold off on thoracentesis CXR prn  Acute decompensated systolic heart failure > improving with milrinone, diuresis Severe Mitral regurgitation History of CAD, Hyperlipidemia Stop hydrocortisone  Continue plavix, lasix (likely change lasix to daily or prn dosing over next 24 hours) Continue home midodrine Continue to wean off milrinone this week, monitoring coox  Loose stool Imodium prn  Severe hypokalemia > improved 6/29 Severe hypophosphatemia > improved Repeat BMET now Monitor UOP  Atrial fibrillation Tele Xarelto, digoxin, amiodarone  Pancytopenia in setting of chemotherapy use for lymphoma> improving counts No evidence of brisk bleeding but fecal occult blood test positive on stool card on admission Monitor for bleeding Transfuse PRBC for Hgb < 7 gm/dL Outpatient GI work up   Diffuse large cell lymphoma Hold immunosuppressive cancer treatment given failty  Pressure ulcer present on admission Wound care  Seizure disorder Continue home lamictal, keppra  Severe protein calorie malnutrition Continue regular diet + ensure/boost/prostat/MVI   Best practice:  Diet: dietary to see today Pain/Anxiety/Delirium protocol (if indicated): n/a VAP protocol (if indicated): n/a DVT prophylaxis: SCD GI prophylaxis: n/a Glucose control: monitor  Mobility: out of bed Code Status: short term intubation OK, no CPR if cardiac arrest Family Communication: updated wife by phone Disposition: remain in ICU  Labs   CBC: Recent Labs  Lab 07/13/19 0200 07/14/19 0500 07/15/19 0030 07/16/19 0629 07/17/19 0428  WBC 15.8* 7.3 8.8  7.4 7.0  NEUTROABS  --   --   --   --  6.2  HGB 10.1* 8.1* 7.6* 7.7* 7.8*  HCT 32.0* 25.7* 24.4* 24.7* 25.2*  MCV 88.4 88.6 89.4 90.1 91.3  PLT 84* 53* 55* 58* 66*    Basic Metabolic Panel: Recent Labs  Lab 07/12/19 1445 07/12/19 1445 07/13/19 0200 07/13/19 1352 07/14/19 0500 07/15/19 0030 07/16/19 0629 07/16/19 1738 07/17/19 0428 07/17/19 2045 07/18/19 0407  NA 139   < > 140   < > 140 138 139 141 137  --   --   K 2.1*   < > 3.3*   < > 2.8* 3.5 2.5* 2.8* 5.2*  --   --   CL 99   < > 101   < > 102 99 98 96* 97*  --   --   CO2 29   < > 25   < > 29 31 33* 32 29  --   --   GLUCOSE 185*   < > 162*   < > 116* 113* 82 146* 136*  --   --   BUN 24*   < > 25*   < > 24* 22 25* 28* 24*  --   --   CREATININE 1.03   < > 1.06   < >  0.93 0.90 0.92 0.94 0.81  --   --   CALCIUM 6.5*   < > 7.2*   < > 7.2* 7.0* 7.2* 7.3* 7.1*  --   --   MG 1.8  --  2.1  --  1.9 2.1 2.1  --   --   --   --   PHOS  --   --   --   --   --  1.0* 2.0*  --  1.3* 2.9 2.7   < > = values in this interval not displayed.   GFR: Estimated Creatinine Clearance: 103.8 mL/min (by C-G formula based on SCr of 0.81 mg/dL). Recent Labs  Lab 07/13/19 0200 07/13/19 1352 07/14/19 0500 07/15/19 0030 07/16/19 0629 07/17/19 0428  PROCALCITON  --  0.69 0.44  --   --   --   WBC   < >  --  7.3 8.8 7.4 7.0   < > = values in this interval not displayed.    Liver Function Tests: Recent Labs  Lab 07/16/19 0629 07/17/19 0428  AST 26 23  ALT 29 28  ALKPHOS 70 69  BILITOT 0.5 0.7  PROT 4.3* 4.3*  ALBUMIN 2.0* 2.0*   No results for input(s): LIPASE, AMYLASE in the last 168 hours. No results for input(s): AMMONIA in the last 168 hours.  ABG    Component Value Date/Time   HCO3 22.0 03/29/2019 1012   TCO2 23 03/29/2019 1012   ACIDBASEDEF 2.0 03/29/2019 1012   O2SAT 69.5 07/18/2019 0407     Coagulation Profile: Recent Labs  Lab 07/16/19 0629  INR 1.3*    Cardiac Enzymes: No results for input(s): CKTOTAL, CKMB,  CKMBINDEX, TROPONINI in the last 168 hours.  HbA1C: Hgb A1c MFr Bld  Date/Time Value Ref Range Status  03/27/2019 07:06 AM 5.3 4.8 - 5.6 % Final    Comment:    (NOTE) Pre diabetes:          5.7%-6.4% Diabetes:              >6.4% Glycemic control for   <7.0% adults with diabetes     CBG: No results for input(s): GLUCAP in the last 168 hours.   Critical care time: 31 minutes     Roselie Awkward, MD [facility] Pager: [phone] Cell: [phone] If no response, call [phone]

## 2019-07-19 ENCOUNTER — Telehealth: Payer: Self-pay | Admitting: Oncology

## 2019-07-19 DIAGNOSIS — L89001 Pressure ulcer of unspecified elbow, stage 1: Secondary | ICD-10-CM

## 2019-07-19 LAB — CBC
HCT: 27.7 % — ABNORMAL LOW (ref 39.0–52.0)
Hemoglobin: 8.5 g/dL — ABNORMAL LOW (ref 13.0–17.0)
MCH: 27.9 pg (ref 26.0–34.0)
MCHC: 30.7 g/dL (ref 30.0–36.0)
MCV: 90.8 fL (ref 80.0–100.0)
Platelets: 70 10*3/uL — ABNORMAL LOW (ref 150–400)
RBC: 3.05 MIL/uL — ABNORMAL LOW (ref 4.22–5.81)
RDW: 20.9 % — ABNORMAL HIGH (ref 11.5–15.5)
WBC: 7.4 10*3/uL (ref 4.0–10.5)
nRBC: 0 % (ref 0.0–0.2)

## 2019-07-19 LAB — COOXEMETRY PANEL
Carboxyhemoglobin: 2.8 % — ABNORMAL HIGH (ref 0.5–1.5)
Methemoglobin: 0.9 % (ref 0.0–1.5)
O2 Saturation: 57.8 %
Total hemoglobin: 9.3 g/dL — ABNORMAL LOW (ref 12.0–16.0)

## 2019-07-19 LAB — BASIC METABOLIC PANEL
Anion gap: 11 (ref 5–15)
BUN: 17 mg/dL (ref 8–23)
CO2: 35 mmol/L — ABNORMAL HIGH (ref 22–32)
Calcium: 7.3 mg/dL — ABNORMAL LOW (ref 8.9–10.3)
Chloride: 94 mmol/L — ABNORMAL LOW (ref 98–111)
Creatinine, Ser: 0.74 mg/dL (ref 0.61–1.24)
GFR calc Af Amer: >60 mL/min (ref >60)
GFR calc non Af Amer: >60 mL/min (ref >60)
Glucose, Bld: 83 mg/dL (ref 70–99)
Potassium: 2.8 mmol/L — ABNORMAL LOW (ref 3.5–5.1)
Sodium: 140 mmol/L (ref 135–145)

## 2019-07-19 MED ORDER — POTASSIUM CHLORIDE CRYS ER 20 MEQ PO TBCR
40.0000 meq | EXTENDED_RELEASE_TABLET | Freq: Once | ORAL | Status: AC
  Administered 2019-07-19: 40 meq via ORAL
  Filled 2019-07-19: qty 2

## 2019-07-19 MED ORDER — POTASSIUM CHLORIDE 10 MEQ/50ML IV SOLN
10.0000 meq | INTRAVENOUS | Status: AC
  Administered 2019-07-19 (×4): 10 meq via INTRAVENOUS
  Filled 2019-07-19 (×2): qty 50

## 2019-07-19 NOTE — Progress Notes (Signed)
Physical Therapy Treatment Patient Details Name: Tony Saunders MRN: [id number] DOB: 11/06/1955 Today's Date: 07/19/2019    History of Present Illness Pt to ED 2* weakness and admitted with acute hypoxemic respiratory failure, acute decompensated, systolic CHF and severe pancytopenia 2* chemo and increasing L LE edema 2* ongoing Lymphoma.  Pt with hx of MI, seizure disorder, CHF, PAF - pt underwent Cardioversion 06/22/19    PT Comments    The patient eager to  Perform strengthening exercises for U/LE's. Patient assisted to sitting bedside. Stood x 1 with 2 mod assisting, remains weak to power up and decreased control of descent. Took side steps along bed using RW. Patient on 2 L [place], SpO2 100%, HR 107.  Patient declined to sit in recliner due to painful sacrum. Placed in bed chair  Position and was comfortable. Continue  progressive mobility.    Follow Up Recommendations  Home health PT with  Max support vs  SNF     Equipment Recommendations  None recommended by PT    Recommendations for Other Services       Precautions / Restrictions Precautions Precautions: Fall Precaution Comments: very frail, painful left foot. knees subject to buckle.    Mobility  Bed Mobility   Bed Mobility: Supine to Sit     Supine to sit: Min assist;HOB elevated     General bed mobility comments: increased time, assist mobilizing L LE and use of bed rails to assist with trunk  Transfers Overall transfer level: Needs assistance Equipment used: Rolling walker (2 wheeled) Transfers: Sit to/from Stand Sit to Stand: Mod assist;From elevated surface;+2 physical assistance Stand pivot transfers: Mod assist;From elevated surface       General transfer comment: assist to power up from bed, decreased control to descend, 4 side steps along bedside, noted knees  wobbling, not static  Ambulation/Gait                 Stairs             Wheelchair Mobility    Modified Rankin (Stroke  Patients Only)       Balance Overall balance assessment: Needs assistance;History of Falls Sitting-balance support: Feet supported;Bilateral upper extremity supported Sitting balance-Leahy Scale: Fair     Standing balance support: Bilateral upper extremity supported;During functional activity Standing balance-Leahy Scale: Poor Standing balance comment: reliant on external support                            Cognition Arousal/Alertness: Awake/alert Behavior During Therapy: WFL for tasks assessed/performed                                          Exercises General Exercises - Lower Extremity Ankle Circles/Pumps: AROM;10 reps;Both;Seated Long Arc Quad: AROM;Both;Seated;10 reps Hip Flexion/Marching: Both;Seated;AAROM;10 reps Toe Raises: AROM;10 reps;Seated Heel Raises: AROM;Both;10 reps;Seated Other Exercises Other Exercises: shoulder exercises with orange TB x 10-extension and flexion shoulders, sh. abductuion, elbow extensiona nd flexion x- all x 10 reps    General Comments        Pertinent Vitals/Pain Faces Pain Scale: Hurts little more Pain Location: left foot Pain Descriptors / Indicators: Sore;Discomfort Pain Intervention(s): Monitored during session;Limited activity within patient's tolerance;Repositioned    Home Living  Prior Function            PT Goals (current goals can now be found in the care plan section) Acute Rehab PT Goals Time For Goal Achievement: 07/29/19    Frequency    Min 3X/week      PT Plan Current plan remains appropriate    Co-evaluation              AM-PAC PT "6 Clicks" Mobility   Outcome Measure  Help needed turning from your back to your side while in a flat bed without using bedrails?: A Lot Help needed moving from lying on your back to sitting on the side of a flat bed without using bedrails?: A Lot Help needed moving to and from a bed to a chair (including a  wheelchair)?: A Lot Help needed standing up from a chair using your arms (e.g., wheelchair or bedside chair)?: A Lot Help needed to walk in hospital room?: Total Help needed climbing 3-5 steps with a railing? : Total 6 Click Score: 10    End of Session Equipment Utilized During Treatment: Gait belt;Oxygen Activity Tolerance: Patient limited by fatigue Patient left:  (left in bed chair position as recliner is too painful on sacrum.) Nurse Communication: Mobility status PT Visit Diagnosis: Difficulty in walking, not elsewhere classified (R26.2);Muscle weakness (generalized) (M62.81)     Time: 5726-2035 PT Time Calculation (min) (ACUTE ONLY): 24 min  Charges:  $Therapeutic Exercise: 8-22 mins $Therapeutic Activity: 8-22 mins                     Tresa Endo PT Acute Rehabilitation Services Pager [phone] Office [phone]    Claretha Cooper 07/19/2019, 3:08 PM

## 2019-07-19 NOTE — Telephone Encounter (Signed)
Called pt per 7/1 sch message - no answer. Pt to get an updated schedule with discharge

## 2019-07-19 NOTE — Progress Notes (Signed)
Events noted.  Laboratory data reviewed.  Clinical status seems to be improving slowly although he still quite debilitated and would not tolerate the restart of chemotherapy quite yet.  I feel that retreatment with chemotherapy anytime in the immediate future might cause a setback and further complications.  I will arrange a tentative restart of his chemotherapy for the week of July 30, 2019.  We will continue to monitor his progress in the interim.  Obviously this date will be changed and postponed further his clinical status does not improve adequately.

## 2019-07-19 NOTE — Progress Notes (Signed)
NAME:  Tony Saunders, MRN:  [id number], DOB:  [birth year], LOS: 40 ADMISSION DATE:  07/08/2019, CONSULTATION DATE:  6/20 REFERRING MD:  Ashok Cordia, CHIEF COMPLAINT:  Weakness   Brief History   64 y/o male admitted on 6/20 with three weeks of diarrhea and weakness, noted on admission to have pancytopenia and fever.  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 On BiPAP 24/7/ Wants to trial off bipap Took off for 5 minutes and was able to converse without significant distress. Wife at bedside.  K severely low  Mag < 2gm%.  Off levophed. Off precredex gt. On lasix, digoxin, amio tab, plavix and xaretlo. On hydrocort since 6/20 (not on chronic steroids other than during chemo cycles ).  Per onc cheduled for next chemo in 1 week  6/27 - - RVP = positive Rhinovirus.   Still on milrionone gtt. On oral -> midodrine, lasix, digoxin, amio tab, plavix and xaretlo. On hydrocort since 6/20 (not on chronic steroids other than during  chemo cycles ) - reduced yeserday. Critically low phos repleted yesterday.  Respn: able to tolerate intermitten several hours off bipap yesterday. Got BiPAP QHS. This am-> now on HHFNC 20L and 30-50% fio2. Feeling better.  6/30 several days of diuresis, feeling better, oxygenation improved  Consults:  Cardiology Palliative medicine   Procedures:  PICC 5/18 >   Significant Diagnostic Tests:  6/6 TTE > LVEF 35-40%, modeartely decreased function on left, global hypokinesis, RV systolic function normal, bilateral atrium dilated, severe MR, large pleural effusion  Micro Data:  6/20 SARS COV 2 > negative 6/20 blood >  6/21 urine culture 6/27 rhinovirus/enterovirus> positive  Antimicrobials:  6/20 cefepime >  6/20 flagyl > 6/26 (off 6/24) 6/20 vanc > 6/25  Interim history/subjective:   Feels better Not sleeping well in the hospital secondary to being woken up many times  Objective   Blood pressure 107/60, pulse 100, temperature 98 F (36.7 C), temperature source Oral, resp. rate (!) 24, height 6' (1.829 m), weight 78 kg, SpO2 95 %. CVP:  [4 mmHg-8 mmHg] 6 mmHg      Intake/Output Summary (Last 24 hours) at 07/19/2019 1147 Last data filed at 07/19/2019 0623 Gross per 24 hour  Intake 1157.09 ml  Output 3000 ml  Net -1842.91 ml   Filed Weights   07/16/19 0500 07/17/19 0430 07/18/19 0400  Weight: 81 kg 77.8 kg 78 kg    Examination:  General: Frail, resting comfortably in bed f  HENT: Moist oral mucosa, clear PULM: No added sounds, decreased at bases CV: S1-S2 appreciated GI: BS+, soft, nontender MSK: Decreased muscle bulk Neuro: awake, alert, no distress, MAEW  6/30 COOX (SvO2) 69%, 7/1-58%  6/29 CXR > reviewed by myself, trace effusion on R, bilateral airspace disease persists  Resolved Hospital Problem list     Assessment & Plan:  Acute on chronic respiratory failure Rhinovirus pneumonia Decompensated heart failure Possible obstructive lung disease at  baseline -Continue Pulmicort, Xopenex, Atrovent -Cautious diuresis -Out of bed to chair as tolerated  Acute decompensated systolic heart failure Severe mitral regurg History of coronary artery diseasenone -Continue Plavix -Continue Lasix -Continue midodrine -Weaning off milrinone  Hypokalemia, hypophosphatemia -Continue repletion   Atrial fibrillation Tele Xarelto, digoxin, amiodarone  Pancytopenia in setting of chemotherapy use for lymphoma> improving counts No evidence of brisk bleeding but fecal occult blood test positive on stool card on admission Monitor for bleeding Transfuse PRBC for Hgb < 7 gm/dL Outpatient GI work up   Diffuse large cell lymphoma Hold immunosuppressive cancer treatment given failty Oncology continues to follow -Plan is to possibly continue chemotherapy second week of July  Pressure ulcer present on admission Wound care  Seizure disorder Continue home lamictal, keppra  Severe protein calorie malnutrition Continue regular diet + ensure/boost/prostat/MVI   Best practice:  Diet:  Pain/Anxiety/Delirium protocol (if indicated): n/a VAP protocol (if indicated): n/a DVT prophylaxis: SCD GI prophylaxis: n/a Glucose control: monitor  Mobility: out of bed Code Status: short term intubation OK, no CPR if cardiac arrest Family Communication: updated wife by phone Disposition: remain in ICU  Labs   CBC: Recent Labs  Lab 07/15/19 0030 07/16/19 0629 07/17/19 0428 07/18/19 1002 07/19/19 0424  WBC 8.8 7.4 7.0 7.4 7.4  NEUTROABS  --   --  6.2  --   --   HGB 7.6* 7.7* 7.8* 8.5* 8.5*  HCT 24.4* 24.7* 25.2* 27.0* 27.7*  MCV 89.4 90.1 91.3 90.0 90.8  PLT 55* 58* 66* 81* 70*    Basic Metabolic Panel: Recent Labs  Lab 07/12/19 1445 07/12/19 1445 07/13/19 0200 07/13/19 1352 07/14/19 0500 07/14/19 0500 07/15/19 0030 07/15/19 0030 07/16/19 0629 07/16/19 1738 07/17/19 0428 07/17/19 2045 07/18/19 0407 07/18/19 1002 07/19/19 0424  NA  139   < > 140   < > 140   < > 138   < > 139 141 137  --   --  138 140  K 2.1*   < > 3.3*   < > 2.8*   < > 3.5   < > 2.5* 2.8* 5.2*  --   --  3.3* 2.8*  CL 99   < > 101   < > 102   < > 99   < > 98 96* 97*  --   --  94* 94*  CO2 29   < > 25   < > 29   < > 31   < > 33* 32 29  --   --  34* 35*  GLUCOSE 185*   < > 162*   < > 116*   < > 113*   < > 82 146* 136*  --   --  116* 83  BUN 24*   < > 25*   < > 24*   < > 22   < > 25* 28* 24*  --   --  22 17  CREATININE 1.03   < > 1.06   < > 0.93   < > 0.90   < >  0.92 0.94 0.81  --   --  0.80 0.74  CALCIUM 6.5*   < > 7.2*   < > 7.2*   < > 7.0*   < > 7.2* 7.3* 7.1*  --   --  7.6* 7.3*  MG 1.8  --  2.1  --  1.9  --  2.1  --  2.1  --   --   --   --   --   --   PHOS  --   --   --   --   --   --  1.0*  --  2.0*  --  1.3* 2.9 2.7  --   --    < > = values in this interval not displayed.   GFR: Estimated Creatinine Clearance: 105.1 mL/min (by C-G formula based on SCr of 0.74 mg/dL). Recent Labs  Lab 07/13/19 0200 07/13/19 1352 07/14/19 0500 07/15/19 0030 07/16/19 0629 07/17/19 0428 07/18/19 1002 07/19/19 0424  PROCALCITON  --  0.69 0.44  --   --   --   --   --   WBC   < >  --  7.3   < > 7.4 7.0 7.4 7.4   < > = values in this interval not displayed.    Liver Function Tests: Recent Labs  Lab 07/16/19 0629 07/17/19 0428 07/18/19 1002  AST 26 23 30   ALT 29 28 33  ALKPHOS 70 69 80  BILITOT 0.5 0.7 0.5  PROT 4.3* 4.3* 4.8*  ALBUMIN 2.0* 2.0* 2.4*   No results for input(s): LIPASE, AMYLASE in the last 168 hours. No results for input(s): AMMONIA in the last 168 hours.  ABG    Component Value Date/Time   HCO3 22.0 03/29/2019 1012   TCO2 23 03/29/2019 1012   ACIDBASEDEF 2.0 03/29/2019 1012   O2SAT 57.8 07/19/2019 0424     Coagulation Profile: Recent Labs  Lab 07/16/19 0629  INR 1.3*    Cardiac Enzymes: No results for input(s): CKTOTAL, CKMB, CKMBINDEX, TROPONINI in the last 168 hours.  HbA1C: Hgb A1c MFr Bld  Date/Time Value Ref Range  Status  03/27/2019 07:06 AM 5.3 4.8 - 5.6 % Final    Comment:    (NOTE) Pre diabetes:          5.7%-6.4% Diabetes:              >6.4% Glycemic control for   <7.0% adults with diabetes     CBG: No results for input(s): GLUCAP in the last 168 hours.  The patient is critically ill with multiple organ systems failure and requires high complexity decision making for assessment and support, frequent evaluation and titration of therapies, application of advanced monitoring technologies and extensive interpretation of multiple databases. Critical Care Time devoted to patient care services described in this note independent of APP/resident time (if applicable)  is 32 minutes.   Sherrilyn Rist MD [facility] Personal pager: [phone] If unanswered, please page CCM On-call: [phone]

## 2019-07-19 NOTE — Progress Notes (Signed)
Progress Note  Patient Name: Tony Saunders Date of Encounter: 07/19/2019  Primary Cardiologist: Glenetta Hew, MD  Subjective   Sleeping when I arrived, easily awoke. Reports he's feeling better overall today without CP or SOB.  Inpatient Medications    Scheduled Meds: . amiodarone  200 mg Oral Daily  . budesonide (PULMICORT) nebulizer solution  0.5 mg Nebulization BID  . chlorhexidine  15 mL Mouth Rinse BID  . Chlorhexidine Gluconate Cloth  6 each Topical Daily  . clopidogrel  75 mg Oral Daily  . digoxin  0.125 mg Oral Daily  . feeding supplement  1 Container Oral BID BM  . feeding supplement (ENSURE ENLIVE)  237 mL Oral BID  . feeding supplement (PRO-STAT SUGAR FREE 64)  30 mL Oral TID BM  . furosemide  40 mg Intravenous Q12H  . ipratropium  0.5 mg Inhalation Q4H  . lamoTRIgine  100 mg Oral BID  . levalbuterol  1.25 mg Nebulization Q4H  . levETIRAcetam  750 mg Oral BID  . mouth rinse  15 mL Mouth Rinse q12n4p  . midodrine  5 mg Oral TID WC  . mirtazapine  15 mg Oral QHS  . multivitamin with minerals  1 tablet Oral Daily  . pantoprazole (PROTONIX) IV  40 mg Intravenous Q24H  . rivaroxaban  20 mg Oral Q breakfast  . rosuvastatin  5 mg Oral QHS  . sertraline  25 mg Oral Daily  . sodium chloride flush  10-40 mL Intracatheter Q12H   Continuous Infusions: . sodium chloride 250 mL (07/19/19 0258)  . milrinone 0.125 mcg/kg/min (07/19/19 0257)  . potassium chloride 10 mEq (07/19/19 0703)   PRN Meds: sodium chloride, docusate sodium, lip balm, loperamide, polyethylene glycol, sodium chloride flush   Vital Signs    Vitals:   07/19/19 0300 07/19/19 0400 07/19/19 0600 07/19/19 0700  BP: 126/70 100/63 (!) 107/59 (!) 114/59  Pulse: 100 (!) 102 (!) 101 (!) 103  Resp: (!) 26 (!) 21 (!) 27 (!) 24  Temp:  (!) 97.5 F (36.4 C)    TempSrc:  Oral    SpO2: 92% 93% 98% 95%  Weight:      Height:        Intake/Output Summary (Last 24 hours) at 07/19/2019 0759 Last data  filed at 07/19/2019 7416 Gross per 24 hour  Intake 1221.51 ml  Output 3000 ml  Net -1778.49 ml   Last 3 Weights 07/18/2019 07/17/2019 07/16/2019  Weight (lbs) 171 lb 15.3 oz 171 lb 8.3 oz 178 lb 9.2 oz  Weight (kg) 78 kg 77.8 kg 81 kg     Telemetry    ? Sinus tach with long first degree AVB with occasional PVCs - Personally Reviewed  Physical Exam   GEN: Cachectic and chronically ill appearing HEENT: Normocephalic, atraumatic, sclera non-icteric. Neck: No JVD or bruits. Cardiac: Regular rhythm, rate presently controlled, 2/6 soft blowing systolic murmur at apex. Radials/DP/PT 1+ and equal bilaterally.  Respiratory: Coarse throughout, no wheezes, rales or rhonchi, diminished at bases, unlabored - productive sounding cough noted GI: Soft, nontender, non-distended, BS +x 4. MS: no deformity. Extremities: No clubbing or cyanosis. 2+ soft puffy bilateral lower edema. Distal pedal pulses are 2+ and equal bilaterally. Neuro:  AAOx3. Follows commands. Psych:  Responds to questions appropriately with a normal affect.  Labs    High Sensitivity Troponin:   Recent Labs  Lab 07/08/19 1416 07/08/19 1558  TROPONINIHS 96* 96*      Cardiac EnzymesNo results for input(s): TROPONINI  in the last 168 hours. No results for input(s): TROPIPOC in the last 168 hours.   Chemistry Recent Labs  Lab 07/16/19 0629 07/16/19 1738 07/17/19 0428 07/18/19 1002 07/19/19 0424  NA 139   < > 137 138 140  K 2.5*   < > 5.2* 3.3* 2.8*  CL 98   < > 97* 94* 94*  CO2 33*   < > 29 34* 35*  GLUCOSE 82   < > 136* 116* 83  BUN 25*   < > 24* 22 17  CREATININE 0.92   < > 0.81 0.80 0.74  CALCIUM 7.2*   < > 7.1* 7.6* 7.3*  PROT 4.3*  --  4.3* 4.8*  --   ALBUMIN 2.0*  --  2.0* 2.4*  --   AST 26  --  23 30  --   ALT 29  --  28 33  --   ALKPHOS 70  --  69 80  --   BILITOT 0.5  --  0.7 0.5  --   GFRNONAA >60   < > >60 >60 >60  GFRAA >60   < > >60 >60 >60  ANIONGAP 8   < > 11 10 11    < > = values in this interval  not displayed.     Hematology Recent Labs  Lab 07/17/19 0428 07/18/19 1002 07/19/19 0424  WBC 7.0 7.4 7.4  RBC 2.76* 3.00* 3.05*  HGB 7.8* 8.5* 8.5*  HCT 25.2* 27.0* 27.7*  MCV 91.3 90.0 90.8  MCH 28.3 28.3 27.9  MCHC 31.0 31.5 30.7  RDW 20.1* 20.7* 20.9*  PLT 66* 81* 70*    BNP Recent Labs  Lab 07/13/19 0200 07/14/19 0500 07/16/19 0629  BNP 608.0* 608.8* 675.9*     DDimer No results for input(s): DDIMER in the last 168 hours.   Radiology    DG CHEST PORT 1 VIEW  Result Date: 07/17/2019 CLINICAL DATA:  Acute on chronic congestive heart failure. Respiratory failure. EXAM: PORTABLE CHEST 1 VIEW COMPARISON:  Single-view of the chest 07/14/2019 and 07/13/2019. FINDINGS: Right PICC remains in place. Extensive bilateral airspace disease persist which shows mild improvement. There are small to moderate pleural effusions, greater on the right which also appears somewhat improved. No pneumothorax. Heart size is normal. IMPRESSION: Right greater than left pleural effusions and airspace disease persist but appear mildly improved. Electronically Signed   By: Inge Rise M.D.   On: 07/17/2019 11:00    Cardiac Studies   06/24/19 Echo 1. Left ventricular ejection fraction, by estimation, is 35 to 40%. The  left ventricle has moderately decreased function. The left ventricle  demonstrates global hypokinesis. The left ventricular internal cavity size  was mildly dilated. Left ventricular  diastolic parameters are indeterminate.  2. Right ventricular systolic function is normal. The right ventricular  size is normal. Tricuspid regurgitation signal is inadequate for assessing  PA pressure.  3. Left atrial size was mildly dilated.  4. Right atrial size was mildly dilated.  5. The mitral valve is normal in structure. Severe mitral valve  regurgitation from teathering of posterior MV leaflet. No evidence of  mitral stenosis.  6. The aortic valve is tricuspid. Aortic valve  regurgitation is not  visualized. Mild to moderate aortic valve sclerosis/calcification is  present, without any evidence of aortic stenosis.  7. The inferior vena cava is normal in size with greater than 50%  respiratory variability, suggesting right atrial pressure of 3 mmHg.  8. Large pleural effusion in the  left lateral region. No pericardial  effusion.    Patient Profile     64 y.o. male with CAD (remote PCI to RCA 2006, inferior STEMI 03/2019 s/p recent PCI with urgent DESx2 to RCA followed by staged PCI to Cx and LAD, persistent atrial fib/flutter s/p multiple DCCV (last 07/24/78), chronic systolic and diastolic heart failure (LVEF 30-35%) with ischemic mitral regurgitation (severe), seizures, and recently diagnosed diffuse large cell lymphoma s/p 2 cycles of R-CHOP. He was recently admitted 5/28-6/12 with pancytopenia with recurrent atrial fib/flutter, a/c CHF, colonic ileus, oropharyngeal canddiasis, and hypotension requiring midodrine. He was readmitted 07/08/19 with weakness, fever, pancytopenia/neutropenia, heme positive stools, acute on chronic respiratory failure with hypoxemia, and acute on chronic heart failure. Found to have acute rhinovirus pneumonia and volume overload requiring diuresis. Also required initiation of milrinone. Other issues as outlined in notes with electrolyte abnormalities, pressure ulcer, severe protein calorie malnutrition with hypoalbuminemia.  Assessment & Plan    1. Acute on chronic respiratory failure with rhinovirus positive pneumonia, pancytopenia and diffuse large B cell lymphoma - concomitantly followed by PCCM, oncology and palliative to aid in [place] discussions. Per oncology, patient still too debilitated to restart chemotherapy quite yet so this has been postponed.  2. Acute on chronic combined CHF with severe mitral regurgitation - Echo this admission with EF 35-40% with severe MR, previously EF 30-35% with moderate-severe MR. He has required inotropic  support and BiPAP this admission. Diuresis has been limited by hypotension.  -12L thus far, peak weight 188lb, at 171.9 yesterday (not yet weighed today). Last CVP recorded at 7. Remains on milrinone with [place] 57.8 today. - Hypoalbuminemia likely contributing to ongoing edema  - Plan to review with Dr. Harrell Gave given patient complexity  3. Persistent atrial fib (with history of atrial flutter) - continued on amiodarone and digoxin for now. He has failed prior cardioversions with reversion to atrial fib. With variable electrolytes, unlikely he is a candidate for dofetilide.  - With severe MR, left atrial enlargement, and continued medical illnesses, it may be difficult to keep him in sinus rhythm.  If ultimately planned for rate control only, would consider stopping amiodarone.  - Telemetry does appear to show possible NSR/ST with first degree AVB - will obtain EKG to clarify - Continue Xarelto at present dose (CrCl wnl)  4. CAD - no complaints of angina this admission - previous DAPT changed to Plavix monotherapy in context of needing anticoagulation for the above (and recent PCI) - Continue low dose Crestor - Not on BB given need for inotropes.  5. Anemia/thrombocytopenia with heme-positive stools this admission - Hgb has been relatively stable the last several days. Outpatient GI workup has been suggested. Plt count at 70 today - lower than yesterday but higher than previous values.  - Continue to monitor on blood thinner therapy  6. Hypokalemia (with recent hyperkalemia) - in context of diuresis - 2.8 today. Received 76meq x1 this morning with 4 runs of IV ordered. Mg OK on 6/28.  7. Hypocalcemia - per primary team.  For questions or updates, please contact [place] Please consult [email or web address] for contact info under Cardiology/STEMI.  Signed, Charlie Pitter, PA-C 07/19/2019, 7:59 AM

## 2019-07-19 NOTE — Progress Notes (Signed)
[facility] ADULT ICU REPLACEMENT PROTOCOL   The patient does apply for the [facility] Adult ICU Electrolyte Replacment Protocol based on the criteria listed below:   1. Is GFR >/= 30 ml/min? Yes.    Patient's GFR today is >60 2. Is SCr </= 2? Yes.   Patient's SCr is 0.74 ml/kg/hr 3. Did SCr increase >/= 0.5 in 24 hours? 4. Abnormal electrolyte(s): K+ 2.8 5. Ordered repletion with: Protocol 6. If a panic level lab has been reported, has the CCM MD in charge been notified? Yes.  .   Physician:  Ignacia Marvel 07/19/2019 6:15 AM

## 2019-07-20 ENCOUNTER — Ambulatory Visit: Payer: No Typology Code available for payment source

## 2019-07-20 LAB — BASIC METABOLIC PANEL
Anion gap: 9 (ref 5–15)
BUN: 19 mg/dL (ref 8–23)
CO2: 36 mmol/L — ABNORMAL HIGH (ref 22–32)
Calcium: 7.9 mg/dL — ABNORMAL LOW (ref 8.9–10.3)
Chloride: 93 mmol/L — ABNORMAL LOW (ref 98–111)
Creatinine, Ser: 0.75 mg/dL (ref 0.61–1.24)
GFR calc Af Amer: >60 mL/min (ref >60)
GFR calc non Af Amer: >60 mL/min (ref >60)
Glucose, Bld: 82 mg/dL (ref 70–99)
Potassium: 3.5 mmol/L (ref 3.5–5.1)
Sodium: 138 mmol/L (ref 135–145)

## 2019-07-20 LAB — CBC
HCT: 26.9 % — ABNORMAL LOW (ref 39.0–52.0)
Hemoglobin: 8.3 g/dL — ABNORMAL LOW (ref 13.0–17.0)
MCH: 28.3 pg (ref 26.0–34.0)
MCHC: 30.9 g/dL (ref 30.0–36.0)
MCV: 91.8 fL (ref 80.0–100.0)
Platelets: 94 10*3/uL — ABNORMAL LOW (ref 150–400)
RBC: 2.93 MIL/uL — ABNORMAL LOW (ref 4.22–5.81)
RDW: 21.1 % — ABNORMAL HIGH (ref 11.5–15.5)
WBC: 5.5 10*3/uL (ref 4.0–10.5)
nRBC: 0 % (ref 0.0–0.2)

## 2019-07-20 LAB — COOXEMETRY PANEL
Carboxyhemoglobin: 2.9 % — ABNORMAL HIGH (ref 0.5–1.5)
Methemoglobin: 0.6 % (ref 0.0–1.5)
O2 Saturation: 60 %
Total hemoglobin: 8 g/dL — ABNORMAL LOW (ref 12.0–16.0)

## 2019-07-20 LAB — MAGNESIUM: Magnesium: 1.9 mg/dL (ref 1.7–2.4)

## 2019-07-20 MED ORDER — FUROSEMIDE 40 MG PO TABS
40.0000 mg | ORAL_TABLET | Freq: Every day | ORAL | Status: DC
  Administered 2019-07-21 – 2019-07-25 (×5): 40 mg via ORAL
  Filled 2019-07-20 (×5): qty 1

## 2019-07-20 MED ORDER — POTASSIUM CHLORIDE CRYS ER 20 MEQ PO TBCR
40.0000 meq | EXTENDED_RELEASE_TABLET | Freq: Once | ORAL | Status: AC
  Administered 2019-07-20: 40 meq via ORAL
  Filled 2019-07-20: qty 2

## 2019-07-20 NOTE — Progress Notes (Signed)
Nutrition Follow-up  DOCUMENTATION CODES:   Severe malnutrition in context of chronic illness  INTERVENTION:  - will d/c Boost Breeze per patient request. - continue Ensure Enlive BID and 30 ml prostat BID. - family bringing in most meals.   NUTRITION DIAGNOSIS:   Severe Malnutrition related to chronic illness (cancer and cancer related treatments) as evidenced by severe fat depletion, severe muscle depletion, edema. -ongoing  GOAL:   Patient will meet greater than or equal to 90% of their needs -improving  MONITOR:   PO intake, Supplement acceptance, Labs, Weight trends, Skin  ASSESSMENT:   64 year-old male with medical hx of CHF, large B-cell lymphoma, CAD s/p PCI, MI, afib, seizures. He quit smoking 17 months ago. He presented to the ED on 6/20 with 3 week hx of diarrhea, progressive weakness, and profound leg weakness and pallor. He was found to have febrile neutropenia and severe anemia.  Patient laying in bed at time of visit. He states his wife or his daughter usually come in the early afternoon and bring him a good meal/items he likes. He does not care for the food here but will usually eat fruit. He does not like Boost Breeze but does like chocolate Ensure and has been drinking at least one daily and has had a bottle this AM.   He reports that appetite is improved over the past 1 month but still decreased from his baseline. He attributes this to cancer treatment.   Per flow sheet documentation, moderate pitting edema to BUE and RLE and deep pitting edema to LLE.   Patient reports having multiple episodes of diarrhea daily and at night and that it interferes with his sleep. He was given imodium yesterday evening and this provided relief/improvement.   Labs reviewed; Cl: 93 mmol/l, Ca: 7.9 mg/dl. Medications reviewed; 40 mg oral lasix/day, PRN imodium, 40 mg IV protonix/day, 10 mEq IV KCl x4 runs 4/1, 40 mEq Klor-Con x2 doses 7/2.   Diet Order:   Diet Order             Diet regular Room service appropriate? Yes; Fluid consistency: Thin; Fluid restriction: 1500 mL Fluid  Diet effective now                 EDUCATION NEEDS:   Not appropriate for education at this time  Skin:  Skin Assessment: Skin Integrity Issues: Skin Integrity Issues:: Stage I, Stage II Stage I: sacrum Stage II: L scrotum, L buttocks  Last BM:  7/1  Height:   Ht Readings from Last 1 Encounters:  07/08/19 6' (1.829 m)    Weight:   Wt Readings from Last 1 Encounters:  07/18/19 78 kg     Estimated Nutritional Needs:  Kcal:  2200-2500 kcal Protein:  120-135 grams Fluid:  >/= 2.4 L/day     Jarome Matin, MS, RD, LDN, CNSC Inpatient Clinical Dietitian RD pager # available in AMION  After hours/weekend pager # available in [facility]

## 2019-07-20 NOTE — TOC Progression Note (Signed)
Transition of Care [facility]) - Progression Note    Patient Details  Name: Joe Gee MRN: [id number] Date of Birth: 11/18/1955  Transition of [facility]) CM/SW Contact  Leeroy Cha, RN Phone Number: 07/20/2019, 8:24 AM  Clinical Narrative:    Possible transfer to telemetry floor. Plan will follow for progression and toc needs.    Expected Discharge Plan: Home/Self Care Barriers to Discharge: Continued Medical Work up  Expected Discharge Plan and Services Expected Discharge Plan: Home/Self Care   Discharge Planning Services: CM Consult   Living arrangements for the past 2 months: Single Family Home                                       Social Determinants of Health (SDOH) Interventions    Readmission Risk Interventions No flowsheet data found.

## 2019-07-20 NOTE — Progress Notes (Signed)
[facility] ADULT ICU REPLACEMENT PROTOCOL   The patient does apply for the [facility] Adult ICU Electrolyte Replacment Protocol based on the criteria listed below:   1. Is GFR >/= 30 ml/min? Yes.    Patient's GFR today is 60 2. Is SCr </= 2? Yes.   Patient's SCr is 0.75 ml/kg/hr 3. Did SCr increase >/= 0.5 in 24 hours? 4. Abnormal electrolyte(s): K+3.5 5. Ordered repletion with: Protocol 6. If a panic level lab has been reported, has the CCM MD in charge been notified? Yes.  .   Physician:  Phylis Bougie 07/20/2019 5:54 AM

## 2019-07-20 NOTE — Progress Notes (Signed)
Progress Note  Patient Name: Tony Saunders Date of Encounter: 07/20/2019  Primary Cardiologist: Glenetta Hew, MD  Subjective   Resting comfortably on nebulizer machine. No CP or SOB. States he feels pretty good.  Inpatient Medications    Scheduled Meds: . amiodarone  200 mg Oral Daily  . budesonide (PULMICORT) nebulizer solution  0.5 mg Nebulization BID  . chlorhexidine  15 mL Mouth Rinse BID  . Chlorhexidine Gluconate Cloth  6 each Topical Daily  . clopidogrel  75 mg Oral Daily  . digoxin  0.125 mg Oral Daily  . feeding supplement  1 Container Oral BID BM  . feeding supplement (ENSURE ENLIVE)  237 mL Oral BID  . feeding supplement (PRO-STAT SUGAR FREE 64)  30 mL Oral TID BM  . furosemide  40 mg Intravenous Q12H  . ipratropium  0.5 mg Inhalation Q4H  . lamoTRIgine  100 mg Oral BID  . levalbuterol  1.25 mg Nebulization Q4H  . levETIRAcetam  750 mg Oral BID  . mouth rinse  15 mL Mouth Rinse q12n4p  . midodrine  5 mg Oral TID WC  . mirtazapine  15 mg Oral QHS  . multivitamin with minerals  1 tablet Oral Daily  . pantoprazole (PROTONIX) IV  40 mg Intravenous Q24H  . rivaroxaban  20 mg Oral Q breakfast  . rosuvastatin  5 mg Oral QHS  . sertraline  25 mg Oral Daily  . sodium chloride flush  10-40 mL Intracatheter Q12H   Continuous Infusions: . sodium chloride 10 mL/hr at 07/19/19 1800   PRN Meds: sodium chloride, docusate sodium, lip balm, loperamide, polyethylene glycol, sodium chloride flush   Vital Signs    Vitals:   07/20/19 0400 07/20/19 0500 07/20/19 0600 07/20/19 0700  BP: (!) 103/59 (!) 91/56 (!) 80/43 (!) 94/50  Pulse: 95 74 84 77  Resp: (!) 25 (!) 21 (!) 21 (!) 22  Temp: 97.6 F (36.4 C)     TempSrc: Oral     SpO2: 93% 97% 97% 98%  Weight:      Height:        Intake/Output Summary (Last 24 hours) at 07/20/2019 0747 Last data filed at 07/20/2019 0541 Gross per 24 hour  Intake 1816.8 ml  Output 3600 ml  Net -1783.2 ml   Last 3 Weights  07/18/2019 07/17/2019 07/16/2019  Weight (lbs) 171 lb 15.3 oz 171 lb 8.3 oz 178 lb 9.2 oz  Weight (kg) 78 kg 77.8 kg 81 kg     Telemetry    Yesterday appeared NSR, today more c/w atrial flutter - Personally Reviewed  ECG    Not available - Personally Reviewed  Physical Exam   GEN: Cachectic and chronically ill appearing HEENT: Normocephalic, atraumatic, sclera non-icteric. Neck: No JVD or bruits. Cardiac: Irregularly irregular, rate presently controlled, 2/6 soft blowing systolic murmur at apex Radials/DP/PT 1+ and equal bilaterally.  Respiratory: Clear to auscultation bilaterally. Breathing is unlabored. GI: Soft, nontender, non-distended, BS +x 4. MS: no deformity aside from cachexia Extremities: No clubbing or cyanosis. 2+ soft puffy bilateral edema although does appear slightly improved from yesterday. Distal pedal pulses are 2+ and equal bilaterally. Neuro:  AAOx3. Follows commands. Psych:  Responds to questions appropriately with a normal affect.  Labs    High Sensitivity Troponin:   Recent Labs  Lab 07/08/19 1416 07/08/19 1558  TROPONINIHS 96* 96*      Cardiac EnzymesNo results for input(s): TROPONINI in the last 168 hours. No results for input(s): TROPIPOC in  the last 168 hours.   Chemistry Recent Labs  Lab 07/16/19 0629 07/16/19 1738 07/17/19 0428 07/17/19 0428 07/18/19 1002 07/19/19 0424 07/20/19 0515  NA 139   < > 137   < > 138 140 138  K 2.5*   < > 5.2*   < > 3.3* 2.8* 3.5  CL 98   < > 97*   < > 94* 94* 93*  CO2 33*   < > 29   < > 34* 35* 36*  GLUCOSE 82   < > 136*   < > 116* 83 82  BUN 25*   < > 24*   < > 22 17 19   CREATININE 0.92   < > 0.81   < > 0.80 0.74 0.75  CALCIUM 7.2*   < > 7.1*   < > 7.6* 7.3* 7.9*  PROT 4.3*  --  4.3*  --  4.8*  --   --   ALBUMIN 2.0*  --  2.0*  --  2.4*  --   --   AST 26  --  23  --  30  --   --   ALT 29  --  28  --  33  --   --   ALKPHOS 70  --  69  --  80  --   --   BILITOT 0.5  --  0.7  --  0.5  --   --   GFRNONAA  >60   < > >60   < > >60 >60 >60  GFRAA >60   < > >60   < > >60 >60 >60  ANIONGAP 8   < > 11   < > 10 11 9    < > = values in this interval not displayed.     Hematology Recent Labs  Lab 07/18/19 1002 07/19/19 0424 07/20/19 0515  WBC 7.4 7.4 5.5  RBC 3.00* 3.05* 2.93*  HGB 8.5* 8.5* 8.3*  HCT 27.0* 27.7* 26.9*  MCV 90.0 90.8 91.8  MCH 28.3 27.9 28.3  MCHC 31.5 30.7 30.9  RDW 20.7* 20.9* 21.1*  PLT 81* 70* 94*    BNP Recent Labs  Lab 07/14/19 0500 07/16/19 0629  BNP 608.8* 675.9*     DDimer No results for input(s): DDIMER in the last 168 hours.   Radiology    No results found.  Cardiac Studies   06/24/19 Echo 1. Left ventricular ejection fraction, by estimation, is 35 to 40%. The  left ventricle has moderately decreased function. The left ventricle  demonstrates global hypokinesis. The left ventricular internal cavity size  was mildly dilated. Left ventricular  diastolic parameters are indeterminate.  2. Right ventricular systolic function is normal. The right ventricular  size is normal. Tricuspid regurgitation signal is inadequate for assessing  PA pressure.  3. Left atrial size was mildly dilated.  4. Right atrial size was mildly dilated.  5. The mitral valve is normal in structure. Severe mitral valve  regurgitation from teathering of posterior MV leaflet. No evidence of  mitral stenosis.  6. The aortic valve is tricuspid. Aortic valve regurgitation is not  visualized. Mild to moderate aortic valve sclerosis/calcification is  present, without any evidence of aortic stenosis.  7. The inferior vena cava is normal in size with greater than 50%  respiratory variability, suggesting right atrial pressure of 3 mmHg.  8. Large pleural effusion in the left lateral region. No pericardial  effusion.   Patient Profile     64 y.o. 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 He was recently admitted 5/28-6/12 with pancytopenia with recurrent atrial fib/flutter, a/c CHF, colonic ileus, oropharyngeal canddiasis, and hypotension requiring midodrine. He was readmitted 07/08/19 with weakness, fever, pancytopenia/neutropenia, heme positive stools, acute on chronic respiratory failure with hypoxemia, and acute on chronic heart failure. Found to have acute rhinovirus pneumonia and volume overload requiring diuresis. Also required initiation of milrinone. Other issues as outlined in notes with electrolyte abnormalities, pressure ulcer, severe protein calorie malnutrition with hypoalbuminemia.  Assessment & Plan    1. Acute on chronic respiratory failure with rhinovirus positive pneumonia, pancytopenia and diffuse large B cell lymphoma - concomitantly followed by PCCM, oncology and palliative to aid in [place] discussions. Per oncology, patient still too debilitated to restart chemotherapy quite yet so this has been postponed.  2. Acute on chronic combined CHF with severe mitral regurgitation - Echo this admission with EF 35-40% with severe MR, previously EF 30-35% with moderate-severe MR. He has required inotropic support and BiPAP this admission. Diuresis and guideline directed management has been limited by hypotension. Required milrinone, discontinued 7/1.  -13.8L thus far, peak weight 188lb, at 171.9 on 6/30 - have requested ongoing weight.  Co-ox 60. - Hypoalbuminemia likely contributing to ongoing edema  - CVP reported at 6 and BP remains soft at times so will hold further IV Lasix and review plan with MD (remains on midodrine)  3. Persistent atrial fib (with history of atrial flutter) - continued on amiodarone and digoxin  for now. He has failed prior cardioversions with reversion to atrial fib. With variable electrolytes, unlikely he is a candidate for dofetilide.  - With severe MR, left atrial enlargement, and continued medical illnesses, it may be difficult to keep him in sinus rhythm.  If ultimately planned for rate control only, would consider stopping amiodarone.  - Continue Xarelto at present dose (CrCl wnl) - He was felt to be in possible NSR yesterday but EKG does not appear to have been done - still ordered for this AM but he is now more clearly in what appears to be atrial flutter  4. CAD - no complaints of angina this admission - previous DAPT changed to Plavix monotherapy in context of needing anticoagulation for the above (and recent PCI) - Continue low dose Crestor - BP too low for beta blocker  5. Anemia/thrombocytopenia with heme-positive stools this admission - Hgb has been relatively stable the last several days. Outpatient GI workup has been suggested. Plt count improving to 94, Hgb in mid 8 range - Continue to monitor on blood thinner therapy  6. Hypokalemia (with recent hyperkalemia), hypocalcemia - lyte management per primary team.  For questions or updates, please contact [place] Please consult [email or web address] for contact info under Cardiology/STEMI.  Signed, Charlie Pitter, PA-C 07/20/2019, 7:47 AM

## 2019-07-20 NOTE — Progress Notes (Signed)
Occupational Therapy Treatment Patient Details Name: Tony Saunders MRN: [id number] DOB: 15-Mar-1955 Today's Date: 07/20/2019    History of present illness Pt to ED 2* weakness and admitted with acute hypoxemic respiratory failure, acute decompensated, systolic CHF and severe pancytopenia 2* chemo and increasing L LE edema 2* ongoing Lymphoma.  Pt with hx of MI, seizure disorder, CHF, PAF - pt underwent Cardioversion 06/22/19   OT comments  Pt presented in supine and agreeable to attempting up to recliner once shown pressure relief waffle cushion to address buttock soreness.  Pt demonstrated steady progress with bed mobility and functional mobility in preparation for ADLs.  Pt remains with good rehab potential and should benefit from continued skilled OT services.         Follow Up Recommendations  Other (comment) (Home health vs SNF depending on progress.)    Equipment Recommendations    BSC, 2 wheeled RW   Recommendations for Other Services      Precautions / Restrictions Precautions Precautions: Fall Precaution Comments: very frail, painful left foot. knees subject to buckle.  DROPLET Restrictions Weight Bearing Restrictions: No       Mobility Bed Mobility Overal bed mobility: Needs Assistance Bed Mobility: Supine to Sit     Supine to sit: Min assist;HOB elevated (Assist needed to initially move LEs. Feet partially wedged on footboard.)     General bed mobility comments: Increased time and effort with transitions.  Ant scooting to EOB with Mod As and use of chuck pad.  Transfers Overall transfer level: Needs assistance Equipment used: Rolling walker (2 wheeled) Transfers: Sit to/from Stand Sit to Stand: Mod assist;From elevated surface;+2 physical assistance Stand pivot transfers: Min assist;+2 safety/equipment       General transfer comment: Stand to sit: Min As of 2 people with verbal cues for BUE reach back for controlled descent.    Balance Overall balance  assessment: Needs assistance;History of Falls Sitting-balance support: Feet supported;Bilateral upper extremity supported Sitting balance-Leahy Scale: Fair Sitting balance - Comments: Affected by fatigue.     Standing balance-Leahy Scale: Poor Standing balance comment: reliant on external support                           ADL either performed or assessed with clinical judgement   ADL   Eating/Feeding: Set up;Sitting   Grooming: Set up;Sitting   Upper Body Bathing: Minimal assistance;Sitting;Bed level       Upper Body Dressing : Minimal assistance;Moderate assistance;Sitting   Lower Body Dressing: Total assistance;Bed level (Donned and doffed socks to test edema with total assist with pt in bed then recliner.)   Toilet Transfer: Moderate assistance;+2 for physical assistance;Cueing for safety Toilet Transfer Details (indicate cue type and reason): Pt stood from low bed to RW and verbal cues for hand placement and Mod As of 2 people.         Functional mobility during ADLs: Rolling walker;Cueing for safety;Cueing for sequencing;+2 for safety/equipment;Minimal assistance (Stand pivot to RW from EOB to recliner with 3 lateral steps, then pivot with Min Assist of 2 for line mngt.)       Vision   Vision Assessment?: No apparent visual deficits              Cognition Arousal/Alertness: Awake/alert Behavior During Therapy: WFL for tasks assessed/performed Overall Cognitive Status: Within Functional Limits for tasks assessed  General Comments: No deficicts noted this visit.        Exercises General Exercises - Lower Extremity Ankle Circles/Pumps: AROM;10 reps;Both;Seated                Pertinent Vitals/ Pain       Pain Assessment: Faces Faces Pain Scale: Hurts a little bit Pain Location: Pt denied any pain, but did mention buttock soreness as reason to initially decline recliner.  Provided pt with pressure  relief cushion to chair, and pt then agreed. Pain Descriptors / Indicators: Sore;Discomfort Pain Intervention(s): Repositioned;Other (comment);Limited activity within patient's tolerance;Monitored during session (Waffle cushion to chair)                                                          Frequency    Min 2x/wk       Progress Toward Goals  OT Goals(current goals can now be found in the care plan section)  Progress towards OT goals: Progressing toward goals  Acute Rehab OT Goals Patient Stated Goal: To walk OT Goal Formulation: With patient Time For Goal Achievement: 08/01/19 Potential to Achieve Goals: [place] Discharge plan remains appropriate                    AM-PAC OT "6 Clicks" Daily Activity     Outcome Measure   Help from another person eating meals?: None Help from another person taking care of personal grooming?: A Little Help from another person toileting, which includes using toliet, bedpan, or urinal?: A Lot Help from another person bathing (including washing, rinsing, drying)?: A Lot Help from another person to put on and taking off regular upper body clothing?: A Little Help from another person to put on and taking off regular lower body clothing?: Total 6 Click Score: 15    End of Session Equipment Utilized During Treatment: Rolling walker;Oxygen  OT Visit Diagnosis: Muscle weakness (generalized) (M62.81);Pain   Activity Tolerance Patient tolerated treatment well;No increased pain;Patient limited by fatigue   Patient Left in chair;with call bell/phone within reach   Nurse Communication Mobility status        Time: 1130-1157 OT Time Calculation (min): 27 min  Charges: OT Treatments $Self Care/Home Management : 8-22 mins $Therapeutic Activity: 8-22 mins  Anderson [place], [place] Office: [phone] 07/20/2019    Julien Girt 07/20/2019, 12:24 PM

## 2019-07-20 NOTE — Progress Notes (Signed)
NAME:  Tony Saunders, MRN:  [id number], DOB:  1955/09/28, LOS: 12 ADMISSION DATE:  07/08/2019, CONSULTATION DATE:  6/20 REFERRING MD:  Ashok Cordia, CHIEF COMPLAINT:  Weakness   Brief History   64 y/o male admitted on 6/20 with three weeks of diarrhea and weakness, noted on admission to have pancytopenia and fever.  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 On BiPAP 24/7/ Wants to trial off bipap Took off for 5 minutes and was able to converse without significant distress. Wife at bedside.  K severely low  Mag < 2gm%.  Off levophed. Off precredex gt. On lasix, digoxin, amio tab, plavix and xaretlo. On hydrocort since 6/20 (not on chronic steroids other than during chemo cycles ).  Per onc cheduled for next chemo in 1 week  6/27 - - RVP = positive Rhinovirus.   Still on milrionone gtt. On oral -> midodrine, lasix, digoxin, amio tab, plavix and xaretlo. On hydrocort since 6/20 (not on chronic steroids other than during  chemo cycles ) - reduced yeserday. Critically low phos repleted yesterday.  Respn: able to tolerate intermitten several hours off bipap yesterday. Got BiPAP QHS. This am-> now on HHFNC 20L and 30-50% fio2. Feeling better.  6/30 several days of diuresis, feeling better, oxygenation improved  7/2 he does continue to improve, off milrinone 7/1  Consults:  Cardiology Palliative medicine   Procedures:  PICC 5/18 >   Significant Diagnostic Tests:  6/6 TTE > LVEF 35-40%, modeartely decreased function on left, global hypokinesis, RV systolic function normal, bilateral atrium dilated, severe MR, large pleural effusion  Micro Data:  6/20 SARS COV 2 > negative 6/20 blood >  6/21 urine culture 6/27 rhinovirus/enterovirus> positive  Antimicrobials:  6/20-6/28 cefepime  6/20 flagyl > 6/26 (off 6/24) 6/20 vanc > 6/25  Interim history/subjective:   Feels better Not sleeping well in the hospital secondary to being woken up many times Breathing feels better, coughing bringing up secretions  Objective   Blood pressure (!) 97/56, pulse 78, temperature (!) 97.5 F (36.4 C), temperature source Oral, resp. rate (!) 21, height 6' (1.829 m), weight 78 kg, SpO2 100 %. CVP:  [6 mmHg] 6 mmHg      Intake/Output Summary (Last 24 hours) at 07/20/2019 1052 Last data filed at 07/20/2019 0959 Gross per 24 hour  Intake 1839.96 ml  Output 4225 ml  Net -2385.04 ml   Filed Weights   07/16/19 0500 07/17/19 0430 07/18/19 0400  Weight: 81  kg 77.8 kg 78 kg    Examination:  General: Frail, comfortable in bed with no distress HENT: Moist oral mucosa, clear PULM: Rhonchi, decreased at bases CV: S1-S2 appreciated GI: BS+, soft, nontender MSK: Decreased muscle bulk Neuro: awake, alert, no distress, MAEW  6/30 COOX (SvO2) 69%, 7/1-58%  6/29 CXR > reviewed by myself, trace effusion on R, bilateral airspace disease persists  Resolved Hospital Problem list     Assessment & Plan:  Acute on chronic  respiratory failure Rhinovirus pneumonia Decompensated heart failure Possible obstructive lung disease at baseline -Continue Pulmicort, Xopenex, Atrovent -Out of bed to chair as tolerated  Acute decompensated systolic heart failure Severe mitral regurg History of coronary artery diseasenone -Continue Plavix -lasix switched to oral -Continue midodrine -off milrinone  Hypokalemia, hypophosphatemia -Continue repletion   Atrial fibrillation Tele Xarelto, digoxin, amiodarone  Pancytopenia in setting of chemotherapy use for lymphoma> improving counts No evidence of brisk bleeding but fecal occult blood test positive on stool card on admission Transfuse PRBC for Hgb < 7 gm/dL Outpatient GI work up   Diffuse large cell lymphoma Hold immunosuppressive cancer treatment given failty Oncology continues to follow -Plan is to possibly continue chemotherapy second week of July  Pressure ulcer present on admission Wound care  Seizure disorder Continue home lamictal, keppra  Severe protein calorie malnutrition Continue regular diet + ensure/boost/prostat/MVI   Best practice:  Diet: regular diet Pain/Anxiety/Delirium protocol (if indicated): n/a VAP protocol (if indicated): n/a DVT prophylaxis: SCD GI prophylaxis: protonix Glucose control: monitor  Mobility: out of bed Code Status: short term intubation OK, no CPR if cardiac arrest Family Communication: discussed fully with patient Disposition: remain in ICU  Labs   CBC: Recent Labs  Lab 07/16/19 0629 07/17/19 0428 07/18/19 1002 07/19/19 0424 07/20/19 0515  WBC 7.4 7.0 7.4 7.4 5.5  NEUTROABS  --  6.2  --   --   --   HGB 7.7* 7.8* 8.5* 8.5* 8.3*  HCT 24.7* 25.2* 27.0* 27.7* 26.9*  MCV 90.1 91.3 90.0 90.8 91.8  PLT 58* 66* 81* 70* 94*    Basic Metabolic Panel: Recent Labs  Lab 07/14/19 0500 07/14/19 0500 07/15/19 0030 07/15/19 0030 07/16/19 0629 07/16/19 0629 07/16/19 1738 07/17/19 0428 07/17/19 2045  07/18/19 0407 07/18/19 1002 07/19/19 0424 07/20/19 0515  NA 140   < > 138   < > 139   < > 141 137  --   --  138 140 138  K 2.8*   < > 3.5   < > 2.5*   < > 2.8* 5.2*  --   --  3.3* 2.8* 3.5  CL 102   < > 99   < > 98   < > 96* 97*  --   --  94* 94* 93*  CO2 29   < > 31   < > 33*   < > 32 29  --   --  34* 35* 36*  GLUCOSE 116*   < > 113*   < > 82   < > 146* 136*  --   --  116* 83 82  BUN 24*   < > 22   < > 25*   < > 28* 24*  --   --  22 17 19   CREATININE 0.93   < > 0.90   < > 0.92   < > 0.94 0.81  --   --  0.80 0.74 0.75  CALCIUM 7.2*   < > 7.0*   < >  7.2*   < > 7.3* 7.1*  --   --  7.6* 7.3* 7.9*  MG 1.9  --  2.1  --  2.1  --   --   --   --   --   --   --  1.9  PHOS  --   --  1.0*  --  2.0*  --   --  1.3* 2.9 2.7  --   --   --    < > = values in this interval not displayed.   GFR: Estimated Creatinine Clearance: 105.1 mL/min (by C-G formula based on SCr of 0.75 mg/dL). Recent Labs  Lab 07/13/19 1352 07/14/19 0500 07/15/19 0030 07/17/19 0428 07/18/19 1002 07/19/19 0424 07/20/19 0515  PROCALCITON 0.69 0.44  --   --   --   --   --   WBC  --  7.3   < > 7.0 7.4 7.4 5.5   < > = values in this interval not displayed.    Liver Function Tests: Recent Labs  Lab 07/16/19 0629 07/17/19 0428 07/18/19 1002  AST 26 23 30   ALT 29 28 33  ALKPHOS 70 69 80  BILITOT 0.5 0.7 0.5  PROT 4.3* 4.3* 4.8*  ALBUMIN 2.0* 2.0* 2.4*   No results for input(s): LIPASE, AMYLASE in the last 168 hours. No results for input(s): AMMONIA in the last 168 hours.  ABG    Component Value Date/Time   HCO3 22.0 03/29/2019 1012   TCO2 23 03/29/2019 1012   ACIDBASEDEF 2.0 03/29/2019 1012   O2SAT 60.0 07/20/2019 0515     Coagulation Profile: Recent Labs  Lab 07/16/19 0629  INR 1.3*    Cardiac Enzymes: No results for input(s): CKTOTAL, CKMB, CKMBINDEX, TROPONINI in the last 168 hours.  HbA1C: Hgb A1c MFr Bld  Date/Time Value Ref Range Status  03/27/2019 07:06 AM 5.3 4.8 - 5.6 % Final    Comment:      (NOTE) Pre diabetes:          5.7%-6.4% Diabetes:              >6.4% Glycemic control for   <7.0% adults with diabetes     CBG: No results for input(s): GLUCAP in the last 168 hours.  The patient is critically ill with multiple organ systems failure and requires high complexity decision making for assessment and support, frequent evaluation and titration of therapies, application of advanced monitoring technologies and extensive interpretation of multiple databases. Critical Care Time devoted to patient care services described in this note independent of APP/resident time (if applicable)  is 30 minutes.   Sherrilyn Rist MD [facility] Personal pager: [phone] If unanswered, please page CCM On-call: [phone]

## 2019-07-21 LAB — BASIC METABOLIC PANEL
Anion gap: 9 (ref 5–15)
BUN: 18 mg/dL (ref 8–23)
CO2: 33 mmol/L — ABNORMAL HIGH (ref 22–32)
Calcium: 7.5 mg/dL — ABNORMAL LOW (ref 8.9–10.3)
Chloride: 92 mmol/L — ABNORMAL LOW (ref 98–111)
Creatinine, Ser: 0.81 mg/dL (ref 0.61–1.24)
GFR calc Af Amer: >60 mL/min (ref >60)
GFR calc non Af Amer: >60 mL/min (ref >60)
Glucose, Bld: 77 mg/dL (ref 70–99)
Potassium: 4 mmol/L (ref 3.5–5.1)
Sodium: 134 mmol/L — ABNORMAL LOW (ref 135–145)

## 2019-07-21 LAB — CBC
HCT: 23.7 % — ABNORMAL LOW (ref 39.0–52.0)
Hemoglobin: 7.3 g/dL — ABNORMAL LOW (ref 13.0–17.0)
MCH: 28.3 pg (ref 26.0–34.0)
MCHC: 30.8 g/dL (ref 30.0–36.0)
MCV: 91.9 fL (ref 80.0–100.0)
Platelets: 97 10*3/uL — ABNORMAL LOW (ref 150–400)
RBC: 2.58 MIL/uL — ABNORMAL LOW (ref 4.22–5.81)
RDW: 21.2 % — ABNORMAL HIGH (ref 11.5–15.5)
WBC: 5 10*3/uL (ref 4.0–10.5)
nRBC: 0 % (ref 0.0–0.2)

## 2019-07-21 LAB — MAGNESIUM: Magnesium: 1.9 mg/dL (ref 1.7–2.4)

## 2019-07-21 MED ORDER — IPRATROPIUM BROMIDE 0.02 % IN SOLN
0.5000 mg | Freq: Three times a day (TID) | RESPIRATORY_TRACT | Status: DC
  Administered 2019-07-21 – 2019-07-25 (×12): 0.5 mg via RESPIRATORY_TRACT
  Filled 2019-07-21 (×13): qty 2.5

## 2019-07-21 MED ORDER — LEVALBUTEROL HCL 1.25 MG/0.5ML IN NEBU
1.2500 mg | INHALATION_SOLUTION | Freq: Three times a day (TID) | RESPIRATORY_TRACT | Status: DC
  Administered 2019-07-21 – 2019-07-25 (×12): 1.25 mg via RESPIRATORY_TRACT
  Filled 2019-07-21 (×14): qty 0.5

## 2019-07-21 NOTE — Progress Notes (Signed)
Physical Therapy Treatment Patient Details Name: Tony Saunders MRN: [id number] DOB: [birth year] Today's Date: 07/21/2019    History of Present Illness Pt to ED 2* weakness and admitted with acute hypoxemic respiratory failure, acute decompensated, systolic CHF and severe pancytopenia 2* chemo and increasing L LE edema 2* ongoing Lymphoma.  Pt with hx of MI, seizure disorder, CHF, PAF - pt underwent Cardioversion 06/22/19    PT Comments    Pt continues very motivated and with noted increased activity tolerance this date allowing pt to ambulate increased distances in room.  Pt and spouse pleased with progress this date.   Follow Up Recommendations  Home health PT;SNF     Equipment Recommendations  None recommended by PT    Recommendations for Other Services OT consult     Precautions / Restrictions Precautions Precautions: Fall Precaution Comments: very frail, painful left foot. knees subject to buckle.  DROPLET Restrictions Weight Bearing Restrictions: No    Mobility  Bed Mobility Overal bed mobility: Needs Assistance Bed Mobility: Supine to Sit;Sit to Supine     Supine to sit: Min assist;HOB elevated Sit to supine: Min assist   General bed mobility comments: Increased time and effort with transitions.  Min assist to complete transition to EOB useing pad  Transfers Overall transfer level: Needs assistance Equipment used: Rolling walker (2 wheeled) Transfers: Sit to/from Stand Sit to Stand: Min assist;Mod assist;+2 physical assistance;+2 safety/equipment;From elevated surface         General transfer comment: cues for LE management, safety awareness and use of UEs to self assist  Ambulation/Gait Ambulation/Gait assistance: +2 physical assistance;+2 safety/equipment;Min assist Gait Distance (Feet): 20 Feet (and additional 14') Assistive device: Rolling walker (2 wheeled) Gait Pattern/deviations: Shuffle;Trunk flexed;Step-through pattern;Decreased stride  length Gait velocity: decr   General Gait Details: cues for posture and position from RW.  Physical assist for balance/support and RW management   Stairs             Wheelchair Mobility    Modified Rankin (Stroke Patients Only)       Balance Overall balance assessment: Needs assistance;History of Falls Sitting-balance support: Feet supported;Bilateral upper extremity supported Sitting balance-Leahy Scale: Fair     Standing balance support: Bilateral upper extremity supported;During functional activity Standing balance-Leahy Scale: Poor Standing balance comment: reliant on external support                            Cognition Arousal/Alertness: Awake/alert Behavior During Therapy: WFL for tasks assessed/performed Overall Cognitive Status: Within Functional Limits for tasks assessed                                 General Comments: No deficicts noted this visit.      Exercises      General Comments        Pertinent Vitals/Pain Pain Assessment: No/denies pain Pain Location: Pt denies pain but declines to sit up in recliner stating it hes buttocks get too sore    Home Living                      Prior Function            PT Goals (current goals can now be found in the care plan section) Acute Rehab PT Goals Patient Stated Goal: To walk PT Goal Formulation: With patient Time For Goal Achievement: 07/29/19 Potential to  Achieve Goals: Fair Progress towards PT goals: Progressing toward goals    Frequency    Min 3X/week      PT Plan Current plan remains appropriate    Co-evaluation              AM-PAC PT "6 Clicks" Mobility   Outcome Measure  Help needed turning from your back to your side while in a flat bed without using bedrails?: A Little Help needed moving from lying on your back to sitting on the side of a flat bed without using bedrails?: A Little Help needed moving to and from a bed to a chair  (including a wheelchair)?: A Lot Help needed standing up from a chair using your arms (e.g., wheelchair or bedside chair)?: A Lot Help needed to walk in hospital room?: A Lot Help needed climbing 3-5 steps with a railing? : A Lot 6 Click Score: 14    End of Session Equipment Utilized During Treatment: Gait belt;Oxygen Activity Tolerance: Patient limited by fatigue Patient left: in bed;with call bell/phone within reach;with family/visitor present Nurse Communication: Mobility status PT Visit Diagnosis: Difficulty in walking, not elsewhere classified (R26.2);Muscle weakness (generalized) (M62.81)     Time: 7322-0254 PT Time Calculation (min) (ACUTE ONLY): 27 min  Charges:  $Gait Training: 23-37 mins                     [place] Pager [phone] Office [phone]    Jaelie Aguilera 07/21/2019, 4:36 PM

## 2019-07-21 NOTE — Progress Notes (Addendum)
PROGRESS NOTE    Tony Saunders  MAU:[id number] DOB: 12/29/1955    DOA: 07/08/2019   PCP: [name], [name], MD   Brief Narrative:  Tony Saunders is a 64 y/o male admitted on 6/20 with three weeks of diarrhea and weakness, noted on admission to have pancytopenia and fever.  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 He was admitted in [place] ended up going to heart failure and had to be placed on a milrinone drip. Milrinone drip has been titrated off and is relatively stable, however he did test positive for rhinovirus which likely will maintain his cardiac issues. He became agitated requiring Precedex drip briefly and Levophed for hypotension.  He is off Levophed now. He is extremely deconditioned and needs to remain in the hospital for strengthening and for the PT OT evaluation. Need to touch base with oncology when to start his chemotherapy as a mention second week of July.  Assessment & Plan:   Active Problems:   Acute on chronic systolic heart failure (HCC)   Pressure ulcer   Cardiogenic shock (HCC)   Acute respiratory failure with hypoxia (HCC)   Severe sepsis (HCC)   Neutropenic sepsis (HCC)   Persistent atrial fibrillation (HCC)  Acute on chronic respiratory failure multifactorial:   Causes rhinovirus pneumonia /Decompensated heart failure , Possible obstructive lung disease at baseline - Continue Pulmicort, Xopenex, Atrovent -Out of bed to chair as tolerated.  Acute decompensated systolic heart failure Severe mitral regurg Echo:  LVEF 35-40%  global hypokinesis,severe MR, large pleural effusion -Continue Plavix. -lasix switched to oral -Continue midodrine -off milrinone infusion.  Hypokalemia, hypophosphatemia -Continue repletion.   Atrial fibrillation Continue Tele Xarelto, digoxin, amiodarone. Cardiology following  Pancytopenia in setting of chemotherapy  use for lymphoma> improving counts No evidence of brisk bleeding but fecal occult blood test positive on stool card on admission Transfuse PRBC for Hgb < 7 gm/dL Outpatient GI work up.  Diffuse large cell lymphoma Hold immunosuppressive cancer treatment given frailty Oncology continues to follow -Plan is to possibly continue chemotherapy second week of July  Pressure ulcer present on admission Wound care.  Seizure disorder Continue home lamictal, keppra  Severe protein calorie malnutrition Continue regular diet + ensure/boost/prostat/MVI   DVT prophylaxis: Xarelto Code Status: Full code Family Communication: Discussed with patient and the daughter at bedside Disposition Plan: Anticipated discharge to skilled nursing facility.  Dispo: The patient is from:Home Anticipated d/c is to:SNF or Home with home PT Anticipated d/c date is:1-2 days Patient currently is not medically stable to d/c.   Consultants:   PCCM, oncology, cardiology  Procedures:  Antimicrobials: Anti-infectives (From admission, onward)   Start     Dose/Rate Route Frequency Ordered Stop   07/13/19 2200  vancomycin (VANCOREADY) IVPB 750 mg/150 mL  Status:  Discontinued        750 mg 150 mL/hr over 60 Minutes Intravenous Every 12 hours 07/13/19 1328 07/13/19 1609   07/13/19 2200  vancomycin (VANCOCIN) IVPB 1000 mg/200 mL premix  Status:  Discontinued        1,000 mg 200 mL/hr over 60 Minutes Intravenous Every 12 hours 07/13/19 1609 07/15/19 1053   07/13/19 1200  metroNIDAZOLE (FLAGYL) IVPB 500 mg  Status:  Discontinued        500 mg 100 mL/hr over 60 Minutes Intravenous Every 8 hours 07/13/19 0851 07/15/19 1053   07/13/19 1000  vancomycin (VANCOREADY) IVPB 750 mg/150 mL  Status:  Discontinued  750 mg 150 mL/hr over 60 Minutes Intravenous Every 8 hours 07/13/19 0851 07/13/19 1328   07/13/19 0900  ceFEPIme (MAXIPIME) 2 g in sodium chloride 0.9 % 100 mL  IVPB  Status:  Discontinued        2 g 200 mL/hr over 30 Minutes Intravenous Every 8 hours 07/13/19 0849 07/16/19 1116   07/09/19 0900  ceFEPIme (MAXIPIME) 2 g in sodium chloride 0.9 % 100 mL IVPB  Status:  Discontinued        2 g 200 mL/hr over 30 Minutes Intravenous Every 8 hours 07/09/19 0846 07/12/19 1150   07/09/19 0600  metroNIDAZOLE (FLAGYL) IVPB 500 mg  Status:  Discontinued        500 mg 100 mL/hr over 60 Minutes Intravenous Every 8 hours 07/08/19 1819 07/12/19 1044   07/09/19 0200  vancomycin (VANCOREADY) IVPB 750 mg/150 mL  Status:  Discontinued        750 mg 150 mL/hr over 60 Minutes Intravenous Every 8 hours 07/08/19 1834 07/12/19 1150   07/08/19 2200  ceFEPIme (MAXIPIME) 2 g in sodium chloride 0.9 % 100 mL IVPB  Status:  Discontinued        2 g 200 mL/hr over 30 Minutes Intravenous Every 8 hours 07/08/19 1825 07/09/19 0846   07/08/19 1415  vancomycin (VANCOREADY) IVPB 1750 mg/350 mL        1,750 mg 175 mL/hr over 120 Minutes Intravenous  Once 07/08/19 1400 07/08/19 1825   07/08/19 1400  ceFEPIme (MAXIPIME) 2 g in sodium chloride 0.9 % 100 mL IVPB        2 g 200 mL/hr over 30 Minutes Intravenous  Once 07/08/19 1352 07/08/19 1542   07/08/19 1400  metroNIDAZOLE (FLAGYL) IVPB 500 mg        500 mg 100 mL/hr over 60 Minutes Intravenous  Once 07/08/19 1352 07/08/19 1954   07/08/19 1400  vancomycin (VANCOCIN) IVPB 1000 mg/200 mL premix  Status:  Discontinued        1,000 mg 200 mL/hr over 60 Minutes Intravenous  Once 07/08/19 1352 07/08/19 1400      Subjective: Patient was seen and examined at bedside.  No overnight events.  He reports feeling much better and wants to be discharged.  Objective: Vitals:   07/21/19 1011 07/21/19 1053 07/21/19 1200 07/21/19 1427  BP: (!) 121/104 (!) 93/59 97/62 100/68  Pulse: 78 81 98 98  Resp: 18 16  18   Temp: 98.1 F (36.7 C)   98.5 F (36.9 C)  TempSrc: Oral   Oral  SpO2: 99% 99% 98% 99%  Weight:      Height:        Intake/Output  Summary (Last 24 hours) at 07/21/2019 1532 Last data filed at 07/21/2019 1131 Gross per 24 hour  Intake 807.36 ml  Output 400 ml  Net 407.36 ml   Filed Weights   07/18/19 0400 07/20/19 1600 07/21/19 0248  Weight: 78 kg 75.5 kg 76.6 kg    Examination:  General exam: Appears calm and comfortable  Respiratory system: Clear to auscultation. Respiratory effort normal. Cardiovascular system: S1 & S2 heard, RRR. No JVD, murmurs, rubs, gallops or clicks. No pedal edema. Gastrointestinal system: Abdomen is nondistended, soft and nontender. No organomegaly or masses felt. Normal bowel sounds heard. Central nervous system: Alert and oriented. No focal neurological deficits. Extremities: No edema, no swelling , no cyanosis. Skin: No rashes, lesions or ulcers Psychiatry: Judgement and insight appear normal. Mood & affect appropriate.     Data  Reviewed: I have personally reviewed following labs and imaging studies  CBC: Recent Labs  Lab 07/17/19 0428 07/18/19 1002 07/19/19 0424 07/20/19 0515 07/21/19 0356  WBC 7.0 7.4 7.4 5.5 5.0  NEUTROABS 6.2  --   --   --   --   HGB 7.8* 8.5* 8.5* 8.3* 7.3*  HCT 25.2* 27.0* 27.7* 26.9* 23.7*  MCV 91.3 90.0 90.8 91.8 91.9  PLT 66* 81* 70* 94* 97*   Basic Metabolic Panel: Recent Labs  Lab 07/15/19 0030 07/15/19 0030 07/16/19 0629 07/16/19 1738 07/17/19 0428 07/17/19 2045 07/18/19 0407 07/18/19 1002 07/19/19 0424 07/20/19 0515 07/21/19 0356  NA 138   < > 139   < > 137  --   --  138 140 138 134*  K 3.5   < > 2.5*   < > 5.2*  --   --  3.3* 2.8* 3.5 4.0  CL 99   < > 98   < > 97*  --   --  94* 94* 93* 92*  CO2 31   < > 33*   < > 29  --   --  34* 35* 36* 33*  GLUCOSE 113*   < > 82   < > 136*  --   --  116* 83 82 77  BUN 22   < > 25*   < > 24*  --   --  22 17 19 18   CREATININE 0.90   < > 0.92   < > 0.81  --   --  0.80 0.74 0.75 0.81  CALCIUM 7.0*   < > 7.2*   < > 7.1*  --   --  7.6* 7.3* 7.9* 7.5*  MG 2.1  --  2.1  --   --   --   --   --   --   1.9 1.9  PHOS 1.0*  --  2.0*  --  1.3* 2.9 2.7  --   --   --   --    < > = values in this interval not displayed.   GFR: Estimated Creatinine Clearance: 102.4 mL/min (by C-G formula based on SCr of 0.81 mg/dL). Liver Function Tests: Recent Labs  Lab 07/16/19 0629 07/17/19 0428 07/18/19 1002  AST 26 23 30   ALT 29 28 33  ALKPHOS 70 69 80  BILITOT 0.5 0.7 0.5  PROT 4.3* 4.3* 4.8*  ALBUMIN 2.0* 2.0* 2.4*   No results for input(s): LIPASE, AMYLASE in the last 168 hours. No results for input(s): AMMONIA in the last 168 hours. Coagulation Profile: Recent Labs  Lab 07/16/19 0629  INR 1.3*   Cardiac Enzymes: No results for input(s): CKTOTAL, CKMB, CKMBINDEX, TROPONINI in the last 168 hours. BNP (last 3 results) No results for input(s): PROBNP in the last 8760 hours. HbA1C: No results for input(s): HGBA1C in the last 72 hours. CBG: No results for input(s): GLUCAP in the last 168 hours. Lipid Profile: No results for input(s): CHOL, HDL, LDLCALC, TRIG, CHOLHDL, LDLDIRECT in the last 72 hours. Thyroid Function Tests: No results for input(s): TSH, T4TOTAL, FREET4, T3FREE, THYROIDAB in the last 72 hours. Anemia Panel: No results for input(s): VITAMINB12, FOLATE, FERRITIN, TIBC, IRON, RETICCTPCT in the last 72 hours. Sepsis Labs: No results for input(s): PROCALCITON, LATICACIDVEN in the last 168 hours.  Recent Results (from the past 240 hour(s))  Respiratory Panel by PCR     Status: Abnormal   Collection Time: 07/15/19 12:36 AM  Result Value Ref Range Status  Adenovirus NOT DETECTED NOT DETECTED Final   Coronavirus 229E NOT DETECTED NOT DETECTED Final    Comment: (NOTE) The Coronavirus on the Respiratory Panel, DOES NOT test for the novel  Coronavirus (2019 nCoV)    Coronavirus HKU1 NOT DETECTED NOT DETECTED Final   Coronavirus NL63 NOT DETECTED NOT DETECTED Final   Coronavirus OC43 NOT DETECTED NOT DETECTED Final   Metapneumovirus NOT DETECTED NOT DETECTED Final    Rhinovirus / Enterovirus DETECTED (A) NOT DETECTED Final   Influenza A NOT DETECTED NOT DETECTED Final   Influenza B NOT DETECTED NOT DETECTED Final   Parainfluenza Virus 1 NOT DETECTED NOT DETECTED Final   Parainfluenza Virus 2 NOT DETECTED NOT DETECTED Final   Parainfluenza Virus 3 NOT DETECTED NOT DETECTED Final   Parainfluenza Virus 4 NOT DETECTED NOT DETECTED Final   Respiratory Syncytial Virus NOT DETECTED NOT DETECTED Final   Bordetella pertussis NOT DETECTED NOT DETECTED Final   Chlamydophila pneumoniae NOT DETECTED NOT DETECTED Final   Mycoplasma pneumoniae NOT DETECTED NOT DETECTED Final    Comment: Performed at [facility], [place] [address] [place], [place] [address]    Radiology Studies: No results found.  Scheduled Meds: . amiodarone  200 mg Oral Daily  . budesonide (PULMICORT) nebulizer solution  0.5 mg Nebulization BID  . chlorhexidine  15 mL Mouth Rinse BID  . Chlorhexidine Gluconate Cloth  6 each Topical Daily  . clopidogrel  75 mg Oral Daily  . digoxin  0.125 mg Oral Daily  . feeding supplement (ENSURE ENLIVE)  237 mL Oral BID  . feeding supplement (PRO-STAT SUGAR FREE 64)  30 mL Oral TID BM  . furosemide  40 mg Oral Daily  . ipratropium  0.5 mg Inhalation TID  . lamoTRIgine  100 mg Oral BID  . levalbuterol  1.25 mg Nebulization TID  . levETIRAcetam  750 mg Oral BID  . mouth rinse  15 mL Mouth Rinse q12n4p  . midodrine  5 mg Oral TID WC  . mirtazapine  15 mg Oral QHS  . multivitamin with minerals  1 tablet Oral Daily  . pantoprazole (PROTONIX) IV  40 mg Intravenous Q24H  . rivaroxaban  20 mg Oral Q breakfast  . rosuvastatin  5 mg Oral QHS  . sertraline  25 mg Oral Daily  . sodium chloride flush  10-40 mL Intracatheter Q12H   Continuous Infusions: . sodium chloride Stopped (07/20/19 1644)     LOS: 13 days    Time spent: 35 mins.  Shawna Clamp, MD [facility]   If 7PM-7AM, please contact night-coverage

## 2019-07-21 NOTE — Progress Notes (Signed)
Progress Note   Subjective   Doing well today, the patient denies CP.  SOB is a little better.  Remains anemic.  No new concerns  Inpatient Medications    Scheduled Meds: . amiodarone  200 mg Oral Daily  . budesonide (PULMICORT) nebulizer solution  0.5 mg Nebulization BID  . chlorhexidine  15 mL Mouth Rinse BID  . Chlorhexidine Gluconate Cloth  6 each Topical Daily  . clopidogrel  75 mg Oral Daily  . digoxin  0.125 mg Oral Daily  . feeding supplement (ENSURE ENLIVE)  237 mL Oral BID  . feeding supplement (PRO-STAT SUGAR FREE 64)  30 mL Oral TID BM  . furosemide  40 mg Oral Daily  . ipratropium  0.5 mg Inhalation TID  . lamoTRIgine  100 mg Oral BID  . levalbuterol  1.25 mg Nebulization TID  . levETIRAcetam  750 mg Oral BID  . mouth rinse  15 mL Mouth Rinse q12n4p  . midodrine  5 mg Oral TID WC  . mirtazapine  15 mg Oral QHS  . multivitamin with minerals  1 tablet Oral Daily  . pantoprazole (PROTONIX) IV  40 mg Intravenous Q24H  . rivaroxaban  20 mg Oral Q breakfast  . rosuvastatin  5 mg Oral QHS  . sertraline  25 mg Oral Daily  . sodium chloride flush  10-40 mL Intracatheter Q12H   Continuous Infusions: . sodium chloride Stopped (07/20/19 1644)   PRN Meds: sodium chloride, docusate sodium, lip balm, loperamide, polyethylene glycol, sodium chloride flush   Vital Signs    Vitals:   07/21/19 0742 07/21/19 1011 07/21/19 1053 07/21/19 1200  BP:  (!) 121/104 (!) 93/59 97/62  Pulse: 72 78 81 98  Resp: 16 18 16    Temp:  98.1 F (36.7 C)    TempSrc:  Oral    SpO2: 100% 99% 99% 98%  Weight:      Height:        Intake/Output Summary (Last 24 hours) at 07/21/2019 1254 Last data filed at 07/21/2019 1131 Gross per 24 hour  Intake 1047.36 ml  Output 400 ml  Net 647.36 ml   Filed Weights   07/18/19 0400 07/20/19 1600 07/21/19 0248  Weight: 78 kg 75.5 kg 76.6 kg    Physical Exam   GEN- The patient is chronically ill appearing, alert and oriented x 3 today.   Head-  normocephalic, atraumatic Eyes-  Sclera clear, conjunctiva pink Ears- hearing intact Oropharynx- clear Neck- supple, Lungs-  normal work of breathing Heart- irregular rate and rhythm  GI- soft  Extremities- no clubbing, cyanosis, + dependant edema  MS- diffuse atrophy Skin- no rash or lesion Psych- euthymic mood, full affect Neuro- strength and sensation are intact   Labs    Chemistry Recent Labs  Lab 07/16/19 0629 07/16/19 1738 07/17/19 0428 07/17/19 0428 07/18/19 1002 07/18/19 1002 07/19/19 0424 07/20/19 0515 07/21/19 0356  NA 139   < > 137   < > 138   < > 140 138 134*  K 2.5*   < > 5.2*   < > 3.3*   < > 2.8* 3.5 4.0  CL 98   < > 97*   < > 94*   < > 94* 93* 92*  CO2 33*   < > 29   < > 34*   < > 35* 36* 33*  GLUCOSE 82   < > 136*   < > 116*   < > 83 82 77  BUN 25*   < >  24*   < > 22   < > 17 19 18   CREATININE 0.92   < > 0.81   < > 0.80   < > 0.74 0.75 0.81  CALCIUM 7.2*   < > 7.1*   < > 7.6*   < > 7.3* 7.9* 7.5*  PROT 4.3*  --  4.3*  --  4.8*  --   --   --   --   ALBUMIN 2.0*  --  2.0*  --  2.4*  --   --   --   --   AST 26  --  23  --  30  --   --   --   --   ALT 29  --  28  --  33  --   --   --   --   ALKPHOS 70  --  69  --  80  --   --   --   --   BILITOT 0.5  --  0.7  --  0.5  --   --   --   --   GFRNONAA >60   < > >60   < > >60   < > >60 >60 >60  GFRAA >60   < > >60   < > >60   < > >60 >60 >60  ANIONGAP 8   < > 11   < > 10   < > 11 9 9    < > = values in this interval not displayed.     Hematology Recent Labs  Lab 07/19/19 0424 07/20/19 0515 07/21/19 0356  WBC 7.4 5.5 5.0  RBC 3.05* 2.93* 2.58*  HGB 8.5* 8.3* 7.3*  HCT 27.7* 26.9* 23.7*  MCV 90.8 91.8 91.9  MCH 27.9 28.3 28.3  MCHC 30.7 30.9 30.8  RDW 20.9* 21.1* 21.2*  PLT 70* 94* 97*     Patient ID  64 y.o.malewithCAD(remote PCI to RCA 2006, inferior STEMI 3/2021s/p recent PCI with urgent DESx2 to RCA followed by staged PCI to Cx and LAD, persistent atrial fib/flutter s/p multiple DCCV  (last 05/23/41), chronic systolic and diastolic heart failure (LVEF 30-35%)with ischemic mitral regurgitation (severe), seizures,and recently diagnosed diffuse large cell lymphoma s/p 2 cycles of R-CHOP. He was recently admitted 5/28-6/12 with pancytopenia with recurrent atrial fib/flutter, a/c CHF, colonic ileus, oropharyngeal canddiasis, and hypotension requiring midodrine. He was readmitted 07/08/19 with weakness,fever,pancytopenia/neutropenia, heme positive stools,acute on chronic respiratory failure with hypoxemia,and acute on chronic heart failure.Found to have acute rhinovirus pneumonia and volume overload requiring diuresis. Also required initiation of milrinone. Other issues as outlined in notes with electrolyte abnormalities, pressure ulcer, severe protein calorie malnutrition with hypoalbuminemia.  Assessment & Plan    1.  Acute on chronic combined systolic dysfunction with severe MR Slowly improving Now off of milrinone Continue medicine optimization as bp allows  2. Persistent afib/ atypical atrial flutter Remains on amiodarone for rate control Rate control is otherwise limited by low BP Caution with xarelto with low platelets.  Anemia is worse today. No plans currently for rhythm control in the setting of worsening anemia, low platelets, prior failed cardioversion, and severe MR.  3. CAD No ischemic symptoms He is on plavix,  Will need to be careful with low platelets.  Anemia is worse today.  4. HL Continue crestor  5. Acute on chronic respiratory failure with rhinovirus pneumonia, pancytopenia/ diffuse large B cell lymphoma with extensive disease Prognosis is poor Too debilitated for chemo currently  6. Hypotension  Multifactorial (anemia, low albumen, medications, medical illness) Limits therapy On midodrine  Prognosis is poor Ultimately, palliative options may be best  Thompson Grayer MD, [facility] 07/21/2019 12:54 PM

## 2019-07-22 ENCOUNTER — Inpatient Hospital Stay (HOSPITAL_COMMUNITY): Payer: No Typology Code available for payment source

## 2019-07-22 LAB — COMPREHENSIVE METABOLIC PANEL
ALT: 23 U/L (ref 0–44)
AST: 22 U/L (ref 15–41)
Albumin: 2 g/dL — ABNORMAL LOW (ref 3.5–5.0)
Alkaline Phosphatase: 63 U/L (ref 38–126)
Anion gap: 11 (ref 5–15)
BUN: 18 mg/dL (ref 8–23)
CO2: 33 mmol/L — ABNORMAL HIGH (ref 22–32)
Calcium: 7.4 mg/dL — ABNORMAL LOW (ref 8.9–10.3)
Chloride: 92 mmol/L — ABNORMAL LOW (ref 98–111)
Creatinine, Ser: 0.89 mg/dL (ref 0.61–1.24)
GFR calc Af Amer: >60 mL/min (ref >60)
GFR calc non Af Amer: >60 mL/min (ref >60)
Glucose, Bld: 80 mg/dL (ref 70–99)
Potassium: 4 mmol/L (ref 3.5–5.1)
Sodium: 136 mmol/L (ref 135–145)
Total Bilirubin: 0.6 mg/dL (ref 0.3–1.2)
Total Protein: 4.2 g/dL — ABNORMAL LOW (ref 6.5–8.1)

## 2019-07-22 LAB — CBC
HCT: 22.5 % — ABNORMAL LOW (ref 39.0–52.0)
HCT: 23.2 % — ABNORMAL LOW (ref 39.0–52.0)
Hemoglobin: 7 g/dL — ABNORMAL LOW (ref 13.0–17.0)
Hemoglobin: 7.3 g/dL — ABNORMAL LOW (ref 13.0–17.0)
MCH: 28.5 pg (ref 26.0–34.0)
MCH: 29.1 pg (ref 26.0–34.0)
MCHC: 31.1 g/dL (ref 30.0–36.0)
MCHC: 31.5 g/dL (ref 30.0–36.0)
MCV: 91.5 fL (ref 80.0–100.0)
MCV: 92.4 fL (ref 80.0–100.0)
Platelets: 110 10*3/uL — ABNORMAL LOW (ref 150–400)
Platelets: 107 10*3/uL — ABNORMAL LOW (ref 150–400)
RBC: 2.46 MIL/uL — ABNORMAL LOW (ref 4.22–5.81)
RBC: 2.51 MIL/uL — ABNORMAL LOW (ref 4.22–5.81)
RDW: 20.8 % — ABNORMAL HIGH (ref 11.5–15.5)
RDW: 20.8 % — ABNORMAL HIGH (ref 11.5–15.5)
WBC: 5.1 10*3/uL (ref 4.0–10.5)
WBC: 4.6 10*3/uL (ref 4.0–10.5)
nRBC: 0 % (ref 0.0–0.2)
nRBC: 0 % (ref 0.0–0.2)

## 2019-07-22 LAB — MRSA PCR SCREENING: MRSA by PCR: NEGATIVE

## 2019-07-22 LAB — PREPARE RBC (CROSSMATCH)

## 2019-07-22 LAB — MAGNESIUM: Magnesium: 1.8 mg/dL (ref 1.7–2.4)

## 2019-07-22 LAB — PHOSPHORUS: Phosphorus: 2.4 mg/dL — ABNORMAL LOW (ref 2.5–4.6)

## 2019-07-22 MED ORDER — OXYMETAZOLINE HCL 0.05 % NA SOLN
2.0000 | Freq: Two times a day (BID) | NASAL | Status: DC
  Administered 2019-07-22 – 2019-07-25 (×6): 2 via NASAL
  Filled 2019-07-22: qty 15

## 2019-07-22 MED ORDER — FUROSEMIDE 10 MG/ML IJ SOLN
40.0000 mg | Freq: Once | INTRAMUSCULAR | Status: AC
  Administered 2019-07-22: 40 mg via INTRAVENOUS
  Filled 2019-07-22: qty 4

## 2019-07-22 MED ORDER — K PHOS MONO-SOD PHOS DI & MONO 155-852-130 MG PO TABS
250.0000 mg | ORAL_TABLET | Freq: Two times a day (BID) | ORAL | Status: AC
  Administered 2019-07-22 (×2): 250 mg via ORAL
  Filled 2019-07-22 (×2): qty 1

## 2019-07-22 MED ORDER — SODIUM CHLORIDE 0.9% IV SOLUTION: Freq: Once | INTRAVENOUS | Status: DC

## 2019-07-22 MED ORDER — SODIUM CHLORIDE 0.9 % IV SOLN
2.0000 g | Freq: Three times a day (TID) | INTRAVENOUS | Status: DC
  Administered 2019-07-22 – 2019-07-25 (×10): 2 g via INTRAVENOUS
  Filled 2019-07-22 (×10): qty 2

## 2019-07-22 NOTE — Progress Notes (Signed)
Patient noted to be having frequent nose bleeds. Dr. Dwyane Dee notified

## 2019-07-22 NOTE — Progress Notes (Signed)
[facility] P[facility]  S: Called to bedside for nosebleed, coughing up blood clots.  "I'm feeling better now"; denies breathing difficulty, says over the last few days his chest congestion has improved significantly.  Past Medical History:  Diagnosis Date  . 3vessel CAD- S/P PCI 03/27/2019   Remote pRCA PCI-stenting 2006. Acute MI 03/27/2019 treated with urgent m-d RCA PCI and stent (x 2) followed by staged PCI DES to CFX (x2 stents) and LAD (x1) on 03/29/2019  . Acute ST elevation myocardial infarction (STEMI) of inferior wall ([place]) 03/27/2019   Pt presented 03/27/2019 with an acute inferior MI- Cath 03/27/19 showed thrombotic occlusion of mid-distal RCA with a patent previously placed pRCA stent -2 overlapping Synergy DES from PDA back into proximal RCA stented segment and PTCA of RPAV (jailed)-PL 3 He also had concomitant high garde CFX and LAD disease (staged PCI) with an EF of 30-35%  . Chronic combined systolic and diastolic CHF, NYHA class 2 and ACC/AHA stage C ([place]) 03/27/2019   EF 30 to 40% with diffuse inferior hypokinesis/akinesis; severe ischemic MR  . 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 Myocardial infarction [facility]) 2006   PCI of the RCA  . PAF (persistent-paroxysmal atrial fibrillation) ([place]) 04/16/2019   Brief- post MI  . Seizure, temporal lobe ([place]) 2017   most recent 07/23/15-on Keppra and Lamictal  . Severe mitral regurgitation by prior echocardiogram-likely ischemic with tethered posterior leaflet 03/31/2019   Repeat echo 4-6 weeks post MI   Gen: Denies fever, chills, weight change, fatigue, night sweats HEENT: Denies blurred vision, double vision, hearing loss, tinnitus, sinus congestion, rhinorrhea, sore throat, neck stiffness, dysphagia, + epistaxis as noted above PULM: Denies shortness of breath, cough, sputum production, hemoptysis, wheezing CV: Denies chest pain, edema, orthopnea, paroxysmal nocturnal dyspnea, palpitations   O:  Vitals:    07/22/19 0511 07/22/19 0831 07/22/19 0921 07/22/19 1407  BP: 100/66  102/61 (!) 96/55  Pulse: 100  100 97  Resp: 20   17  Temp: 98.3 F (36.8 C)   97.8 F (36.6 C)  TempSrc:    Oral  SpO2: 96% 97% 97% 91%  Weight:      Height:       O2 saturation 94% on RA on my exam  General:  Chronically appearing, resting comfortably in bed HENT: NCAT OP clear, temporal wasting noted PULM: Few crackles bases but otherwise normal CV: RRR, no mgr GI: BS+, soft, nontender MSK: normal bulk and tone Derm: dependent edema in elbows, hips, notable bruising extensor surfaces Neuro: awake, alert, no distress, MAEW     CBC    Component Value Date/Time   WBC 4.6 07/22/2019 1213   RBC 2.51 (L) 07/22/2019 1213   HGB 7.3 (L) 07/22/2019 1213   HGB 6.9 (LL) 06/15/2019 1400   HGB 11.6 (L) 04/16/2019 1215   HCT 23.2 (L) 07/22/2019 1213   HCT 35.5 (L) 04/16/2019 1215   PLT 107 (L) 07/22/2019 1213   PLT 44 (L) 06/15/2019 1400   PLT 311 04/16/2019 1215   MCV 92.4 07/22/2019 1213   MCV 82 04/16/2019 1215   MCH 29.1 07/22/2019 1213   MCHC 31.5 07/22/2019 1213   RDW 20.8 (H) 07/22/2019 1213   RDW 14.7 04/16/2019 1215   LYMPHSABS 0.1 (L) 07/17/2019 0428   LYMPHSABS 1.5 12/26/2015 1037   MONOABS 0.3 07/17/2019 0428   EOSABS 0.0 07/17/2019 0428   EOSABS 0.1 12/26/2015 1037   BASOSABS 0.0 07/17/2019 0428  BASOSABS 0.0 12/26/2015 1037   Impression: Left nare epistaxis> resolved Chronic Systolic heart failure Rhinovirus pneumonia> significantly improved Diffuse b-cell lymphoma Anemia and thrombocytopenia due to hematologic malignancy, critical illness, chemotherapy  Plan: Keep nasal packing at bedside in case of epistaxis recurrence> would pack left nare if happens again Afrin nose spray now> 2 sprays bid but can use higher doses if epistaxis recurs Hold plavix Hold Xarelto Will order lasix to go along with unit of blood transfusion CXR now  I updated his wife bedside  Roselie Awkward,  MD [place] [facility] Pager: [phone] Cell: [phone] If no response, call [phone]

## 2019-07-22 NOTE — Plan of Care (Signed)
Page from nurse that patient is coughing up large clots of blood, Patient was seen,  family was at bedside.   Advised nurse to hold Xarelto and Plavix, check H&H, will transfuse one PRBC, PCCM consulted,  will follow up recommendation.

## 2019-07-22 NOTE — Progress Notes (Signed)
PT Cancellation Note  Patient Details Name: Tony Saunders MRN: [id number] DOB: 1955-07-10   Cancelled Treatment:     PT deferred this date.  PT met at door by pt's spouse who reported pt had just coughed up large blood clots - RN alerted and to room to assess.  Will follow.  [place] Pager [phone] Office [phone]    Peggye Poon 07/22/2019, 3:15 PM

## 2019-07-22 NOTE — Progress Notes (Signed)
Progress Note   Subjective   Motivated with PT.  Making progress.  No new concerns  Inpatient Medications    Scheduled Meds: . amiodarone  200 mg Oral Daily  . budesonide (PULMICORT) nebulizer solution  0.5 mg Nebulization BID  . chlorhexidine  15 mL Mouth Rinse BID  . Chlorhexidine Gluconate Cloth  6 each Topical Daily  . clopidogrel  75 mg Oral Daily  . digoxin  0.125 mg Oral Daily  . feeding supplement (ENSURE ENLIVE)  237 mL Oral BID  . feeding supplement (PRO-STAT SUGAR FREE 64)  30 mL Oral TID BM  . furosemide  40 mg Oral Daily  . ipratropium  0.5 mg Inhalation TID  . lamoTRIgine  100 mg Oral BID  . levalbuterol  1.25 mg Nebulization TID  . levETIRAcetam  750 mg Oral BID  . mouth rinse  15 mL Mouth Rinse q12n4p  . midodrine  5 mg Oral TID WC  . mirtazapine  15 mg Oral QHS  . multivitamin with minerals  1 tablet Oral Daily  . pantoprazole (PROTONIX) IV  40 mg Intravenous Q24H  . rivaroxaban  20 mg Oral Q breakfast  . rosuvastatin  5 mg Oral QHS  . sertraline  25 mg Oral Daily  . sodium chloride flush  10-40 mL Intracatheter Q12H   Continuous Infusions: . sodium chloride Stopped (07/20/19 1644)   PRN Meds: sodium chloride, docusate sodium, lip balm, loperamide, polyethylene glycol, sodium chloride flush   Vital Signs    Vitals:   07/21/19 2002 07/21/19 2102 07/22/19 0500 07/22/19 0511  BP:  93/61  100/66  Pulse:  100  100  Resp:  18  20  Temp:  98.5 F (36.9 C)  98.3 F (36.8 C)  TempSrc:      SpO2: 97% 96%  96%  Weight:   79.6 kg   Height:        Intake/Output Summary (Last 24 hours) at 07/22/2019 0802 Last data filed at 07/22/2019 0500 Gross per 24 hour  Intake 1200 ml  Output 1400 ml  Net -200 ml   Filed Weights   07/20/19 1600 07/21/19 0248 07/22/19 0500  Weight: 75.5 kg 76.6 kg 79.6 kg    Telemetry    Sinus with frequent atrial ectopy today - Personally Reviewed  Physical Exam   GEN- The patient is ill appearing, sleeping this  morning Head- + bitemporal wasting Eyes-  Sclera clear, conjunctiva pink Ears- hearing intact Oropharynx- clear Neck- supple, Lungs-  normal work of breathing Heart- Regular rate and rhythm  GI- soft  Extremities- no clubbing, cyanosis, + dependant edema  MS- + atrophy Skin- no rash or lesion Psych- euthymic mood, full affect Neuro- strength and sensation are intact   Labs    Chemistry Recent Labs  Lab 07/17/19 0428 07/17/19 0428 07/18/19 1002 07/19/19 0424 07/20/19 0515 07/21/19 0356 07/22/19 0500  NA 137   < > 138   < > 138 134* 136  K 5.2*   < > 3.3*   < > 3.5 4.0 4.0  CL 97*   < > 94*   < > 93* 92* 92*  CO2 29   < > 34*   < > 36* 33* 33*  GLUCOSE 136*   < > 116*   < > 82 77 80  BUN 24*   < > 22   < > 19 18 18   CREATININE 0.81   < > 0.80   < > 0.75 0.81 0.89  CALCIUM  7.1*   < > 7.6*   < > 7.9* 7.5* 7.4*  PROT 4.3*  --  4.8*  --   --   --  4.2*  ALBUMIN 2.0*  --  2.4*  --   --   --  2.0*  AST 23  --  30  --   --   --  22  ALT 28  --  33  --   --   --  23  ALKPHOS 69  --  80  --   --   --  63  BILITOT 0.7  --  0.5  --   --   --  0.6  GFRNONAA >60   < > >60   < > >60 >60 >60  GFRAA >60   < > >60   < > >60 >60 >60  ANIONGAP 11   < > 10   < > 9 9 11    < > = values in this interval not displayed.     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 He was recently admitted 5/28-6/12 with pancytopenia with recurrent atrial fib/flutter, a/c CHF, colonic ileus, oropharyngeal canddiasis, and hypotension requiring midodrine. He was readmitted 07/08/19 with weakness,fever,pancytopenia/neutropenia, heme positive stools,acute on chronic respiratory failure with hypoxemia,and acute on chronic heart failure.Found to have acute rhinovirus pneumonia and volume overload requiring diuresis. Also required initiation of milrinone. He also has pressure ulcer, severe protein calorie malnutrition with hypoalbuminemia.  Assessment & Plan    1.  Acute on chronic combined systolic and diastolic dysfunction with severe MR Improving clinically Off of milrinone and on oral lasix now Additional medical therapy is limited by hypotension  2. Worsening anemia Occurs int he setting of medical illness, thrombocytopenia an plavix/ xarelto. May require prbcs soon We may eventually have to stop xarelto.  Hopefully we can continue for now.  I would like to keep him on plavix given recent MI/ PCI  3. CAD No ischemic symptoms Continue plavix as able  4. Hypotension multifactorial (anemia, low albumen, medicines, medical illness) Limits CHF therapy Continue midodrine  5. Diffuse large cell lymphoma Chemotherapy is current on hold due to illness  6. Persistent afib/ atypical atrial flutter Failed cardioversion On amiodarone for rate control Currently in sinus with frequent ectopy Continue xarelto for now, though given low platelets and worsening anemia, we may have to stop at some point. chads2vasc score is 2  7. Deconditioning Working with PT Likely to require SNF at discharge  Overall prognosis remains very poor Cardiology to follow  Thompson Grayer MD, [facility] 07/22/2019 8:02 AM

## 2019-07-22 NOTE — Progress Notes (Signed)
PROGRESS NOTE    Tony Saunders  DUK:[id number] DOB: 01/26/1955    DOA: 07/08/2019   PCP: [name], [name], MD   Brief Narrative:  [name] is a 64 y/o male admitted on 6/20 with three weeks of diarrhea and weakness, noted on admission to have pancytopenia and fever.  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 He was admitted in ICU, subsequently ended up going to heart failure and had to be placed on a milrinone drip. Milrinone drip has been titrated off and is relatively stable, however he did test positive for rhinovirus which likely will maintain his cardiac issues. He became agitated requiring Precedex drip briefly and Levophed for hypotension.  He is off Levophed now. He is extremely deconditioned and needs to remain in the hospital for strengthening and for the PT OT evaluation. Need to touch base with oncology when to start his chemotherapy as a mentioned in second week of July.  Assessment & Plan:   Active Problems:   Acute on chronic systolic heart failure (HCC)   Pressure ulcer   Cardiogenic shock (HCC)   Acute respiratory failure with hypoxia (HCC)   Severe sepsis (HCC)   Neutropenic sepsis (HCC)   Persistent atrial fibrillation (HCC)  Acute on chronic respiratory failure multifactorial:   Causes rhinovirus pneumonia /Decompensated heart failure , Possible obstructive lung disease at baseline - Continue Pulmicort, Xopenex, Atrovent -Out of bed to chair as tolerated.  Acute decompensated systolic heart failure Severe mitral regurg Echo:  LVEF 35-40%  global hypokinesis, severe MR, large pleural effusion -Continue Plavix. -lasix switched to oral -Continue midodrine -off milrinone infusion.  Hypokalemia, hypophosphatemia -Continue repletion.   Atrial fibrillation Continue Tele Xarelto, digoxin, amiodarone. Cardiology following  Pancytopenia in setting of  chemotherapy use for lymphoma> improving counts No evidence of brisk bleeding but fecal occult blood test positive on stool card on admission Transfuse PRBC for Hgb < 7 gm/dL Outpatient GI work up.  Diffuse large cell lymphoma Hold immunosuppressive cancer treatment given frailty Oncology continues to follow -Plan is to possibly continue chemotherapy second week of July  Pressure ulcer present on admission Wound care.  Seizure disorder Continue home lamictal, keppra  Severe protein calorie malnutrition Continue regular diet + ensure/boost/prostat/MVI   DVT prophylaxis: Xarelto Code Status: Full code Family Communication: Discussed with patient and the daughter at bedside Disposition Plan: Anticipated discharge to skilled nursing facility.  Dispo: The patient is from:Home Anticipated d/c is to:SNF or Home with home PT Anticipated d/c date is:1-2 days Patient currently is not medically stable to d/c.   Consultants:   PCCM, oncology, cardiology  Procedures:  Antimicrobials: Anti-infectives (From admission, onward)   Start     Dose/Rate Route Frequency Ordered Stop   07/13/19 2200  vancomycin (VANCOREADY) IVPB 750 mg/150 mL  Status:  Discontinued        750 mg 150 mL/hr over 60 Minutes Intravenous Every 12 hours 07/13/19 1328 07/13/19 1609   07/13/19 2200  vancomycin (VANCOCIN) IVPB 1000 mg/200 mL premix  Status:  Discontinued        1,000 mg 200 mL/hr over 60 Minutes Intravenous Every 12 hours 07/13/19 1609 07/15/19 1053   07/13/19 1200  metroNIDAZOLE (FLAGYL) IVPB 500 mg  Status:  Discontinued        500 mg 100 mL/hr over 60 Minutes Intravenous Every 8 hours 07/13/19 0851 07/15/19 1053   07/13/19 1000  vancomycin (VANCOREADY) IVPB 750 mg/150 mL  Status:  Discontinued        750 mg 150 mL/hr over 60 Minutes Intravenous Every 8 hours 07/13/19 0851 07/13/19 1328   07/13/19 0900  ceFEPIme (MAXIPIME) 2 g in sodium chloride 0.9  % 100 mL IVPB  Status:  Discontinued        2 g 200 mL/hr over 30 Minutes Intravenous Every 8 hours 07/13/19 0849 07/16/19 1116   07/09/19 0900  ceFEPIme (MAXIPIME) 2 g in sodium chloride 0.9 % 100 mL IVPB  Status:  Discontinued        2 g 200 mL/hr over 30 Minutes Intravenous Every 8 hours 07/09/19 0846 07/12/19 1150   07/09/19 0600  metroNIDAZOLE (FLAGYL) IVPB 500 mg  Status:  Discontinued        500 mg 100 mL/hr over 60 Minutes Intravenous Every 8 hours 07/08/19 1819 07/12/19 1044   07/09/19 0200  vancomycin (VANCOREADY) IVPB 750 mg/150 mL  Status:  Discontinued        750 mg 150 mL/hr over 60 Minutes Intravenous Every 8 hours 07/08/19 1834 07/12/19 1150   07/08/19 2200  ceFEPIme (MAXIPIME) 2 g in sodium chloride 0.9 % 100 mL IVPB  Status:  Discontinued        2 g 200 mL/hr over 30 Minutes Intravenous Every 8 hours 07/08/19 1825 07/09/19 0846   07/08/19 1415  vancomycin (VANCOREADY) IVPB 1750 mg/350 mL        1,750 mg 175 mL/hr over 120 Minutes Intravenous  Once 07/08/19 1400 07/08/19 1825   07/08/19 1400  ceFEPIme (MAXIPIME) 2 g in sodium chloride 0.9 % 100 mL IVPB        2 g 200 mL/hr over 30 Minutes Intravenous  Once 07/08/19 1352 07/08/19 1542   07/08/19 1400  metroNIDAZOLE (FLAGYL) IVPB 500 mg        500 mg 100 mL/hr over 60 Minutes Intravenous  Once 07/08/19 1352 07/08/19 1954   07/08/19 1400  vancomycin (VANCOCIN) IVPB 1000 mg/200 mL premix  Status:  Discontinued        1,000 mg 200 mL/hr over 60 Minutes Intravenous  Once 07/08/19 1352 07/08/19 1400      Subjective: Patient was seen and examined at bedside.  No overnight events.  He reports feeling much better and wants to be discharged. He denies any pain, dizziness palpitations.   Objective: Vitals:   07/22/19 0500 07/22/19 0511 07/22/19 0831 07/22/19 0921  BP:  100/66  102/61  Pulse:  100  100  Resp:  20    Temp:  98.3 F (36.8 C)    TempSrc:      SpO2:  96% 97% 97%  Weight: 79.6 kg     Height:         Intake/Output Summary (Last 24 hours) at 07/22/2019 1336 Last data filed at 07/22/2019 1100 Gross per 24 hour  Intake 720 ml  Output 1200 ml  Net -480 ml   Filed Weights   07/20/19 1600 07/21/19 0248 07/22/19 0500  Weight: 75.5 kg 76.6 kg 79.6 kg    Examination:  General exam: Appears calm and comfortable  Respiratory system: Clear to auscultation. Respiratory effort normal. Cardiovascular system: S1 & S2 heard, RRR. No JVD, murmurs, rubs, gallops or clicks. No pedal edema. Gastrointestinal system: Abdomen is nondistended, soft and nontender. No organomegaly or masses felt. Normal bowel sounds heard. Central nervous system: Alert and oriented. No focal neurological deficits. Extremities: No edema, no swelling , no cyanosis. Skin: No rashes, lesions or ulcers Psychiatry: Judgement  and insight appear normal. Mood & affect appropriate.     Data Reviewed: I have personally reviewed following labs and imaging studies  CBC: Recent Labs  Lab 07/17/19 0428 07/18/19 1002 07/19/19 0424 07/20/19 0515 07/21/19 0356 07/22/19 0500 07/22/19 1213  WBC 7.0   < > 7.4 5.5 5.0 5.1 4.6  NEUTROABS 6.2  --   --   --   --   --   --   HGB 7.8*   < > 8.5* 8.3* 7.3* 7.0* 7.3*  HCT 25.2*   < > 27.7* 26.9* 23.7* 22.5* 23.2*  MCV 91.3   < > 90.8 91.8 91.9 91.5 92.4  PLT 66*   < > 70* 94* 97* 110* 107*   < > = values in this interval not displayed.   Basic Metabolic Panel: Recent Labs  Lab 07/16/19 0629 07/16/19 1738 07/17/19 0428 07/17/19 0428 07/17/19 2045 07/18/19 0407 07/18/19 1002 07/19/19 0424 07/20/19 0515 07/21/19 0356 07/22/19 0500  NA 139   < > 137   < >  --   --  138 140 138 134* 136  K 2.5*   < > 5.2*   < >  --   --  3.3* 2.8* 3.5 4.0 4.0  CL 98   < > 97*   < >  --   --  94* 94* 93* 92* 92*  CO2 33*   < > 29   < >  --   --  34* 35* 36* 33* 33*  GLUCOSE 82   < > 136*   < >  --   --  116* 83 82 77 80  BUN 25*   < > 24*   < >  --   --  22 17 19 18 18   CREATININE 0.92   <  > 0.81   < >  --   --  0.80 0.74 0.75 0.81 0.89  CALCIUM 7.2*   < > 7.1*   < >  --   --  7.6* 7.3* 7.9* 7.5* 7.4*  MG 2.1  --   --   --   --   --   --   --  1.9 1.9 1.8  PHOS 2.0*  --  1.3*  --  2.9 2.7  --   --   --   --  2.4*   < > = values in this interval not displayed.   GFR: Estimated Creatinine Clearance: 94.5 mL/min (by C-G formula based on SCr of 0.89 mg/dL). Liver Function Tests: Recent Labs  Lab 07/16/19 0629 07/17/19 0428 07/18/19 1002 07/22/19 0500  AST 26 23 30 22   ALT 29 28 33 23  ALKPHOS 70 69 80 63  BILITOT 0.5 0.7 0.5 0.6  PROT 4.3* 4.3* 4.8* 4.2*  ALBUMIN 2.0* 2.0* 2.4* 2.0*   No results for input(s): LIPASE, AMYLASE in the last 168 hours. No results for input(s): AMMONIA in the last 168 hours. Coagulation Profile: Recent Labs  Lab 07/16/19 0629  INR 1.3*   Cardiac Enzymes: No results for input(s): CKTOTAL, CKMB, CKMBINDEX, TROPONINI in the last 168 hours. BNP (last 3 results) No results for input(s): PROBNP in the last 8760 hours. HbA1C: No results for input(s): HGBA1C in the last 72 hours. CBG: No results for input(s): GLUCAP in the last 168 hours. Lipid Profile: No results for input(s): CHOL, HDL, LDLCALC, TRIG, CHOLHDL, LDLDIRECT in the last 72 hours. Thyroid Function Tests: No results for input(s): TSH, T4TOTAL, FREET4, T3FREE, THYROIDAB in the  last 72 hours. Anemia Panel: No results for input(s): VITAMINB12, FOLATE, FERRITIN, TIBC, IRON, RETICCTPCT in the last 72 hours. Sepsis Labs: No results for input(s): PROCALCITON, LATICACIDVEN in the last 168 hours.  Recent Results (from the past 240 hour(s))  Respiratory Panel by PCR     Status: Abnormal   Collection Time: 07/15/19 12:36 AM  Result Value Ref Range Status   Adenovirus NOT DETECTED NOT DETECTED Final   Coronavirus 229E NOT DETECTED NOT DETECTED Final    Comment: (NOTE) The Coronavirus on the Respiratory Panel, DOES NOT test for the novel  Coronavirus (2019 nCoV)    Coronavirus  HKU1 NOT DETECTED NOT DETECTED Final   Coronavirus NL63 NOT DETECTED NOT DETECTED Final   Coronavirus OC43 NOT DETECTED NOT DETECTED Final   Metapneumovirus NOT DETECTED NOT DETECTED Final   Rhinovirus / Enterovirus DETECTED (A) NOT DETECTED Final   Influenza A NOT DETECTED NOT DETECTED Final   Influenza B NOT DETECTED NOT DETECTED Final   Parainfluenza Virus 1 NOT DETECTED NOT DETECTED Final   Parainfluenza Virus 2 NOT DETECTED NOT DETECTED Final   Parainfluenza Virus 3 NOT DETECTED NOT DETECTED Final   Parainfluenza Virus 4 NOT DETECTED NOT DETECTED Final   Respiratory Syncytial Virus NOT DETECTED NOT DETECTED Final   Bordetella pertussis NOT DETECTED NOT DETECTED Final   Chlamydophila pneumoniae NOT DETECTED NOT DETECTED Final   Mycoplasma pneumoniae NOT DETECTED NOT DETECTED Final    Comment: Performed at [facility], [place]. [address], [place], [place] [address]    Radiology Studies: No results found.  Scheduled Meds: . amiodarone  200 mg Oral Daily  . budesonide (PULMICORT) nebulizer solution  0.5 mg Nebulization BID  . chlorhexidine  15 mL Mouth Rinse BID  . Chlorhexidine Gluconate Cloth  6 each Topical Daily  . clopidogrel  75 mg Oral Daily  . digoxin  0.125 mg Oral Daily  . feeding supplement (ENSURE ENLIVE)  237 mL Oral BID  . feeding supplement (PRO-STAT SUGAR FREE 64)  30 mL Oral TID BM  . furosemide  40 mg Oral Daily  . ipratropium  0.5 mg Inhalation TID  . lamoTRIgine  100 mg Oral BID  . levalbuterol  1.25 mg Nebulization TID  . levETIRAcetam  750 mg Oral BID  . mouth rinse  15 mL Mouth Rinse q12n4p  . midodrine  5 mg Oral TID WC  . mirtazapine  15 mg Oral QHS  . multivitamin with minerals  1 tablet Oral Daily  . pantoprazole (PROTONIX) IV  40 mg Intravenous Q24H  . phosphorus  250 mg Oral BID  . rivaroxaban  20 mg Oral Q breakfast  . rosuvastatin  5 mg Oral QHS  . sertraline  25 mg Oral Daily  . sodium chloride flush  10-40 mL Intracatheter Q12H    Continuous Infusions: . sodium chloride Stopped (07/20/19 1644)     LOS: 14 days    Time spent: 35 mins.  Shawna Clamp, MD [facility]   If 7PM-7AM, please contact night-coverage

## 2019-07-22 NOTE — Plan of Care (Signed)
Chest x-ray shows worsening consolidation, discussed with pharmacist,  we will start patient on cefepime and order MRSA PCR, if positive will add vancomycin. Patient denies any abdominal pain,  so we will hold on abdominal x-ray at this point.  RN was notified.

## 2019-07-23 LAB — TYPE AND SCREEN
ABO/RH(D): O POS
Antibody Screen: NEGATIVE
Unit division: 0

## 2019-07-23 LAB — COMPREHENSIVE METABOLIC PANEL
ALT: 21 U/L (ref 0–44)
AST: 24 U/L (ref 15–41)
Albumin: 2 g/dL — ABNORMAL LOW (ref 3.5–5.0)
Alkaline Phosphatase: 59 U/L (ref 38–126)
Anion gap: 7 (ref 5–15)
BUN: 19 mg/dL (ref 8–23)
CO2: 35 mmol/L — ABNORMAL HIGH (ref 22–32)
Calcium: 7.4 mg/dL — ABNORMAL LOW (ref 8.9–10.3)
Chloride: 91 mmol/L — ABNORMAL LOW (ref 98–111)
Creatinine, Ser: 0.76 mg/dL (ref 0.61–1.24)
GFR calc Af Amer: >60 mL/min (ref >60)
GFR calc non Af Amer: >60 mL/min (ref >60)
Glucose, Bld: 76 mg/dL (ref 70–99)
Potassium: 3.2 mmol/L — ABNORMAL LOW (ref 3.5–5.1)
Sodium: 133 mmol/L — ABNORMAL LOW (ref 135–145)
Total Bilirubin: 0.5 mg/dL (ref 0.3–1.2)
Total Protein: 4.2 g/dL — ABNORMAL LOW (ref 6.5–8.1)

## 2019-07-23 LAB — CBC
HCT: 25.1 % — ABNORMAL LOW (ref 39.0–52.0)
Hemoglobin: 8 g/dL — ABNORMAL LOW (ref 13.0–17.0)
MCH: 28.4 pg (ref 26.0–34.0)
MCHC: 31.9 g/dL (ref 30.0–36.0)
MCV: 89 fL (ref 80.0–100.0)
Platelets: 106 10*3/uL — ABNORMAL LOW (ref 150–400)
RBC: 2.82 MIL/uL — ABNORMAL LOW (ref 4.22–5.81)
RDW: 19.5 % — ABNORMAL HIGH (ref 11.5–15.5)
WBC: 4.3 10*3/uL (ref 4.0–10.5)
nRBC: 0 % (ref 0.0–0.2)

## 2019-07-23 LAB — BPAM RBC
Blood Product Expiration Date: 202107312359
ISSUE DATE / TIME: 202107041917
Unit Type and Rh: 5100

## 2019-07-23 LAB — PHOSPHORUS: Phosphorus: 2.8 mg/dL (ref 2.5–4.6)

## 2019-07-23 LAB — MAGNESIUM: Magnesium: 1.8 mg/dL (ref 1.7–2.4)

## 2019-07-23 MED ORDER — POTASSIUM CHLORIDE CRYS ER 20 MEQ PO TBCR
40.0000 meq | EXTENDED_RELEASE_TABLET | Freq: Once | ORAL | Status: AC
  Administered 2019-07-23: 40 meq via ORAL
  Filled 2019-07-23: qty 2

## 2019-07-23 MED ORDER — ALTEPLASE 2 MG IJ SOLR
2.0000 mg | Freq: Once | INTRAMUSCULAR | Status: AC
  Administered 2019-07-23: 2 mg
  Filled 2019-07-23: qty 2

## 2019-07-23 MED ORDER — CLOPIDOGREL BISULFATE 75 MG PO TABS
75.0000 mg | ORAL_TABLET | Freq: Every day | ORAL | Status: DC
  Administered 2019-07-23 – 2019-07-25 (×3): 75 mg via ORAL
  Filled 2019-07-23 (×3): qty 1

## 2019-07-23 MED ORDER — STERILE WATER FOR INJECTION IJ SOLN
INTRAMUSCULAR | Status: AC
  Filled 2019-07-23: qty 10

## 2019-07-23 NOTE — TOC Progression Note (Signed)
Transition of Care [facility]) - Progression Note    Patient Details  Name: Anthonymichael Munday MRN: [id number] Date of Birth: 1955/08/21  [facility] CM/SW Contact  Joaquin Courts, RN Phone Number: 07/23/2019, 12:12 PM  Clinical Narrative:    CM spoke with patient regarding PT recommendations for HHPT vs SNF.  Patient reports that he wishes to return home where he lives with his spouse and has family that can assist him.  Patient is open to [facility] services and believes his wife has been working to arrange this but is uncertain about the name of the agency.  CM called spouse to confirm this information and obtain the name of the [facility] agency but was unable to reach her by phone.  CM will continue to follow-up and assist with dc planning as needed.    Expected Discharge Plan: [place] Barriers to Discharge: Continued Medical Work up  Expected Discharge Plan and Services Expected Discharge Plan: [place]   Discharge Planning Services: CM Consult   Living arrangements for the past 2 months: Single Family Home                                       Social Determinants of Health (SDOH) Interventions    Readmission Risk Interventions Readmission Risk Prevention Plan 07/23/2019  Transportation Screening Complete  Medication Review [employer]) Complete  HRI or [place] Complete  SW Recovery Care/Counseling Consult Complete  Palliative Care Screening Not [place] Patient Refused  Some recent data might be hidden

## 2019-07-23 NOTE — Progress Notes (Signed)
Physical Therapy Treatment Patient Details Name: Tony Saunders MRN: [id number] DOB: 1955/04/17 Today's Date: 07/23/2019    History of Present Illness Pt to ED 2* weakness and admitted with acute hypoxemic respiratory failure, acute decompensated, systolic CHF and severe pancytopenia 2* chemo and increasing L LE edema 2* ongoing Lymphoma.  Pt with hx of MI, seizure disorder, CHF, PAF - pt underwent Cardioversion 06/22/19    PT Comments     Pt very limited by weakness and continues to require +2 Mod-Max assist for functional transfers. Pt limited to stand step transfer due to c/o dizziness with mobilizing and unsafe to advance gait distance this date. Pt's daughter and wife present this date and PT/OT discussed significant assist required with all three and plan for discharge. Family and pt wish to return home and have a wheelchair and RW at home. He will require a hospital bed if he returns home and would benefit from a [facility] aid for assist with mobility. Patient will benefit from follow up [facility] services at home if family can setup assistance through palliative care at oncology office. He will benefit from follow up OPPT services for strength/mobility training. Recommended to family/paitent he mobilize with +2 assist and use wheelchair to move around home rather than attempt ambulation until he progress with therapy services.    Follow Up Recommendations  Home health PT;Supervision/Assistance - 24 hour;Outpatient PT (pt will need HH aid)     Equipment Recommendations  Hospital bed    Recommendations for Other Services OT consult     Precautions / Restrictions Precautions Precautions: Fall Precaution Comments: very frail, painful left foot. knees subject to buckle.  DROPLET Restrictions Weight Bearing Restrictions: No    Mobility  Bed Mobility Overal bed mobility: Needs Assistance             General bed mobility comments: sitting EOB with OT at PT arrival  Transfers Overall  transfer level: Needs assistance Equipment used: Rolling walker (2 wheeled) Transfers: Sit to/from [facility] Sit to Stand: Mod assist;Max assist;+2 physical assistance;+2 safety/equipment Stand pivot transfers: Mod assist;+2 physical assistance;+2 safety/equipment       General transfer comment: cues for use of UE's/technique for power up with RW. Mod-Max assist +2 to complete power up/rise to RW. pt c/o dizziness in standing and returned to sitting EOB after ~ 1 minute. Pt performed 2nd sit to stand and BP stable 109/64, completed stand step transfer to recliner with Mod assist +2 to steady and manage walker, VC's for safe reach back to control lowering.   Ambulation/Gait        Stairs             Wheelchair Mobility    Modified Rankin (Stroke Patients Only)       Balance Overall balance assessment: Needs assistance;History of Falls Sitting-balance support: Feet supported;Bilateral upper extremity supported Sitting balance-Leahy Scale: Fair     Standing balance support: Bilateral upper extremity supported;During functional activity Standing balance-Leahy Scale: Poor Standing balance comment: reliant on external support               Cognition Arousal/Alertness: Awake/alert Behavior During Therapy: WFL for tasks assessed/performed Overall Cognitive Status: Within Functional Limits for tasks assessed                 Exercises      General Comments        Pertinent Vitals/Pain Pain Assessment: Faces Faces Pain Scale: Hurts a little bit Pain Location: general discomfort with mobilizing  Pain Descriptors / Indicators: Discomfort;Grimacing Pain Intervention(s): Limited activity within patient's tolerance;Monitored during session;Repositioned           PT Goals (current goals can now be found in the care plan section) Acute Rehab PT Goals Patient Stated Goal: to go home PT Goal Formulation: With patient/family Time For Goal  Achievement: 07/29/19 Potential to Achieve Goals: Fair Progress towards PT goals: Not progressing toward goals - comment (limited by weakness)    Frequency    Min 3X/week      PT Plan Discharge plan needs to be updated    Co-evaluation PT/OT/SLP Co-Evaluation/Treatment: Yes Reason for Co-Treatment: To address functional/ADL transfers;For patient/therapist safety PT goals addressed during session: Mobility/safety with mobility;Balance;Proper use of DME        AM-PAC PT "6 Clicks" Mobility   Outcome Measure  Help needed turning from your back to your side while in a flat bed without using bedrails?: A Little Help needed moving from lying on your back to sitting on the side of a flat bed without using bedrails?: A Little Help needed moving to and from a bed to a chair (including a wheelchair)?: Total Help needed standing up from a chair using your arms (e.g., wheelchair or bedside chair)?: Total Help needed to walk in hospital room?: Total Help needed climbing 3-5 steps with a railing? : Total 6 Click Score: 10    End of Session Equipment Utilized During Treatment: Gait belt;Oxygen Activity Tolerance: Patient tolerated treatment well (very weak) Patient left: in chair;with call bell/phone within reach;with family/visitor present Nurse Communication: Mobility status PT Visit Diagnosis: Difficulty in walking, not elsewhere classified (R26.2);Muscle weakness (generalized) (M62.81)     Time: 0109-3235 PT Time Calculation (min) (ACUTE ONLY): 23 min  Charges:  $Therapeutic Activity: 8-22 mins                     Verner Mould, DPT Acute Rehabilitation Services  Office [phone] Pager [phone]  07/23/2019 4:36 PM

## 2019-07-23 NOTE — Progress Notes (Signed)
PROGRESS NOTE    Arvel Oquinn  YYT:[id number] DOB: 1955-07-31    DOA: 07/08/2019   PCP: [name], [name], MD   Brief Narrative:  [name] is a 64 y/o male admitted on 6/20 with three weeks of diarrhea and weakness, noted on admission to have pancytopenia and fever.  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 He was admitted in ICU, subsequently ended up going to heart failure and had to be placed on a milrinone drip. Milrinone drip has been titrated off and is relatively stable, however he did test positive for rhinovirus which likely will maintain his cardiac issues. He became agitated requiring Precedex drip briefly and Levophed for hypotension.  He is off Levophed now. He is extremely deconditioned and needs to remain in the hospital for strengthening and for the PT OT evaluation. Need to touch base with oncology when to start his chemotherapy as a mentioned in second week of July.  Patient had a nosebleed followed by hemiparesis.  Pulmonology consulted, chest x-ray shows worsening consolidation.  Started on cefepime MRSA nares negative.  Assessment & Plan:   Active Problems:   Acute on chronic systolic heart failure (HCC)   Pressure ulcer   Cardiogenic shock (HCC)   Acute respiratory failure with hypoxia (HCC)   Severe sepsis (HCC)   Neutropenic sepsis (HCC)   Persistent atrial fibrillation (HCC)  Acute on chronic respiratory failure multifactorial:   Causes rhinovirus pneumonia /Decompensated heart failure , Possible obstructive lung disease at baseline - Continue Pulmicort, Xopenex, Atrovent -Out of bed to chair as tolerated.  Acute decompensated systolic heart failure Severe mitral regurg Echo:  LVEF 35-40%  global hypokinesis, severe MR, large pleural effusion -Continue Plavix. -lasix switched to oral -Continue midodrine -off milrinone infusion.  Hypokalemia,  hypophosphatemia -Continue repletion.   Atrial fibrillation Continue Tele Xarelto, digoxin, amiodarone. Cardiology following  Pancytopenia in setting of chemotherapy use for lymphoma> improving counts No evidence of brisk bleeding but fecal occult blood test positive on stool card on admission Transfuse PRBC for Hgb < 7 gm/dL Outpatient GI work up.  Diffuse large cell lymphoma Hold immunosuppressive cancer treatment given frailty Oncology continues to follow -Plan is to possibly continue chemotherapy second week of July  Pressure ulcer present on admission Wound care.  Seizure disorder Continue home lamictal, keppra  Severe protein calorie malnutrition Continue regular diet + ensure/boost/prostat/MVI   DVT prophylaxis: SCDs, Xarelto on hold. Code Status: Full code Family Communication: Discussed with patient and the daughter at bedside Disposition Plan: Anticipated discharge to skilled nursing facility or home with home PT.  Dispo: The patient is from:Home Anticipated d/c is to:SNF or Home with home PT Anticipated d/c date is:1-2 days Patient currently is not medically stable to d/c.   Consultants:   PCCM, oncology, cardiology  Procedures:  Antimicrobials: Anti-infectives (From admission, onward)   Start     Dose/Rate Route Frequency Ordered Stop   07/22/19 1730  ceFEPIme (MAXIPIME) 2 g in sodium chloride 0.9 % 100 mL IVPB     Discontinue     2 g 200 mL/hr over 30 Minutes Intravenous Every 8 hours 07/22/19 1701     07/13/19 2200  vancomycin (VANCOREADY) IVPB 750 mg/150 mL  Status:  Discontinued        750 mg 150 mL/hr over 60 Minutes Intravenous Every 12 hours 07/13/19 1328 07/13/19 1609   07/13/19 2200  vancomycin (VANCOCIN) IVPB 1000 mg/200 mL premix  Status:  Discontinued        1,000 mg 200 mL/hr over 60 Minutes Intravenous Every 12 hours 07/13/19 1609 07/15/19 1053   07/13/19 1200  metroNIDAZOLE (FLAGYL) IVPB  500 mg  Status:  Discontinued        500 mg 100 mL/hr over 60 Minutes Intravenous Every 8 hours 07/13/19 0851 07/15/19 1053   07/13/19 1000  vancomycin (VANCOREADY) IVPB 750 mg/150 mL  Status:  Discontinued        750 mg 150 mL/hr over 60 Minutes Intravenous Every 8 hours 07/13/19 0851 07/13/19 1328   07/13/19 0900  ceFEPIme (MAXIPIME) 2 g in sodium chloride 0.9 % 100 mL IVPB  Status:  Discontinued        2 g 200 mL/hr over 30 Minutes Intravenous Every 8 hours 07/13/19 0849 07/16/19 1116   07/09/19 0900  ceFEPIme (MAXIPIME) 2 g in sodium chloride 0.9 % 100 mL IVPB  Status:  Discontinued        2 g 200 mL/hr over 30 Minutes Intravenous Every 8 hours 07/09/19 0846 07/12/19 1150   07/09/19 0600  metroNIDAZOLE (FLAGYL) IVPB 500 mg  Status:  Discontinued        500 mg 100 mL/hr over 60 Minutes Intravenous Every 8 hours 07/08/19 1819 07/12/19 1044   07/09/19 0200  vancomycin (VANCOREADY) IVPB 750 mg/150 mL  Status:  Discontinued        750 mg 150 mL/hr over 60 Minutes Intravenous Every 8 hours 07/08/19 1834 07/12/19 1150   07/08/19 2200  ceFEPIme (MAXIPIME) 2 g in sodium chloride 0.9 % 100 mL IVPB  Status:  Discontinued        2 g 200 mL/hr over 30 Minutes Intravenous Every 8 hours 07/08/19 1825 07/09/19 0846   07/08/19 1415  vancomycin (VANCOREADY) IVPB 1750 mg/350 mL        1,750 mg 175 mL/hr over 120 Minutes Intravenous  Once 07/08/19 1400 07/08/19 1825   07/08/19 1400  ceFEPIme (MAXIPIME) 2 g in sodium chloride 0.9 % 100 mL IVPB        2 g 200 mL/hr over 30 Minutes Intravenous  Once 07/08/19 1352 07/08/19 1542   07/08/19 1400  metroNIDAZOLE (FLAGYL) IVPB 500 mg        500 mg 100 mL/hr over 60 Minutes Intravenous  Once 07/08/19 1352 07/08/19 1954   07/08/19 1400  vancomycin (VANCOCIN) IVPB 1000 mg/200 mL premix  Status:  Discontinued        1,000 mg 200 mL/hr over 60 Minutes Intravenous  Once 07/08/19 1352 07/08/19 1400      Subjective: Patient was seen and examined at bedside.   overnight events noted.  Reports feeling much better, denies any further episodes of hematemesis.  He is asking when he can be discharged. Objective: Vitals:   07/23/19 0500 07/23/19 0752 07/23/19 0900 07/23/19 1426  BP:  (!) 89/56  101/80  Pulse:  63  69  Resp:  20  14  Temp:  97.9 F (36.6 C)  98.1 F (36.7 C)  TempSrc:  Oral  Oral  SpO2:  96% 92% 97%  Weight: 80.1 kg     Height:        Intake/Output Summary (Last 24 hours) at 07/23/2019 1711 Last data filed at 07/23/2019 1618 Gross per 24 hour  Intake 845.58 ml  Output 2675 ml  Net -1829.42 ml   Filed Weights   07/21/19 0248 07/22/19 0500 07/23/19 0500  Weight: 76.6 kg 79.6 kg 80.1 kg    Examination:  General exam: Appears calm and comfortable  Respiratory system: Clear to auscultation. Respiratory effort normal. Cardiovascular system: S1 & S2 heard, RRR. No JVD, murmurs, rubs, gallops or clicks. No pedal edema. Gastrointestinal system: Abdomen is nondistended, soft and nontender. No organomegaly or masses felt. Normal bowel sounds heard. Central nervous system: Alert and oriented. No focal neurological deficits. Extremities: No edema, no swelling , no cyanosis. Skin: No rashes, lesions or ulcers Psychiatry: Judgement and insight appear normal. Mood & affect appropriate.     Data Reviewed: I have personally reviewed following labs and imaging studies  CBC: Recent Labs  Lab 07/17/19 0428 07/18/19 1002 07/20/19 0515 07/21/19 0356 07/22/19 0500 07/22/19 1213 07/23/19 0428  WBC 7.0   < > 5.5 5.0 5.1 4.6 4.3  NEUTROABS 6.2  --   --   --   --   --   --   HGB 7.8*   < > 8.3* 7.3* 7.0* 7.3* 8.0*  HCT 25.2*   < > 26.9* 23.7* 22.5* 23.2* 25.1*  MCV 91.3   < > 91.8 91.9 91.5 92.4 89.0  PLT 66*   < > 94* 97* 110* 107* 106*   < > = values in this interval not displayed.   Basic Metabolic Panel: Recent Labs  Lab 07/17/19 0428 07/17/19 2045 07/18/19 0407 07/18/19 1002 07/19/19 0424 07/20/19 0515 07/21/19 0356  07/22/19 0500 07/23/19 0428  NA 137  --   --    < > 140 138 134* 136 133*  K 5.2*  --   --    < > 2.8* 3.5 4.0 4.0 3.2*  CL 97*  --   --    < > 94* 93* 92* 92* 91*  CO2 29  --   --    < > 35* 36* 33* 33* 35*  GLUCOSE 136*  --   --    < > 83 82 77 80 76  BUN 24*  --   --    < > 17 19 18 18 19   CREATININE 0.81  --   --    < > 0.74 0.75 0.81 0.89 0.76  CALCIUM 7.1*  --   --    < > 7.3* 7.9* 7.5* 7.4* 7.4*  MG  --   --   --   --   --  1.9 1.9 1.8 1.8  PHOS 1.3* 2.9 2.7  --   --   --   --  2.4* 2.8   < > = values in this interval not displayed.   GFR: Estimated Creatinine Clearance: 105.1 mL/min (by C-G formula based on SCr of 0.76 mg/dL). Liver Function Tests: Recent Labs  Lab 07/17/19 0428 07/18/19 1002 07/22/19 0500 07/23/19 0428  AST 23 30 22 24   ALT 28 33 23 21  ALKPHOS 69 80 63 59  BILITOT 0.7 0.5 0.6 0.5  PROT 4.3* 4.8* 4.2* 4.2*  ALBUMIN 2.0* 2.4* 2.0* 2.0*   No results for input(s): LIPASE, AMYLASE in the last 168 hours. No results for input(s): AMMONIA in the last 168 hours. Coagulation Profile: No results for input(s): INR, PROTIME in the last 168 hours. Cardiac Enzymes: No results for input(s): CKTOTAL, CKMB, CKMBINDEX, TROPONINI in the last 168 hours. BNP (last 3 results) No results for input(s): PROBNP in the last 8760 hours. HbA1C: No results for input(s): HGBA1C in the last 72 hours. CBG: No results for input(s): GLUCAP in the last 168 hours. Lipid Profile: No results for input(s): CHOL, HDL, LDLCALC, TRIG, CHOLHDL, LDLDIRECT  in the last 72 hours. Thyroid Function Tests: No results for input(s): TSH, T4TOTAL, FREET4, T3FREE, THYROIDAB in the last 72 hours. Anemia Panel: No results for input(s): VITAMINB12, FOLATE, FERRITIN, TIBC, IRON, RETICCTPCT in the last 72 hours. Sepsis Labs: No results for input(s): PROCALCITON, LATICACIDVEN in the last 168 hours.  Recent Results (from the past 240 hour(s))  Respiratory Panel by PCR     Status: Abnormal    Collection Time: 07/15/19 12:36 AM  Result Value Ref Range Status   Adenovirus NOT DETECTED NOT DETECTED Final   Coronavirus 229E NOT DETECTED NOT DETECTED Final    Comment: (NOTE) The Coronavirus on the Respiratory Panel, DOES NOT test for the novel  Coronavirus (2019 nCoV)    Coronavirus HKU1 NOT DETECTED NOT DETECTED Final   Coronavirus NL63 NOT DETECTED NOT DETECTED Final   Coronavirus OC43 NOT DETECTED NOT DETECTED Final   Metapneumovirus NOT DETECTED NOT DETECTED Final   Rhinovirus / Enterovirus DETECTED (A) NOT DETECTED Final   Influenza A NOT DETECTED NOT DETECTED Final   Influenza B NOT DETECTED NOT DETECTED Final   Parainfluenza Virus 1 NOT DETECTED NOT DETECTED Final   Parainfluenza Virus 2 NOT DETECTED NOT DETECTED Final   Parainfluenza Virus 3 NOT DETECTED NOT DETECTED Final   Parainfluenza Virus 4 NOT DETECTED NOT DETECTED Final   Respiratory Syncytial Virus NOT DETECTED NOT DETECTED Final   Bordetella pertussis NOT DETECTED NOT DETECTED Final   Chlamydophila pneumoniae NOT DETECTED NOT DETECTED Final   Mycoplasma pneumoniae NOT DETECTED NOT DETECTED Final    Comment: Performed at [facility], [place]. [address], [place], [place] [address]  MRSA PCR Screening     Status: None   Collection Time: 07/22/19  5:02 PM   Specimen: Nasal Mucosa; Nasopharyngeal  Result Value Ref Range Status   MRSA by PCR NEGATIVE NEGATIVE Final    Comment:        The GeneXpert MRSA Assay (FDA approved for NASAL specimens only), is one component of a comprehensive MRSA colonization surveillance program. It is not intended to diagnose MRSA infection nor to guide or monitor treatment for MRSA infections. Performed at [facility], [place] [address], [place], [place] [address]     Radiology Studies: DG CHEST PORT 1 VIEW  Result Date: 07/22/2019 CLINICAL DATA:  Hemoptysis, history of CHF and MI EXAM: PORTABLE CHEST 1 VIEW COMPARISON:  07/17/2019 FINDINGS: RIGHT-sided  PICC line terminates at the caval to atrial junction. Trachea is midline. Cardiomediastinal contours obscured by increasing basilar opacity particularly in the LEFT chest. Interstitial and airspace opacity bilaterally. Overall similar appearance with the exception of increasing opacity at the LEFT lower lobe. Visualized skeletal structures are unremarkable. Incidental imaging of upper abdominal contents suggest bowel distension. IMPRESSION: Increasing basilar opacity at the LEFT lung base may represent worsening consolidation and or developing effusion. Interstitial and airspace disease bilaterally otherwise with similar appearance. Question of bowel distension in the upper abdomen perhaps even small bowel distension. Correlate with any worsening abdominal symptoms with dedicated abdominal imaging as warranted. These results will be called to the ordering clinician or representative by the Radiologist Assistant, and communication documented in the PACS or [employer]. Electronically Signed   By: Zetta Bills M.D.   On: 07/22/2019 16:47    Scheduled Meds: . sodium chloride   Intravenous Once  . amiodarone  200 mg Oral Daily  . budesonide (PULMICORT) nebulizer solution  0.5 mg Nebulization BID  . chlorhexidine  15 mL Mouth Rinse  BID  . Chlorhexidine Gluconate Cloth  6 each Topical Daily  . clopidogrel  75 mg Oral Daily  . digoxin  0.125 mg Oral Daily  . feeding supplement (ENSURE ENLIVE)  237 mL Oral BID  . feeding supplement (PRO-STAT SUGAR FREE 64)  30 mL Oral TID BM  . furosemide  40 mg Oral Daily  . ipratropium  0.5 mg Inhalation TID  . lamoTRIgine  100 mg Oral BID  . levalbuterol  1.25 mg Nebulization TID  . levETIRAcetam  750 mg Oral BID  . mouth rinse  15 mL Mouth Rinse q12n4p  . midodrine  5 mg Oral TID WC  . mirtazapine  15 mg Oral QHS  . multivitamin with minerals  1 tablet Oral Daily  . oxymetazoline  2 spray Each Nare BID  . pantoprazole (PROTONIX) IV  40 mg Intravenous Q24H    . potassium chloride  40 mEq Oral Once  . rosuvastatin  5 mg Oral QHS  . sertraline  25 mg Oral Daily  . sodium chloride flush  10-40 mL Intracatheter Q12H  . sterile water (preservative free)       Continuous Infusions: . sodium chloride Stopped (07/20/19 1644)  . ceFEPime (MAXIPIME) IV 2 g (07/23/19 1324)     LOS: 15 days    Time spent: 35 mins.  Shawna Clamp, MD [facility]   If 7PM-7AM, please contact night-coverage

## 2019-07-23 NOTE — TOC Progression Note (Signed)
Transition of Care [facility]) - Progression Note    Patient Details  Name: Estiven Kohan MRN: [id number] Date of Birth: [birth year]  Transition of Care [facility]) CM/SW Contact  Joaquin Courts, RN Phone Number: 07/23/2019, 12:23 PM  Clinical Narrative:    CM received call back from patient's spouse and discussed [facility] services.  Spouse states that [facility] services are not covered under the patient's insurance policy and she has been working with the patient's oncologist and his palliative team in the office to see if palliative services will be a better fit for patient's needs at home.  Patient also has a list of exercises to perform obtained from outpatient PT office and spouse states she can take him to the outpatient PT location if needed.  At this time spouse feels HH is not a feasible option.  Spouse reports she has all required dme at home and should she need anything additional she is prepared to go to discount medical supply store to obtain it.  At this time no further CM needs are identified.   Expected Discharge Plan: [place] Barriers to Discharge: Continued Medical Work up  Expected Discharge Plan and Services Expected Discharge Plan: [place]   Discharge Planning Services: CM Consult   Living arrangements for the past 2 months: Single Family Home                                       Social Determinants of Health (SDOH) Interventions    Readmission Risk Interventions Readmission Risk Prevention Plan 07/23/2019  Transportation Screening Complete  Medication Review [employer]) Complete  HRI or [place] Complete  SW Recovery Care/Counseling Consult Complete  Palliative Care Screening Not [place] Patient Refused  Some recent data might be hidden

## 2019-07-23 NOTE — Progress Notes (Signed)
Progress Note  Patient Name: Tony Saunders Date of Encounter: 07/23/2019  Primary Cardiologist: Glenetta Hew, MD   Subjective   No further epistaxis/hemoptysis, though there is blood tinged sputum in his Yankauer suction catheter. Feels breathing is improved. No chest pain. Making good urine on oral lasix.   Inpatient Medications    Scheduled Meds: . sodium chloride   Intravenous Once  . amiodarone  200 mg Oral Daily  . budesonide (PULMICORT) nebulizer solution  0.5 mg Nebulization BID  . chlorhexidine  15 mL Mouth Rinse BID  . Chlorhexidine Gluconate Cloth  6 each Topical Daily  . clopidogrel  75 mg Oral Daily  . digoxin  0.125 mg Oral Daily  . feeding supplement (ENSURE ENLIVE)  237 mL Oral BID  . feeding supplement (PRO-STAT SUGAR FREE 64)  30 mL Oral TID BM  . furosemide  40 mg Oral Daily  . ipratropium  0.5 mg Inhalation TID  . lamoTRIgine  100 mg Oral BID  . levalbuterol  1.25 mg Nebulization TID  . levETIRAcetam  750 mg Oral BID  . mouth rinse  15 mL Mouth Rinse q12n4p  . midodrine  5 mg Oral TID WC  . mirtazapine  15 mg Oral QHS  . multivitamin with minerals  1 tablet Oral Daily  . oxymetazoline  2 spray Each Nare BID  . pantoprazole (PROTONIX) IV  40 mg Intravenous Q24H  . rosuvastatin  5 mg Oral QHS  . sertraline  25 mg Oral Daily  . sodium chloride flush  10-40 mL Intracatheter Q12H  . sterile water (preservative free)       Continuous Infusions: . sodium chloride Stopped (07/20/19 1644)  . ceFEPime (MAXIPIME) IV 2 g (07/23/19 0550)   PRN Meds: sodium chloride, docusate sodium, lip balm, loperamide, polyethylene glycol, sodium chloride flush   Vital Signs    Vitals:   07/22/19 2233 07/22/19 2234 07/23/19 0500 07/23/19 0752  BP: 108/66   (!) 89/56  Pulse: 98   63  Resp:  20  20  Temp:  98.7 F (37.1 C)  97.9 F (36.6 C)  TempSrc:  Oral  Oral  SpO2: 99%   96%  Weight:   80.1 kg   Height:        Intake/Output Summary (Last 24 hours) at  07/23/2019 0843 Last data filed at 07/23/2019 0556 Gross per 24 hour  Intake 709.58 ml  Output 2925 ml  Net -2215.42 ml   Filed Weights   07/21/19 0248 07/22/19 0500 07/23/19 0500  Weight: 76.6 kg 79.6 kg 80.1 kg    Telemetry    Atrial fibrillation, rate controlled in the 70s - Personally Reviewed  ECG    No new tracings since 7/2- Personally Reviewed  Physical Exam   GEN: frail, cachectic appearing gentleman in no acute distress NECK: No JVD at 45 degrees CARDIAC: irregularly irregular rhythm, normal S1 and S2, no rubs or gallops. 2/6 systolic murmur. RESPIRATORY:  Still somewhat coarse but greatly improved from prior ABDOMEN: Soft, non-tender, non-distended MUSCULOSKELETAL:  Moves all 4 limbs independently SKIN: Warm and dry, 1+ ankle edema NEUROLOGIC:  Alert and oriented x 3. No focal neuro deficits noted. PSYCHIATRIC:  Normal affect   Labs    Chemistry Recent Labs  Lab 07/18/19 1002 07/19/19 0424 07/21/19 0356 07/22/19 0500 07/23/19 0428  NA 138   < > 134* 136 133*  K 3.3*   < > 4.0 4.0 3.2*  CL 94*   < > 92* 92* 91*  CO2 34*   < > 33* 33* 35*  GLUCOSE 116*   < > 77 80 76  BUN 22   < > 18 18 19   CREATININE 0.80   < > 0.81 0.89 0.76  CALCIUM 7.6*   < > 7.5* 7.4* 7.4*  PROT 4.8*  --   --  4.2* 4.2*  ALBUMIN 2.4*  --   --  2.0* 2.0*  AST 30  --   --  22 24  ALT 33  --   --  23 21  ALKPHOS 80  --   --  63 59  BILITOT 0.5  --   --  0.6 0.5  GFRNONAA >60   < > >60 >60 >60  GFRAA >60   < > >60 >60 >60  ANIONGAP 10   < > 9 11 7    < > = values in this interval not displayed.     Hematology Recent Labs  Lab 07/22/19 0500 07/22/19 1213 07/23/19 0428  WBC 5.1 4.6 4.3  RBC 2.46* 2.51* 2.82*  HGB 7.0* 7.3* 8.0*  HCT 22.5* 23.2* 25.1*  MCV 91.5 92.4 89.0  MCH 28.5 29.1 28.4  MCHC 31.1 31.5 31.9  RDW 20.8* 20.8* 19.5*  PLT 110* 107* 106*    Cardiac EnzymesNo results for input(s): TROPONINI in the last 168 hours. No results for input(s): TROPIPOC in the  last 168 hours.   BNP No results for input(s): BNP, PROBNP in the last 168 hours.   DDimer No results for input(s): DDIMER in the last 168 hours.   Radiology    DG CHEST PORT 1 VIEW  Result Date: 07/22/2019 CLINICAL DATA:  Hemoptysis, history of CHF and MI EXAM: PORTABLE CHEST 1 VIEW COMPARISON:  07/17/2019 FINDINGS: RIGHT-sided PICC line terminates at the caval to atrial junction. Trachea is midline. Cardiomediastinal contours obscured by increasing basilar opacity particularly in the LEFT chest. Interstitial and airspace opacity bilaterally. Overall similar appearance with the exception of increasing opacity at the LEFT lower lobe. Visualized skeletal structures are unremarkable. Incidental imaging of upper abdominal contents suggest bowel distension. IMPRESSION: Increasing basilar opacity at the LEFT lung base may represent worsening consolidation and or developing effusion. Interstitial and airspace disease bilaterally otherwise with similar appearance. Question of bowel distension in the upper abdomen perhaps even small bowel distension. Correlate with any worsening abdominal symptoms with dedicated abdominal imaging as warranted. These results will be called to the ordering clinician or representative by the Radiologist Assistant, and communication documented in the PACS or [employer]. Electronically Signed   By: Zetta Bills M.D.   On: 07/22/2019 16:47    Cardiac Studies   Echo 06/24/19 1. Left ventricular ejection fraction, by estimation, is 35 to 40%. The  left ventricle has moderately decreased function. The left ventricle  demonstrates global hypokinesis. The left ventricular internal cavity size  was mildly dilated. Left ventricular  diastolic parameters are indeterminate.  2. Right ventricular systolic function is normal. The right ventricular  size is normal. Tricuspid regurgitation signal is inadequate for assessing  PA pressure.  3. Left atrial size was mildly dilated.   4. Right atrial size was mildly dilated.  5. The mitral valve is normal in structure. Severe mitral valve  regurgitation from teathering of posterior MV leaflet. No evidence of  mitral stenosis.  6. The aortic valve is tricuspid. Aortic valve regurgitation is not  visualized. Mild to moderate aortic valve sclerosis/calcification is  present, without any evidence of aortic stenosis.  7. The  inferior vena cava is normal in size with greater than 50%  respiratory variability, suggesting right atrial pressure of 3 mmHg.  8. Large pleural effusion in the left lateral region. No pericardial  effusion.   Patient Profile     64 y.o. 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   Assessment & Plan    Acute on chronic combined CHF with severe mitral regurgitation: Echo this admission with EF 35-40% with severe MR, previously EF 30-35% with moderate-severe MR.  -He has required inotropic support and BiPAP this admission.  -Admission weight 77.1 kg, peak weight 85.4 kg 6/22, weight today 80.1 kg. Net negative 16 L this admission, which does not match weight trends. -suspect some edema likely will be persistent due to poor nutritional status and hypoalbuminemia (last albumin 2.0) -Continue PO lasix 40mg  daily -Continue digoxin -Continue to monitor strict I&Os and daily weights -Continue to monitor electrolytes closely to maintain K >4, Mg >2. Potassium has been highly variable.  Persistent atrial fibrillation/atypical atrial flutter: rates are stable this admission -amiodarone is predominantly rate control given limited options with his hypotension. No plans for  rhythm control given history of failed cardioversions, severe MR, and severe illness  -Continue digoxin for rate control -rivaroxaban stopped as below. Chadsvasc=2  Anemia/thrombocytopenia/epistaxis: -rivaroxaban and clopidogrel stopped 7/4. Would keep rivaroxaban off for now but restarting clopidogrel today given PCI 03/2019 (extensive intervention).  Multivessel CAD s/p PCI to all 3 major vessels: last intervention 03/2019.  -has been asymptomatic -as above, would keep at least clopidogrel if able. He was not on aspirin given that he was on clopidogrel/rivaroxaban. With thrombocytopenia, will hold on adding aspirin for now, but monitor closely. Being on no antiplatelets places him at risk for stent thrombosis.  Diffuse large B cell lymphoma: diffuse, extensive disease.  -chemotherapy delayed given ongoing acute illness. Palliative care is assisting with [facility] discussions.  - Continue management per primary team, oncology, and palliative care  Acute on chronic respiratory failure: -multifactorial, but rhinovirus pneumonia likely acute trigger  For questions or updates, please contact [facility] Please consult [email or web address] for contact info under Cardiology/STEMI.     Signed, Buford Dresser, MD  07/23/2019, 8:43 AM

## 2019-07-23 NOTE — Progress Notes (Signed)
Occupational Therapy Treatment Patient Details Name: Tony Saunders MRN: [id number] DOB: Jun 08, 1955 Today's Date: 07/23/2019    History of present illness Pt to ED 2* weakness and admitted with acute hypoxemic respiratory failure, acute decompensated, systolic CHF and severe pancytopenia 2* chemo and increasing L LE edema 2* ongoing Lymphoma.  Pt with hx of MI, seizure disorder, CHF, PAF - pt underwent Cardioversion 06/22/19   OT comments  Pt needed significant A this day/  Spoke with family about need for A . Wife insistent on DC home  Follow Up Recommendations  Home health OT;Supervision/Assistance - 24 hour;SNF          Precautions / Restrictions Precautions Precautions: Fall Precaution Comments: very frail, painful left foot. knees subject to buckle.  DROPLET Restrictions Weight Bearing Restrictions: No       Mobility Bed Mobility Overal bed mobility: Needs Assistance Bed Mobility: Supine to Sit Rolling: Mod assist         General bed mobility comments: sitting EOB with OT at PT arrival  Transfers Overall transfer level: Needs assistance Equipment used: Rolling walker (2 wheeled) Transfers: Sit to/from [facility] Sit to Stand: Mod assist;Max assist;+2 physical assistance;+2 safety/equipment Stand pivot transfers: Mod assist;+2 physical assistance;+2 safety/equipment       General transfer comment: cues for use of UE's/technique for power up with RW. Mod-Max assist +2 to complete power up/rise to RW. pt c/o dizziness in standing and returned to sitting EOB after ~ 1 minute. Pt performed 2nd sit to stand and BP stable 109/64, completed stand step transfer to recliner with Mod assist +2 to steady and manage walker, VC's for safe reach back to control lowering.     Balance Overall balance assessment: Needs assistance;History of Falls Sitting-balance support: Feet supported;Bilateral upper extremity supported Sitting balance-Leahy Scale: Fair      Standing balance support: Bilateral upper extremity supported;During functional activity Standing balance-Leahy Scale: Poor Standing balance comment: reliant on external support                           ADL either performed or assessed with clinical judgement   ADL   Eating/Feeding: Set up;Sitting   Grooming: Set up;Sitting                   Toilet Transfer: Moderate assistance;+2 for physical assistance;Cueing for safety Toilet Transfer Details (indicate cue type and reason): bed ot chair                 Vision Patient Visual Report: No change from baseline            Cognition Arousal/Alertness: Awake/alert Behavior During Therapy: WFL for tasks assessed/performed Overall Cognitive Status: Within Functional Limits for tasks assessed                                                     Pertinent Vitals/ Pain       Pain Assessment: Faces Faces Pain Scale: Hurts little more Pain Location: general discomfort with mobilizing Pain Descriptors / Indicators: Discomfort;Grimacing Pain Intervention(s): Limited activity within patient's tolerance;Monitored during session;Repositioned         Frequency  Min 2X/week        Progress Toward Goals  OT Goals(current goals can now be found in the care plan section)  Progress towards OT goals: Progressing toward goals  Acute Rehab OT Goals Patient Stated Goal: to go home OT Goal Formulation: With patient  Plan Discharge plan remains appropriate    Co-evaluation      Reason for Co-Treatment: To address functional/ADL transfers;For patient/therapist safety PT goals addressed during session: Mobility/safety with mobility;Balance;Proper use of DME        AM-PAC OT "6 Clicks" Daily Activity     Outcome Measure   Help from another person eating meals?: None Help from another person taking care of personal grooming?: A Little Help from another person toileting, which  includes using toliet, bedpan, or urinal?: A Lot Help from another person bathing (including washing, rinsing, drying)?: A Lot Help from another person to put on and taking off regular upper body clothing?: A Little Help from another person to put on and taking off regular lower body clothing?: Total 6 Click Score: 15    End of Session Equipment Utilized During Treatment: Rolling walker;Oxygen  OT Visit Diagnosis: Muscle weakness (generalized) (M62.81);Pain   Activity Tolerance Patient tolerated treatment well;No increased pain;Patient limited by fatigue   Patient Left in chair;with call bell/phone within reach   Nurse Communication Mobility status        Time: 1350-1403 OT Time Calculation (min): 13 min  Charges: OT General Charges $OT Visit: 1 Visit OT Treatments $Self Care/Home Management : 8-22 mins  Kari Baars, [place] [phone] Office- [phone], Edwena Felty D 07/23/2019, 4:47 PM

## 2019-07-24 DIAGNOSIS — D649 Anemia, unspecified: Secondary | ICD-10-CM

## 2019-07-24 LAB — COMPREHENSIVE METABOLIC PANEL
ALT: 22 U/L (ref 0–44)
AST: 26 U/L (ref 15–41)
Albumin: 1.8 g/dL — ABNORMAL LOW (ref 3.5–5.0)
Alkaline Phosphatase: 63 U/L (ref 38–126)
Anion gap: 10 (ref 5–15)
BUN: 18 mg/dL (ref 8–23)
CO2: 33 mmol/L — ABNORMAL HIGH (ref 22–32)
Calcium: 7.5 mg/dL — ABNORMAL LOW (ref 8.9–10.3)
Chloride: 93 mmol/L — ABNORMAL LOW (ref 98–111)
Creatinine, Ser: 0.76 mg/dL (ref 0.61–1.24)
GFR calc Af Amer: >60 mL/min (ref >60)
GFR calc non Af Amer: >60 mL/min (ref >60)
Glucose, Bld: 83 mg/dL (ref 70–99)
Potassium: 3.6 mmol/L (ref 3.5–5.1)
Sodium: 136 mmol/L (ref 135–145)
Total Bilirubin: 0.6 mg/dL (ref 0.3–1.2)
Total Protein: 4.2 g/dL — ABNORMAL LOW (ref 6.5–8.1)

## 2019-07-24 LAB — MAGNESIUM: Magnesium: 1.8 mg/dL (ref 1.7–2.4)

## 2019-07-24 LAB — CBC
HCT: 24.7 % — ABNORMAL LOW (ref 39.0–52.0)
Hemoglobin: 7.9 g/dL — ABNORMAL LOW (ref 13.0–17.0)
MCH: 28.5 pg (ref 26.0–34.0)
MCHC: 32 g/dL (ref 30.0–36.0)
MCV: 89.2 fL (ref 80.0–100.0)
Platelets: 101 10*3/uL — ABNORMAL LOW (ref 150–400)
RBC: 2.77 MIL/uL — ABNORMAL LOW (ref 4.22–5.81)
RDW: 19.7 % — ABNORMAL HIGH (ref 11.5–15.5)
WBC: 4 10*3/uL (ref 4.0–10.5)
nRBC: 0 % (ref 0.0–0.2)

## 2019-07-24 LAB — PHOSPHORUS: Phosphorus: 2.2 mg/dL — ABNORMAL LOW (ref 2.5–4.6)

## 2019-07-24 MED ORDER — TRAMADOL HCL 50 MG PO TABS
50.0000 mg | ORAL_TABLET | Freq: Four times a day (QID) | ORAL | Status: DC | PRN
  Administered 2019-07-25: 50 mg via ORAL
  Filled 2019-07-24: qty 1

## 2019-07-24 MED ORDER — PANTOPRAZOLE SODIUM 40 MG PO TBEC
40.0000 mg | DELAYED_RELEASE_TABLET | Freq: Every day | ORAL | Status: DC
  Administered 2019-07-24: 40 mg via ORAL
  Filled 2019-07-24: qty 1

## 2019-07-24 NOTE — Progress Notes (Signed)

## 2019-07-24 NOTE — Progress Notes (Addendum)
Progress Note  Patient Name: Tony Saunders Date of Encounter: 07/24/2019  Primary Cardiologist: Glenetta Hew, MD   Subjective   Has coughing and ongoing chest congestion. No complaints of chest pain. Breathing is stable.   Inpatient Medications    Scheduled Meds: . sodium chloride   Intravenous Once  . amiodarone  200 mg Oral Daily  . budesonide (PULMICORT) nebulizer solution  0.5 mg Nebulization BID  . chlorhexidine  15 mL Mouth Rinse BID  . Chlorhexidine Gluconate Cloth  6 each Topical Daily  . clopidogrel  75 mg Oral Daily  . digoxin  0.125 mg Oral Daily  . feeding supplement (ENSURE ENLIVE)  237 mL Oral BID  . feeding supplement (PRO-STAT SUGAR FREE 64)  30 mL Oral TID BM  . furosemide  40 mg Oral Daily  . ipratropium  0.5 mg Inhalation TID  . lamoTRIgine  100 mg Oral BID  . levalbuterol  1.25 mg Nebulization TID  . levETIRAcetam  750 mg Oral BID  . mouth rinse  15 mL Mouth Rinse q12n4p  . midodrine  5 mg Oral TID WC  . mirtazapine  15 mg Oral QHS  . multivitamin with minerals  1 tablet Oral Daily  . oxymetazoline  2 spray Each Nare BID  . pantoprazole (PROTONIX) IV  40 mg Intravenous Q24H  . rosuvastatin  5 mg Oral QHS  . sertraline  25 mg Oral Daily  . sodium chloride flush  10-40 mL Intracatheter Q12H   Continuous Infusions: . sodium chloride Stopped (07/20/19 1644)  . ceFEPime (MAXIPIME) IV 2 g (07/24/19 7902)   PRN Meds: sodium chloride, docusate sodium, lip balm, loperamide, polyethylene glycol, sodium chloride flush   Vital Signs    Vitals:   07/24/19 0500 07/24/19 0620 07/24/19 0836 07/24/19 0953  BP:  (!) 95/58  (!) 94/59  Pulse:  68  71  Resp:  16    Temp:  97.6 F (36.4 C)    TempSrc:  Oral    SpO2:  96% 95% 93%  Weight: 78.3 kg     Height:        Intake/Output Summary (Last 24 hours) at 07/24/2019 1101 Last data filed at 07/24/2019 0620 Gross per 24 hour  Intake 240 ml  Output 1100 ml  Net -860 ml   Filed Weights   07/22/19  0500 07/23/19 0500 07/24/19 0500  Weight: 79.6 kg 80.1 kg 78.3 kg    Telemetry    Atrial fibrillation with CVR with occasional PVCs - Personally Reviewed  ECG    No new tracings - Personally Reviewed  Physical Exam   GEN: Ill appearing gentleman sitting on the edge of the hospital bed working with PT.   Neck: No JVD, no carotid bruits Cardiac: IRIR, no murmurs, rubs, or gallops.  Respiratory: clear to auscultation bilaterally, no wheezes/ rales/ rhonchi GI: NABS, Soft, nontender, non-distended  MS: 1-2+ pitting pedal edema L>R; No deformity. Neuro:  Nonfocal, moving all extremities spontaneously Psych: Normal affect   Labs    Chemistry Recent Labs  Lab 07/22/19 0500 07/23/19 0428 07/24/19 0334  NA 136 133* 136  K 4.0 3.2* 3.6  CL 92* 91* 93*  CO2 33* 35* 33*  GLUCOSE 80 76 83  BUN 18 19 18   CREATININE 0.89 0.76 0.76  CALCIUM 7.4* 7.4* 7.5*  PROT 4.2* 4.2* 4.2*  ALBUMIN 2.0* 2.0* 1.8*  AST 22 24 26   ALT 23 21 22   ALKPHOS 63 59 63  BILITOT 0.6 0.5 0.6  GFRNONAA >60 >60 >60  GFRAA >60 >60 >60  ANIONGAP 11 7 10      Hematology Recent Labs  Lab 07/22/19 1213 07/23/19 0428 07/24/19 0334  WBC 4.6 4.3 4.0  RBC 2.51* 2.82* 2.77*  HGB 7.3* 8.0* 7.9*  HCT 23.2* 25.1* 24.7*  MCV 92.4 89.0 89.2  MCH 29.1 28.4 28.5  MCHC 31.5 31.9 32.0  RDW 20.8* 19.5* 19.7*  PLT 107* 106* 101*    Cardiac EnzymesNo results for input(s): TROPONINI in the last 168 hours. No results for input(s): TROPIPOC in the last 168 hours.   BNPNo results for input(s): BNP, PROBNP in the last 168 hours.   DDimer No results for input(s): DDIMER in the last 168 hours.   Radiology    DG CHEST PORT 1 VIEW  Result Date: 07/22/2019 CLINICAL DATA:  Hemoptysis, history of CHF and MI EXAM: PORTABLE CHEST 1 VIEW COMPARISON:  07/17/2019 FINDINGS: RIGHT-sided PICC line terminates at the caval to atrial junction. Trachea is midline. Cardiomediastinal contours obscured by increasing basilar opacity  particularly in the LEFT chest. Interstitial and airspace opacity bilaterally. Overall similar appearance with the exception of increasing opacity at the LEFT lower lobe. Visualized skeletal structures are unremarkable. Incidental imaging of upper abdominal contents suggest bowel distension. IMPRESSION: Increasing basilar opacity at the LEFT lung base may represent worsening consolidation and or developing effusion. Interstitial and airspace disease bilaterally otherwise with similar appearance. Question of bowel distension in the upper abdomen perhaps even small bowel distension. Correlate with any worsening abdominal symptoms with dedicated abdominal imaging as warranted. These results will be called to the ordering clinician or representative by the Radiologist Assistant, and communication documented in the PACS or [employer]. Electronically Signed   By: Zetta Bills M.D.   On: 07/22/2019 16:47    Cardiac Studies   Echo 06/24/19 1. Left ventricular ejection fraction, by estimation, is 35 to 40%. The  left ventricle has moderately decreased function. The left ventricle  demonstrates global hypokinesis. The left ventricular internal cavity size  was mildly dilated. Left ventricular  diastolic parameters are indeterminate.  2. Right ventricular systolic function is normal. The right ventricular  size is normal. Tricuspid regurgitation signal is inadequate for assessing  PA pressure.  3. Left atrial size was mildly dilated.  4. Right atrial size was mildly dilated.  5. The mitral valve is normal in structure. Severe mitral valve  regurgitation from teathering of posterior MV leaflet. No evidence of  mitral stenosis.  6. The aortic valve is tricuspid. Aortic valve regurgitation is not  visualized. Mild to moderate aortic valve sclerosis/calcification is  present, without any evidence of aortic stenosis.  7. The inferior vena cava is normal in size with greater than 50%  respiratory  variability, suggesting right atrial pressure of 3 mmHg.  8. Large pleural effusion in the left lateral region. No pericardial  effusion.    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   Assessment & Plan    1. Acute on chronic combined CHF with severe MR: echo 06/24/19 with EF 35-40% with severe MR, previously 30-35% with moderate-severe MR. Required inotropic support and BiPAP this admission. He has been diuresed with IV  lasix, now on po lasix 40mg  daily. Weight was 170lbs on admission, peaked at 188lbs and diuresed down to 172lbs today. Maintaining net negative UOP on po lasix, -664mL in the past 24 hours and -20.7L this admission. He has a poor nutritional status and hypoalbuminemia which is likely contributing to his edema. Hypotension limits GDMT though BP overall stable off milrinone.  - Continue digoxin - Continue lasix 40mg  daily - Continue to monitor strict I&Os and daily weights - Continue to monitor electrolytes closely to maintain K >4, Mg >2  2. Persistent atrial fibrillation/atypical atrial flutter: rates are stable. Unfortunately experienced epistaxis on xarelto which was subsequently discontinued. Not on AV nodal blocking agents due to hypotension - Continue amiodarone for rhythm control - Continue digoxin for rate control  3. Multivessel CAD s/p PCI to all 3 major vessels: last intervention 03/2019. No anginal complaints this admission. Not on aspirin given need for anticoagulation and ongoing anemia/thrombocytopenia, though risks of being off plavix felt to outweigh benefits.  - Continue plavix for now -  Continue statin  4. Diffuse large B cell lymphoma: diffuse extensive disease. Chemotherapy delayed given ongoing acute illness. Palliative care is following this admission for ongoing [place] discussion - Continue management per primary team, oncology, and palliative care  5. Rhinovirus: ongoing chest congestion and coughing.  - Continue supportive care per primary team     For questions or updates, please contact [place] Please consult [email or web address] for contact info under Cardiology/STEMI.      Signed, Abigail Butts, PA-C  07/24/2019, 11:01 AM   [phone]  I have seen and examined the patient along with Abigail Butts, PA-C.  I have reviewed the chart, notes and new data.  I agree with PA/NP's note.  Key new complaints: breathing is much better. Able to lie fully flat. No angina. Weak cough. Key examination changes: large airway rhonchi, otherwise clear lung fields, JVP normal, no S3, no edema. Irregular rhythm, rate controlled. Key new findings / data: stable moderate anemia  PLAN: Seems to be euvolemic. Keep on sodium restricted diet and current furosemide dose. Daily weights, keep log, call office if wt up 3 lb in 24h or 5 lb over current weight. Conservative mgmt for CAD due to bleeding issues/thrombocytopenia. For same reason, not on anticoagulants.  Sanda Klein, MD, [place] [phone] 07/24/2019, 5:41 PM

## 2019-07-24 NOTE — Progress Notes (Signed)
Patient wife notified me that patient does qualify for 60 days of home health. He can also qualify for 20 days of outpatient PT if needed after home health is completed.

## 2019-07-24 NOTE — Progress Notes (Signed)
PROGRESS NOTE    Tony Saunders  QVZ:[id number] DOB: 06-23-1955    DOA: 07/08/2019   PCP: [name], [name], MD   Brief Narrative:  Tony Saunders is a 64 y/o male admitted on 6/20 with three weeks of diarrhea and weakness, noted on admission to have pancytopenia and fever.  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 He was admitted in ICU, subsequently ended up going to heart failure and had to be placed on a milrinone drip. Milrinone drip has been titrated off and is relatively stable, however he did test positive for rhinovirus which likely will maintain his cardiac issues. He became agitated requiring Precedex drip briefly and Levophed for hypotension.  He is off Levophed now. He is extremely deconditioned and needs to remain in the hospital for strengthening and for the PT OT evaluation. Need to touch base with oncology when to start his chemotherapy as a mentioned in second week of July.  Patient had a nosebleed followed by hemiparesis.  Pulmonology consulted, chest x-ray shows worsening consolidation.  Started on cefepime MRSA nares negative.  Assessment & Plan:   Active Problems:   Acute on chronic systolic heart failure (HCC)   Pressure ulcer   Cardiogenic shock (HCC)   Acute respiratory failure with hypoxia (HCC)   Severe sepsis (HCC)   Neutropenic sepsis (HCC)   Persistent atrial fibrillation (HCC)  Acute on chronic respiratory failure multifactorial:   Causes rhinovirus pneumonia /Decompensated heart failure , Possible obstructive lung disease at baseline - Continue Pulmicort, Xopenex, Atrovent -Out of bed to chair as tolerated.  Acute decompensated systolic heart failure Severe mitral regurg Echo:  LVEF 35-40%  global hypokinesis, severe MR, large pleural effusion -Continue Plavix. -lasix switched to oral -Continue midodrine -off milrinone infusion.  Hypokalemia,  hypophosphatemia -Continue repletion.   Atrial fibrillation Continue Tele  digoxin, amiodarone. xarelto dc due to epistaxis, Cardiology following, will discuss about resuming xarelto  Pancytopenia in setting of chemotherapy use for lymphoma> improving counts No evidence of brisk bleeding but fecal occult blood test positive on stool card on admission Transfuse PRBC for Hgb < 7 gm/dL Outpatient GI work up.  Diffuse large cell lymphoma Hold immunosuppressive cancer treatment given frailty Oncology continues to follow -Plan is to possibly continue chemotherapy second week of July  Pressure ulcer present on admission Wound care.  Seizure disorder Continue home lamictal, keppra  Severe protein calorie malnutrition Continue regular diet + ensure/boost/prostat/MVI   DVT prophylaxis: SCDs, Xarelto on hold. Code Status: Full code Family Communication: Discussed with patient and the daughter at bedside Disposition Plan: Anticipated discharge to skilled nursing facility or home with home PT.  Dispo: The patient is from:Home Anticipated d/c is to:SNF or Home with home PT Anticipated d/c date is:1-2 days Patient currently is not medically stable to d/c.   Consultants:   PCCM, oncology, cardiology  Procedures:  Antimicrobials: Anti-infectives (From admission, onward)   Start     Dose/Rate Route Frequency Ordered Stop   07/22/19 1730  ceFEPIme (MAXIPIME) 2 g in sodium chloride 0.9 % 100 mL IVPB     Discontinue     2 g 200 mL/hr over 30 Minutes Intravenous Every 8 hours 07/22/19 1701     07/13/19 2200  vancomycin (VANCOREADY) IVPB 750 mg/150 mL  Status:  Discontinued        750 mg 150 mL/hr over 60 Minutes Intravenous Every 12 hours 07/13/19 1328 07/13/19 1609   07/13/19 2200  vancomycin (VANCOCIN) IVPB 1000 mg/200 mL premix  Status:  Discontinued        1,000 mg 200 mL/hr over 60 Minutes Intravenous Every 12 hours 07/13/19 1609  07/15/19 1053   07/13/19 1200  metroNIDAZOLE (FLAGYL) IVPB 500 mg  Status:  Discontinued        500 mg 100 mL/hr over 60 Minutes Intravenous Every 8 hours 07/13/19 0851 07/15/19 1053   07/13/19 1000  vancomycin (VANCOREADY) IVPB 750 mg/150 mL  Status:  Discontinued        750 mg 150 mL/hr over 60 Minutes Intravenous Every 8 hours 07/13/19 0851 07/13/19 1328   07/13/19 0900  ceFEPIme (MAXIPIME) 2 g in sodium chloride 0.9 % 100 mL IVPB  Status:  Discontinued        2 g 200 mL/hr over 30 Minutes Intravenous Every 8 hours 07/13/19 0849 07/16/19 1116   07/09/19 0900  ceFEPIme (MAXIPIME) 2 g in sodium chloride 0.9 % 100 mL IVPB  Status:  Discontinued        2 g 200 mL/hr over 30 Minutes Intravenous Every 8 hours 07/09/19 0846 07/12/19 1150   07/09/19 0600  metroNIDAZOLE (FLAGYL) IVPB 500 mg  Status:  Discontinued        500 mg 100 mL/hr over 60 Minutes Intravenous Every 8 hours 07/08/19 1819 07/12/19 1044   07/09/19 0200  vancomycin (VANCOREADY) IVPB 750 mg/150 mL  Status:  Discontinued        750 mg 150 mL/hr over 60 Minutes Intravenous Every 8 hours 07/08/19 1834 07/12/19 1150   07/08/19 2200  ceFEPIme (MAXIPIME) 2 g in sodium chloride 0.9 % 100 mL IVPB  Status:  Discontinued        2 g 200 mL/hr over 30 Minutes Intravenous Every 8 hours 07/08/19 1825 07/09/19 0846   07/08/19 1415  vancomycin (VANCOREADY) IVPB 1750 mg/350 mL        1,750 mg 175 mL/hr over 120 Minutes Intravenous  Once 07/08/19 1400 07/08/19 1825   07/08/19 1400  ceFEPIme (MAXIPIME) 2 g in sodium chloride 0.9 % 100 mL IVPB        2 g 200 mL/hr over 30 Minutes Intravenous  Once 07/08/19 1352 07/08/19 1542   07/08/19 1400  metroNIDAZOLE (FLAGYL) IVPB 500 mg        500 mg 100 mL/hr over 60 Minutes Intravenous  Once 07/08/19 1352 07/08/19 1954   07/08/19 1400  vancomycin (VANCOCIN) IVPB 1000 mg/200 mL premix  Status:  Discontinued        1,000 mg 200 mL/hr over 60 Minutes Intravenous  Once 07/08/19 1352 07/08/19 1400       Subjective: Patient was seen and examined at bedside.  overnight events noted.  Reports feeling better but still weak, denies any further episodes of hematemesis.  He is asking when he can be discharged. Objective: Vitals:   07/24/19 0836 07/24/19 0953 07/24/19 1203 07/24/19 1358  BP:  (!) 94/59 (!) 104/59 94/61  Pulse:  71 67 65  Resp:    20  Temp:    98 F (36.7 C)  TempSrc:      SpO2: 95% 93% 99% 100%  Weight:      Height:        Intake/Output Summary (Last 24 hours) at 07/24/2019 1557 Last data filed at 07/24/2019 1237 Gross per 24 hour  Intake 948 ml  Output 1500 ml  Net -552 ml   Filed Weights   07/22/19 0500 07/23/19 0500 07/24/19 0500  Weight: 79.6  kg 80.1 kg 78.3 kg    Examination:  General exam: Appears calm and comfortable  Respiratory system: Clear to auscultation. Respiratory effort normal. Cardiovascular system: S1 & S2 heard, RRR. No JVD, murmurs, rubs, gallops or clicks. No pedal edema. Gastrointestinal system: Abdomen is nondistended, soft and nontender. No organomegaly or masses felt. Normal bowel sounds heard. Central nervous system: Alert and oriented. No focal neurological deficits. Extremities: No edema, no swelling , no cyanosis. Skin: No rashes, lesions or ulcers Psychiatry: Judgement and insight appear normal. Mood & affect appropriate.     Data Reviewed: I have personally reviewed following labs and imaging studies  CBC: Recent Labs  Lab 07/21/19 0356 07/22/19 0500 07/22/19 1213 07/23/19 0428 07/24/19 0334  WBC 5.0 5.1 4.6 4.3 4.0  HGB 7.3* 7.0* 7.3* 8.0* 7.9*  HCT 23.7* 22.5* 23.2* 25.1* 24.7*  MCV 91.9 91.5 92.4 89.0 89.2  PLT 97* 110* 107* 106* 638*   Basic Metabolic Panel: Recent Labs  Lab 07/17/19 2045 07/18/19 0407 07/18/19 1002 07/20/19 0515 07/21/19 0356 07/22/19 0500 07/23/19 0428 07/24/19 0334  NA  --   --    < > 138 134* 136 133* 136  K  --   --    < > 3.5 4.0 4.0 3.2* 3.6  CL  --   --    < > 93* 92* 92* 91*  93*  CO2  --   --    < > 36* 33* 33* 35* 33*  GLUCOSE  --   --    < > 82 77 80 76 83  BUN  --   --    < > 19 18 18 19 18   CREATININE  --   --    < > 0.75 0.81 0.89 0.76 0.76  CALCIUM  --   --    < > 7.9* 7.5* 7.4* 7.4* 7.5*  MG  --   --   --  1.9 1.9 1.8 1.8 1.8  PHOS 2.9 2.7  --   --   --  2.4* 2.8 2.2*   < > = values in this interval not displayed.   GFR: Estimated Creatinine Clearance: 105.1 mL/min (by C-G formula based on SCr of 0.76 mg/dL). Liver Function Tests: Recent Labs  Lab 07/18/19 1002 07/22/19 0500 07/23/19 0428 07/24/19 0334  AST 30 22 24 26   ALT 33 23 21 22   ALKPHOS 80 63 59 63  BILITOT 0.5 0.6 0.5 0.6  PROT 4.8* 4.2* 4.2* 4.2*  ALBUMIN 2.4* 2.0* 2.0* 1.8*   No results for input(s): LIPASE, AMYLASE in the last 168 hours. No results for input(s): AMMONIA in the last 168 hours. Coagulation Profile: No results for input(s): INR, PROTIME in the last 168 hours. Cardiac Enzymes: No results for input(s): CKTOTAL, CKMB, CKMBINDEX, TROPONINI in the last 168 hours. BNP (last 3 results) No results for input(s): PROBNP in the last 8760 hours. HbA1C: No results for input(s): HGBA1C in the last 72 hours. CBG: No results for input(s): GLUCAP in the last 168 hours. Lipid Profile: No results for input(s): CHOL, HDL, LDLCALC, TRIG, CHOLHDL, LDLDIRECT in the last 72 hours. Thyroid Function Tests: No results for input(s): TSH, T4TOTAL, FREET4, T3FREE, THYROIDAB in the last 72 hours. Anemia Panel: No results for input(s): VITAMINB12, FOLATE, FERRITIN, TIBC, IRON, RETICCTPCT in the last 72 hours. Sepsis Labs: No results for input(s): PROCALCITON, LATICACIDVEN in the last 168 hours.  Recent Results (from the past 240 hour(s))  Respiratory Panel by PCR  Status: Abnormal   Collection Time: 07/15/19 12:36 AM  Result Value Ref Range Status   Adenovirus NOT DETECTED NOT DETECTED Final   Coronavirus 229E NOT DETECTED NOT DETECTED Final    Comment: (NOTE) The Coronavirus on  the Respiratory Panel, DOES NOT test for the novel  Coronavirus (2019 nCoV)    Coronavirus HKU1 NOT DETECTED NOT DETECTED Final   Coronavirus NL63 NOT DETECTED NOT DETECTED Final   Coronavirus OC43 NOT DETECTED NOT DETECTED Final   Metapneumovirus NOT DETECTED NOT DETECTED Final   Rhinovirus / Enterovirus DETECTED (A) NOT DETECTED Final   Influenza A NOT DETECTED NOT DETECTED Final   Influenza B NOT DETECTED NOT DETECTED Final   Parainfluenza Virus 1 NOT DETECTED NOT DETECTED Final   Parainfluenza Virus 2 NOT DETECTED NOT DETECTED Final   Parainfluenza Virus 3 NOT DETECTED NOT DETECTED Final   Parainfluenza Virus 4 NOT DETECTED NOT DETECTED Final   Respiratory Syncytial Virus NOT DETECTED NOT DETECTED Final   Bordetella pertussis NOT DETECTED NOT DETECTED Final   Chlamydophila pneumoniae NOT DETECTED NOT DETECTED Final   Mycoplasma pneumoniae NOT DETECTED NOT DETECTED Final    Comment: Performed at [facility], [place] [address], [place], [place] [address]  MRSA PCR Screening     Status: None   Collection Time: 07/22/19  5:02 PM   Specimen: Nasal Mucosa; Nasopharyngeal  Result Value Ref Range Status   MRSA by PCR NEGATIVE NEGATIVE Final    Comment:        The GeneXpert MRSA Assay (FDA approved for NASAL specimens only), is one component of a comprehensive MRSA colonization surveillance program. It is not intended to diagnose MRSA infection nor to guide or monitor treatment for MRSA infections. Performed at [facility], [place] [address] [place], [place] [address]     Radiology Studies: DG CHEST PORT 1 VIEW  Result Date: 07/22/2019 CLINICAL DATA:  Hemoptysis, history of CHF and MI EXAM: PORTABLE CHEST 1 VIEW COMPARISON:  07/17/2019 FINDINGS: RIGHT-sided PICC line terminates at the caval to atrial junction. Trachea is midline. Cardiomediastinal contours obscured by increasing basilar opacity particularly in the LEFT chest. Interstitial and airspace opacity  bilaterally. Overall similar appearance with the exception of increasing opacity at the LEFT lower lobe. Visualized skeletal structures are unremarkable. Incidental imaging of upper abdominal contents suggest bowel distension. IMPRESSION: Increasing basilar opacity at the LEFT lung base may represent worsening consolidation and or developing effusion. Interstitial and airspace disease bilaterally otherwise with similar appearance. Question of bowel distension in the upper abdomen perhaps even small bowel distension. Correlate with any worsening abdominal symptoms with dedicated abdominal imaging as warranted. These results will be called to the ordering clinician or representative by the Radiologist Assistant, and communication documented in the PACS or [employer]. Electronically Signed   By: Zetta Bills M.D.   On: 07/22/2019 16:47    Scheduled Meds: . amiodarone  200 mg Oral Daily  . budesonide (PULMICORT) nebulizer solution  0.5 mg Nebulization BID  . chlorhexidine  15 mL Mouth Rinse BID  . Chlorhexidine Gluconate Cloth  6 each Topical Daily  . clopidogrel  75 mg Oral Daily  . digoxin  0.125 mg Oral Daily  . feeding supplement (ENSURE ENLIVE)  237 mL Oral BID  . feeding supplement (PRO-STAT SUGAR FREE 64)  30 mL Oral TID BM  . furosemide  40 mg Oral Daily  . ipratropium  0.5 mg Inhalation TID  . lamoTRIgine  100 mg Oral BID  .  levalbuterol  1.25 mg Nebulization TID  . levETIRAcetam  750 mg Oral BID  . mouth rinse  15 mL Mouth Rinse q12n4p  . midodrine  5 mg Oral TID WC  . mirtazapine  15 mg Oral QHS  . multivitamin with minerals  1 tablet Oral Daily  . oxymetazoline  2 spray Each Nare BID  . pantoprazole  40 mg Oral QHS  . rosuvastatin  5 mg Oral QHS  . sertraline  25 mg Oral Daily  . sodium chloride flush  10-40 mL Intracatheter Q12H   Continuous Infusions: . sodium chloride Stopped (07/20/19 1644)  . ceFEPime (MAXIPIME) IV 2 g (07/24/19 1342)     LOS: 16 days    Time  spent: 25 mins.  Shawna Clamp, MD [facility]   If 7PM-7AM, please contact night-coverage

## 2019-07-24 NOTE — Progress Notes (Signed)
Occupational Therapy Treatment Patient Details Name: Nazaire Cordial MRN: [id number] DOB: 01-10-1956 Today's Date: 07/24/2019    History of present illness Pt to ED 2* weakness and admitted with acute hypoxemic respiratory failure, acute decompensated, systolic CHF and severe pancytopenia 2* chemo and increasing L LE edema 2* ongoing Lymphoma.  Pt with hx of MI, seizure disorder, CHF, PAF - pt underwent Cardioversion 06/22/19   OT comments  pts R knee buckled in standing. Pt sat quickly EOB.  Nursing student Aed OT scoot pt back on the bed.  Follow Up Recommendations  SNF;Home health OT;Supervision/Assistance - 24 hour (Home health vs SNF depending on progress.)    Equipment Recommendations  3 in 1 bedside commode;Hospital bed    Recommendations for Other Services      Precautions / Restrictions Precautions Precautions: Fall Precaution Comments: very frail, painful left foot. knees subject to buckle.  DROPLET       Mobility Bed Mobility Overal bed mobility: Needs Assistance Bed Mobility: Supine to Sit;Sit to Supine Rolling: Mod assist     Sit to supine: Mod assist      Transfers Overall transfer level: Needs assistance Equipment used: Rolling walker (2 wheeled) Transfers: Sit to/from Stand Sit to Stand: Mod assist;Max assist;+2 physical assistance;+2 safety/equipment         General transfer comment: pt did stand with OT and nursin student.  In standing pts R knee did buckle and pt sat quickly.  There was no warning of R knee buckling. OT did share with RN.    Balance Overall balance assessment: Needs assistance;History of Falls Sitting-balance support: Feet supported;Bilateral upper extremity supported Sitting balance-Leahy Scale: Fair     Standing balance support: Bilateral upper extremity supported;During functional activity Standing balance-Leahy Scale: Poor Standing balance comment: reliant on external support                           ADL  either performed or assessed with clinical judgement   ADL Overall ADL's : Needs assistance/impaired     Grooming: Set up;Sitting;Wash/dry face                                 General ADL Comments: pt sat EOB with OT.  Pt did agree to sit to stand. In standing pts R knee buckled and pt sat quickly EOB.     Vision Patient Visual Report: No change from baseline            Cognition Arousal/Alertness: Awake/alert Behavior During Therapy: WFL for tasks assessed/performed Overall Cognitive Status: Within Functional Limits for tasks assessed                                                     Pertinent Vitals/ Pain       Pain Assessment: No/denies pain Pain Intervention(s): Monitored during session         Frequency  Min 2X/week        Progress Toward Goals  OT Goals(current goals can now be found in the care plan section)  Progress towards OT goals: Progressing toward goals     Plan Discharge plan remains appropriate       AM-PAC OT "6 Clicks" Daily Activity     Outcome Measure  Help from another person eating meals?: None Help from another person taking care of personal grooming?: A Little Help from another person toileting, which includes using toliet, bedpan, or urinal?: A Lot Help from another person bathing (including washing, rinsing, drying)?: A Lot Help from another person to put on and taking off regular upper body clothing?: A Little Help from another person to put on and taking off regular lower body clothing?: Total 6 Click Score: 15    End of Session Equipment Utilized During Treatment: Rolling walker;Oxygen  OT Visit Diagnosis: Muscle weakness (generalized) (M62.81);Pain   Activity Tolerance Patient limited by fatigue   Patient Left in bed;with nursing/sitter in room;with call bell/phone within reach   Nurse Communication Mobility status        Time: [id number] OT Time Calculation (min): 24  min  Charges: OT General Charges $OT Visit: 1 Visit OT Treatments $Self Care/Home Management : 23-37 mins  Kari Baars, [place] [phone] Office- [place], Edwena Felty D 07/24/2019, 12:45 PM

## 2019-07-25 LAB — BASIC METABOLIC PANEL
Anion gap: 8 (ref 5–15)
BUN: 17 mg/dL (ref 8–23)
CO2: 33 mmol/L — ABNORMAL HIGH (ref 22–32)
Calcium: 7.4 mg/dL — ABNORMAL LOW (ref 8.9–10.3)
Chloride: 93 mmol/L — ABNORMAL LOW (ref 98–111)
Creatinine, Ser: 0.7 mg/dL (ref 0.61–1.24)
GFR calc Af Amer: >60 mL/min (ref >60)
GFR calc non Af Amer: >60 mL/min (ref >60)
Glucose, Bld: 81 mg/dL (ref 70–99)
Potassium: 3.3 mmol/L — ABNORMAL LOW (ref 3.5–5.1)
Sodium: 134 mmol/L — ABNORMAL LOW (ref 135–145)

## 2019-07-25 LAB — MAGNESIUM: Magnesium: 1.9 mg/dL (ref 1.7–2.4)

## 2019-07-25 LAB — CBC
HCT: 24.2 % — ABNORMAL LOW (ref 39.0–52.0)
Hemoglobin: 7.6 g/dL — ABNORMAL LOW (ref 13.0–17.0)
MCH: 28.3 pg (ref 26.0–34.0)
MCHC: 31.4 g/dL (ref 30.0–36.0)
MCV: 90 fL (ref 80.0–100.0)
Platelets: 88 10*3/uL — ABNORMAL LOW (ref 150–400)
RBC: 2.69 MIL/uL — ABNORMAL LOW (ref 4.22–5.81)
RDW: 19.7 % — ABNORMAL HIGH (ref 11.5–15.5)
WBC: 3.6 10*3/uL — ABNORMAL LOW (ref 4.0–10.5)
nRBC: 0 % (ref 0.0–0.2)

## 2019-07-25 LAB — PHOSPHORUS: Phosphorus: 2.2 mg/dL — ABNORMAL LOW (ref 2.5–4.6)

## 2019-07-25 MED ORDER — HEPARIN SOD (PORK) LOCK FLUSH 100 UNIT/ML IV SOLN
250.0000 [IU] | INTRAVENOUS | Status: AC | PRN
  Administered 2019-07-25: 250 [IU]
  Filled 2019-07-25: qty 2.5

## 2019-07-25 MED ORDER — K PHOS MONO-SOD PHOS DI & MONO 155-852-130 MG PO TABS
500.0000 mg | ORAL_TABLET | Freq: Once | ORAL | Status: AC
  Administered 2019-07-25: 500 mg via ORAL
  Filled 2019-07-25: qty 2

## 2019-07-25 MED ORDER — AMIODARONE HCL 200 MG PO TABS: 200.0000 mg | ORAL_TABLET | Freq: Every day | ORAL | 1 refills | Status: AC

## 2019-07-25 MED ORDER — FUROSEMIDE 40 MG PO TABS: 40.0000 mg | ORAL_TABLET | Freq: Every day | ORAL | 2 refills | Status: DC

## 2019-07-25 MED ORDER — CEFDINIR 300 MG PO CAPS: 300.0000 mg | ORAL_CAPSULE | Freq: Two times a day (BID) | ORAL | 0 refills | Status: AC

## 2019-07-25 MED ORDER — POTASSIUM CHLORIDE 20 MEQ PO PACK
40.0000 meq | PACK | Freq: Once | ORAL | Status: AC
  Administered 2019-07-25: 40 meq via ORAL
  Filled 2019-07-25: qty 2

## 2019-07-25 MED ORDER — BUDESONIDE 0.5 MG/2ML IN SUSP: 0.5000 mg | Freq: Two times a day (BID) | RESPIRATORY_TRACT | 12 refills | Status: AC

## 2019-07-25 NOTE — Progress Notes (Signed)
Patient wife called and stated that patient doesn't have a nebulizer machine for nebulizer medication. Informed her that I would send message to Dr. Dwyane Dee and to [place] worker to see what we can do to fix this. If she hasn't heard anything in 24 hours to give [place] a call back for an update. Secure chat sent to Dr. Dwyane Dee and Barbaraann Rondo (social worker). Dr. Dwyane Dee requested that we reach out to case manager to help.

## 2019-07-25 NOTE — Discharge Summary (Signed)
Physician Discharge Summary  Tony Saunders HYW:[id number] DOB: 31-Jul-1955 DOA: 07/08/2019  PCP: [name], [name], MD  Admit date: 07/08/2019   Discharge date: 07/25/2019  Admitted From:  Home  Disposition:  Home with home services.  Recommendations for Outpatient Follow-up:  1. Follow up with PCP in 1-2 weeks 2. Please obtain CMP/CBC in one week 3.   Advised to follow-up with cardiology as scheduled. 4.   Advised to follow-up with cancer doctor as scheduled. 5.   Advised to take [facility] for 5 more days to complete course for pneumonia. 6 .  Xarelto has been discontinued because of anemia and epistaxis.  Home Health: Yes Equipment/Devices: Yes  Discharge Condition: [place] code   Diet recommendation: [facility] course: Tony Saunders is a 64 y/o male admitted on 6/20 with three weeks of diarrhea and weakness, noted on admission to have pancytopenia and fever. 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 He was admitted in ICU, has complicated course in ICU,  ended up going into heart failure and had to be placed on a milrinone drip. Milrinone drip has been titrated off and is relatively stable, however he did test positive for rhinovirus which likely will maintain his cardiac issues. He became agitated requiring Precedex drip briefly and Levophed for hypotension.  He is off Levophed now. He is extremely deconditioned and needed to remain in the hospital for strengthening and for the PT/ OT evaluation. Patient was picked up by hospitalist service 07/21/19.  Patient had a nosebleed followed by hemoptysis.  Pulmonology consulted, chest x-ray shows worsening consolidation.  Started on cefepime,  MRSA nares negative. Xarelto was discontinued but continued on Plavix given CAD.  Patient remained stable afterwards, patient participated in physical therapy.   Hemoglobin remained stable at 7.3-7.5, discussed with cardiology, agree with discharge plan.  Patient will continue Plavix, primary care physician will decide about resumption of Xarelto, patient will follow up with cardiology as scheduled, follow-up with oncologist for resumption of chemotherapy as  scheduled.  Patient hemoglobin is 7.3,  as per cardiology at this point no need for blood transfusion.  Patient will continue [place] for 5 more days to complete 7-day course for pneumonia.  Patient reports having PICC line for chemotherapy.  Discharge Diagnoses:  Active Problems:   Acute on chronic systolic heart failure (HCC)   Pressure ulcer   Cardiogenic shock (HCC)   Acute respiratory failure with hypoxia (HCC)   Severe sepsis (HCC)   Neutropenic sepsis (HCC)   Persistent atrial fibrillation (HCC)   Symptomatic anemia  He was managed for below problems.  Acute on chronic respiratory failure multifactorial:   Improved. Causes:  rhinovirus pneumonia /Decompensated heart failure , Possible obstructive lung disease at baseline - Continue Pulmicort, Xopenex, Atrovent - Out of bed to chair as tolerated.  Acute decompensated systolic heart failure Severe mitral regurg Echo:  LVEF 35-40%  global hypokinesis, severe MR, large pleural effusion -Continue Plavix. -lasix switched to oral. -Continue midodrine -off milrinone infusion.  Hypokalemia, hypophosphatemia - Resolved. -Continue repletion.  Atrial fibrillation Continue Tele Continue digoxin, amiodarone. xarelto dc due to epistaxis, Cardiology following, discussed , hold for now, outpatient follow up.  Pancytopenia in setting of chemotherapy use for lymphoma> improving counts. No evidence of brisk bleeding but fecal occult blood test positive on stool card on admission Transfuse PRBC for Hgb < 7 gm/dL Outpatient GI work up.  Diffuse large  cell lymphoma Hold immunosuppressive cancer treatment given frailty Oncology continues to  follow -Plan is to possibly continue chemotherapy second week of July  Pressure ulcer present on admission Wound care.  Seizure disorder Continue home lamictal, keppra  Severe protein calorie malnutrition Continue regular diet + ensure/boost/prostat/MVI   DVT prophylaxis: SCDs, Xarelto on hold. Code Status: Full code Family Communication: Discussed with patient and the daughter at bedside Disposition Plan:  Home with home services.  Dispo: The patient is from:Home Anticipated d/c is to:SNF or Home with home PT Anticipated d/c date is:1-2 days Patient currently is not medically stable to d/c.   Discharge Instructions  Discharge Instructions    Call MD for:  difficulty breathing, headache or visual disturbances   Complete by: As directed    Call MD for:  persistant dizziness or light-headedness   Complete by: As directed    Call MD for:  persistant nausea and vomiting   Complete by: As directed    Call MD for:  severe uncontrolled pain   Complete by: As directed    Call MD for:  temperature >100.4   Complete by: As directed    Diet - low sodium heart healthy   Complete by: As directed    Discharge instructions   Complete by: As directed    Advised to follow-up with primary care physician in 1 week Advised to follow-up with cardiology as scheduled. Advised to follow-up with cancer doctor as scheduled. Advised to take [facility] for 5 more days to complete course for pneumonia. Xarelto has been discontinued because of anemia and epistaxis.   Discharge wound care:   Complete by: As directed    PICC line, needs care.   Increase activity slowly   Complete by: As directed      Allergies as of 07/25/2019   No Known Allergies     Medication List    STOP taking these medications   rivaroxaban 20 MG Tabs tablet Commonly known as: Xarelto   ticagrelor 90 MG Tabs tablet Commonly known as: BRILINTA     TAKE these  medications   allopurinol 300 MG tablet Commonly known as: ZYLOPRIM Take 1 tablet (300 mg total) by mouth daily.   amiodarone 200 MG tablet Commonly known as: PACERONE Take 1 tablet (200 mg total) by mouth daily. Start taking on: July 26, 2019 What changed:   how much to take  how to take this  when to take this  additional instructions   budesonide 0.5 MG/2ML nebulizer solution Commonly known as: PULMICORT Take 2 mLs (0.5 mg total) by nebulization 2 (two) times daily.   calcium carbonate 500 MG chewable tablet Commonly known as: TUMS - dosed in mg elemental calcium Chew 1 tablet (200 mg of elemental calcium total) by mouth 3 (three) times daily.   cefdinir 300 MG capsule Commonly known as: OMNICEF Take 1 capsule (300 mg total) by mouth 2 (two) times daily for 5 days.   clopidogrel 75 MG tablet Commonly known as: PLAVIX Take 1 tablet (75 mg total) by mouth daily.   digoxin 0.125 MG tablet Commonly known as: Lanoxin Take 1 tablet (125 mcg total) by mouth daily.   feeding supplement (ENSURE ENLIVE) Liqd Take 237 mLs by mouth 2 (two) times daily between meals.   furosemide 40 MG tablet Commonly known as: LASIX Take 1 tablet (40 mg total) by mouth daily. Start taking on: July 26, 2019 What changed:   medication strength  how much to take   lamoTRIgine 100  MG tablet Commonly known as: LAMICTAL Take 1 tablet (100 mg total) by mouth 2 (two) times daily.   levETIRAcetam 750 MG tablet Commonly known as: KEPPRA Take 1 tablet (750 mg total) by mouth 2 (two) times daily.   lidocaine-prilocaine cream Commonly known as: EMLA Apply 1 application topically as needed.   midodrine 5 MG tablet Commonly known as: PROAMATINE Take 1 tablet (5 mg total) by mouth 3 (three) times daily with meals.   mirtazapine 15 MG disintegrating tablet Commonly known as: REMERON SOL-TAB Take 1 tablet (15 mg total) by mouth at bedtime.   nitroGLYCERIN 0.4 MG SL tablet Commonly known  as: NITROSTAT Place 1 tablet (0.4 mg total) under the tongue every 5 (five) minutes as needed for chest pain.   ondansetron 4 MG tablet Commonly known as: ZOFRAN Take 1 tablet (4 mg total) by mouth every 6 (six) hours as needed for nausea.   pantoprazole 40 MG tablet Commonly known as: PROTONIX Take 1 tablet (40 mg total) by mouth daily.   potassium chloride SA 20 MEQ tablet Commonly known as: KLOR-CON Take 1 tablet (20 mEq total) by mouth daily.   predniSONE 50 MG tablet Commonly known as: DELTASONE Take 2 tablets for 5 days every 21 days with chemotherapy.   prochlorperazine 10 MG tablet Commonly known as: COMPAZINE Take 1 tablet (10 mg total) by mouth every 6 (six) hours as needed for nausea or vomiting.   rosuvastatin 5 MG tablet Commonly known as: CRESTOR Take 1 tablet (5 mg total) by mouth daily. What changed: when to take this   sertraline 25 MG tablet Commonly known as: ZOLOFT Take 1 tablet (25 mg total) by mouth daily.            Durable Medical Equipment  (From admission, onward)         Start     Ordered   07/25/19 1117  For home use only DME 3 n 1  Once        07/25/19 1117           Discharge Care Instructions  (From admission, onward)         Start     Ordered   07/25/19 0000  Discharge wound care:       Comments: PICC line, needs care.   07/25/19 1441          Follow-up Information    [facility] Follow up.   Specialty: [place] Why: This is one of the home health agencies that you are in network with Contact information: [address] [place] [address] [phone]        [facility] Follow up.   Why: This is one of the home healeth agencies that you are in network with Contact information: [place] [address] [phone]        [name], [name], MD Follow up in 1 week(s).   Specialty: Internal Medicine Contact  information: [place] [address] [phone]        Leonie Man, MD .   Specialty: Cardiology Contact information: [address] [place] [address] [phone]        Wyatt Portela, MD Follow up in 1 week(s).   Specialty: Oncology Contact information: [place] [address] [phone]              No Known Allergies  Consultations:  Pulmonology, cardiology, oncology, critical care   Procedures/Studies: DG CHEST PORT 1 VIEW  Result Date: 07/22/2019 CLINICAL DATA:  Hemoptysis, history of CHF and MI EXAM: PORTABLE CHEST 1 VIEW COMPARISON:  07/17/2019 FINDINGS: RIGHT-sided PICC line terminates at the caval to atrial junction. Trachea is midline. Cardiomediastinal contours obscured by increasing basilar opacity particularly in the LEFT chest. Interstitial and airspace opacity bilaterally. Overall similar appearance with the exception of increasing opacity at the LEFT lower lobe. Visualized skeletal structures are unremarkable. Incidental imaging of upper abdominal contents suggest bowel distension. IMPRESSION: Increasing basilar opacity at the LEFT lung base may represent worsening consolidation and or developing effusion. Interstitial and airspace disease bilaterally otherwise with similar appearance. Question of bowel distension in the upper abdomen perhaps even small bowel distension. Correlate with any worsening abdominal symptoms with dedicated abdominal imaging as warranted. These results will be called to the ordering clinician or representative by the Radiologist Assistant, and communication documented in the PACS or [employer]. Electronically Signed   By: Zetta Bills M.D.   On: 07/22/2019 16:47   DG CHEST PORT 1 VIEW  Result Date: 07/17/2019 CLINICAL DATA:  Acute on chronic congestive heart failure. Respiratory failure. EXAM: PORTABLE CHEST 1 VIEW COMPARISON:  Single-view of the chest  07/14/2019 and 07/13/2019. FINDINGS: Right PICC remains in place. Extensive bilateral airspace disease persist which shows mild improvement. There are small to moderate pleural effusions, greater on the right which also appears somewhat improved. No pneumothorax. Heart size is normal. IMPRESSION: Right greater than left pleural effusions and airspace disease persist but appear mildly improved. Electronically Signed   By: Inge Rise M.D.   On: 07/17/2019 11:00   DG CHEST PORT 1 VIEW  Result Date: 07/14/2019 CLINICAL DATA:  Respiratory failure EXAM: PORTABLE CHEST 1 VIEW COMPARISON:  July 13, 2019 FINDINGS: A right PICC line terminates in the central SVC. No pneumothorax. Diffuse bilateral pulmonary infiltrates are similar in the interval. No change in the cardiomediastinal silhouette. IMPRESSION: Bilateral pulmonary infiltrates are similar to slightly improved in the interval. Recommend follow-up to resolution. No other acute abnormalities or changes. Electronically Signed   By: Dorise Bullion III M.D   On: 07/14/2019 06:23   DG CHEST PORT 1 VIEW  Result Date: 07/13/2019 CLINICAL DATA:  Respiratory failure EXAM: PORTABLE CHEST 1 VIEW COMPARISON:  Radiograph 06/18/2019 FINDINGS: Right upper extremity PICC tip terminates at the superior cavoatrial junction. Telemetry leads overlie the chest. Coronary stent projects over the heart. Persistent bilateral dense heterogeneous airspace opacities are slightly improved in the right lower lung though essentially unchanged elsewhere. No pneumothorax. Small left and likely moderate right pleural effusion. No pneumothorax. Unchanged cardiomediastinal contours. No acute osseous or soft tissue abnormality. Degenerative changes are present in the imaged spine and shoulders. IMPRESSION: Persistent bilateral dense heterogeneous airspace opacities, slightly improved in the right lower lung though essentially unchanged elsewhere. Findings could reflect infection or edema.  Electronically Signed   By: Lovena Le M.D.   On: 07/13/2019 05:33   DG CHEST PORT 1 VIEW  Result Date: 07/11/2019 CLINICAL DATA:  Respiratory failure. EXAM: PORTABLE CHEST 1 VIEW COMPARISON:  Single-view of the chest 07/08/2019. FINDINGS: Right PICC is unchanged. Diffuse bilateral airspace disease is much worse than on the prior study. There are likely small to moderate bilateral pleural effusions. No pneumothorax. Heart size is normal. IMPRESSION: Marked worsening diffuse bilateral airspace disease consistent with progressive pneumonia and/or pulmonary edema. Electronically Signed   By: Inge Rise M.D.   On: 07/11/2019 08:55  DG Chest Port 1 View  Result Date: 07/08/2019 CLINICAL DATA:  Fever and possible sepsis. EXAM: PORTABLE CHEST 1 VIEW COMPARISON:  06/24/2019 FINDINGS: Right-sided PICC line unchanged. Lungs are adequately inflated with hazy opacification over the perihilar regions and lung bases as well as right upper lung. Changes over the right upper lung are new/worse. Small to moderate left pleural effusion likely with associated basilar atelectasis slightly worse. Cardiomediastinal silhouette and remainder the exam is unchanged. IMPRESSION: 1. Slight worsening bilateral multifocal airspace process likely due to infection. 2. Possible component of interstitial edema. Worsening moderate size left effusion likely with associated basilar atelectasis. Electronically Signed   By: Marin Olp M.D.   On: 07/08/2019 14:34   VAS [place] LOWER EXTREMITY ARTERIAL DUPLEX  Result Date: 06/29/2019 LOWER EXTREMITY ARTERIAL DUPLEX STUDY Indications: Left leg cooler than right. 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  Current ABI: Not obtained secondary to significant swelling Comparison Study: No prior study Performing Technologist: Maudry Mayhew MHA, RDMS, RVT, RDCS  Examination Guidelines: A complete evaluation includes B-mode imaging, spectral Doppler,  color Doppler, and power Doppler as needed of all accessible portions of each vessel. Bilateral testing is considered an integral part of a complete examination. Limited examinations for reoccurring indications may be performed as noted.  +-----------+--------+-----+--------+---------+--------+ LEFT       PSV cm/sRatioStenosisWaveform Comments +-----------+--------+-----+--------+---------+--------+ CFA Distal 55                   triphasic         +-----------+--------+-----+--------+---------+--------+ DFA        36                   triphasic         +-----------+--------+-----+--------+---------+--------+ SFA Prox   58                   triphasic         +-----------+--------+-----+--------+---------+--------+ SFA Mid    72                   triphasic         +-----------+--------+-----+--------+---------+--------+ SFA Distal 49                   triphasic         +-----------+--------+-----+--------+---------+--------+ POP Distal 32                   triphasic         +-----------+--------+-----+--------+---------+--------+ ATA Distal 24                   triphasic         +-----------+--------+-----+--------+---------+--------+ PTA Distal 40                   triphasic         +-----------+--------+-----+--------+---------+--------+ PERO Distal19                   triphasic         +-----------+--------+-----+--------+---------+--------+ DP         18                   triphasic         +-----------+--------+-----+--------+---------+--------+  Summary: Left: Near normal examination of left lower extremityi arteries with minimal intimal thickening or plaque.  See table(s) above for measurements and observations. Electronically signed by Monica Martinez MD on 06/29/2019 at 8:39:51 PM.  Final        Subjective: Patient was seen and examined at bedside.  He is excited to go home.  Denies any pain, participated in physical  therapy.  He reports getting chemotherapy through PICC line.  He was admitted with a PICC line and is being discharged with a PICC line.  Discharge Exam: Vitals:   07/25/19 1240 07/25/19 1403  BP: 103/63 (!) 92/57  Pulse: 71 69  Resp:  20  Temp:  97.6 F (36.4 C)  SpO2: (!) 77% 93%   Vitals:   07/25/19 0757 07/25/19 0802 07/25/19 1240 07/25/19 1403  BP:  101/62 103/63 (!) 92/57  Pulse:  68 71 69  Resp:    20  Temp:    97.6 F (36.4 C)  TempSrc:    Oral  SpO2: 96% 100% (!) 77% 93%  Weight:      Height:        General: Pt is alert, awake, not in acute distress Cardiovascular: RRR, S1/S2 +, no rubs, no gallops Respiratory: CTA bilaterally, no wheezing, no rhonchi Abdominal: Soft, NT, ND, bowel sounds + Extremities: no edema, no cyanosis    The results of significant diagnostics from this hospitalization (including imaging, microbiology, ancillary and laboratory) are listed below for reference.     Microbiology: Recent Results (from the past 240 hour(s))  MRSA PCR Screening     Status: None   Collection Time: 07/22/19  5:02 PM   Specimen: Nasal Mucosa; Nasopharyngeal  Result Value Ref Range Status   MRSA by PCR NEGATIVE NEGATIVE Final    Comment:        The GeneXpert MRSA Assay (FDA approved for NASAL specimens only), is one component of a comprehensive MRSA colonization surveillance program. It is not intended to diagnose MRSA infection nor to guide or monitor treatment for MRSA infections. Performed at [facility], [place] [address], [place], [place] [address]      Labs: BNP (last 3 results) Recent Labs    07/13/19 0200 07/14/19 0500 07/16/19 0629  BNP 608.0* 608.8* 381.8*   Basic Metabolic Panel: Recent Labs  Lab 07/21/19 0356 07/22/19 0500 07/23/19 0428 07/24/19 0334 07/25/19 0426  NA 134* 136 133* 136 134*  K 4.0 4.0 3.2* 3.6 3.3*  CL 92* 92* 91* 93* 93*  CO2 33* 33* 35* 33* 33*  GLUCOSE 77 80 76 83 81  BUN 18 18 19 18  17   CREATININE 0.81 0.89 0.76 0.76 0.70  CALCIUM 7.5* 7.4* 7.4* 7.5* 7.4*  MG 1.9 1.8 1.8 1.8 1.9  PHOS  --  2.4* 2.8 2.2* 2.2*   Liver Function Tests: Recent Labs  Lab 07/22/19 0500 07/23/19 0428 07/24/19 0334  AST 22 24 26   ALT 23 21 22   ALKPHOS 63 59 63  BILITOT 0.6 0.5 0.6  PROT 4.2* 4.2* 4.2*  ALBUMIN 2.0* 2.0* 1.8*   No results for input(s): LIPASE, AMYLASE in the last 168 hours. No results for input(s): AMMONIA in the last 168 hours. CBC: Recent Labs  Lab 07/22/19 0500 07/22/19 1213 07/23/19 0428 07/24/19 0334 07/25/19 0426  WBC 5.1 4.6 4.3 4.0 3.6*  HGB 7.0* 7.3* 8.0* 7.9* 7.6*  HCT 22.5* 23.2* 25.1* 24.7* 24.2*  MCV 91.5 92.4 89.0 89.2 90.0  PLT 110* 107* 106* 101* 88*   Cardiac Enzymes: No results for input(s): CKTOTAL, CKMB, CKMBINDEX, TROPONINI in the last 168 hours. BNP: Invalid input(s): POCBNP CBG: No results for input(s): GLUCAP in the last 168 hours. D-Dimer No results  for input(s): DDIMER in the last 72 hours. Hgb A1c No results for input(s): HGBA1C in the last 72 hours. Lipid Profile No results for input(s): CHOL, HDL, LDLCALC, TRIG, CHOLHDL, LDLDIRECT in the last 72 hours. Thyroid function studies No results for input(s): TSH, T4TOTAL, T3FREE, THYROIDAB in the last 72 hours.  Invalid input(s): FREET3 Anemia work up No results for input(s): VITAMINB12, FOLATE, FERRITIN, TIBC, IRON, RETICCTPCT in the last 72 hours. Urinalysis    Component Value Date/Time   COLORURINE YELLOW 06/24/2019 1346   APPEARANCEUR HAZY (A) 06/24/2019 1346   LABSPEC 1.021 06/24/2019 1346   PHURINE 5.0 06/24/2019 1346   GLUCOSEU NEGATIVE 06/24/2019 1346   HGBUR SMALL (A) 06/24/2019 1346   BILIRUBINUR NEGATIVE 06/24/2019 1346   KETONESUR NEGATIVE 06/24/2019 1346   PROTEINUR NEGATIVE 06/24/2019 1346   NITRITE NEGATIVE 06/24/2019 1346   LEUKOCYTESUR NEGATIVE 06/24/2019 1346   Sepsis Labs Invalid input(s): PROCALCITONIN,  WBC,  LACTICIDVEN Microbiology Recent  Results (from the past 240 hour(s))  MRSA PCR Screening     Status: None   Collection Time: 07/22/19  5:02 PM   Specimen: Nasal Mucosa; Nasopharyngeal  Result Value Ref Range Status   MRSA by PCR NEGATIVE NEGATIVE Final    Comment:        The GeneXpert MRSA Assay (FDA approved for NASAL specimens only), is one component of a comprehensive MRSA colonization surveillance program. It is not intended to diagnose MRSA infection nor to guide or monitor treatment for MRSA infections. Performed at [facility], [place] [address] [place], [place] [address]      Time coordinating discharge: Over 30 minutes  SIGNED:   Shawna Clamp, MD  [facility] 07/25/2019, 2:42 PM   If 7PM-7AM, please contact night-coverage [email or web address]

## 2019-07-25 NOTE — Discharge Instructions (Signed)
Advised to follow-up with primary care physician in 1 week Advised to follow-up with cardiology as scheduled. Advised to follow-up with cancer doctor as scheduled. Advised to take [facility] for 5 more days to complete course for pneumonia. Xarelto has been discontinued because of anemia and epistaxis.

## 2019-07-25 NOTE — Progress Notes (Signed)
Physical Therapy Treatment Patient Details Name: Tony Saunders MRN: [id number] DOB: February 13, 1955 Today's Date: 07/25/2019    History of Present Illness Pt to ED 2* weakness and admitted with acute hypoxemic respiratory failure, acute decompensated, systolic CHF and severe pancytopenia 2* chemo and increasing L LE edema 2* ongoing Lymphoma.  Pt with hx of MI, seizure disorder, CHF, PAF - pt underwent Cardioversion 06/22/19    PT Comments    Pt only tolerated OOB to recliner a little over an hour.  Assisted back to bed.  General transfer comment: simulating how pt's Son's assist I had pt pull himself up with his arms "around" my neck.  Pt required MAX assist to achieve a full upright posture but was able to "lock" B knees.  Once knees were "locked" pt was able to support himself with only Min Assist.  Keeping knees "locked" pt was able to complete 1/4 pivot turn to bed.  Once pt attempted to sit on bed his knees did buckle an pt sat uncontrolled EOB.  Then required an extended rest break before attempting to lift B LE up onto bed. General bed mobility comments: pt required increased assist back to bed to support B LE up onto bed as pt was unable to self perform. Pt will need 24/7 care at home esp with mobility   Follow Up Recommendations  Home health PT;Supervision/Assistance - 24 hour;Outpatient PT     Equipment Recommendations  3in1 (PT)    Recommendations for Other Services       Precautions / Restrictions Precautions Precautions: Fall Precaution Comments: very frail, painful left foot with edema. knees subject to buckle, VERY weak Quads    Mobility  Bed Mobility Overal bed mobility: Needs Assistance Bed Mobility: Sit to Supine     Sit to supine: Mod assist   General bed mobility comments: pt required increased assist back to bed to support B LE up onto bed as pt was unable to self perform.  Transfers Overall transfer level: Needs assistance Equipment used:  None Transfers: Stand Pivot Transfers   Stand pivot transfers: Max assist     General transfer comment: simulating how pt's Son's assist I had pt pull himself up with his arms "around" my neck.  Pt required MAX assist to achieve a full upright posture but was able to "lock" B knees.  Once knees were "locked" pt was able to support himself with only Min Assist.  Keeping knees "locked" pt was able to complete 1/4 pivot turn to bed.  Once pt attempted to sit on bed his knees did buckle an pt sat uncontrolled EOB.  Then required an extended rest break before attempting to lift B LE up onto bed.  Ambulation/Gait             General Gait Details: transfers only this session due to profound weakness   Stairs             Wheelchair Mobility    Modified Rankin (Stroke Patients Only)       Balance                                            Cognition Arousal/Alertness: Awake/alert Behavior During Therapy: WFL for tasks assessed/performed Overall Cognitive Status: Within Functional Limits for tasks assessed  General Comments: AxO x 4 very pleasant      Exercises      General Comments        Pertinent Vitals/Pain Pain Assessment: Faces Faces Pain Scale: Hurts a little bit Pain Location: general discomfort with mobilizing Pain Descriptors / Indicators: Discomfort;Grimacing Pain Intervention(s): Monitored during session;Repositioned    Home Living                      Prior Function            PT Goals (current goals can now be found in the care plan section) Progress towards PT goals: Progressing toward goals    Frequency    Min 3X/week      PT Plan      Co-evaluation              AM-PAC PT "6 Clicks" Mobility   Outcome Measure  Help needed turning from your back to your side while in a flat bed without using bedrails?: A Little Help needed moving from lying on your  back to sitting on the side of a flat bed without using bedrails?: A Little Help needed moving to and from a bed to a chair (including a wheelchair)?: A Little Help needed standing up from a chair using your arms (e.g., wheelchair or bedside chair)?: A Lot Help needed to walk in hospital room?: A Lot Help needed climbing 3-5 steps with a railing? : Total 6 Click Score: 14    End of Session Equipment Utilized During Treatment: Gait belt Activity Tolerance: Patient limited by fatigue Patient left: in chair;with call bell/phone within reach Nurse Communication: Mobility status (lowest RA was 92%) PT Visit Diagnosis: Difficulty in walking, not elsewhere classified (R26.2);Muscle weakness (generalized) (M62.81)     Time: 1255-1310 PT Time Calculation (min) (ACUTE ONLY): 15 min  Charges:   $Therapeutic Activity: 8-22 mins                     Rica Koyanagi  PTA Acute  Rehabilitation Services Pager      [phone] Office      [phone]

## 2019-07-25 NOTE — Progress Notes (Signed)
Went over discharge instructions w/ patient and wife. They agreed w/ plan.

## 2019-07-25 NOTE — TOC Transition Note (Signed)
Transition of Care [facility]) - CM/SW Discharge Note   Patient Details  Name: Tony Saunders MRN: [id number] Date of Birth: 1955/11/11  [facility] CM/SW Contact:  Trish Mage, LCSW Phone Number: 07/25/2019, 3:20 PM   Clinical Narrative:  Patient to d/c today.  Per wife request, got a signed script for lift chair and 3 in 1 from [facility].  2 in network [facility] providers are [facility] and [facility].  Neither had capacity to provide services today.  I gave wife their contact names and numbers on AVS, and suggested she call daily while also letting her know she will need PCP order when availability is identified. Wife is transporting patient home.  TOC sign off.     Final next level of care: Home/Self Care Barriers to Discharge: Barriers Resolved   Patient Goals and CMS Choice Patient states their goals for this hospitalization and ongoing recovery are:: to go home CMS Medicare.gov Compare Post Acute Care list provided to:: Patient Choice offered to / list presented to : Patient  Discharge Placement                       Discharge Plan and Services   Discharge Planning Services: CM Consult                                 Social Determinants of Health (SDOH) Interventions     Readmission Risk Interventions Readmission Risk Prevention Plan 07/23/2019  Transportation Screening Complete  Medication Review [employer]) Complete  HRI or [place] Complete  SW Recovery Care/Counseling Consult Complete  Palliative Care Screening Not [place] Patient Refused  Some recent data might be hidden

## 2019-07-25 NOTE — Progress Notes (Signed)
BP 95/56, patient showing no signs and symptoms of distress. Will continue to monitor the patient.

## 2019-07-25 NOTE — Progress Notes (Signed)
Physical Therapy Treatment Patient Details Name: Tony Saunders MRN: [id number] DOB: 1955/08/04 Today's Date: 07/25/2019    History of Present Illness Pt to ED 2* weakness and admitted with acute hypoxemic respiratory failure, acute decompensated, systolic CHF and severe pancytopenia 2* chemo and increasing L LE edema 2* ongoing Lymphoma.  Pt with hx of MI, seizure disorder, CHF, PAF - pt underwent Cardioversion 06/22/19    PT Comments    Pt in bed on 2 lts nasal at 97%.  Trial removed with lowest RA at 92%.  Pt does not qualify for supplemental oxygen.  Pt plans to D/C to home today.  Pt is VERY weak.  He stated he wants to be able to get OOB to his wheelchair by himself as his spouse is unable to lift much. General bed mobility comments: with increased time and segmental mvts with multiple rest breaks pt was able to assist self to EOB.  Required an extended rest break.    Lateral/Scoot Transfers: Min assist;Mod assist.  Required an extended rest break.  Demonstrated and instructed on some LE strengthening TE's.  Pt has VERY weak QUADS.  Sit to stand is very difficult.  Pt stated he has to "lock his knees" otherwise they buckle.  Pt positioned in recliner but asked for me to return in about an hour to go back to bed.  "I can't stay up long".    Follow Up Recommendations  Home health PT;Supervision/Assistance - 24 hour;Outpatient PT     Equipment Recommendations  3in1 (PT)    Recommendations for Other Services       Precautions / Restrictions Precautions Precautions: Fall Precaution Comments: very frail, painful left foot with edema. knees subject to buckle, VERY weak Quads    Mobility  Bed Mobility Overal bed mobility: Needs Assistance Bed Mobility: Supine to Sit     Supine to sit: Min guard;Min assist     General bed mobility comments: with increased time and segmental mvts with multiple rest breaks pt was able to assist self to EOB.  VERY weak.  Transfers Overall transfer  level: Needs assistance Equipment used: None Transfers: Lateral/Scoot Transfers          Lateral/Scoot Transfers: Min assist;Mod assist General transfer comment: demonstarted and instructed on lateral scoot from elevated bed to drop arm recliner (simulating his wheelchair) pt was able to segmental mvts and rest breaks between.  Pt wants to be able to get self in/out of his wheelchair on his own.  Stated his spouse is NOT able to "lift much" and his Son lets hiwm grab him around the neck to get to a standing position.  Ambulation/Gait             General Gait Details: transfers only this session   Stairs             Wheelchair Mobility    Modified Rankin (Stroke Patients Only)       Balance                                            Cognition Arousal/Alertness: Awake/alert Behavior During Therapy: WFL for tasks assessed/performed Overall Cognitive Status: Within Functional Limits for tasks assessed                                 General  Comments: AxO x 4 very pleasant      Exercises      General Comments        Pertinent Vitals/Pain Pain Assessment: Faces Faces Pain Scale: Hurts a little bit Pain Location: general discomfort with mobilizing Pain Descriptors / Indicators: Discomfort;Grimacing Pain Intervention(s): Monitored during session;Repositioned    Home Living                      Prior Function            PT Goals (current goals can now be found in the care plan section) Progress towards PT goals: Progressing toward goals    Frequency    Min 3X/week      PT Plan      Co-evaluation              AM-PAC PT "6 Clicks" Mobility   Outcome Measure  Help needed turning from your back to your side while in a flat bed without using bedrails?: A Little Help needed moving from lying on your back to sitting on the side of a flat bed without using bedrails?: A Little Help needed moving to  and from a bed to a chair (including a wheelchair)?: A Little Help needed standing up from a chair using your arms (e.g., wheelchair or bedside chair)?: A Lot Help needed to walk in hospital room?: A Lot Help needed climbing 3-5 steps with a railing? : Total 6 Click Score: 14    End of Session Equipment Utilized During Treatment: Gait belt Activity Tolerance: Patient limited by fatigue Patient left: in chair;with call bell/phone within reach Nurse Communication: Mobility status (lowest RA was 92%) PT Visit Diagnosis: Difficulty in walking, not elsewhere classified (R26.2);Muscle weakness (generalized) (M62.81)     Time: 7121-9758 PT Time Calculation (min) (ACUTE ONLY): 25 min  Charges:  $Therapeutic Exercise: 8-22 mins $Therapeutic Activity: 8-22 mins                     Rica Koyanagi  PTA Acute  Rehabilitation Services Pager      [phone] Office      [phone]

## 2019-07-26 ENCOUNTER — Telehealth: Payer: Self-pay | Admitting: *Deleted

## 2019-07-26 NOTE — Telephone Encounter (Signed)
Transition Care Management Follow-up Telephone Call   Date discharged? 07/25/2019   How have you been since you were released from the hospital? He is really tired per wife    Do you understand why you were in the hospital? yes   Do you understand the discharge instructions? yes   Where were you discharged to? Home    Items Reviewed:  Medications reviewed: yes  Allergies reviewed: NKA  Dietary changes reviewed: N/A  Referrals reviewed: yes   Functional Questionnaire:   Activities of Daily Living (ADLs):   He states they are independent in the following: feeding and continence States they require assistance with the following: ambulation, bathing and hygiene, grooming, toileting and dressing   Any transportation issues/concerns?: no   Any patient concerns? no   Confirmed importance and date/time of follow-up visits scheduled yes  Provider Appointment booked with  Dr. Jerilee Hoh 07/31/2019 at 1030  Confirmed with patient if condition begins to worsen call PCP or go to the ER.  Patient was given the office number and encouraged to call back with question or concerns.  : yes

## 2019-07-27 ENCOUNTER — Other Ambulatory Visit: Payer: Self-pay | Admitting: Oncology

## 2019-07-30 ENCOUNTER — Inpatient Hospital Stay: Payer: No Typology Code available for payment source

## 2019-07-30 ENCOUNTER — Other Ambulatory Visit: Payer: Self-pay

## 2019-07-30 ENCOUNTER — Inpatient Hospital Stay (HOSPITAL_BASED_OUTPATIENT_CLINIC_OR_DEPARTMENT_OTHER): Payer: No Typology Code available for payment source | Admitting: Oncology

## 2019-07-30 ENCOUNTER — Inpatient Hospital Stay: Payer: No Typology Code available for payment source | Attending: Oncology

## 2019-07-30 VITALS — BP 111/78 | HR 92 | Temp 97.7°F | Resp 22

## 2019-07-30 VITALS — BP 106/76 | HR 88 | Temp 98.5°F | Resp 20

## 2019-07-30 DIAGNOSIS — Z95828 Presence of other vascular implants and grafts: Secondary | ICD-10-CM | POA: Insufficient documentation

## 2019-07-30 DIAGNOSIS — Z5111 Encounter for antineoplastic chemotherapy: Secondary | ICD-10-CM | POA: Diagnosis present

## 2019-07-30 DIAGNOSIS — Z5112 Encounter for antineoplastic immunotherapy: Secondary | ICD-10-CM | POA: Diagnosis not present

## 2019-07-30 DIAGNOSIS — I509 Heart failure, unspecified: Secondary | ICD-10-CM | POA: Diagnosis not present

## 2019-07-30 DIAGNOSIS — R599 Enlarged lymph nodes, unspecified: Secondary | ICD-10-CM | POA: Insufficient documentation

## 2019-07-30 DIAGNOSIS — C8335 Diffuse large B-cell lymphoma, lymph nodes of inguinal region and lower limb: Secondary | ICD-10-CM

## 2019-07-30 DIAGNOSIS — Z7901 Long term (current) use of anticoagulants: Secondary | ICD-10-CM | POA: Insufficient documentation

## 2019-07-30 DIAGNOSIS — C8299 Follicular lymphoma, unspecified, extranodal and solid organ sites: Secondary | ICD-10-CM | POA: Diagnosis not present

## 2019-07-30 DIAGNOSIS — Z79899 Other long term (current) drug therapy: Secondary | ICD-10-CM | POA: Diagnosis not present

## 2019-07-30 DIAGNOSIS — Z5189 Encounter for other specified aftercare: Secondary | ICD-10-CM | POA: Insufficient documentation

## 2019-07-30 DIAGNOSIS — C8330 Diffuse large B-cell lymphoma, unspecified site: Secondary | ICD-10-CM | POA: Diagnosis present

## 2019-07-30 DIAGNOSIS — I4891 Unspecified atrial fibrillation: Secondary | ICD-10-CM | POA: Diagnosis not present

## 2019-07-30 DIAGNOSIS — R570 Cardiogenic shock: Secondary | ICD-10-CM | POA: Insufficient documentation

## 2019-07-30 LAB — CMP (CANCER CENTER ONLY)
ALT: 22 U/L (ref 0–44)
AST: 24 U/L (ref 15–41)
Albumin: 2.2 g/dL — ABNORMAL LOW (ref 3.5–5.0)
Alkaline Phosphatase: 84 U/L (ref 38–126)
Anion gap: 10 (ref 5–15)
BUN: 12 mg/dL (ref 8–23)
CO2: 30 mmol/L (ref 22–32)
Calcium: 8.3 mg/dL — ABNORMAL LOW (ref 8.9–10.3)
Chloride: 100 mmol/L (ref 98–111)
Creatinine: 0.72 mg/dL (ref 0.61–1.24)
GFR, Est AFR Am: >60 mL/min (ref >60)
GFR, Estimated: >60 mL/min (ref >60)
Glucose, Bld: 85 mg/dL (ref 70–99)
Potassium: 4.2 mmol/L (ref 3.5–5.1)
Sodium: 140 mmol/L (ref 135–145)
Total Bilirubin: 0.7 mg/dL (ref 0.3–1.2)
Total Protein: 4.7 g/dL — ABNORMAL LOW (ref 6.5–8.1)

## 2019-07-30 LAB — CBC WITH DIFFERENTIAL (CANCER CENTER ONLY)
Abs Immature Granulocytes: 0.06 10*3/uL (ref 0.00–0.07)
Basophils Absolute: 0 10*3/uL (ref 0.0–0.1)
Basophils Relative: 0 %
Eosinophils Absolute: 0 10*3/uL (ref 0.0–0.5)
Eosinophils Relative: 1 %
HCT: 27.8 % — ABNORMAL LOW (ref 39.0–52.0)
Hemoglobin: 8.7 g/dL — ABNORMAL LOW (ref 13.0–17.0)
Immature Granulocytes: 1 %
Lymphocytes Relative: 8 %
Lymphs Abs: 0.4 10*3/uL — ABNORMAL LOW (ref 0.7–4.0)
MCH: 28.2 pg (ref 26.0–34.0)
MCHC: 31.3 g/dL (ref 30.0–36.0)
MCV: 90.3 fL (ref 80.0–100.0)
Monocytes Absolute: 0.4 10*3/uL (ref 0.1–1.0)
Monocytes Relative: 7 %
Neutro Abs: 4.8 10*3/uL (ref 1.7–7.7)
Neutrophils Relative %: 83 %
Platelet Count: 109 10*3/uL — ABNORMAL LOW (ref 150–400)
RBC: 3.08 MIL/uL — ABNORMAL LOW (ref 4.22–5.81)
RDW: 20.5 % — ABNORMAL HIGH (ref 11.5–15.5)
WBC Count: 5.7 10*3/uL (ref 4.0–10.5)
nRBC: 0 % (ref 0.0–0.2)

## 2019-07-30 MED ORDER — ACETAMINOPHEN 325 MG PO TABS
650.0000 mg | ORAL_TABLET | Freq: Once | ORAL | Status: AC
  Administered 2019-07-30: 650 mg via ORAL

## 2019-07-30 MED ORDER — SODIUM CHLORIDE 0.9 % IV SOLN
Freq: Once | INTRAVENOUS | Status: AC
  Filled 2019-07-30: qty 250

## 2019-07-30 MED ORDER — SODIUM CHLORIDE 0.9 % IV SOLN
500.0000 mg/m2 | Freq: Once | INTRAVENOUS | Status: AC
  Administered 2019-07-30: 1020 mg via INTRAVENOUS
  Filled 2019-07-30: qty 51

## 2019-07-30 MED ORDER — PALONOSETRON HCL INJECTION 0.25 MG/5ML
0.2500 mg | Freq: Once | INTRAVENOUS | Status: AC
  Administered 2019-07-30: 0.25 mg via INTRAVENOUS

## 2019-07-30 MED ORDER — SODIUM CHLORIDE 0.9 % IV SOLN
375.0000 mg/m2 | Freq: Once | INTRAVENOUS | Status: AC
  Administered 2019-07-30: 800 mg via INTRAVENOUS
  Filled 2019-07-30: qty 50

## 2019-07-30 MED ORDER — PALONOSETRON HCL INJECTION 0.25 MG/5ML
INTRAVENOUS | Status: AC
  Filled 2019-07-30: qty 5

## 2019-07-30 MED ORDER — SODIUM CHLORIDE 0.9 % IV SOLN
10.0000 mg | Freq: Once | INTRAVENOUS | Status: AC
  Administered 2019-07-30: 10 mg via INTRAVENOUS
  Filled 2019-07-30: qty 10

## 2019-07-30 MED ORDER — SODIUM CHLORIDE 0.9% FLUSH
10.0000 mL | Freq: Once | INTRAVENOUS | Status: AC
  Administered 2019-07-30: 10 mL
  Filled 2019-07-30: qty 10

## 2019-07-30 MED ORDER — ACETAMINOPHEN 325 MG PO TABS
ORAL_TABLET | ORAL | Status: AC
  Filled 2019-07-30: qty 2

## 2019-07-30 MED ORDER — SODIUM CHLORIDE 0.9% FLUSH
10.0000 mL | INTRAVENOUS | Status: DC | PRN
  Administered 2019-07-30: 10 mL
  Filled 2019-07-30: qty 10

## 2019-07-30 MED ORDER — HEPARIN SOD (PORK) LOCK FLUSH 100 UNIT/ML IV SOLN
250.0000 [IU] | Freq: Once | INTRAVENOUS | Status: AC | PRN
  Administered 2019-07-30: 250 [IU]
  Filled 2019-07-30: qty 5

## 2019-07-30 MED ORDER — SODIUM CHLORIDE 0.9 % IV SOLN
150.0000 mg | Freq: Once | INTRAVENOUS | Status: AC
  Administered 2019-07-30: 150 mg via INTRAVENOUS
  Filled 2019-07-30: qty 150

## 2019-07-30 MED ORDER — DIPHENHYDRAMINE HCL 25 MG PO CAPS
ORAL_CAPSULE | ORAL | Status: AC
  Filled 2019-07-30: qty 2

## 2019-07-30 MED ORDER — DIPHENHYDRAMINE HCL 25 MG PO CAPS
50.0000 mg | ORAL_CAPSULE | Freq: Once | ORAL | Status: AC
  Administered 2019-07-30: 50 mg via ORAL

## 2019-07-30 MED ORDER — DOXORUBICIN HCL CHEMO IV INJECTION 2 MG/ML
25.0000 mg/m2 | Freq: Once | INTRAVENOUS | Status: AC
  Administered 2019-07-30: 52 mg via INTRAVENOUS
  Filled 2019-07-30: qty 26

## 2019-07-30 MED ORDER — VINCRISTINE SULFATE CHEMO INJECTION 1 MG/ML
1.0000 mg | Freq: Once | INTRAVENOUS | Status: AC
  Administered 2019-07-30: 1 mg via INTRAVENOUS
  Filled 2019-07-30: qty 1

## 2019-07-30 NOTE — Progress Notes (Signed)
Hematology and Oncology Follow Up Visit  Tony Saunders [id number] 18-Oct-1955 64 y.o. 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   Prior Therapy:  He is status post ultrasound guided core biopsy of the left inguinal nodal mass completed on May 24, 2019.   Current therapy: CHOP without rituximab started on Jun 06, 2019.  He is here for cycle 2 of therapy.  Interim History: [name] returns today for repeat evaluation.  Since the last visit, the completed the second cycle of chemotherapy but required another prolonged hospitalization related to heart failure and cytopenias.  Since his discharge, he reports no major changes or complaints.  He denies any dyspnea exertion chest pain.  Denies any palpitation.  As performance status remains limited in his mobility is also limited.  He denies any lymphadenopathy or bleeding.  Denies any hemoptysis or night emesis.  He does report cough but no wheezing.     Medications: Updated on review. Current Outpatient Medications  Medication Sig Dispense Refill  . allopurinol (ZYLOPRIM) 300 MG tablet TAKE ONE TABLET BY MOUTH DAILY 60 tablet 0  . amiodarone (PACERONE) 200 MG tablet Take 1 tablet (200 mg total) by mouth daily. 30 tablet 1  . budesonide (PULMICORT) 0.5 MG/2ML nebulizer solution Take 2 mLs (0.5 mg total) by nebulization 2 (two) times daily. 10 mL 12  . calcium carbonate (TUMS - DOSED IN MG ELEMENTAL CALCIUM) 500 MG chewable tablet Chew 1 tablet (200 mg of elemental calcium total) by mouth 3 (three) times daily. 90 tablet 0  . cefdinir (OMNICEF) 300 MG capsule Take 1 capsule (300 mg total) by mouth 2 (two) times daily for 5 days. 10 capsule 0  . clopidogrel (PLAVIX) 75 MG tablet Take 1 tablet (75 mg total) by mouth daily. 90 tablet 3  . digoxin (LANOXIN) 0.125 MG tablet Take 1 tablet (125 mcg total) by mouth  daily. 30 tablet 11  . feeding supplement, ENSURE ENLIVE, (ENSURE ENLIVE) LIQD Take 237 mLs by mouth 2 (two) times daily between meals. 237 mL 12  . furosemide (LASIX) 40 MG tablet Take 1 tablet (40 mg total) by mouth daily. 30 tablet 2  . lamoTRIgine (LAMICTAL) 100 MG tablet Take 1 tablet (100 mg total) by mouth 2 (two) times daily. 180 tablet 3  . levETIRAcetam (KEPPRA) 750 MG tablet Take 1 tablet (750 mg total) by mouth 2 (two) times daily. 180 tablet 3  . lidocaine-prilocaine (EMLA) cream Apply 1 application topically as needed. 30 g 0  . midodrine (PROAMATINE) 5 MG tablet Take 1 tablet (5 mg total) by mouth 3 (three) times daily with meals. 90 tablet 3  . mirtazapine (REMERON SOL-TAB) 15 MG disintegrating tablet Take 1 tablet (15 mg total) by mouth at bedtime. 30 tablet 0  . nitroGLYCERIN (NITROSTAT) 0.4 MG SL tablet Place 1 tablet (0.4 mg total) under the tongue every 5 (five) minutes as needed for chest pain. 25 tablet 4  . ondansetron (ZOFRAN) 4 MG tablet Take 1 tablet (4 mg total) by mouth every 6 (six) hours as needed for nausea. 20 tablet 0  . pantoprazole (PROTONIX) 40 MG tablet Take 1 tablet (40 mg total) by mouth daily. 30 tablet 3  . potassium chloride SA (KLOR-CON) 20 MEQ tablet Take 1 tablet (20 mEq total) by mouth daily. 90 tablet 3  . predniSONE (DELTASONE) 50 MG tablet Take 2 tablets for 5 days  every 21 days with chemotherapy. (Patient not taking: Reported on 07/05/2019) 60 tablet 1  . prochlorperazine (COMPAZINE) 10 MG tablet Take 1 tablet (10 mg total) by mouth every 6 (six) hours as needed for nausea or vomiting. 30 tablet 0  . rosuvastatin (CRESTOR) 5 MG tablet Take 1 tablet (5 mg total) by mouth daily. (Patient taking differently: Take 5 mg by mouth at bedtime. ) 30 tablet 6  . sertraline (ZOLOFT) 25 MG tablet Take 1 tablet (25 mg total) by mouth daily. 90 tablet 3   No current facility-administered medications for this visit.     Allergies: No Known  Allergies    Physical Exam: Blood pressure 111/78, pulse 92, temperature 97.7 F (36.5 C), temperature source Axillary, resp. rate (!) 22, SpO2 97 %.    ECOG:  2    General appearance: Comfortable appearing without any discomfort Head: Normocephalic without any trauma Oropharynx: Mucous membranes are moist and pink without any thrush or ulcers. Eyes: Pupils are equal and round reactive to light. Lymph nodes: No cervical, supraclavicular, inguinal or axillary lymphadenopathy.   Heart:regular rate and rhythm.  S1 and S2.  2+ edema at the ankle noted. Lung: Clear without any rhonchi or wheezes.  Scattered wheezes and rhonchi noted. Abdomin: Soft, nontender, nondistended with good bowel sounds.  No hepatosplenomegaly. Musculoskeletal: No joint deformity or effusion.  Full range of motion noted. Neurological: No deficits noted on motor, sensory and deep tendon reflex exam. Skin: No petechial rash or dryness.  Appeared moist.      Lab Results: Lab Results  Component Value Date   WBC 3.6 (L) 07/25/2019   HGB 7.6 (L) 07/25/2019   HCT 24.2 (L) 07/25/2019   MCV 90.0 07/25/2019   PLT 88 (L) 07/25/2019     Chemistry      Component Value Date/Time   NA 134 (L) 07/25/2019 0426   NA 134 04/16/2019 1215   K 3.3 (L) 07/25/2019 0426   CL 93 (L) 07/25/2019 0426   CO2 33 (H) 07/25/2019 0426   BUN 17 07/25/2019 0426   BUN 14 04/16/2019 1215   CREATININE 0.70 07/25/2019 0426   CREATININE 0.80 06/15/2019 1400      Component Value Date/Time   CALCIUM 7.4 (L) 07/25/2019 0426   ALKPHOS 63 07/24/2019 0334   AST 26 07/24/2019 0334   AST 18 06/15/2019 1400   ALT 22 07/24/2019 0334   ALT 23 06/15/2019 1400   BILITOT 0.6 07/24/2019 0334   BILITOT 0.8 06/15/2019 1400        Impression and Plan:   64 year old with:  1.  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  He is presented with extensive adenopathy above and below the diaphragm.    He has completed 2  cycles of chemotherapy utilizing CHOP and rituximab but with a few complications.  He did require prolonged hospitalization for cardiac reasons including cardiogenic shock and heart failure.  Risks and benefits of continuing chemotherapy were reviewed today.  Potential complications that include nausea, vomiting, myelosuppression and further cardiac complications were reviewed.  After discussion he is agreeable to proceed with cycle 3 with close to 50% dose reduction of his Adriamycin, vincristine and cyclophosphamide.  Rituximab will remain at the same dose.  We will obtain imaging studies to station after 3 cycles of therapy.     2.  IV access: PICC line remains in place and will continue to be in use for the time being.  Risks and benefits of  a Port-A-Cath insertion were reviewed.  He is agreeable to have that done since he is off anticoagulation.  3.  Antiemetics: No nausea or vomiting reported at this time.  Antiemetics available to him.  4.  Tumor lysis syndrome: No evidence noted at this time with normal electrolytes and kidney function.  5.  Cardiac considerations: He remains in normal sinus rhythm and off anticoagulation at this time.    6.  Left lower extremity edema: Improved after aggressive diuresis recent hospitalization.  7.  Prognosis: His disease remains treatable and potentially curable despite the advanced nature of his malignancy.  His performance status is marginal although aggressive measures remains warranted.  8.  Follow-up: Will be in 3 weeks for the next cycle of therapy.   40  minutes were spent on this encounter.  Time was dedicated to updating his disease status, reviewing laboratory data, discussing treatment options as well as addressing complications related to his cancer and cancer therapy.    Zola Button, MD 7/12/20218:14 AM

## 2019-07-30 NOTE — Patient Instructions (Signed)
[facility] Discharge Instructions for Patients Receiving Chemotherapy  Today you received the following chemotherapy agents: Doxorubicin, Vincristine, Cytoxan, Rituximab  To help prevent nausea and vomiting after your treatment, we encourage you to take your nausea medication as directed.    If you develop nausea and vomiting that is not controlled by your nausea medication, call the clinic.   BELOW ARE SYMPTOMS THAT SHOULD BE REPORTED IMMEDIATELY:  *FEVER GREATER THAN 100.5 F  *CHILLS WITH OR WITHOUT FEVER  NAUSEA AND VOMITING THAT IS NOT CONTROLLED WITH YOUR NAUSEA MEDICATION  *UNUSUAL SHORTNESS OF BREATH  *UNUSUAL BRUISING OR BLEEDING  TENDERNESS IN MOUTH AND THROAT WITH OR WITHOUT PRESENCE OF ULCERS  *URINARY PROBLEMS  *BOWEL PROBLEMS  UNUSUAL RASH Items with * indicate a potential emergency and should be followed up as soon as possible.  Feel free to call the clinic should you have any questions or concerns. The clinic phone number is [phone].  Please show the [facility] at check-in to the Emergency Department and triage nurse.

## 2019-07-31 ENCOUNTER — Encounter: Payer: Self-pay | Admitting: Internal Medicine

## 2019-07-31 ENCOUNTER — Ambulatory Visit (INDEPENDENT_AMBULATORY_CARE_PROVIDER_SITE_OTHER): Payer: No Typology Code available for payment source | Admitting: Internal Medicine

## 2019-07-31 VITALS — BP 102/64 | HR 106 | Temp 97.5°F | Wt 106.0 lb

## 2019-07-31 DIAGNOSIS — R570 Cardiogenic shock: Secondary | ICD-10-CM

## 2019-07-31 DIAGNOSIS — C8335 Diffuse large B-cell lymphoma, lymph nodes of inguinal region and lower limb: Secondary | ICD-10-CM | POA: Diagnosis not present

## 2019-07-31 DIAGNOSIS — Z09 Encounter for follow-up examination after completed treatment for conditions other than malignant neoplasm: Secondary | ICD-10-CM | POA: Diagnosis not present

## 2019-07-31 DIAGNOSIS — I4819 Other persistent atrial fibrillation: Secondary | ICD-10-CM

## 2019-07-31 DIAGNOSIS — L89322 Pressure ulcer of left buttock, stage 2: Secondary | ICD-10-CM

## 2019-07-31 DIAGNOSIS — A419 Sepsis, unspecified organism: Secondary | ICD-10-CM

## 2019-07-31 DIAGNOSIS — R531 Weakness: Secondary | ICD-10-CM

## 2019-07-31 DIAGNOSIS — D709 Neutropenia, unspecified: Secondary | ICD-10-CM

## 2019-07-31 DIAGNOSIS — E43 Unspecified severe protein-calorie malnutrition: Secondary | ICD-10-CM

## 2019-07-31 NOTE — Addendum Note (Signed)
Addended by: Westley Hummer B on: 07/31/2019 12:12 PM   Modules accepted: Orders

## 2019-07-31 NOTE — Patient Instructions (Addendum)
° ° ° °-  Nice seeing you today!!  -Follow up as needed until chemo is done.  -Will enter orders for home health and a hospital bed.
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
## 2019-08-01 ENCOUNTER — Other Ambulatory Visit: Payer: Self-pay

## 2019-08-01 ENCOUNTER — Inpatient Hospital Stay: Payer: No Typology Code available for payment source

## 2019-08-01 VITALS — BP 110/72

## 2019-08-01 DIAGNOSIS — Z95828 Presence of other vascular implants and grafts: Secondary | ICD-10-CM

## 2019-08-01 DIAGNOSIS — C8299 Follicular lymphoma, unspecified, extranodal and solid organ sites: Secondary | ICD-10-CM

## 2019-08-01 DIAGNOSIS — C8335 Diffuse large B-cell lymphoma, lymph nodes of inguinal region and lower limb: Secondary | ICD-10-CM

## 2019-08-01 DIAGNOSIS — Z5112 Encounter for antineoplastic immunotherapy: Secondary | ICD-10-CM | POA: Diagnosis not present

## 2019-08-01 MED ORDER — PEGFILGRASTIM-JMDB 6 MG/0.6ML ~~LOC~~ SOSY
PREFILLED_SYRINGE | SUBCUTANEOUS | Status: AC
  Filled 2019-08-01: qty 0.6

## 2019-08-01 MED ORDER — HEPARIN SOD (PORK) LOCK FLUSH 100 UNIT/ML IV SOLN
250.0000 [IU] | Freq: Once | INTRAVENOUS | Status: AC
  Administered 2019-08-01: 250 [IU]
  Filled 2019-08-01: qty 5

## 2019-08-01 MED ORDER — SODIUM CHLORIDE 0.9% FLUSH
10.0000 mL | Freq: Once | INTRAVENOUS | Status: AC
  Administered 2019-08-01: 10 mL
  Filled 2019-08-01: qty 10

## 2019-08-01 MED ORDER — PEGFILGRASTIM-JMDB 6 MG/0.6ML ~~LOC~~ SOSY
6.0000 mg | PREFILLED_SYRINGE | Freq: Once | SUBCUTANEOUS | Status: AC
  Administered 2019-08-01: 6 mg via SUBCUTANEOUS

## 2019-08-01 NOTE — Patient Instructions (Signed)

## 2019-08-02 ENCOUNTER — Telehealth: Payer: Self-pay | Admitting: Oncology

## 2019-08-02 NOTE — Telephone Encounter (Signed)
Scheduled per 07/12 los, called and spoke with patient's relative. Patient will be notified of upcoming appointments.

## 2019-08-03 ENCOUNTER — Telehealth: Payer: Self-pay

## 2019-08-03 ENCOUNTER — Ambulatory Visit: Payer: No Typology Code available for payment source | Admitting: Radiation Oncology

## 2019-08-03 NOTE — Telephone Encounter (Signed)
I called the patient's wife (since I was unable to reach patient) today about patient's upcoming follow-up appointment in radiation oncology.   Given the state of the  COVID-19 pandemic, concerning case numbers in our community, and guidance from [facility], I offered a phone assessment with the patient's wife to determine if coming to the clinic was necessary. The patient's wife accepted.  I let the patient's wife know that I had spoken with Dr. Isidore Moos, and she wanted them to know the importance of washing their hands for at least 20 seconds at a time, especially after going out in public, and before they eat. Limit going out in public whenever possible. Do not touch your face, unless your hands are clean, such as when bathing. Get plenty of rest, eat well, and stay hydrated. Tony Saunders verbalized understanding and agreement.  Symptomatically, the patient is doing relatively well. Tony Saunders reports that the patient is weaker today since he received his chemo regimen this past Monday, but he is trying to do light exercises to help keep his strength up. She states patient had a follow-up with his caridologist Tony Saunders, who recommended compression stockings to help with patient's swelling. Tony Saunders ordered a specialty pair, and states it has helped with swelling in patient's calf (she is interested in getting a pair that would go up his thigh to help with upper leg swelling). She reports that Tony Saunders reduced the dose of patient's chemotherapy for this most recent cycle, and she is hopeful that the reduction will help prevent another hospitalization. She also mentioned that Tony Saunders plans to schedule a CT scan to see cancer's response to treatment.   All questions were answered to Tony Saunders' satisfaction.  I encouraged the her and patient to call with any further questions. Otherwise, the plan is continue with follow-ups and close monitoring by Tony Saunders and his team.     Patient's wife is pleased with this plan, and we will cancel their upcoming follow-up to reduce the risk of COVID-19 transmission.

## 2019-08-08 ENCOUNTER — Telehealth: Payer: Self-pay | Admitting: *Deleted

## 2019-08-08 DIAGNOSIS — L89322 Pressure ulcer of left buttock, stage 2: Secondary | ICD-10-CM

## 2019-08-08 DIAGNOSIS — C8335 Diffuse large B-cell lymphoma, lymph nodes of inguinal region and lower limb: Secondary | ICD-10-CM

## 2019-08-08 NOTE — Telephone Encounter (Signed)
Patient wife called after hours line. Patient wife reports her husband sees Dr. Deniece Ree for bed sores. His dressing on his bottom keeps coming off. Can she use liquid band aid instead.  Sores are on his butt cheek near the crack. Tail bone one has healed. One tiny one is bleeding. Foam pads and Tegaderm are not staying on.

## 2019-08-08 NOTE — Telephone Encounter (Signed)
Spoke with wife and she is having a hard time finding someone to come out to the home. I will speak with Deb.

## 2019-08-08 NOTE — Telephone Encounter (Signed)
Does he have a HHRN for wound care? Needs one. They can dress wounds at home and/or teach wife to do so.

## 2019-08-09 NOTE — Addendum Note (Signed)
Addended by: Westley Hummer B on: 08/09/2019 10:16 AM   Modules accepted: Orders

## 2019-08-09 NOTE — Telephone Encounter (Signed)
Referral placed.

## 2019-08-10 ENCOUNTER — Other Ambulatory Visit: Payer: Self-pay | Admitting: Student

## 2019-08-12 ENCOUNTER — Other Ambulatory Visit: Payer: Self-pay | Admitting: Radiology

## 2019-08-13 ENCOUNTER — Ambulatory Visit (HOSPITAL_COMMUNITY)
Admission: RE | Admit: 2019-08-13 | Discharge: 2019-08-13 | Disposition: A | Discharge status: Home / Self Care | Payer: No Typology Code available for payment source | Source: Ambulatory Visit | Attending: Oncology | Admitting: Oncology

## 2019-08-13 ENCOUNTER — Encounter (HOSPITAL_COMMUNITY): Payer: Self-pay

## 2019-08-13 ENCOUNTER — Other Ambulatory Visit: Payer: Self-pay

## 2019-08-13 ENCOUNTER — Telehealth: Payer: Self-pay

## 2019-08-13 ENCOUNTER — Other Ambulatory Visit: Payer: Self-pay | Admitting: Oncology

## 2019-08-13 DIAGNOSIS — C8299 Follicular lymphoma, unspecified, extranodal and solid organ sites: Secondary | ICD-10-CM

## 2019-08-13 DIAGNOSIS — Z79899 Other long term (current) drug therapy: Secondary | ICD-10-CM | POA: Insufficient documentation

## 2019-08-13 DIAGNOSIS — R569 Unspecified convulsions: Secondary | ICD-10-CM | POA: Diagnosis not present

## 2019-08-13 DIAGNOSIS — I252 Old myocardial infarction: Secondary | ICD-10-CM | POA: Diagnosis not present

## 2019-08-13 DIAGNOSIS — Z955 Presence of coronary angioplasty implant and graft: Secondary | ICD-10-CM | POA: Insufficient documentation

## 2019-08-13 DIAGNOSIS — I4819 Other persistent atrial fibrillation: Secondary | ICD-10-CM | POA: Insufficient documentation

## 2019-08-13 DIAGNOSIS — Z7902 Long term (current) use of antithrombotics/antiplatelets: Secondary | ICD-10-CM | POA: Diagnosis not present

## 2019-08-13 DIAGNOSIS — I2510 Atherosclerotic heart disease of native coronary artery without angina pectoris: Secondary | ICD-10-CM | POA: Diagnosis not present

## 2019-08-13 DIAGNOSIS — C8330 Diffuse large B-cell lymphoma, unspecified site: Secondary | ICD-10-CM | POA: Diagnosis present

## 2019-08-13 DIAGNOSIS — Z7951 Long term (current) use of inhaled steroids: Secondary | ICD-10-CM | POA: Insufficient documentation

## 2019-08-13 DIAGNOSIS — I5042 Chronic combined systolic (congestive) and diastolic (congestive) heart failure: Secondary | ICD-10-CM | POA: Insufficient documentation

## 2019-08-13 DIAGNOSIS — C8335 Diffuse large B-cell lymphoma, lymph nodes of inguinal region and lower limb: Secondary | ICD-10-CM

## 2019-08-13 HISTORY — PX: IR IMAGING GUIDED PORT INSERTION: IMG5740

## 2019-08-13 LAB — CBC WITH DIFFERENTIAL/PLATELET
Abs Immature Granulocytes: 0.2 10*3/uL — ABNORMAL HIGH (ref 0.00–0.07)
Basophils Absolute: 0 10*3/uL (ref 0.0–0.1)
Basophils Relative: 0 %
Eosinophils Absolute: 0 10*3/uL (ref 0.0–0.5)
Eosinophils Relative: 0 %
HCT: 29.6 % — ABNORMAL LOW (ref 39.0–52.0)
Hemoglobin: 8.7 g/dL — ABNORMAL LOW (ref 13.0–17.0)
Immature Granulocytes: 2 %
Lymphocytes Relative: 4 %
Lymphs Abs: 0.4 10*3/uL — ABNORMAL LOW (ref 0.7–4.0)
MCH: 28.7 pg (ref 26.0–34.0)
MCHC: 29.4 g/dL — ABNORMAL LOW (ref 30.0–36.0)
MCV: 97.7 fL (ref 80.0–100.0)
Monocytes Absolute: 0.6 10*3/uL (ref 0.1–1.0)
Monocytes Relative: 6 %
Neutro Abs: 8.6 10*3/uL — ABNORMAL HIGH (ref 1.7–7.7)
Neutrophils Relative %: 88 %
Platelets: 87 10*3/uL — ABNORMAL LOW (ref 150–400)
RBC: 3.03 MIL/uL — ABNORMAL LOW (ref 4.22–5.81)
RDW: 21.4 % — ABNORMAL HIGH (ref 11.5–15.5)
WBC: 9.8 10*3/uL (ref 4.0–10.5)
nRBC: 0 % (ref 0.0–0.2)

## 2019-08-13 LAB — PROTIME-INR
INR: 1.1 (ref 0.8–1.2)
Prothrombin Time: 14.2 seconds (ref 11.4–15.2)

## 2019-08-13 MED ORDER — FENTANYL CITRATE (PF) 100 MCG/2ML IJ SOLN
INTRAMUSCULAR | Status: AC
  Filled 2019-08-13: qty 2

## 2019-08-13 MED ORDER — CEFAZOLIN SODIUM-DEXTROSE 2-4 GM/100ML-% IV SOLN
2.0000 g | INTRAVENOUS | Status: AC
  Administered 2019-08-13: 2 g via INTRAVENOUS

## 2019-08-13 MED ORDER — SODIUM CHLORIDE 0.9 % IV SOLN: INTRAVENOUS | Status: DC

## 2019-08-13 MED ORDER — LIDOCAINE-EPINEPHRINE 1 %-1:100000 IJ SOLN
INTRAMUSCULAR | Status: AC
  Filled 2019-08-13: qty 1

## 2019-08-13 MED ORDER — MIDAZOLAM HCL 2 MG/2ML IJ SOLN
INTRAMUSCULAR | Status: AC | PRN
  Administered 2019-08-13 (×2): 0.5 mg via INTRAVENOUS

## 2019-08-13 MED ORDER — FENTANYL CITRATE (PF) 100 MCG/2ML IJ SOLN
INTRAMUSCULAR | Status: AC | PRN
  Administered 2019-08-13: 25 ug via INTRAVENOUS

## 2019-08-13 MED ORDER — LIDOCAINE HCL 1 % IJ SOLN
INTRAMUSCULAR | Status: AC
  Filled 2019-08-13: qty 20

## 2019-08-13 MED ORDER — LIDOCAINE HCL (PF) 1 % IJ SOLN
INTRAMUSCULAR | Status: AC | PRN
  Administered 2019-08-13: 10 mL
  Administered 2019-08-13: 5 mL

## 2019-08-13 MED ORDER — HEPARIN SOD (PORK) LOCK FLUSH 100 UNIT/ML IV SOLN
INTRAVENOUS | Status: AC
  Filled 2019-08-13: qty 5

## 2019-08-13 MED ORDER — CEFAZOLIN SODIUM-DEXTROSE 2-4 GM/100ML-% IV SOLN
INTRAVENOUS | Status: AC
  Filled 2019-08-13: qty 100

## 2019-08-13 MED ORDER — MIDAZOLAM HCL 2 MG/2ML IJ SOLN
INTRAMUSCULAR | Status: AC
  Filled 2019-08-13: qty 2

## 2019-08-13 MED ORDER — HEPARIN SOD (PORK) LOCK FLUSH 100 UNIT/ML IV SOLN
INTRAVENOUS | Status: AC | PRN
  Administered 2019-08-13: 500 [IU] via INTRAVENOUS

## 2019-08-13 NOTE — Procedures (Signed)
Interventional Radiology Procedure Note  Procedure: Single Lumen Power Port Placement    Access:  Right IJ vein.  Findings: Catheter tip positioned at SVC/RA junction. Port is ready for immediate use.   Complications: None  EBL: < 10 mL  Recommendations:  - Ok to shower in 24 hours - Do not submerge for 7 days - Routine line care   Bertil Brickey T. Marry Kusch, M.D Pager:  [phone]   

## 2019-08-13 NOTE — Discharge Instructions (Signed)
Urgent needs - IR on call MD [phone]  Wound - May remove dressing and shower in 24 to 48 hours.  Keep site clean and dry.  Replace with bandaid. Do not submerge in tub or water until site healing well.  If ordered by your provider, may start Emla cream in 2 weeks or after incision is healed.  After completion of treatment, your provider should have you set up for monthly port flushes.                                                            Moderate Conscious Sedation, Adult, Care After These instructions provide you with information about caring for yourself after your procedure. Your health care provider may also give you more specific instructions. Your treatment has been planned according to current medical practices, but problems sometimes occur. Call your health care provider if you have any problems or questions after your procedure. What can I expect after the procedure? After your procedure, it is common:  To feel sleepy for several hours.  To feel clumsy and have poor balance for several hours.  To have poor judgment for several hours.  To vomit if you eat too soon. Follow these instructions at home: For at least 24 hours after the procedure:  Do not: ? Participate in activities where you could fall or become injured. ? Drive. ? Use heavy machinery. ? Drink alcohol. ? Take sleeping pills or medicines that cause drowsiness. ? Make important decisions or sign legal documents. ? Take care of children on your own.  Rest. Eating and drinking  Follow the diet recommended by your health care provider.  If you vomit: ? Drink water, juice, or soup when you can drink without vomiting. ? Make sure you have little or no nausea before eating solid foods. General instructions  Have a responsible adult stay with you until you are awake and alert.  Take over-the-counter and prescription medicines only as told by your health care provider.  If you smoke, do not smoke  without supervision.  Keep all follow-up visits as told by your health care provider. This is important. Contact a health care provider if:  You keep feeling nauseous or you keep vomiting.  You feel light-headed.  You develop a rash.  You have a fever. Get help right away if:  You have trouble breathing. This information is not intended to replace advice given to you by your health care provider. Make sure you discuss any questions you have with your health care provider. Document Revised: 12/17/2016 Document Reviewed: 04/26/2015 Elsevier Patient Education  2020 Elsevier Inc.   Implanted Port Insertion, Care After This sheet gives you information about how to care for yourself after your procedure. Your health care provider may also give you more specific instructions. If you have problems or questions, contact your health care provider. What can I expect after the procedure? After the procedure, it is common to have:  Discomfort at the port insertion site.  Bruising on the skin over the port. This should improve over 3-4 days. Follow these instructions at home: Port care  After your port is placed, you will get a manufacturer's information card. The card has information about your port. Keep this card with you at all times.  Take care of   the port as told by your health care provider. Ask your health care provider if you or a family member can get training for taking care of the port at home. A home health care nurse may also take care of the port.  Make sure to remember what type of port you have. Incision care  Follow instructions from your health care provider about how to take care of your port insertion site. Make sure you: ? Wash your hands with soap and water before and after you change your bandage (dressing). If soap and water are not available, use hand sanitizer. ? Change your dressing as told by your health care provider. ? Leave stitches (sutures), skin glue, or  adhesive strips in place. These skin closures may need to stay in place for 2 weeks or longer. If adhesive strip edges start to loosen and curl up, you may trim the loose edges. Do not remove adhesive strips completely unless your health care provider tells you to do that.  Check your port insertion site every day for signs of infection. Check for: ? Redness, swelling, or pain. ? Fluid or blood. ? Warmth. ? Pus or a bad smell. Activity  Return to your normal activities as told by your health care provider. Ask your health care provider what activities are safe for you.  Do not lift anything that is heavier than 10 lb (4.5 kg), or the limit that you are told, until your health care provider says that it is safe. General instructions  Take over-the-counter and prescription medicines only as told by your health care provider.  Do not take baths, swim, or use a hot tub until your health care provider approves. Ask your health care provider if you may take showers. You may only be allowed to take sponge baths.  Do not drive for 24 hours if you were given a sedative during your procedure.  Wear a medical alert bracelet in case of an emergency. This will tell any health care providers that you have a port.  Keep all follow-up visits as told by your health care provider. This is important. Contact a health care provider if:  You cannot flush your port with saline as directed, or you cannot draw blood from the port.  You have a fever or chills.  You have redness, swelling, or pain around your port insertion site.  You have fluid or blood coming from your port insertion site.  Your port insertion site feels warm to the touch.  You have pus or a bad smell coming from the port insertion site. Get help right away if:  You have chest pain or shortness of breath.  You have bleeding from your port that you cannot control. Summary  Take care of the port as told by your health care provider.  Keep the manufacturer's information card with you at all times.  Change your dressing as told by your health care provider.  Contact a health care provider if you have a fever or chills or if you have redness, swelling, or pain around your port insertion site.  Keep all follow-up visits as told by your health care provider. This information is not intended to replace advice given to you by your health care provider. Make sure you discuss any questions you have with your health care provider. Document Revised: 08/02/2017 Document Reviewed: 08/02/2017 Elsevier Patient Education  2020 Elsevier Inc.   

## 2019-08-13 NOTE — Telephone Encounter (Signed)
Called pt's wife as this nurse was not able to establish care with any home health care agencies for wound care/PT d/t pt's medical-sharing plan (which is similar to insurance). Pt's wife states that representative at [facility] has reached out to 20 agencies trying to get help for pt, but has been unsuccessful. I suggested to pt have PCP (who has been managing pt's wounds) put in a referral to o/p wound care clinic since no home health agencies will take [place]. Not sure where else to go for this pt other than maybe a social work consult? Please advise.   Pt's wife verbalized thanks and understanding for helping.

## 2019-08-13 NOTE — Telephone Encounter (Signed)
Pt's wife called requesting referral/order for home health, nursing for wound care, as well as PT. Message sent to Dr Alen Blew with this request.
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
## 2019-08-17 ENCOUNTER — Other Ambulatory Visit: Payer: Self-pay

## 2019-08-17 ENCOUNTER — Ambulatory Visit (HOSPITAL_BASED_OUTPATIENT_CLINIC_OR_DEPARTMENT_OTHER)
Admission: RE | Admit: 2019-08-17 | Discharge: 2019-08-17 | Disposition: A | Discharge status: Home / Self Care | Payer: No Typology Code available for payment source | Source: Ambulatory Visit | Attending: Oncology | Admitting: Oncology

## 2019-08-17 ENCOUNTER — Other Ambulatory Visit: Payer: Self-pay | Admitting: Oncology

## 2019-08-17 DIAGNOSIS — C8299 Follicular lymphoma, unspecified, extranodal and solid organ sites: Secondary | ICD-10-CM | POA: Diagnosis present

## 2019-08-17 MED ORDER — IOHEXOL 300 MG/ML  SOLN
100.0000 mL | Freq: Once | INTRAMUSCULAR | Status: AC | PRN
  Administered 2019-08-17: 100 mL via INTRAVENOUS

## 2019-08-20 ENCOUNTER — Inpatient Hospital Stay: Payer: No Typology Code available for payment source | Attending: Oncology

## 2019-08-20 ENCOUNTER — Other Ambulatory Visit: Payer: Self-pay

## 2019-08-20 ENCOUNTER — Inpatient Hospital Stay (HOSPITAL_BASED_OUTPATIENT_CLINIC_OR_DEPARTMENT_OTHER): Payer: No Typology Code available for payment source | Admitting: Oncology

## 2019-08-20 ENCOUNTER — Inpatient Hospital Stay: Payer: No Typology Code available for payment source

## 2019-08-20 VITALS — BP 114/65 | HR 62 | Temp 97.3°F | Resp 18 | Ht 72.0 in

## 2019-08-20 VITALS — BP 111/70 | HR 60 | Temp 97.5°F | Resp 18 | Wt 195.2 lb

## 2019-08-20 DIAGNOSIS — C8330 Diffuse large B-cell lymphoma, unspecified site: Secondary | ICD-10-CM | POA: Insufficient documentation

## 2019-08-20 DIAGNOSIS — R590 Localized enlarged lymph nodes: Secondary | ICD-10-CM | POA: Insufficient documentation

## 2019-08-20 DIAGNOSIS — C8335 Diffuse large B-cell lymphoma, lymph nodes of inguinal region and lower limb: Secondary | ICD-10-CM

## 2019-08-20 DIAGNOSIS — Z5112 Encounter for antineoplastic immunotherapy: Secondary | ICD-10-CM | POA: Insufficient documentation

## 2019-08-20 DIAGNOSIS — Z5111 Encounter for antineoplastic chemotherapy: Secondary | ICD-10-CM | POA: Insufficient documentation

## 2019-08-20 DIAGNOSIS — C8299 Follicular lymphoma, unspecified, extranodal and solid organ sites: Secondary | ICD-10-CM

## 2019-08-20 DIAGNOSIS — Z95828 Presence of other vascular implants and grafts: Secondary | ICD-10-CM

## 2019-08-20 DIAGNOSIS — Z79899 Other long term (current) drug therapy: Secondary | ICD-10-CM | POA: Diagnosis not present

## 2019-08-20 DIAGNOSIS — Z7901 Long term (current) use of anticoagulants: Secondary | ICD-10-CM | POA: Diagnosis not present

## 2019-08-20 DIAGNOSIS — R600 Localized edema: Secondary | ICD-10-CM | POA: Diagnosis not present

## 2019-08-20 DIAGNOSIS — Z5189 Encounter for other specified aftercare: Secondary | ICD-10-CM | POA: Diagnosis not present

## 2019-08-20 DIAGNOSIS — K8020 Calculus of gallbladder without cholecystitis without obstruction: Secondary | ICD-10-CM | POA: Diagnosis not present

## 2019-08-20 DIAGNOSIS — I700 Atherosclerosis of aorta: Secondary | ICD-10-CM | POA: Diagnosis not present

## 2019-08-20 DIAGNOSIS — D61818 Other pancytopenia: Secondary | ICD-10-CM | POA: Diagnosis not present

## 2019-08-20 DIAGNOSIS — J90 Pleural effusion, not elsewhere classified: Secondary | ICD-10-CM | POA: Insufficient documentation

## 2019-08-20 DIAGNOSIS — J439 Emphysema, unspecified: Secondary | ICD-10-CM | POA: Insufficient documentation

## 2019-08-20 LAB — CBC WITH DIFFERENTIAL (CANCER CENTER ONLY)
Abs Immature Granulocytes: 0.28 10*3/uL — ABNORMAL HIGH (ref 0.00–0.07)
Basophils Absolute: 0 10*3/uL (ref 0.0–0.1)
Basophils Relative: 0 %
Eosinophils Absolute: 0 10*3/uL (ref 0.0–0.5)
Eosinophils Relative: 0 %
HCT: 27.6 % — ABNORMAL LOW (ref 39.0–52.0)
Hemoglobin: 8.3 g/dL — ABNORMAL LOW (ref 13.0–17.0)
Immature Granulocytes: 3 %
Lymphocytes Relative: 6 %
Lymphs Abs: 0.5 10*3/uL — ABNORMAL LOW (ref 0.7–4.0)
MCH: 28.6 pg (ref 26.0–34.0)
MCHC: 30.1 g/dL (ref 30.0–36.0)
MCV: 95.2 fL (ref 80.0–100.0)
Monocytes Absolute: 0.4 10*3/uL (ref 0.1–1.0)
Monocytes Relative: 5 %
Neutro Abs: 7 10*3/uL (ref 1.7–7.7)
Neutrophils Relative %: 86 %
Platelet Count: 110 10*3/uL — ABNORMAL LOW (ref 150–400)
RBC: 2.9 MIL/uL — ABNORMAL LOW (ref 4.22–5.81)
RDW: 21.2 % — ABNORMAL HIGH (ref 11.5–15.5)
WBC Count: 8.2 10*3/uL (ref 4.0–10.5)
nRBC: 0 % (ref 0.0–0.2)

## 2019-08-20 LAB — CMP (CANCER CENTER ONLY)
ALT: 11 U/L (ref 0–44)
AST: 17 U/L (ref 15–41)
Albumin: 2.6 g/dL — ABNORMAL LOW (ref 3.5–5.0)
Alkaline Phosphatase: 107 U/L (ref 38–126)
Anion gap: 9 (ref 5–15)
BUN: 16 mg/dL (ref 8–23)
CO2: 28 mmol/L (ref 22–32)
Calcium: 8.7 mg/dL — ABNORMAL LOW (ref 8.9–10.3)
Chloride: 103 mmol/L (ref 98–111)
Creatinine: 0.83 mg/dL (ref 0.61–1.24)
GFR, Est AFR Am: >60 mL/min (ref >60)
GFR, Estimated: >60 mL/min (ref >60)
Glucose, Bld: 88 mg/dL (ref 70–99)
Potassium: 4.3 mmol/L (ref 3.5–5.1)
Sodium: 140 mmol/L (ref 135–145)
Total Bilirubin: 0.4 mg/dL (ref 0.3–1.2)
Total Protein: 4.8 g/dL — ABNORMAL LOW (ref 6.5–8.1)

## 2019-08-20 MED ORDER — SODIUM CHLORIDE 0.9 % IV SOLN
150.0000 mg | Freq: Once | INTRAVENOUS | Status: AC
  Administered 2019-08-20: 150 mg via INTRAVENOUS
  Filled 2019-08-20: qty 150

## 2019-08-20 MED ORDER — DIPHENHYDRAMINE HCL 25 MG PO CAPS
ORAL_CAPSULE | ORAL | Status: AC
  Filled 2019-08-20: qty 2

## 2019-08-20 MED ORDER — SODIUM CHLORIDE 0.9 % IV SOLN
10.0000 mg | Freq: Once | INTRAVENOUS | Status: AC
  Administered 2019-08-20: 10 mg via INTRAVENOUS
  Filled 2019-08-20: qty 10

## 2019-08-20 MED ORDER — DOXORUBICIN HCL CHEMO IV INJECTION 2 MG/ML
25.0000 mg/m2 | Freq: Once | INTRAVENOUS | Status: AC
  Administered 2019-08-20: 52 mg via INTRAVENOUS
  Filled 2019-08-20: qty 26

## 2019-08-20 MED ORDER — DIPHENHYDRAMINE HCL 25 MG PO CAPS
50.0000 mg | ORAL_CAPSULE | Freq: Once | ORAL | Status: AC
  Administered 2019-08-20: 50 mg via ORAL

## 2019-08-20 MED ORDER — MIRTAZAPINE 15 MG PO TBDP: 15.0000 mg | ORAL_TABLET | Freq: Every day | ORAL | 3 refills | Status: DC

## 2019-08-20 MED ORDER — SODIUM CHLORIDE 0.9% FLUSH
10.0000 mL | Freq: Once | INTRAVENOUS | Status: AC
  Administered 2019-08-20: 10 mL
  Filled 2019-08-20: qty 10

## 2019-08-20 MED ORDER — MEDIHONEY WOUND/BURN DRESSING EX GEL: 1.5000 mL | CUTANEOUS | 0 refills | Status: AC | PRN

## 2019-08-20 MED ORDER — ACETAMINOPHEN 325 MG PO TABS
650.0000 mg | ORAL_TABLET | Freq: Once | ORAL | Status: AC
  Administered 2019-08-20: 650 mg via ORAL

## 2019-08-20 MED ORDER — PALONOSETRON HCL INJECTION 0.25 MG/5ML
INTRAVENOUS | Status: AC
  Filled 2019-08-20: qty 5

## 2019-08-20 MED ORDER — ACETAMINOPHEN 325 MG PO TABS
ORAL_TABLET | ORAL | Status: AC
  Filled 2019-08-20: qty 2

## 2019-08-20 MED ORDER — PALONOSETRON HCL INJECTION 0.25 MG/5ML
0.2500 mg | Freq: Once | INTRAVENOUS | Status: AC
  Administered 2019-08-20: 0.25 mg via INTRAVENOUS

## 2019-08-20 MED ORDER — SODIUM CHLORIDE 0.9 % IV SOLN
375.0000 mg/m2 | Freq: Once | INTRAVENOUS | Status: AC
  Administered 2019-08-20: 800 mg via INTRAVENOUS
  Filled 2019-08-20: qty 30

## 2019-08-20 MED ORDER — SODIUM CHLORIDE 0.9 % IV SOLN
500.0000 mg/m2 | Freq: Once | INTRAVENOUS | Status: AC
  Administered 2019-08-20: 1020 mg via INTRAVENOUS
  Filled 2019-08-20: qty 51

## 2019-08-20 MED ORDER — VINCRISTINE SULFATE CHEMO INJECTION 1 MG/ML
1.0000 mg | Freq: Once | INTRAVENOUS | Status: AC
  Administered 2019-08-20: 1 mg via INTRAVENOUS
  Filled 2019-08-20: qty 1

## 2019-08-20 MED ORDER — SODIUM CHLORIDE 0.9 % IV SOLN
Freq: Once | INTRAVENOUS | Status: AC
  Filled 2019-08-20: qty 250

## 2019-08-20 MED ORDER — HEPARIN SOD (PORK) LOCK FLUSH 100 UNIT/ML IV SOLN
500.0000 [IU] | Freq: Once | INTRAVENOUS | Status: AC | PRN
  Administered 2019-08-20: 500 [IU]
  Filled 2019-08-20: qty 5

## 2019-08-20 MED ORDER — SODIUM CHLORIDE 0.9% FLUSH
10.0000 mL | INTRAVENOUS | Status: DC | PRN
  Administered 2019-08-20: 10 mL
  Filled 2019-08-20: qty 10

## 2019-08-20 NOTE — Progress Notes (Signed)
Hematology and Oncology Follow Up Visit  Tony Saunders [id number] February 02, 1955 64 y.o. 08/20/2019 8:12 AM Tony Saunders, Tony Saunders, MDHernandez Lucilla Saunders*   Principle Diagnosis: 64 year old with diffuse large cell lymphoma diagnosed in May 2021. He presented with stage III disease with bulky adenopathy above and below the diaphragm.   Prior Therapy:  He is status post ultrasound guided core biopsy of the left inguinal nodal mass completed on May 24, 2019. Is here for cycle 4 of therapy.  Current therapy: CHOP without rituximab started on Jun 06, 2019.  He is here for cycle 4 of therapy.  Interim History: Tony Saunders presents today for a follow-up visit. Since the last visit, he reports no major changes in his health.  He did not report any hospitalization or illnesses since the last cycle of chemotherapy.  He had denied any nausea, vomiting or abdominal pain.  He continues to have bilateral lower extremity swelling at this time.  He is off all blood thinners and is not reporting any bleeding issues.  His appetite has been variable although is able to maintain reasonable weight.     Medications: Unchanged on review. Current Outpatient Medications  Medication Sig Dispense Refill  . allopurinol (ZYLOPRIM) 300 MG tablet TAKE ONE TABLET BY MOUTH DAILY 60 tablet 0  . amiodarone (PACERONE) 200 MG tablet Take 1 tablet (200 mg total) by mouth daily. 30 tablet 1  . budesonide (PULMICORT) 0.5 MG/2ML nebulizer solution Take 2 mLs (0.5 mg total) by nebulization 2 (two) times daily. 10 mL 12  . clopidogrel (PLAVIX) 75 MG tablet Take 1 tablet (75 mg total) by mouth daily. 90 tablet 3  . digoxin (LANOXIN) 0.125 MG tablet Take 1 tablet (125 mcg total) by mouth daily. 30 tablet 11  . feeding supplement, ENSURE ENLIVE, (ENSURE ENLIVE) LIQD Take 237 mLs by mouth 2 (two) times daily between meals. 237 mL 12  . furosemide (LASIX) 40 MG tablet Take 1 tablet (40 mg total) by mouth daily. 30 tablet 2  .  lamoTRIgine (LAMICTAL) 100 MG tablet Take 1 tablet (100 mg total) by mouth 2 (two) times daily. 180 tablet 3  . levETIRAcetam (KEPPRA) 750 MG tablet Take 1 tablet (750 mg total) by mouth 2 (two) times daily. 180 tablet 3  . lidocaine-prilocaine (EMLA) cream Apply 1 application topically as needed. 30 g 0  . midodrine (PROAMATINE) 5 MG tablet Take 1 tablet (5 mg total) by mouth 3 (three) times daily with meals. 90 tablet 3  . mirtazapine (REMERON SOL-TAB) 15 MG disintegrating tablet Take 1 tablet (15 mg total) by mouth at bedtime. 30 tablet 0  . ondansetron (ZOFRAN) 4 MG tablet Take 1 tablet (4 mg total) by mouth every 6 (six) hours as needed for nausea. 20 tablet 0  . pantoprazole (PROTONIX) 40 MG tablet Take 1 tablet (40 mg total) by mouth daily. 30 tablet 3  . potassium chloride SA (KLOR-CON) 20 MEQ tablet Take 1 tablet (20 mEq total) by mouth daily. 90 tablet 3  . predniSONE (DELTASONE) 50 MG tablet Take 2 tablets for 5 days every 21 days with chemotherapy. 60 tablet 1  . prochlorperazine (COMPAZINE) 10 MG tablet Take 1 tablet (10 mg total) by mouth every 6 (six) hours as needed for nausea or vomiting. 30 tablet 0  . rosuvastatin (CRESTOR) 5 MG tablet Take 1 tablet (5 mg total) by mouth daily. (Patient taking differently: Take 5 mg by mouth at bedtime. ) 30 tablet 6  . sertraline (ZOLOFT) 25 MG  tablet Take 1 tablet (25 mg total) by mouth daily. 90 tablet 3   No current facility-administered medications for this visit.     Allergies: No Known Allergies    Physical Exam: Blood pressure 114/65, pulse 62, temperature (!) 97.3 F (36.3 C), temperature source Temporal, resp. rate 18, height 6' (1.829 m), SpO2 99 %.     ECOG:  2    General appearance: Alert, awake without any distress. Head: Atraumatic without abnormalities Oropharynx: Without any thrush or ulcers. Eyes: No scleral icterus. Lymph nodes: No lymphadenopathy noted in the cervical, supraclavicular, or axillary  nodes Heart:regular rate and rhythm, without any murmurs or gallops.    Bilateral lower extremity edema noted. Lung: Clear to auscultation without any rhonchi, wheezes or dullness to percussion. Abdomin: Soft, nontender without any shifting dullness or ascites. Musculoskeletal: No clubbing or cyanosis. Neurological: No motor or sensory deficits. Skin: No rashes or lesions.      Lab Results: Lab Results  Component Value Date   WBC 9.8 08/13/2019   HGB 8.7 (L) 08/13/2019   HCT 29.6 (L) 08/13/2019   MCV 97.7 08/13/2019   PLT 87 (L) 08/13/2019     Chemistry      Component Value Date/Time   NA 140 07/30/2019 0812   NA 134 04/16/2019 1215   K 4.2 07/30/2019 0812   CL 100 07/30/2019 0812   CO2 30 07/30/2019 0812   BUN 12 07/30/2019 0812   BUN 14 04/16/2019 1215   CREATININE 0.72 07/30/2019 0812      Component Value Date/Time   CALCIUM 8.3 (L) 07/30/2019 0812   ALKPHOS 84 07/30/2019 0812   AST 24 07/30/2019 0812   ALT 22 07/30/2019 0812   BILITOT 0.7 07/30/2019 0812     IMPRESSION: 1. Marked interval decrease in size of the bulky abdominal and pelvic lymphadenopathy. Lymphadenopathy is completely resolved in some regions. No new or progressive lymphadenopathy on today's exam. 2. Interval decrease in the infiltrative disease of the right kidney with some restoration of corticomedullary differentiation. Right kidney is now more symmetric in size compared to the left. 3. Moderate to large right and moderate left pleural effusions with bilateral lower lobe collapse/consolidation. Pleural effusions are progressive in the interval. 4. Inflow of unopacified blood from the azygos vein in the SVC, but fibrin sheath or small collection of thrombus associated with the tip of the Port-A-Cath in the SVC. 5. Cholelithiasis. 6. Trace free fluid in the peritoneal cavity. 7. Diffuse body wall edema, markedly progressive in the interval. 8. Aortic Atherosclerosis (ICD10-I70.0) and  Emphysema (ICD10-J43.9).   Impression and Plan:   64 year old with:  1. Diffuse large cell lymphoma diagnosed in May 2021. He presented with stage III disease with disease below and above the diaphragm.  He completed 3 cycles of chemotherapy utilizing CHOP with rituximab. CT scan obtained on August 17, 2019 was personally reviewed and discussed today. He had a an excellent response to therapy with reduction in the majority of his disease. He still has residual adenopathy however with external iliac lymph node that measuring 3.0 x 1.3 cm, right groin lymphadenopathy also has decreased indicating residual disease of 1.9 x 1.8 previously was 4.4 x 3.6 cm.  Risks and benefits of continuing this therapy were reviewed. The plan is to complete 6 cycles of therapy and repeat imaging studies after that. Complication associated with this treatment were reiterated including neutropenia, sepsis, cardiac toxicity among others. His dose has been reduced because of pancytopenia.  He is agreeable to  continue at this time.    2.  IV access: Port-A-Cath has been placed without any complications at this time.  3.  Antiemetics: Antiemetics are available to him. No nausea or vomiting reported.  4.  Tumor lysis syndrome: No evidence of tumor lysis syndrome noted at this time. Electrolytes and kidney function continues to be within normal range.  5.  Cardiac considerations: No recent cardiac complications or hospitalization noted. He continues to be in normal sinus rhythm.  6.  Left lower extremity edema: Continue to be prominent at this time.  He has follow-up with cardiology and may need adjustment of his diuretics.  He is already on Lasix 40 mg daily.  7.  Prognosis: Aggressive therapy remains warranted at this time. His disease remains curable potentially if he achieves a complete response upon completing chemotherapy.  8. Cytopenia: His platelet count has improved with a dose reduction will be able to  receive chemotherapy on schedule.  9.  Follow-up: He will return in 3 weeks for the next cycle of therapy.   30  minutes were dedicated to this visit. The time was spent on reviewing laboratory data, imaging studies discussing disease status and addressing complications related to therapy.    Zola Button, MD 8/2/20218:12 AM

## 2019-08-20 NOTE — Patient Instructions (Signed)
[facility] Discharge Instructions for Patients Receiving Chemotherapy  Today you received the following chemotherapy agents: Doxorubicin, Vincristine, Cytoxan, Rituximab  To help prevent nausea and vomiting after your treatment, we encourage you to take your nausea medication as directed.    If you develop nausea and vomiting that is not controlled by your nausea medication, call the clinic.   BELOW ARE SYMPTOMS THAT SHOULD BE REPORTED IMMEDIATELY:  *FEVER GREATER THAN 100.5 F  *CHILLS WITH OR WITHOUT FEVER  NAUSEA AND VOMITING THAT IS NOT CONTROLLED WITH YOUR NAUSEA MEDICATION  *UNUSUAL SHORTNESS OF BREATH  *UNUSUAL BRUISING OR BLEEDING  TENDERNESS IN MOUTH AND THROAT WITH OR WITHOUT PRESENCE OF ULCERS  *URINARY PROBLEMS  *BOWEL PROBLEMS  UNUSUAL RASH Items with * indicate a potential emergency and should be followed up as soon as possible.  Feel free to call the clinic should you have any questions or concerns. The clinic phone number is [phone].  Please show the [facility] at check-in to the Emergency Department and triage nurse.

## 2019-08-22 ENCOUNTER — Other Ambulatory Visit: Payer: Self-pay

## 2019-08-22 ENCOUNTER — Inpatient Hospital Stay: Payer: No Typology Code available for payment source

## 2019-08-22 VITALS — BP 110/66 | HR 63 | Temp 97.9°F | Resp 16

## 2019-08-22 DIAGNOSIS — C8299 Follicular lymphoma, unspecified, extranodal and solid organ sites: Secondary | ICD-10-CM

## 2019-08-22 DIAGNOSIS — Z5112 Encounter for antineoplastic immunotherapy: Secondary | ICD-10-CM | POA: Diagnosis not present

## 2019-08-22 MED ORDER — PEGFILGRASTIM-JMDB 6 MG/0.6ML ~~LOC~~ SOSY
PREFILLED_SYRINGE | SUBCUTANEOUS | Status: AC
  Filled 2019-08-22: qty 0.6

## 2019-08-22 MED ORDER — PEGFILGRASTIM-JMDB 6 MG/0.6ML ~~LOC~~ SOSY
6.0000 mg | PREFILLED_SYRINGE | Freq: Once | SUBCUTANEOUS | Status: AC
  Administered 2019-08-22: 6 mg via SUBCUTANEOUS

## 2019-08-22 NOTE — Patient Instructions (Signed)

## 2019-08-23 ENCOUNTER — Other Ambulatory Visit: Payer: Self-pay | Admitting: Cardiology

## 2019-08-23 ENCOUNTER — Telehealth: Payer: Self-pay | Admitting: Cardiology

## 2019-08-23 DIAGNOSIS — Z79899 Other long term (current) drug therapy: Secondary | ICD-10-CM

## 2019-08-23 NOTE — Telephone Encounter (Signed)
° ° °  Received call from patients wife stating that the patient is having worsening LE edema. He was seen by his oncologist and discussed this at which time they were referred to cardiology for adjustments. He denies SOB or chest pain symptoms. Reports legs are slightly weepy. I have asked that they increase his Lasix to 40mg  PO BID for three days then resume prior dose of 40mg  PO QD. I will place order for lab work to be performed next week to check K+ and renal function. Has follow up with Dr. Haroldine Laws 09/05/19.   Kathyrn Drown NP-C [place] Pager: [phone]

## 2019-08-26 ENCOUNTER — Telehealth: Payer: Self-pay | Admitting: Internal Medicine

## 2019-08-26 NOTE — Telephone Encounter (Signed)
Received call from patient'ss wife stating that the patient is now displaying swelling in his right forearm and hand. He denies SOB or chest pain symptoms; he denies any recent trauma or falls. His wife recently called on 08/23/19 with reports of lower extremity swelling and plans were made with cardiology to increase his diuretic regimen to Lasix 40mg  PO BID for three days then resume prior dose of 40mg  PO QD with outpatient follow-up on 09/05/19. Instructed patient and his wife to continue with this plan and conservative management with ACE wrap of his right forearm with elevation. If his symptoms have not subsided meaninfully by tomorrow, patient will call back for further advice or come to clinic for visit.

## 2019-08-27 ENCOUNTER — Telehealth: Payer: Self-pay | Admitting: Diagnostic Neuroimaging

## 2019-08-27 ENCOUNTER — Encounter: Payer: Self-pay | Admitting: Family Medicine

## 2019-08-27 ENCOUNTER — Telehealth (INDEPENDENT_AMBULATORY_CARE_PROVIDER_SITE_OTHER): Payer: No Typology Code available for payment source | Admitting: Family Medicine

## 2019-08-27 DIAGNOSIS — C8335 Diffuse large B-cell lymphoma, lymph nodes of inguinal region and lower limb: Secondary | ICD-10-CM

## 2019-08-27 DIAGNOSIS — F32A Depression, unspecified: Secondary | ICD-10-CM

## 2019-08-27 DIAGNOSIS — G40109 Localization-related (focal) (partial) symptomatic epilepsy and epileptic syndromes with simple partial seizures, not intractable, without status epilepticus: Secondary | ICD-10-CM

## 2019-08-27 DIAGNOSIS — F329 Major depressive disorder, single episode, unspecified: Secondary | ICD-10-CM | POA: Diagnosis not present

## 2019-08-27 MED ORDER — LAMOTRIGINE 100 MG PO TABS: 100.0000 mg | ORAL_TABLET | Freq: Two times a day (BID) | ORAL | 3 refills | Status: AC

## 2019-08-27 MED ORDER — SERTRALINE HCL 25 MG PO TABS: 25.0000 mg | ORAL_TABLET | Freq: Every day | ORAL | 3 refills | Status: AC

## 2019-08-27 MED ORDER — LEVETIRACETAM 750 MG PO TABS: 750.0000 mg | ORAL_TABLET | Freq: Two times a day (BID) | ORAL | 3 refills | Status: AC

## 2019-08-27 NOTE — Progress Notes (Signed)
PATIENT: Tony Saunders DOB: June 18, 1955  REASON FOR VISIT: follow up HISTORY FROM: patient  Virtual Visit via Telephone Note  I connected with Tony Saunders on 08/27/19 at  1:30 PM EDT by telephone and verified that I am speaking with the correct person using two identifiers.   I discussed the limitations, risks, security and privacy concerns of performing an evaluation and management service by telephone and the availability of in person appointments. I also discussed with the patient that there may be a patient responsible charge related to this service. The patient expressed understanding and agreed to proceed.   History of Present Illness:  08/27/19 Tony Saunders is a 64 y.o. male here today for follow up for seizures. He continues levetiracetam 750mg  and lamotrigine 100mg  BID. No recent seizure activity. Mood is stable on sertraline 25mg  daily. He has an extensive heart history followed by cardiology. 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 He has completed 10 radiation treatments, 4 chemo treatments and has two chemo treatments remaining.     History (copied from Tony Saunders last note)  History of Present Illness:  - patient is doing well - no seizures; tolerating meds - tremor is improved - mood is stable   Observations/Objective:  - awake and alert - face symm - no dysarthria - no tremor    Assessment and Plan:  SEIZURE DISORDER - continue lamotrigine 100mg  twice a day - continue levetiracetam 750mg  twice a day - check CBC, CMP with PCP annually  AGITATION / MOOD DISTURBANCE - continue sertraline 25mg  daily  ESSENTIAL TREMOR - in future may consider trial of essential tremor medication; would consider propranolol (which can help with BP and tremor)  Observations/Objective:  Generalized: Well developed, in no acute distress  Mentation: Alert  oriented to time, place, history taking. Follows all commands speech and language fluent   Assessment and Plan:  64 y.o. 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 here with    ICD-10-CM   1. 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 Depression, unspecified depression type  F32.9   3. 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 We will continue sertraline 25mg  daily for depression and mood changes due to seizures. Seizure precautions advised. He will continue close follow up with PCP, cardiology and oncology. Labs from 08/20/2019 reviewed for today's visit. Follow up advised in 1 year.   No orders of the defined types were placed in this encounter.   Meds ordered this encounter  Medications  . levETIRAcetam (KEPPRA) 750 MG tablet    Sig: Take 1 tablet (750 mg total) by mouth 2 (two) times daily.    Dispense:  180 tablet    Refill:  3    Order Specific Question:   Supervising Provider    Answer:   Melvenia Beam [id number]  . lamoTRIgine (LAMICTAL) 100 MG tablet  Sig: Take 1 tablet (100 mg total) by mouth 2 (two) times daily.    Dispense:  180 tablet    Refill:  3    Order Specific Question:   Supervising Provider    Answer:   Melvenia Beam [id number]  . sertraline (ZOLOFT) 25 MG tablet    Sig: Take 1 tablet (25 mg total) by  mouth daily.    Dispense:  90 tablet    Refill:  3    Order Specific Question:   Supervising Provider    Answer:   Melvenia Beam [id number]     Follow Up Instructions:  I discussed the assessment and treatment plan with the patient. The patient was provided an opportunity to ask questions and all were answered. The patient agreed with the plan and demonstrated an understanding of the instructions.   The patient was advised to call back or seek an in-person evaluation if the symptoms worsen or if the condition fails to improve as anticipated.  I provided 15 minutes of non-face-to-face time during this encounter. Patient located at his place of residence for [place] visit. Provider is in the office.    Debbora Presto, NP

## 2019-08-27 NOTE — Telephone Encounter (Signed)
..   Pt understands that although there may be some limitations with this type of visit, we will take all precautions to reduce any security or privacy concerns.  Pt understands that this will be treated like an in office visit and we will file with pt's insurance, and there may be a patient responsible charge related to this service. ? ?

## 2019-08-28 ENCOUNTER — Other Ambulatory Visit: Payer: Self-pay

## 2019-08-28 ENCOUNTER — Encounter (HOSPITAL_COMMUNITY): Payer: Self-pay | Admitting: Emergency Medicine

## 2019-08-28 ENCOUNTER — Emergency Department (HOSPITAL_COMMUNITY): Payer: No Typology Code available for payment source

## 2019-08-28 ENCOUNTER — Inpatient Hospital Stay (HOSPITAL_COMMUNITY)
Admission: EM | Admit: 2019-08-28 | Discharge: 2019-09-09 | DRG: 808 | Disposition: A | Discharge status: Home / Self Care | Payer: No Typology Code available for payment source | Attending: Internal Medicine | Admitting: Internal Medicine

## 2019-08-28 DIAGNOSIS — Z7902 Long term (current) use of antithrombotics/antiplatelets: Secondary | ICD-10-CM

## 2019-08-28 DIAGNOSIS — D701 Agranulocytosis secondary to cancer chemotherapy: Principal | ICD-10-CM | POA: Diagnosis present

## 2019-08-28 DIAGNOSIS — Z95828 Presence of other vascular implants and grafts: Secondary | ICD-10-CM

## 2019-08-28 DIAGNOSIS — J9601 Acute respiratory failure with hypoxia: Secondary | ICD-10-CM | POA: Diagnosis present

## 2019-08-28 DIAGNOSIS — G40909 Epilepsy, unspecified, not intractable, without status epilepticus: Secondary | ICD-10-CM | POA: Diagnosis present

## 2019-08-28 DIAGNOSIS — R042 Hemoptysis: Secondary | ICD-10-CM | POA: Diagnosis present

## 2019-08-28 DIAGNOSIS — R609 Edema, unspecified: Secondary | ICD-10-CM | POA: Diagnosis not present

## 2019-08-28 DIAGNOSIS — J189 Pneumonia, unspecified organism: Secondary | ICD-10-CM

## 2019-08-28 DIAGNOSIS — I2510 Atherosclerotic heart disease of native coronary artery without angina pectoris: Secondary | ICD-10-CM | POA: Diagnosis present

## 2019-08-28 DIAGNOSIS — R54 Age-related physical debility: Secondary | ICD-10-CM | POA: Diagnosis present

## 2019-08-28 DIAGNOSIS — D6859 Other primary thrombophilia: Secondary | ICD-10-CM | POA: Diagnosis present

## 2019-08-28 DIAGNOSIS — I4892 Unspecified atrial flutter: Secondary | ICD-10-CM | POA: Diagnosis present

## 2019-08-28 DIAGNOSIS — I5043 Acute on chronic combined systolic (congestive) and diastolic (congestive) heart failure: Secondary | ICD-10-CM | POA: Diagnosis present

## 2019-08-28 DIAGNOSIS — Z20822 Contact with and (suspected) exposure to covid-19: Secondary | ICD-10-CM | POA: Diagnosis present

## 2019-08-28 DIAGNOSIS — E43 Unspecified severe protein-calorie malnutrition: Secondary | ICD-10-CM | POA: Diagnosis present

## 2019-08-28 DIAGNOSIS — Z79899 Other long term (current) drug therapy: Secondary | ICD-10-CM

## 2019-08-28 DIAGNOSIS — R531 Weakness: Secondary | ICD-10-CM

## 2019-08-28 DIAGNOSIS — R040 Epistaxis: Secondary | ICD-10-CM | POA: Diagnosis present

## 2019-08-28 DIAGNOSIS — D709 Neutropenia, unspecified: Secondary | ICD-10-CM | POA: Diagnosis present

## 2019-08-28 DIAGNOSIS — C8335 Diffuse large B-cell lymphoma, lymph nodes of inguinal region and lower limb: Secondary | ICD-10-CM | POA: Diagnosis present

## 2019-08-28 DIAGNOSIS — J918 Pleural effusion in other conditions classified elsewhere: Secondary | ICD-10-CM | POA: Diagnosis present

## 2019-08-28 DIAGNOSIS — I82409 Acute embolism and thrombosis of unspecified deep veins of unspecified lower extremity: Secondary | ICD-10-CM | POA: Diagnosis present

## 2019-08-28 DIAGNOSIS — Z955 Presence of coronary angioplasty implant and graft: Secondary | ICD-10-CM

## 2019-08-28 DIAGNOSIS — I959 Hypotension, unspecified: Secondary | ICD-10-CM | POA: Diagnosis present

## 2019-08-28 DIAGNOSIS — I255 Ischemic cardiomyopathy: Secondary | ICD-10-CM | POA: Diagnosis present

## 2019-08-28 DIAGNOSIS — R0603 Acute respiratory distress: Secondary | ICD-10-CM

## 2019-08-28 DIAGNOSIS — J90 Pleural effusion, not elsewhere classified: Secondary | ICD-10-CM | POA: Diagnosis present

## 2019-08-28 DIAGNOSIS — Z9889 Other specified postprocedural states: Secondary | ICD-10-CM

## 2019-08-28 DIAGNOSIS — K219 Gastro-esophageal reflux disease without esophagitis: Secondary | ICD-10-CM | POA: Diagnosis present

## 2019-08-28 DIAGNOSIS — I82621 Acute embolism and thrombosis of deep veins of right upper extremity: Secondary | ICD-10-CM | POA: Diagnosis present

## 2019-08-28 DIAGNOSIS — R5081 Fever presenting with conditions classified elsewhere: Secondary | ICD-10-CM | POA: Diagnosis present

## 2019-08-28 DIAGNOSIS — Z6822 Body mass index (BMI) 22.0-22.9, adult: Secondary | ICD-10-CM

## 2019-08-28 DIAGNOSIS — I252 Old myocardial infarction: Secondary | ICD-10-CM

## 2019-08-28 DIAGNOSIS — Z87891 Personal history of nicotine dependence: Secondary | ICD-10-CM

## 2019-08-28 DIAGNOSIS — Z923 Personal history of irradiation: Secondary | ICD-10-CM

## 2019-08-28 DIAGNOSIS — E876 Hypokalemia: Secondary | ICD-10-CM | POA: Diagnosis present

## 2019-08-28 DIAGNOSIS — R64 Cachexia: Secondary | ICD-10-CM | POA: Diagnosis present

## 2019-08-28 DIAGNOSIS — D630 Anemia in neoplastic disease: Secondary | ICD-10-CM | POA: Diagnosis present

## 2019-08-28 DIAGNOSIS — F329 Major depressive disorder, single episode, unspecified: Secondary | ICD-10-CM | POA: Diagnosis present

## 2019-08-28 DIAGNOSIS — I480 Paroxysmal atrial fibrillation: Secondary | ICD-10-CM | POA: Diagnosis present

## 2019-08-28 DIAGNOSIS — I340 Nonrheumatic mitral (valve) insufficiency: Secondary | ICD-10-CM | POA: Diagnosis present

## 2019-08-28 DIAGNOSIS — Z8249 Family history of ischemic heart disease and other diseases of the circulatory system: Secondary | ICD-10-CM

## 2019-08-28 NOTE — ED Triage Notes (Signed)
Patient with active chemo treatments, with lower leg edema bilateral and low grade fever.  Patient with CHF and lymphoma history.  Patient was told to come to ED when fever became higher than 100.4.

## 2019-08-29 ENCOUNTER — Inpatient Hospital Stay (HOSPITAL_COMMUNITY): Payer: No Typology Code available for payment source

## 2019-08-29 ENCOUNTER — Other Ambulatory Visit: Payer: Self-pay

## 2019-08-29 DIAGNOSIS — R5081 Fever presenting with conditions classified elsewhere: Secondary | ICD-10-CM | POA: Diagnosis present

## 2019-08-29 DIAGNOSIS — Z7902 Long term (current) use of antithrombotics/antiplatelets: Secondary | ICD-10-CM | POA: Diagnosis not present

## 2019-08-29 DIAGNOSIS — D709 Neutropenia, unspecified: Secondary | ICD-10-CM | POA: Diagnosis not present

## 2019-08-29 DIAGNOSIS — I4892 Unspecified atrial flutter: Secondary | ICD-10-CM | POA: Diagnosis present

## 2019-08-29 DIAGNOSIS — Z9889 Other specified postprocedural states: Secondary | ICD-10-CM | POA: Diagnosis not present

## 2019-08-29 DIAGNOSIS — Z20822 Contact with and (suspected) exposure to covid-19: Secondary | ICD-10-CM | POA: Diagnosis present

## 2019-08-29 DIAGNOSIS — I82621 Acute embolism and thrombosis of deep veins of right upper extremity: Secondary | ICD-10-CM | POA: Diagnosis present

## 2019-08-29 DIAGNOSIS — Z95828 Presence of other vascular implants and grafts: Secondary | ICD-10-CM | POA: Diagnosis not present

## 2019-08-29 DIAGNOSIS — R0603 Acute respiratory distress: Secondary | ICD-10-CM | POA: Diagnosis not present

## 2019-08-29 DIAGNOSIS — C8335 Diffuse large B-cell lymphoma, lymph nodes of inguinal region and lower limb: Secondary | ICD-10-CM | POA: Diagnosis present

## 2019-08-29 DIAGNOSIS — I2510 Atherosclerotic heart disease of native coronary artery without angina pectoris: Secondary | ICD-10-CM | POA: Diagnosis present

## 2019-08-29 DIAGNOSIS — R609 Edema, unspecified: Secondary | ICD-10-CM | POA: Diagnosis present

## 2019-08-29 DIAGNOSIS — I5043 Acute on chronic combined systolic (congestive) and diastolic (congestive) heart failure: Secondary | ICD-10-CM | POA: Diagnosis present

## 2019-08-29 DIAGNOSIS — J9601 Acute respiratory failure with hypoxia: Secondary | ICD-10-CM | POA: Diagnosis present

## 2019-08-29 DIAGNOSIS — I255 Ischemic cardiomyopathy: Secondary | ICD-10-CM | POA: Diagnosis present

## 2019-08-29 DIAGNOSIS — E876 Hypokalemia: Secondary | ICD-10-CM | POA: Diagnosis present

## 2019-08-29 DIAGNOSIS — K219 Gastro-esophageal reflux disease without esophagitis: Secondary | ICD-10-CM | POA: Diagnosis present

## 2019-08-29 DIAGNOSIS — D701 Agranulocytosis secondary to cancer chemotherapy: Secondary | ICD-10-CM | POA: Diagnosis present

## 2019-08-29 DIAGNOSIS — Z6822 Body mass index (BMI) 22.0-22.9, adult: Secondary | ICD-10-CM | POA: Diagnosis not present

## 2019-08-29 DIAGNOSIS — J918 Pleural effusion in other conditions classified elsewhere: Secondary | ICD-10-CM | POA: Diagnosis present

## 2019-08-29 DIAGNOSIS — R042 Hemoptysis: Secondary | ICD-10-CM | POA: Diagnosis present

## 2019-08-29 DIAGNOSIS — I252 Old myocardial infarction: Secondary | ICD-10-CM | POA: Diagnosis not present

## 2019-08-29 DIAGNOSIS — D6859 Other primary thrombophilia: Secondary | ICD-10-CM | POA: Diagnosis present

## 2019-08-29 DIAGNOSIS — R64 Cachexia: Secondary | ICD-10-CM | POA: Diagnosis present

## 2019-08-29 DIAGNOSIS — I340 Nonrheumatic mitral (valve) insufficiency: Secondary | ICD-10-CM | POA: Diagnosis present

## 2019-08-29 DIAGNOSIS — F329 Major depressive disorder, single episode, unspecified: Secondary | ICD-10-CM | POA: Diagnosis present

## 2019-08-29 DIAGNOSIS — I5023 Acute on chronic systolic (congestive) heart failure: Secondary | ICD-10-CM | POA: Diagnosis not present

## 2019-08-29 DIAGNOSIS — J90 Pleural effusion, not elsewhere classified: Secondary | ICD-10-CM | POA: Diagnosis not present

## 2019-08-29 DIAGNOSIS — G40909 Epilepsy, unspecified, not intractable, without status epilepticus: Secondary | ICD-10-CM | POA: Diagnosis present

## 2019-08-29 DIAGNOSIS — E43 Unspecified severe protein-calorie malnutrition: Secondary | ICD-10-CM | POA: Diagnosis present

## 2019-08-29 DIAGNOSIS — D630 Anemia in neoplastic disease: Secondary | ICD-10-CM | POA: Diagnosis present

## 2019-08-29 LAB — URINALYSIS, ROUTINE W REFLEX MICROSCOPIC
Bacteria, UA: NONE SEEN
Bilirubin Urine: NEGATIVE
Glucose, UA: NEGATIVE mg/dL
Ketones, ur: NEGATIVE mg/dL
Leukocytes,Ua: NEGATIVE
Nitrite: NEGATIVE
Protein, ur: 100 mg/dL — AB
RBC / HPF: >50 RBC/hpf — ABNORMAL HIGH (ref 0–5)
Specific Gravity, Urine: 1.021 (ref 1.005–1.030)
pH: 5 (ref 5.0–8.0)

## 2019-08-29 LAB — URINE CULTURE: Culture: NO GROWTH

## 2019-08-29 LAB — CBC WITH DIFFERENTIAL/PLATELET
Abs Immature Granulocytes: 0.03 10*3/uL (ref 0.00–0.07)
Basophils Absolute: 0 10*3/uL (ref 0.0–0.1)
Basophils Relative: 1 %
Eosinophils Absolute: 0 10*3/uL (ref 0.0–0.5)
Eosinophils Relative: 1 %
HCT: 25.6 % — ABNORMAL LOW (ref 39.0–52.0)
Hemoglobin: 7.5 g/dL — ABNORMAL LOW (ref 13.0–17.0)
Immature Granulocytes: 3 %
Lymphocytes Relative: 10 %
Lymphs Abs: 0.1 10*3/uL — ABNORMAL LOW (ref 0.7–4.0)
MCH: 28.4 pg (ref 26.0–34.0)
MCHC: 29.3 g/dL — ABNORMAL LOW (ref 30.0–36.0)
MCV: 97 fL (ref 80.0–100.0)
Monocytes Absolute: 0.1 10*3/uL (ref 0.1–1.0)
Monocytes Relative: 8 %
Neutro Abs: 0.9 10*3/uL — ABNORMAL LOW (ref 1.7–7.7)
Neutrophils Relative %: 77 %
Platelets: 62 10*3/uL — ABNORMAL LOW (ref 150–400)
RBC: 2.64 MIL/uL — ABNORMAL LOW (ref 4.22–5.81)
RDW: 19.2 % — ABNORMAL HIGH (ref 11.5–15.5)
WBC: 1.2 10*3/uL — CL (ref 4.0–10.5)
nRBC: 0 % (ref 0.0–0.2)

## 2019-08-29 LAB — COMPREHENSIVE METABOLIC PANEL
ALT: 13 U/L (ref 0–44)
AST: 14 U/L — ABNORMAL LOW (ref 15–41)
Albumin: 2.6 g/dL — ABNORMAL LOW (ref 3.5–5.0)
Alkaline Phosphatase: 113 U/L (ref 38–126)
Anion gap: 8 (ref 5–15)
BUN: 15 mg/dL (ref 8–23)
CO2: 33 mmol/L — ABNORMAL HIGH (ref 22–32)
Calcium: 8.4 mg/dL — ABNORMAL LOW (ref 8.9–10.3)
Chloride: 96 mmol/L — ABNORMAL LOW (ref 98–111)
Creatinine, Ser: 0.93 mg/dL (ref 0.61–1.24)
GFR calc Af Amer: >60 mL/min (ref >60)
GFR calc non Af Amer: >60 mL/min (ref >60)
Glucose, Bld: 89 mg/dL (ref 70–99)
Potassium: 3.8 mmol/L (ref 3.5–5.1)
Sodium: 137 mmol/L (ref 135–145)
Total Bilirubin: 0.8 mg/dL (ref 0.3–1.2)
Total Protein: 5.1 g/dL — ABNORMAL LOW (ref 6.5–8.1)

## 2019-08-29 LAB — SARS CORONAVIRUS 2 BY RT PCR (HOSPITAL ORDER, PERFORMED IN ~~LOC~~ HOSPITAL LAB): SARS Coronavirus 2: NEGATIVE

## 2019-08-29 LAB — LACTIC ACID, PLASMA
Lactic Acid, Venous: 2.1 mmol/L (ref 0.5–1.9)
Lactic Acid, Venous: 1.1 mmol/L (ref 0.5–1.9)

## 2019-08-29 MED ORDER — MORPHINE SULFATE (PF) 2 MG/ML IV SOLN: 2.0000 mg | INTRAVENOUS | Status: DC | PRN

## 2019-08-29 MED ORDER — LAMOTRIGINE 100 MG PO TABS
100.0000 mg | ORAL_TABLET | Freq: Two times a day (BID) | ORAL | Status: DC
  Administered 2019-08-29 – 2019-09-09 (×22): 100 mg via ORAL
  Filled 2019-08-29 (×25): qty 1

## 2019-08-29 MED ORDER — MIRTAZAPINE 15 MG PO TBDP: 15.0000 mg | ORAL_TABLET | Freq: Every day | ORAL | Status: DC

## 2019-08-29 MED ORDER — ACETAMINOPHEN 325 MG PO TABS: 650.0000 mg | ORAL_TABLET | Freq: Four times a day (QID) | ORAL | Status: DC | PRN

## 2019-08-29 MED ORDER — BUDESONIDE 0.5 MG/2ML IN SUSP
0.5000 mg | Freq: Two times a day (BID) | RESPIRATORY_TRACT | Status: DC
  Administered 2019-08-29 – 2019-09-09 (×23): 0.5 mg via RESPIRATORY_TRACT
  Filled 2019-08-29 (×25): qty 2

## 2019-08-29 MED ORDER — SODIUM CHLORIDE 0.9 % IV SOLN
2.0000 g | Freq: Three times a day (TID) | INTRAVENOUS | Status: AC
  Administered 2019-08-29 – 2019-08-31 (×8): 2 g via INTRAVENOUS
  Filled 2019-08-29 (×8): qty 2

## 2019-08-29 MED ORDER — ACETAMINOPHEN 650 MG RE SUPP: 650.0000 mg | Freq: Four times a day (QID) | RECTAL | Status: DC | PRN

## 2019-08-29 MED ORDER — AMIODARONE HCL 200 MG PO TABS
200.0000 mg | ORAL_TABLET | Freq: Every day | ORAL | Status: DC
  Administered 2019-08-29 – 2019-09-09 (×12): 200 mg via ORAL
  Filled 2019-08-29 (×12): qty 1

## 2019-08-29 MED ORDER — DIGOXIN 125 MCG PO TABS
125.0000 ug | ORAL_TABLET | Freq: Every day | ORAL | Status: DC
  Administered 2019-08-29 – 2019-09-07 (×10): 125 ug via ORAL
  Filled 2019-08-29 (×9): qty 1

## 2019-08-29 MED ORDER — VANCOMYCIN HCL IN DEXTROSE 1-5 GM/200ML-% IV SOLN
1000.0000 mg | Freq: Three times a day (TID) | INTRAVENOUS | Status: AC
  Administered 2019-08-29 – 2019-08-31 (×8): 1000 mg via INTRAVENOUS
  Filled 2019-08-29 (×8): qty 200

## 2019-08-29 MED ORDER — SODIUM CHLORIDE 0.9 % IV SOLN
2.0000 g | Freq: Once | INTRAVENOUS | Status: AC
  Administered 2019-08-29: 2 g via INTRAVENOUS
  Filled 2019-08-29: qty 2

## 2019-08-29 MED ORDER — VANCOMYCIN HCL 750 MG/150ML IV SOLN
750.0000 mg | Freq: Once | INTRAVENOUS | Status: AC
  Administered 2019-08-29: 750 mg via INTRAVENOUS
  Filled 2019-08-29: qty 150

## 2019-08-29 MED ORDER — POLYETHYLENE GLYCOL 3350 17 G PO PACK: 17.0000 g | PACK | Freq: Every day | ORAL | Status: DC | PRN

## 2019-08-29 MED ORDER — OXYCODONE HCL 5 MG PO TABS: 5.0000 mg | ORAL_TABLET | ORAL | Status: DC | PRN

## 2019-08-29 MED ORDER — CLOPIDOGREL BISULFATE 75 MG PO TABS
75.0000 mg | ORAL_TABLET | Freq: Every day | ORAL | Status: DC
  Administered 2019-08-29 – 2019-09-06 (×9): 75 mg via ORAL
  Filled 2019-08-29 (×9): qty 1

## 2019-08-29 MED ORDER — HEPARIN SODIUM (PORCINE) 5000 UNIT/ML IJ SOLN
5000.0000 [IU] | Freq: Three times a day (TID) | INTRAMUSCULAR | Status: DC
  Administered 2019-08-29 (×2): 5000 [IU] via SUBCUTANEOUS
  Filled 2019-08-29 (×2): qty 1

## 2019-08-29 MED ORDER — SODIUM CHLORIDE 0.9 % IV SOLN: 1.0000 g | Freq: Three times a day (TID) | INTRAVENOUS | Status: DC

## 2019-08-29 MED ORDER — CHLORHEXIDINE GLUCONATE CLOTH 2 % EX PADS
6.0000 | MEDICATED_PAD | Freq: Every day | CUTANEOUS | Status: DC
  Administered 2019-08-29 – 2019-09-09 (×8): 6 via TOPICAL

## 2019-08-29 MED ORDER — ENSURE ENLIVE PO LIQD
237.0000 mL | Freq: Two times a day (BID) | ORAL | Status: DC
  Filled 2019-08-29: qty 237

## 2019-08-29 MED ORDER — ENOXAPARIN SODIUM 100 MG/ML ~~LOC~~ SOLN
90.0000 mg | Freq: Two times a day (BID) | SUBCUTANEOUS | Status: DC
  Administered 2019-08-29 – 2019-09-04 (×9): 90 mg via SUBCUTANEOUS
  Filled 2019-08-29 (×13): qty 0.9

## 2019-08-29 MED ORDER — VANCOMYCIN HCL IN DEXTROSE 1-5 GM/200ML-% IV SOLN: 1000.0000 mg | Freq: Two times a day (BID) | INTRAVENOUS | Status: DC

## 2019-08-29 MED ORDER — IOHEXOL 350 MG/ML SOLN
55.0000 mL | Freq: Once | INTRAVENOUS | Status: AC | PRN
  Administered 2019-08-29: 55 mL via INTRAVENOUS

## 2019-08-29 MED ORDER — PANTOPRAZOLE SODIUM 40 MG PO TBEC
40.0000 mg | DELAYED_RELEASE_TABLET | Freq: Every day | ORAL | Status: DC
  Administered 2019-08-29 – 2019-09-09 (×12): 40 mg via ORAL
  Filled 2019-08-29 (×12): qty 1

## 2019-08-29 MED ORDER — LEVETIRACETAM 750 MG PO TABS
750.0000 mg | ORAL_TABLET | Freq: Two times a day (BID) | ORAL | Status: DC
  Administered 2019-08-29 – 2019-08-31 (×5): 750 mg via ORAL
  Filled 2019-08-29 (×7): qty 1

## 2019-08-29 MED ORDER — PROCHLORPERAZINE MALEATE 10 MG PO TABS
10.0000 mg | ORAL_TABLET | Freq: Four times a day (QID) | ORAL | Status: DC | PRN
  Filled 2019-08-29: qty 1

## 2019-08-29 MED ORDER — VANCOMYCIN HCL IN DEXTROSE 1-5 GM/200ML-% IV SOLN
1000.0000 mg | Freq: Once | INTRAVENOUS | Status: AC
  Administered 2019-08-29: 1000 mg via INTRAVENOUS
  Filled 2019-08-29 (×2): qty 200

## 2019-08-29 MED ORDER — MIDODRINE HCL 5 MG PO TABS
5.0000 mg | ORAL_TABLET | Freq: Three times a day (TID) | ORAL | Status: DC
  Administered 2019-08-29 – 2019-08-30 (×4): 5 mg via ORAL
  Filled 2019-08-29 (×4): qty 1

## 2019-08-29 MED ORDER — SERTRALINE HCL 25 MG PO TABS
25.0000 mg | ORAL_TABLET | Freq: Every day | ORAL | Status: DC
  Administered 2019-08-29 – 2019-09-09 (×12): 25 mg via ORAL
  Filled 2019-08-29 (×13): qty 1

## 2019-08-29 NOTE — Progress Notes (Signed)
Pharmacy Antibiotic Note  Tony Saunders is a 64 y.o. male admitted on 08/28/2019 with febrile neutropenia.  Pharmacy has been consulted for vancomycin dosing. Pt is currently afebrile. WBC is low at 1.2. SCr is WNL and lactic acid has normalized.  Plan: Change vancomycin 1gm IV Q8H F/u renal fxn, C&S, clinical status    Temp (24hrs), Avg:99.7 F (37.6 C), Min:99.7 F (37.6 C), Max:99.7 F (37.6 C)  Recent Labs  Lab 08/28/19 2312 08/29/19 0219  WBC 1.2*  --   CREATININE 0.93  --   LATICACIDVEN 2.1* 1.1    Estimated Creatinine Clearance: 90.4 mL/min (by C-G formula based on SCr of 0.93 mg/dL).    No Known Allergies  Antimicrobials this admission: Vanc 8/11>> Cefepime 8/11>>  Dose adjustments this admission: N/A  Microbiology results: Pending  Thank you for allowing pharmacy to be a part of this patient's care.  Tony Saunders, Tony Saunders 08/29/2019 8:22 AM
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
## 2019-08-29 NOTE — Progress Notes (Signed)
ANTICOAGULATION CONSULT NOTE - Initial Consult  Pharmacy Consult for enoxaparin Indication: DVT  No Known Allergies  Patient Measurements: Weight 88.5 kg (195 lb 3 oz) Height 6' (182.9 cm)  Vital Signs:    Labs: Recent Labs    08/28/19 2312  HGB 7.5*  HCT 25.6*  PLT 62*  CREATININE 0.93    Estimated Creatinine Clearance: 90.4 mL/min (by C-G formula based on SCr of 0.93 mg/dL).   Medical History: Past Medical History:  Diagnosis Date  . 3vessel CAD- S/P PCI 03/27/2019   Remote pRCA PCI-stenting 2006. Acute MI 03/27/2019 treated with urgent m-d RCA PCI and stent (x 2) followed by staged PCI DES to CFX (x2 stents) and LAD (x1) on 03/29/2019  . Acute ST elevation myocardial infarction (STEMI) of inferior wall ([place]) 03/27/2019   Pt presented 03/27/2019 with an acute inferior MI- Cath 03/27/19 showed thrombotic occlusion of mid-distal RCA with a patent previously placed pRCA stent -2 overlapping Synergy DES from PDA back into proximal RCA stented segment and PTCA of RPAV (jailed)-PL 3 He also had concomitant high garde CFX and LAD disease (staged PCI) with an EF of 30-35%  . Chronic combined systolic and diastolic CHF, NYHA class 2 and ACC/AHA stage C ([place]) 03/27/2019   EF 30 to 40% with diffuse inferior hypokinesis/akinesis; severe ischemic MR  . 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 Myocardial infarction [facility]) 2006   PCI of the RCA  . PAF (persistent-paroxysmal atrial fibrillation) ([place]) 04/16/2019   Brief- post MI  . Seizure, temporal lobe ([place]) 2017   most recent 07/23/15-on Keppra and Lamictal  . Severe mitral regurgitation by prior echocardiogram-likely ischemic with tethered posterior leaflet 03/31/2019   Repeat echo 4-6 weeks post MI    Medications:  Scheduled:  . amiodarone  200 mg Oral Daily  . budesonide  0.5 mg Nebulization BID  . clopidogrel  75 mg Oral Daily  . digoxin  125 mcg Oral Daily  . enoxaparin (LOVENOX) injection   90 mg Subcutaneous Q12H  . feeding supplement (ENSURE ENLIVE)  237 mL Oral BID BM  . heparin  5,000 Units Subcutaneous Q8H  . lamoTRIgine  100 mg Oral BID  . levETIRAcetam  750 mg Oral BID  . midodrine  5 mg Oral TID WC  . pantoprazole  40 mg Oral Daily  . sertraline  25 mg Oral Daily   Infusions:  . ceFEPime (MAXIPIME) IV Stopped (08/29/19 1400)  . vancomycin 1,000 mg (08/29/19 1446)   PRN: acetaminophen **OR** acetaminophen, morphine injection, oxyCODONE, polyethylene glycol, prochlorperazine   Anti-infectives (From admission, onward)   Start     Dose/Rate Route Frequency Ordered Stop   08/29/19 1600  vancomycin (VANCOCIN) IVPB 1000 mg/200 mL premix  Status:  Discontinued        1,000 mg 200 mL/hr over 60 Minutes Intravenous Every 12 hours 08/29/19 0335 08/29/19 0821   08/29/19 1515  ceFEPIme (MAXIPIME) 1 g in sodium chloride 0.9 % 100 mL IVPB  Status:  Discontinued       Note to Pharmacy: Time from last dose please   1 g 200 mL/hr over 30 Minutes Intravenous Every 8 hours 08/29/19 1500 08/29/19 1500   08/29/19 1400  vancomycin (VANCOCIN) IVPB 1000 mg/200 mL premix     Discontinue     1,000 mg 200 mL/hr over 60 Minutes Intravenous Every 8 hours 08/29/19 0821     08/29/19 1000  ceFEPIme (MAXIPIME) 2 g in sodium chloride 0.9 %  100 mL IVPB     Discontinue     2 g 200 mL/hr over 30 Minutes Intravenous Every 8 hours 08/29/19 0820     08/29/19 0330  vancomycin (VANCOREADY) IVPB 750 mg/150 mL        750 mg 150 mL/hr over 60 Minutes Intravenous  Once 08/29/19 0329 08/29/19 0907   08/29/19 0145  ceFEPIme (MAXIPIME) 2 g in sodium chloride 0.9 % 100 mL IVPB        2 g 200 mL/hr over 30 Minutes Intravenous  Once 08/29/19 0132 08/29/19 0259   08/29/19 0145  vancomycin (VANCOCIN) IVPB 1000 mg/200 mL premix        1,000 mg 200 mL/hr over 60 Minutes Intravenous  Once 08/29/19 0132 08/29/19 0400      Assessment: 64 yo male with a history of lymphoma presents with shortness of breath and  fever. The patient was found to have neutropenia and a fever. The patient has a venous duplex that was found to be positive for acute deep vein thrombosis involving the right subclavian vein, 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 The CTA was negative for pulmonary embolism. PTA the patient is not taking any anticoagulation.  Pharmacy is now consulted to dose therapeutic enoxaparin for DVT. The patients Hgb/Hct are 7.5 and 25.6, respectively and the patient's creatinine is stable. There are no signs or symptoms of bleeding documented. Will continue to monitor the patient.  Goal of Therapy:  Anti-Xa level 0.6-1 units/ml 4hrs after LMWH dose given Monitor platelets by anticoagulation protocol: Yes   Plan:  Initiate enoxaparin 90 mg SubQ BID Monitor CBC and consider ordering anti-xa level as clinically indicated Monitor for signs and symptoms of bleeding  Shauna Hugh, PharmD, [place]  PGY-1 Pharmacy Resident 08/29/2019 3:25 PM  Please check [email or web address] for unit-specific pharmacy phone numbers.

## 2019-08-29 NOTE — Progress Notes (Signed)
Events noted.  Patient known known to me with history of lymphoma has received last cycle of chemotherapy on August 2.  He had presented with shortness of breath and fever and was found to have neutropenia with absolute neutrophil count of 900.  He was started on broad-spectrum antibiotics.  His vital since his hospitalization showed that he is afebrile with borderline blood pressure and mild tachycardia.  Laboratory data reviewed showed a hemoglobin of 7.5, white cell count 1.2 platelet count of 62,000.   Recommendations:  I agree with the current management with broad-spectrum antibiotics and await cultures.  If he is afebrile and his absolute neutrophil count recovers, it  would be reasonable to discontinue antibiotics in 24 to 48 hours.  He does not require any blood product transfusion at this time.  I anticipate recovery of his counts in the next few days.  He did receive growth factor support after the last cycle of chemotherapy.  We will continue to follow his progress.

## 2019-08-29 NOTE — ED Notes (Signed)
Lunch tray given , states he is feeling a little better.

## 2019-08-29 NOTE — ED Notes (Signed)
IV team at bedside 
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
## 2019-08-29 NOTE — Progress Notes (Addendum)
Patient admitted after midnight, please see H&P from Dr. Clearence Ped.   Acute hypoxic respiratory failure 1. Requiring 3 L nasal cannula 2. Denies cough and dyspnea 3. Chest x-ray shows decreased bilateral pleural effusions -improved from last film 4. Broad-spectrum antibiotics will cover any possible pneumonia 5. With current diagnosis of lymphoma, borderline hypotension blood pressure at 103/66, CT angio of chest: Stable large bilateral pleural effusions and diffuse ground-glass attenuation. Findings are consistent with congestive heart failure. Last echo 6/21: EF 35-40%.  During prior hospitalization required milrinone.    Neutropenic fever 1. White blood cell count 1.2 2. T-max at home 101.7 3. Oncology consulted and recommended cefepime and vancomycin 4. No source of infection identified at this time 5. Covid negative, blood cultures pending, urine culture pending, continue monitor  Right upper extremity DVT- port a cath placed 7/26 1. Duplex shows acute clot 2. Will start lovenox for now- will discuss with oncology options for home-- was previously on xarelto but this was stopped in in June due to anemia and epistaxis  Protein calorie malnutrition 1. Feeding supplement   Patient appears acutely ill.  Will place in progressive care bed. Poor overall prognosis Updated wife on telephone Eulogio Bear

## 2019-08-29 NOTE — Progress Notes (Signed)
Pt ordered both SQ lovenox and heparin. Orders given to administer both as prophylaxis. After administration pharmacy notified me of an error made in the [facility]. Heparin should have been d/c by the medical provider. Pt has been assessed for bleeding and will continue to monitor closely.

## 2019-08-29 NOTE — Progress Notes (Signed)
Pharmacy Antibiotic Note  Tony Saunders is a 64 y.o. male admitted on 08/28/2019 with febrile neutropenia.  Pharmacy has been consulted for vancomycin dosing. Vancomycin 1000 mg ordered in ED.  Plan: Will give additional 750 mg IV x 1 then 1gm IV q12 hours F/u renal function, cultures and clinical course     Temp (24hrs), Avg:99.7 F (37.6 C), Min:99.7 F (37.6 C), Max:99.7 F (37.6 C)  Recent Labs  Lab 08/28/19 2312  WBC 1.2*  CREATININE 0.93  LATICACIDVEN 2.1*    Estimated Creatinine Clearance: 90.4 mL/min (by C-G formula based on SCr of 0.93 mg/dL).    No Known Allergies  Thank you for allowing pharmacy to be a part of this patient's care.  Excell Seltzer Poteet 08/29/2019 3:30 AM

## 2019-08-29 NOTE — ED Notes (Signed)
Patient placed in hospital bed

## 2019-08-30 ENCOUNTER — Encounter (HOSPITAL_COMMUNITY): Payer: No Typology Code available for payment source | Admitting: Internal Medicine

## 2019-08-30 DIAGNOSIS — I82409 Acute embolism and thrombosis of unspecified deep veins of unspecified lower extremity: Secondary | ICD-10-CM | POA: Diagnosis present

## 2019-08-30 LAB — CBC WITH DIFFERENTIAL/PLATELET
Abs Immature Granulocytes: 0.03 10*3/uL (ref 0.00–0.07)
Basophils Absolute: 0 10*3/uL (ref 0.0–0.1)
Basophils Relative: 2 %
Eosinophils Absolute: 0 10*3/uL (ref 0.0–0.5)
Eosinophils Relative: 1 %
HCT: 21.2 % — ABNORMAL LOW (ref 39.0–52.0)
Hemoglobin: 6.4 g/dL — CL (ref 13.0–17.0)
Immature Granulocytes: 2 %
Lymphocytes Relative: 5 %
Lymphs Abs: 0.1 10*3/uL — ABNORMAL LOW (ref 0.7–4.0)
MCH: 28.8 pg (ref 26.0–34.0)
MCHC: 30.2 g/dL (ref 30.0–36.0)
MCV: 95.5 fL (ref 80.0–100.0)
Monocytes Absolute: 0.2 10*3/uL (ref 0.1–1.0)
Monocytes Relative: 8 %
Neutro Abs: 1.6 10*3/uL — ABNORMAL LOW (ref 1.7–7.7)
Neutrophils Relative %: 82 %
Platelets: 48 10*3/uL — ABNORMAL LOW (ref 150–400)
RBC: 2.22 MIL/uL — ABNORMAL LOW (ref 4.22–5.81)
RDW: 18.7 % — ABNORMAL HIGH (ref 11.5–15.5)
WBC: 2 10*3/uL — ABNORMAL LOW (ref 4.0–10.5)
nRBC: 0 % (ref 0.0–0.2)

## 2019-08-30 LAB — MAGNESIUM: Magnesium: 1.6 mg/dL — ABNORMAL LOW (ref 1.7–2.4)

## 2019-08-30 LAB — COMPREHENSIVE METABOLIC PANEL
ALT: 13 U/L (ref 0–44)
AST: 12 U/L — ABNORMAL LOW (ref 15–41)
Albumin: 2 g/dL — ABNORMAL LOW (ref 3.5–5.0)
Alkaline Phosphatase: 77 U/L (ref 38–126)
Anion gap: 9 (ref 5–15)
BUN: 17 mg/dL (ref 8–23)
CO2: 29 mmol/L (ref 22–32)
Calcium: 7.8 mg/dL — ABNORMAL LOW (ref 8.9–10.3)
Chloride: 96 mmol/L — ABNORMAL LOW (ref 98–111)
Creatinine, Ser: 0.84 mg/dL (ref 0.61–1.24)
GFR calc Af Amer: >60 mL/min (ref >60)
GFR calc non Af Amer: >60 mL/min (ref >60)
Glucose, Bld: 84 mg/dL (ref 70–99)
Potassium: 3.7 mmol/L (ref 3.5–5.1)
Sodium: 134 mmol/L — ABNORMAL LOW (ref 135–145)
Total Bilirubin: 0.8 mg/dL (ref 0.3–1.2)
Total Protein: 4.2 g/dL — ABNORMAL LOW (ref 6.5–8.1)

## 2019-08-30 LAB — PREPARE RBC (CROSSMATCH)

## 2019-08-30 MED ORDER — FUROSEMIDE 10 MG/ML IJ SOLN
20.0000 mg | Freq: Once | INTRAMUSCULAR | Status: AC
  Administered 2019-08-30: 20 mg via INTRAVENOUS
  Filled 2019-08-30: qty 2

## 2019-08-30 MED ORDER — SODIUM CHLORIDE 0.9% IV SOLUTION: Freq: Once | INTRAVENOUS | Status: DC

## 2019-08-30 MED ORDER — MAGNESIUM SULFATE 2 GM/50ML IV SOLN
2.0000 g | Freq: Once | INTRAVENOUS | Status: AC
  Administered 2019-08-30: 2 g via INTRAVENOUS
  Filled 2019-08-30: qty 50

## 2019-08-30 MED ORDER — LOPERAMIDE HCL 2 MG PO CAPS
2.0000 mg | ORAL_CAPSULE | ORAL | Status: DC | PRN
  Administered 2019-08-30 – 2019-09-08 (×5): 2 mg via ORAL
  Filled 2019-08-30 (×5): qty 1

## 2019-08-30 MED ORDER — PROSOURCE PLUS PO LIQD
30.0000 mL | Freq: Two times a day (BID) | ORAL | Status: DC
  Administered 2019-08-30 – 2019-09-09 (×14): 30 mL via ORAL
  Filled 2019-08-30 (×16): qty 30

## 2019-08-30 MED ORDER — IPRATROPIUM-ALBUTEROL 0.5-2.5 (3) MG/3ML IN SOLN
3.0000 mL | Freq: Four times a day (QID) | RESPIRATORY_TRACT | Status: DC
  Administered 2019-08-30 (×2): 3 mL via RESPIRATORY_TRACT
  Filled 2019-08-30 (×2): qty 3

## 2019-08-30 MED ORDER — OXYMETAZOLINE HCL 0.05 % NA SOLN
3.0000 | Freq: Two times a day (BID) | NASAL | Status: DC | PRN
  Administered 2019-08-30: 3 via NASAL
  Filled 2019-08-30: qty 30

## 2019-08-30 MED ORDER — GUAIFENESIN ER 600 MG PO TB12
600.0000 mg | ORAL_TABLET | Freq: Two times a day (BID) | ORAL | Status: DC
  Administered 2019-08-30 – 2019-09-09 (×20): 600 mg via ORAL
  Filled 2019-08-30 (×20): qty 1

## 2019-08-30 MED ORDER — MAGNESIUM SULFATE 50 % IJ SOLN: 2.0000 g | Freq: Once | INTRAMUSCULAR | Status: DC

## 2019-08-30 MED ORDER — ENSURE ENLIVE PO LIQD
237.0000 mL | Freq: Two times a day (BID) | ORAL | Status: DC
  Administered 2019-08-30 – 2019-09-05 (×8): 237 mL via ORAL

## 2019-08-30 NOTE — Progress Notes (Addendum)
Patient mews fired yellow. Patient says that his b/p usually runs on the lower side. No acute changes. Will continue to monitor. No s/s of low blood pressure at this time.   2329- Patient mews fired yellow again. Patient b/p and resp is not an acute change.

## 2019-08-30 NOTE — Consult Note (Addendum)
[facility] Nurse Consult Note: Reason for Consult: Consult requested for bilat buttocks.  Pt has very thin fragile skin and multiple areas of skin tears, weeping and dark purple red bruises to bilat arms and legs and generalized edema. Pt states his wife has been using Medihoney on the wounds prior to admission and they are improving.  Informed him that we do not have this topical treatment on the [place], but pt states she will bring these dressings in for use while in the hospital.  Wound type: Left buttock with unstageable pressure injury; 1.5X1.5X.1cm, 100% yellow slough.  Scattered red moist partial thickness skin loss areas to bilat buttocks; appearance is consistent with moisture associated skin damage.  Pressure Injury POA: Yes Dressing procedure/placement/frequency: Topical treatment orders provided for bedside nurses to promote healing as follows: Wife will bring in [place] dressings from home; wife or bedside nurse can apply to left buttock, bilat buttocks, or arm wounds Q day or as desired. Cover with foam dressings and change foam dressings Q 3 days or PRN soiling. Apply xeroform gauze and foam dressing to weeping skin tears on arms or legs.  Change foam dressings Q 3 days or PRN soiling. Elevate bilat heels with Prevalon boots. Please re-consult if further assistance is needed.  Thank-you,  Julien Girt MSN, [place], [place], [place], [employer]

## 2019-08-30 NOTE — Progress Notes (Signed)
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  GOAL:   Patient will meet greater than or equal to 90% of their needs  MONITOR:   PO intake, Supplement acceptance, Labs, Weight trends, Skin, I & O's  REASON FOR ASSESSMENT:   Malnutrition Screening Tool    ASSESSMENT:   64 year old male who presented on 8/10 with fever. PMH of CAD, CHF, diffuse large B cell lymphoma currently on chemotherapy (last cycle on 08/20/19), severe mitral regurgitation, seizures, atrial fibrillation, MI.   Per notes, bilateral pleural effusions improved.  Spoke with pt at bedside. RN in room providing nursing care.  Pt reports appetite today is good and that he ate well at breakfast today (pancakes and sausage). Pt reports that he does not like the food at the hospital and that his wife was bringing him in food during previous admissions (fruit, sub sandwiches). Noted donuts in pt's room at time of visit. Discussed with pt that his wife can continue to bring in food items as desired.  Discussed importance of adequate PO intake with a focus on protein-rich foods. Pt with a 100% completed Ensure Enlive at bedside. Pt reports that he is willing to drink 2 Ensure Enlive supplements daily. Pt has had ProSource Plus before and does not particularly like it but is willing to take it this admission. RD to order.  Pt  on a Heart Healthy diet. Discussed diet liberalization with MD who agreed. Verbal with readback order placed for Regular diet.  It is difficult to determine pt's dry weight at this time given severe edema. Reviewed weight encounters in chart. Pt's weight has fluctuated between 48 kg (question accuracy of this weight) and current weight of 93.6 kg over the last 5 months. RD will continue to monitor weight trends.  Pt reports that his UBW prior to any weight loss was 180-185 lbs. He confirms that his weight has been fluctuating related to fluid status.  Pt denies any N/V at this time. He also denies any issues chewing or swallowing. Pt reports that his energy levels are lower today but that earlier this week he was up and walking at home.  No meal completions documented at this time.  Medications reviewed and include: Ensure Enlive BID, protonix, IV abx  Labs reviewed: sodium 134, magnesium 1.6, hemoglobin 6.4  NUTRITION - FOCUSED PHYSICAL EXAM:    Most Recent Value  Orbital Region Moderate depletion  Upper Arm Region Moderate depletion  Thoracic and Lumbar Region Severe depletion  Buccal Region Severe depletion  Temple Region Severe depletion  Clavicle Bone Region Severe depletion  Clavicle and Acromion Bone Region Severe depletion  Scapular Bone Region Severe depletion  Dorsal Hand Moderate depletion  Patellar Region Moderate depletion  [more significant depletions likely masked by edema]  Anterior Thigh Region Moderate depletion  Posterior Calf Region Moderate depletion  Edema (RD Assessment) Severe  [BLE, RUE]  Hair Reviewed  Eyes Reviewed  Mouth Reviewed  Skin Reviewed  [scattered bruising]  Nails Reviewed       Diet Order:   Diet Order            Diet regular Room service appropriate? Yes; Fluid consistency: Thin  Diet effective now                 EDUCATION NEEDS:   Education needs have been addressed  Skin:  Skin Assessment: Skin Integrity  Issues: Unstageable: left buttocks  Last BM:  no documented BM  Height:   Ht Readings from Last 1 Encounters:  08/20/19 6' (1.829 m)    Weight:   Wt Readings from Last 1 Encounters:  08/30/19 93.6 kg    Ideal Body Weight:  80.9 kg  BMI:  Body mass index is 27.99 kg/m.  Estimated Nutritional Needs:   Kcal:  2200-2400  Protein:  110-130 grams  Fluid:  1.8 L/day    Gaynell Face, MS, RD, LDN Inpatient Clinical Dietitian Please see AMiON for contact information.

## 2019-08-30 NOTE — TOC Benefit Eligibility Note (Signed)
Transition of Care [facility] Benefit Eligibility Note    Patient Details  Name: Tony Saunders MRN: [id number] Date of Birth: 1955-03-23   Medication/Dose: Alveda Reasons 15 MG BID  CO-PAY- $966.33     and   XARELTO 20 MG DAILY CO-PAY- $ 484.67  Covered?: Yes  Tier:  (ALL ARE PREFERRED)  Prescription Coverage Preferred Pharmacy: [facility]  Spoke with Person/Company/Phone Number:: TOPHER  @ [employer] # [phone]  Co-Pay: $ 966.33  Prior Approval: No  Deductible:  (NO DEDUCTIBLE OR OUT-OF-POCKET WITH PLAN)  Additional Notes: ELIQUIS   5 MG BID  COVER-YES CO-PAY- $ 491.13 TIER- PREFERRED P/A-NO    ELIQUIS 10 MG -NON-FORMULARY    Memory Argue Phone Number: 08/30/2019, 5:16 PM

## 2019-08-30 NOTE — Progress Notes (Signed)
No major issues noted overnight.  Laboratory data reviewed and showed expected recovery of his white cell count although his hemoglobin has decreased currently at 6.4 with a platelet count of 48. Vascular ultrasound did not show right upper extremity deep vein thrombosis and was started on Lovenox.   Vitals reviewed and appears to be afebrile at this time with borderline blood pressure but otherwise hemodynamically stable.   Recommendations:  I agree with the current management and anticipate full recovery of his white cell count in the next 24 hours.  If his blood cultures remain negative antibiotics can be discontinued.    Full dose anticoagulation is needed related to his thrombosis.  He can be discharged back on Xarelto.  I would recommend transfusing 2 units of packed red cells given the drop in his hemoglobin.  We will continue to follow.

## 2019-08-30 NOTE — Plan of Care (Signed)

## 2019-08-30 NOTE — Progress Notes (Signed)
Progress Note    Tony Saunders  UXL:[id number] DOB: 10/06/1955  DOA: 08/28/2019 PCP: [name], [name], MD    Brief Narrative:     Medical records reviewed and are as summarized below:  Tony Saunders is an 64 y.o. male with history of severe mitral regurgitation, seizure, paroxysmal atrial fibrillation, myocardial infarction, diffuse large B-cell lymphoma, CHF, CAD presents to the ER with a chief complaint of right hand pain and swelling.   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 Weaned down to 2L nasal cannula 2. Chest x-ray shows decreased bilateral pleural effusions-improved from last film 3. Broad-spectrum antibiotics will cover any possible pneumonia 4. With current diagnosis of lymphoma, borderline hypotension blood pressure at 103/66, CT angio of chest: Stable large bilateral pleural effusions and diffuse ground-glass attenuation. Findings are consistent with congestive heart failure. Last echo 6/21: EF 35-40%.  During prior hospitalization required milrinone.   5. S/p PRBC so will give 1 unit PRBC-- IV lasix and then again if able.  May need cards consult  Neutropenic fever 1. White blood cell count 1.2 upon admission but trending up 2. T-max at home 101.7 3. Oncology consulted and recommended cefepime and vancomycin- if cultures negative in 24 hours, can stop abx 4. No source of infection identified at this time 5. Covid negative,blood cultures pending,urine culture pending,continue monitor  Right upper extremity DVT- port a cath placed 7/26 1. Duplex shows acute clot 2. lovenox for now-  was previously on xarelto but this was stopped in in June due to  anemia and epistaxis 3. Will ask TOC to check eliquis vs xarelto  Protein calorie malnutrition 1. Feeding supplement  Hypomagnesemia -replete  Anemia -will give 1 unit PRBC and IV lasix; will re-assess to see if another unit indicated    Family Communication/Anticipated D/C date and plan/Code Status   DVT prophylaxis: Lovenox full dose Code Status: Full Code.  Family Communication: wife at bedside Disposition Plan: Status is: Inpatient  Remains inpatient appropriate because:Inpatient level of care appropriate due to severity of illness   Dispo: The patient is from: Home              Anticipated d/c is to: Home              Anticipated d/c date is: 3 days              Patient currently is not medically stable to d/c.         Medical Consultants:    Oncology     Subjective:   Having some loose stools Swelling in right hand improved  Objective:    Vitals:   08/30/19 1130 08/30/19 1145 08/30/19 1200 08/30/19 1300  BP: 103/69  97/60 (!) 104/55  Pulse: 96 78 84 75  Resp: (!) 38 (!) 27 (!) 31 (!) 34  Temp: 97.7 F (36.5 C)  97.7 F (36.5 C)   TempSrc: Axillary  Axillary   SpO2: 97% 95% 90% 90%  Weight:        Intake/Output Summary (Last 24 hours) at 08/30/2019 1421 Last data filed at 08/30/2019 1033 Gross per 24 hour  Intake 951 ml  Output --  Net 951 ml  Filed Weights   08/30/19 0331  Weight: 93.6 kg    Exam:  General: Appearance:     Frail male chronically ill appearing     Lungs:     Diminished at bases, wheezing  Heart:   rrr  MS:   All extremities are intact. +LE edema b/l pitting, also with edema in the RUE  Neurologic:   Awake, alert, oriented x 3. No apparent focal neurological           defect.     Data Reviewed:   I have personally reviewed following labs and imaging studies:  Labs: Labs show the following:   Basic Metabolic Panel: Recent Labs  Lab 08/28/19 2312 08/30/19 0526  NA 137 134*  K 3.8 3.7  CL 96* 96*   CO2 33* 29  GLUCOSE 89 84  BUN 15 17  CREATININE 0.93 0.84  CALCIUM 8.4* 7.8*  MG  --  1.6*   GFR Estimated Creatinine Clearance: 108.3 mL/min (by C-G formula based on SCr of 0.84 mg/dL). Liver Function Tests: Recent Labs  Lab 08/28/19 2312 08/30/19 0526  AST 14* 12*  ALT 13 13  ALKPHOS 113 77  BILITOT 0.8 0.8  PROT 5.1* 4.2*  ALBUMIN 2.6* 2.0*   No results for input(s): LIPASE, AMYLASE in the last 168 hours. No results for input(s): AMMONIA in the last 168 hours. Coagulation profile No results for input(s): INR, PROTIME in the last 168 hours.  CBC: Recent Labs  Lab 08/28/19 2312 08/30/19 0526  WBC 1.2* 2.0*  NEUTROABS 0.9* 1.6*  HGB 7.5* 6.4*  HCT 25.6* 21.2*  MCV 97.0 95.5  PLT 62* 48*   Cardiac Enzymes: No results for input(s): CKTOTAL, CKMB, CKMBINDEX, TROPONINI in the last 168 hours. BNP (last 3 results) No results for input(s): PROBNP in the last 8760 hours. CBG: No results for input(s): GLUCAP in the last 168 hours. D-Dimer: No results for input(s): DDIMER in the last 72 hours. Hgb A1c: No results for input(s): HGBA1C in the last 72 hours. Lipid Profile: No results for input(s): CHOL, HDL, LDLCALC, TRIG, CHOLHDL, LDLDIRECT in the last 72 hours. Thyroid function studies: No results for input(s): TSH, T4TOTAL, T3FREE, THYROIDAB in the last 72 hours.  Invalid input(s): FREET3 Anemia work up: No results for input(s): VITAMINB12, FOLATE, FERRITIN, TIBC, IRON, RETICCTPCT in the last 72 hours. Sepsis Labs: Recent Labs  Lab 08/28/19 2312 08/29/19 0219 08/30/19 0526  WBC 1.2*  --  2.0*  LATICACIDVEN 2.1* 1.1  --     Microbiology Recent Results (from the past 240 hour(s))  Blood culture (routine x 2)     Status: None (Preliminary result)   Collection Time: 08/28/19 11:00 PM   Specimen: BLOOD LEFT ARM  Result Value Ref Range Status   Specimen Description BLOOD LEFT ARM  Final   Special Requests   Final    BOTTLES DRAWN AEROBIC AND ANAEROBIC  Blood Culture adequate volume   Culture   Final    NO GROWTH 1 DAY Performed at [facility], [address], [place], [place] [address]    Report Status PENDING  Incomplete  Blood culture (routine x 2)     Status: None (Preliminary result)   Collection Time: 08/28/19 11:13 PM   Specimen: BLOOD LEFT HAND  Result Value Ref Range Status   Specimen Description BLOOD LEFT HAND  Final   Special Requests   Final    BOTTLES DRAWN AEROBIC ONLY Blood Culture adequate volume   Culture  Final    NO GROWTH 1 DAY Performed at [facility], [place] [address], [place], [place] [address]    Report Status PENDING  Incomplete  SARS Coronavirus 2 by RT PCR (hospital order, performed in [facility] hospital lab) Nasopharyngeal Nasopharyngeal Swab     Status: None   Collection Time: 08/29/19  1:33 AM   Specimen: Nasopharyngeal Swab  Result Value Ref Range Status   SARS Coronavirus 2 NEGATIVE NEGATIVE Final    Comment: (NOTE) SARS-CoV-2 target nucleic acids are NOT DETECTED.  The SARS-CoV-2 RNA is generally detectable in upper and lower respiratory specimens during the acute phase of infection. The lowest concentration of SARS-CoV-2 viral copies this assay can detect is 250 copies / mL. A negative result does not preclude SARS-CoV-2 infection and should not be used as the sole basis for treatment or other patient management decisions.  A negative result may occur with improper specimen collection / handling, submission of specimen other than nasopharyngeal swab, presence of viral mutation(s) within the areas targeted by this assay, and inadequate number of viral copies (<250 copies / mL). A negative result must be combined with clinical observations, patient history, and epidemiological information.  Fact Sheet for Patients:   StrictlyIdeas.no  Fact Sheet for Healthcare Providers: BankingDealers.co.za  This test is not yet approved or   cleared by the [place] FDA and has been authorized for detection and/or diagnosis of SARS-CoV-2 by FDA under an Emergency Use Authorization (EUA).  This EUA will remain in effect (meaning this test can be used) for the duration of the COVID-19 declaration under Section 564(b)(1) of the Act, 21 U.S.C. section 360bbb-3(b)(1), unless the authorization is terminated or revoked sooner.  Performed at [facility], [place] [address] [place], [place] [address]   Urine culture     Status: None   Collection Time: 08/29/19  1:36 AM   Specimen: Urine, Random  Result Value Ref Range Status   Specimen Description URINE, RANDOM  Final   Special Requests NONE  Final   Culture   Final    NO GROWTH Performed at [facility], [place] [address] [place], [place] [address]    Report Status 08/29/2019 FINAL  Final    Procedures and diagnostic studies:  DG Chest 2 View  Result Date: 08/28/2019 CLINICAL DATA:  64 year old male with fever.  Lymphoma EXAM: CHEST - 2 VIEW COMPARISON:  CT Chest, Abdomen, and Pelvis 08/17/2019, and earlier. FINDINGS: Seated AP and lateral views of the chest. Moderate bilateral pleural effusions appear mildly regressed from 08/17/2019. No superimposed pneumothorax. Coarse bilateral perihilar opacity has regressed since June but not resolved. Grossly stable mediastinal contours. Right chest porta cath is stable from last month. No acute osseous abnormality identified. Paucity of bowel gas in the upper abdomen. IMPRESSION: 1. Moderate bilateral pleural effusions, decreased from large bilateral effusions seen last month. 2. Regressed but not fully resolved coarse bilateral perihilar opacity since June, nonspecific but might be lymphoma treatment related. Electronically Signed   By: Genevie Ann M.D.   On: 08/28/2019 23:25   CT ANGIO CHEST PE W OR WO CONTRAST  Result Date: 08/29/2019 CLINICAL DATA:  PE suspected, high probability. Dyspnea, fever. Bilateral lower extremity  edema. Lymphoma. EXAM: CT ANGIOGRAPHY CHEST WITH CONTRAST TECHNIQUE: Multidetector CT imaging of the chest was performed using the standard protocol during bolus administration of intravenous contrast. Multiplanar CT image reconstructions and MIPs were obtained to evaluate the vascular anatomy. CONTRAST:  59mL OMNIPAQUE IOHEXOL  350 MG/ML SOLN COMPARISON:  Two-view chest x-ray 08/28/2019. CT of the chest, abdomen, and pelvis 08/17/2019 FINDINGS: Cardiovascular: The heart size is normal. Dense coronary artery calcifications are present. Aorta and great vessel origins are within normal limits. Pulmonary artery opacification is excellent. No focal filling defects are present to suggest pulmonary embolus. Mediastinum/Nodes: Right paratracheal adenopathy is similar the prior study. Right hilar nodal tissue is again noted. Subcentimeter axillary nodes are present. Lungs/Pleura: Large bilateral pleural effusions are similar the prior exam centrilobular emphysema is again noted. Chronic airspace opacities are stable. Bronchiectasis is again noted. Increased ground-glass attenuation is evident. Upper Abdomen: Visualized upper abdomen is unremarkable. Musculoskeletal: Vertebral body heights and alignment are normal. Sternum and ribs are unremarkable. Review of the MIP images confirms the above findings. IMPRESSION: 1. No evidence for pulmonary embolus. 2. Stable large bilateral pleural effusions and diffuse ground-glass attenuation. Findings are consistent with congestive heart failure. 3. Stable chronic airspace opacities and bronchiectasis. 4. Stable mediastinal and right hilar adenopathy. 5. Dense coronary artery calcifications. 6. Emphysema (ICD10-J43.9). Electronically Signed   By: San Morelle M.D.   On: 08/29/2019 05:37   VAS [place] UPPER EXTREMITY VENOUS DUPLEX  Result Date: 08/29/2019 UPPER VENOUS STUDY  Indications: Edema Comparison Study: no prior Performing Technologist: Abram Sander RVS  Examination  Guidelines: A complete evaluation includes B-mode imaging, spectral Doppler, color Doppler, and power Doppler as needed of all accessible portions of each vessel. Bilateral testing is considered an integral part of a complete examination. Limited examinations for reoccurring indications may be performed as noted.  Right Findings: +----------+------------+---------+-----------+----------+-------+ RIGHT     CompressiblePhasicitySpontaneousPropertiesSummary +----------+------------+---------+-----------+----------+-------+ IJV           Full       Yes       Yes                      +----------+------------+---------+-----------+----------+-------+ Subclavian    None       No        No                Acute  +----------+------------+---------+-----------+----------+-------+ Axillary      None       No        No                Acute  +----------+------------+---------+-----------+----------+-------+ Brachial      None       No        No                Acute  +----------+------------+---------+-----------+----------+-------+ Radial        Full                                          +----------+------------+---------+-----------+----------+-------+ Ulnar         Full                                          +----------+------------+---------+-----------+----------+-------+ Cephalic      Full                                          +----------+------------+---------+-----------+----------+-------+ Basilic  Full                                          +----------+------------+---------+-----------+----------+-------+  Summary:  Right: 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  *See table(s) above for measurements and observations.  Diagnosing physician: Curt Jews MD Electronically signed by Curt Jews MD on 08/29/2019 at 1:43:58 PM.    Final     Medications:    . (feeding supplement) PROSource Plus  30 mL Oral BID BM  . sodium chloride   Intravenous Once  . amiodarone  200 mg Oral Daily  . budesonide  0.5 mg Nebulization BID  . Chlorhexidine Gluconate Cloth  6 each Topical Daily  . clopidogrel  75 mg Oral Daily  . digoxin  125 mcg Oral Daily  . enoxaparin (LOVENOX) injection  90 mg Subcutaneous Q12H  . feeding supplement (ENSURE ENLIVE)  237 mL Oral BID BM  . guaiFENesin  600 mg Oral BID  . ipratropium-albuterol  3 mL Nebulization Q6H  . lamoTRIgine  100 mg Oral BID  . levETIRAcetam  750 mg Oral BID  . midodrine  5 mg Oral TID WC  . pantoprazole  40 mg Oral Daily  . sertraline  25 mg Oral Daily   Continuous Infusions: . ceFEPime (MAXIPIME) IV 2 g (08/30/19 0948)  . magnesium sulfate bolus IVPB    . vancomycin 1,000 mg (08/30/19 0600)     LOS: 1 day   Geradine Girt  [facility]   How to contact the [facility] Attending or Consulting provider [place] or covering provider during after hours [place], for this patient?  1. Check the care team in [facility] and look for a) attending/consulting TR[facility] provider listed and b) the [facility] team listed 2. Log into [email or web address] and use [place]'s universal password to access. If you do not have the password, please contact the hospital operator. 3. Locate the [facility] provider you are looking for under [facility] and page to a number that you can be directly reached. 4. If you still have difficulty reaching the provider, please page the [facility] (Director on Call) for the Hospitalists listed on amion for assistance.  08/30/2019, 2:21 PM

## 2019-08-31 ENCOUNTER — Inpatient Hospital Stay (HOSPITAL_COMMUNITY): Payer: No Typology Code available for payment source

## 2019-08-31 DIAGNOSIS — I5023 Acute on chronic systolic (congestive) heart failure: Secondary | ICD-10-CM

## 2019-08-31 LAB — CBC WITH DIFFERENTIAL/PLATELET
Abs Immature Granulocytes: 0 10*3/uL (ref 0.00–0.07)
Basophils Absolute: 0 10*3/uL (ref 0.0–0.1)
Basophils Relative: 0 %
Eosinophils Absolute: 0 10*3/uL (ref 0.0–0.5)
Eosinophils Relative: 0 %
HCT: 26.6 % — ABNORMAL LOW (ref 39.0–52.0)
Hemoglobin: 7.8 g/dL — ABNORMAL LOW (ref 13.0–17.0)
Lymphocytes Relative: 1 %
Lymphs Abs: 0.1 10*3/uL — ABNORMAL LOW (ref 0.7–4.0)
MCH: 27.9 pg (ref 26.0–34.0)
MCHC: 29.3 g/dL — ABNORMAL LOW (ref 30.0–36.0)
MCV: 95 fL (ref 80.0–100.0)
Monocytes Absolute: 0.1 10*3/uL (ref 0.1–1.0)
Monocytes Relative: 1 %
Neutro Abs: 5.8 10*3/uL (ref 1.7–7.7)
Neutrophils Relative %: 98 %
Platelets: 83 10*3/uL — ABNORMAL LOW (ref 150–400)
RBC: 2.8 MIL/uL — ABNORMAL LOW (ref 4.22–5.81)
RDW: 18.7 % — ABNORMAL HIGH (ref 11.5–15.5)
WBC: 5.9 10*3/uL (ref 4.0–10.5)
nRBC: 0 % (ref 0.0–0.2)
nRBC: 0 /100 WBC

## 2019-08-31 LAB — BASIC METABOLIC PANEL
Anion gap: 11 (ref 5–15)
BUN: 22 mg/dL (ref 8–23)
CO2: 26 mmol/L (ref 22–32)
Calcium: 7.8 mg/dL — ABNORMAL LOW (ref 8.9–10.3)
Chloride: 97 mmol/L — ABNORMAL LOW (ref 98–111)
Creatinine, Ser: 0.98 mg/dL (ref 0.61–1.24)
GFR calc Af Amer: >60 mL/min (ref >60)
GFR calc non Af Amer: >60 mL/min (ref >60)
Glucose, Bld: 128 mg/dL — ABNORMAL HIGH (ref 70–99)
Potassium: 3.5 mmol/L (ref 3.5–5.1)
Sodium: 134 mmol/L — ABNORMAL LOW (ref 135–145)

## 2019-08-31 MED ORDER — FUROSEMIDE 10 MG/ML IJ SOLN
40.0000 mg | Freq: Once | INTRAMUSCULAR | Status: AC
  Administered 2019-08-31: 40 mg via INTRAVENOUS
  Filled 2019-08-31: qty 4

## 2019-08-31 MED ORDER — FUROSEMIDE 10 MG/ML IJ SOLN
40.0000 mg | Freq: Two times a day (BID) | INTRAMUSCULAR | Status: DC
  Administered 2019-08-31 – 2019-09-01 (×2): 40 mg via INTRAVENOUS
  Filled 2019-08-31 (×2): qty 4

## 2019-08-31 MED ORDER — FUROSEMIDE 10 MG/ML IJ SOLN
60.0000 mg | Freq: Once | INTRAMUSCULAR | Status: AC
  Administered 2019-08-31: 60 mg via INTRAVENOUS
  Filled 2019-08-31: qty 6

## 2019-08-31 MED ORDER — MIDODRINE HCL 5 MG PO TABS
10.0000 mg | ORAL_TABLET | Freq: Three times a day (TID) | ORAL | Status: DC
  Administered 2019-08-31 – 2019-09-09 (×26): 10 mg via ORAL
  Filled 2019-08-31 (×25): qty 2

## 2019-08-31 MED ORDER — IPRATROPIUM-ALBUTEROL 0.5-2.5 (3) MG/3ML IN SOLN
3.0000 mL | Freq: Two times a day (BID) | RESPIRATORY_TRACT | Status: DC
  Administered 2019-08-31 – 2019-09-09 (×19): 3 mL via RESPIRATORY_TRACT
  Filled 2019-08-31 (×20): qty 3

## 2019-08-31 MED ORDER — MIDODRINE HCL 5 MG PO TABS
5.0000 mg | ORAL_TABLET | Freq: Three times a day (TID) | ORAL | Status: DC
  Administered 2019-08-31 (×2): 5 mg via ORAL
  Filled 2019-08-31 (×2): qty 1

## 2019-08-31 MED ORDER — LEVETIRACETAM 750 MG PO TABS
750.0000 mg | ORAL_TABLET | Freq: Two times a day (BID) | ORAL | Status: DC
  Administered 2019-09-01 – 2019-09-09 (×18): 750 mg via ORAL
  Filled 2019-08-31 (×18): qty 1

## 2019-08-31 MED ORDER — SODIUM CHLORIDE 0.9 % IV SOLN
750.0000 mg | Freq: Once | INTRAVENOUS | Status: AC
  Administered 2019-08-31: 750 mg via INTRAVENOUS
  Filled 2019-08-31: qty 7.5

## 2019-08-31 NOTE — Progress Notes (Signed)
Pt displaying increased effort to breathe. On 6L humidified , O2 sat @ 85%. RT in to assess, switches patient to 15L hi-flow salter. Patient O2 sat @ 88%. Patient suctioned at bedside and then on NRB, O2 sat at 91%.  Bi-Pap recommended by RT, patient placed on bi-pap. Will continue to monitor. Patient is stable.

## 2019-08-31 NOTE — Plan of Care (Signed)
  Problem: Education: Goal: Knowledge of General Education information will improve Description: Including pain rating scale, medication(s)/side effects and non-pharmacologic comfort measures Outcome: Progressing   Problem: Clinical Measurements: Goal: Ability to maintain clinical measurements within normal limits will improve Outcome: Progressing   Problem: Clinical Measurements: Goal: Diagnostic test results will improve Outcome: Progressing   Problem: Clinical Measurements: Goal: Respiratory complications will improve Outcome: Progressing   Problem: Activity: Goal: Risk for activity intolerance will decrease Outcome: Progressing   Problem: Elimination: Goal: Will not experience complications related to bowel motility Outcome: Progressing   Problem: Safety: Goal: Ability to remain free from injury will improve Outcome: Progressing

## 2019-08-31 NOTE — Progress Notes (Signed)
Patient's clinical status noted including oxygen requirement and respiratory complaints.  Laboratory data reviewed which showed improvement in his hemoglobin and platelet count.  His white cell count has normalized including normalization of his neutrophil count.  There is no additional need for transfusion or growth factor support at this time.  He remains afebrile without any clear-cut source of infection.  I have no objection to stopping antibiotics.  I agree with continuing anticoagulation at this time.

## 2019-08-31 NOTE — Progress Notes (Signed)
Progress Note    Tony Saunders  TML:[id number] DOB: 08-12-1955  DOA: 08/28/2019 PCP: [name], [name], MD    Brief Narrative:     Medical records reviewed and are as summarized below:  Tony Saunders is an 64 y.o. male with history of severe mitral regurgitation, seizure, paroxysmal atrial fibrillation, myocardial infarction, diffuse large B-cell lymphoma, CHF, CAD presents to the ER with a chief complaint of right hand pain and swelling.   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 Weaned down to 2L nasal cannula 2. Chest x-ray shows decreased bilateral pleural effusions-improved from last film per reading 3. Broad-spectrum antibiotics will cover any possible pneumonia 4. With current diagnosis of lymphoma, borderline hypotension blood pressure at 103/66, CT angio of chest: Stable large bilateral pleural effusions and diffuse ground-glass attenuation. Findings are consistent with congestive heart failure. Last echo 6/21: EF 35-40%.  During prior hospitalization required milrinone-- will get cardiology support as unable to diurese due to low BP 5. gave 1 unit PRBC-- IV lasix x 2 as well 6. Pulmonary toilet  Neutropenic fever 1. White blood cell count 1.2 upon admission but trending up 2. T-max at home 101.7- no further temperature 3. Oncology consulted and recommended cefepime and vancomycin- if cultures negative in 24 hours, can stop abx-- will place stop date of 8/13 4. No source of infection identified at this time 5. Covid negative,blood cultures neg,urine culture neg,continue monitor  Right upper extremity DVT- port a cath placed 7/26 1. Duplex shows acute  clot 2. lovenox for now-  was previously on xarelto but this was stopped in in June due to anemia and epistaxis 3. Will ask TOC to check eliquis vs xarelto  Protein calorie malnutrition 1. Feeding supplement  Hypomagnesemia -repleted  Anemia -s/p 1 unit prbc    Family Communication/Anticipated D/C date and plan/Code Status   DVT prophylaxis: Lovenox full dose Code Status: Full Code.  Family Communication: wife at bedside 8/12 Disposition Plan: Status is: Inpatient  Remains inpatient appropriate because:Inpatient level of care appropriate due to severity of illness   Dispo: The patient is from: Home              Anticipated d/c is to: Home              Anticipated d/c date is: 3 days              Patient currently is not medically stable to d/c.         Medical Consultants:    Oncology  cardiology     Subjective:   Had episode of hypotension last PM as well as SOB Arm pain improved  Objective:    Vitals:   08/31/19 0700 08/31/19 0712 08/31/19 0730 08/31/19 1019  BP: (!) 79/63 100/64 100/68   Pulse: 71 76 61 66  Resp: (!) 30 (!) 29 (!) 23   Temp:   (!) 97.5 F (36.4 C)   TempSrc:   Oral   SpO2: 95% 95% 97%   Weight:        Intake/Output Summary (Last 24 hours) at 08/31/2019 1056 Last data filed at 08/31/2019 0400 Gross per 24 hour  Intake 1761.5 ml  Output 1000 ml  Net 761.5 ml   Filed Weights   08/30/19 0331 08/31/19 0500  Weight: 93.6 kg 91 kg    Exam:  General: Appearance:     Overweight male who appears ill     Lungs:     Diminished breath sounds at base, +cough, on O2, mild increase work of breahting  Heart:    Normal heart rate.  MS:   All extremities are intact.  +LE edema as well as right arm edema  Neurologic:   Awake, alert, oriented x 3. No apparent focal neurological           defect.     Data Reviewed:   I have personally reviewed following labs and imaging studies:  Labs: Labs show the following:   Basic  Metabolic Panel: Recent Labs  Lab 08/28/19 2312 08/28/19 2312 08/30/19 0526 08/31/19 0550  NA 137  --  134* 134*  K 3.8   < > 3.7 3.5  CL 96*  --  96* 97*  CO2 33*  --  29 26  GLUCOSE 89  --  84 128*  BUN 15  --  17 22  CREATININE 0.93  --  0.84 0.98  CALCIUM 8.4*  --  7.8* 7.8*  MG  --   --  1.6*  --    < > = values in this interval not displayed.   GFR Estimated Creatinine Clearance: 85.8 mL/min (by C-G formula based on SCr of 0.98 mg/dL). Liver Function Tests: Recent Labs  Lab 08/28/19 2312 08/30/19 0526  AST 14* 12*  ALT 13 13  ALKPHOS 113 77  BILITOT 0.8 0.8  PROT 5.1* 4.2*  ALBUMIN 2.6* 2.0*   No results for input(s): LIPASE, AMYLASE in the last 168 hours. No results for input(s): AMMONIA in the last 168 hours. Coagulation profile No results for input(s): INR, PROTIME in the last 168 hours.  CBC: Recent Labs  Lab 08/28/19 2312 08/30/19 0526 08/31/19 0550  WBC 1.2* 2.0* 5.9  NEUTROABS 0.9* 1.6* 5.8  HGB 7.5* 6.4* 7.8*  HCT 25.6* 21.2* 26.6*  MCV 97.0 95.5 95.0  PLT 62* 48* 83*   Cardiac Enzymes: No results for input(s): CKTOTAL, CKMB, CKMBINDEX, TROPONINI in the last 168 hours. BNP (last 3 results) No results for input(s): PROBNP in the last 8760 hours. CBG: No results for input(s): GLUCAP in the last 168 hours. D-Dimer: No results for input(s): DDIMER in the last 72 hours. Hgb A1c: No results for input(s): HGBA1C in the last 72 hours. Lipid Profile: No results for input(s): CHOL, HDL, LDLCALC, TRIG, CHOLHDL, LDLDIRECT in the last 72 hours. Thyroid function studies: No results for input(s): TSH, T4TOTAL, T3FREE, THYROIDAB in the last 72 hours.  Invalid input(s): FREET3 Anemia work up: No results for input(s): VITAMINB12, FOLATE, FERRITIN, TIBC, IRON, RETICCTPCT in the last 72 hours. Sepsis Labs: Recent Labs  Lab 08/28/19 2312 08/29/19 0219 08/30/19 0526 08/31/19 0550  WBC 1.2*  --  2.0* 5.9  LATICACIDVEN 2.1* 1.1  --   --      Microbiology Recent Results (from the past 240 hour(s))  Blood culture (routine x 2)     Status: None (Preliminary result)   Collection Time: 08/28/19 11:00 PM   Specimen: BLOOD LEFT ARM  Result Value Ref Range Status   Specimen Description BLOOD LEFT ARM  Final   Special Requests   Final    BOTTLES DRAWN AEROBIC AND ANAEROBIC Blood Culture adequate volume   Culture   Final  NO GROWTH 2 DAYS Performed at [facility], [place] [address], [place], [place] [address]    Report Status PENDING  Incomplete  Blood culture (routine x 2)     Status: None (Preliminary result)   Collection Time: 08/28/19 11:13 PM   Specimen: BLOOD LEFT HAND  Result Value Ref Range Status   Specimen Description BLOOD LEFT HAND  Final   Special Requests   Final    BOTTLES DRAWN AEROBIC ONLY Blood Culture adequate volume   Culture   Final    NO GROWTH 2 DAYS Performed at [facility], [place] [address], [place], [place] [address]    Report Status PENDING  Incomplete  SARS Coronavirus 2 by RT PCR (hospital order, performed in [facility] hospital lab) Nasopharyngeal Nasopharyngeal Swab     Status: None   Collection Time: 08/29/19  1:33 AM   Specimen: Nasopharyngeal Swab  Result Value Ref Range Status   SARS Coronavirus 2 NEGATIVE NEGATIVE Final    Comment: (NOTE) SARS-CoV-2 target nucleic acids are NOT DETECTED.  The SARS-CoV-2 RNA is generally detectable in upper and lower respiratory specimens during the acute phase of infection. The lowest concentration of SARS-CoV-2 viral copies this assay can detect is 250 copies / mL. A negative result does not preclude SARS-CoV-2 infection and should not be used as the sole basis for treatment or other patient management decisions.  A negative result may occur with improper specimen collection / handling, submission of specimen other than nasopharyngeal swab, presence of viral mutation(s) within the areas targeted by this assay, and inadequate number of  viral copies (<250 copies / mL). A negative result must be combined with clinical observations, patient history, and epidemiological information.  Fact Sheet for Patients:   StrictlyIdeas.no  Fact Sheet for Healthcare Providers: BankingDealers.co.za  This test is not yet approved or  cleared by the [place] FDA and has been authorized for detection and/or diagnosis of SARS-CoV-2 by FDA under an Emergency Use Authorization (EUA).  This EUA will remain in effect (meaning this test can be used) for the duration of the COVID-19 declaration under Section 564(b)(1) of the Act, 21 U.S.C. section 360bbb-3(b)(1), unless the authorization is terminated or revoked sooner.  Performed at [facility], [place] [address], [place], [place] [address]   Urine culture     Status: None   Collection Time: 08/29/19  1:36 AM   Specimen: Urine, Random  Result Value Ref Range Status   Specimen Description URINE, RANDOM  Final   Special Requests NONE  Final   Culture   Final    NO GROWTH Performed at [facility], [place] [address] [place], [place] [address]    Report Status 08/29/2019 FINAL  Final    Procedures and diagnostic studies:  No results found.  Medications:   . (feeding supplement) PROSource Plus  30 mL Oral BID BM  . sodium chloride   Intravenous Once  . amiodarone  200 mg Oral Daily  . budesonide  0.5 mg Nebulization BID  . Chlorhexidine Gluconate Cloth  6 each Topical Daily  . clopidogrel  75 mg Oral Daily  . digoxin  125 mcg Oral Daily  . enoxaparin (LOVENOX) injection  90 mg Subcutaneous Q12H  . feeding supplement (ENSURE ENLIVE)  237 mL Oral BID BM  . guaiFENesin  600 mg Oral BID  . ipratropium-albuterol  3 mL Nebulization BID  . lamoTRIgine  100 mg Oral BID  . levETIRAcetam  750  mg Oral BID  . midodrine  5 mg Oral TID WC  . pantoprazole  40 mg Oral Daily  . sertraline  25 mg Oral Daily   Continuous  Infusions: . ceFEPime (MAXIPIME) IV 2 g (08/31/19 1025)  . vancomycin 1,000 mg (08/31/19 0612)     LOS: 2 days   Geradine Girt  [facility]   How to contact the [facility] Attending or Consulting provider [place] or covering provider during after hours [place], for this patient?  1. Check the care team in [facility] and look for a) attending/consulting [facility] provider listed and b) the [facility] team listed 2. Log into [email or web address] and use [place]'s universal password to access. If you do not have the password, please contact the hospital operator. 3. Locate the [facility] provider you are looking for under [facility] and page to a number that you can be directly reached. 4. If you still have difficulty reaching the provider, please page the [facility] (Director on Call) for the Hospitalists listed on amion for assistance.  08/31/2019, 10:56 AM

## 2019-08-31 NOTE — Progress Notes (Addendum)
[facility] fired red due to patient's b/p 79/63, MD ordered his midodrine to be given earlier, Rechecked b/p at 0712- 100/64, HR 72, 95% on 3.5 L nasal cannula. Resp 28-30. No acute changes. Will continue to observe b/p. Due to giving lasix, patient placed on q 30 min monitoring. Will continue strict I's and o's for this patient.
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
## 2019-08-31 NOTE — Progress Notes (Signed)
Patient seen for respiratory distress with increased supplemental O2 requirement.  He has acute CHF and is being treated with Lasix 40 mg twice daily, but has not urinated much per RN and has had increasing respiratory rate and oxygen requirements.  At time of exam, the patient is on BiPAP, reports feeling much better and notes that the BiPAP "is a God send."  Respiratory rate is now in the mid 20s and he is oxygenating well.  Portable chest x-ray was repeated, not yet read, appears to demonstrate worsening CHF findings and pleural effusion.  Plan to continue BiPAP tonight, will change his Keppra to IV for tonight, and give another dose of IV Lasix now as he has not been diuresing much.

## 2019-08-31 NOTE — Progress Notes (Signed)
PT Cancellation Note  Patient Details Name: Tony Saunders MRN: [id number] DOB: [birth year]   Cancelled Treatment:    Reason Eval/Treat Not Completed: Fatigue/lethargy limiting ability to participate Pt reporting he is very tired and wanting to hold on PT. Will follow up as schedule allows.   Lou Miner, DPT  Acute Rehabilitation Services  Pager: [phone] Office: [phone]    Rudean Hitt 08/31/2019, 3:56 PM

## 2019-08-31 NOTE — Progress Notes (Signed)
Dressing to right arm changed wrapped with Kerlix and elevated with pillow. Unna boots applied to both lower ext. by ortho tech.

## 2019-08-31 NOTE — Progress Notes (Signed)
Orthopedic Tech Progress Note Patient Details:  Charbel Los 1955-04-24 [id number]  Ortho Devices Type of Ortho Device: Louretta Parma boot Ortho Device/Splint Location: Bilateral Lower Extremity Ortho Device/Splint Interventions: Ordered, Application   Post Interventions Patient Tolerated: Well Instructions Provided: Care of device   [name] 08/31/2019, 3:42 PM

## 2019-08-31 NOTE — Progress Notes (Signed)
RT placed pt on BIPAP for respiratory distress and increased WOB. RT arrived to pt room to find pt in distress on 6 Lpm w/humidity with sats in 70's. Pt place on HFNC [place] @ 15 Lpm, w/not much improvement in sats, so at this time pt was placed on NRB 100% while RT prepared BIPAP for placement. Pt placed on BIPAP with improvement in sats and WOB. MD Opyd notified and ordered CXR and more lasix. Pt respiratory status stable at this time. RT will continue to monitor.

## 2019-08-31 NOTE — Progress Notes (Addendum)
Patient in bed, increased work of breathing. Patient was just bathe and bed linen changed. Was laid flat upon this activity. Audible wheezing noted during the time. Frequent breaks given. MD notified.  sats 86% on 3L. Increased to 4L now at 90-91%. Patient still short of breath. Resp 32, HR 100. HOB elevated as tolerated.   Labs drawn and sent from central line. MD visits with patient.  New order noted.   Vitals cycle at 61min after lasix 40 given now. B/p (see vs charting)

## 2019-08-31 NOTE — Progress Notes (Signed)
Patient seen for acute SOB. He was admitted early am of 8/11 with acute hypoxic resp failure and neutropenic fever, had CTA chest notable for large pleural effusions and CHF. Hgb dropped to 6.5 yesterday and he was transfused one unit, requiring Lasix with the transfusion.   He was supine for bathing when he developed acute increase RR and oxygen requirement, reports feeling better now with [facility] raised but continues to be tachypneic in low-mid 30s and continues to have increased O2 requirement. Neck veins are bulging. No pallor, cyanosis, or diaphoresis.   Plan to give Lasix 40 mg IV now, continue to keep HOB raised, continue strict I/Os.

## 2019-09-01 LAB — CBC
HCT: 25.1 % — ABNORMAL LOW (ref 39.0–52.0)
Hemoglobin: 7.7 g/dL — ABNORMAL LOW (ref 13.0–17.0)
MCH: 28.5 pg (ref 26.0–34.0)
MCHC: 30.7 g/dL (ref 30.0–36.0)
MCV: 93 fL (ref 80.0–100.0)
Platelets: 71 10*3/uL — ABNORMAL LOW (ref 150–400)
RBC: 2.7 MIL/uL — ABNORMAL LOW (ref 4.22–5.81)
RDW: 18.3 % — ABNORMAL HIGH (ref 11.5–15.5)
WBC: 5.1 10*3/uL (ref 4.0–10.5)
nRBC: 0 % (ref 0.0–0.2)

## 2019-09-01 LAB — BASIC METABOLIC PANEL
Anion gap: 11 (ref 5–15)
BUN: 22 mg/dL (ref 8–23)
CO2: 29 mmol/L (ref 22–32)
Calcium: 7.5 mg/dL — ABNORMAL LOW (ref 8.9–10.3)
Chloride: 96 mmol/L — ABNORMAL LOW (ref 98–111)
Creatinine, Ser: 1.15 mg/dL (ref 0.61–1.24)
GFR calc Af Amer: >60 mL/min (ref >60)
GFR calc non Af Amer: >60 mL/min (ref >60)
Glucose, Bld: 82 mg/dL (ref 70–99)
Potassium: 3 mmol/L — ABNORMAL LOW (ref 3.5–5.1)
Sodium: 136 mmol/L (ref 135–145)

## 2019-09-01 MED ORDER — POTASSIUM CHLORIDE CRYS ER 20 MEQ PO TBCR
40.0000 meq | EXTENDED_RELEASE_TABLET | Freq: Once | ORAL | Status: DC
  Filled 2019-09-01: qty 2

## 2019-09-01 MED ORDER — FUROSEMIDE 10 MG/ML IJ SOLN
80.0000 mg | Freq: Two times a day (BID) | INTRAMUSCULAR | Status: DC
  Administered 2019-09-01 – 2019-09-05 (×7): 80 mg via INTRAVENOUS
  Filled 2019-09-01 (×7): qty 8

## 2019-09-01 MED ORDER — SPIRONOLACTONE 12.5 MG HALF TABLET
12.5000 mg | ORAL_TABLET | Freq: Every day | ORAL | Status: DC
  Administered 2019-09-01 – 2019-09-02 (×2): 12.5 mg via ORAL
  Filled 2019-09-01 (×2): qty 1

## 2019-09-01 MED ORDER — POTASSIUM CHLORIDE CRYS ER 10 MEQ PO TBCR
40.0000 meq | EXTENDED_RELEASE_TABLET | Freq: Once | ORAL | Status: AC
  Administered 2019-09-01: 40 meq via ORAL
  Filled 2019-09-01: qty 4

## 2019-09-01 NOTE — Progress Notes (Addendum)
Progress Note    Tony Saunders  ENI:[id number] DOB: [birth year]  DOA: 08/28/2019 PCP: [name], [name], MD    Brief Narrative:     Medical records reviewed and are as summarized below:  Tony Saunders is an 64 y.o. male with history of severe mitral regurgitation, seizure, paroxysmal atrial fibrillation, myocardial infarction, diffuse large B-cell lymphoma, CHF, CAD presents to the ER with a chief complaint of right hand pain and swelling.   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 Weaned down to 2L nasal cannula; BIPAP PRN 2. Chest x-ray shows decreased bilateral pleural effusions-improved from last film per reading 3. Broad-spectrum antibiotics will cover any possible pneumonia 4. With current diagnosis of lymphoma, borderline hypotension blood pressure at 103/66, CT angio of chest: Stable large bilateral pleural effusions and diffuse ground-glass attenuation. Findings are consistent with congestive heart failure. Last echo 6/21: EF 35-40%.  During prior hospitalization required milrinone-- cardiology consult appreciated: I Vlasix and midodrine 5. gave 1 unit PRBC-- IV lasix as tolerated 6. Pulmonary toilet 7. Weight down but I/Os still +  Neutropenic fever with high grade lymphoma 1. White blood cell count 1.2 upon admission but trending up 2. T-max at home 101.7- no further temperature 3. Oncology consulted and recommended cefepime and vancomycin- if cultures negative in 24 hours, can stop abx-- will place stop date of 8/13 4. No source of infection identified at this time 5. Covid negative,blood cultures neg,urine  culture neg,continue monitor  Right upper extremity DVT- port a cath placed 7/26 1. Duplex shows acute clot 2. lovenox for now-  was previously on xarelto but this was stopped in in June due to anemia and epistaxis 3. Will ask TOC to check eliquis vs xarelto  Protein calorie malnutrition 1. Feeding supplement  Hypomagnesemia -repleted  Anemia -s/p 1 unit prbc  Persistent atrial flutter -rate controlled -per cards, has failed cardioversion in past  Severe MR -per cards  Family Communication/Anticipated D/C date and plan/Code Status   DVT prophylaxis: Lovenox full dose Code Status: Full Code.  Family Communication: wife at bedside 8/12; hallway 8/14 Disposition Plan: Status is: Inpatient  Remains inpatient appropriate because:Inpatient level of care appropriate due to severity of illness   Dispo: The patient is from: Home              Anticipated d/c is to: Home              Anticipated d/c date is: 3 days              Patient currently is not medically stable to d/c.         Medical Consultants:    Oncology  cardiology     Subjective:   bipap helped breathing last night  Objective:    Vitals:   09/01/19 0700 09/01/19 0749 09/01/19 1050 09/01/19 1200  BP: 103/63  100/82 (!) 91/56  Pulse: 66 62 63 63  Resp: (!) 33 (!) 25 (!) 25 (!) 24  Temp: (!) 97.3 F (36.3 C)  98.1 F (36.7 C) 98.1 F (36.7 C)  TempSrc: Axillary  Axillary Axillary  SpO2: 96% 100% 100% 100%  Weight:        Intake/Output Summary (Last 24 hours) at 09/01/2019 1236 Last data filed at 09/01/2019 0500 Gross per 24 hour  Intake 220 ml  Output 1300 ml  Net -1080 ml   Filed Weights   08/30/19 0331 08/31/19 0500 09/01/19 0500  Weight: 93.6 kg 91 kg 89.3 kg    Exam:   General: Appearance:     Overweight male in no acute distress     Lungs:     Coarse breath sounds with mild wheezing, respirations unlabored  Heart:    Normal heart rate. +JVD  MS:   All extremities  are intact. +edema  Neurologic:   Awake, alert, oriented x 3. No apparent focal neurological           defect.      Data Reviewed:   I have personally reviewed following labs and imaging studies:  Labs: Labs show the following:   Basic Metabolic Panel: Recent Labs  Lab 08/28/19 2312 08/28/19 2312 08/30/19 0526 08/31/19 0550  NA 137  --  134* 134*  K 3.8   < > 3.7 3.5  CL 96*  --  96* 97*  CO2 33*  --  29 26  GLUCOSE 89  --  84 128*  BUN 15  --  17 22  CREATININE 0.93  --  0.84 0.98  CALCIUM 8.4*  --  7.8* 7.8*  MG  --   --  1.6*  --    < > = values in this interval not displayed.   GFR Estimated Creatinine Clearance: 85.8 mL/min (by C-G formula based on SCr of 0.98 mg/dL). Liver Function Tests: Recent Labs  Lab 08/28/19 2312 08/30/19 0526  AST 14* 12*  ALT 13 13  ALKPHOS 113 77  BILITOT 0.8 0.8  PROT 5.1* 4.2*  ALBUMIN 2.6* 2.0*   No results for input(s): LIPASE, AMYLASE in the last 168 hours. No results for input(s): AMMONIA in the last 168 hours. Coagulation profile No results for input(s): INR, PROTIME in the last 168 hours.  CBC: Recent Labs  Lab 08/28/19 2312 08/30/19 0526 08/31/19 0550 09/01/19 1148  WBC 1.2* 2.0* 5.9 5.1  NEUTROABS 0.9* 1.6* 5.8  --   HGB 7.5* 6.4* 7.8* 7.7*  HCT 25.6* 21.2* 26.6* 25.1*  MCV 97.0 95.5 95.0 93.0  PLT 62* 48* 83* 71*   Cardiac Enzymes: No results for input(s): CKTOTAL, CKMB, CKMBINDEX, TROPONINI in the last 168 hours. BNP (last 3 results) No results for input(s): PROBNP in the last 8760 hours. CBG: No results for input(s): GLUCAP in the last 168 hours. D-Dimer: No results for input(s): DDIMER in the last 72 hours. Hgb A1c: No results for input(s): HGBA1C in the last 72 hours. Lipid Profile: No results for input(s): CHOL, HDL, LDLCALC, TRIG, CHOLHDL, LDLDIRECT in the last 72 hours. Thyroid function studies: No results for input(s): TSH, T4TOTAL, T3FREE, THYROIDAB in the last 72 hours.  Invalid  input(s): FREET3 Anemia work up: No results for input(s): VITAMINB12, FOLATE, FERRITIN, TIBC, IRON, RETICCTPCT in the last 72 hours. Sepsis Labs: Recent Labs  Lab 08/28/19 2312 08/29/19 0219 08/30/19 0526 08/31/19 0550 09/01/19 1148  WBC 1.2*  --  2.0* 5.9 5.1  LATICACIDVEN 2.1* 1.1  --   --   --     Microbiology Recent Results (from the past 240 hour(s))  Blood culture (routine x 2)     Status: None (Preliminary result)   Collection Time: 08/28/19 11:00 PM   Specimen: BLOOD  LEFT ARM  Result Value Ref Range Status   Specimen Description BLOOD LEFT ARM  Final   Special Requests   Final    BOTTLES DRAWN AEROBIC AND ANAEROBIC Blood Culture adequate volume   Culture   Final    NO GROWTH 3 DAYS Performed at [facility], [address], [place], [place] [address]    Report Status PENDING  Incomplete  Blood culture (routine x 2)     Status: None (Preliminary result)   Collection Time: 08/28/19 11:13 PM   Specimen: BLOOD LEFT HAND  Result Value Ref Range Status   Specimen Description BLOOD LEFT HAND  Final   Special Requests   Final    BOTTLES DRAWN AEROBIC ONLY Blood Culture adequate volume   Culture   Final    NO GROWTH 3 DAYS Performed at [facility], [place] [address] [place], [place] [address]    Report Status PENDING  Incomplete  SARS Coronavirus 2 by RT PCR (hospital order, performed in [facility] hospital lab) Nasopharyngeal Nasopharyngeal Swab     Status: None   Collection Time: 08/29/19  1:33 AM   Specimen: Nasopharyngeal Swab  Result Value Ref Range Status   SARS Coronavirus 2 NEGATIVE NEGATIVE Final    Comment: (NOTE) SARS-CoV-2 target nucleic acids are NOT DETECTED.  The SARS-CoV-2 RNA is generally detectable in upper and lower respiratory specimens during the acute phase of infection. The lowest concentration of SARS-CoV-2 viral copies this assay can detect is 250 copies / mL. A negative result does not preclude SARS-CoV-2 infection and should  not be used as the sole basis for treatment or other patient management decisions.  A negative result may occur with improper specimen collection / handling, submission of specimen other than nasopharyngeal swab, presence of viral mutation(s) within the areas targeted by this assay, and inadequate number of viral copies (<250 copies / mL). A negative result must be combined with clinical observations, patient history, and epidemiological information.  Fact Sheet for Patients:   StrictlyIdeas.no  Fact Sheet for Healthcare Providers: BankingDealers.co.za  This test is not yet approved or  cleared by the [place] FDA and has been authorized for detection and/or diagnosis of SARS-CoV-2 by FDA under an Emergency Use Authorization (EUA).  This EUA will remain in effect (meaning this test can be used) for the duration of the COVID-19 declaration under Section 564(b)(1) of the Act, 21 U.S.C. section 360bbb-3(b)(1), unless the authorization is terminated or revoked sooner.  Performed at [facility], [place] [address], [place], [place] [address]   Urine culture     Status: None   Collection Time: 08/29/19  1:36 AM   Specimen: Urine, Random  Result Value Ref Range Status   Specimen Description URINE, RANDOM  Final   Special Requests NONE  Final   Culture   Final    NO GROWTH Performed at [facility], [place] [address] [place], [place] [address]    Report Status 08/29/2019 FINAL  Final    Procedures and diagnostic studies:  DG CHEST PORT 1 VIEW  Result Date: 08/31/2019 CLINICAL DATA:  Acute respiratory distress EXAM: PORTABLE CHEST 1 VIEW COMPARISON:  08/28/2019 FINDINGS: Lung volumes are small and pulmonary insufflation has diminished since prior examination. Large right and at least small left pleural effusions are present, enlarged since prior examination. There is progressive largely perihilar interstitial infiltrate noted  within the aerated lungs bilaterally, most in keeping with progressive pulmonary edema, possibly noncardiogenic  in nature, though infectious or inflammatory infiltrate could appear similarly. No pneumothorax. Right internal jugular chest port is in place with its tip within the right atrium. Cardiac size within normal limits. No acute bone abnormality. IMPRESSION: 1. Interval increase in size of bilateral pleural effusions, large right and at least small left. 2. Progressive bilateral perihilar interstitial infiltrates, most in keeping with progressive pulmonary edema, though infectious or inflammatory infiltrate could appear similarly. 3. Diminished lung volumes. Electronically Signed   By: Fidela Salisbury MD   On: 08/31/2019 21:06    Medications:   . (feeding supplement) PROSource Plus  30 mL Oral BID BM  . sodium chloride   Intravenous Once  . amiodarone  200 mg Oral Daily  . budesonide  0.5 mg Nebulization BID  . Chlorhexidine Gluconate Cloth  6 each Topical Daily  . clopidogrel  75 mg Oral Daily  . digoxin  125 mcg Oral Daily  . enoxaparin (LOVENOX) injection  90 mg Subcutaneous Q12H  . feeding supplement (ENSURE ENLIVE)  237 mL Oral BID BM  . furosemide  80 mg Intravenous BID  . guaiFENesin  600 mg Oral BID  . ipratropium-albuterol  3 mL Nebulization BID  . lamoTRIgine  100 mg Oral BID  . levETIRAcetam  750 mg Oral BID  . midodrine  10 mg Oral TID WC  . pantoprazole  40 mg Oral Daily  . potassium chloride  40 mEq Oral Once  . sertraline  25 mg Oral Daily  . spironolactone  12.5 mg Oral Daily   Continuous Infusions:    LOS: 3 days   Geradine Girt  [facility]   How to contact the [facility] Attending or Consulting provider [place] or covering provider during after hours [place], for this patient?  1. Check the care team in [facility] and look for a) attending/consulting [facility] provider listed and b) the [facility] team listed 2. Log into [email or web address] and use [place] universal password  to access. If you do not have the password, please contact the hospital operator. 3. Locate the [facility] provider you are looking for under [facility] and page to a number that you can be directly reached. 4. If you still have difficulty reaching the provider, please page the [facility] (Director on Call) for the Hospitalists listed on amion for assistance.  09/01/2019, 12:36 PM

## 2019-09-01 NOTE — Plan of Care (Signed)

## 2019-09-01 NOTE — Progress Notes (Signed)
[place] for enoxaparin Indication: DVT  Patient Measurements: Weight 88.5 kg (195 lb 3 oz) Height 6' (182.9 cm)  Vital Signs: Temp: 98.1 F (36.7 C) (08/14 1050) Temp Source: Axillary (08/14 1050) BP: 100/82 (08/14 1050) Pulse Rate: 63 (08/14 1050)  Labs: Recent Labs    08/30/19 0526 08/31/19 0550  HGB 6.4* 7.8*  HCT 21.2* 26.6*  PLT 48* 83*  CREATININE 0.84 0.98    Medications:  Scheduled:  . (feeding supplement) PROSource Plus  30 mL Oral BID BM  . sodium chloride   Intravenous Once  . amiodarone  200 mg Oral Daily  . budesonide  0.5 mg Nebulization BID  . Chlorhexidine Gluconate Cloth  6 each Topical Daily  . clopidogrel  75 mg Oral Daily  . digoxin  125 mcg Oral Daily  . enoxaparin (LOVENOX) injection  90 mg Subcutaneous Q12H  . feeding supplement (ENSURE ENLIVE)  237 mL Oral BID BM  . furosemide  40 mg Intravenous BID  . guaiFENesin  600 mg Oral BID  . ipratropium-albuterol  3 mL Nebulization BID  . lamoTRIgine  100 mg Oral BID  . levETIRAcetam  750 mg Oral BID  . midodrine  10 mg Oral TID WC  . pantoprazole  40 mg Oral Daily  . sertraline  25 mg Oral Daily     Assessment: 64 yo male with a history of lymphoma presents with shortness of breath and fever. The patient was found to have neutropenia and a fever. The patient has a venous duplex that was found to be positive for acute deep vein thrombosis involving the right subclavian vein, 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 The CTA was negative for pulmonary embolism. PTA the patient is not taking any anticoagulation.  Pharmacy is now consulted to dose therapeutic enoxaparin for DVT. There are no signs or symptoms of bleeding documented.   Goal of Therapy:  Anti-Xa level 0.6-1 units/ml 4hrs after LMWH dose given Monitor platelets by anticoagulation protocol: Yes    Plan:  Continue enoxaparin 90 mg SubQ BID Monitor s/sx bleeding F/u plan for oral  anticoagulation  Harvel Quale 09/01/2019 11:29 AM

## 2019-09-01 NOTE — Progress Notes (Signed)
PT Cancellation Note  Patient Details Name: Tony Saunders MRN: [id number] DOB: October 06, 1955   Cancelled Treatment:    Reason Eval/Treat Not Completed: Patient declined, no reason specified. Pt declining PT session at this time due to fatigue. Pt even refusing AROM exercise at bed level. PT will attempt to follow up at a later time when pt is more agreeable and less fatigued.   Zenaida Niece 09/01/2019, 9:58 AM

## 2019-09-01 NOTE — Progress Notes (Signed)
Advanced Heart Failure Rounding Note   Subjective:    Says he feels better this am. On Bipap for sats in the 80s.  Denies dyspnea, orthopnea or PND. I/Os -1L but weight down 4 pounds.  SBP stable ~ 100. Creatinine stable.    Objective:   Weight Range:  Vital Signs:   Temp:  [97.3 F (36.3 C)-98.1 F (36.7 C)] 98.1 F (36.7 C) (08/14 1050) Pulse Rate:  [61-97] 63 (08/14 1050) Resp:  [17-38] 25 (08/14 1050) BP: (90-137)/(54-93) 100/82 (08/14 1050) SpO2:  [81 %-100 %] 100 % (08/14 1050) FiO2 (%):  [40 %-100 %] 40 % (08/14 0749) Weight:  [89.3 kg] 89.3 kg (08/14 0500) Last BM Date: 08/31/19  Weight change: Filed Weights   08/30/19 0331 08/31/19 0500 09/01/19 0500  Weight: 93.6 kg 91 kg 89.3 kg    Intake/Output:   Intake/Output Summary (Last 24 hours) at 09/01/2019 1117 Last data filed at 09/01/2019 0500 Gross per 24 hour  Intake 340 ml  Output 1300 ml  Net -960 ml     Physical Exam: General:  Weak appearing. No resp difficulty HEENT: normal Neck: supple. JVP to jaw Carotids 2+ bilat; no bruits. No lymphadenopathy or thryomegaly appreciated. Cor: PMI nondisplaced. Irregular rate & rhythm. 2/6 MR Lungs: clear Abdomen: soft, nontender, nondistended. No hepatosplenomegaly. No bruits or masses. Good bowel sounds. Extremities: no cyanosis, clubbing, rash, 2-3+ edema into thighs  + UNNA boots Neuro: alert & orientedx3, cranial nerves grossly intact. moves all 4 extremities w/o difficulty. Affect pleasant  Telemetry: AFL 60s Personally reviewed   Labs: Basic Metabolic Panel: Recent Labs  Lab 08/28/19 2312 08/30/19 0526 08/31/19 0550  NA 137 134* 134*  K 3.8 3.7 3.5  CL 96* 96* 97*  CO2 33* 29 26  GLUCOSE 89 84 128*  BUN 15 17 22   CREATININE 0.93 0.84 0.98  CALCIUM 8.4* 7.8* 7.8*  MG  --  1.6*  --     Liver Function Tests: Recent Labs  Lab 08/28/19 2312 08/30/19 0526  AST 14* 12*  ALT 13 13  ALKPHOS 113 77  BILITOT 0.8 0.8  PROT 5.1* 4.2*    ALBUMIN 2.6* 2.0*   No results for input(s): LIPASE, AMYLASE in the last 168 hours. No results for input(s): AMMONIA in the last 168 hours.  CBC: Recent Labs  Lab 08/28/19 2312 08/30/19 0526 08/31/19 0550  WBC 1.2* 2.0* 5.9  NEUTROABS 0.9* 1.6* 5.8  HGB 7.5* 6.4* 7.8*  HCT 25.6* 21.2* 26.6*  MCV 97.0 95.5 95.0  PLT 62* 48* 83*    Cardiac Enzymes: No results for input(s): CKTOTAL, CKMB, CKMBINDEX, TROPONINI in the last 168 hours.  BNP: BNP (last 3 results) Recent Labs    07/13/19 0200 07/14/19 0500 07/16/19 0629  BNP 608.0* 608.8* 675.9*    ProBNP (last 3 results) No results for input(s): PROBNP in the last 8760 hours.    Other results:  Imaging: DG CHEST PORT 1 VIEW  Result Date: 08/31/2019 CLINICAL DATA:  Acute respiratory distress EXAM: PORTABLE CHEST 1 VIEW COMPARISON:  08/28/2019 FINDINGS: Lung volumes are small and pulmonary insufflation has diminished since prior examination. Large right and at least small left pleural effusions are present, enlarged since prior examination. There is progressive largely perihilar interstitial infiltrate noted within the aerated lungs bilaterally, most in keeping with progressive pulmonary edema, possibly noncardiogenic in nature, though infectious or inflammatory infiltrate could appear similarly. No pneumothorax. Right internal jugular chest port is in place with its tip within  the right atrium. Cardiac size within normal limits. No acute bone abnormality. IMPRESSION: 1. Interval increase in size of bilateral pleural effusions, large right and at least small left. 2. Progressive bilateral perihilar interstitial infiltrates, most in keeping with progressive pulmonary edema, though infectious or inflammatory infiltrate could appear similarly. 3. Diminished lung volumes. Electronically Signed   By: Fidela Salisbury MD   On: 08/31/2019 21:06      Medications:     Scheduled Medications: . (feeding supplement) PROSource Plus  30 mL  Oral BID BM  . sodium chloride   Intravenous Once  . amiodarone  200 mg Oral Daily  . budesonide  0.5 mg Nebulization BID  . Chlorhexidine Gluconate Cloth  6 each Topical Daily  . clopidogrel  75 mg Oral Daily  . digoxin  125 mcg Oral Daily  . enoxaparin (LOVENOX) injection  90 mg Subcutaneous Q12H  . feeding supplement (ENSURE ENLIVE)  237 mL Oral BID BM  . furosemide  40 mg Intravenous BID  . guaiFENesin  600 mg Oral BID  . ipratropium-albuterol  3 mL Nebulization BID  . lamoTRIgine  100 mg Oral BID  . levETIRAcetam  750 mg Oral BID  . midodrine  10 mg Oral TID WC  . pantoprazole  40 mg Oral Daily  . sertraline  25 mg Oral Daily     Infusions:   PRN Medications:  acetaminophen **OR** acetaminophen, loperamide, morphine injection, oxyCODONE, oxymetazoline, polyethylene glycol, prochlorperazine   Assessment/Plan:   1. Acute on Chronic Systolic Heart Failure - 2/2 ICM, recent MI 3/21 w/ high grade LAD, LCx and RCA lesions, all treated w/ PCI +DES - Echo 6/21 EF 35-40%. RV normal. Severe MR  - multiple recent admissions for a/c CHF c/b low output, required milrinone + levophed last admit 6-7/21 (d/c wt 172 lb) - now w/ recurrent a/c CHF + volume overload. Wt now up to 200 lb and diuresis has been limited by hypotension w/ SBPs in low 90s. SCr ok 0.98.  - doubt low output HF. Suspect low BP is secondary to intravascular volume depletion w/ hypoalbuminemia contributing (albumin 2.0)  - starting to diurese well but still a way to go.  - Continue unna boots. Increase lasix to 80IV bid - Midodrine increased for BP support - Supp K  2. Acute RUE DVT - UE Venous doppler 8/11 + for DVT - Chest CT negative for PE - in the setting of malignancy/ hypercoagulable state - now on Lovenox, management per primary team  - hgb 6.4 -> 7.8  3. Persistent Atrial Flutter - failed DC-CVseveral times recently - currently rate controlled in the 60-70s  - may consider repeat attempt in the  future, once he is back on a/c  5. Severe MR - felt to be ischemic MR - May be possible MitraClip candidate depending on lymphoma course  6. High Grade Lymphoma - diagnosed 6/21.  - CT w/ extensive retroperitoneal, pelvic, and inguinal lymphadenopathy. There is also involvement of the spleen and near complete infiltration of the right kidney. 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 Suspicious for infiltrating disease of the right iliac and left pubic bones.  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 Alen Blew. Most recent f/u 8/2. Notation regarding prognosis, "Prognosis: Aggressive therapy remains warranted at this time. His disease remains curable potentially if he achieves a complete response upon completing chemotherapy".   7. Neutropenic fever -  on chemotherapy for lymphoma - WBC 1.2 on admit, mTemp of 101 at home. Lactic acid 2.2 on admit - on empiric abx w/ vanc + cefepime  - CXR - for PNA. UA -  - Urine and Blood Cultures NGTD - fevers resolved. WBC 5.9, lactic acid normalized  - abx duration per primary team/oncology   8. CAD:  - s/p pRCA PCI in 2006 - s/p recent MI with multiple stents in 3/21 (RCA x 2, LCX x 3, LAD x 1) - did not tolerate statin w/ elevated LFTs - not on ? blocker w/ recent low output HF / hypotension  - had not been on ASA given Plavix + Xarelto use (discontinued 7/21)  9. Bleeding - Xarelto was discontinued 7/21 due to hemoptysis and epistaxis - required transfusion x 2 this admit for hgb of 6.4,. Now 7.8 - now w/ RUE DVT and will need long term a/c. Consider switch to Eliquis with lower risk of bleeding  - also requiring Plavix for CAD w/ recent PCI to RCA, LAD and LCx 3/21  - Can stop Plavix if DOAC restarted.     Length of Stay: 3   Glori Bickers MD 09/01/2019, 11:17 AM  Advanced Heart Failure Team Pager [phone] (M-F; [place])  Please contact [facility] for night-coverage after hours (4p -7a ) and weekends on [email or web address]

## 2019-09-02 LAB — CBC
HCT: 25 % — ABNORMAL LOW (ref 39.0–52.0)
Hemoglobin: 7.4 g/dL — ABNORMAL LOW (ref 13.0–17.0)
MCH: 27.9 pg (ref 26.0–34.0)
MCHC: 29.6 g/dL — ABNORMAL LOW (ref 30.0–36.0)
MCV: 94.3 fL (ref 80.0–100.0)
Platelets: 82 10*3/uL — ABNORMAL LOW (ref 150–400)
RBC: 2.65 MIL/uL — ABNORMAL LOW (ref 4.22–5.81)
RDW: 18.5 % — ABNORMAL HIGH (ref 11.5–15.5)
WBC: 6.2 10*3/uL (ref 4.0–10.5)
nRBC: 0 % (ref 0.0–0.2)

## 2019-09-02 LAB — BASIC METABOLIC PANEL WITH GFR
Anion gap: 10 (ref 5–15)
BUN: 20 mg/dL (ref 8–23)
CO2: 31 mmol/L (ref 22–32)
Calcium: 7.5 mg/dL — ABNORMAL LOW (ref 8.9–10.3)
Chloride: 97 mmol/L — ABNORMAL LOW (ref 98–111)
Creatinine, Ser: 1.19 mg/dL (ref 0.61–1.24)
GFR calc Af Amer: >60 mL/min
GFR calc non Af Amer: >60 mL/min
Glucose, Bld: 82 mg/dL (ref 70–99)
Potassium: 3.2 mmol/L — ABNORMAL LOW (ref 3.5–5.1)
Sodium: 138 mmol/L (ref 135–145)

## 2019-09-02 LAB — MAGNESIUM: Magnesium: 1.9 mg/dL (ref 1.7–2.4)

## 2019-09-02 LAB — PREPARE RBC (CROSSMATCH)

## 2019-09-02 MED ORDER — POTASSIUM CHLORIDE CRYS ER 10 MEQ PO TBCR
40.0000 meq | EXTENDED_RELEASE_TABLET | Freq: Every day | ORAL | Status: DC
  Administered 2019-09-02: 40 meq via ORAL
  Filled 2019-09-02: qty 4

## 2019-09-02 MED ORDER — POTASSIUM CHLORIDE CRYS ER 10 MEQ PO TBCR
40.0000 meq | EXTENDED_RELEASE_TABLET | Freq: Once | ORAL | Status: AC
  Administered 2019-09-02: 40 meq via ORAL
  Filled 2019-09-02: qty 4

## 2019-09-02 MED ORDER — SODIUM CHLORIDE 0.9% IV SOLUTION: Freq: Once | INTRAVENOUS | Status: DC

## 2019-09-02 MED ORDER — METOLAZONE 2.5 MG PO TABS
2.5000 mg | ORAL_TABLET | Freq: Once | ORAL | Status: AC
  Administered 2019-09-02: 2.5 mg via ORAL
  Filled 2019-09-02: qty 1

## 2019-09-02 MED ORDER — POTASSIUM CHLORIDE CRYS ER 10 MEQ PO TBCR
40.0000 meq | EXTENDED_RELEASE_TABLET | Freq: Once | ORAL | Status: AC
  Administered 2019-09-03: 40 meq via ORAL

## 2019-09-02 MED ORDER — MAGNESIUM SULFATE 2 GM/50ML IV SOLN
2.0000 g | Freq: Once | INTRAVENOUS | Status: AC
  Administered 2019-09-06: 2 g via INTRAVENOUS
  Filled 2019-09-02 (×2): qty 50

## 2019-09-02 MED ORDER — POTASSIUM CHLORIDE CRYS ER 20 MEQ PO TBCR: 40.0000 meq | EXTENDED_RELEASE_TABLET | Freq: Once | ORAL | Status: DC

## 2019-09-02 NOTE — Progress Notes (Signed)
RT placed pt back on HFNC [place] on 7lpm for the night. Pt respiratory status is stable at this time. Pt not in need of BIPAP PRN at this time. RT will continue to monitor.

## 2019-09-02 NOTE — Evaluation (Signed)
Physical Therapy Evaluation Patient Details Name: Tony Saunders MRN: [id number] DOB: September 26, 1955 Today's Date: 09/02/2019   History of Present Illness  Tony Saunders is an 64 y.o. male with history of severe mitral regurgitation, seizure, paroxysmal atrial fibrillation, myocardial infarction, diffuse large B-cell lymphoma, CHF, CAD presents to the ER with a chief complaint of right hand pain and swelling.   Clinical Impression  Pt admitted with above diagnosis. Pt was able to sit EOB for up to 15 minutes with good stability in sitting.  Did desat once laid back down on 6LO2 therefore nursing came in and placed Bipap on pt. Given pts low endurance for activity, reocmmend SNF for rehab prior to d/c home.  Pt currently with functional limitations due to the deficits listed below (see PT Problem List). Pt will benefit from skilled PT to increase their independence and safety with mobility to allow discharge to the venue listed below.      Follow Up Recommendations SNF;Supervision/Assistance - 24 hour (If has 24 hour care, HHPT is appropriate)    Equipment Recommendations  None recommended by PT    Recommendations for Other Services       Precautions / Restrictions Precautions Precautions: Fall Restrictions Weight Bearing Restrictions: No      Mobility  Bed Mobility Overal bed mobility: Needs Assistance Bed Mobility: Supine to Sit;Sit to Supine     Supine to sit: Min assist;Mod assist;+2 for physical assistance Sit to supine: Mod assist;+2 for physical assistance   General bed mobility comments: Assist to initiate LEs and for elevation of trunk with need for incr time to come to eOB.   Transfers                 General transfer comment: Unable to stand due to fatigue sitting EOB  Ambulation/Gait             General Gait Details: unable today  Stairs            Wheelchair Mobility    Modified Rankin (Stroke Patients Only)       Balance  Overall balance assessment: Needs assistance Sitting-balance support: No upper extremity supported;Feet supported Sitting balance-Leahy Scale: Fair Sitting balance - Comments: Sat EOB for 15 minutes and can perform LE exercises.  Sats on Bipap were 100%. Nurse came in and placed pt on 6L per pt request with sats 94%.  Once PT laid pt down, sats down to 81% on 6L therefore nurse came in and replaced Bipap.                                       Pertinent Vitals/Pain Pain Assessment: Faces Faces Pain Scale: Hurts even more Pain Location: right UE and generlized pain Pain Descriptors / Indicators: Grimacing;Guarding;Discomfort;Aching Pain Intervention(s): Limited activity within patient's tolerance;Monitored during session;Repositioned;RN gave pain meds during session    [place] expects to be discharged to:: Private residence Living Arrangements: Spouse/significant other;Children Available Help at Discharge: Family;Available 24 hours/day Type of Home: House Home Access: Stairs to enter Entrance Stairs-Rails: None Entrance Stairs-Number of Steps: 1 Home Layout: Two level;1/2 bath on main level;Able to live on main level with bedroom/bathroom Home Equipment: Walker - standard;Wheelchair - manual;Bedside commode      Prior Function Level of Independence: Needs assistance   Gait / Transfers Assistance Needed: Reports he needs min to mod assist with ambulation with SW and sometimes uses  wheelchair due to this.  Stays on 1 level of home.    ADL's / Homemaking Assistance Needed: Sponge bathes on main level with assist of spouse  Comments: Pt uses walker in home and w/c for distance.  Single step into home with walker vs wc dependent on pt ability on given day     Hand Dominance   Dominant Hand: Right    Extremity/Trunk Assessment   Upper Extremity Assessment Upper Extremity Assessment: Defer to OT evaluation    Lower Extremity Assessment Lower  Extremity Assessment: Generalized weakness    Cervical / Trunk Assessment Cervical / Trunk Assessment: Kyphotic  Communication   Communication: No difficulties  Cognition Arousal/Alertness: Awake/alert Behavior During Therapy: Flat affect Overall Cognitive Status: Within Functional Limits for tasks assessed                                        General Comments      Exercises General Exercises - Lower Extremity Ankle Circles/Pumps: AROM;Both;10 reps;Seated Long Arc Quad: AROM;Both;10 reps;Seated Hip Flexion/Marching: AROM;Both;10 reps;Seated   Assessment/Plan    PT Assessment Patient needs continued PT services  PT Problem List Decreased activity tolerance;Decreased balance;Decreased mobility;Decreased knowledge of use of DME;Decreased safety awareness;Decreased knowledge of precautions;Cardiopulmonary status limiting activity       PT Treatment Interventions DME instruction;Gait training;Functional mobility training;Therapeutic activities;Therapeutic exercise;Balance training;Patient/family education;Wheelchair mobility training    PT Goals (Current goals can be found in the Care Plan section)  Acute Rehab PT Goals Patient Stated Goal: to go home  PT Goal Formulation: With patient Time For Goal Achievement: 08/26/2019 Potential to Achieve Goals: Fair    Frequency Min 3X/week   Barriers to discharge Decreased caregiver support      Co-evaluation               AM-PAC PT "6 Clicks" Mobility  Outcome Measure Help needed turning from your back to your side while in a flat bed without using bedrails?: A Little Help needed moving from lying on your back to sitting on the side of a flat bed without using bedrails?: A Lot Help needed moving to and from a bed to a chair (including a wheelchair)?: Total Help needed standing up from a chair using your arms (e.g., wheelchair or bedside chair)?: Total Help needed to walk in hospital room?: Total Help  needed climbing 3-5 steps with a railing? : Total 6 Click Score: 9    End of Session Equipment Utilized During Treatment: Gait belt;Oxygen Activity Tolerance: Patient limited by fatigue Patient left: in bed;with call bell/phone within reach;with bed alarm set Nurse Communication: Mobility status PT Visit Diagnosis: Muscle weakness (generalized) (M62.81)    Time: 1601-0932 PT Time Calculation (min) (ACUTE ONLY): 25 min   Charges:   PT Evaluation $PT Eval Moderate Complexity: 1 Mod PT Treatments $Therapeutic Activity: 8-22 mins        Nyesha Cliff W,PT Acute Rehabilitation Services Pager:  [phone]  Office:  [phone]    Denice Paradise 09/02/2019, 1:59 PM

## 2019-09-02 NOTE — Progress Notes (Addendum)
Progress Note    Tony Saunders  ZOX:[id number] DOB: [birth year]  DOA: 08/28/2019 PCP: [name], [name], MD    Brief Narrative:     Medical records reviewed and are as summarized below:  Tony Saunders is an 64 y.o. male with history of severe mitral regurgitation, seizure, paroxysmal atrial fibrillation, myocardial infarction, diffuse large B-cell lymphoma, CHF, CAD presents to the ER with a chief complaint of right hand pain and swelling.   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 Weaned down to 2L nasal cannula; BIPAP PRN 2. Chest x-ray shows decreased bilateral pleural effusions-improved from last film per reading 3. Broad-spectrum antibiotics will cover any possible pneumonia 4. With current diagnosis of lymphoma, borderline hypotension blood pressure at 103/66, CT angio of chest: Stable large bilateral pleural effusions and diffuse ground-glass attenuation. Findings are consistent with congestive heart failure. Last echo 6/21: EF 35-40%.  During prior hospitalization required milrinone-- cardiology consult appreciated: I Vlasix and midodrine 5. gave 1 unit PRBC--8/12, will give another unit 8/15 6. Pulmonary toilet 7. Weight down but I/Os still +  Neutropenic fever with high grade lymphoma 1. White blood cell count 1.2 upon admission but trending up 2. T-max at home 101.7- no further temperature 3. Oncology consulted and recommended cefepime and vancomycin- if cultures negative in 24 hours, can stop abx-- will place stop date of 8/13 4. No source of infection identified at this time 5. Covid negative,blood cultures  neg,urine culture neg,continue monitor  Right upper extremity DVT- port a cath placed 7/26 1. Duplex shows acute clot 2. lovenox for now-  was previously on xarelto but this was stopped in in June due to anemia and epistaxis 3. Will ask TOC to check eliquis vs xarelto  Protein calorie malnutrition 1. Feeding supplement  Hypomagnesemia/hypokalemia -repleted  Anemia -s/p 1 unit prbc  Persistent atrial flutter -rate controlled -per cards, has failed cardioversion in past  Severe MR -per cards  Discussed with Dr. Haroldine Laws  Family Communication/Anticipated D/C date and plan/Code Status   DVT prophylaxis: Lovenox full dose Code Status: Full Code.  Family Communication: wife at bedside 8/12; hallway 8/14 Disposition Plan: Status is: Inpatient  Remains inpatient appropriate because:Inpatient level of care appropriate due to severity of illness   Dispo: The patient is from: Home              Anticipated d/c is to: Home              Anticipated d/c date is: 3 days              Patient currently is not medically stable to d/c.         Medical Consultants:    Oncology  cardiology     Subjective:   Was able to sit on side of bed with PT  Objective:    Vitals:   09/02/19 0800 09/02/19 0900 09/02/19 1000 09/02/19 1200  BP: (!) 104/58 (!) 97/56 (!) 101/59 (!) 95/48  Pulse: (!) 134 60 62 61  Resp: (!) 37 (!) 21 (!) 25 (!) 26  Temp: 98.4 F (36.9 C)     TempSrc: Axillary  SpO2: 93% 100% 100% 99%  Weight:        Intake/Output Summary (Last 24 hours) at 09/02/2019 1206 Last data filed at 09/02/2019 1203 Gross per 24 hour  Intake 1655 ml  Output 1750 ml  Net -95 ml   Filed Weights   08/31/19 0500 09/01/19 0500 09/02/19 0454  Weight: 91 kg 89.3 kg 90.4 kg    Exam:   General: Appearance:     Overweight ill appearing male on [place]     Lungs:     Coarse breath sounds  Heart:    Normal heart rate.   MS:   All extremities are intact. Edema  improving in lower ext  Neurologic:   Awake, alert, oriented x 3. No apparent focal neurological           defect.      Data Reviewed:   I have personally reviewed following labs and imaging studies:  Labs: Labs show the following:   Basic Metabolic Panel: Recent Labs  Lab 08/28/19 2312 08/28/19 2312 08/30/19 0526 08/30/19 0526 08/31/19 0550 08/31/19 0550 09/01/19 1148 09/02/19 0418  NA 137  --  134*  --  134*  --  136 138  K 3.8   < > 3.7   < > 3.5   < > 3.0* 3.2*  CL 96*  --  96*  --  97*  --  96* 97*  CO2 33*  --  29  --  26  --  29 31  GLUCOSE 89  --  84  --  128*  --  82 82  BUN 15  --  17  --  22  --  22 20  CREATININE 0.93  --  0.84  --  0.98  --  1.15 1.19  CALCIUM 8.4*  --  7.8*  --  7.8*  --  7.5* 7.5*  MG  --   --  1.6*  --   --   --   --  1.9   < > = values in this interval not displayed.   GFR Estimated Creatinine Clearance: 70.6 mL/min (by C-G formula based on SCr of 1.19 mg/dL). Liver Function Tests: Recent Labs  Lab 08/28/19 2312 08/30/19 0526  AST 14* 12*  ALT 13 13  ALKPHOS 113 77  BILITOT 0.8 0.8  PROT 5.1* 4.2*  ALBUMIN 2.6* 2.0*   No results for input(s): LIPASE, AMYLASE in the last 168 hours. No results for input(s): AMMONIA in the last 168 hours. Coagulation profile No results for input(s): INR, PROTIME in the last 168 hours.  CBC: Recent Labs  Lab 08/28/19 2312 08/30/19 0526 08/31/19 0550 09/01/19 1148 09/02/19 0418  WBC 1.2* 2.0* 5.9 5.1 6.2  NEUTROABS 0.9* 1.6* 5.8  --   --   HGB 7.5* 6.4* 7.8* 7.7* 7.4*  HCT 25.6* 21.2* 26.6* 25.1* 25.0*  MCV 97.0 95.5 95.0 93.0 94.3  PLT 62* 48* 83* 71* 82*   Cardiac Enzymes: No results for input(s): CKTOTAL, CKMB, CKMBINDEX, TROPONINI in the last 168 hours. BNP (last 3 results) No results for input(s): PROBNP in the last 8760 hours. CBG: No results for input(s): GLUCAP in the last 168 hours. D-Dimer: No results for input(s): DDIMER in the last 72 hours. Hgb A1c: No results for  input(s): HGBA1C in the last 72 hours. Lipid Profile: No results for input(s): CHOL, HDL, LDLCALC, TRIG, CHOLHDL, LDLDIRECT in the last 72 hours. Thyroid function studies: No results for input(s): TSH, T4TOTAL, T3FREE, THYROIDAB in the  last 72 hours.  Invalid input(s): FREET3 Anemia work up: No results for input(s): VITAMINB12, FOLATE, FERRITIN, TIBC, IRON, RETICCTPCT in the last 72 hours. Sepsis Labs: Recent Labs  Lab 08/28/19 2312 08/28/19 2312 08/29/19 0219 08/30/19 0526 08/31/19 0550 09/01/19 1148 09/02/19 0418  WBC 1.2*   < >  --  2.0* 5.9 5.1 6.2  LATICACIDVEN 2.1*  --  1.1  --   --   --   --    < > = values in this interval not displayed.    Microbiology Recent Results (from the past 240 hour(s))  Blood culture (routine x 2)     Status: None (Preliminary result)   Collection Time: 08/28/19 11:00 PM   Specimen: BLOOD LEFT ARM  Result Value Ref Range Status   Specimen Description BLOOD LEFT ARM  Final   Special Requests   Final    BOTTLES DRAWN AEROBIC AND ANAEROBIC Blood Culture adequate volume   Culture   Final    NO GROWTH 4 DAYS Performed at [facility], [place] [address] [place], [place] [address]    Report Status PENDING  Incomplete  Blood culture (routine x 2)     Status: None (Preliminary result)   Collection Time: 08/28/19 11:13 PM   Specimen: BLOOD LEFT HAND  Result Value Ref Range Status   Specimen Description BLOOD LEFT HAND  Final   Special Requests   Final    BOTTLES DRAWN AEROBIC ONLY Blood Culture adequate volume   Culture   Final    NO GROWTH 4 DAYS Performed at [facility], [place] [address] [place], [place] [address]    Report Status PENDING  Incomplete  SARS Coronavirus 2 by RT PCR (hospital order, performed in [facility] hospital lab) Nasopharyngeal Nasopharyngeal Swab     Status: None   Collection Time: 08/29/19  1:33 AM   Specimen: Nasopharyngeal Swab  Result Value Ref Range Status   SARS Coronavirus 2 NEGATIVE NEGATIVE  Final    Comment: (NOTE) SARS-CoV-2 target nucleic acids are NOT DETECTED.  The SARS-CoV-2 RNA is generally detectable in upper and lower respiratory specimens during the acute phase of infection. The lowest concentration of SARS-CoV-2 viral copies this assay can detect is 250 copies / mL. A negative result does not preclude SARS-CoV-2 infection and should not be used as the sole basis for treatment or other patient management decisions.  A negative result may occur with improper specimen collection / handling, submission of specimen other than nasopharyngeal swab, presence of viral mutation(s) within the areas targeted by this assay, and inadequate number of viral copies (<250 copies / mL). A negative result must be combined with clinical observations, patient history, and epidemiological information.  Fact Sheet for Patients:   StrictlyIdeas.no  Fact Sheet for Healthcare Providers: BankingDealers.co.za  This test is not yet approved or  cleared by the [place] FDA and has been authorized for detection and/or diagnosis of SARS-CoV-2 by FDA under an Emergency Use Authorization (EUA).  This EUA will remain in effect (meaning this test can be used) for the duration of the COVID-19 declaration under Section 564(b)(1) of the Act, 21 U.S.C. section 360bbb-3(b)(1), unless the authorization is terminated or revoked sooner.  Performed at [facility], [place] [address], [place], [place] [address]   Urine culture     Status: None   Collection Time: 08/29/19  1:36 AM   Specimen: Urine, Random  Result Value Ref Range Status   Specimen Description URINE,  RANDOM  Final   Special Requests NONE  Final   Culture   Final    NO GROWTH Performed at [facility], [place] [address], [place], [place] [address]    Report Status 08/29/2019 FINAL  Final    Procedures and diagnostic studies:  DG CHEST PORT 1 VIEW  Result Date:  08/31/2019 CLINICAL DATA:  Acute respiratory distress EXAM: PORTABLE CHEST 1 VIEW COMPARISON:  08/28/2019 FINDINGS: Lung volumes are small and pulmonary insufflation has diminished since prior examination. Large right and at least small left pleural effusions are present, enlarged since prior examination. There is progressive largely perihilar interstitial infiltrate noted within the aerated lungs bilaterally, most in keeping with progressive pulmonary edema, possibly noncardiogenic in nature, though infectious or inflammatory infiltrate could appear similarly. No pneumothorax. Right internal jugular chest port is in place with its tip within the right atrium. Cardiac size within normal limits. No acute bone abnormality. IMPRESSION: 1. Interval increase in size of bilateral pleural effusions, large right and at least small left. 2. Progressive bilateral perihilar interstitial infiltrates, most in keeping with progressive pulmonary edema, though infectious or inflammatory infiltrate could appear similarly. 3. Diminished lung volumes. Electronically Signed   By: Fidela Salisbury MD   On: 08/31/2019 21:06    Medications:   . (feeding supplement) PROSource Plus  30 mL Oral BID BM  . sodium chloride   Intravenous Once  . sodium chloride   Intravenous Once  . amiodarone  200 mg Oral Daily  . budesonide  0.5 mg Nebulization BID  . Chlorhexidine Gluconate Cloth  6 each Topical Daily  . clopidogrel  75 mg Oral Daily  . digoxin  125 mcg Oral Daily  . enoxaparin (LOVENOX) injection  90 mg Subcutaneous Q12H  . feeding supplement (ENSURE ENLIVE)  237 mL Oral BID BM  . furosemide  80 mg Intravenous BID  . guaiFENesin  600 mg Oral BID  . ipratropium-albuterol  3 mL Nebulization BID  . lamoTRIgine  100 mg Oral BID  . levETIRAcetam  750 mg Oral BID  . midodrine  10 mg Oral TID WC  . pantoprazole  40 mg Oral Daily  . sertraline  25 mg Oral Daily  . spironolactone  12.5 mg Oral Daily   Continuous  Infusions:    LOS: 4 days   Geradine Girt  [facility]   How to contact the [facility] Attending or Consulting provider [place] or covering provider during after hours [place], for this patient?  1. Check the care team in [facility] and look for a) attending/consulting [facility] provider listed and b) the [facility] team listed 2. Log into [email or web address] and use [place]'s universal password to access. If you do not have the password, please contact the hospital operator. 3. Locate the [facility] provider you are looking for under [facility] and page to a number that you can be directly reached. 4. If you still have difficulty reaching the provider, please page the [facility] (Director on Call) for the Hospitalists listed on amion for assistance.  09/02/2019, 12:06 PM

## 2019-09-02 NOTE — Progress Notes (Signed)
Advanced Heart Failure Rounding Note   Subjective:    Denies CP or SOB. Lasix increased yesterday. Weight up 3 pounds but diuresing well  SBP low 95-100s   BUN/CR stable. K 3.2    Objective:   Weight Range:  Vital Signs:   Temp:  [97.5 F (36.4 C)-98.4 F (36.9 C)] 98.4 F (36.9 C) (08/15 0800) Pulse Rate:  [60-134] 62 (08/15 1000) Resp:  [21-37] 25 (08/15 1000) BP: (91-112)/(53-82) 101/59 (08/15 1000) SpO2:  [93 %-100 %] 100 % (08/15 1000) FiO2 (%):  [40 %-48 %] 48 % (08/15 0748) Weight:  [90.4 kg] 90.4 kg (08/15 0454) Last BM Date: 09/01/19  Weight change: Filed Weights   08/31/19 0500 09/01/19 0500 09/02/19 0454  Weight: 91 kg 89.3 kg 90.4 kg    Intake/Output:   Intake/Output Summary (Last 24 hours) at 09/02/2019 1026 Last data filed at 09/02/2019 1000 Gross per 24 hour  Intake 1340 ml  Output 1750 ml  Net -410 ml     Physical Exam: General:  Weak appearing. No resp difficulty HEENT: normal Neck: supple. JVP to jaw Carotids 2+ bilat; no bruits. No lymphadenopathy or thryomegaly appreciated. Cor: PMI nondisplaced. Irregular rate & rhythm. 2/6 MR + port Lungs: clear Abdomen: soft, nontender, nondistended. No hepatosplenomegaly. No bruits or masses. Good bowel sounds. Extremities: no cyanosis, clubbing, rash, 2+ edema + UNNA Neuro: alert & orientedx3, cranial nerves grossly intact. moves all 4 extremities w/o difficulty. Affect pleasant  Telemetry: AFL 60s Personally reviewed    Labs: Basic Metabolic Panel: Recent Labs  Lab 08/28/19 2312 08/28/19 2312 08/30/19 0526 08/30/19 0526 08/31/19 0550 09/01/19 1148 09/02/19 0418  NA 137  --  134*  --  134* 136 138  K 3.8  --  3.7  --  3.5 3.0* 3.2*  CL 96*  --  96*  --  97* 96* 97*  CO2 33*  --  29  --  26 29 31   GLUCOSE 89  --  84  --  128* 82 82  BUN 15  --  17  --  22 22 20   CREATININE 0.93  --  0.84  --  0.98 1.15 1.19  CALCIUM 8.4*   < > 7.8*   < > 7.8* 7.5* 7.5*  MG  --   --  1.6*  --   --    --  1.9   < > = values in this interval not displayed.    Liver Function Tests: Recent Labs  Lab 08/28/19 2312 08/30/19 0526  AST 14* 12*  ALT 13 13  ALKPHOS 113 77  BILITOT 0.8 0.8  PROT 5.1* 4.2*  ALBUMIN 2.6* 2.0*   No results for input(s): LIPASE, AMYLASE in the last 168 hours. No results for input(s): AMMONIA in the last 168 hours.  CBC: Recent Labs  Lab 08/28/19 2312 08/30/19 0526 08/31/19 0550 09/01/19 1148 09/02/19 0418  WBC 1.2* 2.0* 5.9 5.1 6.2  NEUTROABS 0.9* 1.6* 5.8  --   --   HGB 7.5* 6.4* 7.8* 7.7* 7.4*  HCT 25.6* 21.2* 26.6* 25.1* 25.0*  MCV 97.0 95.5 95.0 93.0 94.3  PLT 62* 48* 83* 71* 82*    Cardiac Enzymes: No results for input(s): CKTOTAL, CKMB, CKMBINDEX, TROPONINI in the last 168 hours.  BNP: BNP (last 3 results) Recent Labs    07/13/19 0200 07/14/19 0500 07/16/19 0629  BNP 608.0* 608.8* 675.9*    ProBNP (last 3 results) No results for input(s): PROBNP in the last 8760 hours.  Other results:  Imaging: DG CHEST PORT 1 VIEW  Result Date: 08/31/2019 CLINICAL DATA:  Acute respiratory distress EXAM: PORTABLE CHEST 1 VIEW COMPARISON:  08/28/2019 FINDINGS: Lung volumes are small and pulmonary insufflation has diminished since prior examination. Large right and at least small left pleural effusions are present, enlarged since prior examination. There is progressive largely perihilar interstitial infiltrate noted within the aerated lungs bilaterally, most in keeping with progressive pulmonary edema, possibly noncardiogenic in nature, though infectious or inflammatory infiltrate could appear similarly. No pneumothorax. Right internal jugular chest port is in place with its tip within the right atrium. Cardiac size within normal limits. No acute bone abnormality. IMPRESSION: 1. Interval increase in size of bilateral pleural effusions, large right and at least small left. 2. Progressive bilateral perihilar interstitial infiltrates, most in keeping  with progressive pulmonary edema, though infectious or inflammatory infiltrate could appear similarly. 3. Diminished lung volumes. Electronically Signed   By: Fidela Salisbury MD   On: 08/31/2019 21:06     Medications:     Scheduled Medications:  (feeding supplement) PROSource Plus  30 mL Oral BID BM   sodium chloride   Intravenous Once   amiodarone  200 mg Oral Daily   budesonide  0.5 mg Nebulization BID   Chlorhexidine Gluconate Cloth  6 each Topical Daily   clopidogrel  75 mg Oral Daily   digoxin  125 mcg Oral Daily   enoxaparin (LOVENOX) injection  90 mg Subcutaneous Q12H   feeding supplement (ENSURE ENLIVE)  237 mL Oral BID BM   furosemide  80 mg Intravenous BID   guaiFENesin  600 mg Oral BID   ipratropium-albuterol  3 mL Nebulization BID   lamoTRIgine  100 mg Oral BID   levETIRAcetam  750 mg Oral BID   midodrine  10 mg Oral TID WC   pantoprazole  40 mg Oral Daily   sertraline  25 mg Oral Daily   spironolactone  12.5 mg Oral Daily    Infusions:   PRN Medications: acetaminophen **OR** acetaminophen, loperamide, morphine injection, oxyCODONE, oxymetazoline, polyethylene glycol, prochlorperazine   Assessment/Plan:   1. Acute on Chronic Systolic Heart Failure - 2/2 ICM, recent MI 3/21 w/ high grade LAD, LCx and RCA lesions, all treated w/ PCI +DES - Echo 6/21 EF 35-40%. RV normal. Severe MR  - multiple recent admissions for a/c CHF c/b low output, required milrinone + levophed last admit 6-7/21 (d/c wt 172 lb) - now w/ recurrent a/c CHF + volume overload. Wt now up to 200 lb and diuresis has been limited by hypotension w/ SBPs in low 90s. SCr ok 0.98.  - doubt low output HF. Suspect low BP is secondary to intravascular volume depletion w/ hypoalbuminemia contributing (albumin 2.0)  - starting to diurese well but still a way to go.  - Continue unna boots. Increase lasix to 80IV bid. I will give one dose of metolazone today.  - Midodrine increased for BP  support - K 3.2. Supp K aggressively. Continue spiro  2. Acute RUE DVT - UE Venous doppler 8/11 + for DVT - Chest CT negative for PE - in the setting of malignancy/ hypercoagulable state - now on Lovenox, management per primary team. They will d/w Dr. Alen Blew  - hgb 6.4 -> 7.8 -> 7.4 - d/w TRH will transfuse   3. Persistent Atrial Flutter - failed DC-CVseveral times recently - currently rate controlled in the 60-70s  - may consider repeat attempt in the future, once he is back on a/c  5. Severe MR - felt to be ischemic MR - May be possible MitraClip candidate depending on lymphoma course  6. High Grade Lymphoma - diagnosed 6/21.  - CT w/ extensive retroperitoneal, pelvic, and inguinal lymphadenopathy. There is also involvement of the spleen and near complete infiltration of the right kidney. 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 Suspicious for infiltrating disease of the right iliac and left pubic bones.  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 Alen Blew. Most recent f/u 8/2. Notation regarding prognosis, "Prognosis: Aggressive therapy remains warranted at this time. His disease remains curable potentially if he achieves a complete response upon completing chemotherapy".   7. Neutropenic fever - on chemotherapy for lymphoma - WBC 1.2 on admit, mTemp of 101 at home. Lactic acid 2.2 on admit - on empiric abx w/ vanc + cefepime  - CXR - for PNA. UA -  - Urine and Blood Cultures NGTD - fevers resolved. WBC 6.2 lactic acid normalized  - abx duration per primary team/oncology   8. CAD:  - s/p pRCA PCI in 2006 - s/p recent MI with multiple stents in 3/21 (RCA x 2, LCX x 3, LAD x 1) - did not tolerate statin w/ elevated LFTs - not on ? blocker w/ recent low output HF / hypotension  - had not been on ASA given Plavix + Xarelto use (discontinued 7/21)  9. Bleeding -  Xarelto was discontinued 7/21 due to hemoptysis and epistaxis - required transfusion x 2 this admit for hgb of 6.4,. Now 7.8  -> 7.4. Agree with transfusion - now w/ RUE DVT and will need long term a/c. Consider switch to Eliquis with lower risk of bleeding  - also requiring Plavix for CAD w/ recent PCI to RCA, LAD and LCx 3/21  - Can stop Plavix if DOAC restarted. D/w Dr. Lyla Glassing of Stay: 4   Glori Bickers MD 09/02/2019, 10:26 AM  Advanced Heart Failure Team Pager [phone] (M-F; [place])  Please contact [facility] for night-coverage after hours (4p -7a ) and weekends on [email or web address]

## 2019-09-02 NOTE — Plan of Care (Signed)

## 2019-09-02 NOTE — Plan of Care (Signed)

## 2019-09-03 ENCOUNTER — Inpatient Hospital Stay (HOSPITAL_COMMUNITY): Payer: No Typology Code available for payment source

## 2019-09-03 ENCOUNTER — Other Ambulatory Visit: Payer: Self-pay | Admitting: Pulmonary Disease

## 2019-09-03 ENCOUNTER — Encounter (HOSPITAL_COMMUNITY)
Admission: EM | Disposition: A | Discharge status: Home / Self Care | Payer: Self-pay | Source: Home / Self Care | Attending: Internal Medicine

## 2019-09-03 DIAGNOSIS — J9601 Acute respiratory failure with hypoxia: Secondary | ICD-10-CM | POA: Diagnosis not present

## 2019-09-03 DIAGNOSIS — J90 Pleural effusion, not elsewhere classified: Secondary | ICD-10-CM

## 2019-09-03 DIAGNOSIS — R0603 Acute respiratory distress: Secondary | ICD-10-CM

## 2019-09-03 DIAGNOSIS — C8335 Diffuse large B-cell lymphoma, lymph nodes of inguinal region and lower limb: Secondary | ICD-10-CM | POA: Diagnosis not present

## 2019-09-03 HISTORY — PX: THORACENTESIS: SHX235

## 2019-09-03 LAB — CBC
HCT: 30 % — ABNORMAL LOW (ref 39.0–52.0)
Hemoglobin: 9.1 g/dL — ABNORMAL LOW (ref 13.0–17.0)
MCH: 28.3 pg (ref 26.0–34.0)
MCHC: 30.3 g/dL (ref 30.0–36.0)
MCV: 93.2 fL (ref 80.0–100.0)
Platelets: 84 10*3/uL — ABNORMAL LOW (ref 150–400)
RBC: 3.22 MIL/uL — ABNORMAL LOW (ref 4.22–5.81)
RDW: 17.8 % — ABNORMAL HIGH (ref 11.5–15.5)
WBC: 6.2 10*3/uL (ref 4.0–10.5)
nRBC: 0 % (ref 0.0–0.2)

## 2019-09-03 LAB — BODY FLUID CELL COUNT WITH DIFFERENTIAL
Eos, Fluid: 0 %
Lymphs, Fluid: 18 %
Monocyte-Macrophage-Serous Fluid: 42 % — ABNORMAL LOW (ref 50–90)
Neutrophil Count, Fluid: 40 % — ABNORMAL HIGH (ref 0–25)
Total Nucleated Cell Count, Fluid: 29 cu mm (ref 0–1000)

## 2019-09-03 LAB — TYPE AND SCREEN
ABO/RH(D): O POS
Antibody Screen: NEGATIVE
Unit division: 0
Unit division: 0

## 2019-09-03 LAB — BASIC METABOLIC PANEL
Anion gap: 9 (ref 5–15)
BUN: 19 mg/dL (ref 8–23)
CO2: 32 mmol/L (ref 22–32)
Calcium: 7.5 mg/dL — ABNORMAL LOW (ref 8.9–10.3)
Chloride: 96 mmol/L — ABNORMAL LOW (ref 98–111)
Creatinine, Ser: 1.16 mg/dL (ref 0.61–1.24)
GFR calc Af Amer: >60 mL/min (ref >60)
GFR calc non Af Amer: >60 mL/min (ref >60)
Glucose, Bld: 86 mg/dL (ref 70–99)
Potassium: 3.1 mmol/L — ABNORMAL LOW (ref 3.5–5.1)
Sodium: 137 mmol/L (ref 135–145)

## 2019-09-03 LAB — BPAM RBC
Blood Product Expiration Date: 202109102359
Blood Product Expiration Date: 202109102359
ISSUE DATE / TIME: 202108121024
ISSUE DATE / TIME: 202108151148
Unit Type and Rh: 5100
Unit Type and Rh: 5100

## 2019-09-03 LAB — COMPREHENSIVE METABOLIC PANEL
ALT: 16 U/L (ref 0–44)
AST: 27 U/L (ref 15–41)
Albumin: 1.9 g/dL — ABNORMAL LOW (ref 3.5–5.0)
Alkaline Phosphatase: 90 U/L (ref 38–126)
Anion gap: 10 (ref 5–15)
BUN: 22 mg/dL (ref 8–23)
CO2: 35 mmol/L — ABNORMAL HIGH (ref 22–32)
Calcium: 7.4 mg/dL — ABNORMAL LOW (ref 8.9–10.3)
Chloride: 92 mmol/L — ABNORMAL LOW (ref 98–111)
Creatinine, Ser: 1.27 mg/dL — ABNORMAL HIGH (ref 0.61–1.24)
GFR calc Af Amer: >60 mL/min (ref >60)
GFR calc non Af Amer: >60 mL/min (ref >60)
Glucose, Bld: 85 mg/dL (ref 70–99)
Potassium: 4 mmol/L (ref 3.5–5.1)
Sodium: 137 mmol/L (ref 135–145)
Total Bilirubin: 1.6 mg/dL — ABNORMAL HIGH (ref 0.3–1.2)
Total Protein: 4.1 g/dL — ABNORMAL LOW (ref 6.5–8.1)

## 2019-09-03 LAB — PROTEIN, PLEURAL OR PERITONEAL FLUID: Total protein, fluid: <3 g/dL

## 2019-09-03 LAB — LACTATE DEHYDROGENASE, PLEURAL OR PERITONEAL FLUID: LD, Fluid: 52 U/L — ABNORMAL HIGH (ref 3–23)

## 2019-09-03 LAB — GLUCOSE, PLEURAL OR PERITONEAL FLUID: Glucose, Fluid: 88 mg/dL

## 2019-09-03 LAB — CULTURE, BLOOD (ROUTINE X 2)
Culture: NO GROWTH
Culture: NO GROWTH
Special Requests: ADEQUATE
Special Requests: ADEQUATE

## 2019-09-03 LAB — LACTATE DEHYDROGENASE: LDH: 400 U/L — ABNORMAL HIGH (ref 98–192)

## 2019-09-03 LAB — MAGNESIUM: Magnesium: 1.8 mg/dL (ref 1.7–2.4)

## 2019-09-03 SURGERY — THORACENTESIS
Anesthesia: LOCAL

## 2019-09-03 MED ORDER — SPIRONOLACTONE 12.5 MG HALF TABLET
12.5000 mg | ORAL_TABLET | Freq: Two times a day (BID) | ORAL | Status: DC
  Administered 2019-09-03 – 2019-09-09 (×13): 12.5 mg via ORAL
  Filled 2019-09-03 (×13): qty 1

## 2019-09-03 MED ORDER — POTASSIUM CHLORIDE CRYS ER 20 MEQ PO TBCR
40.0000 meq | EXTENDED_RELEASE_TABLET | Freq: Three times a day (TID) | ORAL | Status: DC
  Administered 2019-09-03 – 2019-09-04 (×3): 40 meq via ORAL
  Filled 2019-09-03 (×4): qty 2

## 2019-09-03 NOTE — Plan of Care (Signed)
  Problem: Education: Goal: Knowledge of General Education information will improve Description: Including pain rating scale, medication(s)/side effects and non-pharmacologic comfort measures Outcome: Progressing   Problem: Clinical Measurements: Goal: Ability to maintain clinical measurements within normal limits will improve Outcome: Progressing Goal: Will remain free from infection Outcome: Progressing Goal: Diagnostic test results will improve Outcome: Progressing Goal: Respiratory complications will improve Outcome: Progressing Goal: Cardiovascular complication will be avoided Outcome: Progressing   Problem: Activity: Goal: Risk for activity intolerance will decrease Outcome: Progressing   Problem: Nutrition: Goal: Adequate nutrition will be maintained Outcome: Progressing   Problem: Coping: Goal: Level of anxiety will decrease Outcome: Progressing   Problem: Elimination: Goal: Will not experience complications related to bowel motility Outcome: Progressing Goal: Will not experience complications related to urinary retention Outcome: Progressing   Problem: Pain Managment: Goal: General experience of comfort will improve Outcome: Progressing   Problem: Safety: Goal: Ability to remain free from injury will improve Outcome: Progressing   Problem: Skin Integrity: Goal: Risk for impaired skin integrity will decrease Outcome: Progressing   Problem: Education: Goal: Expressions of having a comfortable level of knowledge regarding the disease process will increase Outcome: Progressing   Problem: Coping: Goal: Ability to adjust to condition or change in health will improve Outcome: Progressing Goal: Ability to identify appropriate support needs will improve Outcome: Progressing   Problem: Health Behavior/Discharge Planning: Goal: Compliance with prescribed medication regimen will improve Outcome: Progressing   Problem: Medication: Goal: Risk for medication  side effects will decrease Outcome: Progressing   Problem: Clinical Measurements: Goal: Complications related to the disease process, condition or treatment will be avoided or minimized Outcome: Progressing Goal: Diagnostic test results will improve Outcome: Progressing   Problem: Safety: Goal: Verbalization of understanding the information provided will improve Outcome: Progressing   Problem: Self-Concept: Goal: Level of anxiety will decrease Outcome: Progressing Goal: Ability to verbalize feelings about condition will improve Outcome: Progressing   Problem: Self-Concept: Goal: Level of anxiety will decrease Outcome: Progressing Goal: Ability to verbalize feelings about condition will improve Outcome: Progressing

## 2019-09-03 NOTE — Progress Notes (Signed)
Placed patient on BIPAP per pt. Request. Patient states it helps with his breathing.No distress noted at this time. RT will continue to monitor.

## 2019-09-03 NOTE — Progress Notes (Signed)
Progress Note    [name]  KKX:[id number] DOB: 1955/02/14  DOA: 08/28/2019 PCP: Tony Bliss, [name], MD    Brief Narrative:     Medical records reviewed and are as summarized below:  Tony Saunders is an 64 y.o. male with history of severe mitral regurgitation, seizure, paroxysmal atrial fibrillation, myocardial infarction, diffuse large B-cell lymphoma, CHF, CAD presents to the ER with a chief complaint of right hand pain and swelling.   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 BIPAP PRN, increasing O2 requirment 2. S/p Broad-spectrum antibiotics for any possible pneumonia 3. Last echo 6/21: EF 35-40%.  During prior hospitalization required milrinone-- cardiology consult appreciated: I Vlasix and midodrine 4. S/p 2 units PRBC 5. Pulmonary toilet 6. ? Pleural effusion and will ask PCCM to see if they need drained via thoracentesis  Neutropenic fever with high grade lymphoma 1. White blood cell count 1.2 upon admission but trending up 2. T-max at home 101.7- no further temperature 3. Oncology consulted and recommended cefepime and vancomycin- if cultures negative in 24 hours, can stop abx-- will place stop date of 8/13 4. No source of infection identified at this time 5. Covid negative,blood cultures neg,urine culture neg,continue monitor  Right upper extremity DVT- port a cath placed 7/26 1. Duplex shows acute clot 2. lovenox for now-  was previously on xarelto but this was stopped in in June due to anemia and epistaxis 3. It appears both eliquis and xarelto are cost prohibited and we need to give  consideration to coumadin vs lovenox shots  Protein calorie malnutrition 1. Feeding supplement  Hypomagnesemia/hypokalemia 1. repleted  Anemia of CD  1.  S/p 2 unit prbc  2.  R/o blood loss with hemoccult   Persistent atrial flutter 1. rate controlled 2. per cards, has failed cardioversion in past  Severe MR 1. per cards    Family Communication/Anticipated D/C date and plan/Code Status   DVT prophylaxis: Lovenox full dose Code Status: Full Code.  Family Communication: wife at bedside 8/12; hallway 8/14 Disposition Plan: Status is: Inpatient  Remains inpatient appropriate because:Inpatient level of care appropriate due to severity of illness   Dispo: The patient is from: Home              Anticipated d/c is to: Home              Anticipated d/c date is: 3 days              Patient currently is not medically stable to d/c.         Medical Consultants:    Oncology  cardiology     Subjective:   Was able to sit on side of bed with PT  Objective:    Vitals:   09/03/19 0400 09/03/19 0700 09/03/19 0846 09/03/19 1345  BP: 100/63 92/63  (!) 110/57  Pulse: 71 60 68   Resp: (!) 24 19 (!) 28 16  Temp: (!) 97.5 F (36.4 C) 97.6 F (36.4 C)    TempSrc: Oral Axillary    SpO2: 95% 100% 100% 100%  Weight:  Intake/Output Summary (Last 24 hours) at 09/03/2019 1401 Last data filed at 09/03/2019 1300 Gross per 24 hour  Intake 669 ml  Output 2425 ml  Net -1756 ml   Filed Weights   09/01/19 0500 09/02/19 0454 09/03/19 0300  Weight: 89.3 kg 90.4 kg 87 kg    Exam:   General: Appearance:     Overweight male in bed, ill appearing     Lungs:     On bipap, coarse breath sounds  Heart:    Normal heart rate  MS:   All extremities are intact.   Neurologic:   Awake, alert, oriented x 3. No apparent focal neurological           defect.      Data Reviewed:   I have personally reviewed following labs and imaging studies:  Labs: Labs show the  following:   Basic Metabolic Panel: Recent Labs  Lab 08/30/19 0526 08/30/19 0526 08/31/19 0550 08/31/19 0550 09/01/19 1148 09/01/19 1148 09/02/19 0418 09/03/19 0415  NA 134*  --  134*  --  136  --  138 137  K 3.7   < > 3.5   < > 3.0*   < > 3.2* 3.1*  CL 96*  --  97*  --  96*  --  97* 96*  CO2 29  --  26  --  29  --  31 32  GLUCOSE 84  --  128*  --  82  --  82 86  BUN 17  --  22  --  22  --  20 19  CREATININE 0.84  --  0.98  --  1.15  --  1.19 1.16  CALCIUM 7.8*  --  7.8*  --  7.5*  --  7.5* 7.5*  MG 1.6*  --   --   --   --   --  1.9 1.8   < > = values in this interval not displayed.   GFR Estimated Creatinine Clearance: 72.5 mL/min (by C-G formula based on SCr of 1.16 mg/dL). Liver Function Tests: Recent Labs  Lab 08/28/19 2312 08/30/19 0526  AST 14* 12*  ALT 13 13  ALKPHOS 113 77  BILITOT 0.8 0.8  PROT 5.1* 4.2*  ALBUMIN 2.6* 2.0*   No results for input(s): LIPASE, AMYLASE in the last 168 hours. No results for input(s): AMMONIA in the last 168 hours. Coagulation profile No results for input(s): INR, PROTIME in the last 168 hours.  CBC: Recent Labs  Lab 08/28/19 2312 08/28/19 2312 08/30/19 0526 08/31/19 0550 09/01/19 1148 09/02/19 0418 09/03/19 0415  WBC 1.2*   < > 2.0* 5.9 5.1 6.2 6.2  NEUTROABS 0.9*  --  1.6* 5.8  --   --   --   HGB 7.5*   < > 6.4* 7.8* 7.7* 7.4* 9.1*  HCT 25.6*   < > 21.2* 26.6* 25.1* 25.0* 30.0*  MCV 97.0   < > 95.5 95.0 93.0 94.3 93.2  PLT 62*   < > 48* 83* 71* 82* 84*   < > = values in this interval not displayed.   Cardiac Enzymes: No results for input(s): CKTOTAL, CKMB, CKMBINDEX, TROPONINI in the last 168 hours. BNP (last 3 results) No results for input(s): PROBNP in the last 8760 hours. CBG: No results for input(s): GLUCAP in the last 168 hours. D-Dimer: No results for input(s): DDIMER in the last 72 hours. Hgb A1c: No results for input(s): HGBA1C in the last 72 hours. Lipid Profile: No results  for input(s): CHOL, HDL,  LDLCALC, TRIG, CHOLHDL, LDLDIRECT in the last 72 hours. Thyroid function studies: No results for input(s): TSH, T4TOTAL, T3FREE, THYROIDAB in the last 72 hours.  Invalid input(s): FREET3 Anemia work up: No results for input(s): VITAMINB12, FOLATE, FERRITIN, TIBC, IRON, RETICCTPCT in the last 72 hours. Sepsis Labs: Recent Labs  Lab 08/28/19 2312 08/29/19 0219 08/30/19 0526 08/31/19 0550 09/01/19 1148 09/02/19 0418 09/03/19 0415  WBC 1.2*  --    < > 5.9 5.1 6.2 6.2  LATICACIDVEN 2.1* 1.1  --   --   --   --   --    < > = values in this interval not displayed.    Microbiology Recent Results (from the past 240 hour(s))  Blood culture (routine x 2)     Status: None   Collection Time: 08/28/19 11:00 PM   Specimen: BLOOD LEFT ARM  Result Value Ref Range Status   Specimen Description BLOOD LEFT ARM  Final   Special Requests   Final    BOTTLES DRAWN AEROBIC AND ANAEROBIC Blood Culture adequate volume   Culture   Final    NO GROWTH 5 DAYS Performed at [facility], [address], [place], [place] [address]    Report Status 09/03/2019 FINAL  Final  Blood culture (routine x 2)     Status: None   Collection Time: 08/28/19 11:13 PM   Specimen: BLOOD LEFT HAND  Result Value Ref Range Status   Specimen Description BLOOD LEFT HAND  Final   Special Requests   Final    BOTTLES DRAWN AEROBIC ONLY Blood Culture adequate volume   Culture   Final    NO GROWTH 5 DAYS Performed at [facility], [place] [address], [place], [place] [address]    Report Status 09/03/2019 FINAL  Final  SARS Coronavirus 2 by RT PCR (hospital order, performed in [facility] hospital lab) Nasopharyngeal Nasopharyngeal Swab     Status: None   Collection Time: 08/29/19  1:33 AM   Specimen: Nasopharyngeal Swab  Result Value Ref Range Status   SARS Coronavirus 2 NEGATIVE NEGATIVE Final    Comment: (NOTE) SARS-CoV-2 target nucleic acids are NOT DETECTED.  The SARS-CoV-2 RNA is generally detectable in upper  and lower respiratory specimens during the acute phase of infection. The lowest concentration of SARS-CoV-2 viral copies this assay can detect is 250 copies / mL. A negative result does not preclude SARS-CoV-2 infection and should not be used as the sole basis for treatment or other patient management decisions.  A negative result may occur with improper specimen collection / handling, submission of specimen other than nasopharyngeal swab, presence of viral mutation(s) within the areas targeted by this assay, and inadequate number of viral copies (<250 copies / mL). A negative result must be combined with clinical observations, patient history, and epidemiological information.  Fact Sheet for Patients:   StrictlyIdeas.no  Fact Sheet for Healthcare Providers: BankingDealers.co.za  This test is not yet approved or  cleared by the [place] FDA and has been authorized for detection and/or diagnosis of SARS-CoV-2 by FDA under an Emergency Use Authorization (EUA).  This EUA will remain in effect (meaning this test can be used) for the duration of the COVID-19 declaration under Section 564(b)(1) of the Act, 21 U.S.C. section 360bbb-3(b)(1), unless the authorization is terminated or revoked sooner.  Performed at [facility], [place] [address] [place], [place] [address]   Urine culture     Status:  None   Collection Time: 08/29/19  1:36 AM   Specimen: Urine, Random  Result Value Ref Range Status   Specimen Description URINE, RANDOM  Final   Special Requests NONE  Final   Culture   Final    NO GROWTH Performed at [facility], [address] [place], [place] [address]    Report Status 08/29/2019 FINAL  Final    Procedures and diagnostic studies:  DG CHEST PORT 1 VIEW  Result Date: 09/03/2019 CLINICAL DATA:  Pneumonia EXAM: PORTABLE CHEST 1 VIEW COMPARISON:  08/31/2019 FINDINGS: Cardiomegaly with moderate interstitial  edema. Moderate right and small left pleural effusions. No pneumothorax. Additional chronic scarring in the bilateral upper lobes. Right chest power port terminates cavoatrial junction. IMPRESSION: Cardiomegaly with moderate interstitial edema. Moderate right and small left pleural effusions. Electronically Signed   By: Julian Hy M.D.   On: 09/03/2019 09:51    Medications:   . (feeding supplement) PROSource Plus  30 mL Oral BID BM  . sodium chloride   Intravenous Once  . sodium chloride   Intravenous Once  . amiodarone  200 mg Oral Daily  . budesonide  0.5 mg Nebulization BID  . Chlorhexidine Gluconate Cloth  6 each Topical Daily  . clopidogrel  75 mg Oral Daily  . digoxin  125 mcg Oral Daily  . enoxaparin (LOVENOX) injection  90 mg Subcutaneous Q12H  . feeding supplement (ENSURE ENLIVE)  237 mL Oral BID BM  . furosemide  80 mg Intravenous BID  . guaiFENesin  600 mg Oral BID  . ipratropium-albuterol  3 mL Nebulization BID  . lamoTRIgine  100 mg Oral BID  . levETIRAcetam  750 mg Oral BID  . midodrine  10 mg Oral TID WC  . pantoprazole  40 mg Oral Daily  . potassium chloride  40 mEq Oral TID  . sertraline  25 mg Oral Daily  . spironolactone  12.5 mg Oral BID   Continuous Infusions: . magnesium sulfate bolus IVPB       LOS: 5 days   Geradine Girt  [facility]   How to contact the [facility] Attending or Consulting provider [place] or covering provider during after hours [place], for this patient?  1. Check the care team in [facility] and look for a) attending/consulting [facility] provider listed and b) the [facility] team listed 2. Log into [email or web address] and use [place]'s universal password to access. If you do not have the password, please contact the hospital operator. 3. Locate the [facility] provider you are looking for under [facility] and page to a number that you can be directly reached. 4. If you still have difficulty reaching the provider, please page the [facility] (Director on Call)  for the Hospitalists listed on amion for assistance.  09/03/2019, 2:01 PM

## 2019-09-03 NOTE — Op Note (Signed)
Thoracentesis  Procedure Note  Tony Saunders  [id number]  [birth year]  Date:09/03/19  Time:2:32 PM   Provider Performing:Nephtali Docken L Cayenne Breault   Procedure: Thoracentesis with imaging guidance ([id number])  Indication(s) Pleural Effusion  Consent Risks of the procedure as well as the alternatives and risks of each were explained to the patient and/or caregiver.  Consent for the procedure was obtained and is signed in the bedside chart  Anesthesia Topical only with 1% lidocaine   Time Out Verified patient identification, verified procedure, site/side was marked, verified correct patient position, special equipment/implants available, medications/allergies/relevant history reviewed, required imaging and test results available.  Sterile Technique Maximal sterile technique including full sterile barrier drape, hand hygiene, sterile gown, sterile gloves, mask, hair covering, sterile ultrasound probe cover (if used).  Procedure Description Ultrasound was used to identify appropriate pleural anatomy for placement and overlying skin marked.  Area of drainage cleaned and draped in sterile fashion. Lidocaine was used to anesthetize the skin and subcutaneous tissue.  2800 cc's of clear yellow appearing fluid was drained from the right pleural space. Catheter then removed and bandaid applied to site.  Complications/Tolerance None; patient tolerated the procedure well. Chest X-ray is ordered to confirm no post-procedural complication.  EBL Minimal  Specimen(s) Pleural fluid   Garner Nash, DO [facility] 09/03/2019 2:33 PM
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
## 2019-09-03 NOTE — Interval H&P Note (Signed)
History and Physical Interval Note:  09/03/2019 1:54 PM  Tony Saunders  has presented today for surgery, with the diagnosis of pleural effusion.  The various methods of treatment have been discussed with the patient and family. After consideration of risks, benefits and other options for treatment, the patient has consented to  Procedure(s): THORACENTESIS (N/A) as a surgical intervention.  The patient's history has been reviewed, patient examined, no change in status, stable for surgery.  I have reviewed the patient's chart and labs.  Questions were answered to the patient's satisfaction.    I discussed r/b/a to including bleeding and pneumothorax. The patient is on plavix but in worsening respiratory failure. I explained the increased risks of bleeding to the patient and patients wife. They are agreeable to proceed to see if this will improve his SOB and hypoxemia.   [place]
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
## 2019-09-03 NOTE — Progress Notes (Signed)
PCCM:  I called and spoke with the patient's wife regarding thoracentesis. I discussed with the r/b/a with her as well and she is agreeable.   Garner Nash, DO [place] [facility] 09/03/2019 1:21 PM

## 2019-09-03 NOTE — Progress Notes (Addendum)
Advanced Heart Failure Rounding Note   Subjective:    Better diuresis yesterday w/ metolazone, -2.7 L out.  Wt down additional 8 lb but still ~20 lb above most recent discharge wt.   SBPs still soft 90s-low 100s.   Scr stable w/ diuresis, 1.16.  K low 3.1  Transfused 1 unit RBCs yesterday, hgb improved 7.4>>9.1   Awake in bed. "feels about the same". Continues w/ supp O2 requirements. RUE pain improving.   Objective:   Weight Range:  Vital Signs:   Temp:  [97.1 F (36.2 C)-98.5 F (36.9 C)] 97.6 F (36.4 C) (08/16 0700) Pulse Rate:  [60-134] 60 (08/16 0700) Resp:  [17-37] 19 (08/16 0700) BP: (81-104)/(47-73) 92/63 (08/16 0700) SpO2:  [90 %-100 %] 100 % (08/16 0700) Weight:  [87 kg] 87 kg (08/16 0300) Last BM Date: 09/02/19  Weight change: Filed Weights   09/01/19 0500 09/02/19 0454 09/03/19 0300  Weight: 89.3 kg 90.4 kg 87 kg    Intake/Output:   Intake/Output Summary (Last 24 hours) at 09/03/2019 0750 Last data filed at 09/03/2019 0400 Gross per 24 hour  Intake 1424 ml  Output 2675 ml  Net -1251 ml     Physical Exam:  General:  Frail. Weak appearing. No respiratory difficulty HEENT: normal Neck: supple. Distended neck veins, JVD ~12 cm. Carotids 2+ bilat; no bruits. No lymphadenopathy or thyromegaly appreciated. Cor: PMI nondisplaced. Irregularly irregular rhythm, regular rate. + 2/6 MR murmur at apex. Lungs: decreased BS RLL, no wheezing  Abdomen: soft, nontender, nondistended. No hepatosplenomegaly. No bruits or masses. Good bowel sounds. Extremities: no cyanosis, clubbing, rash, 2+ bilateral LE edema up to thighs, + bilateral unna boots Neuro: alert & oriented x 3, cranial nerves grossly intact. moves all 4 extremities w/o difficulty. Affect pleasant.   Telemetry: Afib 60s Personally reviewed    Labs: Basic Metabolic Panel: Recent Labs  Lab 08/30/19 0526 08/30/19 0526 08/31/19 0550 08/31/19 0550 09/01/19 1148 09/02/19 0418 09/03/19 0415   NA 134*  --  134*  --  136 138 137  K 3.7  --  3.5  --  3.0* 3.2* 3.1*  CL 96*  --  97*  --  96* 97* 96*  CO2 29  --  26  --  29 31 32  GLUCOSE 84  --  128*  --  82 82 86  BUN 17  --  22  --  22 20 19   CREATININE 0.84  --  0.98  --  1.15 1.19 1.16  CALCIUM 7.8*   < > 7.8*   < > 7.5* 7.5* 7.5*  MG 1.6*  --   --   --   --  1.9 1.8   < > = values in this interval not displayed.    Liver Function Tests: Recent Labs  Lab 08/28/19 2312 08/30/19 0526  AST 14* 12*  ALT 13 13  ALKPHOS 113 77  BILITOT 0.8 0.8  PROT 5.1* 4.2*  ALBUMIN 2.6* 2.0*   No results for input(s): LIPASE, AMYLASE in the last 168 hours. No results for input(s): AMMONIA in the last 168 hours.  CBC: Recent Labs  Lab 08/28/19 2312 08/28/19 2312 08/30/19 0526 08/31/19 0550 09/01/19 1148 09/02/19 0418 09/03/19 0415  WBC 1.2*   < > 2.0* 5.9 5.1 6.2 6.2  NEUTROABS 0.9*  --  1.6* 5.8  --   --   --   HGB 7.5*   < > 6.4* 7.8* 7.7* 7.4* 9.1*  HCT 25.6*   < >  21.2* 26.6* 25.1* 25.0* 30.0*  MCV 97.0   < > 95.5 95.0 93.0 94.3 93.2  PLT 62*   < > 48* 83* 71* 82* 84*   < > = values in this interval not displayed.    Cardiac Enzymes: No results for input(s): CKTOTAL, CKMB, CKMBINDEX, TROPONINI in the last 168 hours.  BNP: BNP (last 3 results) Recent Labs    07/13/19 0200 07/14/19 0500 07/16/19 0629  BNP 608.0* 608.8* 675.9*    ProBNP (last 3 results) No results for input(s): PROBNP in the last 8760 hours.    Other results:  Imaging: No results found.   Medications:     Scheduled Medications: . (feeding supplement) PROSource Plus  30 mL Oral BID BM  . sodium chloride   Intravenous Once  . sodium chloride   Intravenous Once  . amiodarone  200 mg Oral Daily  . budesonide  0.5 mg Nebulization BID  . Chlorhexidine Gluconate Cloth  6 each Topical Daily  . clopidogrel  75 mg Oral Daily  . digoxin  125 mcg Oral Daily  . enoxaparin (LOVENOX) injection  90 mg Subcutaneous Q12H  . feeding  supplement (ENSURE ENLIVE)  237 mL Oral BID BM  . furosemide  80 mg Intravenous BID  . guaiFENesin  600 mg Oral BID  . ipratropium-albuterol  3 mL Nebulization BID  . lamoTRIgine  100 mg Oral BID  . levETIRAcetam  750 mg Oral BID  . midodrine  10 mg Oral TID WC  . pantoprazole  40 mg Oral Daily  . potassium chloride  40 mEq Oral Once  . potassium chloride  40 mEq Oral TID  . sertraline  25 mg Oral Daily  . spironolactone  12.5 mg Oral Daily    Infusions: . magnesium sulfate bolus IVPB      PRN Medications: acetaminophen **OR** acetaminophen, loperamide, morphine injection, oxyCODONE, oxymetazoline, polyethylene glycol, prochlorperazine   Assessment/Plan:   1. Acute on Chronic Systolic Heart Failure - 2/2 ICM, recent MI 3/21 w/ high grade LAD, LCx and RCA lesions, all treated w/ PCI +DES - Echo 6/21 EF 35-40%. RV normal. Severe MR  - multiple recent admissions for a/c CHF c/b low output, required milrinone + levophed last admit 6-7/21 (d/c wt 172 lb) - now w/ recurrent a/c CHF + volume overload. Admit wt up to 200 lb and diuresis has been limited by hypotension w/ SBPs in low 90s. SCr ok  - doubt low output HF. Suspect low BP is secondary to intravascular volume depletion w/ hypoalbuminemia contributing (albumin 2.0)  - starting to diurese well but still ~20 lb volume overloaded  - Continue unna boots.  - Continue IV Lasix  80 bid. - Give another dose of metolazone this afternoon, once K adequately repleated - Increase Spiro to 12.5 mg bid - Midodrine increased for BP support - K 3.1. Supp K aggressively.  2. Acute RUE DVT - UE Venous doppler 8/11 + for DVT - Chest CT negative for PE - in the setting of malignancy/ hypercoagulable state - now on Lovenox, management per primary team. They will d/w Dr. Alen Blew  - hgb 6.4 -> 7.8 -> 7.4->9.1   3. Persistent Atrial Flutter - failed DC-CVseveral times recently - currently rate controlled in the 60-70s  - may consider repeat  attempt in the future, once he is back on a/c  5. Severe MR - felt to be ischemic MR - May be possible MitraClip candidate depending on lymphoma course  6. High Grade Lymphoma -  diagnosed 6/21.  - CT w/ extensive retroperitoneal, pelvic, and inguinal lymphadenopathy. There is also involvement of the spleen and near complete infiltration of the right kidney. 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 Suspicious for infiltrating disease of the right iliac and left pubic bones.  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 Alen Blew. Most recent f/u 8/2. Notation regarding prognosis, "Prognosis: Aggressive therapy remains warranted at this time. His disease remains curable potentially if he achieves a complete response upon completing chemotherapy".   7. Neutropenic fever - on chemotherapy for lymphoma - WBC 1.2 on admit, mTemp of 101 at home. Lactic acid 2.2 on admit - on empiric abx w/ vanc + cefepime  - CXR - for PNA. UA -  - Urine and Blood Cultures NGTD - fevers resolved. WBC 6.2 lactic acid normalized  - abx duration per primary team/oncology   8. CAD:  - s/p pRCA PCI in 2006 - s/p recent MI with multiple stents in 3/21 (RCA x 2, LCX x 3, LAD x 1) - did not tolerate statin w/ elevated LFTs - not on ? blocker w/ recent low output HF / hypotension  - had not been on ASA given Plavix + Xarelto use (discontinued 7/21)  9. Bleeding - Xarelto was discontinued 7/21 due to hemoptysis and epistaxis - required transfusion x 3 this admit for hgb of 6.4. Hgb 9.1 today.  - now w/ RUE DVT and will need long term a/c. Consider switch to Eliquis with lower risk of bleeding  - also requiring Plavix for CAD w/ recent PCI to RCA, LAD and LCx 3/21  - Can stop Plavix if DOAC restarted. D/w Dr. Eliseo Squires   Length of Stay: [place] PA-C 09/03/2019, 7:50 AM  Advanced Heart Failure  Team Pager [phone] (M-F; [place])  Please contact [facility] for night-coverage after hours (4p -7a ) and weekends on [email or web address]  Patient seen and examined with the above-signed Advanced Practice Provider and/or Housestaff. I personally reviewed laboratory data, imaging studies and relevant notes. I independently examined the patient and formulated the important aspects of the plan. I have edited the note to reflect any of my changes or salient points. I have personally discussed the plan with the patient and/or family.  Diuresis improved with addition of metolazone. Renal function stable. Underwent thoracentesis of R pleural effusion today with 2.8L out. Appears transudative.   General:  Weak appearing. No resp difficulty HEENT: normal Neck: supple.JVP to jaw . Carotids 2+ bilat; no bruits. No lymphadenopathy or thryomegaly appreciated. Cor: PMI nondisplaced. Irregular rate & rhythm. No rubs, gallops or murmurs. Lungs: clear dull at basese Abdomen: soft, nontender, nondistended. No hepatosplenomegaly. No bruits or masses. Good bowel sounds. Extremities: no cyanosis, clubbing, rash, 2+ edema Neuro: alert & orientedx3, cranial nerves grossly intact. moves all 4 extremities w/o difficulty. Affect pleasant  Improving slowly with IV diuresis and thoracentesis. Still volume overloaded. Weight up 15-20 pounds from baseline.  Will continue IV diuresis as tolerated.Supp K. Increase spiro.   Glori Bickers, MD  10:43 PM

## 2019-09-03 NOTE — Progress Notes (Signed)
  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   Plan: The patient will follow-up with radiation oncology in 19mo, or PRN. -----------------------------------  Eppie Gibson, MD

## 2019-09-04 ENCOUNTER — Encounter (HOSPITAL_COMMUNITY): Payer: Self-pay | Admitting: Pulmonary Disease

## 2019-09-04 DIAGNOSIS — R609 Edema, unspecified: Secondary | ICD-10-CM

## 2019-09-04 DIAGNOSIS — Z9889 Other specified postprocedural states: Secondary | ICD-10-CM

## 2019-09-04 DIAGNOSIS — J9601 Acute respiratory failure with hypoxia: Secondary | ICD-10-CM | POA: Diagnosis not present

## 2019-09-04 LAB — BASIC METABOLIC PANEL
Anion gap: 6 (ref 5–15)
BUN: 20 mg/dL (ref 8–23)
CO2: 40 mmol/L — ABNORMAL HIGH (ref 22–32)
Calcium: 7.4 mg/dL — ABNORMAL LOW (ref 8.9–10.3)
Chloride: 91 mmol/L — ABNORMAL LOW (ref 98–111)
Creatinine, Ser: 1.15 mg/dL (ref 0.61–1.24)
GFR calc Af Amer: >60 mL/min (ref >60)
GFR calc non Af Amer: >60 mL/min (ref >60)
Glucose, Bld: 113 mg/dL — ABNORMAL HIGH (ref 70–99)
Potassium: 3.2 mmol/L — ABNORMAL LOW (ref 3.5–5.1)
Sodium: 137 mmol/L (ref 135–145)

## 2019-09-04 LAB — CBC
HCT: 31.3 % — ABNORMAL LOW (ref 39.0–52.0)
Hemoglobin: 9.5 g/dL — ABNORMAL LOW (ref 13.0–17.0)
MCH: 28.2 pg (ref 26.0–34.0)
MCHC: 30.4 g/dL (ref 30.0–36.0)
MCV: 92.9 fL (ref 80.0–100.0)
Platelets: 76 10*3/uL — ABNORMAL LOW (ref 150–400)
RBC: 3.37 MIL/uL — ABNORMAL LOW (ref 4.22–5.81)
RDW: 17.9 % — ABNORMAL HIGH (ref 11.5–15.5)
WBC: 6.9 10*3/uL (ref 4.0–10.5)
nRBC: 0 % (ref 0.0–0.2)

## 2019-09-04 LAB — ALBUMIN, FLUID (OTHER): Albumin, Body Fluid Other: 0.8 g/dL

## 2019-09-04 LAB — CYTOLOGY - NON PAP

## 2019-09-04 LAB — OCCULT BLOOD X 1 CARD TO LAB, STOOL: Fecal Occult Bld: NEGATIVE

## 2019-09-04 MED ORDER — RISAQUAD PO CAPS
2.0000 | ORAL_CAPSULE | Freq: Every day | ORAL | Status: DC
  Administered 2019-09-04 – 2019-09-09 (×6): 2 via ORAL
  Filled 2019-09-04 (×6): qty 2

## 2019-09-04 MED ORDER — GERHARDT'S BUTT CREAM
TOPICAL_CREAM | CUTANEOUS | Status: DC | PRN
  Filled 2019-09-04: qty 1

## 2019-09-04 MED ORDER — POTASSIUM CHLORIDE CRYS ER 20 MEQ PO TBCR
40.0000 meq | EXTENDED_RELEASE_TABLET | Freq: Every day | ORAL | Status: DC
  Administered 2019-09-04 – 2019-09-05 (×2): 40 meq via ORAL
  Filled 2019-09-04: qty 2

## 2019-09-04 MED ORDER — POTASSIUM CHLORIDE CRYS ER 20 MEQ PO TBCR
40.0000 meq | EXTENDED_RELEASE_TABLET | Freq: Four times a day (QID) | ORAL | Status: AC
  Administered 2019-09-04 – 2019-09-05 (×3): 40 meq via ORAL
  Filled 2019-09-04 (×3): qty 2

## 2019-09-04 MED ORDER — ENOXAPARIN SODIUM 80 MG/0.8ML ~~LOC~~ SOLN
80.0000 mg | Freq: Two times a day (BID) | SUBCUTANEOUS | Status: DC
  Administered 2019-09-04 – 2019-09-06 (×4): 80 mg via SUBCUTANEOUS
  Filled 2019-09-04 (×4): qty 0.8

## 2019-09-04 NOTE — Progress Notes (Addendum)
Orthopedic Tech Progress Note Patient Details:  Tony Saunders February 15, 1955 [id number] Called floor to speak with RN about removing patient current UNNA BOOTS  Patient ID: Tony Saunders, male   DOB: 18-Mar-1955, 64 y.o.   MRN: [id number]   [name] 09/04/2019, 2:38 PM

## 2019-09-04 NOTE — Progress Notes (Signed)
Dressing to right arm changed with xeroform, wrapped with kerlix.

## 2019-09-04 NOTE — Progress Notes (Signed)
Blood pressure 08'L systolic Np made aware gave order to give scheduled lasix

## 2019-09-04 NOTE — Progress Notes (Signed)
Would ask to use bipap when turned to sides  to wash him up.

## 2019-09-04 NOTE — NC FL2 (Signed)
[place] LEVEL OF CARE SCREENING TOOL     IDENTIFICATION  Patient Name: Tony Saunders Birthdate: 11-16-1955 Sex: male Admission Date (Current Location): 08/28/2019  [facility] and [place] Number:  Herbalist and Address:  [facility] [place], [place] [address] [place], [place] [address]      Provider Number: [id number]  Attending Physician Name and Address:  Geradine Girt, DO  Relative Name and Phone Number:       Current Level of Care: Hospital Recommended Level of Care: [place] Prior Approval Number:    Date Approved/Denied:   PASRR Number: [id number]  Discharge Plan: SNF    Current Diagnoses: Patient Active Problem List   Diagnosis Date Noted  . Pleural effusion 09/03/2019  . DVT (deep venous thrombosis) ([place]) 08/30/2019  . Neutropenic fever ([place]) 08/29/2019  . Neutropenia with fever ([place]) 08/29/2019  . Port-A-Cath in place 07/30/2019  . Symptomatic anemia   . Persistent atrial fibrillation ([place])   . Cardiogenic shock ([place]) 07/08/2019  . Acute respiratory failure with hypoxia ([place]) 07/08/2019  . Severe sepsis ([place]) 07/08/2019  . Neutropenic sepsis ([place]) 07/08/2019  . Febrile neutropenia ([place])   . Lymphoma ([place])   . Pulmonary infiltrate   . Hypotension   . Protein-calorie malnutrition, severe 06/21/2019  . Atrial flutter with rapid ventricular response ([place])   . Pressure ulcer 06/16/2019  . Oropharyngeal candidiasis 06/16/2019  . Hypokalemia 06/16/2019  . Ileus ([place]) 06/16/2019  . Pancytopenia ([place]) 06/15/2019  . Acute on chronic systolic heart failure ([place]) 06/13/2019  . 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 Atrial fibrillation with RVR ([place]) 05/23/2019  . Left leg swelling   . PAF (persistent-paroxysmal atrial fibrillation) ([place]) 04/16/2019  . Ischemic cardiomyopathy  04/16/2019  . Severe mitral regurgitation by prior echocardiogram-likely ischemic with tethered posterior leaflet 03/31/2019  . Elevated transaminase level 03/31/2019  . 3vessel CAD- S/P PCI 03/27/2019  . Acute ST elevation myocardial infarction (STEMI) of inferior wall ([place]) 03/27/2019  . Hyperlipidemia with target LDL less than 70 03/27/2019  . 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    Orientation RESPIRATION BLADDER Height & Weight     Self, Time, Situation, Place  O2 Incontinent Weight: 176 lb 9.4 oz (80.1 kg) Height:     BEHAVIORAL SYMPTOMS/MOOD NEUROLOGICAL BOWEL NUTRITION STATUS      Incontinent Diet (See d/c summary)  AMBULATORY STATUS COMMUNICATION OF NEEDS Skin   Extensive Assist Verbally PU Stage and Appropriate Care                       Personal Care Assistance Level of Assistance  Bathing, Feeding, Dressing Bathing Assistance: Maximum assistance Feeding assistance: Limited assistance Dressing Assistance: Maximum assistance     Functional Limitations Info  Speech, Hearing, Sight          SPECIAL CARE FACTORS FREQUENCY                       Contractures Contractures Info: Not present    Additional Factors Info  Code Status, Allergies, Isolation Precautions Code Status Info: full Allergies Info: nka           Current Medications (09/04/2019):  This is the current hospital active medication list Current Facility-Administered Medications  Medication Dose Route Frequency Provider Last Rate Last Admin  . (feeding supplement) PROSource  Plus liquid 30 mL  30 mL Oral BID BM Vann, Jessica U, DO   30 mL at 09/04/19 1338  . 0.9 %  sodium chloride infusion (Manually program via Guardrails IV Fluids)   Intravenous Once Geradine Girt, DO 10 mL/hr at 08/30/19 0939 Rate Change at 08/30/19 0939  . 0.9 %  sodium chloride infusion (Manually program via Guardrails IV Fluids)   Intravenous Once Vann, Jessica U, DO      . acetaminophen (TYLENOL) tablet 650 mg   650 mg Oral Q6H PRN Zierle-Ghosh, Asia B, DO       Or  . acetaminophen (TYLENOL) suppository 650 mg  650 mg Rectal Q6H PRN Zierle-Ghosh, Asia B, DO      . acidophilus (RISAQUAD) capsule 2 capsule  2 capsule Oral Daily Vann, Jessica U, DO   2 capsule at 09/04/19 1345  . amiodarone (PACERONE) tablet 200 mg  200 mg Oral Daily Zierle-Ghosh, Asia B, DO   200 mg at 09/04/19 0943  . budesonide (PULMICORT) nebulizer solution 0.5 mg  0.5 mg Nebulization BID Zierle-Ghosh, Asia B, DO   0.5 mg at 09/04/19 0856  . Chlorhexidine Gluconate Cloth 2 % PADS 6 each  6 each Topical Daily Geradine Girt, DO   6 each at 09/04/19 0943  . clopidogrel (PLAVIX) tablet 75 mg  75 mg Oral Daily Zierle-Ghosh, Asia B, DO   75 mg at 09/04/19 0943  . digoxin (LANOXIN) tablet 125 mcg  125 mcg Oral Daily Zierle-Ghosh, Asia B, DO   125 mcg at 09/04/19 0944  . enoxaparin (LOVENOX) injection 80 mg  80 mg Subcutaneous Q12H Carney, Gay Filler, RPH      . feeding supplement (ENSURE ENLIVE) (ENSURE ENLIVE) liquid 237 mL  237 mL Oral BID BM Vann, Jessica U, DO   237 mL at 09/04/19 0944  . furosemide (LASIX) injection 80 mg  80 mg Intravenous BID Bensimhon, Shaune Pascal, MD   80 mg at 09/04/19 0950  . guaiFENesin (MUCINEX) 12 hr tablet 600 mg  600 mg Oral BID Eulogio Bear U, DO   600 mg at 09/04/19 0943  . ipratropium-albuterol (DUONEB) 0.5-2.5 (3) MG/3ML nebulizer solution 3 mL  3 mL Nebulization BID Eliseo Squires, Jessica U, DO   3 mL at 09/04/19 0856  . lamoTRIgine (LAMICTAL) tablet 100 mg  100 mg Oral BID Zierle-Ghosh, Asia B, DO   100 mg at 09/04/19 0942  . levETIRAcetam (KEPPRA) tablet 750 mg  750 mg Oral BID Vianne Bulls, MD   750 mg at 09/04/19 0942  . loperamide (IMODIUM) capsule 2 mg  2 mg Oral PRN Eulogio Bear U, DO   2 mg at 09/04/19 1337  . magnesium sulfate IVPB 2 g 50 mL  2 g Intravenous Once Bensimhon, Shaune Pascal, MD      . midodrine (PROAMATINE) tablet 10 mg  10 mg Oral TID WC Simmons, Brittainy M, PA-C   10 mg at 09/04/19 1150  .  morphine 2 MG/ML injection 2 mg  2 mg Intravenous Q2H PRN Zierle-Ghosh, Asia B, DO      . oxyCODONE (Oxy IR/ROXICODONE) immediate release tablet 5 mg  5 mg Oral Q4H PRN Zierle-Ghosh, Asia B, DO      . oxymetazoline (AFRIN) 0.05 % nasal spray 3 spray  3 spray Each Nare BID PRN Eulogio Bear U, DO   3 spray at 08/30/19 1733  . pantoprazole (PROTONIX) EC tablet 40 mg  40 mg Oral Daily Zierle-Ghosh, Asia B, DO   40  mg at 09/04/19 0944  . polyethylene glycol (MIRALAX / GLYCOLAX) packet 17 g  17 g Oral Daily PRN Zierle-Ghosh, Asia B, DO      . [START ON 09/05/2019] potassium chloride SA (KLOR-CON) CR tablet 40 mEq  40 mEq Oral Daily Vann, Jessica U, DO   40 mEq at 09/04/19 1344  . potassium chloride SA (KLOR-CON) CR tablet 40 mEq  40 mEq Oral Q6H Lyda Jester M, PA-C   40 mEq at 09/04/19 1348  . prochlorperazine (COMPAZINE) tablet 10 mg  10 mg Oral Q6H PRN Zierle-Ghosh, Asia B, DO      . sertraline (ZOLOFT) tablet 25 mg  25 mg Oral Daily Zierle-Ghosh, Asia B, DO   25 mg at 09/04/19 0944  . spironolactone (ALDACTONE) tablet 12.5 mg  12.5 mg Oral BID Lyda Jester M, PA-C   12.5 mg at 09/04/19 0258     Discharge Medications: Please see discharge summary for a list of discharge medications.  Relevant Imaging Results:  Relevant Lab Results:   Additional Information ssn: [id number]  Emeterio Reeve, [place]

## 2019-09-04 NOTE — Progress Notes (Addendum)
Advanced Heart Failure Rounding Note   Subjective:    S/p Rt thoracentesis for pleural effusion yesterday, - 2800 cc's of clear yellow appearing fluid was drained from the right pleural space. Fluid cultures pending.   Required BiPAP overnight. Now on HFNC, 9L.   -2.6L in UOP yesterday. I/Os net negative 4.8 L yesterday.   Wt down to 176 lb today. SBPs 90s.   Sleeping in bed. No complaints today.     Objective:   Weight Range:  Vital Signs:   Temp:  [97.4 F (36.3 C)-98.3 F (36.8 C)] 97.4 F (36.3 C) (08/17 0400) Pulse Rate:  [62-68] 64 (08/17 0400) Resp:  [16-33] 24 (08/17 0400) BP: (94-111)/(54-72) 104/65 (08/17 0400) SpO2:  [95 %-100 %] 100 % (08/17 0400) FiO2 (%):  [40 %] 40 % (08/16 2355) Weight:  [80.1 kg] 80.1 kg (08/17 0331) Last BM Date: 09/03/19  Weight change: Filed Weights   09/02/19 0454 09/03/19 0300 09/04/19 0331  Weight: 90.4 kg 87 kg 80.1 kg    Intake/Output:   Intake/Output Summary (Last 24 hours) at 09/04/2019 0803 Last data filed at 09/03/2019 2030 Gross per 24 hour  Intake 237 ml  Output 5300 ml  Net -5063 ml     Physical Exam:  General: weak/fatigue appearing male, No respiratory difficulty HEENT: normal Neck: supple. JVD elevated to ear. Carotids 2+ bilat; no bruits. No lymphadenopathy or thyromegaly appreciated. Cor: PMI nondisplaced. Irregularly irregular rhythm, regular rate. + 2/6 MR murmur at apex. Lungs: course BS at the bases bilaterally, otherwise clear  Abdomen: soft, nontender, nondistended. No hepatosplenomegaly. No bruits or masses. Good bowel sounds. Extremities: no cyanosis, clubbing, rash, 1+ bilateral LE edema up to thighs, + bilateral unna boots Neuro: alert & oriented x 3, cranial nerves grossly intact. moves all 4 extremities w/o difficulty. Affect pleasant.   Telemetry: Afib 60s-70s Personally reviewed    Labs: Basic Metabolic Panel: Recent Labs  Lab 08/30/19 0526 08/30/19 0526 08/31/19 0550  08/31/19 0550 09/01/19 1148 09/01/19 1148 09/02/19 0418 09/03/19 0415 09/03/19 1820  NA 134*   < > 134*  --  136  --  138 137 137  K 3.7   < > 3.5  --  3.0*  --  3.2* 3.1* 4.0  CL 96*   < > 97*  --  96*  --  97* 96* 92*  CO2 29   < > 26  --  29  --  31 32 35*  GLUCOSE 84   < > 128*  --  82  --  82 86 85  BUN 17   < > 22  --  22  --  20 19 22   CREATININE 0.84   < > 0.98  --  1.15  --  1.19 1.16 1.27*  CALCIUM 7.8*   < > 7.8*   < > 7.5*   < > 7.5* 7.5* 7.4*  MG 1.6*  --   --   --   --   --  1.9 1.8  --    < > = values in this interval not displayed.    Liver Function Tests: Recent Labs  Lab 08/28/19 2312 08/30/19 0526 09/03/19 1820  AST 14* 12* 27  ALT 13 13 16   ALKPHOS 113 77 90  BILITOT 0.8 0.8 1.6*  PROT 5.1* 4.2* 4.1*  ALBUMIN 2.6* 2.0* 1.9*   No results for input(s): LIPASE, AMYLASE in the last 168 hours. No results for input(s): AMMONIA in the last 168 hours.  CBC: Recent Labs  Lab 08/28/19 2312 08/28/19 2312 08/30/19 0526 08/31/19 0550 09/01/19 1148 09/02/19 0418 09/03/19 0415  WBC 1.2*   < > 2.0* 5.9 5.1 6.2 6.2  NEUTROABS 0.9*  --  1.6* 5.8  --   --   --   HGB 7.5*   < > 6.4* 7.8* 7.7* 7.4* 9.1*  HCT 25.6*   < > 21.2* 26.6* 25.1* 25.0* 30.0*  MCV 97.0   < > 95.5 95.0 93.0 94.3 93.2  PLT 62*   < > 48* 83* 71* 82* 84*   < > = values in this interval not displayed.    Cardiac Enzymes: No results for input(s): CKTOTAL, CKMB, CKMBINDEX, TROPONINI in the last 168 hours.  BNP: BNP (last 3 results) Recent Labs    07/13/19 0200 07/14/19 0500 07/16/19 0629  BNP 608.0* 608.8* 675.9*    ProBNP (last 3 results) No results for input(s): PROBNP in the last 8760 hours.    Other results:  Imaging: DG CHEST PORT 1 VIEW  Result Date: 09/03/2019 CLINICAL DATA:  Status post thoracentesis. EXAM: PORTABLE CHEST 1 VIEW COMPARISON:  09/03/2019 FINDINGS: Interval near complete evacuation of the right pleural fluid collection. No postprocedural pneumothorax is  identified. Persistent underlying emphysematous changes, mild interstitial edema and patchy areas of atelectasis. Small left pleural effusion persists. IMPRESSION: Status post right thoracentesis with near complete evacuation of the right pleural fluid collection. No postprocedural pneumothorax. Electronically Signed   By: Marijo Sanes M.D.   On: 09/03/2019 14:58   DG CHEST PORT 1 VIEW  Result Date: 09/03/2019 CLINICAL DATA:  Pneumonia EXAM: PORTABLE CHEST 1 VIEW COMPARISON:  08/31/2019 FINDINGS: Cardiomegaly with moderate interstitial edema. Moderate right and small left pleural effusions. No pneumothorax. Additional chronic scarring in the bilateral upper lobes. Right chest power port terminates cavoatrial junction. IMPRESSION: Cardiomegaly with moderate interstitial edema. Moderate right and small left pleural effusions. Electronically Signed   By: Julian Hy M.D.   On: 09/03/2019 09:51     Medications:     Scheduled Medications: . (feeding supplement) PROSource Plus  30 mL Oral BID BM  . sodium chloride   Intravenous Once  . sodium chloride   Intravenous Once  . amiodarone  200 mg Oral Daily  . budesonide  0.5 mg Nebulization BID  . Chlorhexidine Gluconate Cloth  6 each Topical Daily  . clopidogrel  75 mg Oral Daily  . digoxin  125 mcg Oral Daily  . enoxaparin (LOVENOX) injection  90 mg Subcutaneous Q12H  . feeding supplement (ENSURE ENLIVE)  237 mL Oral BID BM  . furosemide  80 mg Intravenous BID  . guaiFENesin  600 mg Oral BID  . ipratropium-albuterol  3 mL Nebulization BID  . lamoTRIgine  100 mg Oral BID  . levETIRAcetam  750 mg Oral BID  . midodrine  10 mg Oral TID WC  . pantoprazole  40 mg Oral Daily  . potassium chloride  40 mEq Oral TID  . sertraline  25 mg Oral Daily  . spironolactone  12.5 mg Oral BID    Infusions: . magnesium sulfate bolus IVPB      PRN Medications: acetaminophen **OR** acetaminophen, loperamide, morphine injection, oxyCODONE,  oxymetazoline, polyethylene glycol, prochlorperazine   Assessment/Plan:   1. Acute on Chronic Systolic Heart Failure - 2/2 ICM, recent MI 3/21 w/ high grade LAD, LCx and RCA lesions, all treated w/ PCI +DES - Echo 6/21 EF 35-40%. RV normal. Severe MR  - multiple recent admissions for a/c CHF  c/b low output, required milrinone + levophed last admit 6-7/21 (d/c wt 172 lb) - now w/ recurrent a/c CHF + volume overload. Admit wt up to 200 lb and diuresis has been limited by hypotension w/ SBPs in low 90s. SCr ok  - doubt low output HF. Suspect low BP is secondary to intravascular volume depletion w/ hypoalbuminemia contributing (albumin 2.0)  - starting to diurese well but still volume overloaded  - Continue unna boots.  - Continue IV Lasix  80 bid. - Continue Spiro 12.5 mg bid - Midodrine increased for BP support - Continue digoxin 0.125   2. Acute RUE DVT - UE Venous doppler 8/11 + for DVT - Chest CT negative for PE - in the setting of malignancy/ hypercoagulable state - now on Lovenox, management per primary team. They will d/w Dr. Alen Blew  - hgb 6.4 -> 7.8 -> 7.4->9.1   3. Persistent Atrial Flutter - failed DC-CVseveral times recently - currently rate controlled in the 60-70s  - may consider repeat attempt in the future, once he is back on a/c  5. Severe MR - felt to be ischemic MR - May be possible MitraClip candidate depending on lymphoma course  6. High Grade Lymphoma - diagnosed 6/21.  - CT w/ extensive retroperitoneal, pelvic, and inguinal lymphadenopathy. There is also involvement of the spleen and near complete infiltration of the right kidney. 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 Suspicious for infiltrating disease of the right iliac and left pubic bones.  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 Alen Blew. Most recent f/u 8/2. Notation regarding  prognosis, "Prognosis: Aggressive therapy remains warranted at this time. His disease remains curable potentially if he achieves a complete response upon completing chemotherapy".   7. Neutropenic fever - on chemotherapy for lymphoma - WBC 1.2 on admit, mTemp of 101 at home. Lactic acid 2.2 on admit - on empiric abx w/ vanc + cefepime  - CXR - for PNA. UA -  - Urine and Blood Cultures NGTD - fevers resolved. WBC and lactic acid normalized  - abx duration per primary team/oncology   8. CAD:  - s/p pRCA PCI in 2006 - s/p recent MI with multiple stents in 3/21 (RCA x 2, LCX x 3, LAD x 1) - did not tolerate statin w/ elevated LFTs - not on ? blocker w/ recent low output HF / hypotension  - had not been on ASA given Plavix + Xarelto use (discontinued 7/21)  9. Bleeding - Xarelto was discontinued 7/21 due to hemoptysis and epistaxis - required transfusion x 3 this admit for hgb of 6.4. Hgb 9.1 today.  - now w/ RUE DVT and will need long term a/c. Consider switch to Eliquis with lower risk of bleeding  - also requiring Plavix for CAD w/ recent PCI to RCA, LAD and LCx 3/21  - Can stop Plavix if DOAC restarted. D/w Dr. Eliseo Squires  10. Rt Pleural Effusion  - S/p Rt thoracentesis 8/16 - post procedure CXR w/ near complete evacuation of pleural fluid collection - fluid cultures pending    Length of Stay: [place] PA-C 09/04/2019, 8:03 AM  Advanced Heart Failure Team Pager [phone] (M-F; 7a - 4p)  Please contact [facility] for night-coverage after hours (4p -7a ) and weekends on [email or web address]   Patient seen and examined with the above-signed Advanced Practice Provider and/or Housestaff. I personally reviewed  laboratory data, imaging studies and relevant notes. I independently examined the patient and formulated the important aspects of the plan. I have edited the note to reflect any of my changes or salient points. I have personally discussed the plan with the patient and/or  family.  Diuresing well with IV lasix. Breathing better after thora but still requiring intermittent BIPAP.   General:  Weak appearing. No resp difficulty HEENT: normal Neck: supple. JVP to jaw  Carotids 2+ bilat; no bruits. No lymphadenopathy or thryomegaly appreciated. Cor: PMI nondisplaced. Irregular rate & rhythm. No rubs, gallops or murmurs. Lungs: clear Abdomen: soft, nontender, nondistended. No hepatosplenomegaly. No bruits or masses. Good bowel sounds. Extremities: no cyanosis, clubbing, rash, 2+ edema in thighs  Neuro: alert & orientedx3, cranial nerves grossly intact. moves all 4 extremities w/o difficulty. Affect pleasant  He is diuresing well but still some volume overload with 3rd spacing. Continue IV laisx one more day. Watch renal function. Supp K.   Glori Bickers, MD  3:32 PM

## 2019-09-04 NOTE — Progress Notes (Addendum)
[place] for enoxaparin Indication: DVT  Patient Measurements: Weight 88.5 kg (195 lb 3 oz) Height 6' (182.9 cm)  Vital Signs: Temp: 97.4 F (36.3 C) (08/17 0400) Temp Source: Oral (08/17 0400) BP: 103/52 (08/17 1100) Pulse Rate: 64 (08/17 1100)  Labs: Recent Labs    09/02/19 0418 09/02/19 0418 09/03/19 0415 09/03/19 1820 09/04/19 1122  HGB 7.4*   < > 9.1*  --  9.5*  HCT 25.0*  --  30.0*  --  31.3*  PLT 82*  --  84*  --  76*  CREATININE 1.19   < > 1.16 1.27* 1.15   < > = values in this interval not displayed.    Medications:  Scheduled:  . (feeding supplement) PROSource Plus  30 mL Oral BID BM  . sodium chloride   Intravenous Once  . sodium chloride   Intravenous Once  . acidophilus  2 capsule Oral Daily  . amiodarone  200 mg Oral Daily  . budesonide  0.5 mg Nebulization BID  . Chlorhexidine Gluconate Cloth  6 each Topical Daily  . clopidogrel  75 mg Oral Daily  . digoxin  125 mcg Oral Daily  . enoxaparin (LOVENOX) injection  90 mg Subcutaneous Q12H  . feeding supplement (ENSURE ENLIVE)  237 mL Oral BID BM  . furosemide  80 mg Intravenous BID  . guaiFENesin  600 mg Oral BID  . ipratropium-albuterol  3 mL Nebulization BID  . lamoTRIgine  100 mg Oral BID  . levETIRAcetam  750 mg Oral BID  . midodrine  10 mg Oral TID WC  . pantoprazole  40 mg Oral Daily  . [START ON 09/05/2019] potassium chloride  40 mEq Oral Daily  . potassium chloride  40 mEq Oral Q6H  . sertraline  25 mg Oral Daily  . spironolactone  12.5 mg Oral BID     Assessment: 64 yo male with a history of lymphoma presents with shortness of breath and fever. The patient was found to have neutropenia and a fever. The patient has a venous duplex that was found to be positive for acute deep vein thrombosis involving the right subclavian vein, 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 The CTA was negative for pulmonary embolism. PTA the patient is not taking any  anticoagulation.  Pharmacy is now consulted to dose therapeutic enoxaparin for DVT. There are no signs or symptoms of bleeding documented.  Platelets low but stable.  Goal of Therapy:  Anti-Xa level 0.6-1 units/ml 4hrs after LMWH dose given Monitor platelets by anticoagulation protocol: Yes   Plan:  Adjust lovenox to 80 mg SQ bid for decreasing weight. Monitor s/sx bleeding Per HF clinic pharmacy team - Eliquis and Xarelto will both be $498 for him as his insurance does not cover this.   [place] Crane, Roylene Reason, BCCP Clinical Pharmacist  09/04/2019 12:52 PM   [facility] pharmacy phone numbers are listed on [email or web address]

## 2019-09-04 NOTE — TOC Initial Note (Signed)
Transition of Care [facility]) - Initial/Assessment Note    Patient Details  Name: Tony Saunders MRN: [id number] Date of Birth: January 27, 1955  [facility] CM/SW Contact:    Emeterio Reeve, [place] Phone Number: 09/04/2019, 4:15 PM  Clinical Narrative:                  CSW met with pt at bedside. CSW introduced self and explained her role at the hospital.  Pts wife Tony Saunders was present for assessment. Tony Saunders stated that PTA he was independent. Tony Saunders stated he has gotten weaker with each admission. Tony Saunders reports pt has been living at home with her.   CSW reviewed PT reccs of SNF. Tony Saunders and pt agreed that rehab is what s best for now. Tony Saunders stated that pt has nt been to a facility in the past and did not have a preferences. Tony Saunders gave csw permission to fax out to places in the area.Pt has been faxed out.   Tony Saunders stated that pt has insurance through [employer] and her contact person name is Sherrie Sport [phone].  Tony Saunders stated she doesn't know if they cover snf. CSW called Olin Hauser and left message on voicemail.   Expected Discharge Plan: Skilled Nursing Facility Barriers to Discharge: Insurance Authorization, Continued Medical Work up   Patient Goals and CMS Choice Patient states their goals for this hospitalization and ongoing recovery are:: To go to rehab CMS Medicare.gov Compare Post Acute Care list provided to:: Patient Choice offered to / list presented to : Patient  Expected Discharge Plan and Services Expected Discharge Plan: [place] arrangements for the past 2 months: Single Family Home                                      Prior Living Arrangements/Services Living arrangements for the past 2 months: Single Family Home Lives with:: Spouse Patient language and need for interpreter reviewed:: Yes Do you feel safe going back to the place where you live?: Yes      Need for Family Participation in Patient Care:  Yes (Comment) Care giver support system in place?: Yes (comment)   Criminal Activity/Legal Involvement Pertinent to Current Situation/Hospitalization: No - Comment as needed  Activities of Daily Living      Permission Sought/Granted Permission sought to share information with : Family Supports, Customer service manager    Share Information with NAME: Tony Saunders  Permission granted to share info w AGENCY: SNF  Permission granted to share info w Relationship: wife  Permission granted to share info w Contact Information: [phone]  Emotional Assessment Appearance:: Appears stated age Attitude/Demeanor/Rapport: Apprehensive Affect (typically observed): Apprehensive Orientation: : Oriented to  Time, Oriented to Place, Oriented to Self, Oriented to Situation Alcohol / Substance Use: Not Applicable Psych Involvement: No (comment)  Admission diagnosis:  Swelling [R60.9] Peripheral edema [R60.9] Neutropenia with fever (HCC) [D70.9, R50.81] Neutropenic fever ([place]) [D70.9, R50.81] Patient Active Problem List   Diagnosis Date Noted  . Pleural effusion 09/03/2019  . DVT (deep venous thrombosis) ([place]) 08/30/2019  . Neutropenic fever ([place]) 08/29/2019  . Neutropenia with fever ([place]) 08/29/2019  . Port-A-Cath in place 07/30/2019  . Symptomatic anemia   . Persistent atrial fibrillation ([place])   . Cardiogenic shock ([place]) 07/08/2019  . Acute respiratory failure with hypoxia ([place]) 07/08/2019  . Severe sepsis ([place]) 07/08/2019  . Neutropenic sepsis (  [place]) 07/08/2019  . Febrile neutropenia ([place])   . Lymphoma ([place])   . Pulmonary infiltrate   . Hypotension   . Protein-calorie malnutrition, severe 06/21/2019  . Atrial flutter with rapid ventricular response ([place])   . Pressure ulcer 06/16/2019  . Oropharyngeal candidiasis 06/16/2019  . Hypokalemia 06/16/2019  . Ileus ([place]) 06/16/2019  . Pancytopenia ([place]) 06/15/2019  . Acute on chronic systolic heart failure ([place]) 06/13/2019  .  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 Atrial fibrillation with RVR ([place]) 05/23/2019  . Left leg swelling   . PAF (persistent-paroxysmal atrial fibrillation) ([place]) 04/16/2019  . Ischemic cardiomyopathy 04/16/2019  . Severe mitral regurgitation by prior echocardiogram-likely ischemic with tethered posterior leaflet 03/31/2019  . Elevated transaminase level 03/31/2019  . 3vessel CAD- S/P PCI 03/27/2019  . Acute ST elevation myocardial infarction (STEMI) of inferior wall ([place]) 03/27/2019  . Hyperlipidemia with target LDL less than 70 03/27/2019  . 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   PCP:  [name], [name], MD Pharmacy:   [facility] [address] [place] [address] Phone: [phone] Fax: [phone]     Social Determinants of Health (SDOH) Interventions    Readmission Risk Interventions Readmission Risk Prevention Plan 07/23/2019  Transportation Screening Complete  Medication Review (RN Care Manager) Complete  HRI or [place] Complete  SW Recovery Care/Counseling Consult Complete  Palliative Care Screening Not [place] Patient Refused  Some recent data might be hidden   Emeterio Reeve, Latanya Presser, [place] Social Worker [phone]

## 2019-09-04 NOTE — Progress Notes (Signed)
Physical Therapy Treatment Patient Details Name: Tony Saunders MRN: [id number] DOB: 1955-11-24 Today's Date: 09/04/2019    History of Present Illness Tony Saunders is an 64 y.o. male with history of severe mitral regurgitation, seizure, paroxysmal atrial fibrillation, myocardial infarction, diffuse large B-cell lymphoma, CHF, CAD presents to the ER with a chief complaint of right hand pain and swelling.     PT Comments    Pt pleasant and willing to attempt standing but did not want to transfer to chair today. Pt with improved ability to rise from bed and stand but continues to need significant assist and unable to attempt gait today. Pt educated for HEP, progressive mobility and function.   94/57 (68) HR 69 96% on 6L    Follow Up Recommendations  SNF;Supervision/Assistance - 24 hour     Equipment Recommendations  None recommended by PT    Recommendations for Other Services       Precautions / Restrictions Precautions Precautions: Fall    Mobility  Bed Mobility Overal bed mobility: Needs Assistance Bed Mobility: Supine to Sit;Sit to Supine     Supine to sit: Min assist;HOB elevated Sit to supine: Mod assist   General bed mobility comments: pt with use of rail, cues and assist to fully elevate trunk with pt able to scoot to EOb with increased time once sitting. Return to bed assist to bring legs to surface and reposition  Transfers Overall transfer level: Needs assistance   Transfers: Sit to/from Stand Sit to Stand: Mod assist         General transfer comment: mod assist to rise from lower surface with pt unable to fully rise with bed height elevated grossly 6in pt able to stand with assist, cues for hand placement and performed x 3 trials with pt standing grossly 1 min each trial and able to perform limited side stepping toward [facility]  Ambulation/Gait             General Gait Details: unable today   Stairs             Wheelchair  Mobility    Modified Rankin (Stroke Patients Only)       Balance Overall balance assessment: Needs assistance   Sitting balance-Leahy Scale: Fair Sitting balance - Comments: PT able to sit EOB without support with perturbation UE support needed   Standing balance support: Bilateral upper extremity supported Standing balance-Leahy Scale: Poor                              Cognition Arousal/Alertness: Awake/alert Behavior During Therapy: Flat affect Overall Cognitive Status: Within Functional Limits for tasks assessed                                        Exercises General Exercises - Lower Extremity Long Arc Quad: AROM;Both;10 reps;Seated Hip Flexion/Marching: AROM;Both;10 reps;Seated    General Comments        Pertinent Vitals/Pain Faces Pain Scale: Hurts little more Pain Location: generalized and bil ankles Pain Descriptors / Indicators: Aching;Tightness Pain Intervention(s): Limited activity within patient's tolerance;Repositioned;Monitored during session    Home Living                      Prior Function            PT Goals (current goals can now  be found in the care plan section) Progress towards PT goals: Progressing toward goals    Frequency    Min 3X/week      PT Plan Current plan remains appropriate    Co-evaluation              AM-PAC PT "6 Clicks" Mobility   Outcome Measure  Help needed turning from your back to your side while in a flat bed without using bedrails?: A Little Help needed moving from lying on your back to sitting on the side of a flat bed without using bedrails?: A Lot Help needed moving to and from a bed to a chair (including a wheelchair)?: A Lot Help needed standing up from a chair using your arms (e.g., wheelchair or bedside chair)?: A Lot Help needed to walk in hospital room?: Total Help needed climbing 3-5 steps with a railing? : Total 6 Click Score: 11    End of Session  Equipment Utilized During Treatment: Gait belt;Oxygen Activity Tolerance: Patient tolerated treatment well Patient left: in bed;with call bell/phone within reach Nurse Communication: Mobility status PT Visit Diagnosis: Muscle weakness (generalized) (M62.81);Difficulty in walking, not elsewhere classified (R26.2)     Time: 0160-1093 PT Time Calculation (min) (ACUTE ONLY): 30 min  Charges:  $Therapeutic Exercise: 8-22 mins $Therapeutic Activity: 8-22 mins                     Lianna Sitzmann P, PT Acute Rehabilitation Services Pager: [phone] Office: [phone]    Tiphanie Vo B Kauan Kloosterman 09/04/2019, 2:09 PM

## 2019-09-04 NOTE — Consult Note (Addendum)
NAME:  Tony Saunders, MRN:  [id number], DOB:  [birth year], LOS: 6 ADMISSION DATE:  08/28/2019, CONSULTATION DATE: 09/03/2019 REFERRING MD: Dr. 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  Pulmonary critical care consulted for evaluation of right-sided pleural effusion.  History of present illness   This is a 64 year old gentleman past medical history of coronary artery disease, status post PCI in March 2021, currently on Plavix alone was on aspirin prior and Xarelto.  Has PAF in addition.  Also has chronic ischemic cardiomyopathy with combined systolic and diastolic heart failure.  Patient also has severe MR.  Recent hospital admission for decompensated failure being diuresed with metolazone.  But he still approximately 20 pounds above his dry weight.  Serum creatinine has been stable.  Has been seen by advanced heart failure service.  He has had bleeding issues to include hemoptysis and epistaxis he was requiring transfusion his hemoglobin is now stable.  Currently only on Plavix.  He had neutropenic fever on chemotherapy for lymphoma.  Initial white blood cell count of 1.2, temperature of 101.  He was started on empiric antibiotics to include vancomycin plus cefepime.  His fevers have resolved.  His white blood cell count now is back up.  It is now at 6.2.  All initial cultures including urine and blood no growth to date, MRSA PCR negative.  Past Medical History   Past Medical History:  Diagnosis Date  . 3vessel CAD- S/P PCI 03/27/2019   Remote pRCA PCI-stenting 2006. Acute MI 03/27/2019 treated with urgent m-d RCA PCI and stent (x 2) followed by staged PCI DES to CFX (x2 stents) and LAD (x1) on 03/29/2019  . Acute ST elevation myocardial infarction (STEMI)  of inferior wall ([place]) 03/27/2019   Pt presented 03/27/2019 with an acute inferior MI- Cath 03/27/19 showed thrombotic occlusion of mid-distal RCA with a patent previously placed pRCA stent -2 overlapping Synergy DES from PDA back into proximal RCA stented segment and PTCA of RPAV (jailed)-PL 3 He also had concomitant high garde CFX and LAD disease (staged PCI) with an EF of 30-35%  . Chronic combined systolic and diastolic CHF, NYHA class 2 and ACC/AHA stage C ([place]) 03/27/2019   EF 30 to 40% with diffuse inferior hypokinesis/akinesis; severe ischemic MR  . 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 Myocardial infarction [facility]) 2006   PCI of the RCA  . PAF (persistent-paroxysmal atrial fibrillation) ([place]) 04/16/2019   Brief- post MI  . Seizure, temporal lobe ([place]) 2017   most recent 07/23/15-on Keppra and Lamictal  . Severe mitral regurgitation by prior echocardiogram-likely ischemic with tethered posterior leaflet 03/31/2019   Repeat echo 4-6 weeks post MI     Significant Hospital Events     Consults:  Advanced heart failure service  Procedures:  09/03/2019 Thoracentesis>> right pleural effusion>> 2800 cc's of clear yellow fluid >> Cultures pending  Significant Diagnostic Tests:  Chest x-ray 09/03/2019 bilateral pleural effusions right greater than left.  Micro Data:  Blood cultures no growth to date MRSA PCR negative COVID-19 negative  Antimicrobials:  Vancomycin, stop Cefepime continued  Interim history/subjective:  CXR post Thoracentesis>> complete resolution or right effusion Net negative 2.6 L Urine output 8/16>> Net negative 4.8 L 8/16 Weight down to 176 8/17  Required BiPap overnight Currently on HFNC at 9L with sat of 99% T Max 98.3, WBC 6.2  on 8/16 Platelets 84,000 Creatinine up to 1.27 ( 1.16)  Objective   Blood pressure (!) 103/52, pulse 64, temperature (!) 97.4 F (36.3 C), temperature source Oral, resp. rate (!) 30, weight  80.1 kg, SpO2 100 %.    FiO2 (%):  [40 %] 40 %   Intake/Output Summary (Last 24 hours) at 09/04/2019 1116 Last data filed at 09/04/2019 1100 Gross per 24 hour  Intake 474 ml  Output 5300 ml  Net -4826 ml   Filed Weights   09/02/19 0454 09/03/19 0300 09/04/19 0331  Weight: 90.4 kg 87 kg 80.1 kg    Examination: General: elderly frail cachetic male , in NAD at present HENT: temporal muscle wasting, NCAT , HFNC, + JVD, No LAD No thyromegally Lungs: Bilateral chest excursion, Coarse to Diminished per bases Cardiovascular: S1, S2, RRR, No R or G, + M Abdomen: soft, nt, nd , Body mass index is 23.95 kg/m. Extremities: BL edema 1+ to  Thigh, Bilateral Una Boots, Brisk cap refill ( < 3 seconds) Neuro: alert, following commands , MAE x 4, A&O x 3, cranial nerves intact, Appropriate and pleasant GU: Not assessed   Resolved Hospital Problem list     Assessment & Plan:   Acute hypoxemic respiratory failure requiring nasal cannula O2 supplementation Bilateral pleural effusion In the setting of neutropenic fever, sepsis Decompensated acute on chronic systolic and diastolic heart failure, ischemic cardiomyopathy history of coronary disease status post PCI baseline. Right Thoracentesis 8/16 >> 2800 cc's yellow pleural fluid  Significant improvement in respiratory status Plan: Wean oxygen as able with sat goal > 94% Follow Pleural Fluid for micro. Continue cefepime for now>> adjust when micro/ sensitivities  result Trend WBC and fever curve Continue diuresis per heart failure service. BiPAP prn Trend CXR prn     Labs   CBC: Recent Labs  Lab 08/28/19 2312 08/28/19 2312 08/30/19 0526 08/31/19 0550 09/01/19 1148 09/02/19 0418 09/03/19 0415  WBC 1.2*   < > 2.0* 5.9 5.1 6.2 6.2  NEUTROABS 0.9*  --  1.6* 5.8  --   --   --   HGB 7.5*   < > 6.4* 7.8* 7.7* 7.4* 9.1*  HCT 25.6*   < > 21.2* 26.6* 25.1* 25.0* 30.0*  MCV 97.0   < > 95.5 95.0 93.0 94.3 93.2  PLT 62*   < > 48* 83* 71*  82* 84*   < > = values in this interval not displayed.    Basic Metabolic Panel: Recent Labs  Lab 08/30/19 0526 08/30/19 0526 08/31/19 0550 09/01/19 1148 09/02/19 0418 09/03/19 0415 09/03/19 1820  NA 134*   < > 134* 136 138 137 137  K 3.7   < > 3.5 3.0* 3.2* 3.1* 4.0  CL 96*   < > 97* 96* 97* 96* 92*  CO2 29   < > 26 29 31  32 35*  GLUCOSE 84   < > 128* 82 82 86 85  BUN 17   < > 22 22 20 19 22   CREATININE 0.84   < > 0.98 1.15 1.19 1.16 1.27*  CALCIUM 7.8*   < > 7.8* 7.5* 7.5* 7.5* 7.4*  MG 1.6*  --   --   --  1.9 1.8  --    < > = values in this interval not displayed.   GFR: Estimated Creatinine Clearance: 66.2 mL/min (A) (by C-G formula based on  SCr of 1.27 mg/dL (H)). Recent Labs  Lab 08/28/19 2312 08/29/19 0219 08/30/19 0526 08/31/19 0550 09/01/19 1148 09/02/19 0418 09/03/19 0415  WBC 1.2*  --    < > 5.9 5.1 6.2 6.2  LATICACIDVEN 2.1* 1.1  --   --   --   --   --    < > = values in this interval not displayed.    Liver Function Tests: Recent Labs  Lab 08/28/19 2312 08/30/19 0526 09/03/19 1820  AST 14* 12* 27  ALT 13 13 16   ALKPHOS 113 77 90  BILITOT 0.8 0.8 1.6*  PROT 5.1* 4.2* 4.1*  ALBUMIN 2.6* 2.0* 1.9*   No results for input(s): LIPASE, AMYLASE in the last 168 hours. No results for input(s): AMMONIA in the last 168 hours.  ABG    Component Value Date/Time   HCO3 22.0 03/29/2019 1012   TCO2 23 03/29/2019 1012   ACIDBASEDEF 2.0 03/29/2019 1012   O2SAT 60.0 07/20/2019 0515     Coagulation Profile: No results for input(s): INR, PROTIME in the last 168 hours.  Cardiac Enzymes: No results for input(s): CKTOTAL, CKMB, CKMBINDEX, TROPONINI in the last 168 hours.  HbA1C: Hgb A1c MFr Bld  Date/Time Value Ref Range Status  03/27/2019 07:06 AM 5.3 4.8 - 5.6 % Final    Comment:    (NOTE) Pre diabetes:          5.7%-6.4% Diabetes:              >6.4% Glycemic control for   <7.0% adults with diabetes     CBG: No results for input(s): GLUCAP  in the last 168 hours. Allergies No Known Allergies   Home Medications  Prior to Admission medications   Medication Sig Start Date End Date Taking? Authorizing Provider  acetaminophen (TYLENOL) 325 MG tablet Take 650 mg by mouth every 6 (six) hours as needed for mild pain or headache.   Yes [provider]  allopurinol (ZYLOPRIM) 300 MG tablet TAKE ONE TABLET BY MOUTH DAILY Patient taking differently: Take 300 mg by mouth daily.  07/27/19  Yes Wyatt Portela, MD  amiodarone (PACERONE) 200 MG tablet Take 1 tablet (200 mg total) by mouth daily. 07/26/19  Yes Shawna Clamp, MD  budesonide (PULMICORT) 0.5 MG/2ML nebulizer solution Take 2 mLs (0.5 mg total) by nebulization 2 (two) times daily. Patient taking differently: Take 0.5 mg by nebulization 2 (two) times daily as needed (wheezing/SOB).  07/25/19  Yes Shawna Clamp, MD  clopidogrel (PLAVIX) 75 MG tablet Take 1 tablet (75 mg total) by mouth daily. 06/07/19  Yes Leonie Man, MD  digoxin (LANOXIN) 0.125 MG tablet Take 1 tablet (125 mcg total) by mouth daily. 07/05/19 07/04/20 Yes Bensimhon, Shaune Pascal, MD  feeding supplement, ENSURE ENLIVE, (ENSURE ENLIVE) LIQD Take 237 mLs by mouth 2 (two) times daily between meals. 06/30/19  Yes Antonieta Pert, MD  furosemide (LASIX) 40 MG tablet Take 1 tablet (40 mg total) by mouth daily. 07/26/19  Yes Shawna Clamp, MD  lamoTRIgine (LAMICTAL) 100 MG tablet Take 1 tablet (100 mg total) by mouth 2 (two) times daily. 08/27/19  Yes Lomax, Amy, NP  levETIRAcetam (KEPPRA) 750 MG tablet Take 1 tablet (750 mg total) by mouth 2 (two) times daily. 08/27/19  Yes Lomax, Amy, NP  midodrine (PROAMATINE) 5 MG tablet Take 1 tablet (5 mg total) by mouth 3 (three) times daily with meals. 07/05/19  Yes Bensimhon, Shaune Pascal, MD  ondansetron (ZOFRAN) 4 MG tablet Take 1 tablet (  4 mg total) by mouth every 6 (six) hours as needed for nausea. 05/25/19  Yes Georgette Shell, MD  pantoprazole (PROTONIX) 40 MG tablet Take 1 tablet (40 mg  total) by mouth daily. 06/30/19  Yes Kc, Maren Beach, MD  potassium chloride SA (KLOR-CON) 20 MEQ tablet Take 1 tablet (20 mEq total) by mouth daily. 07/05/19  Yes Bensimhon, Shaune Pascal, MD  rosuvastatin (CRESTOR) 5 MG tablet Take 1 tablet (5 mg total) by mouth daily. 04/16/19 08/29/19 Yes Kilroy, Luke K, PA-C  sertraline (ZOLOFT) 25 MG tablet Take 1 tablet (25 mg total) by mouth daily. 08/27/19  Yes Lomax, Amy, NP  lidocaine-prilocaine (EMLA) cream Apply 1 application topically as needed. 05/30/19   Wyatt Portela, MD  mirtazapine (REMERON SOL-TAB) 15 MG disintegrating tablet Take 1 tablet (15 mg total) by mouth at bedtime. Patient not taking: Reported on 08/29/2019 08/20/19 04/16/20  Wyatt Portela, MD  predniSONE (DELTASONE) 50 MG tablet Take 2 tablets for 5 days every 21 days with chemotherapy. 05/30/19   Wyatt Portela, MD  prochlorperazine (COMPAZINE) 10 MG tablet Take 1 tablet (10 mg total) by mouth every 6 (six) hours as needed for nausea or vomiting. 05/30/19   Wyatt Portela, MD  Wound Dressings (MEDIHONEY WOUND/BURN DRESSING) GEL Apply 1.5 mLs topically as needed. 08/20/19   Wyatt Portela, MD      Magdalen Spatz, MSN, AGACNP-BC [place] for personal pager PCCM on call pager [phone] [facility] 09/04/2019 11:16 AM     PCCM:  64 yo AHRF, BL effusion, acute on chronic systolic heart failure   BP (!) 92/54 (BP Location: Left Arm)   Pulse 64   Temp 98 F (36.7 C) (Oral)   Resp (!) 28   Wt 80.1 kg   SpO2 99%   BMI 23.95 kg/m   Gen: frail cachetic male HENT: temporalis muscle wasting  Heart: RRR s1 s2 Lungs: clear in the right, no crackles   Labs reviewed: transudative   A:  AHRF Ischemic cardiomyopathy  Right sided pleural effusion   P: Continue diuresis  If otherside needs tapped let me know  HF regimen per HF service We appreciate the consult.   Pulm will sign off at this time.   [facility] 09/04/2019 4:56 PM

## 2019-09-04 NOTE — Progress Notes (Signed)
Orthopedic Tech Progress Note Patient Details:  Tony Saunders May 09, 1955 [id number] Went to apply unna boots to pt legs and noticed bruising and red marks that were not there during the time of the first application. I spoke to the MD concerning my observations of the pt's legs and was told to delay application of the unna boots until the pt's legs can be evaluated. Unna boots have been rescheduled to be applied tomorrow before pt discharge. Patient ID: Tony Saunders, male   DOB: Aug 07, 1955, 64 y.o.   MRN: [id number]   [name] 09/04/2019, 3:42 PM

## 2019-09-04 NOTE — Progress Notes (Signed)
With 2 loose stool in mod. amount.  imodium  2 mg given. Gerhardt cream applied to perineum.

## 2019-09-04 NOTE — Progress Notes (Addendum)
Progress Note    Tony Saunders  ZOX:[id number] DOB: 1955-06-04  DOA: 08/28/2019 PCP: [name], [name], MD    Brief Narrative:     Medical records reviewed and are as summarized below:  Tony Saunders is an 64 y.o. male with history of severe mitral regurgitation, seizure, paroxysmal atrial fibrillation, myocardial infarction, diffuse large B-cell lymphoma, CHF, CAD presents to the ER with a chief complaint of right hand pain and swelling. Found to have DVT in RUQ and neutropenic fever.  He was also markedly volume overloaded.  Has been slow to diurese and had thoracentesis x 1.    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 BIPAP PRN- wean O2 to room air as able 2. S/p Broad-spectrum antibiotics for any possible pneumonia 3. Last echo 6/21: EF 35-40%.  During prior hospitalization required milrinone-- cardiology consult appreciated: IV lasix and midodrine: on 8/17 his Cr appears to be trending up 4. S/p 2 units PRBC 5. Pulmonary toilet 6. PCCM consult for thoracentesis  Neutropenic fever with high grade lymphoma 1. White blood cell count 1.2 upon admission but trending up 2. T-max at home 101.7- no further temperature 3. Oncology consulted and recommended cefepime and vancomycin- if cultures negative in 24 hours, can stop abx-- will place stop date of 8/13 4. No source of infection identified at this time 5. Covid negative,blood cultures neg,urine culture neg,continue monitor  Right upper extremity DVT- port a cath placed 7/26 1. Duplex shows acute clot 2. lovenox for now-  was previously on xarelto but  this was stopped in in June due to anemia and epistaxis 3. It appears both eliquis and xarelto are cost prohibited and we need to give consideration to lovenox shots as per my discussion with Dr. Osker Mason coumadin is not a good choice for him  ADDENDUM: patient may qualify for medication assistance for eliquis.  Pharmacy to investigate.  Can transition from lovenox to eliquis in the AM if able  Protein calorie malnutrition 1. Feeding supplement  Hypomagnesemia/hypokalemia 1. repleted  Anemia of CD  1.  S/p 2 unit prbc  2.  R/o blood loss with hemoccult   Persistent atrial flutter 1. rate controlled 2. per cards, has failed cardioversion in past  Severe MR 1. per cards    Family Communication/Anticipated D/C date and plan/Code Status   DVT prophylaxis: Lovenox full dose Code Status: Full Code.  Family Communication: wife on phone 8/17 Disposition Plan: Status is: Inpatient  Remains inpatient appropriate because:Inpatient level of care appropriate due to severity of illness   Dispo: The patient is from: Home              Anticipated d/c is to: Home              Anticipated d/c date is: 3 days              Patient currently is not medically stable to d/c.         Medical Consultants:    Oncology  cardiology     Subjective:   Breathing better s/p thoracentesis Says he is having loose stools  Objective:  Vitals:   09/04/19 0400 09/04/19 0800 09/04/19 0857 09/04/19 0944  BP: 104/65 (!) 87/58    Pulse: 64 64 64 64  Resp: (!) 24 (!) 25 20   Temp: (!) 97.4 F (36.3 C)     TempSrc: Oral     SpO2: 100% 100% 100%   Weight:        Intake/Output Summary (Last 24 hours) at 09/04/2019 1104 Last data filed at 09/03/2019 2030 Gross per 24 hour  Intake 237 ml  Output 5300 ml  Net -5063 ml   Filed Weights   09/02/19 0454 09/03/19 0300 09/04/19 0331  Weight: 90.4 kg 87 kg 80.1 kg    Exam:  General: Appearance:    Ill appearing     Lungs:      Better aeration, wet cough, respirations mildly labored, on [place]  Heart:    Normal heart rate.   MS:   All extremities are intact. Improving edema in LE  Neurologic:   Awake, alert, oriented x 3. No apparent focal neurological           defect.       Data Reviewed:   I have personally reviewed following labs and imaging studies:  Labs: Labs show the following:   Basic Metabolic Panel: Recent Labs  Lab 08/30/19 0526 08/30/19 0526 08/31/19 0550 08/31/19 0550 09/01/19 1148 09/01/19 1148 09/02/19 0418 09/02/19 0418 09/03/19 0415 09/03/19 1820  NA 134*   < > 134*  --  136  --  138  --  137 137  K 3.7   < > 3.5   < > 3.0*   < > 3.2*   < > 3.1* 4.0  CL 96*   < > 97*  --  96*  --  97*  --  96* 92*  CO2 29   < > 26  --  29  --  31  --  32 35*  GLUCOSE 84   < > 128*  --  82  --  82  --  86 85  BUN 17   < > 22  --  22  --  20  --  19 22  CREATININE 0.84   < > 0.98  --  1.15  --  1.19  --  1.16 1.27*  CALCIUM 7.8*   < > 7.8*  --  7.5*  --  7.5*  --  7.5* 7.4*  MG 1.6*  --   --   --   --   --  1.9  --  1.8  --    < > = values in this interval not displayed.   GFR Estimated Creatinine Clearance: 66.2 mL/min (A) (by C-G formula based on SCr of 1.27 mg/dL (H)). Liver Function Tests: Recent Labs  Lab 08/28/19 2312 08/30/19 0526 09/03/19 1820  AST 14* 12* 27  ALT 13 13 16   ALKPHOS 113 77 90  BILITOT 0.8 0.8 1.6*  PROT 5.1* 4.2* 4.1*  ALBUMIN 2.6* 2.0* 1.9*   No results for input(s): LIPASE, AMYLASE in the last 168 hours. No results for input(s): AMMONIA in the last 168 hours. Coagulation profile No results for input(s): INR, PROTIME in the last 168 hours.  CBC: Recent Labs  Lab 08/28/19 2312 08/28/19 2312 08/30/19 0526 08/31/19 0550 09/01/19 1148 09/02/19 0418 09/03/19 0415  WBC 1.2*   < > 2.0* 5.9 5.1 6.2 6.2  NEUTROABS 0.9*  --  1.6* 5.8  --   --   --   HGB  7.5*   < > 6.4* 7.8* 7.7* 7.4* 9.1*  HCT 25.6*   < > 21.2* 26.6* 25.1* 25.0* 30.0*  MCV 97.0   < > 95.5  95.0 93.0 94.3 93.2  PLT 62*   < > 48* 83* 71* 82* 84*   < > = values in this interval not displayed.   Cardiac Enzymes: No results for input(s): CKTOTAL, CKMB, CKMBINDEX, TROPONINI in the last 168 hours. BNP (last 3 results) No results for input(s): PROBNP in the last 8760 hours. CBG: No results for input(s): GLUCAP in the last 168 hours. D-Dimer: No results for input(s): DDIMER in the last 72 hours. Hgb A1c: No results for input(s): HGBA1C in the last 72 hours. Lipid Profile: No results for input(s): CHOL, HDL, LDLCALC, TRIG, CHOLHDL, LDLDIRECT in the last 72 hours. Thyroid function studies: No results for input(s): TSH, T4TOTAL, T3FREE, THYROIDAB in the last 72 hours.  Invalid input(s): FREET3 Anemia work up: No results for input(s): VITAMINB12, FOLATE, FERRITIN, TIBC, IRON, RETICCTPCT in the last 72 hours. Sepsis Labs: Recent Labs  Lab 08/28/19 2312 08/29/19 0219 08/30/19 0526 08/31/19 0550 09/01/19 1148 09/02/19 0418 09/03/19 0415  WBC 1.2*  --    < > 5.9 5.1 6.2 6.2  LATICACIDVEN 2.1* 1.1  --   --   --   --   --    < > = values in this interval not displayed.    Microbiology Recent Results (from the past 240 hour(s))  Blood culture (routine x 2)     Status: None   Collection Time: 08/28/19 11:00 PM   Specimen: BLOOD LEFT ARM  Result Value Ref Range Status   Specimen Description BLOOD LEFT ARM  Final   Special Requests   Final    BOTTLES DRAWN AEROBIC AND ANAEROBIC Blood Culture adequate volume   Culture   Final    NO GROWTH 5 DAYS Performed at [facility], [address] [place], [place] [address]    Report Status 09/03/2019 FINAL  Final  Blood culture (routine x 2)     Status: None   Collection Time: 08/28/19 11:13 PM   Specimen: BLOOD LEFT HAND  Result Value Ref Range Status   Specimen Description BLOOD LEFT HAND  Final   Special Requests   Final    BOTTLES DRAWN AEROBIC ONLY Blood Culture adequate volume   Culture   Final    NO GROWTH 5  DAYS Performed at [facility], [place] [address], [place], [place] [address]    Report Status 09/03/2019 FINAL  Final  SARS Coronavirus 2 by RT PCR (hospital order, performed in [facility] hospital lab) Nasopharyngeal Nasopharyngeal Swab     Status: None   Collection Time: 08/29/19  1:33 AM   Specimen: Nasopharyngeal Swab  Result Value Ref Range Status   SARS Coronavirus 2 NEGATIVE NEGATIVE Final    Comment: (NOTE) SARS-CoV-2 target nucleic acids are NOT DETECTED.  The SARS-CoV-2 RNA is generally detectable in upper and lower respiratory specimens during the acute phase of infection. The lowest concentration of SARS-CoV-2 viral copies this assay can detect is 250 copies / mL. A negative result does not preclude SARS-CoV-2 infection and should not be used as the sole basis for treatment or other patient management decisions.  A negative result may occur with improper specimen collection / handling, submission of specimen other than nasopharyngeal swab, presence of viral mutation(s) within the areas targeted by this assay, and inadequate number of viral copies (<250  copies / mL). A negative result must be combined with clinical observations, patient history, and epidemiological information.  Fact Sheet for Patients:   StrictlyIdeas.no  Fact Sheet for Healthcare Providers: BankingDealers.co.za  This test is not yet approved or  cleared by the [place] FDA and has been authorized for detection and/or diagnosis of SARS-CoV-2 by FDA under an Emergency Use Authorization (EUA).  This EUA will remain in effect (meaning this test can be used) for the duration of the COVID-19 declaration under Section 564(b)(1) of the Act, 21 U.S.C. section 360bbb-3(b)(1), unless the authorization is terminated or revoked sooner.  Performed at [facility], [place] [address], [place], [place] [address]   Urine culture     Status: None    Collection Time: 08/29/19  1:36 AM   Specimen: Urine, Random  Result Value Ref Range Status   Specimen Description URINE, RANDOM  Final   Special Requests NONE  Final   Culture   Final    NO GROWTH Performed at [facility], [place] [address], [place], [place] [address]    Report Status 08/29/2019 FINAL  Final  Body fluid culture     Status: None (Preliminary result)   Collection Time: 09/03/19  3:00 PM   Specimen: Fluid  Result Value Ref Range Status   Specimen Description FLUID PLEURAL RIGHT  Final   Special Requests NONE  Final   Gram Stain   Final    RARE WBC PRESENT, PREDOMINANTLY MONONUCLEAR NO ORGANISMS SEEN Performed at [facility], [place] [address] [place], [place] [address]    Culture PENDING  Incomplete   Report Status PENDING  Incomplete    Procedures and diagnostic studies:  DG CHEST PORT 1 VIEW  Result Date: 09/03/2019 CLINICAL DATA:  Status post thoracentesis. EXAM: PORTABLE CHEST 1 VIEW COMPARISON:  09/03/2019 FINDINGS: Interval near complete evacuation of the right pleural fluid collection. No postprocedural pneumothorax is identified. Persistent underlying emphysematous changes, mild interstitial edema and patchy areas of atelectasis. Small left pleural effusion persists. IMPRESSION: Status post right thoracentesis with near complete evacuation of the right pleural fluid collection. No postprocedural pneumothorax. Electronically Signed   By: Marijo Sanes M.D.   On: 09/03/2019 14:58   DG CHEST PORT 1 VIEW  Result Date: 09/03/2019 CLINICAL DATA:  Pneumonia EXAM: PORTABLE CHEST 1 VIEW COMPARISON:  08/31/2019 FINDINGS: Cardiomegaly with moderate interstitial edema. Moderate right and small left pleural effusions. No pneumothorax. Additional chronic scarring in the bilateral upper lobes. Right chest power port terminates cavoatrial junction. IMPRESSION: Cardiomegaly with moderate interstitial edema. Moderate right and small left pleural effusions.  Electronically Signed   By: Julian Hy M.D.   On: 09/03/2019 09:51    Medications:   . (feeding supplement) PROSource Plus  30 mL Oral BID BM  . sodium chloride   Intravenous Once  . sodium chloride   Intravenous Once  . acidophilus  2 capsule Oral Daily  . amiodarone  200 mg Oral Daily  . budesonide  0.5 mg Nebulization BID  . Chlorhexidine Gluconate Cloth  6 each Topical Daily  . clopidogrel  75 mg Oral Daily  . digoxin  125 mcg Oral Daily  . enoxaparin (LOVENOX) injection  90 mg Subcutaneous Q12H  . feeding supplement (ENSURE ENLIVE)  237 mL Oral BID BM  . furosemide  80 mg Intravenous BID  . guaiFENesin  600 mg Oral BID  . ipratropium-albuterol  3 mL Nebulization BID  . lamoTRIgine  100 mg Oral BID  .  levETIRAcetam  750 mg Oral BID  . midodrine  10 mg Oral TID WC  . pantoprazole  40 mg Oral Daily  . potassium chloride  40 mEq Oral TID  . sertraline  25 mg Oral Daily  . spironolactone  12.5 mg Oral BID   Continuous Infusions: . magnesium sulfate bolus IVPB       LOS: 6 days   Geradine Girt  [facility]   How to contact the [facility] Attending or Consulting provider [place] or covering provider during after hours [place], for this patient?  1. Check the care team in [facility] and look for a) attending/consulting [facility] provider listed and b) the [facility] team listed 2. Log into [email or web address] and use 's universal password to access. If you do not have the password, please contact the hospital operator. 3. Locate the [facility] provider you are looking for under [facility] and page to a number that you can be directly reached. 4. If you still have difficulty reaching the provider, please page the [facility] (Director on Call) for the Hospitalists listed on amion for assistance.  09/04/2019, 11:04 AM

## 2019-09-05 ENCOUNTER — Telehealth (HOSPITAL_COMMUNITY): Payer: Self-pay | Admitting: Pharmacy Technician

## 2019-09-05 ENCOUNTER — Encounter (HOSPITAL_COMMUNITY): Payer: No Typology Code available for payment source | Admitting: Internal Medicine

## 2019-09-05 DIAGNOSIS — I82621 Acute embolism and thrombosis of deep veins of right upper extremity: Secondary | ICD-10-CM

## 2019-09-05 DIAGNOSIS — E43 Unspecified severe protein-calorie malnutrition: Secondary | ICD-10-CM

## 2019-09-05 DIAGNOSIS — R5081 Fever presenting with conditions classified elsewhere: Secondary | ICD-10-CM

## 2019-09-05 DIAGNOSIS — D709 Neutropenia, unspecified: Secondary | ICD-10-CM

## 2019-09-05 DIAGNOSIS — Z95828 Presence of other vascular implants and grafts: Secondary | ICD-10-CM

## 2019-09-05 LAB — BASIC METABOLIC PANEL
Anion gap: 6 (ref 5–15)
BUN: 18 mg/dL (ref 8–23)
CO2: 42 mmol/L — ABNORMAL HIGH (ref 22–32)
Calcium: 7.2 mg/dL — ABNORMAL LOW (ref 8.9–10.3)
Chloride: 88 mmol/L — ABNORMAL LOW (ref 98–111)
Creatinine, Ser: 1.04 mg/dL (ref 0.61–1.24)
GFR calc Af Amer: >60 mL/min (ref >60)
GFR calc non Af Amer: >60 mL/min (ref >60)
Glucose, Bld: 85 mg/dL (ref 70–99)
Potassium: 3.3 mmol/L — ABNORMAL LOW (ref 3.5–5.1)
Sodium: 136 mmol/L (ref 135–145)

## 2019-09-05 LAB — CBC
HCT: 29.4 % — ABNORMAL LOW (ref 39.0–52.0)
Hemoglobin: 8.8 g/dL — ABNORMAL LOW (ref 13.0–17.0)
MCH: 28.2 pg (ref 26.0–34.0)
MCHC: 29.9 g/dL — ABNORMAL LOW (ref 30.0–36.0)
MCV: 94.2 fL (ref 80.0–100.0)
Platelets: 79 10*3/uL — ABNORMAL LOW (ref 150–400)
RBC: 3.12 MIL/uL — ABNORMAL LOW (ref 4.22–5.81)
RDW: 17.9 % — ABNORMAL HIGH (ref 11.5–15.5)
WBC: 6.3 10*3/uL (ref 4.0–10.5)
nRBC: 0 % (ref 0.0–0.2)

## 2019-09-05 MED ORDER — POTASSIUM CHLORIDE CRYS ER 20 MEQ PO TBCR
40.0000 meq | EXTENDED_RELEASE_TABLET | Freq: Two times a day (BID) | ORAL | Status: AC
  Administered 2019-09-05 (×2): 40 meq via ORAL
  Filled 2019-09-05 (×2): qty 2

## 2019-09-05 MED ORDER — POTASSIUM CHLORIDE CRYS ER 20 MEQ PO TBCR
40.0000 meq | EXTENDED_RELEASE_TABLET | ORAL | Status: AC
  Administered 2019-09-05: 40 meq via ORAL
  Filled 2019-09-05: qty 2

## 2019-09-05 MED ORDER — FUROSEMIDE 80 MG PO TABS
80.0000 mg | ORAL_TABLET | Freq: Every day | ORAL | Status: DC
  Administered 2019-09-06 – 2019-09-09 (×4): 80 mg via ORAL
  Filled 2019-09-05 (×4): qty 1

## 2019-09-05 MED ORDER — ENSURE ENLIVE PO LIQD
237.0000 mL | Freq: Three times a day (TID) | ORAL | Status: DC
  Administered 2019-09-05 – 2019-09-09 (×10): 237 mL via ORAL

## 2019-09-05 NOTE — Progress Notes (Addendum)
Advanced Heart Failure Rounding Note   Subjective:    Yesterday diuresed with IV lasix. Overall weight down 40 pounds.    Feeling ok. Denies SOB.   Objective:   Weight Range:  Vital Signs:   Temp:  [97.7 F (36.5 C)-98.2 F (36.8 C)] 98.2 F (36.8 C) (08/18 0800) Pulse Rate:  [64-72] 64 (08/18 0800) Resp:  [18-30] 20 (08/18 0800) BP: (89-103)/(50-61) 96/60 (08/18 0800) SpO2:  [95 %-100 %] 99 % (08/18 0800) Weight:  [75.3 kg] 75.3 kg (08/18 0706) Last BM Date: 09/04/19  Weight change: Filed Weights   09/03/19 0300 09/04/19 0331 09/05/19 0706  Weight: 87 kg 80.1 kg 75.3 kg    Intake/Output:   Intake/Output Summary (Last 24 hours) at 09/05/2019 0903 Last data filed at 09/05/2019 0600 Gross per 24 hour  Intake 237 ml  Output 3700 ml  Net -3463 ml     Physical Exam: General:  Appears weak. No resp difficulty HEENT: normal Neck: supple. JVP 6-7 . Carotids 2+ bilat; no bruits. No lymphadenopathy or thryomegaly appreciated. Cor: PMI nondisplaced. Irregular rate & rhythm. No rubs, gallops or murmurs. Lungs: clear Abdomen: soft, nontender, nondistended. No hepatosplenomegaly. No bruits or masses. Good bowel sounds. Extremities: no cyanosis, clubbing, rash, LLE 1+ edema >RLE no edema Neuro: alert & orientedx3, cranial nerves grossly intact. moves all 4 extremities w/o difficulty. Affect pleasant   Telemetry:  A fib     Labs: Basic Metabolic Panel: Recent Labs  Lab 08/30/19 0526 08/31/19 0550 09/02/19 0418 09/02/19 0418 09/03/19 0415 09/03/19 0415 09/03/19 1820 09/04/19 1122 09/05/19 0535  NA 134*   < > 138  --  137  --  137 137 136  K 3.7   < > 3.2*  --  3.1*  --  4.0 3.2* 3.3*  CL 96*   < > 97*  --  96*  --  92* 91* 88*  CO2 29   < > 31  --  32  --  35* 40* 42*  GLUCOSE 84   < > 82  --  86  --  85 113* 85  BUN 17   < > 20  --  19  --  22 20 18   CREATININE 0.84   < > 1.19  --  1.16  --  1.27* 1.15 1.04  CALCIUM 7.8*   < > 7.5*   < > 7.5*   < > 7.4*  7.4* 7.2*  MG 1.6*  --  1.9  --  1.8  --   --   --   --    < > = values in this interval not displayed.    Liver Function Tests: Recent Labs  Lab 08/30/19 0526 09/03/19 1820  AST 12* 27  ALT 13 16  ALKPHOS 77 90  BILITOT 0.8 1.6*  PROT 4.2* 4.1*  ALBUMIN 2.0* 1.9*   No results for input(s): LIPASE, AMYLASE in the last 168 hours. No results for input(s): AMMONIA in the last 168 hours.  CBC: Recent Labs  Lab 08/30/19 0526 08/30/19 0526 08/31/19 0550 08/31/19 0550 09/01/19 1148 09/02/19 0418 09/03/19 0415 09/04/19 1122 09/05/19 0535  WBC 2.0*   < > 5.9   < > 5.1 6.2 6.2 6.9 6.3  NEUTROABS 1.6*  --  5.8  --   --   --   --   --   --   HGB 6.4*   < > 7.8*   < > 7.7* 7.4* 9.1* 9.5* 8.8*  HCT  21.2*   < > 26.6*   < > 25.1* 25.0* 30.0* 31.3* 29.4*  MCV 95.5   < > 95.0   < > 93.0 94.3 93.2 92.9 94.2  PLT 48*   < > 83*   < > 71* 82* 84* 76* 79*   < > = values in this interval not displayed.    Cardiac Enzymes: No results for input(s): CKTOTAL, CKMB, CKMBINDEX, TROPONINI in the last 168 hours.  BNP: BNP (last 3 results) Recent Labs    07/13/19 0200 07/14/19 0500 07/16/19 0629  BNP 608.0* 608.8* 675.9*    ProBNP (last 3 results) No results for input(s): PROBNP in the last 8760 hours.    Other results:  Imaging: DG CHEST PORT 1 VIEW  Result Date: 09/03/2019 CLINICAL DATA:  Status post thoracentesis. EXAM: PORTABLE CHEST 1 VIEW COMPARISON:  09/03/2019 FINDINGS: Interval near complete evacuation of the right pleural fluid collection. No postprocedural pneumothorax is identified. Persistent underlying emphysematous changes, mild interstitial edema and patchy areas of atelectasis. Small left pleural effusion persists. IMPRESSION: Status post right thoracentesis with near complete evacuation of the right pleural fluid collection. No postprocedural pneumothorax. Electronically Signed   By: Marijo Sanes M.D.   On: 09/03/2019 14:58   DG CHEST PORT 1 VIEW  Result Date:  09/03/2019 CLINICAL DATA:  Pneumonia EXAM: PORTABLE CHEST 1 VIEW COMPARISON:  08/31/2019 FINDINGS: Cardiomegaly with moderate interstitial edema. Moderate right and small left pleural effusions. No pneumothorax. Additional chronic scarring in the bilateral upper lobes. Right chest power port terminates cavoatrial junction. IMPRESSION: Cardiomegaly with moderate interstitial edema. Moderate right and small left pleural effusions. Electronically Signed   By: Julian Hy M.D.   On: 09/03/2019 09:51     Medications:     Scheduled Medications: . (feeding supplement) PROSource Plus  30 mL Oral BID BM  . sodium chloride   Intravenous Once  . sodium chloride   Intravenous Once  . acidophilus  2 capsule Oral Daily  . amiodarone  200 mg Oral Daily  . budesonide  0.5 mg Nebulization BID  . Chlorhexidine Gluconate Cloth  6 each Topical Daily  . clopidogrel  75 mg Oral Daily  . digoxin  125 mcg Oral Daily  . enoxaparin (LOVENOX) injection  80 mg Subcutaneous Q12H  . feeding supplement (ENSURE ENLIVE)  237 mL Oral BID BM  . furosemide  80 mg Intravenous BID  . guaiFENesin  600 mg Oral BID  . ipratropium-albuterol  3 mL Nebulization BID  . lamoTRIgine  100 mg Oral BID  . levETIRAcetam  750 mg Oral BID  . midodrine  10 mg Oral TID WC  . pantoprazole  40 mg Oral Daily  . potassium chloride  40 mEq Oral Daily  . potassium chloride  40 mEq Oral Q3H  . sertraline  25 mg Oral Daily  . spironolactone  12.5 mg Oral BID    Infusions: . magnesium sulfate bolus IVPB      PRN Medications: acetaminophen **OR** acetaminophen, loperamide, morphine injection, oxyCODONE, oxymetazoline, polyethylene glycol, prochlorperazine   Assessment/Plan:   1. Acute on Chronic Systolic Heart Failure - 2/2 ICM, recent MI 3/21 w/ high grade LAD, LCx and RCA lesions, all treated w/ PCI +DES - Echo 6/21 EF 35-40%. RV normal. Severe MR  - multiple recent admissions for a/c CHF c/b low output, required milrinone +  levophed last admit 6-7/21 (d/c wt 172 lb) - Recurrent a/c CHF + volume overload.  - doubt low output HF. Suspect low BP  is secondary to intravascular volume depletion w/ hypoalbuminemia contributing (albumin 2.0)  - Overall weight down 40 pounds.  Stop IV lasix and start lasix 80 mg po daily.  - Supp K.  - Continue Spiro 12.5 mg bid - Continue Midodrine 10 mg tid.  - Continue digoxin 0.125  - Renal function stable.   2. Acute RUE DVT - UE Venous doppler 8/11 + for DVT - Chest CT negative for PE - in the setting of malignancy/ hypercoagulable state - now on Lovenox, management per primary team. They will d/w Dr. Alen Blew  - hgb stable at 8.8  - Could eliquis.tommorow 5 mg twice a day. Personally discussed with pharmacy.  - He would d/c with 30 day free card and set up patient assistance.   3. Persistent Atrial Flutter - failed DC-CVseveral times recently - Rate controlled.   - Anticoagulation plan for eliquis.   5. Severe MR - felt to be ischemic MR - May be possible MitraClip candidate depending on lymphoma course  6. High Grade Lymphoma - diagnosed 6/21.  - CT w/ extensive retroperitoneal, pelvic, and inguinal lymphadenopathy. There is also involvement of the spleen and near complete infiltration of the right kidney. 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 Suspicious for infiltrating disease of the right iliac and left pubic bones.  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 Alen Blew. Most recent f/u 8/2. Notation regarding prognosis, "Prognosis: Aggressive therapy remains warranted at this time. His disease remains curable potentially if he achieves a complete response upon completing chemotherapy".   7. Neutropenic fever - on chemotherapy for lymphoma - WBC 1.2 on admit, mTemp of 101 at home. Lactic acid 2.2 on admit - on empiric abx w/ vanc + cefepime  - CXR -  for PNA. UA -  - Urine and Blood Cultures NGTD - Resolved.  - abx duration per primary team/oncology   8. CAD:  - s/p pRCA PCI in 2006 - s/p recent MI with multiple stents in 3/21 (RCA x 2, LCX x 3, LAD x 1) - did not tolerate statin w/ elevated LFTs - not on ? blocker w/ recent low output HF / hypotension  - had not been on ASA given Plavix + Xarelto use (discontinued 7/21)  9. Bleeding - Xarelto was discontinued 7/21 due to hemoptysis and epistaxis - required transfusion x 3 this admit for hgb of 6.4. Hgb 9.1 today.  - now w/ RUE DVT and will need long term a/c. Consider switch to Eliquis with lower risk of bleeding  - also requiring Plavix for CAD w/ recent PCI to RCA, LAD and LCx 3/21  - Can stop Plavix if DOAC restarted.  10. Rt Pleural Effusion  - S/p Rt thoracentesis 8/16 -fluid cultures pending    Length of Stay: [place] Np_C  09/05/2019, 9:03 AM  Advanced Heart Failure Team Pager [phone] (M-F; [place])  Please contact [facility] for night-coverage after hours (4p -7a ) and weekends on [email or web address]  Patient seen and examined with the above-signed Advanced Practice Provider and/or Housestaff. I personally reviewed laboratory data, imaging studies and relevant notes. I independently examined the patient and formulated the important aspects of the plan. I have edited the note to reflect any of my changes or salient points. I have personally discussed the plan with the patient and/or family.  Diuresing briskly. Weight down 40 pounds on  IV lasix. BP soft but stable on midodrine. Creatinine stable. On enoxaparin without bleeding   General:  Weak appearing. No resp difficulty HEENT: normal Neck: supple. JVP 8-9 . Carotids 2+ bilat; no bruits. No lymphadenopathy or thryomegaly appreciated. Cor: PMI nondisplaced. Irregular rate & rhythm. No rubs, gallops or murmurs. Lungs: dull at bases Abdomen: soft, nontender, nondistended. No hepatosplenomegaly. No bruits or  masses. Good bowel sounds. Extremities: no cyanosis, clubbing, rash, tr-1+ edema Neuro: alert & orientedx3, cranial nerves grossly intact. moves all 4 extremities w/o difficulty. Affect pleasant  Volume status much improved. Can switch to po diuretics. Remains in rate-controlled AF. Tolerating enoxaparin without bleeding.   Glori Bickers, MD  10:40 PM

## 2019-09-05 NOTE — Progress Notes (Signed)
PROGRESS NOTE    Tony Saunders  JKK:[id number] DOB: 13-Jan-1956 DOA: 08/28/2019 PCP: [name], [name], MD    Brief Narrative:  Tony Saunders is a 64 year old male with past medical history notable for severe mitral regurgitation, seizure disorder, paroxysmal atrial fibrillation, MI, diffuse large B-cell lymphoma, chronic systolic congestive heart failure, CAD who presented to the emergency department with complaints of right hand pain and swelling.  He was found to have a DVT in the right upper extremity and neutropenic fever.  He was also markedly volume overloaded.  Patient referred for admission by EDP for further evaluation and treatment.   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  Multiple admissions for CHF exacerbations, most recently discharged in July with weight 172lbs. now presents with weight up to 200lbs and requiring supplemental oxygen to maintain SPO2.  TTE June 2021 with LVEF 35-40%. --Cardiology/heart failure service following, appreciate assistance --net negative 3.4L past 24h and negative 9.7L since admission --Wt 206>166lbs --On midodrine 10 mg p.o. 3 times daily for blood pressure support --Continue furosemide 80 mg IV twice daily --Continue spironolactone 12.5 mg p.o. twice daily --Continue sotalol oxygen, maintain SPO2 greater than 92%, on 4 L nasal cannula with SPO2 98% --BiPAP prn  Right pleural effusion Underwent right thoracentesis by PCCM on 09/03/2019. 2.8L of clear yellow fluid drained from right  pleural space.  Etiology likely secondary to decompensated congestive heart failure as above --PCCM following, appreciate assistance --Pleural fluid culture with no organisms seen, awaiting finalized culture --Antibiotics have been discontinued --Continue supplemental oxygen, titrate to maintain SPO2 greater than 92%, currently on 4 L nasal cannula today.  Neutropenic fever: Resolved Hx diffuse large B-cell lymphoma Newly diagnosed June 2021, CT with extensive retroperitoneal, pelvic and inguinal lymphadenopathy also involvement of the spleen and near complete infiltration of the right kidney; with possible occlusion of the right renal vein and suspicious for infiltrating disease of the right iliac and left pubic bones.  Pathology consistent with monoclonal B-cell population with coexpression of CD10 comprising 65% of all lymphocytes.  10 temperature of 101 at home with a lactic acid of 2.2.  Initially started on empiric antibiotics with vancomycin and cefepime.  Chest x-ray is negative for pneumonia with unrevealing urinalysis.  Urine blood cultures no growth.  Fevers have resolved with normalization of lactic acid --Follow-up with medical oncology, Dr. Alen Blew outpatient --Currently on chemotherapy/XRT --Was initially started on broad-spectrum antibiotics with vancomycin and cefepime which was discontinued on 08/31/2019.  Right upper extremity DVT Upper extremity venous duplex 08/29/2019 + for DVT, CT angio chest negative for pulmonary embolism.  Etiology likely secondary to underlying malignancy and hypercoagulable state.  Patient was previously on Xarelto but was stopped in June 2021 due to anemia and epistasis.  It appears that both Eliquis and Xarelto are cost prohibitive with recommendations of Lovenox injections per discussion with oncology, Dr. Alen Blew. --Social work for medication assistance for [employer] --Continue Lovenox 80 mg subcutaneously every 12 hours  Persistent atrial flutter Has  failed multiple attempts at [place] in the past.  Currently rate controlled. --Continue  amiodarone 200 mg p.o. daily --Continue digoxin 125 mcg p.o. daily --Currently on Lovenox as above for acute DVT  Hx Ischemic cardiomyopathy CAD Recent MI March 2021 with LAD stent placement. --Continue Plavix  History of seizure disorder: Continue Keppra 750 mg p.o. twice daily  Hypokalemia Hypomagnesemia Repleted during hospitalization.  Potassium 3.3 this morning, will again replete. --Repeat electrolytes in a.m. to include magnesium  Anemia of chronic disease Hemoglobin trended down to 6.4 the day following admission.  Patient was transfused 2 units PRBC on 08/30/2019 and 1 unit on 09/02/2019.  FOBT negative. --Hgb 7.5>6.4>7.8>7.4>9.1>9.5>8.8 --Follow hemoglobin closely daily  GERD: Continue Protonix 40 mg p.o. daily  Depression: Continue sertraline 25 mg p.o. daily  Severe mitral regurgitation Cardiology following, consideration of possible MitraClip candidate in the future depending on lymphoma course.  Severe protein calorie malnutrition Nutrition Status: Nutrition Problem: Severe Malnutrition Etiology: chronic illness (diffuse large B cell lymphoma currenly undergoing chemotherapy) Signs/Symptoms: severe fat depletion, severe muscle depletion, edema Interventions: Ensure Enlive (each supplement provides 350kcal and 20 grams of protein), Prostat, Liberalize Diet, Other (Comment) (double protein) --Dietitian following, appreciate assistance --Continue encourage increased oral intake, supplements   DVT prophylaxis: Lovenox Code Status: Full code Family Communication: Updated patient's spouse, [name] via telephone this afternoon  Disposition Plan:  Status is: Inpatient  Remains inpatient appropriate because:Ongoing diagnostic testing needed not appropriate for outpatient work up, Unsafe d/c plan, IV treatments appropriate due to intensity of illness or inability to take PO and  Inpatient level of care appropriate due to severity of illness   Dispo: The patient is from: Home              Anticipated d/c is to: SNF              Anticipated d/c date is: 2 days              Patient currently is not medically stable to d/c.     Consultants:   PCCM  Cardiology  Procedures:   Right thoracentesis  Antimicrobials:   Vancomycin 8/11 - 8/13  Cefepime 8/11 - 8/13   Subjective: Patient seen and examined bedside, resting comfortably.  Dyspnea improving but still requiring supplemental oxygen, now down to 4 L nasal cannula.  Continues with good urine output.  Continues with diffuse weakness and fatigue.  States has next chemotherapy scheduled for Monday.  No other questions or concerns at this time.  Denies headache, no fever/chills/night sweats, no nausea/vomiting/diarrhea, no chest pain, palpitations, no abdominal pain.  No acute events overnight per nursing staff.  Objective: Vitals:   09/05/19 0706 09/05/19 0719 09/05/19 0800 09/05/19 1054  BP:   96/60 107/64  Pulse:   64 65  Resp:   20 (!) 27  Temp:   98.2 F (36.8 C) 97.9 F (36.6 C)  TempSrc:   Oral Oral  SpO2:  100% 99% 98%  Weight: 75.3 kg       Intake/Output Summary (Last 24 hours) at 09/05/2019 1219 Last data filed at 09/05/2019 1054 Gross per 24 hour  Intake --  Output 3800 ml  Net -3800 ml   Filed Weights   09/03/19 0300 09/04/19 0331 09/05/19 0706  Weight: 87 kg 80.1 kg 75.3 kg    Examination:  General exam: Appears calm and comfortable, chronically ill in appearance Respiratory system: Decreased breath sounds bilateral bases, with mild crackles, no wheezing, normal respiratory effort, on 4 L nasal cannula Cardiovascular system: S1 & S2 heard, RRR. No JVD, murmurs, rubs, gallops  or clicks.  Asymmetric lower extremity edema, left greater than right Gastrointestinal system: Abdomen is nondistended, soft and nontender. No organomegaly or masses felt. Normal bowel sounds heard. Central  nervous system: Alert and oriented. No focal neurological deficits. Extremities: Symmetric 5 x 5 power. Skin: No rashes, lesions or ulcers Psychiatry: Judgement and insight appear normal. Mood & affect appropriate.     Data Reviewed: I have personally reviewed following labs and imaging studies  CBC: Recent Labs  Lab 08/30/19 0526 08/30/19 0526 08/31/19 0550 08/31/19 0550 09/01/19 1148 09/02/19 0418 09/03/19 0415 09/04/19 1122 09/05/19 0535  WBC 2.0*   < > 5.9   < > 5.1 6.2 6.2 6.9 6.3  NEUTROABS 1.6*  --  5.8  --   --   --   --   --   --   HGB 6.4*   < > 7.8*   < > 7.7* 7.4* 9.1* 9.5* 8.8*  HCT 21.2*   < > 26.6*   < > 25.1* 25.0* 30.0* 31.3* 29.4*  MCV 95.5   < > 95.0   < > 93.0 94.3 93.2 92.9 94.2  PLT 48*   < > 83*   < > 71* 82* 84* 76* 79*   < > = values in this interval not displayed.   Basic Metabolic Panel: Recent Labs  Lab 08/30/19 0526 08/31/19 0550 09/02/19 0418 09/03/19 0415 09/03/19 1820 09/04/19 1122 09/05/19 0535  NA 134*   < > 138 137 137 137 136  K 3.7   < > 3.2* 3.1* 4.0 3.2* 3.3*  CL 96*   < > 97* 96* 92* 91* 88*  CO2 29   < > 31 32 35* 40* 42*  GLUCOSE 84   < > 82 86 85 113* 85  BUN 17   < > 20 19 22 20 18   CREATININE 0.84   < > 1.19 1.16 1.27* 1.15 1.04  CALCIUM 7.8*   < > 7.5* 7.5* 7.4* 7.4* 7.2*  MG 1.6*  --  1.9 1.8  --   --   --    < > = values in this interval not displayed.   GFR: Estimated Creatinine Clearance: 78.4 mL/min (by C-G formula based on SCr of 1.04 mg/dL). Liver Function Tests: Recent Labs  Lab 08/30/19 0526 09/03/19 1820  AST 12* 27  ALT 13 16  ALKPHOS 77 90  BILITOT 0.8 1.6*  PROT 4.2* 4.1*  ALBUMIN 2.0* 1.9*   No results for input(s): LIPASE, AMYLASE in the last 168 hours. No results for input(s): AMMONIA in the last 168 hours. Coagulation Profile: No results for input(s): INR, PROTIME in the last 168 hours. Cardiac Enzymes: No results for input(s): CKTOTAL, CKMB, CKMBINDEX, TROPONINI in the last 168  hours. BNP (last 3 results) No results for input(s): PROBNP in the last 8760 hours. HbA1C: No results for input(s): HGBA1C in the last 72 hours. CBG: No results for input(s): GLUCAP in the last 168 hours. Lipid Profile: No results for input(s): CHOL, HDL, LDLCALC, TRIG, CHOLHDL, LDLDIRECT in the last 72 hours. Thyroid Function Tests: No results for input(s): TSH, T4TOTAL, FREET4, T3FREE, THYROIDAB in the last 72 hours. Anemia Panel: No results for input(s): VITAMINB12, FOLATE, FERRITIN, TIBC, IRON, RETICCTPCT in the last 72 hours. Sepsis Labs: No results for input(s): PROCALCITON, LATICACIDVEN in the last 168 hours.  Recent Results (from the past 240 hour(s))  Blood culture (routine x 2)     Status: None   Collection Time: 08/28/19 11:00 PM  Specimen: BLOOD LEFT ARM  Result Value Ref Range Status   Specimen Description BLOOD LEFT ARM  Final   Special Requests   Final    BOTTLES DRAWN AEROBIC AND ANAEROBIC Blood Culture adequate volume   Culture   Final    NO GROWTH 5 DAYS Performed at [facility], [address], [place], [place] [address]    Report Status 09/03/2019 FINAL  Final  Blood culture (routine x 2)     Status: None   Collection Time: 08/28/19 11:13 PM   Specimen: BLOOD LEFT HAND  Result Value Ref Range Status   Specimen Description BLOOD LEFT HAND  Final   Special Requests   Final    BOTTLES DRAWN AEROBIC ONLY Blood Culture adequate volume   Culture   Final    NO GROWTH 5 DAYS Performed at [facility], [place] [address] [place], [place] [address]    Report Status 09/03/2019 FINAL  Final  SARS Coronavirus 2 by RT PCR (hospital order, performed in [facility] hospital lab) Nasopharyngeal Nasopharyngeal Swab     Status: None   Collection Time: 08/29/19  1:33 AM   Specimen: Nasopharyngeal Swab  Result Value Ref Range Status   SARS Coronavirus 2 NEGATIVE NEGATIVE Final    Comment: (NOTE) SARS-CoV-2 target nucleic acids are NOT DETECTED.  The  SARS-CoV-2 RNA is generally detectable in upper and lower respiratory specimens during the acute phase of infection. The lowest concentration of SARS-CoV-2 viral copies this assay can detect is 250 copies / mL. A negative result does not preclude SARS-CoV-2 infection and should not be used as the sole basis for treatment or other patient management decisions.  A negative result may occur with improper specimen collection / handling, submission of specimen other than nasopharyngeal swab, presence of viral mutation(s) within the areas targeted by this assay, and inadequate number of viral copies (<250 copies / mL). A negative result must be combined with clinical observations, patient history, and epidemiological information.  Fact Sheet for Patients:   StrictlyIdeas.no  Fact Sheet for Healthcare Providers: BankingDealers.co.za  This test is not yet approved or  cleared by the [place] FDA and has been authorized for detection and/or diagnosis of SARS-CoV-2 by FDA under an Emergency Use Authorization (EUA).  This EUA will remain in effect (meaning this test can be used) for the duration of the COVID-19 declaration under Section 564(b)(1) of the Act, 21 U.S.C. section 360bbb-3(b)(1), unless the authorization is terminated or revoked sooner.  Performed at [facility], [place] [address], [place], [place] [address]   Urine culture     Status: None   Collection Time: 08/29/19  1:36 AM   Specimen: Urine, Random  Result Value Ref Range Status   Specimen Description URINE, RANDOM  Final   Special Requests NONE  Final   Culture   Final    NO GROWTH Performed at [facility], [place] [address], [place], [place] [address]    Report Status 08/29/2019 FINAL  Final  Body fluid culture     Status: None (Preliminary result)   Collection Time: 09/03/19  3:00 PM   Specimen: Fluid  Result Value Ref Range Status   Specimen Description  FLUID PLEURAL RIGHT  Final   Special Requests NONE  Final   Gram Stain   Final    RARE WBC PRESENT, PREDOMINANTLY MONONUCLEAR NO ORGANISMS SEEN    Culture   Final    NO GROWTH 2 DAYS Performed  at [facility], [place] [address], [place], [place] [address]    Report Status PENDING  Incomplete         Radiology Studies: DG CHEST PORT 1 VIEW  Result Date: 09/03/2019 CLINICAL DATA:  Status post thoracentesis. EXAM: PORTABLE CHEST 1 VIEW COMPARISON:  09/03/2019 FINDINGS: Interval near complete evacuation of the right pleural fluid collection. No postprocedural pneumothorax is identified. Persistent underlying emphysematous changes, mild interstitial edema and patchy areas of atelectasis. Small left pleural effusion persists. IMPRESSION: Status post right thoracentesis with near complete evacuation of the right pleural fluid collection. No postprocedural pneumothorax. Electronically Signed   By: Marijo Sanes M.D.   On: 09/03/2019 14:58        Scheduled Meds: . (feeding supplement) PROSource Plus  30 mL Oral BID BM  . sodium chloride   Intravenous Once  . sodium chloride   Intravenous Once  . acidophilus  2 capsule Oral Daily  . amiodarone  200 mg Oral Daily  . budesonide  0.5 mg Nebulization BID  . Chlorhexidine Gluconate Cloth  6 each Topical Daily  . clopidogrel  75 mg Oral Daily  . digoxin  125 mcg Oral Daily  . enoxaparin (LOVENOX) injection  80 mg Subcutaneous Q12H  . feeding supplement (ENSURE ENLIVE)  237 mL Oral TID BM  . furosemide  80 mg Intravenous BID  . guaiFENesin  600 mg Oral BID  . ipratropium-albuterol  3 mL Nebulization BID  . lamoTRIgine  100 mg Oral BID  . levETIRAcetam  750 mg Oral BID  . midodrine  10 mg Oral TID WC  . pantoprazole  40 mg Oral Daily  . potassium chloride  40 mEq Oral Daily  . sertraline  25 mg Oral Daily  . spironolactone  12.5 mg Oral BID   Continuous Infusions: . magnesium sulfate bolus IVPB       LOS: 7 days    Time  spent: 41 minutes spent on chart review, discussion with nursing staff, consultants, updating family and interview/physical exam; more than 50% of that time was spent in counseling and/or coordination of care.    Blayne Garlick J [place] DO [facility] Available via Epic secure chat 7am-7pm After these hours, please refer to coverage provider listed on [email or web address] 09/05/2019, 12:19 PM

## 2019-09-05 NOTE — Telephone Encounter (Signed)
Spoke with [place] [facility]), the patient will be taking Eliquis after discharge. The plan that we have on file pays nothing towards his prescription out of pocket cost.   Started and sent an application for patient assistance to [employer].  Will follow up.

## 2019-09-05 NOTE — Progress Notes (Signed)
Nutrition Follow-up  RD working remotely.  DOCUMENTATION CODES:   Severe malnutrition in context of chronic illness  INTERVENTION:   - Continue with liberalized Regular diet  - Increase Ensure Enlive po to TID, each supplement provides 350 kcal and 20 grams of protein  - Continue ProSource Plus po BID, each supplement provides 100 kcal and 15 grams of protein  - Continue double protein portions TID with meals  NUTRITION DIAGNOSIS:   Severe Malnutrition related to chronic illness (diffuse large B cell lymphoma currenly undergoing chemotherapy) as evidenced by severe fat depletion, severe muscle depletion, edema.  Ongoing  GOAL:   Patient will meet greater than or equal to 90% of their needs  Progressing  MONITOR:   PO intake, Supplement acceptance, Labs, Weight trends, Skin, I & O's  REASON FOR ASSESSMENT:   Malnutrition Screening Tool    ASSESSMENT:   64 year old male who presented on 8/10 with fever. PMH of CAD, CHF, diffuse large B cell lymphoma currently on chemotherapy (last cycle on 08/20/19), severe mitral regurgitation, seizures, atrial fibrillation, MI.  8/16 - s/p thoracentesis of R pleural effusion with 2.8 L removed  Cardiology consulted for diuresis and pt started on IV Lasix. Midodrine added for BP support. Noted pt with significant improvement in respiratory status after thoracentesis on 8/16. Cultures from thoracentesis pending.  Noted plan for pt to d/c to SNF for rehab.  RD was unable to reach pt via phone call to room.  Per [facility] documentation, pt accepting ~50% of ProSource Plus and ~90% of Ensure Enlive supplements. Will increase Ensure Enlive from BID to TID.  Admit weight: 93.6 kg Current weight: 75.3 kg  Suspect weight loss related mostly to diuresis, s/p thoracentesis and negative fluid balance. Will continue to monitor trends. Per RN edema assessment, pt with mild pitting generalized edema, mild pitting edema to RUE, non-pitting edema to  LUE, mild pitting edema to BLE, and moderate pitting perineal edema.  Meal Completion: 0% x 2 meals documented, no meal completions recorded since 8/13  Medications reviewed and include: ProSource Plus 30 ml BID, Ensure Enlive BID, Lasix 80 mg BID, protonix, Klor-con 40 mEq daily and 40 mEq x 2 additional doses, spironolactone  Labs reviewed: potassium 3.3, hemoglobin 8.8, corrected calcium 8.88 (WNL), platelets 79  UOP: 3700 ml x 24 hours I/O's: -10.6 L since admit  Diet Order:   Diet Order            Diet regular Room service appropriate? Yes; Fluid consistency: Thin; Fluid restriction: 1800 mL Fluid  Diet effective now                 EDUCATION NEEDS:   Education needs have been addressed  Skin:  Skin Assessment: Skin Integrity Issues: Unstageable: left buttocks  Last BM:  09/04/19 type 7  Height:   Ht Readings from Last 1 Encounters:  08/20/19 6' (1.829 m)    Weight:   Wt Readings from Last 1 Encounters:  09/05/19 75.3 kg    Ideal Body Weight:  80.9 kg  BMI:  Body mass index is 22.51 kg/m.  Estimated Nutritional Needs:   Kcal:  2200-2400  Protein:  110-130 grams  Fluid:  1.8 L/day    Gaynell Face, MS, RD, LDN Inpatient Clinical Dietitian Please see AMiON for contact information.

## 2019-09-05 NOTE — TOC Progression Note (Signed)
Transition of Care [facility]) - Progression Note    Patient Details  Name: Tony Saunders MRN: [id number] Date of Birth: 1955/06/22  Transition of Care [facility]) CM/SW [place], [place] Phone Number: 09/05/2019, 3:31 PM  Clinical Narrative:     CSW spoke to [place] at [place]. Olin Hauser stated that [facility] is apart of the [facility] network and will approve SNF for 30 days. The only facility contracted with [facility] in the area is [facility]. Olin Hauser stated they will also consider a single contract agreement for an out of network facility.   CSW called [facility] and they stated they are not able to admit new pts due to low staffing.   CSW communicated this info with pt and wife. Pts wife inquired about what if pt is unable to go to snf. CSW informed her that pt may have to go home with [facility] services. Wife stated they have tried to get [facility] in the past but no facility would accept them due to insurances. Wife stated that its frustrating that places that would accept them are low staff and cant or they wont accept due to insurances.   CSW reached out to the following facilities, to inform them of the single contract agreement from [place]- no answer [facility]- reviewing [facility]/ [facility] [employer]- reviewing, limited  availability [facility]- no answer [facility]- no answer  CSW will continue to follow.   Expected Discharge Plan: [place] Barriers to Discharge: Ship broker, Continued Medical Work up  Expected Discharge Plan and Services Expected Discharge Plan: [place] arrangements for the past 2 months: [place] Determinants of Health (SDOH) Interventions    Readmission Risk Interventions Readmission Risk Prevention Plan 07/23/2019  Transportation Screening Complete  Medication Review Press photographer) Complete  HRI or [place] Complete  SW Recovery Care/Counseling Consult Complete  Palliative Care Screening Not [place] Patient Refused  Some recent data might be hidden   Emeterio Reeve, Latanya Presser, [place] Social Worker [phone]

## 2019-09-06 LAB — BASIC METABOLIC PANEL
Anion gap: 7 (ref 5–15)
BUN: 18 mg/dL (ref 8–23)
CO2: 42 mmol/L — ABNORMAL HIGH (ref 22–32)
Calcium: 7.5 mg/dL — ABNORMAL LOW (ref 8.9–10.3)
Chloride: 87 mmol/L — ABNORMAL LOW (ref 98–111)
Creatinine, Ser: 1.12 mg/dL (ref 0.61–1.24)
GFR calc Af Amer: >60 mL/min (ref >60)
GFR calc non Af Amer: >60 mL/min (ref >60)
Glucose, Bld: 90 mg/dL (ref 70–99)
Potassium: 5 mmol/L (ref 3.5–5.1)
Sodium: 136 mmol/L (ref 135–145)

## 2019-09-06 LAB — CBC
HCT: 29.2 % — ABNORMAL LOW (ref 39.0–52.0)
Hemoglobin: 8.5 g/dL — ABNORMAL LOW (ref 13.0–17.0)
MCH: 27.7 pg (ref 26.0–34.0)
MCHC: 29.1 g/dL — ABNORMAL LOW (ref 30.0–36.0)
MCV: 95.1 fL (ref 80.0–100.0)
Platelets: 78 10*3/uL — ABNORMAL LOW (ref 150–400)
RBC: 3.07 MIL/uL — ABNORMAL LOW (ref 4.22–5.81)
RDW: 18.1 % — ABNORMAL HIGH (ref 11.5–15.5)
WBC: 8.1 10*3/uL (ref 4.0–10.5)
nRBC: 0 % (ref 0.0–0.2)

## 2019-09-06 LAB — MAGNESIUM: Magnesium: 1.7 mg/dL (ref 1.7–2.4)

## 2019-09-06 MED ORDER — APIXABAN 5 MG PO TABS: 5.0000 mg | ORAL_TABLET | Freq: Two times a day (BID) | ORAL | Status: DC

## 2019-09-06 MED ORDER — APIXABAN 5 MG PO TABS
10.0000 mg | ORAL_TABLET | Freq: Two times a day (BID) | ORAL | Status: DC
  Administered 2019-09-06: 10 mg via ORAL
  Filled 2019-09-06: qty 2

## 2019-09-06 MED ORDER — ENOXAPARIN SODIUM 80 MG/0.8ML ~~LOC~~ SOLN: 80.0000 mg | Freq: Two times a day (BID) | SUBCUTANEOUS | Status: DC

## 2019-09-06 NOTE — Progress Notes (Signed)
Orthopedic Tech Progress Note Patient Details:  Tony Saunders 1955-06-20 [id number] Spoke with RN no unna boots Patient ID: Tony Saunders, male   DOB: 1955-10-09, 64 y.o.   MRN: [id number]   Tony Saunders 09/06/2019, 4:32 PM

## 2019-09-06 NOTE — Progress Notes (Addendum)
[place] for enoxaparin Indication: DVT  Patient Measurements: Weight 88.5 kg (195 lb 3 oz) Height 6' (182.9 cm)  Vital Signs: Temp: 97.7 F (36.5 C) (08/19 0750) Temp Source: Oral (08/19 0750) BP: 96/62 (08/19 0750) Pulse Rate: 61 (08/19 0750)  Labs: Recent Labs    09/04/19 1122 09/04/19 1122 09/05/19 0535 09/06/19 0505  HGB 9.5*   < > 8.8* 8.5*  HCT 31.3*  --  29.4* 29.2*  PLT 76*  --  79* 78*  CREATININE 1.15  --  1.04 1.12   < > = values in this interval not displayed.    Medications:  Scheduled:  . (feeding supplement) PROSource Plus  30 mL Oral BID BM  . sodium chloride   Intravenous Once  . sodium chloride   Intravenous Once  . acidophilus  2 capsule Oral Daily  . amiodarone  200 mg Oral Daily  . budesonide  0.5 mg Nebulization BID  . Chlorhexidine Gluconate Cloth  6 each Topical Daily  . clopidogrel  75 mg Oral Daily  . digoxin  125 mcg Oral Daily  . enoxaparin (LOVENOX) injection  80 mg Subcutaneous Q12H  . feeding supplement (ENSURE ENLIVE)  237 mL Oral TID BM  . furosemide  80 mg Oral Daily  . guaiFENesin  600 mg Oral BID  . ipratropium-albuterol  3 mL Nebulization BID  . lamoTRIgine  100 mg Oral BID  . levETIRAcetam  750 mg Oral BID  . midodrine  10 mg Oral TID WC  . pantoprazole  40 mg Oral Daily  . sertraline  25 mg Oral Daily  . spironolactone  12.5 mg Oral BID     Assessment: 64 yo male with a history of lymphoma presents with shortness of breath and fever. The patient was found to have neutropenia and a fever. The patient has a venous duplex that was found to be positive for acute deep vein thrombosis involving the right subclavian vein, 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 The CTA was negative for pulmonary embolism. PTA the patient is not taking any anticoagulation.  Pharmacy is dosing therapeutic enoxaparin for DVT. There are no signs or symptoms of bleeding documented.  Platelets and hgb are  low but stable.  Pharmacy is working with patient and HF clinic on getting patient assistance for apixaban as outpatient. Would recommend using 30 day free card at discharge the follow up with HF clinic.   Goal of Therapy:  Anti-Xa level 0.6-1 units/ml 4hrs after LMWH dose given Monitor platelets by anticoagulation protocol: Yes   Plan:  Adjust lovenox to 80 mg SQ bid for decreasing weight. Monitor s/sx bleeding  Erin Hearing PharmD., BCPS Clinical Pharmacist 09/06/2019 10:58 AM   [facility] pharmacy phone numbers are listed on [email or web address]

## 2019-09-06 NOTE — TOC Progression Note (Signed)
Transition of Care [facility]) - Progression Note    Patient Details  Name: Tony Saunders MRN: [id number] Date of Birth: Sep 19, 1955  Transition of Care [facility]) CM/SW [place], [place] Phone Number: 09/06/2019, 4:12 PM  Clinical Narrative:     CSW informed pt and wife that [facility] has made an offer. PTs wife Tony Saunders requested time to call facility and visit before making a decision. CSW requested debbie make a decision by tomorrow morning.   CSW called and emailed Olin Hauser at [facility] to inquire about single contract agreement.   Expected Discharge Plan: [place] Barriers to Discharge: Ship broker, Continued Medical Work up  Expected Discharge Plan and Services Expected Discharge Plan: [place] arrangements for the past 2 months: [place] Determinants of Health (SDOH) Interventions    Readmission Risk Interventions Readmission Risk Prevention Plan 07/23/2019  Transportation Screening Complete  Medication Review Press photographer) Complete  HRI or [place] Complete  SW Recovery Care/Counseling Consult Complete  Palliative Care Screening Not [employer] Patient Refused  Some recent data might be hidden   Emeterio Reeve, Latanya Presser, [place] Social Worker [phone]

## 2019-09-06 NOTE — Progress Notes (Addendum)
Advanced Heart Failure Rounding Note   Subjective:    Good diuresis overall, back down to previous dry wt 172 lb. Back on PO diuretics.   BP soft but stable w/ midodrine.   Sitting up in bed. Feels better. No complaints.    Objective:   Weight Range:  Vital Signs:   Temp:  [97.9 F (36.6 C)-98.2 F (36.8 C)] 98 F (36.7 C) (08/19 0337) Pulse Rate:  [58-69] 58 (08/19 0651) Resp:  [18-32] 26 (08/19 0651) BP: (93-116)/(52-68) 99/68 (08/19 0337) SpO2:  [96 %-100 %] 96 % (08/19 0651) Weight:  [78.4 kg] 78.4 kg (08/19 0651) Last BM Date: 09/05/19  Weight change: Filed Weights   09/04/19 0331 09/05/19 0706 09/06/19 0651  Weight: 80.1 kg 75.3 kg 78.4 kg    Intake/Output:   Intake/Output Summary (Last 24 hours) at 09/06/2019 0714 Last data filed at 09/05/2019 2000 Gross per 24 hour  Intake 507 ml  Output 1700 ml  Net -1193 ml     Physical Exam: General:  Weak/ cachectic appearing. No resp difficulty HEENT: normal Neck: supple. JVP 7 cm . Carotids 2+ bilat; no bruits. No lymphadenopathy or thryomegaly appreciated. Cor: PMI nondisplaced. Irregularly irregular rate & rhythm. 2/6 MR murmur at apex Lungs: clear, no wheezing  Abdomen: soft, nontender, nondistended. No hepatosplenomegaly. No bruits or masses. Good bowel sounds. Extremities: no cyanosis, clubbing, rash, trace LEE on the Rt, 1+ ankle edema on the left Neuro: alert & orientedx3, cranial nerves grossly intact. moves all 4 extremities w/o difficulty. Affect pleasant   Telemetry:  Chronic afib, 60s-70s    Labs: Basic Metabolic Panel: Recent Labs  Lab 09/02/19 0418 09/02/19 0418 09/03/19 0415 09/03/19 0415 09/03/19 1820 09/03/19 1820 09/04/19 1122 09/05/19 0535 09/06/19 0505  NA 138   < > 137  --  137  --  137 136 136  K 3.2*   < > 3.1*  --  4.0  --  3.2* 3.3* 5.0  CL 97*   < > 96*  --  92*  --  91* 88* 87*  CO2 31   < > 32  --  35*  --  40* 42* 42*  GLUCOSE 82   < > 86  --  85  --  113* 85 90    BUN 20   < > 19  --  22  --  20 18 18   CREATININE 1.19   < > 1.16  --  1.27*  --  1.15 1.04 1.12  CALCIUM 7.5*   < > 7.5*   < > 7.4*   < > 7.4* 7.2* 7.5*  MG 1.9  --  1.8  --   --   --   --   --  1.7   < > = values in this interval not displayed.    Liver Function Tests: Recent Labs  Lab 09/03/19 1820  AST 27  ALT 16  ALKPHOS 90  BILITOT 1.6*  PROT 4.1*  ALBUMIN 1.9*   No results for input(s): LIPASE, AMYLASE in the last 168 hours. No results for input(s): AMMONIA in the last 168 hours.  CBC: Recent Labs  Lab 08/31/19 0550 09/01/19 1148 09/02/19 0418 09/03/19 0415 09/04/19 1122 09/05/19 0535 09/06/19 0505  WBC 5.9   < > 6.2 6.2 6.9 6.3 8.1  NEUTROABS 5.8  --   --   --   --   --   --   HGB 7.8*   < > 7.4* 9.1* 9.5* 8.8*  8.5*  HCT 26.6*   < > 25.0* 30.0* 31.3* 29.4* 29.2*  MCV 95.0   < > 94.3 93.2 92.9 94.2 95.1  PLT 83*   < > 82* 84* 76* 79* 78*   < > = values in this interval not displayed.    Cardiac Enzymes: No results for input(s): CKTOTAL, CKMB, CKMBINDEX, TROPONINI in the last 168 hours.  BNP: BNP (last 3 results) Recent Labs    07/13/19 0200 07/14/19 0500 07/16/19 0629  BNP 608.0* 608.8* 675.9*    ProBNP (last 3 results) No results for input(s): PROBNP in the last 8760 hours.    Other results:  Imaging: No results found.   Medications:     Scheduled Medications: . (feeding supplement) PROSource Plus  30 mL Oral BID BM  . sodium chloride   Intravenous Once  . sodium chloride   Intravenous Once  . acidophilus  2 capsule Oral Daily  . amiodarone  200 mg Oral Daily  . budesonide  0.5 mg Nebulization BID  . Chlorhexidine Gluconate Cloth  6 each Topical Daily  . clopidogrel  75 mg Oral Daily  . digoxin  125 mcg Oral Daily  . enoxaparin (LOVENOX) injection  80 mg Subcutaneous Q12H  . feeding supplement (ENSURE ENLIVE)  237 mL Oral TID BM  . furosemide  80 mg Oral Daily  . guaiFENesin  600 mg Oral BID  . ipratropium-albuterol  3 mL  Nebulization BID  . lamoTRIgine  100 mg Oral BID  . levETIRAcetam  750 mg Oral BID  . midodrine  10 mg Oral TID WC  . pantoprazole  40 mg Oral Daily  . sertraline  25 mg Oral Daily  . spironolactone  12.5 mg Oral BID    Infusions: . magnesium sulfate bolus IVPB      PRN Medications: acetaminophen **OR** acetaminophen, loperamide, morphine injection, oxyCODONE, oxymetazoline, polyethylene glycol, prochlorperazine   Assessment/Plan:   1. Acute on Chronic Systolic Heart Failure - 2/2 ICM, recent MI 3/21 w/ high grade LAD, LCx and RCA lesions, all treated w/ PCI +DES - Echo 6/21 EF 35-40%. RV normal. Severe MR  - multiple recent admissions for a/c CHF c/b low output, required milrinone + levophed last admit 6-7/21 (d/c wt 172 lb) - Recurrent a/c CHF + volume overload.  - doubt low output HF. Suspect low BP is secondary to intravascular volume depletion w/ hypoalbuminemia contributing (albumin 2.0)  - Overall weight down 40 pounds, back to previous dry wt of 172 lb.   - Continue PO diuretics, Lasix 80 mg daily  - Continue Spiro 12.5 mg bid - Continue Midodrine 10 mg tid.  - Continue digoxin 0.125. Check dig level in AM  - Renal function stable.  - Place TED hoses for edema   2. Acute RUE DVT - UE Venous doppler 8/11 + for DVT - Chest CT negative for PE - in the setting of malignancy/ hypercoagulable state - now on Lovenox, management per primary team. They will d/w Dr. Alen Blew  - hgb stable at 8.5 - Plan switch to Eliqus 5 mg bid at d/c. Personally discussed with pharmacy.  - He would d/c with 30 day free card and set up patient assistance.   3. Persistent Atrial Flutter - failed DC-CVseveral times recently - Rate controlled.   - Anticoagulation plan for eliquis.   5. Severe MR - felt to be ischemic MR - May be possible MitraClip candidate depending on lymphoma course  6. High Grade Lymphoma - diagnosed 6/21.  -  CT w/ extensive retroperitoneal, pelvic, and inguinal  lymphadenopathy. There is also involvement of the spleen and near complete infiltration of the right kidney. 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 Suspicious for infiltrating disease of the right iliac and left pubic bones.  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 Alen Blew. Most recent f/u 8/2. Notation regarding prognosis, "Prognosis: Aggressive therapy remains warranted at this time. His disease remains curable potentially if he achieves a complete response upon completing chemotherapy".   7. Neutropenic fever - on chemotherapy for lymphoma - WBC 1.2 on admit, mTemp of 101 at home. Lactic acid 2.2 on admit - treated w/ empiric abx w/ vanc + cefepime  - CXR - for PNA. UA -  - Urine and Blood Cultures NGTD - Resolved.  - now off abx   8. CAD:  - s/p pRCA PCI in 2006 - s/p recent MI with multiple stents in 3/21 (RCA x 2, LCX x 3, LAD x 1) - did not tolerate statin w/ elevated LFTs - not on ? blocker w/ recent low output HF / hypotension  - had not been on ASA given Plavix + Xarelto use (discontinued 7/21)  9. Bleeding - Xarelto was discontinued 7/21 due to hemoptysis and epistaxis - required transfusion x 3 this admit for hgb of 6.4. Hgb 9.1 today.  - now w/ RUE DVT and will need long term a/c. Consider switch to Eliquis with lower risk of bleeding  - also requiring Plavix for CAD w/ recent PCI to RCA, LAD and LCx 3/21  - Can stop Plavix if DOAC restarted.  10. Rt Pleural Effusion  - S/p Rt thoracentesis 8/16 -fluid cultures NGTD   Dispo: SW assisting w/ SNF placement    Length of Stay: [place] PA-C 09/06/2019, 7:14 AM  Advanced Heart Failure Team Pager [phone] (M-F; 7a - 4p)  Please contact [facility] for night-coverage after hours (4p -7a ) and weekends on [email or web address]  Patient seen and examined with the above-signed Advanced Practice  Provider and/or Housestaff. I personally reviewed laboratory data, imaging studies and relevant notes. I independently examined the patient and formulated the important aspects of the plan. I have edited the note to reflect any of my changes or salient points. I have personally discussed the plan with the patient and/or family.  Now back on po lasix. I/O negative 1.1L. breathing ok. Renal function stable  General:  Weak appearing. No resp difficulty HEENT: normal Neck: supple. JVP 7-8Carotids 2+ bilat; no bruits. No lymphadenopathy or thryomegaly appreciated. Cor: PMI nondisplaced. Irregular rate & rhythm. No rubs, gallops or murmurs. Lungs: clear decreased at bases Abdomen: soft, nontender, nondistended. No hepatosplenomegaly. No bruits or masses. Good bowel sounds. Extremities: no cyanosis, clubbing, rash, 1+ edema in thighs Neuro: alert & orientedx3, cranial nerves grossly intact. moves all 4 extremities w/o difficulty. Affect pleasant  Volume status improved but still with some residual edema. Continue lasix 80 mg daily. Can switch to Eliquis and stop Plavix when ready.   Glori Bickers, MD  3:03 PM

## 2019-09-06 NOTE — Progress Notes (Signed)
PROGRESS NOTE    Tony Saunders  GGE:[id number] DOB: [birth year] DOA: 08/28/2019 PCP: [name], [name], MD    Brief Narrative:  Tony Saunders is a 64 year old male with past medical history notable for severe mitral regurgitation, seizure disorder, paroxysmal atrial fibrillation, MI, diffuse large B-cell lymphoma, chronic systolic congestive heart failure, CAD who presented to the emergency department with complaints of right hand pain and swelling.  He was found to have a DVT in the right upper extremity and neutropenic fever.  He was also markedly volume overloaded.  Patient referred for admission by EDP for further evaluation and treatment.   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  Multiple admissions for CHF exacerbations, most recently discharged in July with weight 172lbs. now presents with weight up to 200lbs and requiring supplemental oxygen to maintain SPO2.  TTE June 2021 with LVEF 35-40%. --Cardiology/heart failure service following, appreciate assistance --net negative 1.2L past 24h and negative 10.9L since admission --Wt 206>172lbs --On midodrine 10 mg p.o. 3 times daily for blood pressure support --Continue furosemide 80 mg PO daily --Continue spironolactone 12.5 mg p.o. twice daily --Continue supplemental oxygen, maintain SPO2 greater than 92%, on 3 L nasal cannula with SPO2 97% --BiPAP prn  Right pleural effusion Underwent right thoracentesis by PCCM on 09/03/2019. 2.8L of clear yellow fluid drained from right  pleural space.  Etiology likely secondary to decompensated congestive heart failure versus malignancy.  --PCCM following, appreciate assistance --Pleural fluid culture with no organisms seen, awaiting finalized culture --Antibiotics have been discontinued --Continue supplemental oxygen, titrate to maintain SPO2 greater than 92%, currently on 3 L nasal cannula today.  Neutropenic fever: Resolved Hx diffuse large B-cell lymphoma Newly diagnosed June 2021, CT with extensive retroperitoneal, pelvic and inguinal lymphadenopathy also involvement of the spleen and near complete infiltration of the right kidney; with possible occlusion of the right renal vein and suspicious for infiltrating disease of the right iliac and left pubic bones.  Pathology consistent with monoclonal B-cell population with coexpression of CD10 comprising 65% of all lymphocytes.  10 temperature of 101 at home with a lactic acid of 2.2.  Initially started on empiric antibiotics with vancomycin and cefepime.  Chest x-ray is negative for pneumonia with unrevealing urinalysis.  Urine blood cultures no growth.  Fevers have resolved with normalization of lactic acid --Follow-up with medical oncology, Dr. Alen Blew outpatient --Currently on chemotherapy/XRT --Was initially started on broad-spectrum antibiotics with vancomycin and cefepime which was discontinued on 08/31/2019.  Right upper extremity DVT Upper extremity venous duplex 08/29/2019 + for DVT, CT angio chest negative for pulmonary embolism.  Etiology likely secondary to underlying malignancy and hypercoagulable state.  Patient was previously on Xarelto but was stopped in June 2021 due to anemia and epistasis.  It appears that both Eliquis and Xarelto are cost prohibitive with recommendations of Lovenox injections per discussion with oncology, Dr. Alen Blew. --Continue Lovenox 80 mg subcutaneously every 12 hours; with plan to discharge on Eliquis 5 mg p.o. twice daily  Persistent atrial  flutter Has failed multiple attempts at [place] in the past.  Currently rate controlled. --Continue amiodarone 200 mg p.o. daily --Continue digoxin 125 mcg p.o. daily --Currently on Lovenox as above for acute DVT  Hx Ischemic cardiomyopathy CAD Recent MI March 2021 with LAD stent placement. --Continue Plavix  History of seizure disorder: Continue Keppra 750 mg p.o. twice daily  Hypokalemia Hypomagnesemia Repleted during hospitalization.  Potassium 5.0 this morning --will discontinue scheduled potassium supplement --Repeat electrolytes in a.m. to include magnesium  Anemia of chronic disease Hemoglobin trended down to 6.4 the day following admission.  Patient was transfused 2 units PRBC on 08/30/2019 and 1 unit on 09/02/2019.  FOBT negative. --Hgb 7.5>6.4>7.8>7.4>9.1>9.5>8.8>8.5 --Follow hemoglobin closely daily  GERD: Continue Protonix 40 mg p.o. daily  Depression: Continue sertraline 25 mg p.o. daily  Severe mitral regurgitation Cardiology following, consideration of possible MitraClip candidate in the future depending on lymphoma course.  Severe protein calorie malnutrition Nutrition Status: Nutrition Problem: Severe Malnutrition Etiology: chronic illness (diffuse large B cell lymphoma currenly undergoing chemotherapy) Signs/Symptoms: severe fat depletion, severe muscle depletion, edema Interventions: Ensure Enlive (each supplement provides 350kcal and 20 grams of protein), Prostat, Liberalize Diet, Other (Comment) (double protein) --Dietitian following, appreciate assistance --Continue encourage increased oral intake, supplements   DVT prophylaxis: Lovenox Code Status: Full code Family Communication: Updated patient's spouse, [name] via telephone this afternoon  Disposition Plan:  Status is: Inpatient  Remains inpatient appropriate because:Unsafe d/c plan and Inpatient level of care appropriate due to severity of illness continues to require supplemental oxygen, now down  to 3 L per nasal cannula. Awaiting SNF placement.   Dispo: The patient is from: Home              Anticipated d/c is to: SNF              Anticipated d/c date is: 1 day              Patient currently is not medically stable to d/c.    Consultants:   PCCM  Cardiology  Procedures:   Right thoracentesis  Antimicrobials:   Vancomycin 8/11 - 8/13  Cefepime 8/11 - 8/13   Subjective: Patient seen and examined bedside, resting comfortably.  Dyspnea improving but still requiring supplemental oxygen, now down to 3 L nasal cannula.  Continues with good urine output.  Continues with global weakness and fatigue.  No other questions or concerns at this time.  Denies headache, no fever/chills/night sweats, no nausea/vomiting/diarrhea, no chest pain, palpitations, no abdominal pain.  No acute events overnight per nursing staff.  Objective: Vitals:   09/06/19 0400 09/06/19 0651 09/06/19 0710 09/06/19 0750  BP:    96/62  Pulse:  (!) 58 75 61  Resp: 20 (!) 26 18 20   Temp:    97.7 F (36.5 C)  TempSrc:    Oral  SpO2:  96% 98% 100%  Weight:  78.4 kg      Intake/Output Summary (Last 24 hours) at 09/06/2019 1059 Last data filed at 09/06/2019 0754 Gross per 24 hour  Intake 750 ml  Output 800 ml  Net -50 ml   Filed Weights   09/04/19 0331 09/05/19 0706 09/06/19 0651  Weight: 80.1 kg 75.3 kg 78.4 kg    Examination:  General exam: Appears calm and comfortable, chronically ill in appearance Respiratory system: Decreased breath sounds bilateral bases, with mild crackles, no wheezing, normal respiratory effort, on 4 L nasal cannula Cardiovascular system: S1 & S2 heard, RRR. No JVD, murmurs, rubs, gallops or clicks.  Asymmetric lower extremity edema, left greater than right  Gastrointestinal system: Abdomen is nondistended, soft and nontender. No organomegaly or masses felt. Normal bowel sounds heard. Central nervous system: Alert and oriented. No focal neurological deficits. Extremities:  Symmetric 5 x 5 power. Skin: No rashes, lesions or ulcers Psychiatry: Judgement and insight appear normal. Mood & affect appropriate.     Data Reviewed: I have personally reviewed following labs and imaging studies  CBC: Recent Labs  Lab 08/31/19 0550 09/01/19 1148 09/02/19 0418 09/03/19 0415 09/04/19 1122 09/05/19 0535 09/06/19 0505  WBC 5.9   < > 6.2 6.2 6.9 6.3 8.1  NEUTROABS 5.8  --   --   --   --   --   --   HGB 7.8*   < > 7.4* 9.1* 9.5* 8.8* 8.5*  HCT 26.6*   < > 25.0* 30.0* 31.3* 29.4* 29.2*  MCV 95.0   < > 94.3 93.2 92.9 94.2 95.1  PLT 83*   < > 82* 84* 76* 79* 78*   < > = values in this interval not displayed.   Basic Metabolic Panel: Recent Labs  Lab 09/02/19 0418 09/02/19 0418 09/03/19 0415 09/03/19 1820 09/04/19 1122 09/05/19 0535 09/06/19 0505  NA 138   < > 137 137 137 136 136  K 3.2*   < > 3.1* 4.0 3.2* 3.3* 5.0  CL 97*   < > 96* 92* 91* 88* 87*  CO2 31   < > 32 35* 40* 42* 42*  GLUCOSE 82   < > 86 85 113* 85 90  BUN 20   < > 19 22 20 18 18   CREATININE 1.19   < > 1.16 1.27* 1.15 1.04 1.12  CALCIUM 7.5*   < > 7.5* 7.4* 7.4* 7.2* 7.5*  MG 1.9  --  1.8  --   --   --  1.7   < > = values in this interval not displayed.   GFR: Estimated Creatinine Clearance: 75.1 mL/min (by C-G formula based on SCr of 1.12 mg/dL). Liver Function Tests: Recent Labs  Lab 09/03/19 1820  AST 27  ALT 16  ALKPHOS 90  BILITOT 1.6*  PROT 4.1*  ALBUMIN 1.9*   No results for input(s): LIPASE, AMYLASE in the last 168 hours. No results for input(s): AMMONIA in the last 168 hours. Coagulation Profile: No results for input(s): INR, PROTIME in the last 168 hours. Cardiac Enzymes: No results for input(s): CKTOTAL, CKMB, CKMBINDEX, TROPONINI in the last 168 hours. BNP (last 3 results) No results for input(s): PROBNP in the last 8760 hours. HbA1C: No results for input(s): HGBA1C in the last 72 hours. CBG: No results for input(s): GLUCAP in the last 168 hours. Lipid  Profile: No results for input(s): CHOL, HDL, LDLCALC, TRIG, CHOLHDL, LDLDIRECT in the last 72 hours. Thyroid Function Tests: No results for input(s): TSH, T4TOTAL, FREET4, T3FREE, THYROIDAB in the last 72 hours. Anemia Panel: No results for input(s): VITAMINB12, FOLATE, FERRITIN, TIBC, IRON, RETICCTPCT in the last 72 hours. Sepsis Labs: No results for input(s): PROCALCITON, LATICACIDVEN in the last 168 hours.  Recent Results (from the past 240 hour(s))  Blood culture (routine x 2)     Status: None   Collection Time: 08/28/19 11:00 PM   Specimen: BLOOD LEFT ARM  Result Value Ref Range Status   Specimen Description BLOOD LEFT ARM  Final   Special Requests   Final    BOTTLES DRAWN AEROBIC AND ANAEROBIC Blood Culture adequate volume   Culture   Final    NO GROWTH 5 DAYS  Performed at [facility], [place] [address], [place], [place] [address]    Report Status 09/03/2019 FINAL  Final  Blood culture (routine x 2)     Status: None   Collection Time: 08/28/19 11:13 PM   Specimen: BLOOD LEFT HAND  Result Value Ref Range Status   Specimen Description BLOOD LEFT HAND  Final   Special Requests   Final    BOTTLES DRAWN AEROBIC ONLY Blood Culture adequate volume   Culture   Final    NO GROWTH 5 DAYS Performed at [facility], [place] [address], [place], [place] [address]    Report Status 09/03/2019 FINAL  Final  SARS Coronavirus 2 by RT PCR (hospital order, performed in [facility] hospital lab) Nasopharyngeal Nasopharyngeal Swab     Status: None   Collection Time: 08/29/19  1:33 AM   Specimen: Nasopharyngeal Swab  Result Value Ref Range Status   SARS Coronavirus 2 NEGATIVE NEGATIVE Final    Comment: (NOTE) SARS-CoV-2 target nucleic acids are NOT DETECTED.  The SARS-CoV-2 RNA is generally detectable in upper and lower respiratory specimens during the acute phase of infection. The lowest concentration of SARS-CoV-2 viral copies this assay can detect is 250 copies / mL. A negative  result does not preclude SARS-CoV-2 infection and should not be used as the sole basis for treatment or other patient management decisions.  A negative result may occur with improper specimen collection / handling, submission of specimen other than nasopharyngeal swab, presence of viral mutation(s) within the areas targeted by this assay, and inadequate number of viral copies (<250 copies / mL). A negative result must be combined with clinical observations, patient history, and epidemiological information.  Fact Sheet for Patients:   StrictlyIdeas.no  Fact Sheet for Healthcare Providers: BankingDealers.co.za  This test is not yet approved or  cleared by the [place] FDA and has been authorized for detection and/or diagnosis of SARS-CoV-2 by FDA under an Emergency Use Authorization (EUA).  This EUA will remain in effect (meaning this test can be used) for the duration of the COVID-19 declaration under Section 564(b)(1) of the Act, 21 U.S.C. section 360bbb-3(b)(1), unless the authorization is terminated or revoked sooner.  Performed at [facility], [place] [address], [place], [place] [address]   Urine culture     Status: None   Collection Time: 08/29/19  1:36 AM   Specimen: Urine, Random  Result Value Ref Range Status   Specimen Description URINE, RANDOM  Final   Special Requests NONE  Final   Culture   Final    NO GROWTH Performed at [facility], [place] [address], [place], [place] [address]    Report Status 08/29/2019 FINAL  Final  Body fluid culture     Status: None (Preliminary result)   Collection Time: 09/03/19  3:00 PM   Specimen: Fluid  Result Value Ref Range Status   Specimen Description FLUID PLEURAL RIGHT  Final   Special Requests NONE  Final   Gram Stain   Final    RARE WBC PRESENT, PREDOMINANTLY MONONUCLEAR NO ORGANISMS SEEN    Culture   Final    NO GROWTH 3 DAYS Performed at [facility], [place] [address], [place], [place] [address]    Report Status PENDING  Incomplete         Radiology Studies: No results found.      Scheduled Meds:  (feeding supplement) PROSource Plus  30 mL Oral BID BM  sodium chloride   Intravenous Once   sodium chloride   Intravenous Once   acidophilus  2 capsule Oral Daily   amiodarone  200 mg Oral Daily   budesonide  0.5 mg Nebulization BID   Chlorhexidine Gluconate Cloth  6 each Topical Daily   clopidogrel  75 mg Oral Daily   digoxin  125 mcg Oral Daily   enoxaparin (LOVENOX) injection  80 mg Subcutaneous Q12H   feeding supplement (ENSURE ENLIVE)  237 mL Oral TID BM   furosemide  80 mg Oral Daily   guaiFENesin  600 mg Oral BID   ipratropium-albuterol  3 mL Nebulization BID   lamoTRIgine  100 mg Oral BID   levETIRAcetam  750 mg Oral BID   midodrine  10 mg Oral TID WC   pantoprazole  40 mg Oral Daily   sertraline  25 mg Oral Daily   spironolactone  12.5 mg Oral BID   Continuous Infusions:  magnesium sulfate bolus IVPB       LOS: 8 days    Time spent: 38 minutes spent on chart review, discussion with nursing staff, consultants, updating family and interview/physical exam; more than 50% of that time was spent in counseling and/or coordination of care.    Salik Grewell J [place] DO [facility] Available via Epic secure chat 7am-7pm After these hours, please refer to coverage provider listed on [email or web address] 09/06/2019, 10:59 AM

## 2019-09-06 NOTE — Plan of Care (Signed)
  Problem: Education: Goal: Knowledge of General Education information will improve Description: Including pain rating scale, medication(s)/side effects and non-pharmacologic comfort measures Outcome: Progressing   Problem: Clinical Measurements: Goal: Will remain free from infection Outcome: Progressing Goal: Diagnostic test results will improve Outcome: Progressing Goal: Cardiovascular complication will be avoided Outcome: Progressing   Problem: Coping: Goal: Level of anxiety will decrease Outcome: Progressing   

## 2019-09-06 NOTE — Progress Notes (Signed)
Physical Therapy Treatment Patient Details Name: Tony Saunders MRN: [id number] DOB: 25-Mar-1955 Today's Date: 09/06/2019    History of Present Illness Tony Saunders is an 64 y.o. male with history of severe mitral regurgitation, seizure, paroxysmal atrial fibrillation, myocardial infarction, diffuse large B-cell lymphoma, CHF, CAD presents to the ER with a chief complaint of right hand pain and swelling.  Pt admitted with resp failure, R pleural effusion and s/p thoracentesis on 09/03/19, DVT R UE dx 08/29/19, and neutropenic fever that has now resolved.    PT Comments    Pt demonstrating gradual progress.  He required multimodal cues for transfer techniques and facilitation with bed pad and gait belt.  Pt stood x 4 and took a few steps; however, as he fatigued required increased assistance.  Required cues for slow controlled ROM and full ROM with exercises.  Cont POC.  Continue to recommend SNF.   Had been asked by case management to evaluate DME needs as they were having difficulty with insurance and SNF - pt has necessary DME at this time. However, now appears he may have a SNF bed available.    Follow Up Recommendations  SNF;Supervision/Assistance - 24 hour     Equipment Recommendations  None recommended by PT;Other (comment) (Has DME)    Recommendations for Other Services       Precautions / Restrictions Precautions Precautions: Fall    Mobility  Bed Mobility Overal bed mobility: Needs Assistance Bed Mobility: Supine to Sit     Supine to sit: Min assist;HOB elevated     General bed mobility comments: HOB and use of rail, pt able to advance legs off bed but required min A to scoot to EOB  Transfers Overall transfer level: Needs assistance Equipment used: Rolling walker (2 wheeled) Transfers: Sit to/from Stand Sit to Stand: Mod assist;Max assist;+2 safety/equipment;+2 physical assistance         General transfer comment: Pt required mod A x 2 to stand from  elevated bed.  Required max x 2 to stand from [facility] then from recliner x 2.  Cues for foot and hand placement, use of momentum, and leaning forward.  On last stand from chair, required HHA of 2 to allow therapist to get closer and block knees.  All other attempts with RW  Ambulation/Gait Ambulation/Gait assistance: Min assist;+2 physical assistance;+2 safety/equipment Gait Distance (Feet): 2 Feet (2'x2) Assistive device: Rolling walker (2 wheeled) Gait Pattern/deviations: Step-to pattern;Decreased stride length;Shuffle Gait velocity: decreased   General Gait Details: Steps to bsc then to recliner.  Cues for safety  (sat abruptly when trying to get to bsc), posture, and RW use. Fatigued easily   Marine scientist [place] (Stroke Patients Only)       Balance Overall balance assessment: Needs assistance Sitting-balance support: No upper extremity supported;Feet supported Sitting balance-Leahy Scale: Fair Sitting balance - Comments: PT able to sit EOB without support   Standing balance support: Bilateral upper extremity supported Standing balance-Leahy Scale: Poor Standing balance comment: Required RW or assist of therapist.  Stood 3 times for ~30 seconds                            Cognition Arousal/Alertness: Awake/alert Behavior During Therapy: WFL for tasks assessed/performed Overall Cognitive Status: Within Functional Limits for tasks assessed  Exercises General Exercises - Lower Extremity Ankle Circles/Pumps: AROM;Both;10 reps;Seated Long Arc Quad: AROM;Both;10 reps;Seated Hip Flexion/Marching: AROM;Both;10 reps;Seated    General Comments General comments (skin integrity, edema, etc.): Pt reports has lift chair, w/c, bsc, and RW at home.  W/c and BSC do not have drop arm.  Noted initially, they were have trouble with SNF and insurance, but at wife's arrival she reports  they may have found a bed avaiable at SNF.      Pertinent Vitals/Pain Pain Assessment: No/denies pain    Home Living                      Prior Function            PT Goals (current goals can now be found in the care plan section) Acute Rehab PT Goals Patient Stated Goal: to go home  PT Goal Formulation: With patient/family Time For Goal Achievement: 08/21/2019 Potential to Achieve Goals: Fair Progress towards PT goals: Progressing toward goals    Frequency    Min 3X/week      PT Plan Current plan remains appropriate    Co-evaluation              AM-PAC PT "6 Clicks" Mobility   Outcome Measure  Help needed turning from your back to your side while in a flat bed without using bedrails?: A Little Help needed moving from lying on your back to sitting on the side of a flat bed without using bedrails?: A Lot Help needed moving to and from a bed to a chair (including a wheelchair)?: A Lot Help needed standing up from a chair using your arms (e.g., wheelchair or bedside chair)?: A Lot Help needed to walk in hospital room?: A Lot Help needed climbing 3-5 steps with a railing? : Total 6 Click Score: 12    End of Session Equipment Utilized During Treatment: Gait belt;Oxygen Activity Tolerance: Patient tolerated treatment well Patient left: with chair alarm set;in chair;with family/visitor present;with call bell/phone within reach Nurse Communication: Mobility status;Need for lift equipment;Other (comment) (Blood in BM (left in bsc)) PT Visit Diagnosis: Muscle weakness (generalized) (M62.81);Difficulty in walking, not elsewhere classified (R26.2)     Time: 1526-1600 PT Time Calculation (min) (ACUTE ONLY): 34 min  Charges:  $Therapeutic Exercise: 8-22 mins $Therapeutic Activity: 8-22 mins                     Abran Richard, PT Acute Rehab Services Pager [phone] [facility] [phone]     Karlton Lemon 09/06/2019, 5:46 PM

## 2019-09-06 NOTE — Progress Notes (Signed)
[place] for enoxaparin >> Apixaban Indication: DVT  Patient Measurements: Weight 88.5 kg (195 lb 3 oz) Height 6' (182.9 cm)  Vital Signs: Temp: 97.9 F (36.6 C) (08/19 1500) Temp Source: Oral (08/19 1500) BP: 93/55 (08/19 1500) Pulse Rate: 76 (08/19 1500)  Labs: Recent Labs    09/04/19 1122 09/04/19 1122 09/05/19 0535 09/06/19 0505  HGB 9.5*   < > 8.8* 8.5*  HCT 31.3*  --  29.4* 29.2*  PLT 76*  --  79* 78*  CREATININE 1.15  --  1.04 1.12   < > = values in this interval not displayed.    Medications:  Scheduled:   (feeding supplement) PROSource Plus  30 mL Oral BID BM   sodium chloride   Intravenous Once   sodium chloride   Intravenous Once   acidophilus  2 capsule Oral Daily   amiodarone  200 mg Oral Daily   budesonide  0.5 mg Nebulization BID   Chlorhexidine Gluconate Cloth  6 each Topical Daily   digoxin  125 mcg Oral Daily   enoxaparin (LOVENOX) injection  80 mg Subcutaneous Q12H   feeding supplement (ENSURE ENLIVE)  237 mL Oral TID BM   furosemide  80 mg Oral Daily   guaiFENesin  600 mg Oral BID   ipratropium-albuterol  3 mL Nebulization BID   lamoTRIgine  100 mg Oral BID   levETIRAcetam  750 mg Oral BID   midodrine  10 mg Oral TID WC   pantoprazole  40 mg Oral Daily   sertraline  25 mg Oral Daily   spironolactone  12.5 mg Oral BID     Assessment: 64 yo male with a history of lymphoma presents with shortness of breath and fever. The patient was found to have neutropenia and a fever. The patient has a venous duplex that was found to be positive for acute deep vein thrombosis involving the right subclavian vein, 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 The CTA was negative for pulmonary embolism. PTA the patient is not taking any anticoagulation.  Patient has been on therapeutic subcutaneous Enoxaparin therapy with no signs or symptoms of bleeding. Pharmacy has been consulted to transition  Enoxaparin therapy to oral Apixaban therapy.   Last dose of Enoxaparin was given at 0500 AM.  Will plan to start Apixaban therapy now as >12 hours since last dose.  Patient has been on Enoxaparin since 8/11 but has missed several doses so will go ahead with high dose for 7 days.  CBC is low but stable. Low platelets noted but staying stable in 70s. Plavix therapy has been stopped.   Goal of Therapy:  Anti-Xa level 0.6-1 units/ml 4hrs after LMWH dose given Monitor platelets by anticoagulation protocol: Yes   Plan:  Discontinue Lovenox therapy.  Start Apixaban 10mg  po BID x7 days, then on 09/14/19 reduce to 5mg  po BID.  Monitor s/sx bleeding  Sloan Leiter, PharmD, BCPS, BCCCP Clinical Pharmacist Please refer to [facility] for [place] numbers 09/06/2019 5:14 PM   [facility] pharmacy phone numbers are listed on [email or web address]

## 2019-09-07 DIAGNOSIS — I4892 Unspecified atrial flutter: Secondary | ICD-10-CM

## 2019-09-07 LAB — BODY FLUID CULTURE: Culture: NO GROWTH

## 2019-09-07 LAB — BASIC METABOLIC PANEL
Anion gap: 8 (ref 5–15)
BUN: 21 mg/dL (ref 8–23)
CO2: 38 mmol/L — ABNORMAL HIGH (ref 22–32)
Calcium: 7.5 mg/dL — ABNORMAL LOW (ref 8.9–10.3)
Chloride: 87 mmol/L — ABNORMAL LOW (ref 98–111)
Creatinine, Ser: 0.99 mg/dL (ref 0.61–1.24)
GFR calc Af Amer: >60 mL/min (ref >60)
GFR calc non Af Amer: >60 mL/min (ref >60)
Glucose, Bld: 105 mg/dL — ABNORMAL HIGH (ref 70–99)
Potassium: 4.1 mmol/L (ref 3.5–5.1)
Sodium: 133 mmol/L — ABNORMAL LOW (ref 135–145)

## 2019-09-07 LAB — MAGNESIUM: Magnesium: 1.6 mg/dL — ABNORMAL LOW (ref 1.7–2.4)

## 2019-09-07 LAB — DIGOXIN LEVEL: Digoxin Level: 1.7 ng/mL (ref 0.8–2.0)

## 2019-09-07 MED ORDER — MAGNESIUM SULFATE 4 GM/100ML IV SOLN
4.0000 g | Freq: Once | INTRAVENOUS | Status: AC
  Administered 2019-09-07: 4 g via INTRAVENOUS
  Filled 2019-09-07: qty 100

## 2019-09-07 MED ORDER — APIXABAN 5 MG PO TABS
5.0000 mg | ORAL_TABLET | Freq: Two times a day (BID) | ORAL | Status: DC
  Administered 2019-09-07 – 2019-09-09 (×5): 5 mg via ORAL
  Filled 2019-09-07 (×5): qty 1

## 2019-09-07 MED ORDER — MAGNESIUM SULFATE 2 GM/50ML IV SOLN
2.0000 g | Freq: Once | INTRAVENOUS | Status: DC
  Filled 2019-09-07: qty 50

## 2019-09-07 MED FILL — Fosaprepitant Dimeglumine For IV Infusion 150 MG (Base Eq): INTRAVENOUS | Qty: 5 | Status: AC

## 2019-09-07 MED FILL — Dexamethasone Sodium Phosphate Inj 100 MG/10ML: INTRAMUSCULAR | Qty: 1 | Status: AC

## 2019-09-07 NOTE — TOC Progression Note (Signed)
Transition of Care [facility]) - Progression Note    Patient Details  Name: Tony Saunders MRN: [id number] Date of Birth: Sep 26, 1955  Transition of Care [facility]) CM/SW [place], [place] Phone Number: 09/07/2019, 8:48 AM  Clinical Narrative:    CSW spoke to pts wife Tony Saunders via phone. Tony Saunders stated that she talked it over with the pt and their son. Tony Saunders stated that they have decided to return home and continue to provide his care.   Tony Saunders requests [facility] services, but stated in the past they where unable to receive services due to insurance and staff shortages.    Expected Discharge Plan: [place] Barriers to Discharge: Ship broker, Continued Medical Work up  Expected Discharge Plan and Services Expected Discharge Plan: [place] arrangements for the past 2 months: Meta Determinants of Health (SDOH) Interventions    Readmission Risk Interventions Readmission Risk Prevention Plan 07/23/2019  Transportation Screening Complete  Medication Review Press photographer) Complete  HRI or [place] Complete  SW Recovery Care/Counseling Consult Complete  Palliative Care Screening Not [place] Patient Refused  Some recent data might be hidden   Emeterio Reeve, Latanya Presser, [place] Social Worker [phone]

## 2019-09-07 NOTE — Progress Notes (Signed)
[place] for enoxaparin >> Apixaban Indication: DVT  Patient Measurements: Weight 88.5 kg (195 lb 3 oz) Height 6' (182.9 cm)  Vital Signs: Temp: 97.6 F (36.4 C) (08/20 0742) Temp Source: Oral (08/20 0742) BP: 97/63 (08/20 0742) Pulse Rate: 62 (08/20 0742)  Labs: Recent Labs    09/04/19 1122 09/04/19 1122 09/05/19 0535 09/06/19 0505 09/07/19 0227  HGB 9.5*   < > 8.8* 8.5*  --   HCT 31.3*  --  29.4* 29.2*  --   PLT 76*  --  79* 78*  --   CREATININE 1.15   < > 1.04 1.12 0.99   < > = values in this interval not displayed.    Medications:  Scheduled:  . (feeding supplement) PROSource Plus  30 mL Oral BID BM  . sodium chloride   Intravenous Once  . sodium chloride   Intravenous Once  . acidophilus  2 capsule Oral Daily  . amiodarone  200 mg Oral Daily  . apixaban  5 mg Oral BID  . budesonide  0.5 mg Nebulization BID  . Chlorhexidine Gluconate Cloth  6 each Topical Daily  . feeding supplement (ENSURE ENLIVE)  237 mL Oral TID BM  . furosemide  80 mg Oral Daily  . guaiFENesin  600 mg Oral BID  . ipratropium-albuterol  3 mL Nebulization BID  . lamoTRIgine  100 mg Oral BID  . levETIRAcetam  750 mg Oral BID  . midodrine  10 mg Oral TID WC  . pantoprazole  40 mg Oral Daily  . sertraline  25 mg Oral Daily  . spironolactone  12.5 mg Oral BID     Assessment: 64 yo male with a history of lymphoma presents with shortness of breath and fever. The patient was found to have neutropenia and a fever. The patient has a venous duplex that was found to be positive for acute deep vein thrombosis involving the right subclavian vein, 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 The CTA was negative for pulmonary embolism. PTA the patient is not taking any anticoagulation.  Patient has been on therapeutic subcutaneous Enoxaparin therapy with no signs or symptoms of bleeding. Pharmacy has been consulted to transition Enoxaparin therapy to oral  Apixaban therapy.   Last dose of Enoxaparin was given at 0500 AM.  Will plan to start Apixaban therapy now as >12 hours since last dose.  Patient has been on Enoxaparin since 8/11 - has missed a couple of doses, but received all scheduled doses the past few days.  Goal of Therapy:  Anti-Xa level 0.6-1 units/ml 4hrs after LMWH dose given Monitor platelets by anticoagulation protocol: Yes   Plan:  Reduce Eliquis to 5 mg BID.  Monitor s/sx bleeding Will need 30d free Eliquis from [facility], and paperwork for Eliquis patient assistance program has been submitted through HF Clinic pharmacist.  [place] Crane, Vena Austria, BCPS, [facility] Clinical Pharmacist  09/07/2019 10:21 AM   [facility] pharmacy phone numbers are listed on [email or web address]

## 2019-09-07 NOTE — Plan of Care (Signed)
  Problem: Education: Goal: Knowledge of General Education information will improve Description: Including pain rating scale, medication(s)/side effects and non-pharmacologic comfort measures Outcome: Progressing   Problem: Health Behavior/Discharge Planning: Goal: Ability to manage health-related needs will improve Outcome: Progressing   Problem: Clinical Measurements: Goal: Ability to maintain clinical measurements within normal limits will improve Outcome: Progressing Goal: Diagnostic test results will improve Outcome: Progressing   Problem: Coping: Goal: Level of anxiety will decrease Outcome: Progressing   Problem: Elimination: Goal: Will not experience complications related to bowel motility Outcome: Progressing   Problem: Pain Managment: Goal: General experience of comfort will improve Outcome: Progressing

## 2019-09-07 NOTE — Progress Notes (Addendum)
Advanced Heart Failure Rounding Note   Subjective:    Good diuresis overall, was back to previous dry wt of 172 lb yesterday. Wt not charted yet for today.    -1.2L in UOP w/ PO Lasix.   SBPs 90s-low 100s.   Dig level elevated at 1.7 today. SCr 0.99. Mg 4.1. Mg 1.6  He is now on Eliquis for treatment of DVT. Plavix discontinued.   Sitting up in bed. No dyspnea.   Objective:   Weight Range:  Vital Signs:   Temp:  [97.6 F (36.4 C)-99.1 F (37.3 C)] 97.6 F (36.4 C) (08/20 0742) Pulse Rate:  [62-95] 62 (08/20 0742) Resp:  [12-19] 15 (08/20 0742) BP: (93-107)/(51-63) 97/63 (08/20 0742) SpO2:  [96 %-100 %] 97 % (08/20 0827) Last BM Date: 09/05/19  Weight change: Filed Weights   09/04/19 0331 09/05/19 0706 09/06/19 0651  Weight: 80.1 kg 75.3 kg 78.4 kg    Intake/Output:   Intake/Output Summary (Last 24 hours) at 09/07/2019 0913 Last data filed at 09/07/2019 0500 Gross per 24 hour  Intake --  Output 1150 ml  Net -1150 ml     Physical Exam: General:  Frail/ cachectic appearing. No resp difficulty HEENT: normal Neck: supple. JVP ~8 cm . Carotids 2+ bilat; no bruits. No lymphadenopathy or thryomegaly appreciated. Cor: PMI nondisplaced. Irregularly irregular rate & rhythm. 2/6 MR murmur at apex Lungs: clear, no wheezing  Abdomen: soft, nontender, nondistended. No hepatosplenomegaly. No bruits or masses. Good bowel sounds. Extremities: no cyanosis, clubbing, rash, 1+ bilateral  LEE, + TED hoses  Neuro: alert & orientedx3, cranial nerves grossly intact. moves all 4 extremities w/o difficulty. Affect pleasant   Telemetry:  Chronic afib, 60s-70s    Labs: Basic Metabolic Panel: Recent Labs  Lab 09/02/19 0418 09/02/19 0418 09/03/19 0415 09/03/19 0415 09/03/19 1820 09/03/19 1820 09/04/19 1122 09/04/19 1122 09/05/19 0535 09/06/19 0505 09/07/19 0227  NA 138   < > 137   < > 137  --  137  --  136 136 133*  K 3.2*   < > 3.1*   < > 4.0  --  3.2*  --  3.3* 5.0  4.1  CL 97*   < > 96*   < > 92*  --  91*  --  88* 87* 87*  CO2 31   < > 32   < > 35*  --  40*  --  42* 42* 38*  GLUCOSE 82   < > 86   < > 85  --  113*  --  85 90 105*  BUN 20   < > 19   < > 22  --  20  --  18 18 21   CREATININE 1.19   < > 1.16   < > 1.27*  --  1.15  --  1.04 1.12 0.99  CALCIUM 7.5*   < > 7.5*   < > 7.4*   < > 7.4*   < > 7.2* 7.5* 7.5*  MG 1.9  --  1.8  --   --   --   --   --   --  1.7 1.6*   < > = values in this interval not displayed.    Liver Function Tests: Recent Labs  Lab 09/03/19 1820  AST 27  ALT 16  ALKPHOS 90  BILITOT 1.6*  PROT 4.1*  ALBUMIN 1.9*   No results for input(s): LIPASE, AMYLASE in the last 168 hours. No results for input(s): AMMONIA in  the last 168 hours.  CBC: Recent Labs  Lab 09/02/19 0418 09/03/19 0415 09/04/19 1122 09/05/19 0535 09/06/19 0505  WBC 6.2 6.2 6.9 6.3 8.1  HGB 7.4* 9.1* 9.5* 8.8* 8.5*  HCT 25.0* 30.0* 31.3* 29.4* 29.2*  MCV 94.3 93.2 92.9 94.2 95.1  PLT 82* 84* 76* 79* 78*    Cardiac Enzymes: No results for input(s): CKTOTAL, CKMB, CKMBINDEX, TROPONINI in the last 168 hours.  BNP: BNP (last 3 results) Recent Labs    07/13/19 0200 07/14/19 0500 07/16/19 0629  BNP 608.0* 608.8* 675.9*    ProBNP (last 3 results) No results for input(s): PROBNP in the last 8760 hours.    Other results:  Imaging: No results found.   Medications:     Scheduled Medications: . (feeding supplement) PROSource Plus  30 mL Oral BID BM  . sodium chloride   Intravenous Once  . sodium chloride   Intravenous Once  . acidophilus  2 capsule Oral Daily  . amiodarone  200 mg Oral Daily  . apixaban  10 mg Oral BID   Followed by  . [START ON 09/14/2019] apixaban  5 mg Oral BID  . budesonide  0.5 mg Nebulization BID  . Chlorhexidine Gluconate Cloth  6 each Topical Daily  . digoxin  125 mcg Oral Daily  . feeding supplement (ENSURE ENLIVE)  237 mL Oral TID BM  . furosemide  80 mg Oral Daily  . guaiFENesin  600 mg Oral BID  .  ipratropium-albuterol  3 mL Nebulization BID  . lamoTRIgine  100 mg Oral BID  . levETIRAcetam  750 mg Oral BID  . midodrine  10 mg Oral TID WC  . pantoprazole  40 mg Oral Daily  . sertraline  25 mg Oral Daily  . spironolactone  12.5 mg Oral BID    Infusions: . magnesium sulfate bolus IVPB      PRN Medications: acetaminophen **OR** acetaminophen, loperamide, morphine injection, oxyCODONE, oxymetazoline, polyethylene glycol, prochlorperazine   Assessment/Plan:   1. Acute on Chronic Systolic Heart Failure - 2/2 ICM, recent MI 3/21 w/ high grade LAD, LCx and RCA lesions, all treated w/ PCI +DES - Echo 6/21 EF 35-40%. RV normal. Severe MR  - multiple recent admissions for a/c CHF c/b low output, required milrinone + levophed last admit 6-7/21 (d/c wt 172 lb) - Recurrent a/c CHF + volume overload.  - doubt low output HF. Suspect low BP is secondary to intravascular volume depletion w/ hypoalbuminemia contributing (albumin 2.0)  - Overall weight down 40 pounds, back to previous dry wt of 172 lb.   - Continue PO diuretics, Lasix 80 mg daily  - Continue Spiro 12.5 mg bid - Continue Midodrine 10 mg tid.  - On digoxin 0.125. Dig level elevated 1.7 today. Hold dig today and tomorrow, then resume lower dose 0.0625 - Renal function stable.  - Continue TED hoses for edema, encouraged elevation when he returns home    2. Acute RUE DVT - UE Venous doppler 8/11 + for DVT - Chest CT negative for PE - in the setting of malignancy/ hypercoagulable state - initially placed on Lovenox>>transitioned to Eliquis  - hgb stable at 8.5  3. Persistent Atrial Flutter - failed DC-CVseveral times recently - Rate controlled.   - Continue Eliquis   5. Severe MR - felt to be ischemic MR - May be possible MitraClip candidate depending on lymphoma course  6. High Grade Lymphoma - diagnosed 6/21.  - CT w/ extensive retroperitoneal, pelvic, and inguinal lymphadenopathy. There  is also involvement of  the spleen and near complete infiltration of the right kidney. 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 Suspicious for infiltrating disease of the right iliac and left pubic bones.  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 Alen Blew. Most recent f/u 8/2. Notation regarding prognosis, "Prognosis: Aggressive therapy remains warranted at this time. His disease remains curable potentially if he achieves a complete response upon completing chemotherapy".   7. Neutropenic fever - on chemotherapy for lymphoma - WBC 1.2 on admit, mTemp of 101 at home. Lactic acid 2.2 on admit - treated w/ empiric abx w/ vanc + cefepime  - CXR - for PNA. UA -  - Urine and Blood Cultures NGTD - Resolved.  - now off abx   8. CAD:  - s/p pRCA PCI in 2006 - s/p recent MI with multiple stents in 3/21 (RCA x 2, LCX x 3, LAD x 1) - did not tolerate statin w/ elevated LFTs - not on ? blocker w/ recent low output HF / hypotension  - had not been on ASA given Plavix + Xarelto use (discontinued 7/21)  9. Bleeding - Xarelto was discontinued 7/21 due to hemoptysis and epistaxis - required transfusion x 3 this admit for hgb of 6.4  - now w/ RUE DVT being treated w/ Eliquis.   - Plavix discontinued  - Hgb 8.5 yesterday   10. Rt Pleural Effusion  - S/p Rt thoracentesis 8/16 - fluid cultures NGTD   11. Hypomagnesemia - Mg 1.6 - supp w/ IV MgSO4, discussed w/ pharmD  Dispo: SW assisting w/ SNF placement, pending insurance approval     Length of Stay: [facility] PA-C 09/07/2019, 9:13 AM  Advanced Heart Failure Team Pager [phone] (M-F; 7a - 4p)  Please contact [facility] for night-coverage after hours (4p -7a ) and weekends on [email or web address]   Patient seen and examined with the above-signed Advanced Practice Provider and/or Housestaff. I personally reviewed laboratory data, imaging studies  and relevant notes. I independently examined the patient and formulated the important aspects of the plan. I have edited the note to reflect any of my changes or salient points. I have personally discussed the plan with the patient and/or family.  Volume status looks much better. Back on po diuretics. Mag low. Tolerating Eliquis.   General:  Weak appearing. No resp difficulty HEENT: normal Neck: supple. JVP 7-8. Carotids 2+ bilat; no bruits. No lymphadenopathy or thryomegaly appreciated. Cor: PMI nondisplaced. Irregular rate & rhythm. 2/6 MR Lungs: clear Abdomen: soft, nontender, nondistended. No hepatosplenomegaly. No bruits or masses. Good bowel sounds. Extremities: no cyanosis, clubbing, rash,1+  Edema in thighs Neuro: alert & orientedx3, cranial nerves grossly intact. moves all 4 extremities w/o difficulty. Affect pleasant  Volume status much better. Would continue current lasix dosing.. Likely d/c tomorrow to SNF. We will make f/u in HF Clinic. HF team will sign off. Please call with questions.   Glori Bickers, MD  5:25 PM

## 2019-09-07 NOTE — Progress Notes (Signed)
PROGRESS NOTE    Tony Saunders  QQV:[id number] DOB: 1956/01/03 DOA: 08/28/2019 PCP: [name], [name], MD    Brief Narrative:  Tony Saunders is a 64 year old male with past medical history notable for severe mitral regurgitation, seizure disorder, paroxysmal atrial fibrillation, MI, diffuse large B-cell lymphoma, chronic systolic congestive heart failure, CAD who presented to the emergency department with complaints of right hand pain and swelling.  He was found to have a DVT in the right upper extremity and neutropenic fever.  He was also markedly volume overloaded.  Patient referred for admission by EDP for further evaluation and treatment.   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  Multiple admissions for CHF exacerbations, most recently discharged in July with weight 172lbs. now presents with weight up to 200lbs and requiring supplemental oxygen to maintain SPO2.  TTE June 2021 with LVEF 35-40%. --Cardiology/heart failure service following, appreciate assistance --net negative 670L past 24h and negative 11.5L since admission --Wt 206>172lbs; no weight taken today --On midodrine 10 mg p.o. 3 times daily for blood pressure support --Continue furosemide 80 mg PO daily --Continue spironolactone 12.5 mg p.o. twice daily --Continue supplemental oxygen, maintain SPO2 greater than 92%, on 3 L nasal cannula with SPO2 97% --BiPAP prn  Right pleural effusion Underwent right thoracentesis by PCCM on 09/03/2019. 2.8L of clear yellow fluid  drained from right pleural space.  Etiology likely secondary to decompensated congestive heart failure versus malignancy.  --PCCM following, appreciate assistance --Pleural fluid culture with no growth x3 days --Antibiotics have been discontinued --Oxygen now titrated off and on room air with SPO2 100%.  Neutropenic fever: Resolved Hx diffuse large B-cell lymphoma Newly diagnosed June 2021, CT with extensive retroperitoneal, pelvic and inguinal lymphadenopathy also involvement of the spleen and near complete infiltration of the right kidney; with possible occlusion of the right renal vein and suspicious for infiltrating disease of the right iliac and left pubic bones.  Pathology consistent with monoclonal B-cell population with coexpression of CD10 comprising 65% of all lymphocytes.  10 temperature of 101 at home with a lactic acid of 2.2.  Initially started on empiric antibiotics with vancomycin and cefepime.  Chest x-ray is negative for pneumonia with unrevealing urinalysis.  Urine blood cultures no growth.  Fevers have resolved with normalization of lactic acid --Follow-up with medical oncology, Dr. Alen Blew outpatient --Currently on chemotherapy/XRT --Was initially started on broad-spectrum antibiotics with vancomycin and cefepime which was discontinued on 08/31/2019.  Right upper extremity DVT Upper extremity venous duplex 08/29/2019 + for DVT, CT angio chest negative for pulmonary embolism.  Etiology likely secondary to underlying malignancy and hypercoagulable state.  Patient was previously on Xarelto but was stopped in June 2021 due to anemia and epistasis.  It appears that both Eliquis and Xarelto are cost prohibitive with recommendations of Lovenox injections per discussion with oncology, Dr. Alen Blew. --Continue Eliquis 5 mg p.o. twice daily  Persistent atrial flutter Has failed multiple attempts at [place] in the past.  Currently rate controlled. --Continue amiodarone 200 mg p.o.  daily --Digoxin level elevated 1.7, cardiology holding digoxin  next 2 days with plan to reinitiate at reduced dose 0.0625mg  p.o. daily --Continue anticoagulation with Eliquis  Hx Ischemic cardiomyopathy CAD Recent MI March 2021 with LAD stent placement. --Plavix discontinued in favor of Eliquis per cardiology recommendations  History of seizure disorder: Continue Keppra 750 mg p.o. twice daily  Hypokalemia Hypomagnesemia Repleted during hospitalization.  Potassium 4.0 this morning --Repeat electrolytes in a.m. to include magnesium  Anemia of chronic disease Hemoglobin trended down to 6.4 the day following admission.  Patient was transfused 2 units PRBC on 08/30/2019 and 1 unit on 09/02/2019.  FOBT negative. --Hgb 7.5>6.4>7.8>7.4>9.1>9.5>8.8>8.5  GERD: Continue Protonix 40 mg p.o. daily  Depression: Continue sertraline 25 mg p.o. daily  Severe mitral regurgitation Cardiology following, consideration of possible MitraClip candidate in the future depending on lymphoma course.  Severe protein calorie malnutrition Nutrition Status: Nutrition Problem: Severe Malnutrition Etiology: chronic illness (diffuse large B cell lymphoma currenly undergoing chemotherapy) Signs/Symptoms: severe fat depletion, severe muscle depletion, edema Interventions: Ensure Enlive (each supplement provides 350kcal and 20 grams of protein), Prostat, Liberalize Diet, Other (Comment) (double protein) --Dietitian following, appreciate assistance --Continue encourage increased oral intake, supplements   DVT prophylaxis: Lovenox Code Status: Full code Family Communication: No family present at bedside this morning, attempted to contact patient's spouse [name] via telephone unsuccessful this afternoon  Disposition Plan:  Status is: Inpatient  Remains inpatient appropriate because:Unsafe d/c plan and Inpatient level of care appropriate due to severity of illness will need to assess ambulatory oxygen  needs.   Dispo: The patient is from: Home              Anticipated d/c is to: Home with home health              Anticipated d/c date is: 1 day              Patient currently is not medically stable to d/c.    Consultants:   PCCM  Cardiology  Procedures:   Right thoracentesis 8/16  Antimicrobials:   Vancomycin 8/11 - 8/13  Cefepime 8/11 - 8/13   Subjective: Patient seen and examined bedside, resting comfortably.  Continues with global weakness and fatigue.  Dyspnea improved, now titrated off of supplemental oxygen at rest.  No other questions or concerns at this time.  No family present this morning, attempted to contact patient spouse unsuccessful.  Apparently, patient and family desire to return home with home health, social work assisting with setting up services. Denies headache, no fever/chills/night sweats, no nausea/vomiting/diarrhea, no chest pain, palpitations, no abdominal pain.  No acute events overnight per nursing staff.  Objective: Vitals:   09/07/19 0310 09/07/19 0742 09/07/19 0825 09/07/19 0827  BP: (!) 96/51 97/63    Pulse: 64 62    Resp: 17 15    Temp: 98.3 F (36.8 C) 97.6 F (36.4 C)    TempSrc: Oral Oral    SpO2: 98% 100% 97% 97%  Weight:        Intake/Output Summary (Last 24 hours) at 09/07/2019 1228 Last data filed at 09/07/2019 0500 Gross per 24 hour  Intake --  Output 1150 ml  Net -1150 ml   Filed Weights   09/04/19 0331 09/05/19 0706 09/06/19 0651  Weight: 80.1 kg 75.3 kg 78.4 kg    Examination:  General exam: Appears calm and comfortable, chronically ill in appearance Respiratory system: Decreased breath sounds bilateral bases, with mild crackles, no wheezing, normal respiratory effort, on room air Cardiovascular system: S1 & S2 heard, RRR. No  JVD, murmurs, rubs, gallops or clicks.  Asymmetric lower extremity edema, left greater than right with TED hose in place Gastrointestinal system: Abdomen is nondistended, soft and nontender.  No organomegaly or masses felt. Normal bowel sounds heard. Central nervous system: Alert and oriented. No focal neurological deficits. Extremities: Symmetric 5 x 5 power. Skin: No rashes, lesions or ulcers Psychiatry: Judgement and insight appear normal. Mood & affect appropriate.     Data Reviewed: I have personally reviewed following labs and imaging studies  CBC: Recent Labs  Lab 09/02/19 0418 09/03/19 0415 09/04/19 1122 09/05/19 0535 09/06/19 0505  WBC 6.2 6.2 6.9 6.3 8.1  HGB 7.4* 9.1* 9.5* 8.8* 8.5*  HCT 25.0* 30.0* 31.3* 29.4* 29.2*  MCV 94.3 93.2 92.9 94.2 95.1  PLT 82* 84* 76* 79* 78*   Basic Metabolic Panel: Recent Labs  Lab 09/02/19 0418 09/02/19 0418 09/03/19 0415 09/03/19 0415 09/03/19 1820 09/04/19 1122 09/05/19 0535 09/06/19 0505 09/07/19 0227  NA 138   < > 137   < > 137 137 136 136 133*  K 3.2*   < > 3.1*   < > 4.0 3.2* 3.3* 5.0 4.1  CL 97*   < > 96*   < > 92* 91* 88* 87* 87*  CO2 31   < > 32   < > 35* 40* 42* 42* 38*  GLUCOSE 82   < > 86   < > 85 113* 85 90 105*  BUN 20   < > 19   < > 22 20 18 18 21   CREATININE 1.19   < > 1.16   < > 1.27* 1.15 1.04 1.12 0.99  CALCIUM 7.5*   < > 7.5*   < > 7.4* 7.4* 7.2* 7.5* 7.5*  MG 1.9  --  1.8  --   --   --   --  1.7 1.6*   < > = values in this interval not displayed.   GFR: Estimated Creatinine Clearance: 84.9 mL/min (by C-G formula based on SCr of 0.99 mg/dL). Liver Function Tests: Recent Labs  Lab 09/03/19 1820  AST 27  ALT 16  ALKPHOS 90  BILITOT 1.6*  PROT 4.1*  ALBUMIN 1.9*   No results for input(s): LIPASE, AMYLASE in the last 168 hours. No results for input(s): AMMONIA in the last 168 hours. Coagulation Profile: No results for input(s): INR, PROTIME in the last 168 hours. Cardiac Enzymes: No results for input(s): CKTOTAL, CKMB, CKMBINDEX, TROPONINI in the last 168 hours. BNP (last 3 results) No results for input(s): PROBNP in the last 8760 hours. HbA1C: No results for input(s): HGBA1C in  the last 72 hours. CBG: No results for input(s): GLUCAP in the last 168 hours. Lipid Profile: No results for input(s): CHOL, HDL, LDLCALC, TRIG, CHOLHDL, LDLDIRECT in the last 72 hours. Thyroid Function Tests: No results for input(s): TSH, T4TOTAL, FREET4, T3FREE, THYROIDAB in the last 72 hours. Anemia Panel: No results for input(s): VITAMINB12, FOLATE, FERRITIN, TIBC, IRON, RETICCTPCT in the last 72 hours. Sepsis Labs: No results for input(s): PROCALCITON, LATICACIDVEN in the last 168 hours.  Recent Results (from the past 240 hour(s))  Blood culture (routine x 2)     Status: None   Collection Time: 08/28/19 11:00 PM   Specimen: BLOOD LEFT ARM  Result Value Ref Range Status   Specimen Description BLOOD LEFT ARM  Final   Special Requests   Final    BOTTLES DRAWN AEROBIC AND ANAEROBIC Blood Culture adequate volume   Culture   Final  NO GROWTH 5 DAYS Performed at [facility], [place] [address] [place], [place] [address]    Report Status 09/03/2019 FINAL  Final  Blood culture (routine x 2)     Status: None   Collection Time: 08/28/19 11:13 PM   Specimen: BLOOD LEFT HAND  Result Value Ref Range Status   Specimen Description BLOOD LEFT HAND  Final   Special Requests   Final    BOTTLES DRAWN AEROBIC ONLY Blood Culture adequate volume   Culture   Final    NO GROWTH 5 DAYS Performed at [facility], [place] [address], [place], [place] [address]    Report Status 09/03/2019 FINAL  Final  SARS Coronavirus 2 by RT PCR (hospital order, performed in [facility] hospital lab) Nasopharyngeal Nasopharyngeal Swab     Status: None   Collection Time: 08/29/19  1:33 AM   Specimen: Nasopharyngeal Swab  Result Value Ref Range Status   SARS Coronavirus 2 NEGATIVE NEGATIVE Final    Comment: (NOTE) SARS-CoV-2 target nucleic acids are NOT DETECTED.  The SARS-CoV-2 RNA is generally detectable in upper and lower respiratory specimens during the acute phase of infection. The  lowest concentration of SARS-CoV-2 viral copies this assay can detect is 250 copies / mL. A negative result does not preclude SARS-CoV-2 infection and should not be used as the sole basis for treatment or other patient management decisions.  A negative result may occur with improper specimen collection / handling, submission of specimen other than nasopharyngeal swab, presence of viral mutation(s) within the areas targeted by this assay, and inadequate number of viral copies (<250 copies / mL). A negative result must be combined with clinical observations, patient history, and epidemiological information.  Fact Sheet for Patients:   StrictlyIdeas.no  Fact Sheet for Healthcare Providers: BankingDealers.co.za  This test is not yet approved or  cleared by the [place] FDA and has been authorized for detection and/or diagnosis of SARS-CoV-2 by FDA under an Emergency Use Authorization (EUA).  This EUA will remain in effect (meaning this test can be used) for the duration of the COVID-19 declaration under Section 564(b)(1) of the Act, 21 U.S.C. section 360bbb-3(b)(1), unless the authorization is terminated or revoked sooner.  Performed at [facility], [place] [address] [place], [place] [address]   Urine culture     Status: None   Collection Time: 08/29/19  1:36 AM   Specimen: Urine, Random  Result Value Ref Range Status   Specimen Description URINE, RANDOM  Final   Special Requests NONE  Final   Culture   Final    NO GROWTH Performed at [facility], [place] [address] [place], [place] [address]    Report Status 08/29/2019 FINAL  Final  Body fluid culture     Status: None   Collection Time: 09/03/19  3:00 PM   Specimen: Fluid  Result Value Ref Range Status   Specimen Description FLUID PLEURAL RIGHT  Final   Special Requests NONE  Final   Gram Stain   Final    RARE WBC PRESENT, PREDOMINANTLY MONONUCLEAR NO ORGANISMS  SEEN    Culture   Final    NO GROWTH 3 DAYS Performed at [facility], [place] [address], [place], [place] [address]    Report Status 09/07/2019 FINAL  Final         Radiology Studies: No results found.      Scheduled Meds: . (feeding supplement) PROSource Plus  30 mL  Oral BID BM  . sodium chloride   Intravenous Once  . sodium chloride   Intravenous Once  . acidophilus  2 capsule Oral Daily  . amiodarone  200 mg Oral Daily  . apixaban  5 mg Oral BID  . budesonide  0.5 mg Nebulization BID  . Chlorhexidine Gluconate Cloth  6 each Topical Daily  . feeding supplement (ENSURE ENLIVE)  237 mL Oral TID BM  . furosemide  80 mg Oral Daily  . guaiFENesin  600 mg Oral BID  . ipratropium-albuterol  3 mL Nebulization BID  . lamoTRIgine  100 mg Oral BID  . levETIRAcetam  750 mg Oral BID  . midodrine  10 mg Oral TID WC  . pantoprazole  40 mg Oral Daily  . sertraline  25 mg Oral Daily  . spironolactone  12.5 mg Oral BID   Continuous Infusions: . magnesium sulfate bolus IVPB 4 g (09/07/19 1205)     LOS: 9 days    Time spent: 36 minutes spent on chart review, discussion with nursing staff, consultants, updating family and interview/physical exam; more than 50% of that time was spent in counseling and/or coordination of care.    Liller Yohn J [place] DO [facility] Available via Epic secure chat 7am-7pm After these hours, please refer to coverage provider listed on [email or web address] 09/07/2019, 12:28 PM

## 2019-09-07 NOTE — TOC Progression Note (Addendum)
Transition of Care [facility]) - Progression Note    Patient Details  Name: Tony Saunders MRN: [id number] Date of Birth: [birth year]  Transition [facility] CM/SW Contact  Zenon Mayo, RN Phone Number: 09/07/2019, 12:46 PM  Clinical Narrative:    NCM spoke with patient wife , Tony Saunders, she states to call Tony Saunders at [phone] ext [phone].NCM called and left messagfe for Tony Saunders to return call, also sent her an email. At [email or web address]- Awaiting to hear back.  Tony Saunders the wife states they have a [place] Based medical sharing insurance called [employer], she states they can have [place] services. NCM offered choice wife does not have a preference of the agency. Tony Saunders sent NCM agency's in network , NCM contacted [employer], spoke with Tony Saunders, she is calling Tony Saunders to see what this insurance entails and if they are even able to help. Awaiting call back.   HH has not been set up yet.  There is a list of in network agencies on desk in office.   Expected Discharge Plan: Skilled Nursing Facility Barriers to Discharge: Ship broker, Continued Medical Work up  Expected Discharge Plan and Services Expected Discharge Plan: [place]       Living arrangements for the past 2 months: Single Family Home                                       Social Determinants of Health (SDOH) Interventions    Readmission Risk Interventions Readmission Risk Prevention Plan 07/23/2019  Transportation Screening Complete  Medication Review Press photographer) Complete  HRI or [place] Complete  SW Recovery Care/Counseling Consult Complete  Palliative Care Screening Not [place] Patient Refused  Some recent data might be hidden

## 2019-09-08 LAB — BASIC METABOLIC PANEL
Anion gap: 8 (ref 5–15)
BUN: 22 mg/dL (ref 8–23)
CO2: 38 mmol/L — ABNORMAL HIGH (ref 22–32)
Calcium: 7.5 mg/dL — ABNORMAL LOW (ref 8.9–10.3)
Chloride: 87 mmol/L — ABNORMAL LOW (ref 98–111)
Creatinine, Ser: 0.92 mg/dL (ref 0.61–1.24)
GFR calc Af Amer: >60 mL/min (ref >60)
GFR calc non Af Amer: >60 mL/min (ref >60)
Glucose, Bld: 85 mg/dL (ref 70–99)
Potassium: 4.1 mmol/L (ref 3.5–5.1)
Sodium: 133 mmol/L — ABNORMAL LOW (ref 135–145)

## 2019-09-08 LAB — MAGNESIUM: Magnesium: 1.7 mg/dL (ref 1.7–2.4)

## 2019-09-08 LAB — CBC
HCT: 27.9 % — ABNORMAL LOW (ref 39.0–52.0)
Hemoglobin: 8.2 g/dL — ABNORMAL LOW (ref 13.0–17.0)
MCH: 28.5 pg (ref 26.0–34.0)
MCHC: 29.4 g/dL — ABNORMAL LOW (ref 30.0–36.0)
MCV: 96.9 fL (ref 80.0–100.0)
Platelets: 75 10*3/uL — ABNORMAL LOW (ref 150–400)
RBC: 2.88 MIL/uL — ABNORMAL LOW (ref 4.22–5.81)
RDW: 18.3 % — ABNORMAL HIGH (ref 11.5–15.5)
WBC: 6.8 10*3/uL (ref 4.0–10.5)
nRBC: 0 % (ref 0.0–0.2)

## 2019-09-08 MED ORDER — MAGNESIUM SULFATE 4 GM/100ML IV SOLN
4.0000 g | Freq: Once | INTRAVENOUS | Status: AC
  Administered 2019-09-08: 4 g via INTRAVENOUS
  Filled 2019-09-08: qty 100

## 2019-09-08 NOTE — Progress Notes (Addendum)
PROGRESS NOTE    Tony Saunders  PYP:[id number] DOB: [birth year] DOA: 08/28/2019 PCP: [name], [name], MD    Brief Narrative:  Tony Saunders is a 64 year old male with past medical history notable for severe mitral regurgitation, seizure disorder, paroxysmal atrial fibrillation, MI, diffuse large B-cell lymphoma, chronic systolic congestive heart failure, CAD who presented to the emergency department with complaints of right hand pain and swelling.  He was found to have a DVT in the right upper extremity and neutropenic fever.  He was also markedly volume overloaded.  Patient referred for admission by EDP for further evaluation and treatment.   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  Multiple admissions for CHF exacerbations, most recently discharged in July with weight 172lbs. now presents with weight up to 200lbs and requiring supplemental oxygen to maintain SPO2.  TTE June 2021 with LVEF 35-40%. --Cardiology/heart failure service following, appreciate assistance --net negative 643L past 24h and negative 12.2L since admission --Wt 206>169.5lbs; no weight taken today --On midodrine 10 mg p.o. 3 times daily for blood pressure support --Continue furosemide 80 mg PO daily --Continue spironolactone 12.5 mg p.o. twice daily --Continue supplemental oxygen, maintain SPO2 greater than 92%, on 2 L nasal cannula with SPO2 98% --PT/RN for O2 desaturation screen to assess home O2 needs  Right pleural effusion Underwent right thoracentesis  by PCCM on 09/03/2019. 2.8L of clear yellow fluid drained from right pleural space.  Etiology likely secondary to decompensated congestive heart failure versus malignancy.  --PCCM following, appreciate assistance --Pleural fluid culture with no growth x3 days --Antibiotics have been discontinued --Continues on 2 L supplemental oxygen via nasal cannula  Neutropenic fever: Resolved Hx diffuse large B-cell lymphoma Newly diagnosed June 2021, CT with extensive retroperitoneal, pelvic and inguinal lymphadenopathy also involvement of the spleen and near complete infiltration of the right kidney; with possible occlusion of the right renal vein and suspicious for infiltrating disease of the right iliac and left pubic bones.  Pathology consistent with monoclonal B-cell population with coexpression of CD10 comprising 65% of all lymphocytes.  10 temperature of 101 at home with a lactic acid of 2.2.  Initially started on empiric antibiotics with vancomycin and cefepime.  Chest x-ray is negative for pneumonia with unrevealing urinalysis.  Urine blood cultures no growth.  Fevers have resolved with normalization of lactic acid --Follow-up with medical oncology, Dr. Alen Blew outpatient --Currently on chemotherapy/XRT --Was initially started on broad-spectrum antibiotics with vancomycin and cefepime which was discontinued on 08/31/2019.  Right upper extremity DVT Upper extremity venous duplex 08/29/2019 + for DVT, CT angio chest negative for pulmonary embolism.  Etiology likely secondary to underlying malignancy and hypercoagulable state.  Patient was previously on Xarelto but was stopped in June 2021 due to anemia and epistasis.  It appears that both Eliquis and Xarelto are cost prohibitive with recommendations of Lovenox injections per discussion with oncology, Dr. Alen Blew. --Continue Eliquis 5 mg p.o. twice daily  Persistent atrial flutter Has failed multiple attempts at [place] in the past.  Currently rate  controlled. --Continue amiodarone 200 mg p.o. daily --Digoxin  level elevated 1.7, cardiology holding digoxin next 2 days with plan to reinitiate at reduced dose 0.0625mg  p.o. daily --Continue anticoagulation with Eliquis  Hx Ischemic cardiomyopathy CAD Recent MI March 2021 with LAD stent placement. --Plavix discontinued in favor of Eliquis per cardiology recommendations  History of seizure disorder: Continue Keppra 750 mg p.o. twice daily  Hypokalemia Hypomagnesemia Repleted during hospitalization.  Potassium 4.0 this morning --Repeat electrolytes in a.m. to include magnesium  Anemia of chronic disease Hemoglobin trended down to 6.4 the day following admission.  Patient was transfused 2 units PRBC on 08/30/2019 and 1 unit on 09/02/2019.  FOBT negative. --Hgb 7.5>6.4>7.8>7.4>9.1>9.5>8.8>8.5  GERD: Continue Protonix 40 mg p.o. daily  Depression: Continue sertraline 25 mg p.o. daily  Severe mitral regurgitation Cardiology following, consideration of possible MitraClip candidate in the future depending on lymphoma course.  Severe protein calorie malnutrition Nutrition Status: Nutrition Problem: Severe Malnutrition Etiology: chronic illness (diffuse large B cell lymphoma currenly undergoing chemotherapy) Signs/Symptoms: severe fat depletion, severe muscle depletion, edema Interventions: Ensure Enlive (each supplement provides 350kcal and 20 grams of protein), Prostat, Liberalize Diet, Other (Comment) (double protein) --Dietitian following, appreciate assistance --Continue encourage increased oral intake, supplements   DVT prophylaxis: Lovenox Code Status: Full code Family Communication: No family present at bedside this morning  Disposition Plan:  Status is: Inpatient  Remains inpatient appropriate because:Unsafe d/c plan and Inpatient level of care appropriate due to severity of illness pending assessment of ambulatory oxygen needs for home O2 needs.   Dispo: The patient is  from: Home              Anticipated d/c is to: Home with home health if social work able to find agency              Anticipated d/c date is: 1 day              Patient currently is medically stable to d/c.    Consultants:   PCCM  Cardiology  Procedures:   Right thoracentesis 8/16  Antimicrobials:   Vancomycin 8/11 - 8/13  Cefepime 8/11 - 8/13   Subjective: Patient seen and examined bedside, resting comfortably.  Placed back on oxygen overnight for symptomatic shortness of breath, no hypoxia charted in the EMR.  Continues with weakness and fatigue.  Refuses SNF placement and wishes to return home.  Difficulty finding home health agency and discussed with patient may need to return home without home health which she understands.  Will need to see if patient meets requirements for home O2.  No other complaints or concerns at this time.  No family present this morning. Denies headache, no fever/chills/night sweats, no nausea/vomiting/diarrhea, no chest pain, palpitations, no abdominal pain.  No acute events overnight per nursing staff.  Objective: Vitals:   09/08/19 0721 09/08/19 0722 09/08/19 0745 09/08/19 1000  BP:   (!) 93/49 (!) 92/52  Pulse:   76 63  Resp:   20 20  Temp:   97.8 F (36.6 C)   TempSrc:   Oral   SpO2: 97% 100% 98% 98%  Weight:        Intake/Output Summary (Last 24 hours) at 09/08/2019 1102 Last data filed at 09/08/2019 0745 Gross per 24 hour  Intake 340 ml  Output 1150 ml  Net -810 ml   Filed Weights   09/05/19 0706 09/06/19 0651 09/07/19 1453  Weight: 75.3 kg 78.4 kg 76.9 kg    Examination:  General exam: Appears calm and comfortable, chronically ill in appearance Respiratory  system: Decreased breath sounds bilateral bases, with mild crackles, no wheezing, normal respiratory effort, on 2 L nasal cannula with SPO2 98% Cardiovascular system: S1 & S2 heard, RRR. No JVD, murmurs, rubs, gallops or clicks.  Asymmetric lower extremity edema, left greater  than right with TED hose in place Gastrointestinal system: Abdomen is nondistended, soft and nontender. No organomegaly or masses felt. Normal bowel sounds heard. Central nervous system: Alert and oriented. No focal neurological deficits. Extremities: Symmetric 5 x 5 power. Skin: No rashes, lesions or ulcers Psychiatry: Judgement and insight appear normal. Mood & affect appropriate.     Data Reviewed: I have personally reviewed following labs and imaging studies  CBC: Recent Labs  Lab 09/03/19 0415 09/04/19 1122 09/05/19 0535 09/06/19 0505 09/08/19 0500  WBC 6.2 6.9 6.3 8.1 6.8  HGB 9.1* 9.5* 8.8* 8.5* 8.2*  HCT 30.0* 31.3* 29.4* 29.2* 27.9*  MCV 93.2 92.9 94.2 95.1 96.9  PLT 84* 76* 79* 78* 75*   Basic Metabolic Panel: Recent Labs  Lab 09/02/19 0418 09/02/19 0418 09/03/19 0415 09/03/19 1820 09/04/19 1122 09/05/19 0535 09/06/19 0505 09/07/19 0227 09/08/19 0500  NA 138   < > 137   < > 137 136 136 133* 133*  K 3.2*   < > 3.1*   < > 3.2* 3.3* 5.0 4.1 4.1  CL 97*   < > 96*   < > 91* 88* 87* 87* 87*  CO2 31   < > 32   < > 40* 42* 42* 38* 38*  GLUCOSE 82   < > 86   < > 113* 85 90 105* 85  BUN 20   < > 19   < > 20 18 18 21 22   CREATININE 1.19   < > 1.16   < > 1.15 1.04 1.12 0.99 0.92  CALCIUM 7.5*   < > 7.5*   < > 7.4* 7.2* 7.5* 7.5* 7.5*  MG 1.9  --  1.8  --   --   --  1.7 1.6* 1.7   < > = values in this interval not displayed.   GFR: Estimated Creatinine Clearance: 90.6 mL/min (by C-G formula based on SCr of 0.92 mg/dL). Liver Function Tests: Recent Labs  Lab 09/03/19 1820  AST 27  ALT 16  ALKPHOS 90  BILITOT 1.6*  PROT 4.1*  ALBUMIN 1.9*   No results for input(s): LIPASE, AMYLASE in the last 168 hours. No results for input(s): AMMONIA in the last 168 hours. Coagulation Profile: No results for input(s): INR, PROTIME in the last 168 hours. Cardiac Enzymes: No results for input(s): CKTOTAL, CKMB, CKMBINDEX, TROPONINI in the last 168 hours. BNP (last 3  results) No results for input(s): PROBNP in the last 8760 hours. HbA1C: No results for input(s): HGBA1C in the last 72 hours. CBG: No results for input(s): GLUCAP in the last 168 hours. Lipid Profile: No results for input(s): CHOL, HDL, LDLCALC, TRIG, CHOLHDL, LDLDIRECT in the last 72 hours. Thyroid Function Tests: No results for input(s): TSH, T4TOTAL, FREET4, T3FREE, THYROIDAB in the last 72 hours. Anemia Panel: No results for input(s): VITAMINB12, FOLATE, FERRITIN, TIBC, IRON, RETICCTPCT in the last 72 hours. Sepsis Labs: No results for input(s): PROCALCITON, LATICACIDVEN in the last 168 hours.  Recent Results (from the past 240 hour(s))  Body fluid culture     Status: None   Collection Time: 09/03/19  3:00 PM   Specimen: Fluid  Result Value Ref Range Status   Specimen Description FLUID PLEURAL RIGHT  Final  Special Requests NONE  Final   Gram Stain   Final    RARE WBC PRESENT, PREDOMINANTLY MONONUCLEAR NO ORGANISMS SEEN    Culture   Final    NO GROWTH 3 DAYS Performed at [facility], [address], [place], [place] [address]    Report Status 09/07/2019 FINAL  Final         Radiology Studies: No results found.      Scheduled Meds: . (feeding supplement) PROSource Plus  30 mL Oral BID BM  . sodium chloride   Intravenous Once  . sodium chloride   Intravenous Once  . acidophilus  2 capsule Oral Daily  . amiodarone  200 mg Oral Daily  . apixaban  5 mg Oral BID  . budesonide  0.5 mg Nebulization BID  . Chlorhexidine Gluconate Cloth  6 each Topical Daily  . feeding supplement (ENSURE ENLIVE)  237 mL Oral TID BM  . furosemide  80 mg Oral Daily  . guaiFENesin  600 mg Oral BID  . ipratropium-albuterol  3 mL Nebulization BID  . lamoTRIgine  100 mg Oral BID  . levETIRAcetam  750 mg Oral BID  . midodrine  10 mg Oral TID WC  . pantoprazole  40 mg Oral Daily  . sertraline  25 mg Oral Daily  . spironolactone  12.5 mg Oral BID   Continuous Infusions:     LOS: 10 days    Time spent: 36 minutes spent on chart review, discussion with nursing staff, consultants, updating family and interview/physical exam; more than 50% of that time was spent in counseling and/or coordination of care.    Deeric Cruise J [place] DO [facility] Available via Epic secure chat 7am-7pm After these hours, please refer to coverage provider listed on [email or web address] 09/08/2019, 11:02 AM

## 2019-09-08 NOTE — TOC Progression Note (Signed)
Transition of Care [facility]) - Progression Note    Patient Details  Name: Tony Saunders MRN: [id number] Date of Birth: [birth year]  Transition [facility] Care [facility]) CM/SW Contact  Bartholomew Crews, RN Phone Number: [phone] 09/08/2019, 1:33 PM  Clinical Narrative:     Spoke with patient's spouse, [name], on the phone. [name] wanting to know about patient applying for Medicaid or Medicare. She states that patient has been able to fully participate in work for quite awhile. Encouraged to seek professional disability guidance for her questions. Discussed that financial navigators at hospital typically assist patients with no insurance. Advised that disability application can be initiated online, but for questions encouraged professional expert follow up. [name] verbalized understanding.   Discussed Eliquis copay card. Card already provided to [place] by [place]. Encouraged to provide card to pharmacist who will activate card.   Discussed DME in home. Patient has a lift chair. He has a walker and a wheelchair that were purchased off [employer]. He has a gait belt. He has 3N1 provided after earlier hospitalization.   Discussed NCM attempts to reach out in network [facility] agency. Attempt x 2 to call [employer] with no answer and no vm set up. Other agencies on list do not have weekend hours.   Debbbie states that patient has a chemo appointment scheduled for Monday.   Discussed transportation home. [name] states that she and her son will pick patient up in private vehicle. Discussed patient's limited mobility.   Discussed need for DME oxygen and referral sent to [facility].   TOC following for transition needs.   Expected Discharge Plan: Skilled Nursing Facility Barriers to Discharge: Ship broker, Continued Medical Work up  Expected Discharge Plan and Services Expected Discharge Plan: [place]       Living arrangements for the past 2 months: Single Family Home                                        Social Determinants of Health (SDOH) Interventions    Readmission Risk Interventions Readmission Risk Prevention Plan 07/23/2019  Transportation Screening Complete  Medication Review Press photographer) Complete  HRI or [place] Complete  SW Recovery Care/Counseling Consult Complete  Palliative Care Screening Not [place] Patient Refused  Some recent data might be hidden

## 2019-09-08 NOTE — Progress Notes (Signed)
SATURATION QUALIFICATIONS: (This note is used to comply with regulatory documentation for home oxygen)  Patient Saturations on Room Air at Rest = 86%  Patient Saturations on Room Air while Ambulating = NA; pt in non-ambulatory  Patient Saturations on 2 Liters of oxygen while supine = 98%  Please briefly explain why patient needs home oxygen:  **NOTE-Per Physical Therapy note 8/19/21pt is non-ambulatory and requires +2 max assist to attempt to stand with barely clearing buttocks off the bed.   Per discussion with patient's nurse today, she removed patient's oxygen with pt at rest and does desaturate to 86% with incr work of breathing. RN very busy and agreed PT could enter saturation qualification note.   Arby Barrette, PT Pager [phone]

## 2019-09-08 NOTE — TOC Progression Note (Signed)
Transition of Care [facility]) - Progression Note    Patient Details  Name: Tony Saunders MRN: [id number] Date of Birth: [birth year]  Transition of Care [facility]) CM/SW Contact  Bartholomew Crews, RN Phone Number: [phone] 09/08/2019, 11:23 AM  Clinical Narrative:     Attempted to contact home health care providers from provided agency list. Most agencies do not have weekend hours. [facility] did not answer and voicemail was not available. Patient may likely need to consider going to outpatient PT.   Patient is pending PT evaluation of oxygen needs.   TOC following for transition needs.   Expected Discharge Plan: Skilled Nursing Facility Barriers to Discharge: Ship broker, Continued Medical Work up  Expected Discharge Plan and Services Expected Discharge Plan: [place]       Living arrangements for the past 2 months: Single Family Home                                       Social Determinants of Health (SDOH) Interventions    Readmission Risk Interventions Readmission Risk Prevention Plan 07/23/2019  Transportation Screening Complete  Medication Review Press photographer) Complete  HRI or [place] Complete  SW Recovery Care/Counseling Consult Complete  Palliative Care Screening Not [place] Patient Refused  Some recent data might be hidden

## 2019-09-09 LAB — CBC
HCT: 28 % — ABNORMAL LOW (ref 39.0–52.0)
Hemoglobin: 8.2 g/dL — ABNORMAL LOW (ref 13.0–17.0)
MCH: 27.9 pg (ref 26.0–34.0)
MCHC: 29.3 g/dL — ABNORMAL LOW (ref 30.0–36.0)
MCV: 95.2 fL (ref 80.0–100.0)
Platelets: 84 10*3/uL — ABNORMAL LOW (ref 150–400)
RBC: 2.94 MIL/uL — ABNORMAL LOW (ref 4.22–5.81)
RDW: 18.2 % — ABNORMAL HIGH (ref 11.5–15.5)
WBC: 7.4 10*3/uL (ref 4.0–10.5)
nRBC: 0 % (ref 0.0–0.2)

## 2019-09-09 LAB — BASIC METABOLIC PANEL
Anion gap: 6 (ref 5–15)
BUN: 20 mg/dL (ref 8–23)
CO2: 41 mmol/L — ABNORMAL HIGH (ref 22–32)
Calcium: 7.7 mg/dL — ABNORMAL LOW (ref 8.9–10.3)
Chloride: 88 mmol/L — ABNORMAL LOW (ref 98–111)
Creatinine, Ser: 0.98 mg/dL (ref 0.61–1.24)
GFR calc Af Amer: >60 mL/min (ref >60)
GFR calc non Af Amer: >60 mL/min (ref >60)
Glucose, Bld: 89 mg/dL (ref 70–99)
Potassium: 3.7 mmol/L (ref 3.5–5.1)
Sodium: 135 mmol/L (ref 135–145)

## 2019-09-09 LAB — MAGNESIUM: Magnesium: 2.3 mg/dL (ref 1.7–2.4)

## 2019-09-09 MED ORDER — DIGOXIN 125 MCG PO TABS: 0.0625 mg | ORAL_TABLET | Freq: Every day | ORAL | 0 refills | Status: AC

## 2019-09-09 MED ORDER — APIXABAN 5 MG PO TABS: 5.0000 mg | ORAL_TABLET | Freq: Two times a day (BID) | ORAL | 0 refills | Status: AC

## 2019-09-09 MED ORDER — MIDODRINE HCL 10 MG PO TABS: 10.0000 mg | ORAL_TABLET | Freq: Three times a day (TID) | ORAL | 0 refills | Status: AC

## 2019-09-09 MED ORDER — SPIRONOLACTONE 25 MG PO TABS: 12.5000 mg | ORAL_TABLET | Freq: Two times a day (BID) | ORAL | 0 refills | Status: AC

## 2019-09-09 MED ORDER — FUROSEMIDE 80 MG PO TABS: 80.0000 mg | ORAL_TABLET | Freq: Every day | ORAL | 0 refills | Status: AC

## 2019-09-09 NOTE — TOC Transition Note (Addendum)
Transition of Care (TOC) - CM/SW Discharge Note Marvetta Lazenby RN,BSN Transitions of Care Unit 4NP (non trauma) - RN Case Manager See Treatment Team for direct Phone # Weekend Coverage 2C  Patient Details  Name: Tony Saunders MRN: [id number] Date of Birth: 05-21-1955  Transition of Care [facility]) CM/SW Contact:  Dawayne Patricia, RN Phone Number: 09/09/2019, 10:10 AM   Clinical Narrative:    PT stable for transition, order placed for home 02- referral has been given to [facility] per previous CM note, portable equipment confirmed  delivered to the room per [facility].  Unable to secure [facility] services- order given for outpt PT referral. Referral sent to [facility] street location which is on the list of [facility] providers sent by [facility].     Final next level of care: OP Rehab Barriers to Discharge: Ship broker, Continued Medical Work up   Patient Goals and CMS Choice Patient states their goals for this hospitalization and ongoing recovery are:: To go to rehab CMS Medicare.gov Compare Post Acute Care list provided to:: Patient Choice offered to / list presented to : Patient  Discharge Placement               Home        Discharge Plan and Services                     Home with outpt                 Social Determinants of Health (SDOH) Interventions     Readmission Risk Interventions Readmission Risk Prevention Plan 09/09/2019 07/23/2019  Transportation Screening Complete Complete  Medication Review Press photographer) Complete Complete  PCP or Specialist appointment within 3-5 days of discharge Complete -  [place] or [place] Complete Complete  SW Recovery Care/Counseling Consult Complete Complete  Palliative Care Screening Not Applicable Not [place] Patient Refused Patient Refused  Some recent data might be hidden

## 2019-09-09 NOTE — Discharge Summary (Signed)
Physician Discharge Summary  Tony Saunders DQQ:[id number] DOB: 1955-10-28 DOA: 08/28/2019  PCP: [name], [name], MD  Admit date: 08/28/2019 Discharge date: 09/09/2019  Admitted From: Home Disposition: Home   Recommendations for Outpatient Follow-up:  1. Follow up with PCP in 1-2 weeks 2. Follow-up with cardiology/heart failure service as scheduled 3. Increased furosemide from 40 to 80 mg p.o. daily 4. Started on spironolactone 5. Increase midodrine from 5 mg to 10 mg p.o. 3 times daily 6. Discontinue Plavix in favor of new started on Eliquis for DVT 7. Decrease dose of digoxin to 0.0625 mg p.o. daily for elevated digoxin level per cardiology 8. Discharging home on supplemental oxygen, 2 L per nasal cannula 9. Please obtain BMP/CBC in one week 10. Recommend repeat chest x-ray in 1-2 weeks for follow-up of pleural effusion 11. Patient declined SNF placement and no home health services agency will was able to offer services 12. Referred to outpatient physical therapy 13. Recommend further conversations regards of goals of care and medical decision making, recommend outpatient palliative referral  Home Health: Unable to find home health agency to offer services Equipment/Devices: None  Discharge Condition: Stable, but ultimately poor prognosis moving forward given his significant comorbidities and declining functional status CODE STATUS: Full code Diet recommendation: Diet liberalized due to severe protein calorie malnutrition; try to avoid high salt containing foods  History of present illness:  Tony Saunders is a 64 year old male with past medical history notable for severe mitral regurgitation, seizure disorder, paroxysmal atrial fibrillation, MI, diffuse large B-cell lymphoma, chronic systolic congestive heart failure, CAD who presented to the emergency department with complaints of right hand pain and swelling.  He was found to have a DVT in the right upper  extremity and neutropenic fever.  He was also markedly volume overloaded.  Patient referred for admission by EDP for further evaluation and treatment.  Hospital course: Acute hypoxic respiratory failure Acute on chronic systolic congestive heart failure Patient presenting with progressive dyspnea, weight gain and lower extremity edema.  Multiple admissions for CHF exacerbations, most recently discharged in July with weight 172lbs. now presents with weight up to 200lbs and requiring supplemental oxygen to maintain SPO2.  TTE June 2021 with LVEF 35-40%.  Cardiology/heart failure service was consulted and followed during hospital course.  Patient was started on IV diuresis with  Negative 12.5 L fluid loss 16 kg weight loss during hospitalization.  Weight at time of discharge 76.9 kg.  Patient will continue midodrine 10 mg p.o. 3 times daily, furosemide 80 mg p.o. daily, spironolactone 12.5 mg p.o. twice daily on discharge.  Also will continue supplemental oxygen 2 L per nasal cannula.  Outpatient follow-up with cardiology/heart failure service as scheduled.  Right pleural effusion Underwent right thoracentesis by PCCM on 09/03/2019. 2.8L of clear yellow fluid drained from right pleural space.  Etiology likely secondary to decompensated congestive heart failure versus malignancy.  PCCM was consulted and underwent thoracentesis with no growth and pleural fluid culture.  Unfortunately cytology was not performed fluid.  Will discharge on supplemental oxygen as above.  Recommend repeat chest x-ray in 1-2 weeks for follow-up.  If reaccumulation, recommend serial thoracentesis versus referral back to pulmonary for consideration of Aspira drain placement.  Neutropenic fever: Resolved Hx diffuse large B-cell lymphoma Newly diagnosed June 2021, CT with extensive retroperitoneal, pelvic and inguinal lymphadenopathy also involvement of the spleen and near complete infiltration of the right kidney; with possible  occlusion of the right renal vein and suspicious for infiltrating  disease of the right iliac and left pubic bones.  Pathology consistent with monoclonal B-cell population with coexpression of CD10 comprising 65% of all lymphocytes.  10 temperature of 101 at home with a lactic acid of 2.2.  Initially started on empiric antibiotics with vancomycin and cefepime.  Chest x-ray is negative for pneumonia with unrevealing urinalysis.  Urine blood cultures no growth.  Fevers have resolved with normalization of lactic acid.  Continue chemotherapy/XRT and outpatient follow-up with medical oncology, Dr. Alen Blew.  Recommend continue discussions regarding goals of care given his poor prognosis and decline in functional status.  Right upper extremity DVT Upper extremity venous duplex 08/29/2019 + for DVT, CT angio chest negative for pulmonary embolism.  Etiology likely secondary to underlying malignancy and hypercoagulable state.  Patient was previously on Xarelto but was stopped in June 2021 due to anemia and epistasis.  Per cardiology recommendations, patient was started on Eliquis 5 mg p.o. twice daily.  Persistent atrial flutter Has failed multiple attempts at [place] in the past.  Currently rate controlled. Continue amiodarone 200 mg p.o. daily. Digoxin level elevated 1.7, cardiology recommended reduced dose digoxin 0.0625mg  p.o. daily. Continue anticoagulation with Eliquis.  Outpatient follow-up with cardiology  Hx Ischemic cardiomyopathy CAD Recent MI March 2021 with LAD stent placement. Plavix discontinued in favor of Eliquis per cardiology recommendations  History of seizure disorder: Continue Keppra 750 mg p.o. twice daily  Hypokalemia Hypomagnesemia Repleted during hospitalization.  Potassium 3.7 this morning at time of discharge.  Continue potassium supplementation outpatient.  Anemia of chronic disease Hemoglobin trended down to 6.4 the day following admission.  Patient was transfused 2 units PRBC  on 08/30/2019 and 1 unit on 09/02/2019.  FOBT negative.  Hemoglobin 8.2 at time of discharge.  Recommend repeat CBC in the next PCP/specialist visit.  GERD: Continue Protonix 40 mg p.o. daily  Depression: Continue sertraline 25 mg p.o. daily  Severe mitral regurgitation Cardiology following, consideration of possible MitraClip candidate in the future depending on lymphoma course.  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 Q day.  Change foam dressings Q 3 days or PRN soiling.    Wound care  Until discontinued      Comments: Medihoney dressings apply to left buttock, bilat buttocks, or arm wounds Q day or as desired. Cover with foam dressings and change foam dressings Q 3 days or PRN soiling.  Increase activity slowly   Complete by: As  directed      Allergies as of 09/09/2019   No Known Allergies     Medication List    STOP taking these medications   clopidogrel 75 MG tablet Commonly known as: PLAVIX   mirtazapine 15 MG disintegrating tablet Commonly known as: REMERON SOL-TAB     TAKE these medications   acetaminophen 325 MG tablet Commonly known as: TYLENOL Take 650 mg by mouth every 6 (six) hours as needed for mild pain or headache.   allopurinol 300 MG tablet Commonly known as: ZYLOPRIM TAKE ONE TABLET BY MOUTH DAILY   amiodarone 200 MG tablet Commonly known as: PACERONE Take 1 tablet (200 mg total) by mouth daily.   apixaban 5 MG Tabs tablet Commonly known as: ELIQUIS Take 1 tablet (5 mg total) by mouth 2 (two) times daily.   budesonide 0.5 MG/2ML nebulizer solution Commonly known as: PULMICORT Take 2 mLs (0.5 mg total) by nebulization 2 (two) times daily. What changed:   when to take this  reasons to take this   digoxin 0.125 MG tablet Commonly known as: Lanoxin Take 0.5 tablets (0.0625 mg total) by mouth daily. What changed: how much to take   feeding supplement (ENSURE ENLIVE) Liqd Take 237 mLs by mouth 2 (two) times daily between meals.   furosemide 80 MG tablet Commonly known as: LASIX Take 1 tablet (80 mg total) by mouth daily. Start taking on: September 10, 2019 What changed:   medication strength  how much to take   lamoTRIgine 100 MG tablet Commonly known as: LAMICTAL Take 1 tablet (100 mg total) by mouth 2 (two) times daily.   levETIRAcetam 750 MG tablet Commonly known as: KEPPRA Take 1 tablet (750 mg total) by mouth 2 (two) times daily.   lidocaine-prilocaine cream Commonly known as: EMLA Apply 1 application topically as needed.   Medihoney Wound/Burn Dressing Gel Apply 1.5 mLs topically as needed.   midodrine 10 MG tablet Commonly known as: PROAMATINE Take 1 tablet (10 mg total) by mouth 3 (three) times daily with meals. What changed:   medication  strength  how much to take   ondansetron 4 MG tablet Commonly known as: ZOFRAN Take 1 tablet (4 mg total) by mouth every 6 (six) hours as needed for nausea.   pantoprazole 40 MG tablet Commonly known as: PROTONIX Take 1 tablet (40 mg total) by mouth daily.   potassium chloride SA 20 MEQ tablet Commonly known as: KLOR-CON Take 1 tablet (20 mEq total) by mouth daily.   predniSONE 50 MG tablet Commonly known as: DELTASONE Take 2 tablets for 5 days every 21 days with chemotherapy.   prochlorperazine 10 MG tablet Commonly known as: COMPAZINE Take 1 tablet (10 mg total) by mouth every 6 (six) hours as needed for nausea or vomiting.   rosuvastatin 5 MG tablet Commonly known as: CRESTOR Take 1 tablet (5 mg total) by mouth daily.   sertraline 25 MG tablet Commonly known as: ZOLOFT Take 1 tablet (25 mg total) by mouth daily.   spironolactone 25 MG tablet Commonly known as: ALDACTONE Take 0.5 tablets (12.5 mg total) by mouth 2 (two) times daily.            Durable Medical Equipment  (From admission, onward)         Start     Ordered   09/08/19 1218  For home use only DME oxygen  Once       Question Answer Comment  Length of Need 12 Months   Mode or (Route) Nasal cannula   Liters per Minute 2   Frequency Continuous (stationary and portable oxygen unit needed)   Oxygen delivery system Gas      09/08/19 1217           Discharge Care Instructions  (From admission, onward)         Start     Ordered   09/09/19 0000  Discharge wound care:       Comments: Wound care  Daily      Comments: Apply xeroform gauze and foam dressing to weeping skin tears on arms or legs. Q day.  Change foam dressings Q 3 days or PRN soiling.    Wound care  Until discontinued      Comments: Medihoney dressings apply to left buttock, bilat buttocks, or arm wounds Q day or as desired. Cover with foam dressings and change foam dressings Q 3 days or PRN soiling.   09/09/19 0947           Follow-up Information    [facility] Follow up on 09/26/2019.   Specialty: Cardiology Why: 10:00 AM Advanced [place] Code 4007 Contact information: [address] [phone] [place]       [name], [name], MD. Schedule an appointment as soon as possible for a visit in 1 week(s).   Specialty: Internal Medicine Contact information: [place] [address] [phone]        Leonie Man, MD .   Specialty: Cardiology Contact information: [address] [place] [address] [phone]              No Known Allergies  Consultations:  PCCM  Cardiology   Procedures/Studies: DG Chest 2 View  Result Date: 08/28/2019 CLINICAL DATA:  64 year old male with fever.  Lymphoma EXAM: CHEST - 2 VIEW COMPARISON:  CT Chest, Abdomen, and Pelvis 08/17/2019, and earlier. FINDINGS: Seated AP and lateral views of the chest. Moderate bilateral pleural effusions appear mildly regressed from 08/17/2019. No superimposed pneumothorax. Coarse bilateral perihilar opacity has regressed since June but not resolved. Grossly stable mediastinal contours. Right chest porta cath is stable from last month. No acute osseous abnormality identified. Paucity of bowel gas in the upper abdomen. IMPRESSION: 1. Moderate bilateral pleural effusions, decreased from large bilateral effusions seen last month. 2. Regressed but not fully resolved coarse bilateral perihilar opacity since June, nonspecific but might be lymphoma treatment related. Electronically Signed   By: Genevie Ann M.D.   On: 08/28/2019 23:25   CT ANGIO CHEST PE W OR WO CONTRAST  Result Date: 08/29/2019 CLINICAL DATA:  PE suspected, high probability. Dyspnea, fever. Bilateral lower extremity edema. Lymphoma. EXAM: CT ANGIOGRAPHY CHEST WITH CONTRAST TECHNIQUE: Multidetector CT imaging of  the chest was performed using the standard protocol during bolus administration of intravenous contrast. Multiplanar CT image reconstructions and MIPs were obtained to evaluate the vascular anatomy. CONTRAST:  62mL OMNIPAQUE IOHEXOL 350 MG/ML SOLN COMPARISON:  Two-view chest x-ray 08/28/2019. CT of the chest, abdomen, and pelvis 08/17/2019 FINDINGS: Cardiovascular: The heart size is normal. Dense coronary artery calcifications are present. Aorta and great vessel origins are within normal limits. Pulmonary artery opacification is excellent. No focal filling defects are present to suggest pulmonary embolus. Mediastinum/Nodes: Right paratracheal adenopathy is similar the prior study. Right hilar nodal tissue is again noted. Subcentimeter axillary nodes are present.  Lungs/Pleura: Large bilateral pleural effusions are similar the prior exam centrilobular emphysema is again noted. Chronic airspace opacities are stable. Bronchiectasis is again noted. Increased ground-glass attenuation is evident. Upper Abdomen: Visualized upper abdomen is unremarkable. Musculoskeletal: Vertebral body heights and alignment are normal. Sternum and ribs are unremarkable. Review of the MIP images confirms the above findings. IMPRESSION: 1. No evidence for pulmonary embolus. 2. Stable large bilateral pleural effusions and diffuse ground-glass attenuation. Findings are consistent with congestive heart failure. 3. Stable chronic airspace opacities and bronchiectasis. 4. Stable mediastinal and right hilar adenopathy. 5. Dense coronary artery calcifications. 6. Emphysema (ICD10-J43.9). Electronically Signed   By: San Morelle M.D.   On: 08/29/2019 05:37   CT CHEST ABDOMEN PELVIS W CONTRAST  Result Date: 08/18/2019 CLINICAL DATA:  Lymphoma restaging. EXAM: CT CHEST, ABDOMEN, AND PELVIS WITH CONTRAST TECHNIQUE: Multidetector CT imaging of the chest, abdomen and pelvis was performed following the standard protocol during bolus  administration of intravenous contrast. CONTRAST:  141mL OMNIPAQUE IOHEXOL 300 MG/ML  SOLN COMPARISON:  Abdomen pelvis CT 05/22/2019 FINDINGS: CT CHEST FINDINGS Cardiovascular: The heart size is normal. No substantial pericardial effusion. Coronary artery calcification is evident. No thoracic aortic aneurysm. Right Port-A-Cath tip is positioned in the SVC/RA junction. Inflow of unopacified blood from the azygos vein noted in the SVC, but associated fibrin sheath or small thrombus associated with the tip of the SVC catheter suspected. Mediastinum/Nodes: No mediastinal lymphadenopathy. There is no hilar lymphadenopathy. The esophagus has normal imaging features. There is no axillary lymphadenopathy. Lungs/Pleura: Moderate to large right and moderate left pleural effusions.Centrilobular and paraseptal emphysema evident. Consolidative opacity is seen in both upper lobes. This is associated with volume loss and collapse in the right middle lobe and both lower lobes. Musculoskeletal: Sclerosis in the manubrium noted. CT ABDOMEN PELVIS FINDINGS Hepatobiliary: No suspicious focal abnormality within the liver parenchyma. Calcified gallstones evident. No intrahepatic or extrahepatic biliary dilation. Pancreas: No focal mass lesion. No dilatation of the main duct. No intraparenchymal cyst. No peripancreatic edema. Spleen: Ill-defined hypoattenuating splenic lesion seen previously have resolved in the interval. Adrenals/Urinary Tract: No adrenal nodule or mass. Interval decrease in the infiltrative disease of the right kidney with some restoration of corticomedullary differentiation and right kidney is now more symmetric in size compared to the left. Left kidney remains unremarkable. No evidence for hydroureter. The urinary bladder appears normal for the degree of distention. Stomach/Bowel: Stomach is unremarkable. No gastric wall thickening. No evidence of outlet obstruction. Duodenum is normally positioned as is the ligament  of Treitz. No small bowel wall thickening. No small bowel dilatation. The terminal ileum is normal. The appendix is normal. No gross colonic mass. No colonic wall thickening. Vascular/Lymphatic: There is abdominal aortic atherosclerosis without aneurysm. Bulky retroperitoneal lymphadenopathy has decreased in the interval. The nodal periaortic conglomeration measured previously at 5.8 x 4.2 cm has essentially resolved with only some minimal periaortic soft tissue visible on the current study. Similar interval near complete resolution of the hepatoduodenal ligament lymphadenopathy. Lymphadenopathy seen along the common and external iliac vessels previously has essentially resolved. A 3.0 x 1.3 cm left external iliac node on 120/2 was 5.2 x 4.3 cm when remeasured in a similar fashion on the prior study. The bulky left greater than right groin lymphadenopathy seen previously has also decreased. Index left groin node measuring 1.9 x 1.8 cm on 01/30 3/2 today was 4.4 x 3.6 cm when remeasured in a similar fashion on the prior study. Reproductive: The prostate gland and seminal vesicles  are unremarkable. Other: Trace free fluid is noted in the peritoneal cavity. Musculoskeletal: Mixed lytic and sclerotic lesion in the posterior right iliac bone is stable. Diffuse body wall edema is new in the interval. IMPRESSION: 1. Marked interval decrease in size of the bulky abdominal and pelvic lymphadenopathy. Lymphadenopathy is completely resolved in some regions. No new or progressive lymphadenopathy on today's exam. 2. Interval decrease in the infiltrative disease of the right kidney with some restoration of corticomedullary differentiation. Right kidney is now more symmetric in size compared to the left. 3. Moderate to large right and moderate left pleural effusions with bilateral lower lobe collapse/consolidation. Pleural effusions are progressive in the interval. 4. Inflow of unopacified blood from the azygos vein in the SVC,  but fibrin sheath or small collection of thrombus associated with the tip of the Port-A-Cath in the SVC. 5. Cholelithiasis. 6. Trace free fluid in the peritoneal cavity. 7. Diffuse body wall edema, markedly progressive in the interval. 8. Aortic Atherosclerosis (ICD10-I70.0) and Emphysema (ICD10-J43.9). Electronically Signed   By: Misty Stanley M.D.   On: 08/18/2019 17:11   DG CHEST PORT 1 VIEW  Result Date: 09/03/2019 CLINICAL DATA:  Status post thoracentesis. EXAM: PORTABLE CHEST 1 VIEW COMPARISON:  09/03/2019 FINDINGS: Interval near complete evacuation of the right pleural fluid collection. No postprocedural pneumothorax is identified. Persistent underlying emphysematous changes, mild interstitial edema and patchy areas of atelectasis. Small left pleural effusion persists. IMPRESSION: Status post right thoracentesis with near complete evacuation of the right pleural fluid collection. No postprocedural pneumothorax. Electronically Signed   By: Marijo Sanes M.D.   On: 09/03/2019 14:58   DG CHEST PORT 1 VIEW  Result Date: 09/03/2019 CLINICAL DATA:  Pneumonia EXAM: PORTABLE CHEST 1 VIEW COMPARISON:  08/31/2019 FINDINGS: Cardiomegaly with moderate interstitial edema. Moderate right and small left pleural effusions. No pneumothorax. Additional chronic scarring in the bilateral upper lobes. Right chest power port terminates cavoatrial junction. IMPRESSION: Cardiomegaly with moderate interstitial edema. Moderate right and small left pleural effusions. Electronically Signed   By: Julian Hy M.D.   On: 09/03/2019 09:51   DG CHEST PORT 1 VIEW  Result Date: 08/31/2019 CLINICAL DATA:  Acute respiratory distress EXAM: PORTABLE CHEST 1 VIEW COMPARISON:  08/28/2019 FINDINGS: Lung volumes are small and pulmonary insufflation has diminished since prior examination. Large right and at least small left pleural effusions are present, enlarged since prior examination. There is progressive largely perihilar  interstitial infiltrate noted within the aerated lungs bilaterally, most in keeping with progressive pulmonary edema, possibly noncardiogenic in nature, though infectious or inflammatory infiltrate could appear similarly. No pneumothorax. Right internal jugular chest port is in place with its tip within the right atrium. Cardiac size within normal limits. No acute bone abnormality. IMPRESSION: 1. Interval increase in size of bilateral pleural effusions, large right and at least small left. 2. Progressive bilateral perihilar interstitial infiltrates, most in keeping with progressive pulmonary edema, though infectious or inflammatory infiltrate could appear similarly. 3. Diminished lung volumes. Electronically Signed   By: Fidela Salisbury MD   On: 08/31/2019 21:06   IR IMAGING GUIDED PORT INSERTION  Result Date: 08/13/2019 CLINICAL DATA:  History diffuse large B-cell lymphoma and need for porta cath for chemotherapy. EXAM: IMPLANTED PORT A CATH PLACEMENT WITH ULTRASOUND AND FLUOROSCOPIC GUIDANCE ANESTHESIA/SEDATION: 1.0 mg IV Versed; 25 mcg IV Fentanyl Total Moderate Sedation Time:  33 minutes The patient's level of consciousness and physiologic status were continuously monitored during the procedure by Radiology nursing. Additional Medications: 2 g IV  Ancef. FLUOROSCOPY TIME:  18 seconds.  5.0 mGy. PROCEDURE: The procedure, risks, benefits, and alternatives were explained to the patient. Questions regarding the procedure were encouraged and answered. The patient understands and consents to the procedure. A time-out was performed prior to initiating the procedure. Ultrasound was utilized to confirm patency of the right internal jugular vein. The right neck and chest were prepped with chlorhexidine in a sterile fashion, and a sterile drape was applied covering the operative field. Maximum barrier sterile technique with sterile gowns and gloves were used for the procedure. Local anesthesia was provided with 1%  lidocaine. After creating a small venotomy incision, a 21 gauge needle was advanced into the right internal jugular vein under direct, real-time ultrasound guidance. Ultrasound image documentation was performed. After securing guidewire access, an 8 Fr dilator was placed. A J-wire was kinked to measure appropriate catheter length. A subcutaneous port pocket was then created along the upper chest wall utilizing sharp and blunt dissection. Portable cautery was utilized. The pocket was irrigated with sterile saline. A single lumen power injectable port was chosen for placement. The 8 Fr catheter was tunneled from the port pocket site to the venotomy incision. The port was placed in the pocket. External catheter was trimmed to appropriate length based on guidewire measurement. At the venotomy, an 8 Fr peel-away sheath was placed over a guidewire. The catheter was then placed through the sheath and the sheath removed. Final catheter positioning was confirmed and documented with a fluoroscopic spot image. The port was accessed with a needle and aspirated and flushed with heparinized saline. The access needle was removed. The venotomy and port pocket incisions were closed with subcutaneous 3-0 Monocryl and subcuticular 4-0 Vicryl. Dermabond was applied to both incisions. COMPLICATIONS: COMPLICATIONS None FINDINGS: After catheter placement, the tip lies at the cavo-atrial junction. The catheter aspirates normally and is ready for immediate use. IMPRESSION: Placement of single lumen port a cath via right internal jugular vein. The catheter tip lies at the cavo-atrial junction. A power injectable port a cath was placed and is ready for immediate use. Electronically Signed   By: Aletta Edouard M.D.   On: 08/13/2019 11:32   VAS [place] UPPER EXTREMITY VENOUS DUPLEX  Result Date: 08/29/2019 UPPER VENOUS STUDY  Indications: Edema Comparison Study: no prior Performing Technologist: Abram Sander RVS  Examination Guidelines: A  complete evaluation includes B-mode imaging, spectral Doppler, color Doppler, and power Doppler as needed of all accessible portions of each vessel. Bilateral testing is considered an integral part of a complete examination. Limited examinations for reoccurring indications may be performed as noted.  Right Findings: +----------+------------+---------+-----------+----------+-------+ RIGHT     CompressiblePhasicitySpontaneousPropertiesSummary +----------+------------+---------+-----------+----------+-------+ IJV           Full       Yes       Yes                      +----------+------------+---------+-----------+----------+-------+ Subclavian    None       No        No                Acute  +----------+------------+---------+-----------+----------+-------+ Axillary      None       No        No                Acute  +----------+------------+---------+-----------+----------+-------+ Brachial      None       No  No                Acute  +----------+------------+---------+-----------+----------+-------+ Radial        Full                                          +----------+------------+---------+-----------+----------+-------+ Ulnar         Full                                          +----------+------------+---------+-----------+----------+-------+ Cephalic      Full                                          +----------+------------+---------+-----------+----------+-------+ Basilic       Full                                          +----------+------------+---------+-----------+----------+-------+  Summary:  Right: 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  *See table(s) above for measurements and observations.  Diagnosing physician: Curt Jews MD Electronically signed by Curt Jews MD on 08/29/2019 at 1:43:58 PM.    Final       Subjective: Patient seen  and examined bedside, resting comfortably.  Oxygenating well on 2 L nasal cannula.  Continues to be weak and fatigued.  Unfortunately social work unable to find home health agency to support home health services given his insurance.  Patient continues to decline SNF placement and wishes to return home.  Patient states his family is able to manage him with the use of a wheelchair at home.  No other complaints or concerns at this time.  Denies headache, no fever/chills/night sweats, no nausea/vomiting/diarrhea, no chest pain, palpitations, no abdominal pain.  No acute events overnight per nurse staff.  Discharge Exam: Vitals:   09/09/19 0756 09/09/19 0800  BP:  (!) 94/59  Pulse:  71  Resp:  18  Temp:  (!) 97.5 F (36.4 C)  SpO2: 96% 100%   Vitals:   09/09/19 0700 09/09/19 0725 09/09/19 0756 09/09/19 0800  BP: (!) 94/59 97/60  (!) 94/59  Pulse: 64 69  71  Resp: 18 16  18   Temp: 98.2 F (36.8 C) 98.2 F (36.8 C)  (!) 97.5 F (36.4 C)  TempSrc: Oral Oral  Oral  SpO2: 96% 97% 96% 100%  Weight:        General: Pt is alert, awake, not in acute distress, thin/cachectic/chronically ill in appearance Cardiovascular: RRR, S1/S2 +, no rubs, no gallops Respiratory: CTA bilaterally, no wheezing, no rhonchi nasal cannula with SPO2 97% Abdominal: Soft, NT, ND, bowel sounds + Extremities: 1+ edema bilaterally lower extremities to thigh, no cyanosis    The results of significant diagnostics from this hospitalization (including imaging, microbiology, ancillary and laboratory) are listed below for reference.     Microbiology: Recent Results (from the past 240 hour(s))  Body fluid culture     Status: None   Collection Time: 09/03/19  3:00 PM   Specimen: Fluid  Result Value Ref Range Status  Specimen Description FLUID PLEURAL RIGHT  Final   Special Requests NONE  Final   Gram Stain   Final    RARE WBC PRESENT, PREDOMINANTLY MONONUCLEAR NO ORGANISMS SEEN    Culture   Final    NO GROWTH 3  DAYS Performed at [facility], [place] [address], [place], [place] [address]    Report Status 09/07/2019 FINAL  Final     Labs: BNP (last 3 results) Recent Labs    07/13/19 0200 07/14/19 0500 07/16/19 0629  BNP 608.0* 608.8* 025.8*   Basic Metabolic Panel: Recent Labs  Lab 09/03/19 0415 09/03/19 1820 09/05/19 0535 09/06/19 0505 09/07/19 0227 09/08/19 0500 09/09/19 0200  NA 137   < > 136 136 133* 133* 135  K 3.1*   < > 3.3* 5.0 4.1 4.1 3.7  CL 96*   < > 88* 87* 87* 87* 88*  CO2 32   < > 42* 42* 38* 38* 41*  GLUCOSE 86   < > 85 90 105* 85 89  BUN 19   < > 18 18 21 22 20   CREATININE 1.16   < > 1.04 1.12 0.99 0.92 0.98  CALCIUM 7.5*   < > 7.2* 7.5* 7.5* 7.5* 7.7*  MG 1.8  --   --  1.7 1.6* 1.7 2.3   < > = values in this interval not displayed.   Liver Function Tests: Recent Labs  Lab 09/03/19 1820  AST 27  ALT 16  ALKPHOS 90  BILITOT 1.6*  PROT 4.1*  ALBUMIN 1.9*   No results for input(s): LIPASE, AMYLASE in the last 168 hours. No results for input(s): AMMONIA in the last 168 hours. CBC: Recent Labs  Lab 09/04/19 1122 09/05/19 0535 09/06/19 0505 09/08/19 0500 09/09/19 0200  WBC 6.9 6.3 8.1 6.8 7.4  HGB 9.5* 8.8* 8.5* 8.2* 8.2*  HCT 31.3* 29.4* 29.2* 27.9* 28.0*  MCV 92.9 94.2 95.1 96.9 95.2  PLT 76* 79* 78* 75* 84*   Cardiac Enzymes: No results for input(s): CKTOTAL, CKMB, CKMBINDEX, TROPONINI in the last 168 hours. BNP: Invalid input(s): POCBNP CBG: No results for input(s): GLUCAP in the last 168 hours. D-Dimer No results for input(s): DDIMER in the last 72 hours. Hgb A1c No results for input(s): HGBA1C in the last 72 hours. Lipid Profile No results for input(s): CHOL, HDL, LDLCALC, TRIG, CHOLHDL, LDLDIRECT in the last 72 hours. Thyroid function studies No results for input(s): TSH, T4TOTAL, T3FREE, THYROIDAB in the last 72 hours.  Invalid input(s): FREET3 Anemia work up No results for input(s): VITAMINB12, FOLATE, FERRITIN, TIBC, IRON,  RETICCTPCT in the last 72 hours. Urinalysis    Component Value Date/Time   COLORURINE AMBER (A) 08/28/2019 0142   APPEARANCEUR HAZY (A) 08/28/2019 0142   LABSPEC 1.021 08/28/2019 0142   PHURINE 5.0 08/28/2019 0142   GLUCOSEU NEGATIVE 08/28/2019 0142   HGBUR LARGE (A) 08/28/2019 0142   BILIRUBINUR NEGATIVE 08/28/2019 0142   KETONESUR NEGATIVE 08/28/2019 0142   PROTEINUR 100 (A) 08/28/2019 0142   NITRITE NEGATIVE 08/28/2019 0142   LEUKOCYTESUR NEGATIVE 08/28/2019 0142   Sepsis Labs Invalid input(s): PROCALCITONIN,  WBC,  LACTICIDVEN Microbiology Recent Results (from the past 240 hour(s))  Body fluid culture     Status: None   Collection Time: 09/03/19  3:00 PM   Specimen: Fluid  Result Value Ref Range Status   Specimen Description FLUID PLEURAL RIGHT  Final   Special Requests NONE  Final   Gram Stain   Final  RARE WBC PRESENT, PREDOMINANTLY MONONUCLEAR NO ORGANISMS SEEN    Culture   Final    NO GROWTH 3 DAYS Performed at [facility], [place] [address], [place], [place] [address]    Report Status 09/07/2019 FINAL  Final     Time coordinating discharge: Over 30 minutes  SIGNED:   Latia Mataya J [place] DO  [facility] 09/09/2019, 9:47 AM

## 2019-09-09 NOTE — Plan of Care (Signed)
  Problem: Education: Goal: Knowledge of General Education information will improve Description: Including pain rating scale, medication(s)/side effects and non-pharmacologic comfort measures Outcome: Completed/Met   Problem: Health Behavior/Discharge Planning: Goal: Ability to manage health-related needs will improve Outcome: Completed/Met   Problem: Clinical Measurements: Goal: Ability to maintain clinical measurements within normal limits will improve Outcome: Completed/Met Goal: Will remain free from infection Outcome: Completed/Met Goal: Diagnostic test results will improve Outcome: Completed/Met Goal: Respiratory complications will improve Outcome: Completed/Met Goal: Cardiovascular complication will be avoided Outcome: Completed/Met   Problem: Activity: Goal: Risk for activity intolerance will decrease Outcome: Completed/Met   Problem: Nutrition: Goal: Adequate nutrition will be maintained Outcome: Completed/Met   Problem: Coping: Goal: Level of anxiety will decrease Outcome: Completed/Met   Problem: Elimination: Goal: Will not experience complications related to bowel motility Outcome: Completed/Met Goal: Will not experience complications related to urinary retention Outcome: Completed/Met   Problem: Pain Managment: Goal: General experience of comfort will improve Outcome: Completed/Met   Problem: Safety: Goal: Ability to remain free from injury will improve Outcome: Completed/Met   Problem: Skin Integrity: Goal: Risk for impaired skin integrity will decrease Outcome: Completed/Met   Problem: Education: Goal: Expressions of having a comfortable level of knowledge regarding the disease process will increase Outcome: Completed/Met   Problem: Coping: Goal: Ability to adjust to condition or change in health will improve Outcome: Completed/Met Goal: Ability to identify appropriate support needs will improve Outcome: Completed/Met   Problem: Health  Behavior/Discharge Planning: Goal: Compliance with prescribed medication regimen will improve Outcome: Completed/Met   Problem: Medication: Goal: Risk for medication side effects will decrease Outcome: Completed/Met   Problem: Clinical Measurements: Goal: Complications related to the disease process, condition or treatment will be avoided or minimized Outcome: Completed/Met Goal: Diagnostic test results will improve Outcome: Completed/Met   Problem: Safety: Goal: Verbalization of understanding the information provided will improve Outcome: Completed/Met   Problem: Self-Concept: Goal: Level of anxiety will decrease Outcome: Completed/Met Goal: Ability to verbalize feelings about condition will improve Outcome: Completed/Met   Problem: Acute Rehab PT Goals(only PT should resolve) Goal: Pt Will Go Supine/Side To Sit Outcome: Completed/Met Goal: Patient Will Perform Sitting Balance Outcome: Completed/Met Goal: Patient Will Transfer Sit To/From Stand Outcome: Completed/Met Goal: Pt Will Transfer Bed To Chair/Chair To Bed Outcome: Completed/Met Goal: Pt Will Ambulate Outcome: Completed/Met

## 2019-09-09 NOTE — Progress Notes (Signed)
Pt got discharged to home, discharge instructions provided and patient showed understanding to it, IV taken out,Telemonitor DC,pt left unit in wheelchair with all of the belongings accompanied with a family member (wife)  Michalene Debruler,RN 

## 2019-09-10 ENCOUNTER — Telehealth: Payer: Self-pay | Admitting: Internal Medicine

## 2019-09-10 ENCOUNTER — Inpatient Hospital Stay: Payer: No Typology Code available for payment source

## 2019-09-10 ENCOUNTER — Telehealth: Payer: Self-pay

## 2019-09-10 ENCOUNTER — Telehealth: Payer: Self-pay | Admitting: Oncology

## 2019-09-10 ENCOUNTER — Inpatient Hospital Stay: Payer: No Typology Code available for payment source | Admitting: Oncology

## 2019-09-10 NOTE — Telephone Encounter (Signed)
Transition Care Management Follow-up Telephone Call  Date of discharge and from where: 09/09/2019 from [facility]   How have you been since you were released from the hospital? Has had diarrhea every 2 hours and is very lethargic   Any questions or concerns? Yes, would like to know what else he can do for diarrhea as he has tried immodium.  Items Reviewed:  Did the pt receive and understand the discharge instructions provided? Yes   Medications obtained and verified? Yes   Any new allergies since your discharge? No   Dietary orders reviewed? Yes  Do you have support at home? Yes , wife lives at home with him.  Functional Questionnaire: (I = Independent and D = Dependent) ADLs: I  Bathing/Dressing- I  Meal Prep- I  Eating- D Maintaining continence- I  Transferring/Ambulation- D  Managing Meds- D  Follow up appointments reviewed:   PCP Hospital f/u appt confirmed? No .  [facility] f/u appt confirmed? No    Are transportation arrangements needed? No   If their condition worsens, is the pt aware to call PCP or go to the Emergency Dept.? Yes  Was the patient provided with contact information for the PCP's office or ED? Yes  Was to pt encouraged to call back with questions or concerns? Yes  Unable to schedule patient due to nothing being available until 22-Sep-2019

## 2019-09-10 NOTE — Telephone Encounter (Signed)
Patient's wife called in with c/o of continued diarrhea since before hospital discharge. States that patient is having lose stools every 2 hours that are completely liquid. Has tried imodium with no relief would like to know what else you recommend.

## 2019-09-10 NOTE — Telephone Encounter (Signed)
TC from above Pt.'s wife stating Pt had to cancel appointment today because he had diarrhea and couldn't sit for the treatment informed Pt that if the diarrhea subsides to return call to Dr. Alen Blew to see if appointments can be rescheduled

## 2019-09-10 NOTE — Telephone Encounter (Signed)
Called pt per 8/23 sch msg - left message for patient to call back to reschedule appt.

## 2019-09-11 ENCOUNTER — Telehealth: Payer: Self-pay | Admitting: Internal Medicine

## 2019-09-11 NOTE — Telephone Encounter (Signed)
Pt has had constant diarrhea from the hospital visit. She wants to know what to do to keep it down. She has been giving him ammodium. Swelling has returned  Pt has a hosp F/U visit 09/07  Please advise

## 2019-09-12 ENCOUNTER — Telehealth: Payer: Self-pay

## 2019-09-12 ENCOUNTER — Inpatient Hospital Stay: Payer: No Typology Code available for payment source

## 2019-09-12 ENCOUNTER — Other Ambulatory Visit: Payer: Self-pay | Admitting: Oncology

## 2019-09-12 ENCOUNTER — Telehealth: Payer: Self-pay | Admitting: Oncology

## 2019-09-12 MED ORDER — DIPHENOXYLATE-ATROPINE 2.5-0.025 MG PO TABS: 1.0000 | ORAL_TABLET | Freq: Four times a day (QID) | ORAL | 0 refills | Status: AC | PRN

## 2019-09-12 NOTE — Telephone Encounter (Signed)
Attempted to call but unable to leave a message. 

## 2019-09-12 NOTE — Telephone Encounter (Signed)
Called pt per 8/24 sch msg- pt wife Is aware of appt on 8/30

## 2019-09-12 NOTE — Progress Notes (Signed)
I reviewed note and agree with plan.   Penni Bombard, MD 09/11/2351, 61:44 AM Certified in Neurology, Neurophysiology and Neuroimaging  [facility] [address], [place], [place] [address] [phone]

## 2019-09-12 NOTE — Telephone Encounter (Signed)
Called patient's wife back. Informed her Dr Alen Blew sent in a prescription. She verbalized understanding. Knows to call the office back with any other concerns.

## 2019-09-12 NOTE — Telephone Encounter (Signed)
-----   Message from Wyatt Portela, MD sent at 09/12/2019  4:06 PM EDT ----- I will send lomotil. Thanks ----- Message ----- From: Kennedy Bucker, LPN Sent: 08/06/9104   3:53 PM EDT To: Wyatt Portela, MD  Patient's wife called stating patient has been having diarrhea for the past week and canceled chemo appointment last week because of it. She states patient has tried [employer], and imodium and neither has helped. She would like to know if you could prescribe something other than imodium.    Kim LPN

## 2019-09-16 ENCOUNTER — Encounter (HOSPITAL_COMMUNITY): Payer: Self-pay

## 2019-09-16 ENCOUNTER — Emergency Department (HOSPITAL_COMMUNITY): Payer: No Typology Code available for payment source

## 2019-09-16 ENCOUNTER — Inpatient Hospital Stay (HOSPITAL_COMMUNITY)
Admission: EM | Admit: 2019-09-16 | Discharge: 2019-10-19 | DRG: 871 | Disposition: E | Payer: No Typology Code available for payment source | Attending: Internal Medicine | Admitting: Internal Medicine

## 2019-09-16 DIAGNOSIS — R570 Cardiogenic shock: Secondary | ICD-10-CM | POA: Diagnosis present

## 2019-09-16 DIAGNOSIS — Z6825 Body mass index (BMI) 25.0-25.9, adult: Secondary | ICD-10-CM

## 2019-09-16 DIAGNOSIS — R52 Pain, unspecified: Secondary | ICD-10-CM | POA: Diagnosis not present

## 2019-09-16 DIAGNOSIS — Z9981 Dependence on supplemental oxygen: Secondary | ICD-10-CM

## 2019-09-16 DIAGNOSIS — D849 Immunodeficiency, unspecified: Secondary | ICD-10-CM | POA: Diagnosis present

## 2019-09-16 DIAGNOSIS — Z452 Encounter for adjustment and management of vascular access device: Secondary | ICD-10-CM

## 2019-09-16 DIAGNOSIS — G9341 Metabolic encephalopathy: Secondary | ICD-10-CM

## 2019-09-16 DIAGNOSIS — N179 Acute kidney failure, unspecified: Secondary | ICD-10-CM | POA: Diagnosis present

## 2019-09-16 DIAGNOSIS — I340 Nonrheumatic mitral (valve) insufficiency: Secondary | ICD-10-CM | POA: Diagnosis present

## 2019-09-16 DIAGNOSIS — E43 Unspecified severe protein-calorie malnutrition: Secondary | ICD-10-CM | POA: Diagnosis present

## 2019-09-16 DIAGNOSIS — Z87891 Personal history of nicotine dependence: Secondary | ICD-10-CM

## 2019-09-16 DIAGNOSIS — Z7951 Long term (current) use of inhaled steroids: Secondary | ICD-10-CM

## 2019-09-16 DIAGNOSIS — I4892 Unspecified atrial flutter: Secondary | ICD-10-CM | POA: Diagnosis not present

## 2019-09-16 DIAGNOSIS — Z66 Do not resuscitate: Secondary | ICD-10-CM | POA: Diagnosis present

## 2019-09-16 DIAGNOSIS — J440 Chronic obstructive pulmonary disease with acute lower respiratory infection: Secondary | ICD-10-CM | POA: Diagnosis present

## 2019-09-16 DIAGNOSIS — Z20822 Contact with and (suspected) exposure to covid-19: Secondary | ICD-10-CM | POA: Diagnosis present

## 2019-09-16 DIAGNOSIS — D638 Anemia in other chronic diseases classified elsewhere: Secondary | ICD-10-CM | POA: Diagnosis present

## 2019-09-16 DIAGNOSIS — R54 Age-related physical debility: Secondary | ICD-10-CM | POA: Diagnosis present

## 2019-09-16 DIAGNOSIS — A419 Sepsis, unspecified organism: Secondary | ICD-10-CM | POA: Diagnosis not present

## 2019-09-16 DIAGNOSIS — Z9221 Personal history of antineoplastic chemotherapy: Secondary | ICD-10-CM

## 2019-09-16 DIAGNOSIS — E872 Acidosis, unspecified: Secondary | ICD-10-CM | POA: Diagnosis present

## 2019-09-16 DIAGNOSIS — R197 Diarrhea, unspecified: Secondary | ICD-10-CM | POA: Diagnosis present

## 2019-09-16 DIAGNOSIS — Z955 Presence of coronary angioplasty implant and graft: Secondary | ICD-10-CM

## 2019-09-16 DIAGNOSIS — Z79899 Other long term (current) drug therapy: Secondary | ICD-10-CM

## 2019-09-16 DIAGNOSIS — E785 Hyperlipidemia, unspecified: Secondary | ICD-10-CM | POA: Diagnosis present

## 2019-09-16 DIAGNOSIS — J9621 Acute and chronic respiratory failure with hypoxia: Secondary | ICD-10-CM | POA: Diagnosis present

## 2019-09-16 DIAGNOSIS — G9340 Encephalopathy, unspecified: Secondary | ICD-10-CM

## 2019-09-16 DIAGNOSIS — C8330 Diffuse large B-cell lymphoma, unspecified site: Secondary | ICD-10-CM | POA: Diagnosis present

## 2019-09-16 DIAGNOSIS — L8990 Pressure ulcer of unspecified site, unspecified stage: Secondary | ICD-10-CM | POA: Diagnosis present

## 2019-09-16 DIAGNOSIS — Z8249 Family history of ischemic heart disease and other diseases of the circulatory system: Secondary | ICD-10-CM

## 2019-09-16 DIAGNOSIS — I4819 Other persistent atrial fibrillation: Secondary | ICD-10-CM | POA: Diagnosis present

## 2019-09-16 DIAGNOSIS — I255 Ischemic cardiomyopathy: Secondary | ICD-10-CM | POA: Diagnosis present

## 2019-09-16 DIAGNOSIS — I2510 Atherosclerotic heart disease of native coronary artery without angina pectoris: Secondary | ICD-10-CM | POA: Diagnosis present

## 2019-09-16 DIAGNOSIS — R1110 Vomiting, unspecified: Secondary | ICD-10-CM

## 2019-09-16 DIAGNOSIS — I82409 Acute embolism and thrombosis of unspecified deep veins of unspecified lower extremity: Secondary | ICD-10-CM | POA: Diagnosis present

## 2019-09-16 DIAGNOSIS — Z7189 Other specified counseling: Secondary | ICD-10-CM

## 2019-09-16 DIAGNOSIS — Z7952 Long term (current) use of systemic steroids: Secondary | ICD-10-CM

## 2019-09-16 DIAGNOSIS — Z923 Personal history of irradiation: Secondary | ICD-10-CM

## 2019-09-16 DIAGNOSIS — Z6824 Body mass index (BMI) 24.0-24.9, adult: Secondary | ICD-10-CM

## 2019-09-16 DIAGNOSIS — Z7901 Long term (current) use of anticoagulants: Secondary | ICD-10-CM

## 2019-09-16 DIAGNOSIS — R627 Adult failure to thrive: Secondary | ICD-10-CM | POA: Diagnosis present

## 2019-09-16 DIAGNOSIS — R410 Disorientation, unspecified: Secondary | ICD-10-CM

## 2019-09-16 DIAGNOSIS — I5043 Acute on chronic combined systolic (congestive) and diastolic (congestive) heart failure: Secondary | ICD-10-CM | POA: Diagnosis present

## 2019-09-16 DIAGNOSIS — J189 Pneumonia, unspecified organism: Secondary | ICD-10-CM | POA: Diagnosis present

## 2019-09-16 DIAGNOSIS — G40109 Localization-related (focal) (partial) symptomatic epilepsy and epileptic syndromes with simple partial seizures, not intractable, without status epilepticus: Secondary | ICD-10-CM

## 2019-09-16 DIAGNOSIS — R599 Enlarged lymph nodes, unspecified: Secondary | ICD-10-CM | POA: Diagnosis present

## 2019-09-16 DIAGNOSIS — L89322 Pressure ulcer of left buttock, stage 2: Secondary | ICD-10-CM | POA: Diagnosis present

## 2019-09-16 DIAGNOSIS — R0602 Shortness of breath: Secondary | ICD-10-CM

## 2019-09-16 DIAGNOSIS — G40909 Epilepsy, unspecified, not intractable, without status epilepticus: Secondary | ICD-10-CM | POA: Diagnosis present

## 2019-09-16 DIAGNOSIS — F05 Delirium due to known physiological condition: Secondary | ICD-10-CM | POA: Diagnosis present

## 2019-09-16 DIAGNOSIS — T17908A Unspecified foreign body in respiratory tract, part unspecified causing other injury, initial encounter: Secondary | ICD-10-CM

## 2019-09-16 DIAGNOSIS — I252 Old myocardial infarction: Secondary | ICD-10-CM

## 2019-09-16 DIAGNOSIS — R64 Cachexia: Secondary | ICD-10-CM | POA: Diagnosis present

## 2019-09-16 DIAGNOSIS — Z711 Person with feared health complaint in whom no diagnosis is made: Secondary | ICD-10-CM

## 2019-09-16 DIAGNOSIS — Y95 Nosocomial condition: Secondary | ICD-10-CM | POA: Diagnosis present

## 2019-09-16 DIAGNOSIS — R6521 Severe sepsis with septic shock: Secondary | ICD-10-CM | POA: Diagnosis present

## 2019-09-16 DIAGNOSIS — Z515 Encounter for palliative care: Secondary | ICD-10-CM

## 2019-09-16 DIAGNOSIS — Z86718 Personal history of other venous thrombosis and embolism: Secondary | ICD-10-CM

## 2019-09-16 LAB — COMPREHENSIVE METABOLIC PANEL
ALT: 19 U/L (ref 0–44)
AST: 26 U/L (ref 15–41)
Albumin: 2.5 g/dL — ABNORMAL LOW (ref 3.5–5.0)
Alkaline Phosphatase: 86 U/L (ref 38–126)
Anion gap: 14 (ref 5–15)
BUN: 19 mg/dL (ref 8–23)
CO2: 31 mmol/L (ref 22–32)
Calcium: 8.5 mg/dL — ABNORMAL LOW (ref 8.9–10.3)
Chloride: 92 mmol/L — ABNORMAL LOW (ref 98–111)
Creatinine, Ser: 1.42 mg/dL — ABNORMAL HIGH (ref 0.61–1.24)
GFR calc Af Amer: >60 mL/min (ref >60)
GFR calc non Af Amer: 53 mL/min — ABNORMAL LOW (ref >60)
Glucose, Bld: 70 mg/dL (ref 70–99)
Potassium: 4.7 mmol/L (ref 3.5–5.1)
Sodium: 137 mmol/L (ref 135–145)
Total Bilirubin: 0.6 mg/dL (ref 0.3–1.2)
Total Protein: 4.7 g/dL — ABNORMAL LOW (ref 6.5–8.1)

## 2019-09-16 LAB — CBC WITH DIFFERENTIAL/PLATELET
Abs Immature Granulocytes: 0.17 10*3/uL — ABNORMAL HIGH (ref 0.00–0.07)
Basophils Absolute: 0 10*3/uL (ref 0.0–0.1)
Basophils Relative: 0 %
Eosinophils Absolute: 0 10*3/uL (ref 0.0–0.5)
Eosinophils Relative: 0 %
HCT: 33.7 % — ABNORMAL LOW (ref 39.0–52.0)
Hemoglobin: 10.1 g/dL — ABNORMAL LOW (ref 13.0–17.0)
Immature Granulocytes: 1 %
Lymphocytes Relative: 3 %
Lymphs Abs: 0.5 10*3/uL — ABNORMAL LOW (ref 0.7–4.0)
MCH: 28.2 pg (ref 26.0–34.0)
MCHC: 30 g/dL (ref 30.0–36.0)
MCV: 94.1 fL (ref 80.0–100.0)
Monocytes Absolute: 0.2 10*3/uL (ref 0.1–1.0)
Monocytes Relative: 1 %
Neutro Abs: 14.4 10*3/uL — ABNORMAL HIGH (ref 1.7–7.7)
Neutrophils Relative %: 95 %
Platelets: 122 10*3/uL — ABNORMAL LOW (ref 150–400)
RBC: 3.58 MIL/uL — ABNORMAL LOW (ref 4.22–5.81)
RDW: 17.9 % — ABNORMAL HIGH (ref 11.5–15.5)
WBC: 15.3 10*3/uL — ABNORMAL HIGH (ref 4.0–10.5)
nRBC: 0 % (ref 0.0–0.2)

## 2019-09-16 LAB — BRAIN NATRIURETIC PEPTIDE: B Natriuretic Peptide: 753.3 pg/mL — ABNORMAL HIGH (ref 0.0–100.0)

## 2019-09-16 LAB — APTT: aPTT: 33 seconds (ref 24–36)

## 2019-09-16 LAB — SARS CORONAVIRUS 2 BY RT PCR (HOSPITAL ORDER, PERFORMED IN ~~LOC~~ HOSPITAL LAB): SARS Coronavirus 2: NEGATIVE

## 2019-09-16 LAB — PROTIME-INR
INR: 1.7 — ABNORMAL HIGH (ref 0.8–1.2)
Prothrombin Time: 19.2 seconds — ABNORMAL HIGH (ref 11.4–15.2)

## 2019-09-16 LAB — LACTIC ACID, PLASMA
Lactic Acid, Venous: 3 mmol/L (ref 0.5–1.9)
Lactic Acid, Venous: 1.5 mmol/L (ref 0.5–1.9)

## 2019-09-16 LAB — DIGOXIN LEVEL: Digoxin Level: 1.2 ng/mL (ref 0.8–2.0)

## 2019-09-16 MED ORDER — VANCOMYCIN HCL 1750 MG/350ML IV SOLN
1750.0000 mg | Freq: Once | INTRAVENOUS | Status: AC
  Administered 2019-09-16: 1750 mg via INTRAVENOUS
  Filled 2019-09-16: qty 350

## 2019-09-16 MED ORDER — SODIUM CHLORIDE 0.9 % IV SOLN
2.0000 g | Freq: Three times a day (TID) | INTRAVENOUS | Status: DC
  Administered 2019-09-17 – 2019-09-20 (×11): 2 g via INTRAVENOUS
  Filled 2019-09-16 (×11): qty 2

## 2019-09-16 MED ORDER — NOREPINEPHRINE 4 MG/250ML-% IV SOLN
0.0000 ug/min | INTRAVENOUS | Status: DC
  Administered 2019-09-16: 7 ug/min via INTRAVENOUS
  Administered 2019-09-17: 10 ug/min via INTRAVENOUS
  Filled 2019-09-16 (×4): qty 250

## 2019-09-16 MED ORDER — VANCOMYCIN HCL IN DEXTROSE 1-5 GM/200ML-% IV SOLN: 1000.0000 mg | Freq: Once | INTRAVENOUS | Status: DC

## 2019-09-16 MED ORDER — SODIUM CHLORIDE 0.9 % IV BOLUS
500.0000 mL | Freq: Once | INTRAVENOUS | Status: AC
  Administered 2019-09-16: 500 mL via INTRAVENOUS

## 2019-09-16 MED ORDER — METRONIDAZOLE IN NACL 5-0.79 MG/ML-% IV SOLN
500.0000 mg | Freq: Once | INTRAVENOUS | Status: AC
  Administered 2019-09-16: 500 mg via INTRAVENOUS
  Filled 2019-09-16: qty 100

## 2019-09-16 MED ORDER — SODIUM CHLORIDE 0.9 % IV SOLN
2.0000 g | Freq: Once | INTRAVENOUS | Status: AC
  Administered 2019-09-16: 2 g via INTRAVENOUS
  Filled 2019-09-16: qty 2

## 2019-09-16 MED ORDER — ACETAMINOPHEN 325 MG PO TABS
650.0000 mg | ORAL_TABLET | Freq: Once | ORAL | Status: AC | PRN
  Administered 2019-09-17: 650 mg via ORAL
  Filled 2019-09-16: qty 2

## 2019-09-16 MED ORDER — VANCOMYCIN HCL 750 MG/150ML IV SOLN
750.0000 mg | Freq: Two times a day (BID) | INTRAVENOUS | Status: DC
  Administered 2019-09-17 (×2): 750 mg via INTRAVENOUS
  Filled 2019-09-16 (×3): qty 150
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
## 2019-09-16 NOTE — ED Notes (Signed)
Date and time results received: 09/17/2019 2056 (use smartphrase ".now" to insert current time)  Test: lactic  Critical Value: 3.0  Name of Provider Notified: scott goldston  Orders Received? Or Actions Taken?: Orders Received - See Orders for details

## 2019-09-16 NOTE — Progress Notes (Signed)
Pharmacy Antibiotic Note  Tony Saunders is a 64 y.o. male admitted on 08/29/2019 with pneumonia and sepsis.  Pharmacy has been consulted for Cefepime and Vancomycin  dosing.   Height: 6' (182.9 cm) Weight: 81.5 kg (179 lb 10.8 oz) IBW/kg (Calculated) : 77.6  Temp (24hrs), Avg:98.4 F (36.9 C), Min:98.4 F (36.9 C), Max:98.4 F (36.9 C)  Recent Labs  Lab 08/28/2019 2006 08/27/2019 2021  WBC 15.3*  --   CREATININE 1.42*  --   LATICACIDVEN  --  3.0*    Estimated Creatinine Clearance: 59.2 mL/min (A) (by C-G formula based on SCr of 1.42 mg/dL (H)).    No Known Allergies  Antimicrobials this admission: 8/29 Cefepime >>  8/29 Vancomycin >>   Dose adjustments this admission: N/a  Microbiology results: Pending   Plan:  - Cefepime 2g IV q8h - Vancomycin 1750mg  IV x 1 dose  - Followed by Vancomycin 750mg  IV q12h (nomogram dosing)  - Goal trough ~15 - Monitor patients renal function and urine output  - De-escalate ABX when appropriate   Thank you for allowing pharmacy to be a part of this patient's care.  Duanne Limerick PharmD. BCPS 09/14/2019 9:41 PM

## 2019-09-16 NOTE — ED Triage Notes (Signed)
Assume care from EMS per EMS Pt have new onset of fevers and AMS and wears a baseline of 2L However pt o2 sats were 75%, EMS place pt on non rebreather mask. EMS reported pt has hx of lymphoma and chf and receive active chemo treatments last treatment was last week

## 2019-09-17 ENCOUNTER — Other Ambulatory Visit: Payer: No Typology Code available for payment source

## 2019-09-17 ENCOUNTER — Inpatient Hospital Stay (HOSPITAL_COMMUNITY): Payer: No Typology Code available for payment source

## 2019-09-17 ENCOUNTER — Ambulatory Visit: Payer: No Typology Code available for payment source | Admitting: Oncology

## 2019-09-17 ENCOUNTER — Ambulatory Visit: Payer: No Typology Code available for payment source

## 2019-09-17 DIAGNOSIS — R6521 Severe sepsis with septic shock: Secondary | ICD-10-CM

## 2019-09-17 DIAGNOSIS — R64 Cachexia: Secondary | ICD-10-CM | POA: Diagnosis present

## 2019-09-17 DIAGNOSIS — Z711 Person with feared health complaint in whom no diagnosis is made: Secondary | ICD-10-CM | POA: Diagnosis not present

## 2019-09-17 DIAGNOSIS — J9621 Acute and chronic respiratory failure with hypoxia: Secondary | ICD-10-CM | POA: Diagnosis present

## 2019-09-17 DIAGNOSIS — E43 Unspecified severe protein-calorie malnutrition: Secondary | ICD-10-CM

## 2019-09-17 DIAGNOSIS — R627 Adult failure to thrive: Secondary | ICD-10-CM | POA: Diagnosis present

## 2019-09-17 DIAGNOSIS — L89322 Pressure ulcer of left buttock, stage 2: Secondary | ICD-10-CM | POA: Diagnosis present

## 2019-09-17 DIAGNOSIS — J189 Pneumonia, unspecified organism: Secondary | ICD-10-CM | POA: Diagnosis present

## 2019-09-17 DIAGNOSIS — N179 Acute kidney failure, unspecified: Secondary | ICD-10-CM

## 2019-09-17 DIAGNOSIS — Z20822 Contact with and (suspected) exposure to covid-19: Secondary | ICD-10-CM | POA: Diagnosis present

## 2019-09-17 DIAGNOSIS — A419 Sepsis, unspecified organism: Principal | ICD-10-CM

## 2019-09-17 DIAGNOSIS — D849 Immunodeficiency, unspecified: Secondary | ICD-10-CM

## 2019-09-17 DIAGNOSIS — R579 Shock, unspecified: Secondary | ICD-10-CM | POA: Diagnosis not present

## 2019-09-17 DIAGNOSIS — C8330 Diffuse large B-cell lymphoma, unspecified site: Secondary | ICD-10-CM | POA: Diagnosis present

## 2019-09-17 DIAGNOSIS — I5043 Acute on chronic combined systolic (congestive) and diastolic (congestive) heart failure: Secondary | ICD-10-CM | POA: Diagnosis present

## 2019-09-17 DIAGNOSIS — R570 Cardiogenic shock: Secondary | ICD-10-CM | POA: Diagnosis present

## 2019-09-17 DIAGNOSIS — Z7189 Other specified counseling: Secondary | ICD-10-CM | POA: Diagnosis not present

## 2019-09-17 DIAGNOSIS — R197 Diarrhea, unspecified: Secondary | ICD-10-CM | POA: Diagnosis present

## 2019-09-17 DIAGNOSIS — G9341 Metabolic encephalopathy: Secondary | ICD-10-CM | POA: Diagnosis present

## 2019-09-17 DIAGNOSIS — I4892 Unspecified atrial flutter: Secondary | ICD-10-CM | POA: Diagnosis not present

## 2019-09-17 DIAGNOSIS — E872 Acidosis: Secondary | ICD-10-CM | POA: Diagnosis present

## 2019-09-17 DIAGNOSIS — Y95 Nosocomial condition: Secondary | ICD-10-CM

## 2019-09-17 DIAGNOSIS — Z515 Encounter for palliative care: Secondary | ICD-10-CM

## 2019-09-17 DIAGNOSIS — I4819 Other persistent atrial fibrillation: Secondary | ICD-10-CM | POA: Diagnosis present

## 2019-09-17 DIAGNOSIS — T17908A Unspecified foreign body in respiratory tract, part unspecified causing other injury, initial encounter: Secondary | ICD-10-CM | POA: Diagnosis not present

## 2019-09-17 DIAGNOSIS — Z66 Do not resuscitate: Secondary | ICD-10-CM | POA: Diagnosis present

## 2019-09-17 DIAGNOSIS — F05 Delirium due to known physiological condition: Secondary | ICD-10-CM | POA: Diagnosis present

## 2019-09-17 DIAGNOSIS — R52 Pain, unspecified: Secondary | ICD-10-CM | POA: Diagnosis present

## 2019-09-17 DIAGNOSIS — G9340 Encephalopathy, unspecified: Secondary | ICD-10-CM

## 2019-09-17 DIAGNOSIS — G40909 Epilepsy, unspecified, not intractable, without status epilepticus: Secondary | ICD-10-CM | POA: Diagnosis present

## 2019-09-17 DIAGNOSIS — J440 Chronic obstructive pulmonary disease with acute lower respiratory infection: Secondary | ICD-10-CM | POA: Diagnosis present

## 2019-09-17 LAB — I-STAT ARTERIAL BLOOD GAS, ED
Acid-Base Excess: 10 mmol/L — ABNORMAL HIGH (ref 0.0–2.0)
Acid-Base Excess: 10 mmol/L — ABNORMAL HIGH (ref 0.0–2.0)
Acid-Base Excess: 6 mmol/L — ABNORMAL HIGH (ref 0.0–2.0)
Bicarbonate: 33 mmol/L — ABNORMAL HIGH (ref 20.0–28.0)
Bicarbonate: 33.6 mmol/L — ABNORMAL HIGH (ref 20.0–28.0)
Bicarbonate: 30 mmol/L — ABNORMAL HIGH (ref 20.0–28.0)
Calcium, Ion: 1.1 mmol/L — ABNORMAL LOW (ref 1.15–1.40)
Calcium, Ion: 1.11 mmol/L — ABNORMAL LOW (ref 1.15–1.40)
Calcium, Ion: 1.08 mmol/L — ABNORMAL LOW (ref 1.15–1.40)
HCT: 24 % — ABNORMAL LOW (ref 39.0–52.0)
HCT: 25 % — ABNORMAL LOW (ref 39.0–52.0)
HCT: 26 % — ABNORMAL LOW (ref 39.0–52.0)
Hemoglobin: 8.2 g/dL — ABNORMAL LOW (ref 13.0–17.0)
Hemoglobin: 8.5 g/dL — ABNORMAL LOW (ref 13.0–17.0)
Hemoglobin: 8.8 g/dL — ABNORMAL LOW (ref 13.0–17.0)
O2 Saturation: 67 %
O2 Saturation: 76 %
O2 Saturation: 98 %
Patient temperature: 99
Patient temperature: 99
Patient temperature: 98.6
Potassium: 4.3 mmol/L (ref 3.5–5.1)
Potassium: 4.3 mmol/L (ref 3.5–5.1)
Potassium: 3.8 mmol/L (ref 3.5–5.1)
Sodium: 136 mmol/L (ref 135–145)
Sodium: 136 mmol/L (ref 135–145)
Sodium: 133 mmol/L — ABNORMAL LOW (ref 135–145)
TCO2: 34 mmol/L — ABNORMAL HIGH (ref 22–32)
TCO2: 35 mmol/L — ABNORMAL HIGH (ref 22–32)
TCO2: 31 mmol/L (ref 22–32)
pCO2 arterial: 39.4 mmHg (ref 32.0–48.0)
pCO2 arterial: 41.6 mmHg (ref 32.0–48.0)
pCO2 arterial: 39.1 mmHg (ref 32.0–48.0)
pH, Arterial: 7.516 — ABNORMAL HIGH (ref 7.350–7.450)
pH, Arterial: 7.533 — ABNORMAL HIGH (ref 7.350–7.450)
pH, Arterial: 7.493 — ABNORMAL HIGH (ref 7.350–7.450)
pO2, Arterial: 32 mmHg — CL (ref 83.0–108.0)
pO2, Arterial: 37 mmHg — CL (ref 83.0–108.0)
pO2, Arterial: 98 mmHg (ref 83.0–108.0)

## 2019-09-17 LAB — URINALYSIS, ROUTINE W REFLEX MICROSCOPIC
Bilirubin Urine: NEGATIVE
Glucose, UA: NEGATIVE mg/dL
Ketones, ur: NEGATIVE mg/dL
Leukocytes,Ua: NEGATIVE
Nitrite: NEGATIVE
Protein, ur: 100 mg/dL — AB
Specific Gravity, Urine: 1.018 (ref 1.005–1.030)
pH: 5 (ref 5.0–8.0)

## 2019-09-17 LAB — COOXEMETRY PANEL
Carboxyhemoglobin: 1.3 % (ref 0.5–1.5)
Methemoglobin: 0.7 % (ref 0.0–1.5)
O2 Saturation: 75.7 %
Total hemoglobin: 9 g/dL — ABNORMAL LOW (ref 12.0–16.0)

## 2019-09-17 LAB — GLUCOSE, CAPILLARY
Glucose-Capillary: 116 mg/dL — ABNORMAL HIGH (ref 70–99)
Glucose-Capillary: 125 mg/dL — ABNORMAL HIGH (ref 70–99)
Glucose-Capillary: 75 mg/dL (ref 70–99)
Glucose-Capillary: 95 mg/dL (ref 70–99)

## 2019-09-17 LAB — URINE CULTURE: Culture: NO GROWTH

## 2019-09-17 LAB — CBC
HCT: 28.9 % — ABNORMAL LOW (ref 39.0–52.0)
Hemoglobin: 8.7 g/dL — ABNORMAL LOW (ref 13.0–17.0)
MCH: 28.5 pg (ref 26.0–34.0)
MCHC: 30.1 g/dL (ref 30.0–36.0)
MCV: 94.8 fL (ref 80.0–100.0)
Platelets: 142 10*3/uL — ABNORMAL LOW (ref 150–400)
RBC: 3.05 MIL/uL — ABNORMAL LOW (ref 4.22–5.81)
RDW: 18.2 % — ABNORMAL HIGH (ref 11.5–15.5)
WBC: 45.2 10*3/uL — ABNORMAL HIGH (ref 4.0–10.5)
nRBC: 0 % (ref 0.0–0.2)

## 2019-09-17 LAB — MRSA PCR SCREENING: MRSA by PCR: NEGATIVE

## 2019-09-17 MED ORDER — SODIUM CHLORIDE 0.9 % IV SOLN: INTRAVENOUS | Status: DC | PRN

## 2019-09-17 MED ORDER — IPRATROPIUM-ALBUTEROL 0.5-2.5 (3) MG/3ML IN SOLN: 3.0000 mL | Freq: Four times a day (QID) | RESPIRATORY_TRACT | Status: DC | PRN

## 2019-09-17 MED ORDER — POLYETHYLENE GLYCOL 3350 17 G PO PACK: 17.0000 g | PACK | Freq: Every day | ORAL | Status: DC | PRN

## 2019-09-17 MED ORDER — ALBUMIN HUMAN 5 % IV SOLN
25.0000 g | Freq: Once | INTRAVENOUS | Status: AC
  Administered 2019-09-17: 25 g via INTRAVENOUS
  Filled 2019-09-17: qty 500

## 2019-09-17 MED ORDER — BUDESONIDE 0.5 MG/2ML IN SUSP
0.5000 mg | Freq: Two times a day (BID) | RESPIRATORY_TRACT | Status: DC
  Administered 2019-09-17 – 2019-09-20 (×7): 0.5 mg via RESPIRATORY_TRACT
  Filled 2019-09-17 (×9): qty 2

## 2019-09-17 MED ORDER — METRONIDAZOLE IN NACL 5-0.79 MG/ML-% IV SOLN
500.0000 mg | Freq: Three times a day (TID) | INTRAVENOUS | Status: DC
  Administered 2019-09-17 – 2019-09-19 (×7): 500 mg via INTRAVENOUS
  Filled 2019-09-17 (×7): qty 100

## 2019-09-17 MED ORDER — AMIODARONE HCL 200 MG PO TABS
200.0000 mg | ORAL_TABLET | Freq: Every day | ORAL | Status: DC
  Administered 2019-09-17 – 2019-09-19 (×3): 200 mg via ORAL
  Filled 2019-09-17 (×3): qty 1

## 2019-09-17 MED ORDER — OXYCODONE HCL 5 MG PO TABS
5.0000 mg | ORAL_TABLET | ORAL | Status: DC | PRN
  Administered 2019-09-17 – 2019-09-19 (×4): 5 mg via ORAL
  Filled 2019-09-17 (×4): qty 1

## 2019-09-17 MED ORDER — LACTATED RINGERS IV SOLN: INTRAVENOUS | Status: AC

## 2019-09-17 MED ORDER — ORAL CARE MOUTH RINSE
15.0000 mL | Freq: Two times a day (BID) | OROMUCOSAL | Status: DC
  Administered 2019-09-17 – 2019-09-20 (×5): 15 mL via OROMUCOSAL

## 2019-09-17 MED ORDER — MIDODRINE HCL 5 MG PO TABS
10.0000 mg | ORAL_TABLET | Freq: Three times a day (TID) | ORAL | Status: DC
  Administered 2019-09-17 – 2019-09-19 (×6): 10 mg via ORAL
  Filled 2019-09-17 (×6): qty 2

## 2019-09-17 MED ORDER — DOCUSATE SODIUM 100 MG PO CAPS: 100.0000 mg | ORAL_CAPSULE | Freq: Two times a day (BID) | ORAL | Status: DC | PRN

## 2019-09-17 MED ORDER — HYDROMORPHONE HCL 1 MG/ML IJ SOLN
0.5000 mg | INTRAMUSCULAR | Status: DC | PRN
  Administered 2019-09-17 – 2019-09-20 (×5): 0.5 mg via INTRAVENOUS
  Filled 2019-09-17 (×6): qty 0.5

## 2019-09-17 MED ORDER — FENTANYL CITRATE (PF) 100 MCG/2ML IJ SOLN
12.5000 ug | Freq: Once | INTRAMUSCULAR | Status: AC
  Administered 2019-09-17: 12.5 ug via INTRAVENOUS
  Filled 2019-09-17: qty 2

## 2019-09-17 MED ORDER — HYDROCORTISONE NA SUCCINATE PF 100 MG IJ SOLR
50.0000 mg | Freq: Four times a day (QID) | INTRAMUSCULAR | Status: DC
  Administered 2019-09-17 – 2019-09-20 (×13): 50 mg via INTRAVENOUS
  Filled 2019-09-17 (×13): qty 2

## 2019-09-17 MED ORDER — NOREPINEPHRINE 16 MG/250ML-% IV SOLN
0.0000 ug/min | INTRAVENOUS | Status: DC
  Administered 2019-09-17: 30 ug/min via INTRAVENOUS
  Administered 2019-09-18: 15 ug/min via INTRAVENOUS
  Filled 2019-09-17 (×3): qty 250

## 2019-09-17 MED ORDER — ALBUMIN HUMAN 25 % IV SOLN
50.0000 g | Freq: Once | INTRAVENOUS | Status: AC
  Administered 2019-09-17: 50 g via INTRAVENOUS
  Filled 2019-09-17: qty 200

## 2019-09-17 MED ORDER — APIXABAN 5 MG PO TABS
5.0000 mg | ORAL_TABLET | Freq: Two times a day (BID) | ORAL | Status: DC
  Administered 2019-09-17 – 2019-09-19 (×6): 5 mg via ORAL
  Filled 2019-09-17 (×6): qty 1

## 2019-09-17 MED ORDER — LAMOTRIGINE 100 MG PO TABS
100.0000 mg | ORAL_TABLET | Freq: Two times a day (BID) | ORAL | Status: DC
  Administered 2019-09-17 – 2019-09-19 (×6): 100 mg via ORAL
  Filled 2019-09-17 (×8): qty 1

## 2019-09-17 MED ORDER — VASOPRESSIN 20 UNITS/100 ML INFUSION FOR SHOCK
0.0000 [IU]/min | INTRAVENOUS | Status: DC
  Administered 2019-09-17 (×2): 0.03 [IU]/min via INTRAVENOUS
  Filled 2019-09-17 (×3): qty 100

## 2019-09-17 MED ORDER — CHLORHEXIDINE GLUCONATE CLOTH 2 % EX PADS
6.0000 | MEDICATED_PAD | Freq: Every day | CUTANEOUS | Status: DC
  Administered 2019-09-17 – 2019-09-19 (×2): 6 via TOPICAL

## 2019-09-17 MED ORDER — LEVETIRACETAM 750 MG PO TABS
750.0000 mg | ORAL_TABLET | Freq: Two times a day (BID) | ORAL | Status: DC
  Administered 2019-09-17 – 2019-09-19 (×6): 750 mg via ORAL
  Filled 2019-09-17 (×7): qty 1

## 2019-09-17 NOTE — Progress Notes (Signed)
eLink Physician-Brief Progress Note Patient Name: Khyri Hinzman DOB: 1955-09-06 MRN: [id number]   Date of Service  09/17/2019  HPI/Events of Note  Bleeding from arterial line site.  eICU Interventions  Check CBC, order to secure arterial line site.        [name] Maanasa Aderhold 09/17/2019, 9:55 PM

## 2019-09-17 NOTE — Plan of Care (Signed)
  Interdisciplinary Goals of Care Family Meeting   Date carried out:: 09/17/2019  Location of the meeting: Bedside  Member's involved: Physician and Family Member or next of kin  Durable Power of Attorney or acting medical decision maker: His wife [name]  Discussion: We discussed goals of care for Tony Saunders .  We discussed the advanced level of multiple chronic illnesses including systolic heart failure, lymphoma, severe mitral regurgitation, now complicated by septic shock.  I explained that should he reach the point of cardiac or respiratory arrest that the intervention of CPR or mechanical ventilation would provide no medical benefit as he is too weak to survive any of those interventions.  He and his wife voiced understanding and agreed to DNR status, but to continue full medical support.  Code status: Full DNR, continue vasopressor support as ordered  Disposition: Continue current acute care  Time spent for the meeting: 30 minutes  Roselie Awkward 09/17/2019, 12:24 PM

## 2019-09-17 NOTE — Progress Notes (Signed)
PCCM interval progress note:   Pt up to 55mcg Levophed, stress dose steroids started and will add Vaso.  He has a PICC line, but may need CVC.  Family would like to hold off placing right now unless absolutely necessary.   Over the last few hours his LLE extremity has become increasingly erythematous and pt now complaining of severe pain in the leg.  Unlikely DVT given he is on Eliquis.  Will check plain films to ensure no obvious subcutaneous gas.  Otilio Carpen Zyann Mabry, PA-C

## 2019-09-17 NOTE — Consult Note (Signed)
Consultation Note Date: 09/17/2019   Patient Name: Tony Saunders  DOB: Aug 21, 1955  MRN: [id number]  Age / Sex: 64 y.o., male  PCP: [name], [name], MD Referring Physician: Juanito Doom, MD  Reason for Consultation: Establishing goals of care  HPI/Patient Profile: 64 y.o. male  with B-cell lymphoma receiving active chemotherapy presented to the [facility] emergency department on 09/10/2019 with fever, AMS, and diarrhea. He was recently admitted for acute CHF and right pleural effusion, discharged 8/22. Patient was hypotensive in the ED, requiring vasopressors, admitted to [facility] for management of septic shock secondary to possible pneumonia +/- C-diff.   Pertinent labs in the ED include WBC-15.3, BNP-753, lactic acid-3.0, creatinine 1.42, albumin-2.5. Chest x-ray shows increased airspace opacities throughout the right lung suspicious for infection or possible asymmetric edema and small right pleural effusion.   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 Alen Blew, status post radiation, on chemotherapy 4/6 cycles - chronic systolic and diastolic CHF (EF 09-73% on echo June 2021)  - CAD, recent MI (March 2021), status post LAD stent placement - persistent A-flutter - severe mitral regurgitation - Right upper extremity DVT - seizure disorder  Next of kin: Corrigan Kretschmer (spouse) [phone]  Clinical Assessment and Goals of Care: I have reviewed medical records including EPIC notes, labs and imaging, and met at bedside with patient and wife  to discuss diagnosis, prognosis, GOC, disposition, and options.   I introduced Palliative Medicine as specialized medical care for people living with serious illness. It focuses on providing relief from the symptoms and stress of a serious illness.   We discussed a brief life review of the patient. He is originally from  [place]. He is retired from the [employer]. He and [name] have 3 children (2 daughters and 1 son), who all live locally to [place]. Their son recently moved in with them to help with his dad's care.   As far as functional and nutritional status, [name] reports some progressive decline. At home, he spends most of the time in bed. His son is able to help lift him as needed. He can ambulate very short distances with a walker. [name] reports she has been trying to obtain home health services, but so far without success.   We discussed his current illness and what it means in the larger context of his ongoing co-morbidities.  Natural disease trajectory of chronic illness was discussed. I attempted to elicit values and goals of care important to the patient. He is clear that he wants to continue treatment for lymphoma. He would also like to stay at home as long as possible. Patient and wife have questions regarding prognosis and when he will be able to resume chemotherapy. Discussed that he had good response after the first 3 cycles. They express concern that his treatment will be further delayed due to this hospitalization. Ultimately, patient and wife are hopeful for remission of the lymphoma.  Hospice and Palliative Care services outpatient were explained and offered.  I provided education that if at some point patient does not wish to continue chemotherapy, he would be eligible for hospice care at home. In the meantime, he could receive outpatient palliative follow-up for continued goals of care discussions.   Questions and concerns were addressed.  [name] was encouraged to call with questions or concerns.   Primary decision maker: Patient can make his own decisions, with support from wife [name].     SUMMARY OF RECOMMENDATIONS   - DNR/DNI (already in place) - patient and wife are hopeful for remission of the lymphoma - PMT will reach out to Dr. Alen Blew regarding prognosis and treatment plan -  Goal of care per patient and and wife is to continue chemotherapy as soon as possible - at minimum, patient will benefit from outpatient palliative follow-up on discharge  Code Status/Advance Care Planning:  DNR  Symptom Management:   Per primary team  Palliative Prophylaxis:   Frequent Pain Assessment and Turn Reposition  Psycho-social/Spiritual:   Desire for further Chaplaincy support: not asked  Additional Recommendations: Caregiving  Support/Resources  Prognosis:   Unable to determine  Discharge Planning: To Be Determined      Primary Diagnoses: Present on Admission: . Hospital-acquired pneumonia . AKI (acute kidney injury) ([place]) . Lactic acidosis . Acute on chronic respiratory failure with hypoxia ([place]) . Immunocompromised patient ([place]) . DVT (deep venous thrombosis) ([place]) . Hyperlipidemia with target LDL less than 70 . Persistent atrial fibrillation ([place]) . Protein-calorie malnutrition, severe . Diarrhea   I have reviewed the medical record, interviewed the patient and family, and examined the patient. The following aspects are pertinent.  Past Medical History:  Diagnosis Date  . 3vessel CAD- S/P PCI 03/27/2019   Remote pRCA PCI-stenting 2006. Acute MI 03/27/2019 treated with urgent m-d RCA PCI and stent (x 2) followed by staged PCI DES to CFX (x2 stents) and LAD (x1) on 03/29/2019  . Acute ST elevation myocardial infarction (STEMI) of inferior wall ([place]) 03/27/2019   Pt presented 03/27/2019 with an acute inferior MI- Cath 03/27/19 showed thrombotic occlusion of mid-distal RCA with a patent previously placed pRCA stent -2 overlapping Synergy DES from PDA back into proximal RCA stented segment and PTCA of RPAV (jailed)-PL 3 He also had concomitant high garde CFX and LAD disease (staged PCI) with an EF of 30-35%  . Chronic combined systolic and diastolic CHF, NYHA class 2 and ACC/AHA stage C ([place]) 03/27/2019   EF 30 to 40% with diffuse inferior hypokinesis/akinesis;  severe ischemic MR  . 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 Myocardial infarction [facility]) 2006   PCI of the RCA  . PAF (persistent-paroxysmal atrial fibrillation) ([place]) 04/16/2019   Brief- post MI  . Seizure, temporal lobe ([place]) 2017   most recent 07/23/15-on Keppra and Lamictal  . Severe mitral regurgitation by prior echocardiogram-likely ischemic with tethered posterior leaflet 03/31/2019   Repeat echo 4-6 weeks post MI   Social History   Socioeconomic History  . Marital status: Married    Spouse name: [name]  . Number of children: 3  . Years of education: 57, [employer]  . 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 Smoking status: Former Smoker    Types: Cigarettes    Quit date: 01/22/2018    Years since quitting: 1.6  . Smokeless tobacco: Never Used  . Tobacco comment: 20  Vaping Use  . Vaping Use:  Never used  Substance and Sexual Activity  . Alcohol use: Not Currently    Alcohol/week: 0.0 standard drinks    Comment: 2-5 beers daily  . Drug use: No  . Sexual activity: Yes  Other Topics Concern  . Not on file  Social History Narrative   Former [place] [employer]   Lives with wife   Right-handed   Caffeine: 3-4 cups per day    Scheduled Meds: . amiodarone  200 mg Oral Daily  . apixaban  5 mg Oral BID  . budesonide  0.5 mg Nebulization BID  . hydrocortisone sod succinate (SOLU-CORTEF) inj  50 mg Intravenous Q6H  . lamoTRIgine  100 mg Oral BID  . levETIRAcetam  750 mg Oral BID  . midodrine  10 mg Oral TID WC   Continuous Infusions: . sodium chloride    . ceFEPime (MAXIPIME) IV Stopped (09/17/19 1410)  . lactated ringers    . metronidazole Stopped (09/17/19 3013)  . norepinephrine (LEVOPHED) Adult infusion    . vancomycin Stopped (09/17/19 1032)  . vasopressin 0.03 Units/min (09/17/19 0617)   PRN Meds:.Place/Maintain arterial line **AND** sodium chloride, docusate  sodium, ipratropium-albuterol, oxyCODONE, polyethylene glycol  No Known Allergies Review of Systems  Gastrointestinal: Positive for diarrhea.  Neurological: Positive for weakness.    Physical Exam Vitals reviewed.  Constitutional:      General: He is not in acute distress.    Appearance: He is cachectic.  HENT:     Head: Normocephalic and atraumatic.  Cardiovascular:     Comments: A-flutter on monitor Levophed gtt at 30 mcg/hr Pulmonary:     Effort: Pulmonary effort is normal.  Neurological:     Mental Status: He is alert.     Vital Signs: BP (!) 79/55   Pulse 69   Temp 98.4 F (36.9 C) (Oral)   Resp (!) 29   Ht 6' (1.829 m)   Wt 81.5 kg   SpO2 100%   BMI 24.37 kg/m  Pain Scale: 0-10    SpO2: SpO2: 100 % O2 Device:SpO2: 100 % O2 Flow Rate: .O2 Flow Rate (L/min): 6 L/min  IO: Intake/output summary:   Intake/Output Summary (Last 24 hours) at 09/17/2019 1219 Last data filed at 09/17/2019 0930 Gross per 24 hour  Intake 1933.52 ml  Output --  Net 1933.52 ml       Palliative Assessment/Data: 30%    Time In: 14:00 Time Out: 14:50 Time Total: 50 minutes Greater than 50%  of this time was spent counseling and coordinating care related to the above assessment and plan.  Signed by: Lavena Bullion, NP   Please contact Palliative Medicine Team phone at [phone] for questions and concerns.  For individual provider: See Shea Evans

## 2019-09-17 NOTE — Procedures (Signed)
Arterial Catheter Insertion Procedure Note  Antonie Borjon  [id number]  [birth year]  Date:09/17/19  Time:5:48 AM    Provider Performing: Otilio Carpen Jojuan Champney    Procedure: Insertion of Arterial Line [phone]) with US guidance ([id number])   Indication(s) Blood pressure monitoring and/or need for frequent ABGs  Consent Risks of the procedure as well as the alternatives and risks of each were explained to the patient and/or caregiver.  Consent for the procedure was obtained and is signed in the bedside chart  Anesthesia None   Time Out Verified patient identification, verified procedure, site/side was marked, verified correct patient position, special equipment/implants available, medications/allergies/relevant history reviewed, required imaging and test results available.   Sterile Technique Maximal sterile technique including full sterile barrier drape, hand hygiene, sterile gown, sterile gloves, mask, hair covering, sterile ultrasound probe cover (if used).   Procedure Description Area of catheter insertion was cleaned with chlorhexidine and draped in sterile fashion. With real-time ultrasound guidance an arterial catheter was placed into the right radial artery.  Appropriate arterial tracings confirmed on monitor.     Complications/Tolerance None; patient tolerated the procedure well.   EBL Minimal   Specimen(s) None   Otilio Carpen Retha Bither, PA-C
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
## 2019-09-17 NOTE — Procedures (Signed)
Central Venous Catheter Insertion Procedure Note Tony Saunders [id number] [birth year]  Procedure: Insertion of Central Venous Catheter Indications: Assessment of intravascular volume  Procedure Details Consent: Risks of procedure as well as the alternatives and risks of each were explained to the (patient/caregiver).  Consent for procedure obtained. Time Out: Verified patient identification, verified procedure, site/side was marked, verified correct patient position, special equipment/implants available, medications/allergies/relevent history reviewed, required imaging and test results available.  Performed  Maximum sterile technique was used including antiseptics, cap, gloves, gown, hand hygiene, mask and sheet. Skin prep: Chlorhexidine; local anesthetic administered A antimicrobial bonded/coated triple lumen catheter was placed in the left internal jugular vein using the Seldinger technique.  Ultrasound was used to verify the patency of the vein and for real time needle guidance.  Evaluation Blood flow good Complications: No apparent complications Patient did tolerate procedure well. Chest X-ray ordered to verify placement.  CXR: pending.  Roselie Awkward 09/17/2019, 9:36 AM

## 2019-09-17 NOTE — Progress Notes (Signed)
Noticed aline dressing saturated in blood that had leaked on patients gown and sheets. Bleeding controled and dressing changed. [place] notified of blood loss.

## 2019-09-17 NOTE — Progress Notes (Signed)
NAME:  Tony Saunders, MRN:  [id number], DOB:  [birth year], LOS: 0 ADMISSION DATE:  08/21/2019, CONSULTATION DATE:  8/30 REFERRING MD: Quentin Cornwall, CHIEF COMPLAINT:  dyspnea  Brief History   64 y/o male with multiple medical problems was brought to the [facility] ED on 8/30 in the setting of fever, confusion, and circulatory shock presumably due to septic shock.  Past Medical History  CAD s/p STEMI/PCI 03/2019 Ischemic cardiomyopathy LVEF 35% Diffuse large B-cell lymphoma Persistent atrial fibrillation DVT RUE  Severe mitral regurgitation Tobacco abuse, quit 2019 Temporal lobe epilepsy  Significant Hospital Events   8/22 discharge CHF exacerbation, R pleural effusion  8/30 admission  Consults:    Procedures:  8/30 L IJ CVL >   Significant Diagnostic Tests:    Micro Data:  8/29 blood >  8/29 SARS COV 2 > negative 8/30 urine >  8/30 c diff >   Antimicrobials:  8/29 cefepime >  8/29 flagyl >  8/29 IV vanc >   Interim history/subjective:  Coughing Weak Feels some dyspnea Notes left foot pain Denies abdominal pain  His wife says that he was coughing more at home, had a fever yesterday; she says he never complained of abodminal pain but did have persistent diarrhea this week  Objective   Blood pressure 105/70, pulse (!) 38, temperature 98.4 F (36.9 C), temperature source Oral, resp. rate (!) 38, height 6' (1.829 m), weight 81.5 kg, SpO2 97 %.        Intake/Output Summary (Last 24 hours) at 09/17/2019 1024 Last data filed at 09/17/2019 0930 Gross per 24 hour  Intake 1933.52 ml  Output --  Net 1933.52 ml   Filed Weights   09/09/2019 2000  Weight: 81.5 kg    Examination: General: Very weak, cachectic increased work of breathing, but speaking in full sentences, no accessory muscle use HENT: NCAT OP clear temporal wasting PULM: Rhonchi bilaterally CV: RRR, no mgr GI: BS+, soft, nontender MSK: normal bulk and tone Derm: bilateral leg edema, bruising left foot  greater than right Neuro: awake, alert, no distress, MAEW   Resolved Hospital Problem list    Assessment & Plan:  Acute respiratory failure with hypoxemia: Presumably due to healthcare associated pneumonia, complicated by right pleural effusion, recurrent Administer O2 to maintain O2 saturation greater than 90% Perform right-sided thoracentesis after holding eliquis> will need to get consent from patient and wife today; hold eliquis if they give consent Continue IV vancomycin and cefepime  Septic shock: Presumably due to healthcare associated pneumonia Placed central line Check CVP Continue vasopressin at current dosing Wean off Levophed for mean arterial pressure greater than 65 Continue midodrine 10 mg 3 times daily Continue vancomycin Continue cefepime Continue Flagyl for now, follow-up C. difficile though clinical history supports healthcare associated pneumonia rather than C. difficile  Acute kidney injury in setting of septic shock:  Monitor BMET and UOP Replace electrolytes as needed  Acute on chronic systolic heart failure, question component of circulatory/cardiogenic shock: Place central line Check cooximetry Monitor lactic acid and urine output  Atrial fibrillation Amiodarone Tele eliquis  History of temporal lobe epilepsy Continue Keppra Continue Lamictal  Anemia of chronic disease: Monitor for bleeding Transfuse PRBC for Hgb < 7 gm/dL  Generalized weakness PT consult if stabilizes  Goals of care: He is emaciated, very weak, has been unable to receive treatment for his cancer which apparently has a favorable prognosis however his multiple medical problems including advanced heart failure, severe mitral regurgitation and recurrent infections have prevented  him from receiving treatment recently.  His performance status is certainly in no shape at this point where he could receive ongoing treatment until he has significant improvement.  I talked to both he and  his wife today by phone and advised that they consider a DNR status as mechanical ventilation and chest compressions would provide no medical benefit and would cause more harm than good.  If intubated and requiring mechanical ventilation he would not come off of the ventilator. Palliative consulted.  Full code for now, family coming in later today  Best practice:  Diet: regular Pain/Anxiety/Delirium protocol (if indicated): n/a VAP protocol (if indicated): n/a DVT prophylaxis: eliquis GI prophylaxis: pantoprazole Glucose control: SSI Mobility: bed rest Code Status: full, see discussion above Family Communication: see discussion above Disposition:   Labs   CBC: Recent Labs  Lab 09/13/2019 2006 09/17/19 0142 09/17/19 0148 09/17/19 0529  WBC 15.3*  --   --   --   NEUTROABS 14.4*  --   --   --   HGB 10.1* 8.2* 8.5* 8.8*  HCT 33.7* 24.0* 25.0* 26.0*  MCV 94.1  --   --   --   PLT 122*  --   --   --     Basic Metabolic Panel: Recent Labs  Lab 09/15/2019 2006 09/17/19 0142 09/17/19 0148 09/17/19 0529  NA 137 136 136 133*  K 4.7 4.3 4.3 3.8  CL 92*  --   --   --   CO2 31  --   --   --   GLUCOSE 70  --   --   --   BUN 19  --   --   --   CREATININE 1.42*  --   --   --   CALCIUM 8.5*  --   --   --    GFR: Estimated Creatinine Clearance: 59.2 mL/min (A) (by C-G formula based on SCr of 1.42 mg/dL (H)). Recent Labs  Lab 09/17/2019 2006 09/07/2019 2021 08/27/2019 2157  WBC 15.3*  --   --   LATICACIDVEN  --  3.0* 1.5    Liver Function Tests: Recent Labs  Lab 09/13/2019 2006  AST 26  ALT 19  ALKPHOS 86  BILITOT 0.6  PROT 4.7*  ALBUMIN 2.5*   No results for input(s): LIPASE, AMYLASE in the last 168 hours. No results for input(s): AMMONIA in the last 168 hours.  ABG    Component Value Date/Time   PHART 7.493 (H) 09/17/2019 0529   PCO2ART 39.1 09/17/2019 0529   PO2ART 98 09/17/2019 0529   HCO3 30.0 (H) 09/17/2019 0529   TCO2 31 09/17/2019 0529   ACIDBASEDEF 2.0  03/29/2019 1012   O2SAT 98.0 09/17/2019 0529     Coagulation Profile: Recent Labs  Lab 09/09/2019 2006  INR 1.7*    Cardiac Enzymes: No results for input(s): CKTOTAL, CKMB, CKMBINDEX, TROPONINI in the last 168 hours.  HbA1C: Hgb A1c MFr Bld  Date/Time Value Ref Range Status  03/27/2019 07:06 AM 5.3 4.8 - 5.6 % Final    Comment:    (NOTE) Pre diabetes:          5.7%-6.4% Diabetes:              >6.4% Glycemic control for   <7.0% adults with diabetes     CBG: No results for input(s): GLUCAP in the last 168 hours.   Critical care time: 35 minutes     Roselie Awkward, MD [facility] Pager: [phone] Cell: [phone] If  no response, call [phone]

## 2019-09-18 DIAGNOSIS — Y95 Nosocomial condition: Secondary | ICD-10-CM | POA: Diagnosis not present

## 2019-09-18 LAB — COMPREHENSIVE METABOLIC PANEL
ALT: 41 U/L (ref 0–44)
AST: 61 U/L — ABNORMAL HIGH (ref 15–41)
Albumin: 2.5 g/dL — ABNORMAL LOW (ref 3.5–5.0)
Alkaline Phosphatase: 75 U/L (ref 38–126)
Anion gap: 12 (ref 5–15)
BUN: 24 mg/dL — ABNORMAL HIGH (ref 8–23)
CO2: 26 mmol/L (ref 22–32)
Calcium: 7.5 mg/dL — ABNORMAL LOW (ref 8.9–10.3)
Chloride: 95 mmol/L — ABNORMAL LOW (ref 98–111)
Creatinine, Ser: 1.35 mg/dL — ABNORMAL HIGH (ref 0.61–1.24)
GFR calc Af Amer: >60 mL/min (ref >60)
GFR calc non Af Amer: 56 mL/min — ABNORMAL LOW (ref >60)
Glucose, Bld: 103 mg/dL — ABNORMAL HIGH (ref 70–99)
Potassium: 3.8 mmol/L (ref 3.5–5.1)
Sodium: 133 mmol/L — ABNORMAL LOW (ref 135–145)
Total Bilirubin: 1.2 mg/dL (ref 0.3–1.2)
Total Protein: 4.3 g/dL — ABNORMAL LOW (ref 6.5–8.1)

## 2019-09-18 LAB — POCT I-STAT EG7
Acid-Base Excess: 6 mmol/L — ABNORMAL HIGH (ref 0.0–2.0)
Bicarbonate: 32.2 mmol/L — ABNORMAL HIGH (ref 20.0–28.0)
Calcium, Ion: 1.11 mmol/L — ABNORMAL LOW (ref 1.15–1.40)
HCT: 30 % — ABNORMAL LOW (ref 39.0–52.0)
Hemoglobin: 10.2 g/dL — ABNORMAL LOW (ref 13.0–17.0)
O2 Saturation: 64 %
Patient temperature: 97.6
Potassium: 3.8 mmol/L (ref 3.5–5.1)
Sodium: 132 mmol/L — ABNORMAL LOW (ref 135–145)
TCO2: 34 mmol/L — ABNORMAL HIGH (ref 22–32)
pCO2, Ven: 51 mmHg (ref 44.0–60.0)
pH, Ven: 7.405 (ref 7.250–7.430)
pO2, Ven: 33 mmHg (ref 32.0–45.0)

## 2019-09-18 LAB — CBC WITH DIFFERENTIAL/PLATELET
Abs Immature Granulocytes: 0 10*3/uL (ref 0.00–0.07)
Band Neutrophils: 1 %
Basophils Absolute: 0 10*3/uL (ref 0.0–0.1)
Basophils Relative: 0 %
Eosinophils Absolute: 0 10*3/uL (ref 0.0–0.5)
Eosinophils Relative: 0 %
HCT: 27 % — ABNORMAL LOW (ref 39.0–52.0)
Hemoglobin: 8.2 g/dL — ABNORMAL LOW (ref 13.0–17.0)
Lymphocytes Relative: 0 %
Lymphs Abs: 0 10*3/uL — ABNORMAL LOW (ref 0.7–4.0)
MCH: 28.4 pg (ref 26.0–34.0)
MCHC: 30.4 g/dL (ref 30.0–36.0)
MCV: 93.4 fL (ref 80.0–100.0)
Monocytes Absolute: 0 10*3/uL — ABNORMAL LOW (ref 0.1–1.0)
Monocytes Relative: 0 %
Neutro Abs: 33.7 10*3/uL — ABNORMAL HIGH (ref 1.7–7.7)
Neutrophils Relative %: 99 %
Platelets: 112 10*3/uL — ABNORMAL LOW (ref 150–400)
RBC: 2.89 MIL/uL — ABNORMAL LOW (ref 4.22–5.81)
RDW: 17.9 % — ABNORMAL HIGH (ref 11.5–15.5)
WBC: 33.7 10*3/uL — ABNORMAL HIGH (ref 4.0–10.5)
nRBC: 0 % (ref 0.0–0.2)

## 2019-09-18 LAB — GLUCOSE, CAPILLARY
Glucose-Capillary: 111 mg/dL — ABNORMAL HIGH (ref 70–99)
Glucose-Capillary: 111 mg/dL — ABNORMAL HIGH (ref 70–99)
Glucose-Capillary: 117 mg/dL — ABNORMAL HIGH (ref 70–99)
Glucose-Capillary: 140 mg/dL — ABNORMAL HIGH (ref 70–99)
Glucose-Capillary: 86 mg/dL (ref 70–99)
Glucose-Capillary: 87 mg/dL (ref 70–99)

## 2019-09-18 LAB — LAMOTRIGINE LEVEL: Lamotrigine Lvl: 9.8 ug/mL (ref 2.0–20.0)

## 2019-09-18 MED ORDER — ALBUMIN HUMAN 25 % IV SOLN
50.0000 g | Freq: Once | INTRAVENOUS | Status: AC
  Administered 2019-09-18: 50 g via INTRAVENOUS
  Filled 2019-09-18: qty 200

## 2019-09-18 MED ORDER — POTASSIUM CHLORIDE CRYS ER 20 MEQ PO TBCR
20.0000 meq | EXTENDED_RELEASE_TABLET | Freq: Once | ORAL | Status: AC
  Administered 2019-09-18: 20 meq via ORAL
  Filled 2019-09-18: qty 1

## 2019-09-18 MED ORDER — SODIUM CHLORIDE 0.9% FLUSH
10.0000 mL | Freq: Two times a day (BID) | INTRAVENOUS | Status: DC
  Administered 2019-09-18 – 2019-09-20 (×5): 10 mL

## 2019-09-18 MED ORDER — SODIUM CHLORIDE 0.9% FLUSH: 10.0000 mL | INTRAVENOUS | Status: DC | PRN

## 2019-09-18 NOTE — Telephone Encounter (Signed)
Patient currently admitted in the hospital.  

## 2019-09-18 NOTE — Progress Notes (Signed)
Pharmacy Electrolyte Replacement  Recent Labs: K 3.8  Recent Labs    09/18/19 0536  K 3.8  CREATININE 1.35*    Plan: KCl 20 mEq by mouth x1   Wilson Singer, PharmD PGY1 Pharmacy Resident 09/18/2019 8:04 AM

## 2019-09-18 NOTE — Progress Notes (Addendum)
NAME:  Tony Saunders, MRN:  [id number], DOB:  Aug 01, 1955, LOS: 1 ADMISSION DATE:  08/28/2019, CONSULTATION DATE:  8/30 REFERRING MD: Quentin Cornwall, CHIEF COMPLAINT:  dyspnea  Brief History   64 y/o male with multiple medical problems was brought to the [facility] ED on 8/30 in the setting of fever, confusion, and circulatory shock presumably due to septic shock.  Past Medical History  CAD s/p STEMI/PCI 03/2019 Ischemic cardiomyopathy LVEF 35% Diffuse large B-cell lymphoma Persistent atrial fibrillation DVT RUE  Severe mitral regurgitation Tobacco abuse, quit 2019 Temporal lobe epilepsy  Significant Hospital Events   8/22 discharge CHF exacerbation, R pleural effusion  8/30 admission  Consults:    Procedures:  8/30 L IJ CVL >   Significant Diagnostic Tests:    Micro Data:  8/29 blood >  8/29 SARS COV 2 > negative 8/30 urine >  8/30 c diff >   Antimicrobials:  8/29 cefepime >  8/29 flagyl >  8/29 IV vanc > 8/31   Interim history/subjective:  Alert, confused. Pressors weaning.   Objective   Blood pressure (!) 90/55, pulse 67, temperature 97.7 F (36.5 C), temperature source Oral, resp. rate (!) 22, height 6' (1.829 m), weight 78.7 kg, SpO2 100 %. CVP:  [8 mmHg-63 mmHg] 14 mmHg      Intake/Output Summary (Last 24 hours) at 09/18/2019 1035 Last data filed at 09/18/2019 1000 Gross per 24 hour  Intake 2817.81 ml  Output 590 ml  Net 2227.81 ml   Filed Weights   08/29/2019 2000 09/18/19 0500  Weight: 81.5 kg 78.7 kg    Examination: General: Very weak, cachectic increased work of breathing, but speaking in full sentences, no accessory muscle use HENT: NCAT OP clear temporal wasting PULM: Rhonchi bilaterally CV: RRR, no mgr GI: BS+, soft, nontender MSK: normal bulk and tone Derm: bilateral leg edema, bruising left foot greater than right Neuro: awake, alert, no distress, [place] Hospital Problem list    Assessment & Plan:  Acute respiratory failure with  hypoxemia: Presumably due to healthcare associated pneumonia, complicated by right pleural effusion, recurrent Administer O2 to maintain O2 saturation greater than 90% R sided effusion chronic, tapped 09/05/19 and transudate, likely related to chronic volume overload/hypoalbuminemia, hemodynamics improving, no role for thora/pigtail catheter now, re-evaluate in future as needed Continue IV cefepime, vanc d/c'd 8/31 (MRSA screen negative)  Septic shock: Presumably due to healthcare associated pneumonia Vasopressin off Wean off Levophed for mean arterial pressure greater than 65 Continue midodrine 10 mg 3 times daily Continue cefepime and Flagyl for now, no diarrhea while admitted, plan to d-escalate at 48 hrs if cultures negative  Acute kidney injury in setting of septic shock: Cr trend down. Monitor BMET and UOP Replace electrolytes as needed  Acute on chronic systolic heart failure, question component of circulatory/cardiogenic shock: Chronic volume overload on CXR Check flat CVP, Check SvO2 as surrogate for CO  Atrial fibrillation Amiodarone Tele Holding eliquis  History of temporal lobe epilepsy Continue Keppra Continue Lamictal  Anemia of chronic disease: Monitor for bleeding Transfuse PRBC for Hgb < 7 gm/dL  Generalized weakness PT consult if stabilizes  Best practice:  Diet: regular Pain/Anxiety/Delirium protocol (if indicated): n/a VAP protocol (if indicated): n/a DVT prophylaxis: eliquis GI prophylaxis: pantoprazole Glucose control: SSI Mobility: bed rest Code Status: DNR Family Communication: daily at bedside Disposition: ICU  Labs   CBC: Recent Labs  Lab 08/31/2019 2006 09/09/2019 2006 09/17/19 0142 09/17/19 0148 09/17/19 0529 09/17/19 2154 09/18/19 0536  WBC  15.3*  --   --   --   --  45.2* 33.7*  NEUTROABS 14.4*  --   --   --   --   --  33.7*  HGB 10.1*   < > 8.2* 8.5* 8.8* 8.7* 8.2*  HCT 33.7*   < > 24.0* 25.0* 26.0* 28.9* 27.0*  MCV 94.1  --   --    --   --  94.8 93.4  PLT 122*  --   --   --   --  142* 112*   < > = values in this interval not displayed.    Basic Metabolic Panel: Recent Labs  Lab 09/05/2019 2006 09/17/19 0142 09/17/19 0148 09/17/19 0529 09/18/19 0536  NA 137 136 136 133* 133*  K 4.7 4.3 4.3 3.8 3.8  CL 92*  --   --   --  95*  CO2 31  --   --   --  26  GLUCOSE 70  --   --   --  103*  BUN 19  --   --   --  24*  CREATININE 1.42*  --   --   --  1.35*  CALCIUM 8.5*  --   --   --  7.5*   GFR: Estimated Creatinine Clearance: 62.3 mL/min (A) (by C-G formula based on SCr of 1.35 mg/dL (H)). Recent Labs  Lab 08/20/2019 2006 09/11/2019 2021 08/27/2019 2157 09/17/19 2154 09/18/19 0536  WBC 15.3*  --   --  45.2* 33.7*  LATICACIDVEN  --  3.0* 1.5  --   --     Liver Function Tests: Recent Labs  Lab 08/22/2019 2006 09/18/19 0536  AST 26 61*  ALT 19 41  ALKPHOS 86 75  BILITOT 0.6 1.2  PROT 4.7* 4.3*  ALBUMIN 2.5* 2.5*   No results for input(s): LIPASE, AMYLASE in the last 168 hours. No results for input(s): AMMONIA in the last 168 hours.  ABG    Component Value Date/Time   PHART 7.493 (H) 09/17/2019 0529   PCO2ART 39.1 09/17/2019 0529   PO2ART 98 09/17/2019 0529   HCO3 30.0 (H) 09/17/2019 0529   TCO2 31 09/17/2019 0529   ACIDBASEDEF 2.0 03/29/2019 1012   O2SAT 75.7 09/17/2019 1640     Coagulation Profile: Recent Labs  Lab 09/08/2019 2006  INR 1.7*    Cardiac Enzymes: No results for input(s): CKTOTAL, CKMB, CKMBINDEX, TROPONINI in the last 168 hours.  HbA1C: Hgb A1c MFr Bld  Date/Time Value Ref Range Status  03/27/2019 07:06 AM 5.3 4.8 - 5.6 % Final    Comment:    (NOTE) Pre diabetes:          5.7%-6.4% Diabetes:              >6.4% Glycemic control for   <7.0% adults with diabetes     CBG: Recent Labs  Lab 09/17/19 1527 09/17/19 2008 09/17/19 2349 09/18/19 0339 09/18/19 0751  GLUCAP 116* 125* 95 86 87     CRITICAL CARE Performed by: Bonna Gains Dejanique Ruehl   Total critical care  time: 32 minutes  Critical care time was exclusive of separately billable procedures and treating other patients.  Critical care was necessary to treat or prevent imminent or life-threatening deterioration.  Critical care was time spent personally by me on the following activities: development of treatment plan with patient and/or surrogate as well as nursing, discussions with consultants, evaluation of patient's response to treatment, examination of patient, obtaining history from patient or  surrogate, ordering and performing treatments and interventions, ordering and review of laboratory studies, ordering and review of radiographic studies, pulse oximetry and re-evaluation of patient's condition.

## 2019-09-18 NOTE — Progress Notes (Signed)
[name] to me with diffuse large cell lymphoma and bulky adenopathy above and below the diaphragm.  He has received 4 cycles of chemotherapy and had experiencing excellent response after cycle 3.  He has had multiple comorbid conditions with a lot of cardiac comorbidities and has required multiple hospitalizations throughout his chemotherapy course.  He is currently hospitalized with respiratory failure possible sepsis related to pneumonia and right pleural effusion.  He is requiring vasopressors and currently on broad-spectrum antibiotics.  Continues to be DNR which is consistent with his wishes.  His prognosis from lymphoma standpoint remains complicated because of his other comorbidities.  His lymphoma is potentially curable and had a good response after 4 cycles but he continues to be rather frail with multiple complications after each treatment.  It is theoretically possible that he could achieve complete response to chemotherapy for his lymphoma.  This is considered less likely given his overall frail status, cardiac comorbidities and overall debilitation.   I agree with the current management approach with treating reversible conditions.  I agree with DNR status.  If his condition improves revisiting chemotherapy readministration is possible.  If he continues to decline, comfort care would be reasonable.

## 2019-09-18 NOTE — Progress Notes (Addendum)
Daily Progress Note   Patient Name: Tony Saunders       Date: 09/18/2019 DOB: 01/18/1956  Age: 64 y.o. MRN#: [id number] Attending Physician: Lanier Clam, MD Primary Care Physician: [name], [name], MD Admit Date: 08/23/2019  Reason for Consultation/Follow-up: continued GOC discussion in the setting of complex medical decision making.  Next of kin: Tony Saunders (spouse) [phone]  Subjective: Patient is alert, lying in bed, does report foot pain. Wife/[name] reports speaking with Dr. Alen Blew earlier today. They verbalize understanding that if his condition improves, it would be possible to resume treatment with chemotherapy. We discuss he has encountered multiple complications after each treatment, and overall is very frail. We discuss that (per Dr. Alen Blew), prognosis from a lymphoma standpoint is complicated due to his other co-morbidities.   Patient states he doesn't think his condition will improve in the setting of cardiac co-morbidities. He reports the original plan was to receive treatment for the lymphoma, then undergo valve repair. We discuss that he has unfortunately encountered a lot of complications that were not originally anticipated. We discuss that his body is not in the condition to tolerate an invasive procedure. Patient states, "but what do I have to lose" and "it's my only chance". He requests to hear from Dr. Haroldine Laws regarding options for treatment from a cardiac standpoint.   We discuss that if his condition declines and resuming chemotherapy is no longer an option, then he would be eligible for hospice care. I provided education about benefits of hospice care at home including spending time with family, medications for comfort, and support with  daily care.    Length of Stay: 1   Physical Exam Vitals reviewed.  Constitutional:      General: He is not in acute distress.    Appearance: He is cachectic.  Cardiovascular:     Rate and Rhythm: Normal rate.     Comments: Levophed at 6 mcg/hr Pulmonary:     Effort: Pulmonary effort is normal.  Neurological:     Mental Status: He is alert and oriented to person, place, and time.             Vital Signs: BP (!) 83/59   Pulse 77   Temp (!) 97.3 F (36.3 C) (Oral)   Resp (!) 27   Ht 6' (  1.829 m)   Wt 78.7 kg   SpO2 100%   BMI 23.53 kg/m  SpO2: SpO2: 100 % O2 Device: O2 Device: Nasal Cannula O2 Flow Rate: O2 Flow Rate (L/min): 5 L/min   Palliative Care Assessment & Plan   HPI/Patient Profile: 64 y.o. male  with B-cell lymphoma (diagnosed May 2021) receiving active chemotherapy 4/6 cycles presented to the [facility] emergency department on 09/04/2019 with fever, AMS, and diarrhea. He was recently admitted for acute CHF and right pleural effusion, discharged 8/22. Patient was hypotensive in the ED, requiring vasopressors, admitted to [facility] for management of septic shock secondary to possible pneumonia +/- C-diff.   Pertinent labs in the ED include WBC-15.3, BNP-753, lactic acid-3.0, creatinine 1.42, albumin-2.5. Chest x-ray shows increased airspace opacities throughout the right lung suspicious for infection or possible asymmetric edema and small right pleural effusion.   Assessment: - septic shock presumably due to HCAP - acute respiratory failure presumably due to HCAP - Large B-cell lymphoma: managed by Dr. Alen Blew, status post radiation, on chemotherapy 4/6 cycles - acute on chronic CHF with volume overload - CAD, recent MI (March 2021), s/p stent placement - persistent A-flutter - severe mitral regurgitation - protein-calorie malnutrition, severe - history of right upper extremity DVT - history of seizure disorder   Recommendations/Plan: - DNR/DNI (already in place) - I  have reached out to Dr. Haroldine Laws, he will see patient tomorrow - PMT will continue to follow - Patient is eligible for hospice care if resuming chemotherapy is not an option  Code Status: DNR/DNI  Prognosis:   Likely months if he continues to decline  Discharge Planning:  To Be Determined  Care plan was discussed with bedside RN  Thank you for allowing the Palliative Medicine Team to assist in the care of this patient.   Total Time 35 minutes Prolonged Time Billed  no       Greater than 50%  of this time was spent counseling and coordinating care related to the above assessment and plan.  Lavena Bullion, NP  Please contact Palliative Medicine Team phone at [phone] for questions and concerns.

## 2019-09-19 ENCOUNTER — Inpatient Hospital Stay: Payer: No Typology Code available for payment source

## 2019-09-19 DIAGNOSIS — Z7189 Other specified counseling: Secondary | ICD-10-CM

## 2019-09-19 DIAGNOSIS — Z515 Encounter for palliative care: Secondary | ICD-10-CM

## 2019-09-19 DIAGNOSIS — R579 Shock, unspecified: Secondary | ICD-10-CM

## 2019-09-19 DIAGNOSIS — Z711 Person with feared health complaint in whom no diagnosis is made: Secondary | ICD-10-CM

## 2019-09-19 LAB — COOXEMETRY PANEL
Carboxyhemoglobin: 1.3 % (ref 0.5–1.5)
Methemoglobin: 0.7 % (ref 0.0–1.5)
O2 Saturation: 57.2 %
Total hemoglobin: 7.9 g/dL — ABNORMAL LOW (ref 12.0–16.0)

## 2019-09-19 LAB — CBC
HCT: 25.9 % — ABNORMAL LOW (ref 39.0–52.0)
Hemoglobin: 7.8 g/dL — ABNORMAL LOW (ref 13.0–17.0)
MCH: 28.4 pg (ref 26.0–34.0)
MCHC: 30.1 g/dL (ref 30.0–36.0)
MCV: 94.2 fL (ref 80.0–100.0)
Platelets: 89 10*3/uL — ABNORMAL LOW (ref 150–400)
RBC: 2.75 MIL/uL — ABNORMAL LOW (ref 4.22–5.81)
RDW: 17.6 % — ABNORMAL HIGH (ref 11.5–15.5)
WBC: 29.9 10*3/uL — ABNORMAL HIGH (ref 4.0–10.5)
nRBC: 0 % (ref 0.0–0.2)

## 2019-09-19 LAB — COMPREHENSIVE METABOLIC PANEL
ALT: 43 U/L (ref 0–44)
AST: 52 U/L — ABNORMAL HIGH (ref 15–41)
Albumin: 2.8 g/dL — ABNORMAL LOW (ref 3.5–5.0)
Alkaline Phosphatase: 90 U/L (ref 38–126)
Anion gap: 11 (ref 5–15)
BUN: 24 mg/dL — ABNORMAL HIGH (ref 8–23)
CO2: 26 mmol/L (ref 22–32)
Calcium: 7.7 mg/dL — ABNORMAL LOW (ref 8.9–10.3)
Chloride: 95 mmol/L — ABNORMAL LOW (ref 98–111)
Creatinine, Ser: 1.29 mg/dL — ABNORMAL HIGH (ref 0.61–1.24)
GFR calc Af Amer: >60 mL/min (ref >60)
GFR calc non Af Amer: 59 mL/min — ABNORMAL LOW (ref >60)
Glucose, Bld: 124 mg/dL — ABNORMAL HIGH (ref 70–99)
Potassium: 3.8 mmol/L (ref 3.5–5.1)
Sodium: 132 mmol/L — ABNORMAL LOW (ref 135–145)
Total Bilirubin: 0.9 mg/dL (ref 0.3–1.2)
Total Protein: 4.8 g/dL — ABNORMAL LOW (ref 6.5–8.1)

## 2019-09-19 LAB — GLUCOSE, CAPILLARY
Glucose-Capillary: 130 mg/dL — ABNORMAL HIGH (ref 70–99)
Glucose-Capillary: 138 mg/dL — ABNORMAL HIGH (ref 70–99)

## 2019-09-19 MED ORDER — VASOPRESSIN 20 UNITS/100 ML INFUSION FOR SHOCK
0.0000 [IU]/min | INTRAVENOUS | Status: DC
  Administered 2019-09-19 – 2019-09-20 (×3): 0.03 [IU]/min via INTRAVENOUS
  Filled 2019-09-19 (×4): qty 100

## 2019-09-19 NOTE — Consult Note (Signed)
[place] Nurse Consult Note: Patient receiving care in [facility] 2M07.  Daughter in room at the time of my assessment. Reason for Consult: "pressure ulcers on his bottom, cellulitis of legs" BUE and BLE have multiple areas of broken skin integrity.  The patient has extremely fragile skin.  Many of these areas are weeping, and many are covered in highly adhesive bandages.  These are extremely likely to tear his skin when removed.  The LLL and dorsum of the left foot are grossly edematous and discolored a maroon color. There is a stage 2 PI to the lower left buttock that measures 1.3 cm x 2 cm x no depth with a thin covering of slick yellow consistent with a biofilm. For the BUE orders for twice daily cleansing with No Rinse Skin Cleanser, application of Mepitel, ABD and Kerlex.  The Mepitel can remain in place up to 5 days. For the BLE, it is preferred for staff to be able to view this critically ill patient's fragile skin each shift, rather than obscuring by a multi-day application of unna boot.  Therefore, I have ordered the same dressing system as the BUEs, and additionally spiral wrapping the kerlex and Ace bandages from just behind the toes to just below the knees. For the left buttock I ordered a small pieces of Xeroform gauze Kellie Simmering [phone]) covered by a small foam dressing; change daily. And, to eliminate pressure to the heels, Prevalon heel lift boots. Thank you for the consult.  Discussed plan of care with the patient.  [place] nurse will not follow at this time.  Please re-consult the [place] team if needed.  Val Riles, RN, MSN, CWOCN, CNS-BC, pager [phone]
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
## 2019-09-19 NOTE — Progress Notes (Addendum)
Daily Progress Note   Patient Name: Tony Saunders       Date: 09/19/2019 DOB: February 28, 1955  Age: 64 y.o. MRN#: [id number] Attending Physician: Tony Clam, MD Primary Care Physician: Tony Saunders, Tony Halsted, MD Admit Date: 09/08/2019  Reason for Consultation/Follow-up: continued GOC discussion in the setting of complex medical decision making  Next of YIA:XKPVVZ Paule (spouse) [phone]  Subjective: I spoke at length with the patient and daughter/Tony Saunders at the bedside. Also spoke with wife/Tony Saunders on the phone. Family understands that patient is not a candidate for aggressive cardiac interventions due to his progressive deterioration and overall frail condition. Tony Saunders discussed this with them at length earlier today.  They also verbalize understanding that it is very unlikely he will be able to receive additional chemotherapy at this point.  Discussed at length continuing aggressive medical care versus comfort care. Wife and daughter are leaning more toward comfort care at this point. Patient states his priorities are to be comfortable and also to spend time with his grandchildren. He laments that he wants to see his grandchildren grow up.   I discuss at length with patient and family the benefits of hospice care at home--symptom management, avoidance of hospitalization, focus on comfort, and increased quality of life. Patient is struggling with the concept of "giving up" versus acceptance of this very unfortunate situation.   Patient and family need time to process that his condition has become terminal. Decision at this point is remaining in the hospital for continued aggressive care, transition to comfort care in the hospital, or discharge home with hospice.     Length of Stay: 2  Current Medications: Scheduled Meds:  . amiodarone  200 mg Oral Daily  . apixaban  5 mg Oral BID  . budesonide  0.5 mg Nebulization BID  . Chlorhexidine Gluconate Cloth  6 each Topical Daily  . hydrocortisone sod succinate (SOLU-CORTEF) inj  50 mg Intravenous Q6H  . lamoTRIgine  100 mg Oral BID  . levETIRAcetam  750 mg Oral BID  . mouth rinse  15 mL Mouth Rinse BID  . sodium chloride flush  10-40 mL Intracatheter Q12H    Continuous Infusions: . sodium chloride    . sodium chloride    . ceFEPime (MAXIPIME) IV 200 mL/hr at 09/19/19 1500  . norepinephrine (LEVOPHED) Adult  infusion 2 mcg/min (09/19/19 1500)  . vasopressin 0.03 Units/min (09/19/19 1617)    PRN Meds: Place/Maintain arterial line **AND** sodium chloride, Place/Maintain arterial line **AND** sodium chloride, docusate sodium, HYDROmorphone (DILAUDID) injection, ipratropium-albuterol, oxyCODONE, polyethylene glycol, sodium chloride flush  Physical Exam Vitals reviewed.  Constitutional:      General: He is sleeping. He is not in acute distress.    Appearance: He is cachectic.  Cardiovascular:     Rate and Rhythm: Normal rate.     Comments: Levophed at 2 mcg/hr Pulmonary:     Effort: Pulmonary effort is normal.  Musculoskeletal:     Right lower leg: Edema present.     Left lower leg: Edema present.             Vital Signs: BP (!) 87/62   Pulse 69   Temp (!) 97.5 F (36.4 C) (Oral)   Resp (!) 22   Ht 6' (1.829 m)   Wt 83.8 kg   SpO2 95%   BMI 25.06 kg/m  SpO2: SpO2: 95 % O2 Device: O2 Device: Nasal Cannula O2 Flow Rate: O2 Flow Rate (L/min): 5 L/min    Palliative Assessment/Data: PPS 30%     Palliative Care Assessment & Plan   HPI/Patient Profile:63 y.o.malewith B-cell lymphoma (diagnosed May 2021) receiving active chemotherapy 4/6 cycles [facility] departmenton 8/29/2021with fever,AMS, and diarrhea.He was recently admitted for acute CHF and right  pleural effusion, discharged 8/22. Patient was hypotensive in the ED, requiring vasopressors, admitted to [facility] for management of septic shock secondary to possible pneumonia +/- C-diff. ED Course: Pertinent labs include WBC-15.3, BNP-753, lactic acid-3.0, creatinine 1.42, albumin-2.5. Chest x-ray shows increased airspace opacities throughout the right lung suspicious for infection or possible asymmetric edema and small right pleural effusion.   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, status post radiation, on chemotherapy4/6 cycles -acute on chronic CHF with volume overload - CAD, recent MI (March 2021), s/p stent placement - persistent A-flutter - severe mitral regurgitation - protein-calorie malnutrition, severe - history of right upper extremity DVT - history of seizure disorder  Recommendations/Plan: - Appreciate Tony Saunders speaking with this patient and family - patient is not a candidate for aggressive cardiac interventions at this point due to progressive deterioration and overall frail condition - patient is not a candidate for additional chemotherapy given his frail condition - patient and family seem to be more in favor of hospice care at home, but need more time to process the prognosis and make a decsion - PMT will continue to follow--plan to meet with wife and daughter tomorrow at bedside  Code Status:  DNR/DNI  Prognosis:   < 6 months  Discharge Planning:  To Be Determined  Thank you for allowing the Palliative Medicine Team to assist in the care of this patient.   Total Time 65 minutes Prolonged Time Billed  yes      Greater than 50%  of this time was spent counseling and coordinating care related to the above assessment and plan.  Tony Bullion, NP  Please contact Palliative Medicine Team phone at [phone] for questions and concerns.

## 2019-09-19 NOTE — Progress Notes (Signed)
NAME:  Tony Saunders, MRN:  [id number], DOB:  1955-07-13, LOS: 2 ADMISSION DATE:  08/29/2019, CONSULTATION DATE:  8/30 REFERRING MD: Quentin Cornwall, CHIEF COMPLAINT:  dyspnea  Brief History   64 y/o male with multiple medical problems was brought to the [facility] ED on 8/30 in the setting of fever, confusion, and circulatory shock presumably due to septic shock.  Past Medical History  CAD s/p STEMI/PCI 03/2019 Ischemic cardiomyopathy LVEF 35% Diffuse large B-cell lymphoma Persistent atrial fibrillation DVT RUE  Severe mitral regurgitation Tobacco abuse, quit 2019 Temporal lobe epilepsy  Significant Hospital Events   8/22 discharge CHF exacerbation, R pleural effusion  8/30 admission  Consults:    Procedures:  8/30 L IJ CVL >   Significant Diagnostic Tests:    Micro Data:  8/29 blood >  8/29 SARS COV 2 > negative 8/30 urine >  8/30 c diff >   Antimicrobials:  8/29 cefepime >  8/29 flagyl >  8/29 IV vanc > 8/31   Interim history/subjective:  Alert, confused. Pressors Pleataued. Flat CVP 15. LE swelling impressive.   Objective   Blood pressure 108/72, pulse 78, temperature 97.7 F (36.5 C), temperature source Oral, resp. rate (!) 27, height 6' (1.829 m), weight 83.8 kg, SpO2 99 %. CVP:  [12 mmHg-15 mmHg] 12 mmHg  FiO2 (%):  [40 %] 40 %   Intake/Output Summary (Last 24 hours) at 09/19/2019 0959 Last data filed at 09/19/2019 0900 Gross per 24 hour  Intake 1071.46 ml  Output 965 ml  Net 106.46 ml   Filed Weights   08/20/2019 2000 09/18/19 0500 09/19/19 0432  Weight: 81.5 kg 78.7 kg 83.8 kg    Examination: General: Very weak, cachectic, no accessory muscle use HENT: NCAT OP clear temporal wasting PULM: Rhonchi bilaterally CV: RRR, no mgr GI: BS+, soft, nontender MSK: normal bulk and tone Derm: significant bilateral leg edema, bruising left foot greater than right Neuro: awake, alert, no distress, MAEW   Resolved Hospital Problem list    Assessment & Plan:    Acute respiratory failure with hypoxemia: Presumably due to healthcare associated pneumonia, complicated by right pleural effusion, recurrent Administer O2 to maintain O2 saturation greater than 90% R sided effusion chronic, tapped 09/05/19 and transudate, likely related to chronic volume overload/hypoalbuminemia, hemodynamics improving, no role for thora/pigtail catheter now, re-evaluate in future as needed Continue IV cefepime, vanc d/c'd 8/31 (MRSA screen negative)  Septic shock: Presumably due to healthcare associated pneumonia Vasopressin restarted Wean Levophed for mean arterial pressure greater than 65, consider systolic goal given wide pulse pressure Continue cefepime and Flagyl for now, no diarrhea while admitted, plan to d-escalate at 48 hrs if cultures negative  Acute kidney injury in setting of septic shock: Cr trend down. Monitor BMET and UOP Replace electrolytes as needed  Acute on chronic systolic heart failure, question component of circulatory/cardiogenic shock: SvO2 64% 8/31, not overtly suggestive of cardiogenic source of shock though to high as expected in pure distributive shock, suspect depressed in setting of chronic presumed low output state from heart failure. Chronic volume overload on CXR  Atrial fibrillation Amiodarone Tele eliquis  History of temporal lobe epilepsy Continue Keppra Continue Lamictal  Anemia of chronic disease: Monitor for bleeding Transfuse PRBC for Hgb < 7 gm/dL  Generalized weakness PT consult if stabilizes  Best practice:  Diet: regular Pain/Anxiety/Delirium protocol (if indicated): n/a VAP protocol (if indicated): n/a DVT prophylaxis: eliquis GI prophylaxis: pantoprazole Glucose control: SSI Mobility: bed rest Code Status: DNR Family Communication: daily  at bedside Disposition: ICU  Labs   CBC: Recent Labs  Lab 09/09/2019 2006 09/17/19 0142 09/17/19 0529 09/17/19 2154 09/18/19 0536 09/18/19 1222 09/19/19 0500  WBC  15.3*  --   --  45.2* 33.7*  --  29.9*  NEUTROABS 14.4*  --   --   --  33.7*  --   --   HGB 10.1*   < > 8.8* 8.7* 8.2* 10.2* 7.8*  HCT 33.7*   < > 26.0* 28.9* 27.0* 30.0* 25.9*  MCV 94.1  --   --  94.8 93.4  --  94.2  PLT 122*  --   --  142* 112*  --  89*   < > = values in this interval not displayed.    Basic Metabolic Panel: Recent Labs  Lab 08/22/2019 2006 09/17/19 0142 09/17/19 0148 09/17/19 0529 09/18/19 0536 09/18/19 1222 09/19/19 0500  NA 137   < > 136 133* 133* 132* 132*  K 4.7   < > 4.3 3.8 3.8 3.8 3.8  CL 92*  --   --   --  95*  --  95*  CO2 31  --   --   --  26  --  26  GLUCOSE 70  --   --   --  103*  --  124*  BUN 19  --   --   --  24*  --  24*  CREATININE 1.42*  --   --   --  1.35*  --  1.29*  CALCIUM 8.5*  --   --   --  7.5*  --  7.7*   < > = values in this interval not displayed.   GFR: Estimated Creatinine Clearance: 65.2 mL/min (A) (by C-G formula based on SCr of 1.29 mg/dL (H)). Recent Labs  Lab 09/12/2019 2006 09/17/2019 2021 08/29/2019 2157 09/17/19 2154 09/18/19 0536 09/19/19 0500  WBC 15.3*  --   --  45.2* 33.7* 29.9*  LATICACIDVEN  --  3.0* 1.5  --   --   --     Liver Function Tests: Recent Labs  Lab 08/23/2019 2006 09/18/19 0536 09/19/19 0500  AST 26 61* 52*  ALT 19 41 43  ALKPHOS 86 75 90  BILITOT 0.6 1.2 0.9  PROT 4.7* 4.3* 4.8*  ALBUMIN 2.5* 2.5* 2.8*   No results for input(s): LIPASE, AMYLASE in the last 168 hours. No results for input(s): AMMONIA in the last 168 hours.  ABG    Component Value Date/Time   PHART 7.493 (H) 09/17/2019 0529   PCO2ART 39.1 09/17/2019 0529   PO2ART 98 09/17/2019 0529   HCO3 32.2 (H) 09/18/2019 1222   TCO2 34 (H) 09/18/2019 1222   ACIDBASEDEF 2.0 03/29/2019 1012   O2SAT 64.0 09/18/2019 1222     Coagulation Profile: Recent Labs  Lab 08/27/2019 2006  INR 1.7*    Cardiac Enzymes: No results for input(s): CKTOTAL, CKMB, CKMBINDEX, TROPONINI in the last 168 hours.  HbA1C: Hgb A1c MFr Bld    Date/Time Value Ref Range Status  03/27/2019 07:06 AM 5.3 4.8 - 5.6 % Final    Comment:    (NOTE) Pre diabetes:          5.7%-6.4% Diabetes:              >6.4% Glycemic control for   <7.0% adults with diabetes     CBG: Recent Labs  Lab 09/18/19 1121 09/18/19 1532 09/18/19 1946 09/18/19 2351 09/19/19 0345  GLUCAP 111* 111* 117* 140* 130*  CRITICAL CARE Performed by: Bonna Gains Kerem Gilmer   Total critical care time: 33 minutes  Critical care time was exclusive of separately billable procedures and treating other patients.  Critical care was necessary to treat or prevent imminent or life-threatening deterioration.  Critical care was time spent personally by me on the following activities: development of treatment plan with patient and/or surrogate as well as nursing, discussions with consultants, evaluation of patient's response to treatment, examination of patient, obtaining history from patient or surrogate, ordering and performing treatments and interventions, ordering and review of laboratory studies, ordering and review of radiographic studies, pulse oximetry and re-evaluation of patient's condition.

## 2019-09-19 DEATH — deceased

## 2019-09-20 ENCOUNTER — Inpatient Hospital Stay (HOSPITAL_COMMUNITY): Payer: No Typology Code available for payment source

## 2019-09-20 DIAGNOSIS — Z711 Person with feared health complaint in whom no diagnosis is made: Secondary | ICD-10-CM

## 2019-09-20 DIAGNOSIS — L89001 Pressure ulcer of unspecified elbow, stage 1: Secondary | ICD-10-CM

## 2019-09-20 DIAGNOSIS — Z515 Encounter for palliative care: Secondary | ICD-10-CM

## 2019-09-20 DIAGNOSIS — G9340 Encephalopathy, unspecified: Secondary | ICD-10-CM

## 2019-09-20 DIAGNOSIS — R52 Pain, unspecified: Secondary | ICD-10-CM

## 2019-09-20 DIAGNOSIS — I4819 Other persistent atrial fibrillation: Secondary | ICD-10-CM

## 2019-09-20 DIAGNOSIS — R1110 Vomiting, unspecified: Secondary | ICD-10-CM

## 2019-09-20 DIAGNOSIS — R0602 Shortness of breath: Secondary | ICD-10-CM

## 2019-09-20 DIAGNOSIS — T17908A Unspecified foreign body in respiratory tract, part unspecified causing other injury, initial encounter: Secondary | ICD-10-CM

## 2019-09-20 LAB — GLUCOSE, CAPILLARY: Glucose-Capillary: 129 mg/dL — ABNORMAL HIGH (ref 70–99)

## 2019-09-20 MED ORDER — MORPHINE SULFATE (PF) 2 MG/ML IV SOLN: 2.0000 mg | INTRAVENOUS | Status: DC | PRN

## 2019-09-20 MED ORDER — GLYCOPYRROLATE 1 MG PO TABS
1.0000 mg | ORAL_TABLET | ORAL | Status: DC | PRN
  Filled 2019-09-20: qty 1

## 2019-09-20 MED ORDER — MORPHINE BOLUS VIA INFUSION
2.0000 mg | INTRAVENOUS | Status: DC | PRN
  Filled 2019-09-20: qty 2

## 2019-09-20 MED ORDER — GLYCOPYRROLATE 0.2 MG/ML IJ SOLN: 0.2000 mg | INTRAMUSCULAR | Status: DC | PRN

## 2019-09-20 MED ORDER — LORAZEPAM 2 MG/ML IJ SOLN: 1.0000 mg | INTRAMUSCULAR | Status: DC | PRN

## 2019-09-20 MED ORDER — ACETAMINOPHEN 325 MG PO TABS: 650.0000 mg | ORAL_TABLET | Freq: Four times a day (QID) | ORAL | Status: DC | PRN

## 2019-09-20 MED ORDER — DEXTROSE 5 % IV SOLN: INTRAVENOUS | Status: DC

## 2019-09-20 MED ORDER — BIOTENE DRY MOUTH MT LIQD: 15.0000 mL | Freq: Two times a day (BID) | OROMUCOSAL | Status: DC

## 2019-09-20 MED ORDER — ONDANSETRON HCL 4 MG/2ML IJ SOLN: 4.0000 mg | Freq: Four times a day (QID) | INTRAMUSCULAR | Status: DC | PRN

## 2019-09-20 MED ORDER — MORPHINE 100MG IN NS 100ML (1MG/ML) PREMIX INFUSION: 1.0000 mg/h | INTRAVENOUS | Status: DC

## 2019-09-20 MED ORDER — MORPHINE 100MG IN NS 100ML (1MG/ML) PREMIX INFUSION
0.0000 mg/h | INTRAVENOUS | Status: DC
  Administered 2019-09-20: 5 mg/h via INTRAVENOUS
  Filled 2019-09-20: qty 100

## 2019-09-20 MED ORDER — MORPHINE BOLUS VIA INFUSION
2.0000 mg | INTRAVENOUS | Status: DC | PRN
  Administered 2019-09-20: 2 mg via INTRAVENOUS
  Filled 2019-09-20: qty 2

## 2019-09-20 MED ORDER — HALOPERIDOL LACTATE 5 MG/ML IJ SOLN: 2.5000 mg | INTRAMUSCULAR | Status: DC | PRN

## 2019-09-20 MED ORDER — HALOPERIDOL 1 MG PO TABS
2.0000 mg | ORAL_TABLET | ORAL | Status: DC | PRN
  Filled 2019-09-20: qty 2

## 2019-09-20 MED ORDER — POLYVINYL ALCOHOL 1.4 % OP SOLN: 1.0000 [drp] | Freq: Four times a day (QID) | OPHTHALMIC | Status: DC | PRN

## 2019-09-20 MED ORDER — OXYCODONE HCL 5 MG PO TABS: 5.0000 mg | ORAL_TABLET | ORAL | Status: DC | PRN

## 2019-09-20 MED ORDER — DIPHENHYDRAMINE HCL 50 MG/ML IJ SOLN: 25.0000 mg | INTRAMUSCULAR | Status: DC | PRN

## 2019-09-20 MED ORDER — ACETAMINOPHEN 650 MG RE SUPP: 650.0000 mg | Freq: Four times a day (QID) | RECTAL | Status: DC | PRN

## 2019-09-20 MED ORDER — HALOPERIDOL LACTATE 2 MG/ML PO CONC
2.0000 mg | ORAL | Status: DC | PRN
  Filled 2019-09-20: qty 1

## 2019-09-20 MED ORDER — POLYVINYL ALCOHOL 1.4 % OP SOLN
1.0000 [drp] | Freq: Four times a day (QID) | OPHTHALMIC | Status: DC | PRN
  Filled 2019-09-20: qty 15

## 2019-09-20 MED ORDER — HALOPERIDOL LACTATE 5 MG/ML IJ SOLN: 2.0000 mg | INTRAMUSCULAR | Status: DC | PRN

## 2019-09-20 MED ORDER — MORPHINE BOLUS VIA INFUSION
5.0000 mg | INTRAVENOUS | Status: DC | PRN
  Filled 2019-09-20: qty 5

## 2019-09-20 MED ORDER — ONDANSETRON 4 MG PO TBDP: 4.0000 mg | ORAL_TABLET | Freq: Four times a day (QID) | ORAL | Status: DC | PRN

## 2019-09-20 MED ORDER — SODIUM CHLORIDE 0.9 % IV SOLN
750.0000 mg | Freq: Two times a day (BID) | INTRAVENOUS | Status: DC
  Filled 2019-09-20 (×4): qty 7.5

## 2019-09-20 MED ORDER — HYDROCORTISONE NA SUCCINATE PF 100 MG IJ SOLR: 50.0000 mg | Freq: Two times a day (BID) | INTRAMUSCULAR | Status: DC

## 2019-09-20 MED ORDER — MORPHINE 100MG IN NS 100ML (1MG/ML) PREMIX INFUSION
0.0000 mg/h | INTRAVENOUS | Status: DC
  Administered 2019-09-21: 6 mg/h via INTRAVENOUS
  Filled 2019-09-20: qty 100

## 2019-09-20 MED ORDER — GLYCOPYRROLATE 1 MG PO TABS: 1.0000 mg | ORAL_TABLET | ORAL | Status: DC | PRN

## 2019-09-20 NOTE — Progress Notes (Signed)
Daily Progress Note   Patient Name: Tony Saunders       Date: 09/20/2019 DOB: 12-Jun-1955  Age: 64 y.o. MRN#: [id number] Attending Physician: Freddi Starr, MD Primary Care Physician: [name], [name], MD Admit Date: 09/18/2019  Reason for Consultation/Follow-up: Establishing goals of care  Subjective: Chart review performed. Received report from primary RN and Dr. Erin Fulling  - due to the patient's aspiration event overnight, continual need for pressors, and increased oxygen requirement due to work of breathing, they do not feel he is stable for travel/transition home at this time.  Went to visit patient at bedside - [name]/wife, Danni/daughter, and Nicole/daughter were present. Patient was lying in bed awake, alert, oriented, and able to participate in conversation. Patient is cachectic. On 15L HF [place]. No signs or non-verbal gestures of pain or discomfort noted. No respiratory distress, increased work of breathing, or secretions noted.   Continued goals of care conversation with patient and family today. Patient wanted to know why he was worse today than yesterday. Reviewed his current medical situation, including aspiration event overnight, physical fragility, and inability to wean off medications supporting his blood pressure.   Patient expressed his deep desire to go home and asked mutiple times why he couldn't. Discussed that if returning home was his wish, he could return home with hospice support; however, explained that he is not medically stable for transport and there is high risk that during the transition home he could pass away. We talked about transition to comfort measures in house and what that would entail inclusive of medications to control pain, dyspnea, agitation,  nausea, itching, and hiccups.  We discussed stopping all uneccessary measures such as blood draws, needle sticks, and frequent vital signs. We discussed that we would wean off oxygen to room air as tolerated and not escalate oxygen support (go back and forth between  and BiPAP) while on comfort measures, but rather utilize medications to support any discomfort or increased work of breathing.   Discussed [facility] current EOL visitation policy - 3 people at bedside. One of the main reasons he wanted to go home was so his family could all be with him at one time. Inquired who all he wanted present at his bedside and his son/Jimmy was one they desired to also be present in addition  to his wife and two daughters. Spoke with nursing staff who stated they could allow one more visitor for a total of 4 people at bedside only, once comfort measures were initiated. Updated the patient and family who were extremely appreciative. I also appreciate nursing staff's support.  Patient decided that he wanted to stay in the hospital and did not want to risk traveling home. Discussed that staying in the hospital would mean continuing aggressive interventions that he currently was receiving vs transitioning to comfort measure in house. Gave medical recommendation for comfort measures. Patient stated that he wanted to put BiPAP back on. Reiterated that for comfort care we would work to de-escalate oxygen support and manage work of breathing/dyspnea with medications. Patient was not ready to move forward with comfort measures at this time.   Utilized reflective listening and provided emotional support throughout our time together.   Addressed all questions/concerns. Encouraged to call with questions/concerns or if/when the patient decided to move forward with comfort measures. PMT card was provided.  2:50 PM Notified by RN that patient is ready to transition to comfort measures.  Returned to patient's bedside - wife,  two daughters, and son were present. Patient acknowledged that he was ready to transition to full comfort care in house. Outlined symptom management plan of care - patient and family expressed understanding.  Provided Gone from [place] [facility] book to family.   Emotional support provided.   Length of Stay: 3  Current Medications: Scheduled Meds:  . amiodarone  200 mg Oral Daily  . apixaban  5 mg Oral BID  . budesonide  0.5 mg Nebulization BID  . Chlorhexidine Gluconate Cloth  6 each Topical Daily  . hydrocortisone sod succinate (SOLU-CORTEF) inj  50 mg Intravenous Q12H  . lamoTRIgine  100 mg Oral BID  . levETIRAcetam  750 mg Oral BID  . mouth rinse  15 mL Mouth Rinse BID  . sodium chloride flush  10-40 mL Intracatheter Q12H    Continuous Infusions: . sodium chloride    . sodium chloride    . ceFEPime (MAXIPIME) IV Stopped (09/20/19 0737)  . norepinephrine (LEVOPHED) Adult infusion Stopped (09/19/19 1813)  . vasopressin 0.03 Units/min (09/20/19 1000)    PRN Meds: Place/Maintain arterial line **AND** sodium chloride, Place/Maintain arterial line **AND** sodium chloride, docusate sodium, HYDROmorphone (DILAUDID) injection, ipratropium-albuterol, oxyCODONE, polyethylene glycol, sodium chloride flush  Physical Exam Constitutional:      General: He is not in acute distress.    Appearance: He is cachectic. He is ill-appearing.  Pulmonary:     Effort: No respiratory distress.  Skin:    General: Skin is cool and dry.     Findings: Ecchymosis present.  Neurological:     Mental Status: He is alert.     Motor: Weakness present.  Psychiatric:        Behavior: Behavior is cooperative.        Cognition and Memory: Cognition normal.             Vital Signs: BP 114/65   Pulse 71   Temp (!) 97 F (36.1 C) (Oral)   Resp (!) 31   Ht 6' (1.829 m)   Wt 83.8 kg   SpO2 100%   BMI 25.06 kg/m  SpO2: SpO2: 100 % O2 Device: O2 Device: Bi-PAP O2 Flow Rate: O2 Flow Rate (L/min): 15  L/min  Intake/output summary:   Intake/Output Summary (Last 24 hours) at 09/20/2019 1232 Last data filed at 09/20/2019 1000 Gross per 24 hour  Intake 591.47 ml  Output 900 ml  Net -308.53 ml   LBM: Last BM Date: 10/17/19 Baseline Weight: Weight: 81.5 kg Most recent weight: Weight: 83.8 kg       Palliative Assessment/Data: PPS 20%    Flowsheet Rows     Most Recent Value  Intake Tab  Referral Department Critical care (P)   Unit at Time of Referral ICU (P)   Palliative Care Primary Diagnosis Cancer (P)   Date Notified 09/17/19 (P)   Palliative Care Type New Palliative care (P)   Reason for referral Clarify Goals of Care (P)   Date of Admission 09/12/2019 (P)   Date first seen by Palliative Care 09/17/19 (P)   # of days Palliative referral response time 0 Day(s) (P)   # of days IP prior to Palliative referral 1 (P)   Clinical Assessment  Psychosocial & Spiritual Assessment  Palliative Care Outcomes      Patient Active Problem List   Diagnosis Date Noted  . Palliative care by specialist   . Goals of care, counseling/discussion   . Septic shock ([place]) 09/17/2019  . Hospital-acquired pneumonia 09/14/2019  . AKI (acute kidney injury) ([place]) 09/05/2019  . Lactic acidosis 09/05/2019  . Acute on chronic respiratory failure with hypoxia ([place]) 09/13/2019  . Immunocompromised patient ([place]) 08/30/2019  . Diarrhea 09/15/2019  . Pleural effusion 09/03/2019  . DVT (deep venous thrombosis) ([place]) 08/30/2019  . Neutropenic fever ([place]) 08/29/2019  . Port-A-Cath in place 07/30/2019  . Symptomatic anemia   . Persistent atrial fibrillation ([place])   . Cardiogenic shock ([place]) 07/08/2019  . Acute respiratory failure with hypoxia ([place]) 07/08/2019  . Severe sepsis ([place]) 07/08/2019  . Neutropenic sepsis ([place]) 07/08/2019  . Febrile neutropenia ([place])   . Lymphoma ([place])   . Pulmonary infiltrate   . Hypotension   . Protein-calorie malnutrition, severe 06/21/2019  . Atrial flutter with rapid  ventricular response ([place])   . Pressure injury of skin 06/16/2019  . Oropharyngeal candidiasis 06/16/2019  . Hypokalemia 06/16/2019  . Ileus ([place]) 06/16/2019  . Pancytopenia ([place]) 06/15/2019  . Acute on chronic systolic heart failure ([place]) 06/13/2019  . 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 Atrial fibrillation with RVR ([place]) 05/23/2019  . Left leg swelling   . PAF (persistent-paroxysmal atrial fibrillation) ([place]) 04/16/2019  . Ischemic cardiomyopathy 04/16/2019  . Severe mitral regurgitation by prior echocardiogram-likely ischemic with tethered posterior leaflet 03/31/2019  . Elevated transaminase level 03/31/2019  . 3vessel CAD- S/P PCI 03/27/2019  . Acute ST elevation myocardial infarction (STEMI) of inferior wall ([place]) 03/27/2019  . Hyperlipidemia with target LDL less than 70 03/27/2019  . 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 Patient was hypotensive in the ED, requiring vasopressors, admitted to [facility] for management of septic shock secondary to possible pneumonia +/- C-diff. ED Course: Pertinent labs include WBC-15.3, BNP-753, lactic acid-3.0, creatinine 1.42, albumin-2.5. Chest x-ray shows increased airspace opacities throughout the right lung suspicious for infection or possible asymmetric edema and small right pleural effusion.  Assessment: Acute respiratory failure with hypoxemia Healthcare associated pneumonia Recurrent right pleural effusion and aspiration Septic shock due to HCAP Acute kidney injury in  the setting of septic  shock Acute on chronic systolic heart failure Atrial fibrillation Generalized weakness Large B-cell lymphoma CAD Severe protein-calorie malnutrition  Recommendations/Plan: Initiated full comfort measures Continue DNR/DNI as previously documented Patient is not medically stable for discharge home with hospice; anticipate hospital death Added orders to ensure EOL comfort and to reflect full comfort measures, as well as discontinued orders that were not focused on comfort  -Dyspnea/mod-severe pain: Continuous morphine 1mg /hr with 2mg  q39min PRN bolus  -Fever/mild pain:   Tylenol 650mg  PO/PR q6h PRN  -Agitation/deliruim:   Haloperidol 2mg  tab/soln/IV q4h PRN  -Anxiety:    Lorazepam 1mg  IV  q4h PRN  -Nausea:     Zofran 4mg  tab/IV q6h PRN     -Secretions:    Glycopyrrolate 0.2mg  [place]/IV or 1mg  PO q4h PRN  -Dry Eyes:    Liquifilm Tears 1 drop both eyes 4 times daily PRN  -Xerostomia:    Biotene 48ml BID   -Urinary Retention:   Bladder scan if > 263ml place and maintain foley   -Oxygen:    Wean patient to room air - do not escalate  -Diet:     Comfort feeds from floor stock, regular diet, careful hand feed  -Transfer orders placed:   6N  -Discontinued:    Levophed, antibiotics, IVF, cardiac monitoring  -Appreciate pharmacy assistance with converting Keppra PO to IV Can consider increasing continuous morphine to 2mg /hr if patient requires more than 3 boluses within 1 hour Unrestricted visitation orders were placed per current [place] EOL visitation policy. Appreciate nursing allowing 4 family members at bedside for EOL per patient's last wishes Provide frequent assessments and administer PRN medications as clinically necessary to ensure EOL comfort  PMT will continue to follow holistically  Goals of Care and Additional Recommendations:  Limitations on Scope of Treatment: Full Comfort Care  Code Status:    Code Status Orders  (From admission, onward)         Start      Ordered   09/17/19 1224  Do not attempt resuscitation (DNR)  Continuous       Question Answer Comment  Maintain current active treatments Yes   Do not initiate new interventions Yes      09/17/19 1224        Code Status History    Date Active Date Inactive Code Status Order ID Comments User Context   09/17/2019 0036 09/17/2019 1224 Full Code [id number]  Gleason, Sissy Hoff ED   08/29/2019 1500 09/09/2019 2132 Full Code [id number]  [place], Amaya, DO ED   07/13/2019 1104 07/25/2019 2059 Partial Code [id number]  Marshell Garfinkel, MD Inpatient   07/08/2019 1708 07/13/2019 1104 Full Code [id number]  Tanda Rockers, MD ED   06/15/2019 1825 06/30/2019 2253 Full Code [id number]  Samuella Cota, MD Inpatient   05/23/2019 0334 05/25/2019 1548 Full Code [id number]  Rise Patience, MD ED   03/29/2019 1156 03/31/2019 1735 Full Code [id number]  Belva Crome, MD Inpatient   03/27/2019 1017 03/29/2019 1155 Full Code [id number]  Leonie Man, MD Inpatient   Advance Care Planning Activity       Prognosis:   Hours - Days  Discharge Planning:  Anticipated Hospital Death  Care plan was discussed with primary RN, Dr. Erin Fulling, patient, family, pharmacy  Thank you for allowing the Palliative Medicine Team to assist in the care of this patient.   Total Time 120 minutes Prolonged Time Billed  yes  Greater than 50%  of this time was spent counseling and coordinating care related to the above assessment and plan.  Lin Landsman, NP  Please contact Palliative Medicine Team phone at [phone] for questions and concerns.

## 2019-09-20 NOTE — Progress Notes (Signed)
eLink Physician-Brief Progress Note Patient Name: Tony Saunders DOB: 02-10-1955 MRN: [id number]   Date of Service  09/20/2019  HPI/Events of Note  Patient vomited and aspirated into his airway. Increased O2 requirement. Patient is DNR/DNI. Patient is currently on Cefepime.   eICU Interventions  Plan: 1. NT suction PRN.  2. Portable CXR STAT.      Intervention Category Major Interventions: Other:  Delainie Chavana Cornelia Copa 09/20/2019, 3:11 AM

## 2019-09-20 NOTE — Progress Notes (Signed)
Tony Saunders is a 63 y.o. male patient admitted. Awake, alert - oriented  X 4 - no acute distress noted.  VSS - Blood pressure 114/65, pulse 75, temperature (!) 97 F (36.1 C), temperature source Oral, resp. rate (!) 33, height 6' (1.829 m), weight 83.8 kg, SpO2 (!) 85 %.    IV in place, occlusive dsg intact without redness.  Orientation to room, and floor completed.  Admission INP armband ID verified with patient/family, and in place.   SR up x 2, fall assessment complete, with patient and family able to verbalize understanding of risk associated with falls, and verbalized understanding to call nsg before up out of bed.  Call light within reach, patient able to voice, and demonstrate understanding.Scattered bruises and skin tears noted and secured with dressing.  Admission nurse notified of admission.     Will cont to eval and treat per MD orders.  [name], RN 09/20/2019 9:24 PM

## 2019-09-20 NOTE — Progress Notes (Addendum)
Advanced Heart Failure Rounding Note  PCP-Cardiologist: Glenetta Hew, MD  [facility]: Dr. Haroldine Laws   Patient Profile   64 y/o male with recently diagnosed lymphoma as well as CAD with recent Mi and ischemic CM and severe mitral regurgitation. Multiple recent admits for varius issues and failure to thrive. Now readmitted with septic shock and bilateral PNA, requiring pressor support and c/b acute on chronic systolic heart failure w/ marked volume overload.    Subjective:    AF overnight. WBC trending down, 45>>33>>29K.   Remains on NE 2+ VP 0.03. Co-ox marginal, 57%.   hgb 7.8.  SCr improving, 1.4>>1.29   Remains SOB, on NRB. Concern for aspiration event overnight, after large amounts of emesis. Repeat CXR this morning showed marked increased consolidation at the right lung base c/w progressive PNA.     Objective:   Weight Range: 83.8 kg Body mass index is 25.06 kg/m.   Vital Signs:   Temp:  [96.5 F (35.8 C)-97.9 F (36.6 C)] 97.7 F (36.5 C) (09/02 0400) Pulse Rate:  [59-239] 239 (09/02 0600) Resp:  [18-41] 25 (09/02 0645) BP: (82-114)/(61-75) 90/62 (09/01 1600) SpO2:  [71 %-100 %] 90 % (09/02 0645) Arterial Line BP: (95-138)/(50-76) 119/68 (09/02 0645) Weight:  [83.8 kg] 83.8 kg (09/02 0500) Last BM Date: 10/17/19  Weight change: Filed Weights   09/18/19 0500 09/19/19 0432 09/20/19 0500  Weight: 78.7 kg 83.8 kg 83.8 kg    Intake/Output:   Intake/Output Summary (Last 24 hours) at 09/20/2019 5956 Last data filed at 09/20/2019 0600 Gross per 24 hour  Intake 578.22 ml  Output 700 ml  Net -121.78 ml      Physical Exam    General:  Chronically ill, cachectic/fatigue appearing. Increased WOB requiring NRB HEENT: Normal  Neck: Supple. JVP elevated to jaw . Carotids 2+ bilat; no bruits. No lymphadenopathy or thyromegaly appreciated. Cor: PMI nondisplaced. Irregularly irregular rhythm, regular rate. 3/6 MR murmur at apex Lungs: diminished BS bilaterally R>L +  bibasilar crackles  Abdomen: Soft, nontender, nondistended. No hepatosplenomegaly. No bruits or masses. Good bowel sounds. Extremities: No cyanosis, clubbing, rash, 3+ bilateral LE edema + unna boots  Neuro: Alert & orientedx3, cranial nerves grossly intact. moves all 4 extremities w/o difficulty. Affect pleasant   Telemetry   Atrial fibrillation 80s   EKG    No new EKG to review today   Labs    CBC Recent Labs    09/18/19 0536 09/18/19 0536 09/18/19 1222 09/19/19 0500  WBC 33.7*  --   --  29.9*  NEUTROABS 33.7*  --   --   --   HGB 8.2*   < > 10.2* 7.8*  HCT 27.0*   < > 30.0* 25.9*  MCV 93.4  --   --  94.2  PLT 112*  --   --  89*   < > = values in this interval not displayed.   Basic Metabolic Panel Recent Labs    09/18/19 0536 09/18/19 0536 09/18/19 1222 09/19/19 0500  NA 133*   < > 132* 132*  K 3.8   < > 3.8 3.8  CL 95*  --   --  95*  CO2 26  --   --  26  GLUCOSE 103*  --   --  124*  BUN 24*  --   --  24*  CREATININE 1.35*  --   --  1.29*  CALCIUM 7.5*  --   --  7.7*   < > = values in  this interval not displayed.   Liver Function Tests Recent Labs    09/18/19 0536 09/19/19 0500  AST 61* 52*  ALT 41 43  ALKPHOS 75 90  BILITOT 1.2 0.9  PROT 4.3* 4.8*  ALBUMIN 2.5* 2.8*   No results for input(s): LIPASE, AMYLASE in the last 72 hours. Cardiac Enzymes No results for input(s): CKTOTAL, CKMB, CKMBINDEX, TROPONINI in the last 72 hours.  BNP: BNP (last 3 results) Recent Labs    07/14/19 0500 07/16/19 0629 08/29/2019 2006  BNP 608.8* 675.9* 753.3*    ProBNP (last 3 results) No results for input(s): PROBNP in the last 8760 hours.   D-Dimer No results for input(s): DDIMER in the last 72 hours. Hemoglobin A1C No results for input(s): HGBA1C in the last 72 hours. Fasting Lipid Panel No results for input(s): CHOL, HDL, LDLCALC, TRIG, CHOLHDL, LDLDIRECT in the last 72 hours. Thyroid Function Tests No results for input(s): TSH, T4TOTAL, T3FREE,  THYROIDAB in the last 72 hours.  Invalid input(s): FREET3  Other results:   Imaging    DG CHEST PORT 1 VIEW  Result Date: 09/20/2019 CLINICAL DATA:  Aspiration EXAM: PORTABLE CHEST 1 VIEW COMPARISON:  09/17/2019 FINDINGS: Single frontal view of the chest demonstrates stable left internal jugular catheter and right chest wall port. The cardiac silhouette is stable. Diffuse bilateral airspace disease, with increased consolidation at the right lung base. Stable right pleural effusion. No pneumothorax. IMPRESSION: 1. Continued bilateral airspace disease, with marked increased consolidation at the right lung base. This could reflect progressive pneumonia or sequela of aspiration. Electronically Signed   By: Randa Ngo M.D.   On: 09/20/2019 03:38      Medications:     Scheduled Medications: . amiodarone  200 mg Oral Daily  . apixaban  5 mg Oral BID  . budesonide  0.5 mg Nebulization BID  . Chlorhexidine Gluconate Cloth  6 each Topical Daily  . hydrocortisone sod succinate (SOLU-CORTEF) inj  50 mg Intravenous Q6H  . lamoTRIgine  100 mg Oral BID  . levETIRAcetam  750 mg Oral BID  . mouth rinse  15 mL Mouth Rinse BID  . sodium chloride flush  10-40 mL Intracatheter Q12H     Infusions: . sodium chloride    . sodium chloride    . ceFEPime (MAXIPIME) IV 2 g (09/20/19 0530)  . norepinephrine (LEVOPHED) Adult infusion 2 mcg/min (09/19/19 1600)  . vasopressin 0.03 Units/min (09/20/19 0426)     PRN Medications:  Place/Maintain arterial line **AND** sodium chloride, Place/Maintain arterial line **AND** sodium chloride, docusate sodium, HYDROmorphone (DILAUDID) injection, ipratropium-albuterol, oxyCODONE, polyethylene glycol, sodium chloride flush   Assessment/Plan   1.  Mixed Shock  - primarily septic shock 2/2 HCAP + component of cardiogenic shock, Co-ox marginal at 57% on NE - continues on abx + pressors support - WBC 45>>33>>29K, AF overnight - Bld Cx- NGTD - PNA worsening  on repeat CXR this am, suspected aspiration overnight w/ large emesis - remains on NE + VP. Add Midodrine to facilitate pressor wean. MAPs 70s   2. Acute Hypoxic Respiratory Failure--> presumed HCAP - On antibiotics, cefepime.  - requiring NRB  3. A/C Systolic HF, ICM with severe MR - EF 35-40% by echo 06/24/19 - hypotension has limited aggressive diuresis  - add midodrine to support BP to help facilitate IV pressor wean - once pressor requirement down can start diuresing w/ IV Lasix - continue unna boots  - not a candidate for advanced therapies w/ lymphoma and poor prognosis  4. AKI  - Baseline creatinine < 1.  - 1.4 on admit - AKI in the setting of septic shock/hypotension  - improving w/ volume resuscitation/ pressor support, 1.2 today  5. A fib, chronic - failed multiple cardioverisons.  - rate controlled in the 80s  - on Eliquis for a/c  6. CAD s/p recent MI with multiple stents in 3/21 (RCA x 2, LCX x 3, LAD x 1) - No chest pain.  - no ASA w/ Eliquis (DVT)  - Intolerant to statins.  - No bb with shock .   7. Severe MR - not a candidate for MV repair/ replacement    8. Goals of Care - DNR/DNI  - overall prognosis very poor - palliative care following - family considering hospice at home      Length of Stay: [address] Ladoris Gene  09/20/2019, 7:12 AM  Advanced Heart Failure Team Pager [phone] (M-F; [place])  Please contact [facility] for night-coverage after hours (4p -7a ) and weekends on [email or web address]  Agree with above.   He had a bad night with aspiration event and worsening respiratory distress and increased pressor requirements. Initially on bipap and now on NRB. Remains in ICU on MSO4 at 1/hr.   General:  Terminally-ill appearing. On NRB HEENT: normal + temporal wasting Neck: supple. JVP to jaw. Carotids 2+ bilat; no bruits. No lymphadenopathy or thryomegaly appreciated. Cor: PMI nondisplaced. Irregular  Lungs: diffuse  crackles Abdomen: soft, nontender, nondistended. No hepatosplenomegaly. No bruits or masses. Good bowel sounds. Extremities: no cyanosis, clubbing, rash, 3+ edema weeping wound on left Neuro: alert & orientedx3, cranial nerves grossly intact. moves all 4 extremities w/o difficulty. Affect pleasant  I had long talk with him and his family he tells me that he is done suffering. He is on morphine drip and remains uncomfortable. We discussed increasing the drip or keeping at the same dose so he can spend more time with his family. He wants a little more time. I will check back with him later. I told him we could increase the drip whenever he was ready.   CCT 40 mins .  Glori Bickers, MD  7:35 PM

## 2019-09-20 NOTE — Progress Notes (Deleted)
Family at bedside giving sips of drink to pt while on bipap. Family was educated by this RN of the high risk of aspiration when doing that. Family is aware and would like to fulfill dying pt's wishes, despite risk involved. MD notified.   Bath and dressing changes held for now due to instability of pt.

## 2019-09-20 NOTE — Progress Notes (Signed)
Pt has decided to transition fully to comfort care. Family at bedside, appropriate and supportive. Will continue to monitor.

## 2019-09-20 NOTE — Progress Notes (Signed)
Family at bedside giving sips of drink to pt while on bipap. Family was educated by this RN of the high risk of aspiration when doing that. Family is aware and would like to fulfill dying pt's wishes, despite risk involved. MD notified.   Bath and dressing changes held for now due to instability of pt.

## 2019-09-20 NOTE — Progress Notes (Signed)
Pt had large amount of brown emesis with high suspicion for aspiration, requiring increase from [place] to 100%NRB, unable to obtain SpO2. Stat NTSX per CCM order for large amount of thick brown secretions, tolerated fair.  Stat CXR obtained.  Will continue to monitor.

## 2019-09-20 NOTE — Progress Notes (Addendum)
NAME:  Tony Saunders, MRN:  [id number], DOB:  January 29, 1955, LOS: 3 ADMISSION DATE:  09/14/2019, CONSULTATION DATE:  8/30 REFERRING MD: Quentin Cornwall, CHIEF COMPLAINT:  dyspnea  Brief History   64 y/o male with multiple medical problems was brought to the [facility] ED on 8/30 in the setting of fever, confusion, and circulatory shock presumably due to septic shock.  Past Medical History  CAD s/p STEMI/PCI 03/2019 Ischemic cardiomyopathy LVEF 35% Diffuse large B-cell lymphoma Persistent atrial fibrillation DVT RUE  Severe mitral regurgitation Tobacco abuse, quit 2019 Temporal lobe epilepsy  Significant Hospital Events   8/22 discharge CHF exacerbation, R pleural effusion  8/30 admission 9/1 vomiting with aspiration event leading to increased oxygen requirements Consults:  Advanced Heart Failure  Procedures:  8/30 L IJ CVL >   Significant Diagnostic Tests:    Micro Data:  8/29 blood >  8/29 SARS COV 2 > negative 8/30 urine >  8/30 c diff >   Antimicrobials:  8/29 cefepime >>  8/29 flagyl > 8/31 8/29 IV vanc > 8/31   Interim history/subjective:  Overnight patient vomited leading to an aspiration event. Chest radiograph shows right lower lobe and middle lobe invovlement. He is requesting Bipap this morning due to increased work of breathing.  Objective   Blood pressure 90/62, pulse 77, temperature (!) 97 F (36.1 C), temperature source Oral, resp. rate (!) 24, height 6' (1.829 m), weight 83.8 kg, SpO2 100 %.    FiO2 (%):  [100 %] 100 %   Intake/Output Summary (Last 24 hours) at 09/20/2019 1133 Last data filed at 09/20/2019 1000 Gross per 24 hour  Intake 603.46 ml  Output 900 ml  Net -296.54 ml   Filed Weights   09/18/19 0500 09/19/19 0432 09/20/19 0500  Weight: 78.7 kg 83.8 kg 83.8 kg    Examination: General: cachectic and chronically ill appearing HENT: NCAT OP clear temporal wasting PULM: course breath sounds, Rhonchi bilaterally, accessory muscle use present CV:  irregularly irregular, systolic murmur present GI: BS+, soft, nontender MSK: reduced bulk and tone Derm: significant bilateral leg edema, bruising left foot greater than right Neuro: awake, alert, mild distress   Resolved Hospital Problem list    Assessment & Plan:  Acute respiratory failure with hypoxemia: Presumably due to healthcare associated pneumonia, complicated by recurrent right pleural effusion and aspiration - Add bipap on this AM for work of breathing. Risks of bipap explained to patient and daughter at bedside - Goal O2 sats greater than 90% - R sided effusion chronic, tapped 09/05/19 and transudate, likely related to chronic volume overload/hypoalbuminemia - Continue IV cefepime, vanc d/c'd 8/31 (MRSA screen negative)  Septic shock: Presumably due to healthcare associated pneumonia - Remains on levophed and vasopressin - Stress dose steroids initiated on 8/31, will start to taper today - Antibiotic coverage with cefepime - Vancomycin and flagyl have been discontinued based on culture data  Acute kidney injury in setting of septic shock: Cr trend down. Monitor BMET and UOP Replace electrolytes as needed  Acute on chronic systolic heart failure, question component of circulatory/cardiogenic shock:  - SvO2 64% 8/31, not overtly suggestive of cardiogenic source of shock though to high as expected in pure distributive shock, suspect depressed in setting of chronic presumed low output state from heart failure. - Chronic volume overload on CXR - Advanced Heart Failure team following, appreciate recommendations  Atrial fibrillation Amiodarone Tele eliquis  History of temporal lobe epilepsy Continue Keppra Continue Lamictal  Anemia of chronic disease: Monitor for bleeding  Transfuse PRBC for Hgb < 7 gm/dL  Generalized weakness PT consult if stabilizes  Disposition: Had multiple discussions with family at the bedside this morning. We discussed the difficulty of Tony Saunders  returning home safely for hospice care. We discussed the potential for hospice care while inpatient. They will be meeting with the palliative care team today.   Best practice:  Diet: regular Pain/Anxiety/Delirium protocol (if indicated): n/a VAP protocol (if indicated): n/a DVT prophylaxis: eliquis GI prophylaxis: pantoprazole Glucose control: SSI Mobility: bed rest Code Status: DNR Family Communication: daily at bedside Disposition: ICU  Labs   CBC: Recent Labs  Lab 09/06/2019 2006 09/17/19 0142 09/17/19 0529 09/17/19 2154 09/18/19 0536 09/18/19 1222 09/19/19 0500  WBC 15.3*  --   --  45.2* 33.7*  --  29.9*  NEUTROABS 14.4*  --   --   --  33.7*  --   --   HGB 10.1*   < > 8.8* 8.7* 8.2* 10.2* 7.8*  HCT 33.7*   < > 26.0* 28.9* 27.0* 30.0* 25.9*  MCV 94.1  --   --  94.8 93.4  --  94.2  PLT 122*  --   --  142* 112*  --  89*   < > = values in this interval not displayed.    Basic Metabolic Panel: Recent Labs  Lab 09/02/2019 2006 09/17/19 0142 09/17/19 0148 09/17/19 0529 09/18/19 0536 09/18/19 1222 09/19/19 0500  NA 137   < > 136 133* 133* 132* 132*  K 4.7   < > 4.3 3.8 3.8 3.8 3.8  CL 92*  --   --   --  95*  --  95*  CO2 31  --   --   --  26  --  26  GLUCOSE 70  --   --   --  103*  --  124*  BUN 19  --   --   --  24*  --  24*  CREATININE 1.42*  --   --   --  1.35*  --  1.29*  CALCIUM 8.5*  --   --   --  7.5*  --  7.7*   < > = values in this interval not displayed.   GFR: Estimated Creatinine Clearance: 65.2 mL/min (A) (by C-G formula based on SCr of 1.29 mg/dL (H)). Recent Labs  Lab 09/18/2019 2006 08/28/2019 2021 09/11/2019 2157 09/17/19 2154 09/18/19 0536 09/19/19 0500  WBC 15.3*  --   --  45.2* 33.7* 29.9*  LATICACIDVEN  --  3.0* 1.5  --   --   --     Liver Function Tests: Recent Labs  Lab 08/29/2019 2006 09/18/19 0536 09/19/19 0500  AST 26 61* 52*  ALT 19 41 43  ALKPHOS 86 75 90  BILITOT 0.6 1.2 0.9  PROT 4.7* 4.3* 4.8*  ALBUMIN 2.5* 2.5* 2.8*   No  results for input(s): LIPASE, AMYLASE in the last 168 hours. No results for input(s): AMMONIA in the last 168 hours.  ABG    Component Value Date/Time   PHART 7.493 (H) 09/17/2019 0529   PCO2ART 39.1 09/17/2019 0529   PO2ART 98 09/17/2019 0529   HCO3 32.2 (H) 09/18/2019 1222   TCO2 34 (H) 09/18/2019 1222   ACIDBASEDEF 2.0 03/29/2019 1012   O2SAT 57.2 09/19/2019 1132     Coagulation Profile: Recent Labs  Lab 08/31/2019 2006  INR 1.7*    Cardiac Enzymes: No results for input(s): CKTOTAL, CKMB, CKMBINDEX, TROPONINI in the last 168 hours.  HbA1C: Hgb A1c MFr Bld  Date/Time Value Ref Range Status  03/27/2019 07:06 AM 5.3 4.8 - 5.6 % Final    Comment:    (NOTE) Pre diabetes:          5.7%-6.4% Diabetes:              >6.4% Glycemic control for   <7.0% adults with diabetes     CBG: Recent Labs  Lab 09/18/19 1946 09/18/19 2351 09/19/19 0345 09/19/19 1137 09/20/19 0000  GLUCAP 117* 140* 130* 138* 129*     CRITICAL CARE Performed by: Freddi Starr   Total critical care time: 50 minutes  Critical care time was exclusive of separately billable procedures and treating other patients.  Critical care was necessary to treat or prevent imminent or life-threatening deterioration.  Critical care was time spent personally by me on the following activities: development of treatment plan with patient and/or surrogate as well as nursing, discussions with consultants, evaluation of patient's response to treatment, examination of patient, obtaining history from patient or surrogate, ordering and performing treatments and interventions, ordering and review of laboratory studies, ordering and review of radiographic studies, pulse oximetry and re-evaluation of patient's condition.  Freda Jackson, MD [facility] Office: [phone]   See Amion for Pager Details   Addendum: Patient and family have decided to move forward with comfort measures. Orders  have been placed with the comfort measures order set.   Freda Jackson, MD

## 2019-09-21 LAB — CULTURE, BLOOD (ROUTINE X 2)
Culture: NO GROWTH
Special Requests: ADEQUATE

## 2019-09-22 LAB — CULTURE, BLOOD (ROUTINE X 2)
Culture: NO GROWTH
Special Requests: ADEQUATE

## 2019-09-25 ENCOUNTER — Inpatient Hospital Stay: Payer: No Typology Code available for payment source | Admitting: Internal Medicine

## 2019-09-26 ENCOUNTER — Encounter (HOSPITAL_COMMUNITY): Payer: No Typology Code available for payment source

## 2019-10-01 ENCOUNTER — Ambulatory Visit: Payer: No Typology Code available for payment source

## 2019-10-01 ENCOUNTER — Ambulatory Visit: Payer: No Typology Code available for payment source | Admitting: Oncology

## 2019-10-01 ENCOUNTER — Other Ambulatory Visit: Payer: No Typology Code available for payment source

## 2019-10-03 ENCOUNTER — Ambulatory Visit: Payer: No Typology Code available for payment source

## 2019-10-08 ENCOUNTER — Ambulatory Visit: Payer: No Typology Code available for payment source

## 2019-10-08 ENCOUNTER — Other Ambulatory Visit: Payer: No Typology Code available for payment source

## 2019-10-08 ENCOUNTER — Ambulatory Visit: Payer: No Typology Code available for payment source | Admitting: Physician Assistant

## 2019-10-10 ENCOUNTER — Ambulatory Visit: Payer: No Typology Code available for payment source

## 2019-10-19 NOTE — Progress Notes (Signed)
The patient is a 64 yr old man who was admitted to the ICU in Septic Shock on 08/29/2019. He has acute hypoxic respiratory failure, due to HCAP with recurrent right pleural effusion, and aspiration. He has also suffered AKI and acute on chronic systolic heart failure, and atrial fibrillation. He has a past medical history significant for lymphoma for which he was getting chemotherapy and failure to thrive. Palliative care has been consulted and the patient is being transferred out of the ICU to the care of [facility] for comfort care.

## 2019-10-19 NOTE — Discharge Summary (Signed)
Physician Discharge Summary  Tony Saunders IEP:[id number] DOB: 12-11-1955 DOA: October 04, 2019  PCP: Tony Saunders, Tony Halsted, MD  Admit date: Oct 04, 2019 Date and time of death: 2019-10-09, 0800   Diagnoses at Time of Death: Principal diagnosis is #1 1. Acute hypoxic respiratory failue 2. Recurrently Right Pleural Effusion 3. Aspiration Pneumonia 4. AKI 5. Acute on chronic systolic heart failure 6. Atrial fibrillaion 7. Lymphoma 8. Failure to thrive  Discharge Condition: Deceased  Filed Weights   10-06-19 0500 09/19/19 0432 October 08, 2019 0500  Weight: 78.7 kg 83.8 kg 83.8 kg   History of present illness: 64 y.o. M with PMH of lymphoma on chemotherapy, recent DVT and Afib on Eliquis, CAD with STEMI 03/2019, HFrEF, seizures who was recently admitted for HF exacerbation with R pleural effusion and discharged 8/22.  He was brought in for fever and confusion, required pressors for hypotension, so PCCM consulted for admission. Tony Saunders is a 64 y.o. M with PMH significant for  lymphoma on chemotherapy, recent DVT and Afib on Eliquis, CAD with STEMI 03/2019, HFrEF, seizures who was recently admitted for HF exacerbation with R pleural effusion (over 2L removed by thoracentesis) and discharged 8/22.  He has had liquid stool for approximately one week per wife, he has complained of generalized pain with some diffuse abdominal pain.  During last admission he received Vancomycin and Cefepime.  No nausea or vomiting.  Pt also notes persistent cough for over one week, though this seems to be improving per his wife.  He was discharged home on 2L home O2, per EMS was hypoxic on their arrival and placed on non-rebreather, but now stabilized back on [place].   On the day of admission, pt's wife noted fever of 107F with some confusion, so called EMS  In the ED, pt was tachycardic and hypotensive below baseline SBP in the 90's.  CXR with increased opacities in the R lung, WBC 15k without neutropenia, creatinine 1.4  above baseline of <1.0. He was given Ceftriaxone, Vancomycin and Flagyl and 1L IVF with continued hypotension so PCCM consulted for admission  Hospital Course: The patient is a 63 yr old man who was admitted to the ICU in Septic Shock on 04-Oct-2019. He has acute hypoxic respiratory failure, due to HCAP with recurrent right pleural effusion, and aspiration. He has also suffered AKI and acute on chronic systolic heart failure, and atrial fibrillation. He has a past medical history significant for lymphoma for which he was getting chemotherapy and failure to thrive. Palliative care has been consulted and the patient is being transferred out of the ICU to the care of [facility] for comfort care.  Today's assessment: Please see H&P (08-Oct-2019) for last physical exam prior to death. Vitals:  Vitals:   10-08-2019 1610 10/08/2019 2106  BP:  (!) 73/53  Pulse: 75 (!) 101  Resp: (!) 33 15  Temp:    SpO2: (!) 85% (!) 61%    The results of significant diagnostics from this hospitalization (including imaging, microbiology, ancillary and laboratory) are listed below for reference.    Significant Diagnostic Studies: DG Abd 1 View  Result Date: 10/08/19 CLINICAL DATA:  Altered mental status.  Vomiting. EXAM: ABDOMEN - 1 VIEW COMPARISON:  Chest x-ray 10-08-19. CT 08/17/2019. KUB 06/15/2019. FINDINGS: Right lateral aspect and lower aspect of the abdomen not completely imaged. PowerPort catheter again noted with tip over right atrium. Bibasilar pulmonary infiltrates. Mild colonic distention. No free air. IMPRESSION: 1.  Bibasilar pulmonary atelectasis/infiltrates. 2.  Mild colonic distention.  No  free air. Electronically Signed   By: Marcello Moores  Register   On: 09/20/2019 08:37   DG CHEST PORT 1 VIEW  Result Date: 09/20/2019 CLINICAL DATA:  Aspiration EXAM: PORTABLE CHEST 1 VIEW COMPARISON:  09/17/2019 FINDINGS: Single frontal view of the chest demonstrates stable left internal jugular catheter and right chest  wall port. The cardiac silhouette is stable. Diffuse bilateral airspace disease, with increased consolidation at the right lung base. Stable right pleural effusion. No pneumothorax. IMPRESSION: 1. Continued bilateral airspace disease, with marked increased consolidation at the right lung base. This could reflect progressive pneumonia or sequela of aspiration. Electronically Signed   By: Randa Ngo M.D.   On: 09/20/2019 03:38   DG CHEST PORT 1 VIEW  Result Date: 09/17/2019 CLINICAL DATA:  Left central line placement. EXAM: PORTABLE CHEST 1 VIEW COMPARISON:  08/30/2019 FINDINGS: 0925 hours. Cardiopericardial silhouette is at upper limits of normal for size. The patchy bilateral airspace disease, right greater than left, similar to prior. Small right pleural effusion again noted. Right Port-A-Cath remains in place. New left IJ central line tip overlies the proximal SVC level. Bones are diffusely demineralized. Telemetry leads overlie the chest. IMPRESSION: 1. New left IJ central line tip overlies the proximal SVC level. No pneumothorax. 2. Persistent patchy bilateral airspace disease, right greater than left. Electronically Signed   By: Misty Stanley M.D.   On: 09/17/2019 09:37   DG Chest Port 1 View  Result Date: 08/30/2019 CLINICAL DATA:  Fevers and altered mental status. Patient receiving active chemo treatments. EXAM: PORTABLE CHEST 1 VIEW COMPARISON:  Chest radiograph 09/03/2019 FINDINGS: Stable cardiomediastinal contours. Right chest Port-A-Cath remains in place. There are increased airspace opacities throughout the right lung. Small right pleural effusion. No new focal opacity in the left lung. No pneumothorax. No acute finding in the visualized skeleton. IMPRESSION: Increased airspace opacities throughout the right lung suspicious for infection or possibly asymmetric edema. Small right pleural effusion. Electronically Signed   By: Audie Pinto M.D.   On: 08/26/2019 20:50   DG CHEST PORT 1  VIEW  Result Date: 09/03/2019 CLINICAL DATA:  Status post thoracentesis. EXAM: PORTABLE CHEST 1 VIEW COMPARISON:  09/03/2019 FINDINGS: Interval near complete evacuation of the right pleural fluid collection. No postprocedural pneumothorax is identified. Persistent underlying emphysematous changes, mild interstitial edema and patchy areas of atelectasis. Small left pleural effusion persists. IMPRESSION: Status post right thoracentesis with near complete evacuation of the right pleural fluid collection. No postprocedural pneumothorax. Electronically Signed   By: Marijo Sanes M.D.   On: 09/03/2019 14:58   DG CHEST PORT 1 VIEW  Result Date: 09/03/2019 CLINICAL DATA:  Pneumonia EXAM: PORTABLE CHEST 1 VIEW COMPARISON:  08/31/2019 FINDINGS: Cardiomegaly with moderate interstitial edema. Moderate right and small left pleural effusions. No pneumothorax. Additional chronic scarring in the bilateral upper lobes. Right chest power port terminates cavoatrial junction. IMPRESSION: Cardiomegaly with moderate interstitial edema. Moderate right and small left pleural effusions. Electronically Signed   By: Julian Hy M.D.   On: 09/03/2019 09:51   DG CHEST PORT 1 VIEW  Result Date: 08/31/2019 CLINICAL DATA:  Acute respiratory distress EXAM: PORTABLE CHEST 1 VIEW COMPARISON:  08/28/2019 FINDINGS: Lung volumes are small and pulmonary insufflation has diminished since prior examination. Large right and at least small left pleural effusions are present, enlarged since prior examination. There is progressive largely perihilar interstitial infiltrate noted within the aerated lungs bilaterally, most in keeping with progressive pulmonary edema, possibly noncardiogenic in nature, though infectious or inflammatory infiltrate could  appear similarly. No pneumothorax. Right internal jugular chest port is in place with its tip within the right atrium. Cardiac size within normal limits. No acute bone abnormality. IMPRESSION: 1.  Interval increase in size of bilateral pleural effusions, large right and at least small left. 2. Progressive bilateral perihilar interstitial infiltrates, most in keeping with progressive pulmonary edema, though infectious or inflammatory infiltrate could appear similarly. 3. Diminished lung volumes. Electronically Signed   By: Fidela Salisbury MD   On: 08/31/2019 21:06   DG Tibia/Fibula Left Port  Result Date: 09/17/2019 CLINICAL DATA:  Redness and swelling to the left lower leg. Clinical concern for cellulitis. EXAM: PORTABLE LEFT TIBIA AND FIBULA - 2 VIEW COMPARISON:  None. FINDINGS: No fracture. No evidence for subluxation or dislocation. No worrisome lytic or sclerotic osseous abnormality. No gas within the visualized soft tissues. IMPRESSION: Negative. Electronically Signed   By: Misty Stanley M.D.   On: 09/17/2019 06:11   DG Foot 2 Views Left  Result Date: 09/17/2019 CLINICAL DATA:  Redness and swelling to the left lower leg. EXAM: LEFT FOOT - 2 VIEW COMPARISON:  No comparison studies available. FINDINGS: Two views study shows soft tissue swelling in the region of the midfoot. No fracture or evidence of dislocation. No worrisome lytic or sclerotic osseous abnormality. No evidence for soft tissue gas. IMPRESSION: Soft tissue swelling without evidence for bony destruction or soft tissue gas. Electronically Signed   By: Misty Stanley M.D.   On: 09/17/2019 06:10    Microbiology: No results found for this or any previous visit (from the past 240 hour(s)).   Labs: Basic Metabolic Panel: No results for input(s): NA, K, CL, CO2, GLUCOSE, BUN, CREATININE, CALCIUM, MG, PHOS in the last 168 hours. Liver Function Tests: No results for input(s): AST, ALT, ALKPHOS, BILITOT, PROT, ALBUMIN in the last 168 hours. No results for input(s): LIPASE, AMYLASE in the last 168 hours. No results for input(s): AMMONIA in the last 168 hours. CBC: No results for input(s): WBC, NEUTROABS, HGB, HCT, MCV, PLT in the  last 168 hours. Cardiac Enzymes: No results for input(s): CKTOTAL, CKMB, CKMBINDEX, TROPONINI in the last 168 hours. BNP: BNP (last 3 results) Recent Labs    07/14/19 0500 07/16/19 0629 09/11/2019 2006  BNP 608.8* 675.9* 753.3*    ProBNP (last 3 results) No results for input(s): PROBNP in the last 8760 hours.  CBG: No results for input(s): GLUCAP in the last 168 hours.  Principal Problem:   Hospital-acquired pneumonia Active Problems:   Temporal lobe epilepsy ([place])   Hyperlipidemia with target LDL less than 70   Pressure injury of skin   Protein-calorie malnutrition, severe   Persistent atrial fibrillation (H[facility])   DVT (deep venous thrombosis) (HCC)   AKI (acute kidney injury) (HCC)   Lactic acidosis   Acute on chronic respiratory failure with hypoxia (HCC)   Immunocompromised patient ([place])   Diarrhea   Septic shock ([place])   Palliative care by specialist   Goals of care, counseling/discussion   Aspiration into airway   Pain   Sepsis with encephalopathy and septic shock ([place])   Vomiting   Terminal care   Shortness of breath   Concern about end of life  Time coordinating discharge: 38 minutes.  Signed:        Leldon Steege, DO [facility]  09/28/2019, 1:04 PM

## 2019-10-19 NOTE — Progress Notes (Signed)
Events of last few days noted.  He has been transitioned to comfort care which is reasonable and very appropriate from an oncology standpoint despite his response to treatment.  The likelihood of meaningful recovery to resume anticancer treatment is very low.

## 2019-10-19 NOTE — Progress Notes (Signed)
Nutrition Brief Note  Chart reviewed. Pt now transitioning to comfort care.  No further nutrition interventions warranted at this time.  Please re-consult as needed.   Tonna Palazzi W, RD, LDN, CDCES Registered Dietitian II Certified Diabetes Care and Education Specialist Please refer to AMION for RD and/or RD on-call/weekend/after hours pager  

## 2019-10-19 NOTE — Progress Notes (Signed)
Patient checked and displayed Cheyne Stokes breathing pattern. Pulse weak. Family at the bedside.  0800- Patient Checked, No pulse or breathing noted. Will verify with second nurse.

## 2019-10-19 DEATH — deceased

## 2020-09-30 IMAGING — DX DG CHEST 1V PORT
2 series · 2 of 2 positions shown · non-contrast
Comparison: Radiograph 06/18/2019

CLINICAL DATA: Respiratory failure

EXAM:
PORTABLE CHEST 1 VIEW

[chest ap (1 of 2)]
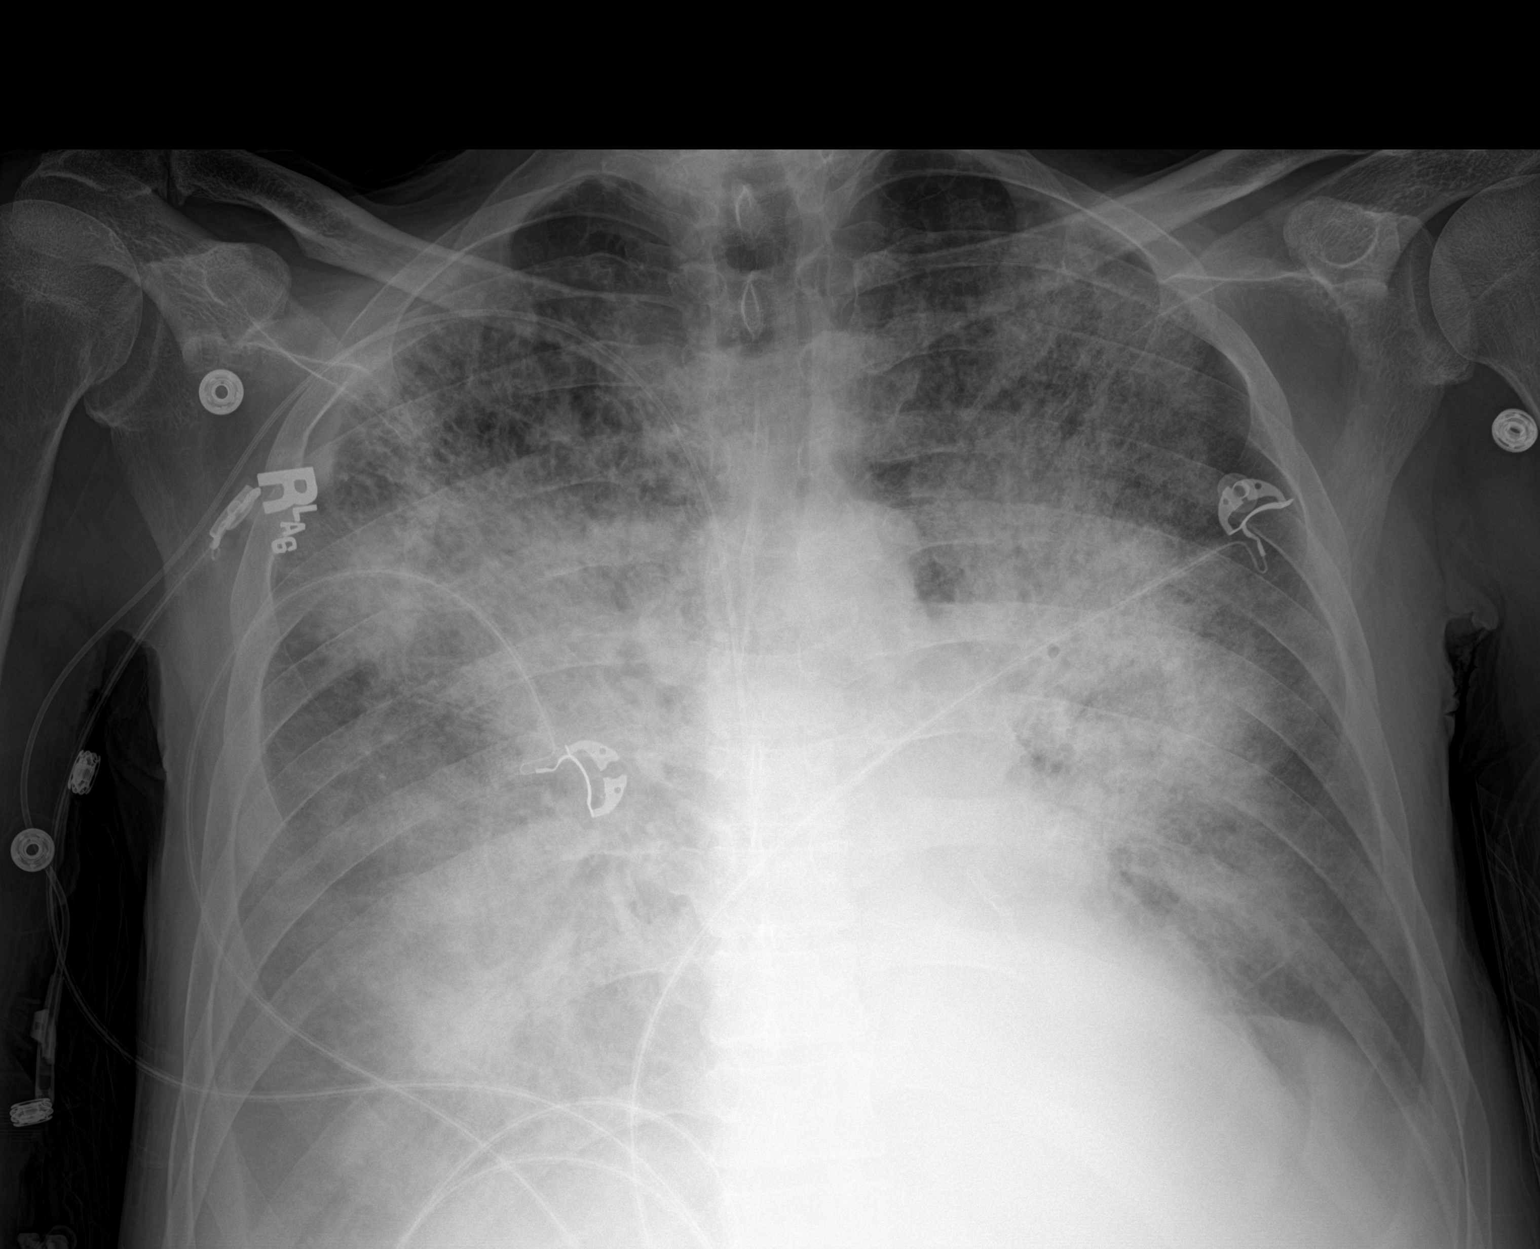

[chest ap (2 of 2)]
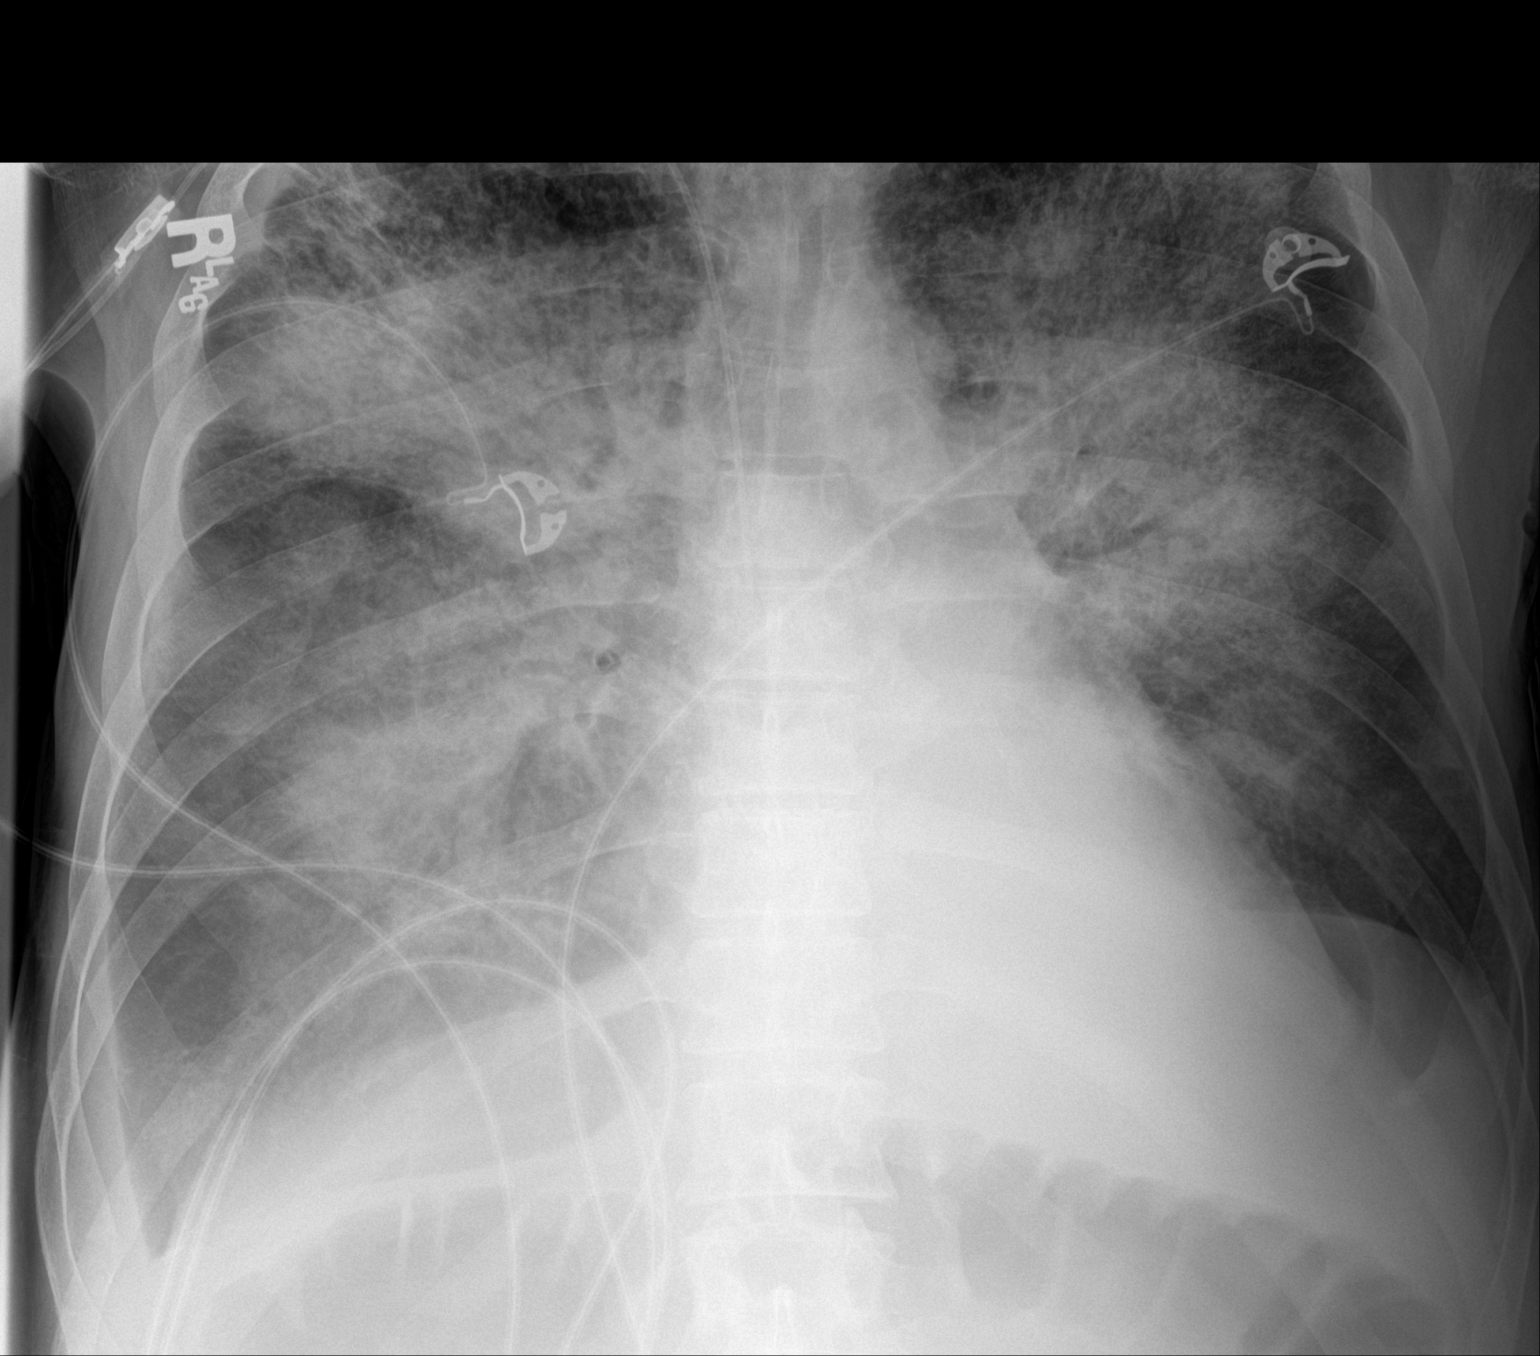

[2 of 2 positions shown; findings below may reference images not displayed]

FINDINGS: Right upper extremity PICC tip terminates at the superior cavoatrial
junction. Telemetry leads overlie the chest. Coronary stent projects
over the heart.

Persistent bilateral dense heterogeneous airspace opacities are
slightly improved in the right lower lung though essentially
unchanged elsewhere. No pneumothorax. Small left and likely moderate
right pleural effusion. No pneumothorax. Unchanged cardiomediastinal
contours. No acute osseous or soft tissue abnormality. Degenerative
changes are present in the imaged spine and shoulders.
IMPRESSION: Persistent bilateral dense heterogeneous airspace opacities,
slightly improved in the right lower lung though essentially
unchanged elsewhere. Findings could reflect infection or edema.

## 2020-12-05 IMAGING — DX DG FOOT 2V*L*
2 series · 2 of 2 positions shown · non-contrast
Comparison: No comparison studies available.

CLINICAL DATA: Redness and swelling to the left lower leg.

EXAM:
LEFT FOOT - 2 VIEW

[foot]
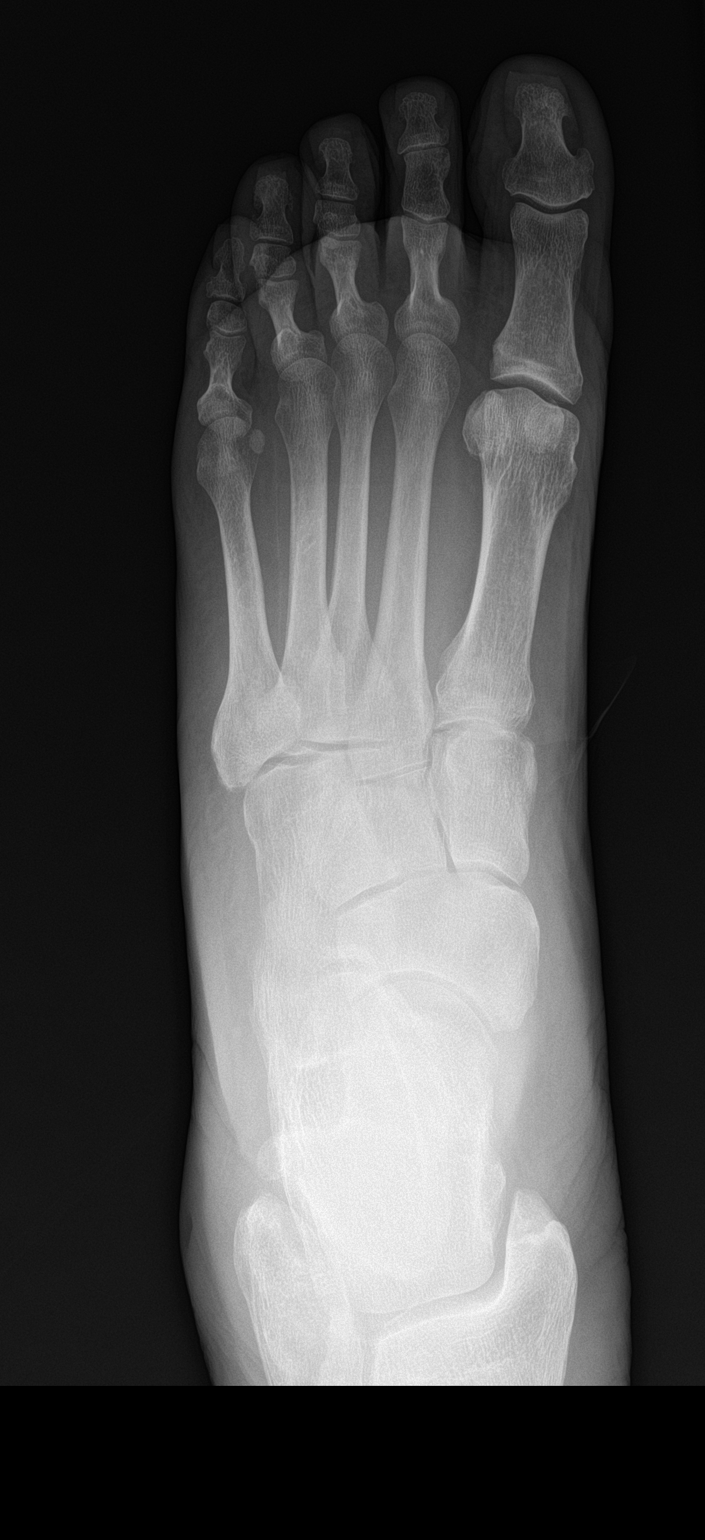

[leg]
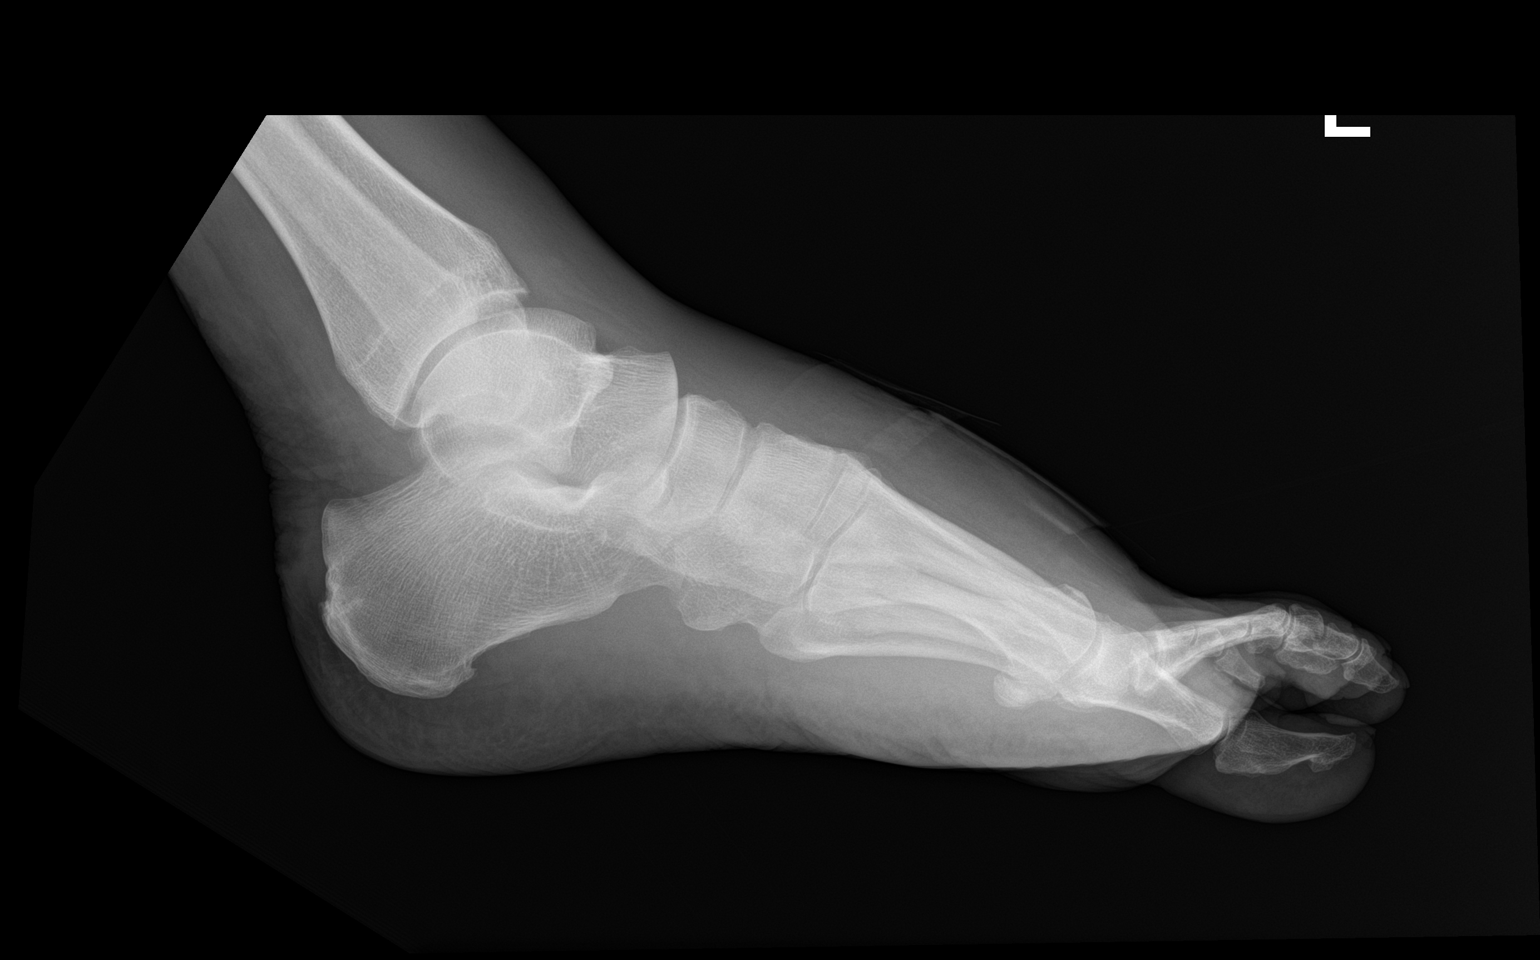

[2 of 2 positions shown; findings below may reference images not displayed]

FINDINGS: Two views study shows soft tissue swelling in the region of the
midfoot. No fracture or evidence of dislocation. No worrisome lytic
or sclerotic osseous abnormality. No evidence for soft tissue gas.
IMPRESSION: Soft tissue swelling without evidence for bony destruction or soft
tissue gas.

## 2020-12-08 IMAGING — DX DG ABDOMEN 1V
1 series · 1 of 1 positions shown · non-contrast
Comparison: Chest x-ray 09/20/2019. CT 08/17/2019. KUB 06/15/2019.

CLINICAL DATA: Altered mental status.  Vomiting.

EXAM:
ABDOMEN - 1 VIEW

[abdomen kub]
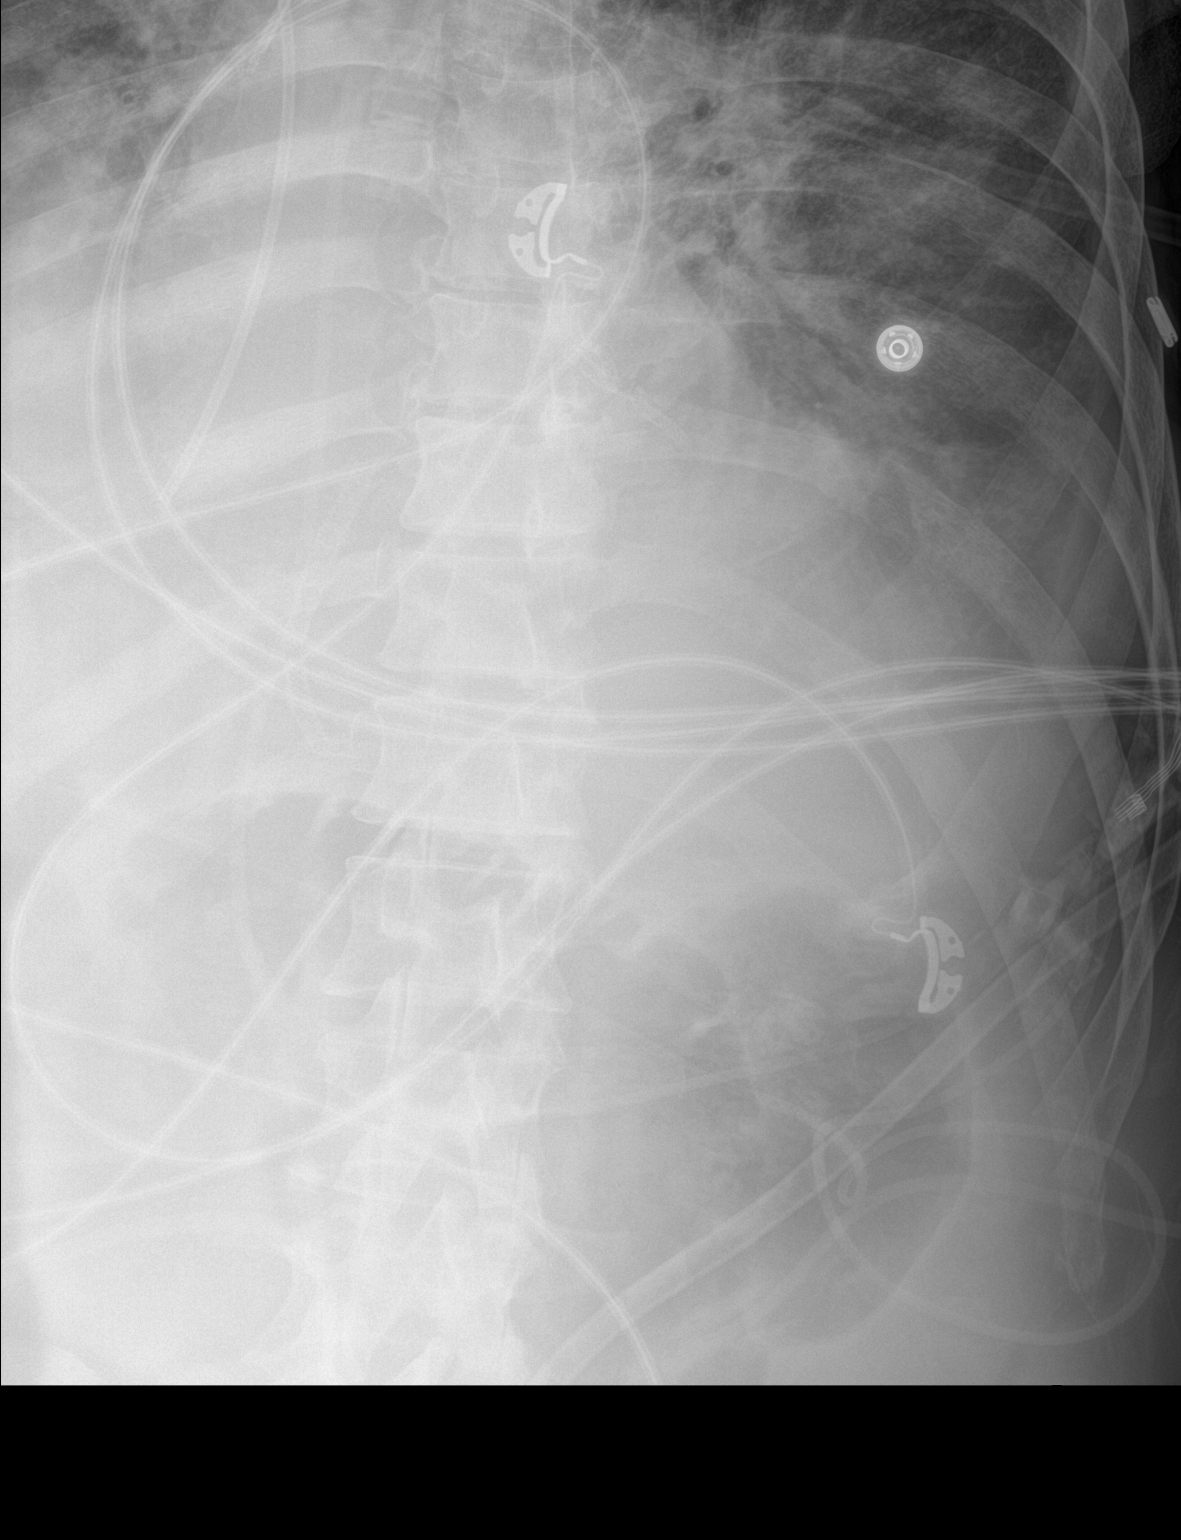

[1 of 1 positions shown; findings below may reference images not displayed]

FINDINGS: Right lateral aspect and lower aspect of the abdomen not completely
imaged. PowerPort catheter again noted with tip over right atrium.
Bibasilar pulmonary infiltrates. Mild colonic distention. No free
air.
IMPRESSION: 1.  Bibasilar pulmonary atelectasis/infiltrates.

2.  Mild colonic distention.  No free air.

## 2022-11-02 NOTE — Telephone Encounter (Signed)
TC
